# Patient Record
Sex: Female | Born: 1970 | Race: Black or African American | Hispanic: No | Marital: Single | Smoking: Current some day smoker
Health system: Southern US, Community
[De-identification: ages and names within clinical notes are randomized; demographics above are authoritative.]

## PROBLEM LIST (undated history)

## (undated) DIAGNOSIS — T8859XA Other complications of anesthesia, initial encounter: Secondary | ICD-10-CM

## (undated) DIAGNOSIS — F329 Major depressive disorder, single episode, unspecified: Secondary | ICD-10-CM

## (undated) DIAGNOSIS — R011 Cardiac murmur, unspecified: Secondary | ICD-10-CM

## (undated) DIAGNOSIS — IMO0002 Reserved for concepts with insufficient information to code with codable children: Secondary | ICD-10-CM

## (undated) DIAGNOSIS — K219 Gastro-esophageal reflux disease without esophagitis: Secondary | ICD-10-CM

## (undated) DIAGNOSIS — I10 Essential (primary) hypertension: Secondary | ICD-10-CM

## (undated) DIAGNOSIS — C50919 Malignant neoplasm of unspecified site of unspecified female breast: Secondary | ICD-10-CM

## (undated) DIAGNOSIS — C73 Malignant neoplasm of thyroid gland: Secondary | ICD-10-CM

## (undated) DIAGNOSIS — J40 Bronchitis, not specified as acute or chronic: Secondary | ICD-10-CM

## (undated) DIAGNOSIS — D649 Anemia, unspecified: Secondary | ICD-10-CM

## (undated) DIAGNOSIS — D869 Sarcoidosis, unspecified: Secondary | ICD-10-CM

## (undated) DIAGNOSIS — E119 Type 2 diabetes mellitus without complications: Secondary | ICD-10-CM

## (undated) DIAGNOSIS — E7800 Pure hypercholesterolemia, unspecified: Secondary | ICD-10-CM

## (undated) DIAGNOSIS — F419 Anxiety disorder, unspecified: Secondary | ICD-10-CM

## (undated) DIAGNOSIS — T4145XA Adverse effect of unspecified anesthetic, initial encounter: Secondary | ICD-10-CM

## (undated) DIAGNOSIS — IMO0001 Reserved for inherently not codable concepts without codable children: Secondary | ICD-10-CM

## (undated) DIAGNOSIS — F32A Depression, unspecified: Secondary | ICD-10-CM

## (undated) HISTORY — DX: Depression, unspecified: F32.A

## (undated) HISTORY — DX: Anemia, unspecified: D64.9

## (undated) HISTORY — PX: LEEP: SHX91

## (undated) HISTORY — PX: WISDOM TOOTH EXTRACTION: SHX21

## (undated) HISTORY — DX: Major depressive disorder, single episode, unspecified: F32.9

## (undated) HISTORY — DX: Reserved for concepts with insufficient information to code with codable children: IMO0002

## (undated) HISTORY — DX: Reserved for inherently not codable concepts without codable children: IMO0001

## (undated) HISTORY — PX: ABDOMINAL HYSTERECTOMY: SHX81

## (undated) HISTORY — PX: BREAST BIOPSY: SHX20

## (undated) HISTORY — DX: Anxiety disorder, unspecified: F41.9

## (undated) HISTORY — DX: Sarcoidosis, unspecified: D86.9

---

## 1998-02-24 ENCOUNTER — Inpatient Hospital Stay (HOSPITAL_COMMUNITY): Admission: AD | Admit: 1998-02-24 | Discharge: 1998-02-24 | Payer: Self-pay | Admitting: Obstetrics

## 1998-02-25 ENCOUNTER — Ambulatory Visit (HOSPITAL_COMMUNITY): Admission: RE | Admit: 1998-02-25 | Discharge: 1998-02-25 | Payer: Self-pay | Admitting: *Deleted

## 1999-06-06 HISTORY — PX: TUBAL LIGATION: SHX77

## 2000-01-17 ENCOUNTER — Other Ambulatory Visit: Admission: RE | Admit: 2000-01-17 | Discharge: 2000-01-17 | Payer: Self-pay | Admitting: *Deleted

## 2000-06-28 ENCOUNTER — Ambulatory Visit (HOSPITAL_COMMUNITY): Admission: RE | Admit: 2000-06-28 | Discharge: 2000-06-28 | Payer: Self-pay | Admitting: *Deleted

## 2001-02-13 ENCOUNTER — Other Ambulatory Visit: Admission: RE | Admit: 2001-02-13 | Discharge: 2001-02-13 | Payer: Self-pay | Admitting: Family Medicine

## 2001-07-28 ENCOUNTER — Encounter: Payer: Self-pay | Admitting: Emergency Medicine

## 2001-07-28 ENCOUNTER — Emergency Department (HOSPITAL_COMMUNITY): Admission: EM | Admit: 2001-07-28 | Discharge: 2001-07-28 | Payer: Self-pay | Admitting: Emergency Medicine

## 2003-01-23 ENCOUNTER — Emergency Department (HOSPITAL_COMMUNITY): Admission: EM | Admit: 2003-01-23 | Discharge: 2003-01-23 | Payer: Self-pay | Admitting: Emergency Medicine

## 2003-01-23 ENCOUNTER — Encounter: Payer: Self-pay | Admitting: Emergency Medicine

## 2003-07-18 ENCOUNTER — Other Ambulatory Visit: Admission: RE | Admit: 2003-07-18 | Discharge: 2003-07-18 | Payer: Self-pay | Admitting: Family Medicine

## 2004-02-25 ENCOUNTER — Ambulatory Visit: Payer: Self-pay | Admitting: Family Medicine

## 2004-02-26 ENCOUNTER — Ambulatory Visit: Payer: Self-pay | Admitting: Family Medicine

## 2004-03-03 ENCOUNTER — Ambulatory Visit: Payer: Self-pay | Admitting: Family Medicine

## 2004-03-11 ENCOUNTER — Ambulatory Visit: Payer: Self-pay | Admitting: *Deleted

## 2004-06-05 ENCOUNTER — Emergency Department (HOSPITAL_COMMUNITY): Admission: EM | Admit: 2004-06-05 | Discharge: 2004-06-05 | Payer: Self-pay | Admitting: Emergency Medicine

## 2004-06-10 ENCOUNTER — Ambulatory Visit: Payer: Self-pay | Admitting: Family Medicine

## 2004-08-19 ENCOUNTER — Ambulatory Visit: Payer: Self-pay | Admitting: Family Medicine

## 2004-09-13 ENCOUNTER — Ambulatory Visit: Payer: Self-pay | Admitting: Family Medicine

## 2005-04-05 ENCOUNTER — Emergency Department (HOSPITAL_COMMUNITY): Admission: EM | Admit: 2005-04-05 | Discharge: 2005-04-05 | Payer: Self-pay | Admitting: Family Medicine

## 2005-04-05 ENCOUNTER — Ambulatory Visit: Payer: Self-pay | Admitting: Family Medicine

## 2005-04-06 ENCOUNTER — Ambulatory Visit: Payer: Self-pay | Admitting: Family Medicine

## 2005-07-27 ENCOUNTER — Ambulatory Visit: Payer: Self-pay | Admitting: Family Medicine

## 2005-08-30 ENCOUNTER — Ambulatory Visit: Payer: Self-pay | Admitting: Family Medicine

## 2006-01-29 ENCOUNTER — Ambulatory Visit: Payer: Self-pay | Admitting: Family Medicine

## 2006-09-23 ENCOUNTER — Emergency Department (HOSPITAL_COMMUNITY): Admission: EM | Admit: 2006-09-23 | Discharge: 2006-09-23 | Payer: Self-pay | Admitting: Family Medicine

## 2006-10-26 ENCOUNTER — Ambulatory Visit: Payer: Self-pay | Admitting: Internal Medicine

## 2006-10-31 ENCOUNTER — Ambulatory Visit: Payer: Self-pay | Admitting: Family Medicine

## 2007-02-20 ENCOUNTER — Encounter (INDEPENDENT_AMBULATORY_CARE_PROVIDER_SITE_OTHER): Payer: Self-pay | Admitting: *Deleted

## 2007-04-08 ENCOUNTER — Encounter (INDEPENDENT_AMBULATORY_CARE_PROVIDER_SITE_OTHER): Payer: Self-pay | Admitting: Family Medicine

## 2007-04-08 ENCOUNTER — Ambulatory Visit: Payer: Self-pay | Admitting: Internal Medicine

## 2007-04-08 LAB — CONVERTED CEMR LAB
Alkaline Phosphatase: 67 units/L (ref 39–117)
BUN: 17 mg/dL (ref 6–23)
CO2: 23 meq/L (ref 19–32)
Cholesterol: 211 mg/dL — ABNORMAL HIGH (ref 0–200)
Creatinine, Ser: 0.97 mg/dL (ref 0.40–1.20)
Eosinophils Absolute: 0.3 10*3/uL (ref 0.0–0.7)
Eosinophils Relative: 5 % (ref 0–5)
Glucose, Bld: 92 mg/dL (ref 70–99)
HCT: 40.3 % (ref 36.0–46.0)
HDL: 38 mg/dL — ABNORMAL LOW (ref >39)
Hemoglobin: 13.3 g/dL (ref 12.0–15.0)
LDL Cholesterol: 137 mg/dL — ABNORMAL HIGH (ref 0–99)
Lymphs Abs: 1.7 10*3/uL (ref 0.7–3.3)
MCV: 88.8 fL (ref 78.0–100.0)
Monocytes Absolute: 0.6 10*3/uL (ref 0.2–0.7)
Monocytes Relative: 11 % (ref 3–11)
Platelets: 277 10*3/uL (ref 150–400)
RBC: 4.54 M/uL (ref 3.87–5.11)
Total Bilirubin: 0.4 mg/dL (ref 0.3–1.2)
Triglycerides: 180 mg/dL — ABNORMAL HIGH (ref <150)
VLDL: 36 mg/dL (ref 0–40)
WBC: 5.6 10*3/uL (ref 4.0–10.5)

## 2007-11-26 ENCOUNTER — Ambulatory Visit: Payer: Self-pay | Admitting: Internal Medicine

## 2008-03-12 ENCOUNTER — Ambulatory Visit: Payer: Self-pay | Admitting: Internal Medicine

## 2008-09-25 ENCOUNTER — Ambulatory Visit: Payer: Self-pay | Admitting: Internal Medicine

## 2008-11-04 ENCOUNTER — Ambulatory Visit: Payer: Self-pay | Admitting: Internal Medicine

## 2009-03-31 ENCOUNTER — Encounter (INDEPENDENT_AMBULATORY_CARE_PROVIDER_SITE_OTHER): Payer: Self-pay | Admitting: Adult Health

## 2009-03-31 ENCOUNTER — Ambulatory Visit: Payer: Self-pay | Admitting: Internal Medicine

## 2009-03-31 LAB — CONVERTED CEMR LAB
AST: 22 units/L (ref 0–37)
Albumin: 4.2 g/dL (ref 3.5–5.2)
BUN: 15 mg/dL (ref 6–23)
Calcium: 9.4 mg/dL (ref 8.4–10.5)
Chloride: 101 meq/L (ref 96–112)
Cholesterol: 157 mg/dL (ref 0–200)
Creatinine, Ser: 0.95 mg/dL (ref 0.40–1.20)
Eosinophils Absolute: 0.3 10*3/uL (ref 0.0–0.7)
Eosinophils Relative: 4 % (ref 0–5)
Glucose, Bld: 81 mg/dL (ref 70–99)
HCT: 34.1 % — ABNORMAL LOW (ref 36.0–46.0)
HDL: 42 mg/dL (ref >39)
Hemoglobin: 11.2 g/dL — ABNORMAL LOW (ref 12.0–15.0)
Lymphocytes Relative: 35 % (ref 12–46)
Lymphs Abs: 2.1 10*3/uL (ref 0.7–4.0)
MCV: 89.5 fL (ref 78.0–100.0)
Monocytes Absolute: 0.5 10*3/uL (ref 0.1–1.0)
Platelets: 296 10*3/uL (ref 150–400)
Potassium: 3.5 meq/L (ref 3.5–5.3)
RDW: 13.4 % (ref 11.5–15.5)
Total CHOL/HDL Ratio: 3.7
Triglycerides: 125 mg/dL (ref <150)

## 2009-04-08 ENCOUNTER — Ambulatory Visit (HOSPITAL_COMMUNITY): Admission: RE | Admit: 2009-04-08 | Discharge: 2009-04-08 | Payer: Self-pay | Admitting: Internal Medicine

## 2009-04-16 ENCOUNTER — Ambulatory Visit: Payer: Self-pay | Admitting: Internal Medicine

## 2009-05-24 ENCOUNTER — Emergency Department (HOSPITAL_COMMUNITY): Admission: EM | Admit: 2009-05-24 | Discharge: 2009-05-24 | Payer: Self-pay | Admitting: Emergency Medicine

## 2009-07-01 ENCOUNTER — Ambulatory Visit: Payer: Self-pay | Admitting: Internal Medicine

## 2009-07-12 ENCOUNTER — Telehealth (INDEPENDENT_AMBULATORY_CARE_PROVIDER_SITE_OTHER): Payer: Self-pay | Admitting: *Deleted

## 2009-07-13 ENCOUNTER — Encounter (INDEPENDENT_AMBULATORY_CARE_PROVIDER_SITE_OTHER): Payer: Self-pay | Admitting: *Deleted

## 2009-07-28 ENCOUNTER — Ambulatory Visit: Payer: Self-pay | Admitting: Family Medicine

## 2009-09-28 ENCOUNTER — Ambulatory Visit: Payer: Self-pay | Admitting: Internal Medicine

## 2009-09-28 ENCOUNTER — Encounter (INDEPENDENT_AMBULATORY_CARE_PROVIDER_SITE_OTHER): Payer: Self-pay | Admitting: Adult Health

## 2009-09-28 LAB — CONVERTED CEMR LAB
Alkaline Phosphatase: 54 units/L (ref 39–117)
BUN: 13 mg/dL (ref 6–23)
Basophils Absolute: 0 10*3/uL (ref 0.0–0.1)
Basophils Relative: 1 % (ref 0–1)
CO2: 23 meq/L (ref 19–32)
Cholesterol: 168 mg/dL (ref 0–200)
Eosinophils Absolute: 0.2 10*3/uL (ref 0.0–0.7)
Eosinophils Relative: 3 % (ref 0–5)
Glucose, Bld: 90 mg/dL (ref 70–99)
HCT: 35 % — ABNORMAL LOW (ref 36.0–46.0)
HDL: 54 mg/dL (ref >39)
Hemoglobin: 11 g/dL — ABNORMAL LOW (ref 12.0–15.0)
MCHC: 31.4 g/dL (ref 30.0–36.0)
MCV: 92.6 fL (ref 78.0–100.0)
Monocytes Absolute: 0.5 10*3/uL (ref 0.1–1.0)
Monocytes Relative: 7 % (ref 3–12)
Neutro Abs: 4.1 10*3/uL (ref 1.7–7.7)
RBC: 3.78 M/uL — ABNORMAL LOW (ref 3.87–5.11)
RDW: 14 % (ref 11.5–15.5)
Sodium: 139 meq/L (ref 135–145)
Total Bilirubin: 0.2 mg/dL — ABNORMAL LOW (ref 0.3–1.2)
Total Protein: 7.3 g/dL (ref 6.0–8.3)
Triglycerides: 109 mg/dL (ref <150)
VLDL: 22 mg/dL (ref 0–40)

## 2009-09-30 ENCOUNTER — Ambulatory Visit (HOSPITAL_COMMUNITY): Admission: RE | Admit: 2009-09-30 | Discharge: 2009-09-30 | Payer: Self-pay | Admitting: Family Medicine

## 2010-04-06 ENCOUNTER — Encounter (INDEPENDENT_AMBULATORY_CARE_PROVIDER_SITE_OTHER): Payer: Self-pay | Admitting: *Deleted

## 2010-04-06 LAB — CONVERTED CEMR LAB
ALT: 19 units/L (ref 0–35)
BUN: 13 mg/dL (ref 6–23)
Creatinine, Ser: 0.88 mg/dL (ref 0.40–1.20)
Ferritin: 23 ng/mL (ref 10–291)
HCT: 35.8 % — ABNORMAL LOW (ref 36.0–46.0)
Hemoglobin: 11.4 g/dL — ABNORMAL LOW (ref 12.0–15.0)
Hgb A1c MFr Bld: 6.3 % — ABNORMAL HIGH (ref <5.7)
MCHC: 31.8 g/dL (ref 30.0–36.0)
Platelets: 320 10*3/uL (ref 150–400)
RDW: 13.7 % (ref 11.5–15.5)

## 2010-07-05 NOTE — Progress Notes (Signed)
Summary: triage/poss flu  Phone Note Call from Patient   Caller: Patient Reason for Call: Talk to Nurse Summary of Call: patient states she has been sick with sore throat, headache, nausea. She denies vomiting or diarrhea..She states she has a slight cough that is sometimes productive of white mucus otherwise the cough is dry. She says her throat is pink and she has a hx of diabetes and HTN. Her CBG is 151 and isn't sure about a fever. She is taking Alka Laddie Aquas for her s/s.Marland KitchenMarland KitchenShe feels achy and weak.  This all started on Saturday. She says she hasn't eaten today but is drinking liquids.Marland KitchenMarland KitchenAdvised patient the flu is going around and it sounds like this is a virus possible the flu. She can gargle with warm salt water, and drink plenty of fluids...crackers and soup.Marland KitchenMarland KitchenIf she doesn't feel like eating more than that.Marland KitchenMarland KitchenAdvised her to check her CBG's two times a day as she usually does and to call me tomorrow to let me know how she is doing. Should s/s worsen we will bring her in for an appointment. Initial call taken by: Conchita Paris,  July 12, 2009 6:05 PM     Appended Document: triage/poss flu call from patient, states is not feeling any better. She is unable to check her temp secondary to no thermometer. Advised patient that with influenza, which she is displaying symptoms up, the recovery or improvement period is upwards of 5-7 days, with flu shot (since usually have milder version). No resp distress, continues to have generalized muscle aches, fatigue, cough, congestion, ha, feels as if has fever with chills. Advised to take IBU 400 mg q 6 hours for fever, can alternate with Tylenol. She states that was taking Leota Sauers which was helping with upper resp symptoms, not fever. Advised that Alka [name] has ASA in it, really need to take IBU or Tylenol to help with fever, and IBU will help with muscle aches. Also suggest tsp of honey for throat pain, in addition to gargles and sugar free lozangers.  Advised can use Robatussin sugar free cough syrup. Push fluids, limit contact, good hand hygiene. Advised if continue to feel febrile past 4 days, or coughing and producing colored phlem, call office so can schedule apt. To call with any change in symptoms. Needs note for work, note written to be out through 07/18/09. Note faxed to Saunders Revel or Commeka Arnold's attn at [phone]. 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

## 2010-07-05 NOTE — Letter (Signed)
Summary: Out of Work  [employer]  [address]   [place], [place] [address]   Phone: [phone]  Fax: [phone]    July 13, 2009   Employee:  Shelly Hall    To Whom It May Concern:   For Medical reasons, please excuse the above named employee from work for the following dates:  Start:   07-12-09  End:   07-18-09 (may be extended based on symptoms)  If you need additional information, please feel free to contact our office.         Sincerely,    Blondell Reveal RN

## 2010-08-24 ENCOUNTER — Inpatient Hospital Stay (INDEPENDENT_AMBULATORY_CARE_PROVIDER_SITE_OTHER)
Admission: RE | Admit: 2010-08-24 | Discharge: 2010-08-24 | Disposition: A | Discharge status: Home / Self Care | Payer: Self-pay | Source: Ambulatory Visit | Attending: Family Medicine | Admitting: Family Medicine

## 2010-08-24 ENCOUNTER — Ambulatory Visit (INDEPENDENT_AMBULATORY_CARE_PROVIDER_SITE_OTHER): Payer: Self-pay

## 2010-08-24 DIAGNOSIS — J40 Bronchitis, not specified as acute or chronic: Secondary | ICD-10-CM

## 2010-10-21 NOTE — Op Note (Signed)
[facility]  Patient:    Shelly Hall, Shelly Hall                   MRN: [id number] Proc. Date: 06/28/00 Attending:  Deniece Ree, M.D.                           Operative Report  PREOPERATIVE DIAGNOSES:       Multiparity, desire for permanent sterilization.  POSTOPERATIVE DIAGNOSES:      Multiparity, desire for permanent sterilization.  OPERATION:                    Laparoscopic bilateral tubal cauterization with tubal resection.  SURGEON:                      Deniece Ree, M.D.  ANESTHESIA:                   General.  ESTIMATED BLOOD LOSS:         Less than 25 cc.  COMPLICATIONS:                None.                                Patient tolerated the procedure well and returned to the recovery room in satisfactory condition.  PROCEDURE:                    Patient was taken to the operating room and prepped and draped in the usual fashion for laparoscopic surgery.  Speculum was placed in the vagina following which the anterior lip of the cervix was grasped with a Christella Hartigan tenaculum.  The acorn uterine manipulating cannula then put into place.  A subumbilical incision was then made following which the Verres needle was introduced in this incision and approximately 3 L of carbon dioxide was introduced without any difficulty.  The laparoscopic Trocar was then used to enter the abdominal cavity following which the laparoscope was placed through its sleeve.  Visualization of the pelvic organs came into view.  Uterus, tubes, and ovaries all appeared to be within normal limits. The right tube was then identified, grasped approximately 5 mm proximal to the right cornu and cauterized approximately 3-4 cm.  This was done likewise on the opposite size.  Following this cauterized segments of tube were then cut into two locations.  At this point hemostasis remained present.  Sponge and needle count was correct x 2.  The carbon dioxide was then allowed to  escape from the abdominal cavity ______ without any problems.  The incision was then closed, first with a deep interrupted stitch followed by a subcuticular stitch using 4-0 Vicryl.  Procedure then terminated.  Patient tolerated procedure well and returned to the recovery room in satisfactory condition.  Patient is to be discharged when fully alert.  She has been instructed on the possible complications and care following this type of surgery.  She is told to return to my office in four weeks for followup evaluation or to call me prior to that time should any problems arise. DD:  06/28/00 TD:  06/28/00 Job: [id number]

## 2011-02-19 ENCOUNTER — Emergency Department (HOSPITAL_COMMUNITY): Payer: Self-pay

## 2011-02-19 ENCOUNTER — Emergency Department (HOSPITAL_COMMUNITY)
Admission: EM | Admit: 2011-02-19 | Discharge: 2011-02-19 | Disposition: A | Discharge status: Home / Self Care | Payer: Self-pay | Attending: Emergency Medicine | Admitting: Emergency Medicine

## 2011-02-19 DIAGNOSIS — T1490XA Injury, unspecified, initial encounter: Secondary | ICD-10-CM | POA: Insufficient documentation

## 2011-02-19 DIAGNOSIS — S5010XA Contusion of unspecified forearm, initial encounter: Secondary | ICD-10-CM | POA: Insufficient documentation

## 2011-02-19 DIAGNOSIS — R220 Localized swelling, mass and lump, head: Secondary | ICD-10-CM | POA: Insufficient documentation

## 2011-02-19 DIAGNOSIS — I10 Essential (primary) hypertension: Secondary | ICD-10-CM | POA: Insufficient documentation

## 2011-02-19 DIAGNOSIS — E119 Type 2 diabetes mellitus without complications: Secondary | ICD-10-CM | POA: Insufficient documentation

## 2012-01-03 ENCOUNTER — Encounter (HOSPITAL_COMMUNITY): Payer: Self-pay | Admitting: Emergency Medicine

## 2012-01-03 ENCOUNTER — Other Ambulatory Visit: Payer: Self-pay

## 2012-01-03 DIAGNOSIS — I10 Essential (primary) hypertension: Secondary | ICD-10-CM | POA: Insufficient documentation

## 2012-01-03 DIAGNOSIS — F17200 Nicotine dependence, unspecified, uncomplicated: Secondary | ICD-10-CM | POA: Insufficient documentation

## 2012-01-03 DIAGNOSIS — E876 Hypokalemia: Secondary | ICD-10-CM | POA: Insufficient documentation

## 2012-01-03 DIAGNOSIS — E119 Type 2 diabetes mellitus without complications: Secondary | ICD-10-CM | POA: Insufficient documentation

## 2012-01-03 LAB — CBC WITH DIFFERENTIAL/PLATELET
Basophils Absolute: 0 10*3/uL (ref 0.0–0.1)
Basophils Relative: 0 % (ref 0–1)
Eosinophils Absolute: 0.3 10*3/uL (ref 0.0–0.7)
MCH: 27.6 pg (ref 26.0–34.0)
MCHC: 32.6 g/dL (ref 30.0–36.0)
Monocytes Relative: 8 % (ref 3–12)
Neutrophils Relative %: 57 % (ref 43–77)
Platelets: 354 10*3/uL (ref 150–400)
RDW: 15.5 % (ref 11.5–15.5)

## 2012-01-03 LAB — BASIC METABOLIC PANEL
BUN: 14 mg/dL (ref 6–23)
Calcium: 9.3 mg/dL (ref 8.4–10.5)
Creatinine, Ser: 1.05 mg/dL (ref 0.50–1.10)
GFR calc non Af Amer: 65 mL/min — ABNORMAL LOW (ref >90)
Glucose, Bld: 110 mg/dL — ABNORMAL HIGH (ref 70–99)

## 2012-01-03 LAB — GLUCOSE, CAPILLARY: Glucose-Capillary: 106 mg/dL — ABNORMAL HIGH (ref 70–99)

## 2012-01-03 NOTE — ED Notes (Signed)
BP reduced only slightly.  Pt states she continues to feel dizzy.

## 2012-01-03 NOTE — ED Notes (Signed)
PT. REPORTS LOW BLOOD SUGAR AT HOME THIS EVENING WITH GENERALIZED WEAKNESS / DIZZINESS.

## 2012-01-04 ENCOUNTER — Emergency Department (HOSPITAL_COMMUNITY)
Admission: EM | Admit: 2012-01-04 | Discharge: 2012-01-04 | Disposition: A | Discharge status: Home / Self Care | Payer: Self-pay | Attending: Emergency Medicine | Admitting: Emergency Medicine

## 2012-01-04 DIAGNOSIS — E876 Hypokalemia: Secondary | ICD-10-CM

## 2012-01-04 DIAGNOSIS — I160 Hypertensive urgency: Secondary | ICD-10-CM

## 2012-01-04 HISTORY — DX: Essential (primary) hypertension: I10

## 2012-01-04 LAB — GLUCOSE, CAPILLARY: Glucose-Capillary: 98 mg/dL (ref 70–99)

## 2012-01-04 LAB — URINE MICROSCOPIC-ADD ON

## 2012-01-04 LAB — URINALYSIS, ROUTINE W REFLEX MICROSCOPIC
Bilirubin Urine: NEGATIVE
Ketones, ur: NEGATIVE mg/dL
Nitrite: NEGATIVE
pH: 6.5 (ref 5.0–8.0)

## 2012-01-04 LAB — LIPASE, BLOOD: Lipase: 35 U/L (ref 11–59)

## 2012-01-04 LAB — HEPATIC FUNCTION PANEL
Albumin: 4 g/dL (ref 3.5–5.2)
Alkaline Phosphatase: 77 U/L (ref 39–117)
Total Bilirubin: 0.1 mg/dL — ABNORMAL LOW (ref 0.3–1.2)

## 2012-01-04 MED ORDER — ATENOLOL 50 MG PO TABS: 50.0000 mg | ORAL_TABLET | Freq: Every day | ORAL | Status: DC

## 2012-01-04 MED ORDER — LABETALOL HCL 5 MG/ML IV SOLN
20.0000 mg | Freq: Once | INTRAVENOUS | Status: AC
  Administered 2012-01-04: 20 mg via INTRAVENOUS
  Filled 2012-01-04: qty 4

## 2012-01-04 NOTE — ED Notes (Signed)
The patient is AOx4 and comfortable with her discharge instructions. 

## 2012-01-04 NOTE — ED Provider Notes (Signed)
History     CSN: [id number]  Arrival date & time 01/03/12  2123   First MD Initiated Contact with Patient 01/04/12 0102      Chief Complaint  Patient presents with  . Hypoglycemia    HPI  This 41 year old female presents with multiple complaints.  She notes over the past days to a week she has felt generally unwell, with migratory areas of concern.  She Associates intermittently had headache, abdominal pain, chest pain, dyspnea, extremity discomfort, nausea, disequilibrium.  Today, after awakening, feeling as though her symptoms were worsening, and she was more fatigued, she was concerned about her low blood sugar, and now presents for evaluation. Throughout this time.  She has continued to take her medication, except atenolol.  There are no clear exacerbating or alleviating factors.  Past Medical History  Diagnosis Date  . Diabetes mellitus   . Hypertension     Past Surgical History  Procedure Date  . Uterine fibroid surgery     No family history on file.  History  Substance Use Topics  . Smoking status: Current Everyday Smoker  . Smokeless tobacco: Not on file  . Alcohol Use: Yes    OB History    Grav Para Term Preterm Abortions TAB SAB Ect Mult Living                  Review of Systems  Constitutional:       HPI  HENT:       HPI otherwise negative  Eyes: Negative.   Respiratory:       HPI, otherwise negative  Cardiovascular:       HPI, otherwise nmegative  Gastrointestinal: Negative for vomiting.  Genitourinary:       HPI, otherwise negative  Musculoskeletal:       HPI, otherwise negative  Skin: Negative.   Neurological: Negative for syncope.    Allergies  Review of patient's allergies indicates no known allergies.  Home Medications   Current Outpatient Rx  Name Route Sig Dispense Refill  . ALBUTEROL SULFATE HFA 108 (90 BASE) MCG/ACT IN AERS Inhalation Inhale 2 puffs into the lungs every 6 (six) hours as needed. For bronchitis flare ups    .  ASPIRIN EC 81 MG PO TBEC Oral Take 81 mg by mouth daily.    Marland Kitchen METFORMIN HCL 500 MG PO TABS Oral Take 500 mg by mouth 2 (two) times daily with a meal.    . TRAZODONE HCL 100 MG PO TABS Oral Take 100 mg by mouth at bedtime as needed. sleep    . ATENOLOL 50 MG PO TABS Oral Take 1 tablet (50 mg total) by mouth daily. 30 tablet 0    BP 169/103  Pulse 71  Temp 98.3 F (36.8 C) (Oral)  Resp 16  SpO2 99%  LMP 12/25/2011  Physical Exam  Nursing note and vitals reviewed. Constitutional: She is oriented to person, place, and time. She appears well-developed and well-nourished. No distress.  HENT:  Head: Normocephalic and atraumatic.  Eyes: Conjunctivae and EOM are normal.  Cardiovascular: Normal rate and regular rhythm.   Pulmonary/Chest: Effort normal and breath sounds normal. No stridor. No respiratory distress.  Abdominal: She exhibits no distension.  Musculoskeletal: She exhibits no edema.  Neurological: She is alert and oriented to person, place, and time. No cranial nerve deficit.  Skin: Skin is warm and dry.  Psychiatric: She has a normal mood and affect.    ED Course  Procedures (including critical care time)  Labs Reviewed  BASIC METABOLIC PANEL - Abnormal; Notable for the following:    Potassium 3.4 (*)     Glucose, Bld 110 (*)     GFR calc non Af Amer 65 (*)     GFR calc Af Amer 75 (*)     All other components within normal limits  CBC WITH DIFFERENTIAL - Abnormal; Notable for the following:    RBC 3.84 (*)     Hemoglobin 10.6 (*)     HCT 32.5 (*)     All other components within normal limits  GLUCOSE, CAPILLARY - Abnormal; Notable for the following:    Glucose-Capillary 106 (*)     All other components within normal limits  HEPATIC FUNCTION PANEL - Abnormal; Notable for the following:    Total Bilirubin 0.1 (*)     All other components within normal limits  URINALYSIS, ROUTINE W REFLEX MICROSCOPIC - Abnormal; Notable for the following:    APPearance CLOUDY (*)      Hgb urine dipstick LARGE (*)     All other components within normal limits  URINE MICROSCOPIC-ADD ON - Abnormal; Notable for the following:    Squamous Epithelial / LPF MANY (*)     Bacteria, UA FEW (*)     All other components within normal limits  GLUCOSE, CAPILLARY  LIPASE, BLOOD  POCT PREGNANCY, URINE   No results found.   1. Hypokalemia   2. Hypertensive urgency       MDM  This well-appearing 41 year old female now presents with multiple complaints and concern of hypoglycemia.  On exam the patient is notably hypertensive, but in no distress, with an unremarkable physical exam.  Given the patient's description of medication noncompliance, she had labs evaluated for hypertensive urgency.  These were largely reassuring, though the patient was hypokalemic.  The patient's potassium was repleted, and following a dose of beta blocker, her blood pressure decreased 20%.  With this decrease, she was discharged in stable condition to resume her atenolol.  Absent acute complaints, or other findings, and with improving vital signs, she was stable for discharge.  She was counseled on the need for continued primary care management of her condition.     Gerhard Munch, MD 01/04/12 [phone]

## 2012-06-05 DIAGNOSIS — J40 Bronchitis, not specified as acute or chronic: Secondary | ICD-10-CM

## 2012-06-05 HISTORY — DX: Bronchitis, not specified as acute or chronic: J40

## 2012-06-05 HISTORY — PX: LUNG BIOPSY: SHX5088

## 2012-10-28 ENCOUNTER — Encounter (HOSPITAL_COMMUNITY): Payer: Self-pay | Admitting: Emergency Medicine

## 2012-10-28 ENCOUNTER — Emergency Department (INDEPENDENT_AMBULATORY_CARE_PROVIDER_SITE_OTHER): Payer: Self-pay

## 2012-10-28 ENCOUNTER — Emergency Department (HOSPITAL_COMMUNITY): Payer: Self-pay

## 2012-10-28 ENCOUNTER — Emergency Department (HOSPITAL_COMMUNITY)
Admission: EM | Admit: 2012-10-28 | Discharge: 2012-10-29 | Disposition: A | Discharge status: Home / Self Care | Payer: Self-pay | Attending: Emergency Medicine | Admitting: Emergency Medicine

## 2012-10-28 ENCOUNTER — Emergency Department (INDEPENDENT_AMBULATORY_CARE_PROVIDER_SITE_OTHER)
Admission: EM | Admit: 2012-10-28 | Discharge: 2012-10-28 | Disposition: A | Discharge status: Other Acute Inpatient Hospital | Payer: Self-pay | Source: Home / Self Care | Attending: Emergency Medicine | Admitting: Emergency Medicine

## 2012-10-28 ENCOUNTER — Encounter (HOSPITAL_COMMUNITY): Payer: Self-pay | Admitting: Adult Health

## 2012-10-28 DIAGNOSIS — H6592 Unspecified nonsuppurative otitis media, left ear: Secondary | ICD-10-CM

## 2012-10-28 DIAGNOSIS — D860 Sarcoidosis of lung: Secondary | ICD-10-CM

## 2012-10-28 DIAGNOSIS — E119 Type 2 diabetes mellitus without complications: Secondary | ICD-10-CM

## 2012-10-28 DIAGNOSIS — R599 Enlarged lymph nodes, unspecified: Secondary | ICD-10-CM

## 2012-10-28 DIAGNOSIS — H6590 Unspecified nonsuppurative otitis media, unspecified ear: Secondary | ICD-10-CM

## 2012-10-28 DIAGNOSIS — D869 Sarcoidosis, unspecified: Secondary | ICD-10-CM | POA: Insufficient documentation

## 2012-10-28 DIAGNOSIS — R590 Localized enlarged lymph nodes: Secondary | ICD-10-CM

## 2012-10-28 DIAGNOSIS — F17200 Nicotine dependence, unspecified, uncomplicated: Secondary | ICD-10-CM | POA: Insufficient documentation

## 2012-10-28 DIAGNOSIS — Z79899 Other long term (current) drug therapy: Secondary | ICD-10-CM | POA: Insufficient documentation

## 2012-10-28 DIAGNOSIS — I10 Essential (primary) hypertension: Secondary | ICD-10-CM | POA: Insufficient documentation

## 2012-10-28 DIAGNOSIS — Z7982 Long term (current) use of aspirin: Secondary | ICD-10-CM | POA: Insufficient documentation

## 2012-10-28 DIAGNOSIS — Z792 Long term (current) use of antibiotics: Secondary | ICD-10-CM | POA: Insufficient documentation

## 2012-10-28 LAB — BASIC METABOLIC PANEL
GFR calc Af Amer: >90 mL/min (ref >90)
GFR calc non Af Amer: >90 mL/min (ref >90)
Potassium: 3.1 mEq/L — ABNORMAL LOW (ref 3.5–5.1)
Sodium: 138 mEq/L (ref 135–145)

## 2012-10-28 LAB — CBC
MCHC: 31.5 g/dL (ref 30.0–36.0)
Platelets: 296 10*3/uL (ref 150–400)
RDW: 19.4 % — ABNORMAL HIGH (ref 11.5–15.5)

## 2012-10-28 LAB — GLUCOSE, CAPILLARY: Glucose-Capillary: 82 mg/dL (ref 70–99)

## 2012-10-28 MED ORDER — ALBUTEROL SULFATE (5 MG/ML) 0.5% IN NEBU
5.0000 mg | INHALATION_SOLUTION | Freq: Once | RESPIRATORY_TRACT | Status: AC
  Administered 2012-10-28: 5 mg via RESPIRATORY_TRACT

## 2012-10-28 MED ORDER — ATENOLOL-CHLORTHALIDONE 50-25 MG PO TABS: 1.0000 | ORAL_TABLET | Freq: Every day | ORAL | Status: DC

## 2012-10-28 MED ORDER — ALBUTEROL (5 MG/ML) CONTINUOUS INHALATION SOLN
10.0000 mg/h | INHALATION_SOLUTION | RESPIRATORY_TRACT | Status: AC
  Administered 2012-10-28: 10 mg/h via RESPIRATORY_TRACT
  Filled 2012-10-28: qty 20

## 2012-10-28 MED ORDER — METFORMIN HCL 500 MG PO TABS: 500.0000 mg | ORAL_TABLET | Freq: Two times a day (BID) | ORAL | Status: DC

## 2012-10-28 MED ORDER — IOHEXOL 300 MG/ML  SOLN
80.0000 mL | Freq: Once | INTRAMUSCULAR | Status: AC | PRN
  Administered 2012-10-28: 80 mL via INTRAVENOUS

## 2012-10-28 MED ORDER — ALBUTEROL SULFATE (5 MG/ML) 0.5% IN NEBU
INHALATION_SOLUTION | RESPIRATORY_TRACT | Status: AC
  Filled 2012-10-28: qty 1

## 2012-10-28 MED ORDER — AMOXICILLIN 500 MG PO CAPS: 1000.0000 mg | ORAL_CAPSULE | Freq: Three times a day (TID) | ORAL | Status: DC

## 2012-10-28 MED ORDER — IPRATROPIUM BROMIDE 0.02 % IN SOLN
0.5000 mg | Freq: Once | RESPIRATORY_TRACT | Status: AC
  Administered 2012-10-28: 0.5 mg via RESPIRATORY_TRACT

## 2012-10-28 MED ORDER — ALBUTEROL SULFATE HFA 108 (90 BASE) MCG/ACT IN AERS: 1.0000 | INHALATION_SPRAY | Freq: Four times a day (QID) | RESPIRATORY_TRACT | Status: DC | PRN

## 2012-10-28 NOTE — ED Provider Notes (Signed)
History     CSN: [id number]  Arrival date & time 10/28/12  4098   First MD Initiated Contact with Patient 10/28/12 2206      Chief Complaint  Patient presents with  . Shortness of Breath    (Consider location/radiation/quality/duration/timing/severity/associated sxs/prior treatment) HPI Comments: Patient is a 42 year old female with a past medical history of diabetes and hypertension who presents with a 10 day history of SOB. Symptoms started gradually and progressively worsened since the onset. Patient reports associated ear congestion and productive cough with yellow/green sputum. Patient has not tried anything for symptoms. No aggravating/alleviating factors. Patient has a 10 pack year history. Patient denies associated symptoms such as fever and chest pain. She was sent here from urgent care for a hilar mass noted on her chest xray today. Patient was sent for a CT chest to further evaluate the findings.    Past Medical History  Diagnosis Date  . Diabetes mellitus   . Hypertension     Past Surgical History  Procedure Laterality Date  . Uterine fibroid surgery      History reviewed. No pertinent family history.  History  Substance Use Topics  . Smoking status: Current Every Day Smoker  . Smokeless tobacco: Not on file  . Alcohol Use: Yes    OB History   Grav Para Term Preterm Abortions TAB SAB Ect Mult Living                  Review of Systems  Respiratory: Positive for cough and shortness of breath.   All other systems reviewed and are negative.    Allergies  Review of patient's allergies indicates no known allergies.  Home Medications   Current Outpatient Rx  Name  Route  Sig  Dispense  Refill  . albuterol (PROVENTIL HFA;VENTOLIN HFA) 108 (90 BASE) MCG/ACT inhaler   Inhalation   Inhale 2 puffs into the lungs every 6 (six) hours as needed. For bronchitis flare ups         . albuterol (PROVENTIL HFA;VENTOLIN HFA) 108 (90 BASE) MCG/ACT inhaler  Inhalation   Inhale 1-2 puffs into the lungs every 6 (six) hours as needed for wheezing.   1 Inhaler   0   . amoxicillin (AMOXIL) 500 MG capsule   Oral   Take 2 capsules (1,000 mg total) by mouth 3 (three) times daily.   60 capsule   0     Dispense as written.   Marland Kitchen aspirin EC 81 MG tablet   Oral   Take 81 mg by mouth daily.         Marland Kitchen atenolol (TENORMIN) 50 MG tablet   Oral   Take 1 tablet (50 mg total) by mouth daily.   30 tablet   0   . atenolol-chlorthalidone (TENORETIC) 50-25 MG per tablet   Oral   Take 1 tablet by mouth daily.   30 tablet   2   . metFORMIN (GLUCOPHAGE) 500 MG tablet   Oral   Take 500 mg by mouth 2 (two) times daily with a meal.         . metFORMIN (GLUCOPHAGE) 500 MG tablet   Oral   Take 1 tablet (500 mg total) by mouth 2 (two) times daily with a meal.   60 tablet   2   . traZODone (DESYREL) 100 MG tablet   Oral   Take 100 mg by mouth at bedtime as needed. sleep  BP 186/98  Pulse 86  Temp(Src) 98.6 F (37 C) (Oral)  Resp 14  SpO2 100%  LMP 10/05/2012  Physical Exam  Nursing note and vitals reviewed. Constitutional: She is oriented to person, place, and time. She appears well-developed and well-nourished. No distress.  Patient intermittently coughing throughout exam and interview  HENT:  Head: Normocephalic and atraumatic.  Eyes: Conjunctivae and EOM are normal.  Neck: Normal range of motion.  Cardiovascular: Normal rate and regular rhythm.  Exam reveals no gallop and no friction rub.   No murmur heard. Pulmonary/Chest: Effort normal and breath sounds normal. She has no wheezes. She has no rales. She exhibits no tenderness.  Abdominal: Soft. There is no tenderness.  Musculoskeletal: Normal range of motion.  Neurological: She is alert and oriented to person, place, and time. Coordination normal.  Speech is goal-oriented. Moves limbs without ataxia.   Skin: Skin is warm and dry.  Psychiatric: She has a normal mood  and affect. Her behavior is normal.    ED Course  Procedures (including critical care time)  Labs Reviewed  CBC - Abnormal; Notable for the following:    Hemoglobin 10.0 (*)    HCT 31.7 (*)    MCV 75.8 (*)    MCH 23.9 (*)    RDW 19.4 (*)    All other components within normal limits  BASIC METABOLIC PANEL - Abnormal; Notable for the following:    Potassium 3.1 (*)    All other components within normal limits   Dg Chest 2 View  10/28/2012   *RADIOLOGY REPORT*  Clinical Data: Productive cough for 3 weeks.  CHEST - 2 VIEW  Comparison: 08/24/2010  Findings: The heart is upper limits normal in size.  There is prominence of right hilar soft tissue, raising question of adenopathy or mass. There is density in the right paratracheal region, raising the question of right paratracheal adenopathy. Possible enlarged left hilar lymph nodes.  There are no focal consolidations.  No pleural effusions.  IMPRESSION: Right hilar mass.  Suspect bilateral hilar adenopathy. Possible right paratracheal adenopathy.  Findings raise the question of sarcoidosis.  Further evaluation with CT of the chest with contrast is recommended.   Original Report Authenticated By: Norva Pavlov, M.D.     1. Sarcoidosis of lung       MDM  10:07 PM Labs pending. Patient will have a CT chest to further evaluate hilar mass as recommended.   1:41 AM CT consistent with sarcoidosis. Patient will be discharged with recommended Pulmonology follow up. Patient reports improved breathing with albuterol nebulizer given here. Patient will have atenolol and metformin refilled here as well. Vitals stable and patient afebrile. Patient will be discharged with instructions to return with worsening or concerning symptoms.      Emilia Beck, [place] 10/29/12 [phone]

## 2012-10-28 NOTE — ED Notes (Signed)
Pt  Reports  Symptoms  Of  Cough  /  Congested   As  Well     As   Sensation  Of    Decreased      Hearing     In the  l  Ear             With a  Sensation of  Pressure   She  Is  Allso  Out  Of  Her   meds  For  Diabetes  And     htn

## 2012-10-28 NOTE — ED Notes (Signed)
Presents from [facility], with abnormal chest xray, sent here for further evaluation and CT of chest. Pt reports SOB, cough and bilateral ear pain for 2 weeks. Pt thought she had caught a virus. SAts 100%. Pt is hypertensive 186/109. Denies pain.

## 2012-10-28 NOTE — ED Provider Notes (Signed)
42 year old female who presents from the urgent care after being seen for shortness of breath and had an x-ray that showed some perihilar lymphadenopathy. She describes approximately 3 weeks of shortness of breath and a cough which is sometimes productive. She denies fevers but does have occasional chills, no significant weight loss, swelling, rashes or any other lumps bumps or bruises to her body. She does work in the [employer] and notes that someone who had an upper respiratory infection cough on her face prior to these last 3 weeks.  On my exam she does have occasional slight rales that clear with deep breathing, no wheezing, no increased work of breathing, no accessory muscle use, speaks in full sentences, no peripheral edema, soft heart murmur but otherwise no tachycardia.  I personally reviewed and interpreted her x-ray and there does appear to be a slight perihilar fullness. She will get a CT scan with contrast to further evaluate.   Medical screening examination/treatment/procedure(s) were conducted as a shared visit with non-physician practitioner(s) and myself.  I personally evaluated the patient during the encounter    Vida Roller, MD 10/28/12 2321

## 2012-10-28 NOTE — ED Provider Notes (Signed)
Chief Complaint:   Chief Complaint  Patient presents with  . URI    History of Present Illness:   Shelly Hall is a 42 year old female with diabetes and hypertension who presents tonight with a three-week history of cough productive yellow-green sputum and wheezing. She denies any history of asthma but does smoke about 3-4 cigarettes per day. She also notes shortness of breath and heaviness in her chest. Both ears have felt congested, left worse than right without any ear pain. She's felt chilled, had sore throat, had nasal congestion, headache, and rhinorrhea which has been yellowish green. She's had some posttussive vomiting. She has diabetes and hypertension but has been out of her medications for over a month. She normally takes atenolol/chlorthalidone and metformin 500 mg twice a day. She does not have a primary care physician right now.  Review of Systems:  Other than noted above, the patient denies any of the following symptoms: Systemic:  No fevers, chills, sweats, weight loss or gain, or fatigue. ENT:  No nasal congestion, sneezing, itching, postnasal drip, sinus pressure, headache, sore throat, or hoarseness. Lungs:  No wheezing, shortness of breath, chest tightness or congestion. Heart:  No chest pain, tightness, pressure, PND, orthopnea, or ankle edema. GI:  No indigestion, heartburn, waterbrash, burping, abdominal pain, nausea, or vomiting.  PMFSH:  Past medical history, family history, social history, meds, and allergies were reviewed.  Specifically, there is no history of asthma, allergies, or reflux esophagitis.  Physical Exam:   Vital signs:  BP 182/116  Pulse 83  Temp(Src) 99 F (37.2 C) (Oral)  Resp 18  SpO2 100%  LMP 10/05/2012 General:  Alert and oriented.  In no distress.  Skin warm and dry. ENT: The right and TM and canal were normal, she has an air-fluid level behind her left TM without any redness or inflammation.  Nasal mucosa is markedly congested bilaterally,  without drainage.  Pharynx clear without exudate or drainage.  No intraoral lesions. Neck:  No adenopathy, tenderness or mass.  No JVD. Lungs:  No respiratory distress.  Breath sounds clear and equal bilaterally.  No wheezes, rales or rhonchi. Heart:  Regular rhythm, no gallops or murmers.  No pedal edema. Abdomon:  Soft and nontender.  No organomegaly or mass.  Results for orders placed during the hospital encounter of 10/28/12  GLUCOSE, CAPILLARY      Result Value Range   Glucose-Capillary 82  70 - 99 mg/dL   Radiology:  Dg Chest 2 View  10/28/2012   *RADIOLOGY REPORT*  Clinical Data: Productive cough for 3 weeks.  CHEST - 2 VIEW  Comparison: 08/24/2010  Findings: The heart is upper limits normal in size.  There is prominence of right hilar soft tissue, raising question of adenopathy or mass. There is density in the right paratracheal region, raising the question of right paratracheal adenopathy. Possible enlarged left hilar lymph nodes.  There are no focal consolidations.  No pleural effusions.  IMPRESSION: Right hilar mass.  Suspect bilateral hilar adenopathy. Possible right paratracheal adenopathy.  Findings raise the question of sarcoidosis.  Further evaluation with CT of the chest with contrast is recommended.   Original Report Authenticated By: Norva Pavlov, M.D.      Course in Urgent Care Center:   She was given a DuoNeb breathing treatment and after that felt better. Her lungs sounded clear afterwards as they did before.   Assessment:  The primary encounter diagnosis was Hilar adenopathy. Diagnoses of Otitis media with effusion, left, Hypertension, and Type  2 diabetes mellitus were also pertinent to this visit.  Many of her symptoms may be due to what is causing her hilar lymphadenopathy which is most likely sarcoidosis, especially the cough, wheezing, shortness of breath, chest heaviness. I think she also has a viral upper respiratory infection causing the nasal congestion and  serous otitis media. Additionally, she is out of all of her medications and I have refilled her metformin, a Campbell/chlorthalidone, and given her a prescription for amoxicillin and an albuterol inhaler. Right now she needs a chest CT scan to further delineate the cause of her hilar lymphadenopathy.  Plan:   1.  The following meds were prescribed:   New Prescriptions   ALBUTEROL (PROVENTIL HFA;VENTOLIN HFA) 108 (90 BASE) MCG/ACT INHALER    Inhale 1-2 puffs into the lungs every 6 (six) hours as needed for wheezing.   AMOXICILLIN (AMOXIL) 500 MG CAPSULE    Take 2 capsules (1,000 mg total) by mouth 3 (three) times daily.   ATENOLOL-CHLORTHALIDONE (TENORETIC) 50-25 MG PER TABLET    Take 1 tablet by mouth daily.   METFORMIN (GLUCOPHAGE) 500 MG TABLET    Take 1 tablet (500 mg total) by mouth 2 (two) times daily with a meal.   2.  The patient was transferred to the emergency department via shuttle in stable condition. I informed her about the hilar lymphadenopathy and let her know the most likely cause for this is sarcoidosis.   Reuben Likes, MD 10/28/12 [phone]

## 2012-10-28 NOTE — ED Notes (Signed)
NURSE FIRST ROUNDS : NURSE EXPLAINED DELAY , WAIT TIME , AND PROCESS , RESPIRATIONS UNLABORED , DENIES PAIN AT THIS TIME .

## 2012-10-28 NOTE — ED Notes (Signed)
Chart review.

## 2012-10-29 MED ORDER — METFORMIN HCL 500 MG PO TABS: 500.0000 mg | ORAL_TABLET | Freq: Two times a day (BID) | ORAL | Status: DC

## 2012-10-29 MED ORDER — ATENOLOL-CHLORTHALIDONE 50-25 MG PO TABS: 1.0000 | ORAL_TABLET | Freq: Every day | ORAL | Status: DC

## 2012-10-29 MED ORDER — ATENOLOL 25 MG PO TABS
50.0000 mg | ORAL_TABLET | Freq: Once | ORAL | Status: AC
  Administered 2012-10-29: 50 mg via ORAL
  Filled 2012-10-29: qty 2

## 2012-10-29 NOTE — ED Provider Notes (Signed)
Medical screening examination/treatment/procedure(s) were conducted as a shared visit with non-physician practitioner(s) and myself.  I personally evaluated the patient during the encounter  Please see my separate respective documentation pertaining to this patient encounter   Vida Roller, MD 10/29/12 [phone]

## 2012-10-29 NOTE — ED Notes (Signed)
Patients respiratory status has improved, chest tightness has improved

## 2012-11-21 ENCOUNTER — Encounter: Payer: Self-pay | Admitting: Emergency Medicine

## 2012-11-21 ENCOUNTER — Ambulatory Visit (INDEPENDENT_AMBULATORY_CARE_PROVIDER_SITE_OTHER): Payer: BC Managed Care – PPO | Admitting: Emergency Medicine

## 2012-11-21 VITALS — BP 122/90 | HR 63 | Temp 98.0°F | Ht 64.0 in | Wt 195.2 lb

## 2012-11-21 DIAGNOSIS — F17200 Nicotine dependence, unspecified, uncomplicated: Secondary | ICD-10-CM

## 2012-11-21 DIAGNOSIS — R590 Localized enlarged lymph nodes: Secondary | ICD-10-CM | POA: Insufficient documentation

## 2012-11-21 DIAGNOSIS — R599 Enlarged lymph nodes, unspecified: Secondary | ICD-10-CM

## 2012-11-21 MED ORDER — BUPROPION HCL ER (SR) 150 MG PO TB12
ORAL_TABLET | ORAL | Status: DC
Start: 2012-11-21 — End: 2012-11-25

## 2012-11-21 NOTE — Assessment & Plan Note (Signed)
Discussed cessation. Will try wellbutrin

## 2012-11-21 NOTE — Assessment & Plan Note (Signed)
Suspect sarcoidosis, but need to bx. Discussed w her the possibility that this could reflect other inflammation or even lymphoma.  - set up EBUS for bx - ov in 1 mon
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
## 2012-11-21 NOTE — Patient Instructions (Addendum)
We will set up a bronchoscopy with endobronchial ultrasound to biopsy your lymph nodes Start wellbutrin 1 week before your cigarette quit date. Get rid of all your matches, lighters, ash trays, cigarettes prior to that date. Tell your family and friends that you are quitting so they can support you Follow with Dr Delton Coombes in 1 month

## 2012-11-22 ENCOUNTER — Encounter (HOSPITAL_COMMUNITY): Payer: Self-pay | Admitting: *Deleted

## 2012-11-25 ENCOUNTER — Encounter (HOSPITAL_COMMUNITY): Payer: Self-pay | Admitting: Pharmacy Technician

## 2012-11-27 ENCOUNTER — Encounter (HOSPITAL_COMMUNITY)
Admission: RE | Admit: 2012-11-27 | Discharge: 2012-11-27 | Disposition: A | Discharge status: Home / Self Care | Payer: BC Managed Care – PPO | Source: Ambulatory Visit | Attending: Emergency Medicine | Admitting: Emergency Medicine

## 2012-11-27 ENCOUNTER — Encounter (HOSPITAL_COMMUNITY): Payer: Self-pay

## 2012-11-27 LAB — PROTIME-INR: INR: 0.91 (ref 0.00–1.49)

## 2012-11-27 LAB — COMPREHENSIVE METABOLIC PANEL
ALT: 17 U/L (ref 0–35)
AST: 22 U/L (ref 0–37)
Albumin: 4 g/dL (ref 3.5–5.2)
CO2: 18 mEq/L — ABNORMAL LOW (ref 19–32)
Calcium: 9.1 mg/dL (ref 8.4–10.5)
Creatinine, Ser: 0.92 mg/dL (ref 0.50–1.10)
GFR calc non Af Amer: 75 mL/min — ABNORMAL LOW (ref >90)
Sodium: 135 mEq/L (ref 135–145)

## 2012-11-27 LAB — CBC
Hemoglobin: 9.7 g/dL — ABNORMAL LOW (ref 12.0–15.0)
MCHC: 31.3 g/dL (ref 30.0–36.0)
RBC: 4.03 MIL/uL (ref 3.87–5.11)

## 2012-11-27 LAB — HCG, SERUM, QUALITATIVE: Preg, Serum: NEGATIVE

## 2012-11-27 NOTE — Pre-Procedure Instructions (Signed)
11-27-12 PST visit today. Pt. Instructed and history completed.W. Kennon Portela

## 2012-11-27 NOTE — Patient Instructions (Addendum)
Your procedure is scheduled on:12-02-12 Report to [facility] Admitting ZO:1096 AM Call this number if you have problems morning of your procedure:[phone]  Follow all bowel prep instructions per your doctor's orders.  Do not eat or drink anything after midnight the night before your procedure. You may brush your teeth, rinse out your mouth, but no water, no food, no chewing gum, no mints, no candies, no chewing tobacco.     Take these medicines the morning of your procedure with A SIP OF WATER:Bupropion(Wellbutrin). Tenoretic. Do not take any Diabetic Meds Am of.   Please make arrangements for a responsible person to drive you home after the procedure. You cannot go home by cab/taxi. We recommend you have someone with you at home the first 24 hours after your procedure. Driver for procedure is son,-Brandon Shular -[phone]  LEAVE ALL VALUABLES, JEWELRY, BILLFOLD AT HOME.  NO DENTURES, CONTACT LENSES ALLOWED IN THE ENDOSCOPY ROOM.   YOU MAY WEAR DEODORANT, PLEASE REMOVE ALL JEWELRY, WATCHES RINGS, BODY PIERCINGS AND LEAVE AT HOME.   WOMEN: NO MAKE-UP, LOTIONS PERFUMES

## 2012-11-27 NOTE — Progress Notes (Signed)
11-27-12 1320 labs viewable in Epic done today-CBC,CMP,PT/INR,Serum Pregnancy. W. Kennon Portela

## 2012-12-02 ENCOUNTER — Ambulatory Visit (HOSPITAL_COMMUNITY)
Admission: RE | Admit: 2012-12-02 | Discharge: 2012-12-02 | Disposition: A | Discharge status: Home / Self Care | Payer: BC Managed Care – PPO | Source: Ambulatory Visit | Attending: Emergency Medicine | Admitting: Emergency Medicine

## 2012-12-02 ENCOUNTER — Ambulatory Visit (HOSPITAL_COMMUNITY): Payer: BC Managed Care – PPO | Admitting: Registered Nurse

## 2012-12-02 ENCOUNTER — Encounter (HOSPITAL_COMMUNITY)
Admission: RE | Disposition: A | Discharge status: Home / Self Care | Payer: Self-pay | Source: Ambulatory Visit | Attending: Emergency Medicine

## 2012-12-02 ENCOUNTER — Encounter (HOSPITAL_COMMUNITY): Payer: Self-pay | Admitting: Registered Nurse

## 2012-12-02 ENCOUNTER — Encounter (HOSPITAL_COMMUNITY): Payer: Self-pay

## 2012-12-02 DIAGNOSIS — R599 Enlarged lymph nodes, unspecified: Secondary | ICD-10-CM | POA: Insufficient documentation

## 2012-12-02 DIAGNOSIS — E119 Type 2 diabetes mellitus without complications: Secondary | ICD-10-CM | POA: Insufficient documentation

## 2012-12-02 DIAGNOSIS — F17200 Nicotine dependence, unspecified, uncomplicated: Secondary | ICD-10-CM | POA: Insufficient documentation

## 2012-12-02 DIAGNOSIS — I10 Essential (primary) hypertension: Secondary | ICD-10-CM | POA: Insufficient documentation

## 2012-12-02 DIAGNOSIS — R590 Localized enlarged lymph nodes: Secondary | ICD-10-CM

## 2012-12-02 HISTORY — PX: ENDOBRONCHIAL ULTRASOUND: SHX5096

## 2012-12-02 LAB — GLUCOSE, CAPILLARY: Glucose-Capillary: 106 mg/dL — ABNORMAL HIGH (ref 70–99)

## 2012-12-02 SURGERY — ENDOBRONCHIAL ULTRASOUND (EBUS)
Anesthesia: General | Laterality: Bilateral | Wound class: Clean Contaminated

## 2012-12-02 MED ORDER — SUCCINYLCHOLINE CHLORIDE 20 MG/ML IJ SOLN
INTRAMUSCULAR | Status: DC | PRN
  Administered 2012-12-02: 100 mg via INTRAVENOUS

## 2012-12-02 MED ORDER — ONDANSETRON HCL 4 MG/2ML IJ SOLN
INTRAMUSCULAR | Status: DC | PRN
  Administered 2012-12-02: 4 mg via INTRAVENOUS

## 2012-12-02 MED ORDER — DEXAMETHASONE SODIUM PHOSPHATE 10 MG/ML IJ SOLN
INTRAMUSCULAR | Status: DC | PRN
  Administered 2012-12-02: 10 mg via INTRAVENOUS

## 2012-12-02 MED ORDER — FENTANYL CITRATE 0.05 MG/ML IJ SOLN
INTRAMUSCULAR | Status: DC | PRN
  Administered 2012-12-02: 50 ug via INTRAVENOUS

## 2012-12-02 MED ORDER — EPHEDRINE SULFATE 50 MG/ML IJ SOLN
INTRAMUSCULAR | Status: DC | PRN
  Administered 2012-12-02: 5 mg via INTRAVENOUS

## 2012-12-02 MED ORDER — LACTATED RINGERS IV SOLN
INTRAVENOUS | Status: DC
  Administered 2012-12-02: 1000 mL via INTRAVENOUS

## 2012-12-02 MED ORDER — PROMETHAZINE HCL 25 MG/ML IJ SOLN: 6.2500 mg | INTRAMUSCULAR | Status: DC | PRN

## 2012-12-02 MED ORDER — FENTANYL CITRATE 0.05 MG/ML IJ SOLN: 25.0000 ug | INTRAMUSCULAR | Status: DC | PRN

## 2012-12-02 MED ORDER — MIDAZOLAM HCL 5 MG/5ML IJ SOLN
INTRAMUSCULAR | Status: DC | PRN
  Administered 2012-12-02: 2 mg via INTRAVENOUS

## 2012-12-02 MED ORDER — LIDOCAINE HCL (CARDIAC) 20 MG/ML IV SOLN
INTRAVENOUS | Status: DC | PRN
  Administered 2012-12-02: 80 mg via INTRAVENOUS

## 2012-12-02 MED ORDER — PROPOFOL 10 MG/ML IV BOLUS
INTRAVENOUS | Status: DC | PRN
  Administered 2012-12-02: 150 mg via INTRAVENOUS

## 2012-12-02 NOTE — Preoperative (Signed)
Beta Blockers   Reason not to administer Beta Blockers:Tenoretic taken 12-02-12 at [facility]

## 2012-12-02 NOTE — Anesthesia Preprocedure Evaluation (Addendum)
Anesthesia Evaluation  Patient identified by MRN, date of birth, ID band Patient awake    Reviewed: Allergy & Precautions, H&P , NPO status , Patient's Chart, lab work & pertinent test results  Airway Mallampati: II TM Distance: <3 FB Neck ROM: Full    Dental no notable dental hx.    Pulmonary neg pulmonary ROS,  breath sounds clear to auscultation  Pulmonary exam normal       Cardiovascular hypertension, Pt. on medications Rhythm:Regular Rate:Normal     Neuro/Psych negative neurological ROS  negative psych ROS   GI/Hepatic negative GI ROS, Neg liver ROS,   Endo/Other  diabetes, Type 2  Renal/GU negative Renal ROS  negative genitourinary   Musculoskeletal negative musculoskeletal ROS (+)   Abdominal   Peds negative pediatric ROS (+)  Hematology  (+) Blood dyscrasia, anemia ,   Anesthesia Other Findings   Reproductive/Obstetrics negative OB ROS                         Anesthesia Physical Anesthesia Plan  ASA: III  Anesthesia Plan: General   Post-op Pain Management:    Induction: Intravenous  Airway Management Planned: Oral ETT  Additional Equipment:   Intra-op Plan:   Post-operative Plan: Extubation in OR  Informed Consent: I have reviewed the patients History and Physical, chart, labs and discussed the procedure including the risks, benefits and alternatives for the proposed anesthesia with the patient or authorized representative who has indicated his/her understanding and acceptance.   Dental advisory given  Plan Discussed with: CRNA and Surgeon  Anesthesia Plan Comments:         Anesthesia Quick Evaluation

## 2012-12-02 NOTE — Transfer of Care (Signed)
Immediate Anesthesia Transfer of Care Note  Patient: Shelly Hall  Procedure(s) Performed: Procedure(s): ENDOBRONCHIAL ULTRASOUND (Bilateral)  Patient Location: PACU and Endoscopy Unit  Anesthesia Type:General  Level of Consciousness: awake, alert , oriented and patient cooperative  Airway & Oxygen Therapy: Patient Spontanous Breathing and Patient connected to face mask oxygen  Post-op Assessment: Report given to PACU RN, Post -op Vital signs reviewed and stable and Patient moving all extremities  Post vital signs: Reviewed and stable  Complications: No apparent anesthesia complications

## 2012-12-02 NOTE — Interval H&P Note (Signed)
PCCM Interval Note  No new issues or problems since last visit. She understands the procedure and no barriers identified.   Filed Vitals:   12/02/12 0701  BP: 131/91  Temp: 98.3 F (36.8 C)  TempSrc: Oral  Resp: 15  SpO2: 98%    Recent Labs Lab 11/27/12 0900  HGB 9.7*  HCT 31.0*  WBC 6.7  PLT 291    Recent Labs Lab 11/27/12 0900  NA 135  K 4.3  CL 103  CO2 18*  GLUCOSE 106*  BUN 15  CREATININE 0.92  CALCIUM 9.1    Recent Labs Lab 11/27/12 0900  INR 0.91   Plans:  Will proceed with nodal bxs via EBUS today.   Levy Pupa, MD, PhD 12/02/2012, 8:01 AM [facility] [phone] or if no answer [phone]
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
## 2012-12-02 NOTE — Anesthesia Postprocedure Evaluation (Signed)
  Anesthesia Post-op Note  Patient: Shelly Hall  Procedure(s) Performed: Procedure(s) (LRB): ENDOBRONCHIAL ULTRASOUND (Bilateral)  Patient Location: PACU  Anesthesia Type: General  Level of Consciousness: awake and alert   Airway and Oxygen Therapy: Patient Spontanous Breathing  Post-op Pain: mild  Post-op Assessment: Post-op Vital signs reviewed, Patient's Cardiovascular Status Stable, Respiratory Function Stable, Patent Airway and No signs of Nausea or vomiting  Last Vitals:  Filed Vitals:   12/02/12 0930  BP: 137/92  Temp:   Resp: 21    Post-op Vital Signs: stable   Complications: No apparent anesthesia complications

## 2012-12-02 NOTE — Op Note (Signed)
Video Bronchoscopy with Endobronchial Ultrasound Procedure Note  Date of Operation: 12/02/2012  Pre-op Diagnosis: Mediastinal lymphadenopathy  Post-op Diagnosis: same  Surgeon: Levy Pupa  Assistants: none  Anesthesia: General endotracheal anesthesia  Operation: Flexible video fiberoptic bronchoscopy with endobronchial ultrasound and biopsies.  Estimated Blood Loss: Minimal  Complications: none apparent  Indications and History: Shelly Hall is a 42 y.o. female with cough and mediastinal lymphadenopathy identified on CT scan from 10/29/12.  Recommendation was for needle biopsies via bronchoscopy and endobronchial ultrasound. The risks, benefits, complications, treatment options and expected outcomes were discussed with the patient.  The possibilities of pneumothorax, pneumonia, reaction to medication, pulmonary aspiration, perforation of a viscus, bleeding, failure to diagnose a condition and creating a complication requiring transfusion or operation were discussed with the patient who freely signed the consent.    Description of Procedure: The patient was examined in the preoperative area and history and data from the preprocedure consultation were reviewed. It was deemed appropriate to proceed.  The patient was taken to [facility] endoscopy, identified as [name] and the procedure verified as Flexible Video Fiberoptic Bronchoscopy.  A Time Out was held and the above information confirmed. General anesthesia was initiated and the patient  was orally intubated. The video fiberoptic bronchoscope was introduced via the endotracheal tube and a general inspection was performed which showed grossly normal airways with some generalized edema / thickening. The standard scope was then withdrawn and the endobronchial ultrasound was used to identify and characterize the peritracheal, hilar and bronchial lymph nodes. Inspection showed enlargement of Station 4 nodes, subcarinal node and distal R  sided nodes. Using real-time ultrasound guidance Wang needle biopsies were take from Station 4L, 4R, 7, 14R nodes and were sent for cytology. Station 7 node samples were also placed into saline for FLOW Cytometry. The patient tolerated the procedure well without apparent complications. There was no significant blood loss. The bronchoscope was withdrawn. Anesthesia was reversed and the patient was taken to Endoscopy for recovery.   Samples: 1. Wang needle biopsies from 4L node 2. Wang needle biopsies from 4R node 3. Wang needle biopsies from 7 node 4. Wang needle biopsies from 14R node  Plans:  The patient will be discharged from the PACU to home when recovered from anesthesia. We will review the cytology  results with the patient when they become available. Outpatient followup will be with Dr Delton Coombes.    Levy Pupa, MD, PhD 12/02/2012, 9:12 AM [facility] [phone] or if no answer [phone]

## 2012-12-03 ENCOUNTER — Encounter (HOSPITAL_COMMUNITY): Payer: Self-pay | Admitting: Emergency Medicine

## 2012-12-04 ENCOUNTER — Telehealth: Payer: Self-pay | Admitting: Emergency Medicine

## 2012-12-04 NOTE — Telephone Encounter (Signed)
Called pt to review results, LMOM. Will try her back.

## 2012-12-04 NOTE — Telephone Encounter (Signed)
Will forward message over to RB. Please advise thanks

## 2012-12-04 NOTE — Telephone Encounter (Signed)
Message closed in error copied below:  Reason for Call    None         Call Documentation    Leslye Peer, MD at 12/04/2012  3:49 PM    Status: Signed                   Called pt to review results, LMOM. Will try her back.             Encounter MyChart Messages    No messages in this encounter      Routing History    Priority Sent On From To Message Type      12/04/2012  3:50 PM Leslye Peer, MD Leslye Peer, MD Patient Calls     Created by    Leslye Peer, MD on 12/04/2012 03:47 PM            Visit Pharmacy    [address] [place], [place] - [address]      Contacts      Type Contact Phone    12/04/2012  3:47 PM Phone ([address]) [place], [place] (Self) [phone] (H)

## 2012-12-04 NOTE — Telephone Encounter (Signed)
Reviewed results with her by phone. Granulomas most consistent w sarcoidosis. We will discuss further at [facility].

## 2013-01-01 ENCOUNTER — Ambulatory Visit: Payer: BC Managed Care – PPO | Admitting: Emergency Medicine

## 2013-01-11 ENCOUNTER — Emergency Department (INDEPENDENT_AMBULATORY_CARE_PROVIDER_SITE_OTHER)
Admission: EM | Admit: 2013-01-11 | Discharge: 2013-01-11 | Disposition: A | Discharge status: Home / Self Care | Payer: BC Managed Care – PPO | Source: Home / Self Care

## 2013-01-11 ENCOUNTER — Encounter (HOSPITAL_COMMUNITY): Payer: Self-pay | Admitting: Emergency Medicine

## 2013-01-11 DIAGNOSIS — N390 Urinary tract infection, site not specified: Secondary | ICD-10-CM

## 2013-01-11 MED ORDER — CEPHALEXIN 500 MG PO CAPS: 500.0000 mg | ORAL_CAPSULE | Freq: Two times a day (BID) | ORAL | Status: DC

## 2013-01-11 NOTE — ED Provider Notes (Signed)
Shelly Hall is a 42 y.o. female who presents to Urgent Care today for dysuria suprapubic pain urgency and frequency present for the last 3 weeks. It is associated with minor back pain. Her symptoms are consistent with UTI. She denies any vaginal discharge nausea vomiting or diarrhea. She has not tried any medications yet. She feels well otherwise   PMH reviewed. Hypertension diabetes and sarcoidosis History  Substance Use Topics  . Smoking status: Current Every Day Smoker -- 0.25 packs/day for 23 years    Types: Cigarettes  . Smokeless tobacco: Never Used  . Alcohol Use: 1.0 oz/week    2 drink(s) per week     Comment: occ. x2 dks per week   ROS as above Medications reviewed. No current facility-administered medications for this encounter.   Current Outpatient Prescriptions  Medication Sig Dispense Refill  . atenolol-chlorthalidone (TENORETIC) 50-25 MG per tablet Take 1 tablet by mouth every morning.      . metFORMIN (GLUCOPHAGE) 500 MG tablet Take 1 tablet (500 mg total) by mouth 2 (two) times daily with a meal.  60 tablet  2  . buPROPion (WELLBUTRIN SR) 150 MG 12 hr tablet Take 150 mg by mouth See admin instructions. She is to take one tablet for 3 days starting on 11/25/12 and then one tablet twice daily.      . cephALEXin (KEFLEX) 500 MG capsule Take 1 capsule (500 mg total) by mouth 2 (two) times daily.  14 capsule  0    Exam:  BP 126/82  Pulse 79  Temp(Src) 98.3 F (36.8 C) (Oral)  Resp 18  SpO2 99%  LMP 12/27/2012 Gen: Well NAD HEENT: EOMI,  MMM Lungs: CTABL Nl WOB Heart: RRR no MRG Abd: NABS, NT, ND Exts: Non edematous BL  LE, warm and well perfused.   Urinalysis shows positive leukocyte esterase Results for orders placed during the hospital encounter of 01/11/13 (from the past 24 hour(s))  POCT PREGNANCY, URINE     Status: None   Collection Time    01/11/13  5:02 PM      Result Value Range   Preg Test, Ur NEGATIVE  NEGATIVE   No results found.  Assessment  and Plan: 42 y.o. female with UTI.  Plan to treat empirically with Keflex. Urine culture pending. Discussed warning signs or symptoms. Please see discharge instructions. Patient expresses understanding.      Rodolph Bong, MD 01/11/13 [phone]

## 2013-01-11 NOTE — ED Notes (Signed)
Pt c/o poss uti sxs onset 3 weeks sxs today include: dysuria, abd pain that radiates to the back, urinary freq/urgency, nauseas Denies: f/v/d She is alert w/no signs of acute distress.

## 2013-01-14 LAB — URINE CULTURE

## 2013-01-14 NOTE — ED Notes (Signed)
Urine culture: 75,000 colonies E. Coli. Pt. adequately treated with Keflex. Vassie Moselle 01/14/2013

## 2013-06-05 DIAGNOSIS — C50919 Malignant neoplasm of unspecified site of unspecified female breast: Secondary | ICD-10-CM

## 2013-06-05 DIAGNOSIS — C73 Malignant neoplasm of thyroid gland: Secondary | ICD-10-CM

## 2013-06-05 HISTORY — DX: Malignant neoplasm of unspecified site of unspecified female breast: C50.919

## 2013-06-05 HISTORY — DX: Malignant neoplasm of thyroid gland: C73

## 2013-08-04 ENCOUNTER — Encounter (HOSPITAL_COMMUNITY): Payer: Self-pay | Admitting: Emergency Medicine

## 2013-08-04 ENCOUNTER — Emergency Department (HOSPITAL_COMMUNITY)
Admission: EM | Admit: 2013-08-04 | Discharge: 2013-08-04 | Disposition: A | Discharge status: Home / Self Care | Payer: No Typology Code available for payment source | Attending: Emergency Medicine | Admitting: Emergency Medicine

## 2013-08-04 ENCOUNTER — Emergency Department (HOSPITAL_COMMUNITY): Payer: No Typology Code available for payment source

## 2013-08-04 DIAGNOSIS — R296 Repeated falls: Secondary | ICD-10-CM | POA: Insufficient documentation

## 2013-08-04 DIAGNOSIS — Y9301 Activity, walking, marching and hiking: Secondary | ICD-10-CM | POA: Insufficient documentation

## 2013-08-04 DIAGNOSIS — Y929 Unspecified place or not applicable: Secondary | ICD-10-CM | POA: Insufficient documentation

## 2013-08-04 DIAGNOSIS — Z8619 Personal history of other infectious and parasitic diseases: Secondary | ICD-10-CM | POA: Insufficient documentation

## 2013-08-04 DIAGNOSIS — I10 Essential (primary) hypertension: Secondary | ICD-10-CM | POA: Insufficient documentation

## 2013-08-04 DIAGNOSIS — E119 Type 2 diabetes mellitus without complications: Secondary | ICD-10-CM | POA: Insufficient documentation

## 2013-08-04 DIAGNOSIS — F17200 Nicotine dependence, unspecified, uncomplicated: Secondary | ICD-10-CM | POA: Insufficient documentation

## 2013-08-04 DIAGNOSIS — S82402A Unspecified fracture of shaft of left fibula, initial encounter for closed fracture: Secondary | ICD-10-CM

## 2013-08-04 DIAGNOSIS — S82899A Other fracture of unspecified lower leg, initial encounter for closed fracture: Secondary | ICD-10-CM | POA: Insufficient documentation

## 2013-08-04 MED ORDER — HYDROCODONE-ACETAMINOPHEN 5-325 MG PO TABS: 1.0000 | ORAL_TABLET | ORAL | Status: DC | PRN

## 2013-08-04 MED ORDER — OXYCODONE-ACETAMINOPHEN 5-325 MG PO TABS
1.0000 | ORAL_TABLET | ORAL | Status: AC
  Administered 2013-08-04: 1 via ORAL
  Filled 2013-08-04: qty 1

## 2013-08-04 NOTE — ED Notes (Signed)
Socks given

## 2013-08-04 NOTE — ED Provider Notes (Signed)
CSN: [id number]     Arrival date & time 08/04/13  1738 History  This chart was scribed for non-physician practitioner, Barton Dubois, PA-C,working with Neta Ehlers, MD, by Marlowe Kays, ED Scribe.  This patient was seen in room TR08C/TR08C and the patient's care was started at 11:08 PM.  Chief Complaint  Patient presents with  . Ankle Pain   The history is provided by the patient. No language interpreter was used.   HPI Comments:  Shelly Hall is a 43 y.o. female who presents to the Emergency Department complaining of a left ankle injury while walking approximately 8 hours ago. Pt states she was walking and her ankle "gave out" with inversion and caused her to fall. She states she was able to ambulate initially, but is not able to bear weight right now. She reports some associated tingling of the left foot. She denies taking anything for pain since the fall. She states she has h/o spraining this ankle. She denies head injury, LOC, neck or back pain or numbness in her toes.   Past Medical History  Diagnosis Date  . Diabetes mellitus   . Hypertension   . Sarcoidosis    Past Surgical History  Procedure Laterality Date  . Uterine fibroid surgery    . Tubal ligation  2001  . Endobronchial ultrasound Bilateral 12/02/2012    Procedure: ENDOBRONCHIAL ULTRASOUND;  Surgeon: Collene Gobble, MD;  Location: [facility] ENDOSCOPY;  Service: Cardiopulmonary;  Laterality: Bilateral;   Family History  Problem Relation Age of Onset  . Asthma Mother    History  Substance Use Topics  . Smoking status: Current Every Day Smoker -- 0.25 packs/day for 23 years    Types: Cigarettes  . Smokeless tobacco: Never Used  . Alcohol Use: 1.0 oz/week    2 drink(s) per week     Comment: occ. x2 dks per week   OB History   Grav Para Term Preterm Abortions TAB SAB Ect Mult Living                 Review of Systems  Musculoskeletal: Positive for arthralgias and joint swelling.  Neurological: Negative for  syncope.  All other systems reviewed and are negative.   Allergies  Review of patient's allergies indicates no known allergies.  Home Medications  No current outpatient prescriptions on file. Triage Vitals: BP 181/97  Pulse 83  Temp(Src) 98.7 F (37.1 C) (Oral)  Resp 18  Ht 5\' 3"  (1.6 m)  Wt 180 lb (81.647 kg)  BMI 31.89 kg/m2  SpO2 99%  LMP 08/01/2013 Physical Exam  Nursing note and vitals reviewed. Constitutional: She is oriented to person, place, and time. She appears well-developed and well-nourished. No distress.  HENT:  Head: Normocephalic and atraumatic.  Eyes:  Normal appearance  Neck: Normal range of motion.  Pulmonary/Chest: Effort normal.  Musculoskeletal: Normal range of motion.  L ankle w/out deformity, edema or skin changes.  Tenderness L lateral malleolus only.  Pain w/ minimal passive ROM of ankle.  2+ DP pulse and distal sensation intact.    Neurological: She is alert and oriented to person, place, and time.  Psychiatric: She has a normal mood and affect. Her behavior is normal.    ED Course  Procedures (including critical care time) DIAGNOSTIC STUDIES: Oxygen Saturation is 99% on RA, normal by my interpretation.   COORDINATION OF CARE: 11:13 PM- Will provide a cam walker and pain medication. Advised pt to elevate and ice the ankle. Will refer to orthopedist.  Pt verbalizes understanding and agrees to plan.  Medications  oxyCODONE-acetaminophen (PERCOCET/ROXICET) 5-325 MG per tablet 1 tablet (1 tablet Oral Given 08/04/13 2302)    Labs Review Labs Reviewed - No data to display Imaging Review Dg Ankle Complete Left  08/04/2013   CLINICAL DATA:  Ankle injury with pain and swelling around the lateral malleolus.  EXAM: LEFT ANKLE COMPLETE - 3+ VIEW  COMPARISON:  None.  FINDINGS: Three views study shows an avulsion injury involving the distal tip of the fibula. Ankle mortise is preserved. No other fracture is evident. No subluxation or dislocation.   IMPRESSION: Small avulsion fragment from the tip of the distal fibula.   Electronically Signed   By: Misty Stanley M.D.   On: 08/04/2013 19:36     EKG Interpretation None      MDM   Final diagnoses:  Fracture of left fibula    Pt presents w/ L ankle injury.  Xray shows avulsion fx at tip of distal fibula.  No NV deficits on exam.  Ortho tech provided her w/ cam walker and crutches.  Pt d/c'd home w/ vicodin and referral to ortho.    I personally performed the services described in this documentation, which was scribed in my presence. The recorded information has been reviewed and is accurate.    Remer Macho, PA-C 08/05/13 [phone]

## 2013-08-04 NOTE — Discharge Instructions (Signed)
Take vicodin as prescribed for severe pain.   Do not drive within four hours of taking this medication (may cause drowsiness or confusion).  Ice 3 times a day for 15-20 minutes.  Elevate when possible to decrease swelling and pain.  Activity as tolerated.  Follow up with your orthopedist.  You may return to the ER if you have any other concerns.

## 2013-08-04 NOTE — ED Notes (Signed)
Per pt sts her leg gave out on her earlier today and she injured left ankle. sts she had some tingling sensation in her leg and she twisted left ankle.

## 2013-08-05 NOTE — ED Provider Notes (Signed)
Medical screening examination/treatment/procedure(s) were performed by non-physician practitioner and as supervising physician I was immediately available for consultation/collaboration.  Neta Ehlers, MD 08/05/13 [phone]

## 2013-09-16 ENCOUNTER — Ambulatory Visit: Payer: Self-pay | Admitting: Podiatry

## 2013-09-18 ENCOUNTER — Other Ambulatory Visit (HOSPITAL_COMMUNITY): Payer: Self-pay | Admitting: Obstetrics & Gynecology

## 2013-09-18 DIAGNOSIS — Z1231 Encounter for screening mammogram for malignant neoplasm of breast: Secondary | ICD-10-CM

## 2013-09-19 ENCOUNTER — Ambulatory Visit (HOSPITAL_COMMUNITY)
Admission: RE | Admit: 2013-09-19 | Discharge: 2013-09-19 | Disposition: A | Discharge status: Home / Self Care | Payer: No Typology Code available for payment source | Source: Ambulatory Visit | Attending: Obstetrics & Gynecology | Admitting: Obstetrics & Gynecology

## 2013-09-19 DIAGNOSIS — Z1231 Encounter for screening mammogram for malignant neoplasm of breast: Secondary | ICD-10-CM | POA: Insufficient documentation

## 2013-09-24 ENCOUNTER — Other Ambulatory Visit: Payer: Self-pay | Admitting: Obstetrics & Gynecology

## 2013-09-24 DIAGNOSIS — R928 Other abnormal and inconclusive findings on diagnostic imaging of breast: Secondary | ICD-10-CM

## 2013-09-30 ENCOUNTER — Other Ambulatory Visit: Payer: Self-pay | Admitting: Obstetrics & Gynecology

## 2013-09-30 ENCOUNTER — Ambulatory Visit
Admission: RE | Admit: 2013-09-30 | Discharge: 2013-09-30 | Disposition: A | Discharge status: Home / Self Care | Payer: No Typology Code available for payment source | Source: Ambulatory Visit | Attending: Obstetrics & Gynecology | Admitting: Obstetrics & Gynecology

## 2013-09-30 DIAGNOSIS — N6320 Unspecified lump in the left breast, unspecified quadrant: Secondary | ICD-10-CM

## 2013-09-30 DIAGNOSIS — R928 Other abnormal and inconclusive findings on diagnostic imaging of breast: Secondary | ICD-10-CM

## 2013-10-03 ENCOUNTER — Ambulatory Visit
Admission: RE | Admit: 2013-10-03 | Discharge: 2013-10-03 | Disposition: A | Discharge status: Home / Self Care | Payer: No Typology Code available for payment source | Source: Ambulatory Visit | Attending: Obstetrics & Gynecology | Admitting: Obstetrics & Gynecology

## 2013-10-03 DIAGNOSIS — N6320 Unspecified lump in the left breast, unspecified quadrant: Secondary | ICD-10-CM

## 2013-10-06 ENCOUNTER — Other Ambulatory Visit: Payer: Self-pay | Admitting: Obstetrics & Gynecology

## 2013-10-06 DIAGNOSIS — D0512 Intraductal carcinoma in situ of left breast: Secondary | ICD-10-CM

## 2013-10-09 ENCOUNTER — Encounter (HOSPITAL_COMMUNITY): Payer: Self-pay

## 2013-10-10 ENCOUNTER — Other Ambulatory Visit: Payer: No Typology Code available for payment source

## 2013-10-10 ENCOUNTER — Telehealth: Payer: Self-pay | Admitting: *Deleted

## 2013-10-10 ENCOUNTER — Ambulatory Visit
Admission: RE | Admit: 2013-10-10 | Discharge: 2013-10-10 | Disposition: A | Discharge status: Home / Self Care | Payer: No Typology Code available for payment source | Source: Ambulatory Visit | Attending: Obstetrics & Gynecology | Admitting: Obstetrics & Gynecology

## 2013-10-10 DIAGNOSIS — C50919 Malignant neoplasm of unspecified site of unspecified female breast: Secondary | ICD-10-CM | POA: Insufficient documentation

## 2013-10-10 DIAGNOSIS — D0512 Intraductal carcinoma in situ of left breast: Secondary | ICD-10-CM

## 2013-10-10 DIAGNOSIS — C50412 Malignant neoplasm of upper-outer quadrant of left female breast: Secondary | ICD-10-CM

## 2013-10-10 MED ORDER — GADOBENATE DIMEGLUMINE 529 MG/ML IV SOLN
17.0000 mL | Freq: Once | INTRAVENOUS | Status: AC | PRN
  Administered 2013-10-10: 17 mL via INTRAVENOUS

## 2013-10-10 NOTE — Telephone Encounter (Signed)
Confirmed [facility] for 10/15/13 at 8am .  Instructions and contact information given.

## 2013-10-15 ENCOUNTER — Other Ambulatory Visit (HOSPITAL_BASED_OUTPATIENT_CLINIC_OR_DEPARTMENT_OTHER): Payer: No Typology Code available for payment source

## 2013-10-15 ENCOUNTER — Ambulatory Visit: Payer: No Typology Code available for payment source | Attending: General Surgery | Admitting: Physical Therapy

## 2013-10-15 ENCOUNTER — Ambulatory Visit
Admission: RE | Admit: 2013-10-15 | Discharge: 2013-10-15 | Disposition: A | Discharge status: Home / Self Care | Payer: No Typology Code available for payment source | Source: Ambulatory Visit | Attending: Radiation Oncology | Admitting: Radiation Oncology

## 2013-10-15 ENCOUNTER — Ambulatory Visit: Payer: No Typology Code available for payment source

## 2013-10-15 ENCOUNTER — Telehealth: Payer: Self-pay | Admitting: *Deleted

## 2013-10-15 ENCOUNTER — Encounter: Payer: Self-pay | Admitting: Oncology

## 2013-10-15 ENCOUNTER — Telehealth: Payer: Self-pay | Admitting: Oncology

## 2013-10-15 ENCOUNTER — Ambulatory Visit (HOSPITAL_BASED_OUTPATIENT_CLINIC_OR_DEPARTMENT_OTHER): Payer: No Typology Code available for payment source | Admitting: General Surgery

## 2013-10-15 ENCOUNTER — Other Ambulatory Visit: Payer: Self-pay | Admitting: *Deleted

## 2013-10-15 ENCOUNTER — Ambulatory Visit (HOSPITAL_BASED_OUTPATIENT_CLINIC_OR_DEPARTMENT_OTHER): Payer: No Typology Code available for payment source | Admitting: Oncology

## 2013-10-15 VITALS — BP 170/115 | HR 88 | Temp 98.5°F | Resp 18 | Ht 64.0 in | Wt 180.6 lb

## 2013-10-15 DIAGNOSIS — Z170 Estrogen receptor positive status [ER+]: Secondary | ICD-10-CM

## 2013-10-15 DIAGNOSIS — IMO0001 Reserved for inherently not codable concepts without codable children: Secondary | ICD-10-CM | POA: Insufficient documentation

## 2013-10-15 DIAGNOSIS — C50419 Malignant neoplasm of upper-outer quadrant of unspecified female breast: Secondary | ICD-10-CM

## 2013-10-15 DIAGNOSIS — C50919 Malignant neoplasm of unspecified site of unspecified female breast: Secondary | ICD-10-CM

## 2013-10-15 DIAGNOSIS — C50412 Malignant neoplasm of upper-outer quadrant of left female breast: Secondary | ICD-10-CM

## 2013-10-15 DIAGNOSIS — E119 Type 2 diabetes mellitus without complications: Secondary | ICD-10-CM

## 2013-10-15 DIAGNOSIS — R293 Abnormal posture: Secondary | ICD-10-CM | POA: Insufficient documentation

## 2013-10-15 DIAGNOSIS — I10 Essential (primary) hypertension: Secondary | ICD-10-CM

## 2013-10-15 DIAGNOSIS — F17200 Nicotine dependence, unspecified, uncomplicated: Secondary | ICD-10-CM

## 2013-10-15 DIAGNOSIS — D869 Sarcoidosis, unspecified: Secondary | ICD-10-CM | POA: Insufficient documentation

## 2013-10-15 LAB — CBC WITH DIFFERENTIAL/PLATELET
BASO%: 0.6 % (ref 0.0–2.0)
Basophils Absolute: 0 10*3/uL (ref 0.0–0.1)
EOS%: 5.2 % (ref 0.0–7.0)
Eosinophils Absolute: 0.3 10*3/uL (ref 0.0–0.5)
HCT: 30.3 % — ABNORMAL LOW (ref 34.8–46.6)
HGB: 9.5 g/dL — ABNORMAL LOW (ref 11.6–15.9)
LYMPH%: 24.6 % (ref 14.0–49.7)
MCH: 24.2 pg — AB (ref 25.1–34.0)
MCHC: 31.3 g/dL — AB (ref 31.5–36.0)
MCV: 77.5 fL — AB (ref 79.5–101.0)
MONO#: 0.4 10*3/uL (ref 0.1–0.9)
MONO%: 8.8 % (ref 0.0–14.0)
NEUT#: 3 10*3/uL (ref 1.5–6.5)
NEUT%: 60.8 % (ref 38.4–76.8)
PLATELETS: 240 10*3/uL (ref 145–400)
RBC: 3.91 10*6/uL (ref 3.70–5.45)
RDW: 31.1 % — ABNORMAL HIGH (ref 11.2–14.5)
WBC: 4.9 10*3/uL (ref 3.9–10.3)
lymph#: 1.2 10*3/uL (ref 0.9–3.3)

## 2013-10-15 LAB — COMPREHENSIVE METABOLIC PANEL (CC13)
ALK PHOS: 56 U/L (ref 40–150)
ALT: 20 U/L (ref 0–55)
ANION GAP: 12 meq/L — AB (ref 3–11)
AST: 19 U/L (ref 5–34)
Albumin: 3.8 g/dL (ref 3.5–5.0)
BILIRUBIN TOTAL: 0.34 mg/dL (ref 0.20–1.20)
BUN: 11 mg/dL (ref 7.0–26.0)
CO2: 19 meq/L — AB (ref 22–29)
CREATININE: 0.8 mg/dL (ref 0.6–1.1)
Calcium: 9.5 mg/dL (ref 8.4–10.4)
Chloride: 110 mEq/L — ABNORMAL HIGH (ref 98–109)
Glucose: 103 mg/dl (ref 70–140)
Potassium: 3.4 mEq/L — ABNORMAL LOW (ref 3.5–5.1)
Sodium: 141 mEq/L (ref 136–145)
Total Protein: 7.4 g/dL (ref 6.4–8.3)

## 2013-10-15 MED ORDER — ATENOLOL-CHLORTHALIDONE 50-25 MG PO TABS: 1.0000 | ORAL_TABLET | Freq: Every day | ORAL | Status: DC

## 2013-10-15 MED ORDER — METFORMIN HCL 500 MG PO TABS: 500.0000 mg | ORAL_TABLET | Freq: Two times a day (BID) | ORAL | Status: DC

## 2013-10-15 NOTE — Progress Notes (Signed)
Checked in new patient with no financial issues at this time. No copay because she didn't know and she has not seen the dr yet. She has no PCP as of now and has not been out of the country. I did explain to her with no PCP she will be billed for the 100.00 copy instead of 75.00(per her ins card), so it is important she finds a PCP and gets a referral from them when seeing us(dr). She has Training and development officer and will call me if interested in 1000.00 grant-1 family household

## 2013-10-15 NOTE — Progress Notes (Signed)
I gave her [facility] packet.

## 2013-10-15 NOTE — Telephone Encounter (Signed)
, °

## 2013-10-15 NOTE — Progress Notes (Signed)
[facility]  [phone] [phone]     ID: Shelly Hall OB: [id number]  MR#: [id number]  JHH#:[id number]  PCP: No PCP Per Patient GYN:  [name] SU: [name] OTHER MD: Thea Silversmith  CHIEF COMPLAINT: "I found a lump in my breast"  BREAST CANCER HISTORY: Shelly Hall palpated a mass in her left breast mid-April 2015 and immediately brought it to Dr. Benjie Karvonen' attention. She was set up for bilateral screening mammography with tomography at [facility] hospital 09/19/2013. The possible distortion in the left breast and a nearby group of calcifications was felt to warrant further evaluation, and on 09/30/2013 she underwent unilateral left diagnostic mammography and ultrasonography. There was an irregular mass in the outer left breast associated with microcalcifications. 4 cm anterior to this there was another cluster of pleomorphic calcifications spanning 7 mm. He had more anteriorly there was an irregular mass associated with other calcifications. In total the abnormalities in the left breast spent approximately 10 cm, and is segmental distribution. On exam there was a palpable firm mass at the 2:30 o'clock position of the left breast 10 cm from the nipple. There was palpable adenopathy in the inferior left axilla. Ultrasound confirmed an irregular hypoechoic mass in the left breast measuring 4.6 cm. In addition, a 1.4 cm oval slightly irregular mass was noted at the 3:00 position and yet another mass measured 9 mm in the same area. Ultrasound of the left axilla showed a dominant 3.6 cm lymph node.  Biopsy of 2 of the left breast masses and the suspicious left breast lymph node on may 01/03/2014 showed ([id number]) an invasive ductal carcinoma, grade 2, estrogen receptor 90% positive with strong staining intensity, progesterone receptor 40% positive but moderate staining intensity, with an MIB-1 of 20% and no HER-2 amplification. (Because all 3 biopsies were  morphologically identical, only one prognostic panel was sent).  On may 01/22/2014 the patient underwent bilateral breast MRI. This showed numerous enhancing masses in the left breast, the largest measuring 3.5 cm, and the aggregate measuring 12.7 cm. There were numerous enlarged left axillary lymph nodes, at least 5 of which were enlarged, both at levels 1 and 2. The largest measured 2.3 cm. The right breast was negative.  The patient's subsequent history is as detailed below.   INTERVAL HISTORY: Shelly Hall was evaluated in the multidisciplinary breast cancer clinic 10/15/2013. Her case was discussed at conference that same morning.  REVIEW OF SYSTEMS: Aside from the mass in question, there were no worrisome symptoms leading to the definitive mammogram. The patient denies unusual headaches, visual changes, nausea, vomiting, stiff neck, dizziness, or gait imbalance. There has been no cough, phlegm production, or pleurisy, no chest pain or pressure, and no change in bladder habits. The patient denies fever, rash, bleeding, unexplained fatigue or unexplained weight loss. She does complain of night sweats, insomnia, and some fatigue. She can be short of breath when walking up stairs but not otherwise. She has been slightly constipated lately. With this new diagnosis she feels anxious and depressed. A detailed review of systems was otherwise entirely negative.  PAST MEDICAL HISTORY: Past Medical History  Diagnosis Date  . Diabetes mellitus   . Hypertension   . Sarcoidosis   . Anxiety   . Depression   . Anemia     PAST SURGICAL HISTORY: Past Surgical History  Procedure Laterality Date  . Uterine fibroid surgery    . Tubal ligation  2001  . Endobronchial ultrasound Bilateral 12/02/2012    Procedure:  ENDOBRONCHIAL ULTRASOUND;  Surgeon: Collene Gobble, MD;  Location: [facility];  Service: Cardiopulmonary;  Laterality: Bilateral;    FAMILY HISTORY Family History  Problem Relation Age of Onset    . Asthma Mother   The patient's parents are living, in her mid to late 60s. The patient had one brother, no sisters. There is no history of breast or ovarian cancer in the family to her knowledge.   GYNECOLOGIC HISTORY:   menarche age 8, first live birth at 57. She is GX P2. She still having regular periods, which tend to be heavy.   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 She is single and lives alone, although her mother is currently staying with her. Her son Shelly Hall lives in [place] and works at mother [employer]. Son Shelly Hall lives in [place] where he works at a [employer]. The patient has 1 grandchild on the way. She is a Psychologist, forensic.    ADVANCED DIRECTIVES: not in place   HEALTH MAINTENANCE: History  Substance Use Topics  . Smoking status: Current Every Day Smoker -- 1.00 packs/day for 23 years    Types: Cigarettes  . Smokeless tobacco: Never Used  . Alcohol Use: 1.0 oz/week    2 drink(s) per week     Comment: occ. x2 dks per week     Colonoscopy:  PAP:  Bone density: November 2013  Lipid panel:  No Known Allergies  Current Outpatient Prescriptions  Medication Sig Dispense Refill  . HYDROcodone-acetaminophen (NORCO/VICODIN) 5-325 MG per tablet Take 1 tablet by mouth every 4 (four) hours as needed for moderate pain.  20 tablet  0  . metroNIDAZOLE (FLAGYL) 500 MG tablet       . norethindrone (AYGESTIN) 5 MG tablet        No current facility-administered medications for this visit.    OBJECTIVE: Middle-aged [place] American woman who appears stated age 43 Vitals:   10/15/13 0847  BP: 170/115  Pulse: 88  Temp: 98.5 F (36.9 C)  Resp: 18     Body mass index is 30.98 kg/(m^2).    ECOG FS:1 - Symptomatic but completely ambulatory  Ocular: Sclerae unicteric, pupils equal, round and reactive to light Ear-nose-throat: Oropharynx clear and moist Lymphatic: No cervical or supraclavicular adenopathy Lungs no rales or  rhonchi, good excursion bilaterally Heart regular rate and rhythm, no murmur appreciated Abd soft, obese, nontender, positive bowel sounds MSK no focal spinal tenderness, no upper extremity lymphedema Neuro: non-focal, well-oriented, positive affect Breasts: The right breast is unremarkable. In the left breast there is an easily palpable mass in the upper outer quadrant measuring approximately 3 cm by palpation. It is movable. There are no skin or nipple changes of concern beyond minimal ecchymosis secondary to the recent biopsy. There is a palpable mass in the left axilla, which appears movable. It measures approximately 2 cm.   LAB RESULTS:  CMP     Component Value Date/Time   NA 141 10/15/2013 0821   NA 135 11/27/2012 0900   K 3.4* 10/15/2013 0821   K 4.3 11/27/2012 0900   CL 103 11/27/2012 0900   CO2 19* 10/15/2013 0821   CO2 18* 11/27/2012 0900   GLUCOSE 103 10/15/2013 0821   GLUCOSE 106* 11/27/2012 0900   BUN 11.0 10/15/2013 0821   BUN 15 11/27/2012 0900   CREATININE 0.8 10/15/2013 0821   CREATININE 0.92 11/27/2012 0900   CALCIUM 9.5 10/15/2013 0821   CALCIUM 9.1 11/27/2012 0900   PROT 7.4 10/15/2013  0821   PROT 8.5* 11/27/2012 0900   ALBUMIN 3.8 10/15/2013 0821   ALBUMIN 4.0 11/27/2012 0900   AST 19 10/15/2013 0821   AST 22 11/27/2012 0900   ALT 20 10/15/2013 0821   ALT 17 11/27/2012 0900   ALKPHOS 56 10/15/2013 0821   ALKPHOS 81 11/27/2012 0900   BILITOT 0.34 10/15/2013 0821   BILITOT 0.1* 11/27/2012 0900   GFRNONAA 75* 11/27/2012 0900   GFRAA 87* 11/27/2012 0900    I No results found for this basename: SPEP, UPEP,  kappa and lambda light chains    Lab Results  Component Value Date   WBC 4.9 10/15/2013   NEUTROABS 3.0 10/15/2013   HGB 9.5* 10/15/2013   HCT 30.3* 10/15/2013   MCV 77.5* 10/15/2013   PLT 240 10/15/2013      Chemistry      Component Value Date/Time   NA 141 10/15/2013 0821   NA 135 11/27/2012 0900   K 3.4* 10/15/2013 0821   K 4.3 11/27/2012 0900   CL 103 11/27/2012 0900     CO2 19* 10/15/2013 0821   CO2 18* 11/27/2012 0900   BUN 11.0 10/15/2013 0821   BUN 15 11/27/2012 0900   CREATININE 0.8 10/15/2013 0821   CREATININE 0.92 11/27/2012 0900      Component Value Date/Time   CALCIUM 9.5 10/15/2013 0821   CALCIUM 9.1 11/27/2012 0900   ALKPHOS 56 10/15/2013 0821   ALKPHOS 81 11/27/2012 0900   AST 19 10/15/2013 0821   AST 22 11/27/2012 0900   ALT 20 10/15/2013 0821   ALT 17 11/27/2012 0900   BILITOT 0.34 10/15/2013 0821   BILITOT 0.1* 11/27/2012 0900       No results found for this basename: LABCA2    No components found with this basename: LABCA125    No results found for this basename: INR,  in the last 168 hours  Urinalysis    Component Value Date/Time   COLORURINE YELLOW 01/04/2012 0246   APPEARANCEUR CLOUDY* 01/04/2012 0246   LABSPEC 1.030 01/04/2012 0246   PHURINE 6.5 01/04/2012 0246   GLUCOSEU NEGATIVE 01/04/2012 0246   HGBUR LARGE* 01/04/2012 0246   BILIRUBINUR NEGATIVE 01/04/2012 0246   KETONESUR NEGATIVE 01/04/2012 0246   PROTEINUR NEGATIVE 01/04/2012 0246   UROBILINOGEN 0.2 01/04/2012 0246   NITRITE NEGATIVE 01/04/2012 0246   LEUKOCYTESUR NEGATIVE 01/04/2012 0246    STUDIES: Mr Breast Bilateral W Wo Contrast  10/10/2013   CLINICAL DATA:  Recently diagnosed left breast cancer. Ultrasound-guided core biopsy of 3 o'clock location 3 cm from the nipple shows invasive ductal carcinoma and ductal carcinoma in situ. Biopsy of the 230 o'clock location 10 cm from the nipple shows invasive ductal carcinoma and ductal carcinoma in situ. Biopsy of left axillary lymph node shows metastatic disease.  LABS:  Not applicable  EXAM: BILATERAL BREAST MRI WITH AND WITHOUT CONTRAST  TECHNIQUE: Multiplanar, multisequence MR images of both breasts were obtained prior to and following the intravenous administration of ml of MultiHance.  THREE-DIMENSIONAL MR IMAGE RENDERING ON INDEPENDENT WORKSTATION:  Three-dimensional MR images were rendered by post-processing of the original MR data on an  independent workstation. The three-dimensional MR images were interpreted, and findings are reported in the following complete MRI report for this study. Three dimensional images were evaluated at the independent DynaCad workstation  COMPARISON:  Previous exams  FINDINGS: Breast composition: b.  Scattered fibroglandular tissue.  Background parenchymal enhancement: Mild  Right breast: No mass or abnormal enhancement.  Left breast: There are  numerous enhancing masses of bearing sizes within the lateral aspect of the left breast. The largest is 2.4 x 2.8 x 3.5 cm and demonstrates washout type enhancement. Within this mass there is associated with clip artifact following recent biopsy. Measured together, the enhancing region is 12.7 x 4.5 x 3.8 cm.  Lymph nodes: Numerous enlarged left axillary lymph nodes are identified. At least 5 enlarged nodes are seen, involving both level 1 and level 2 nodal stations. The largest is in the level 1 lower axilla measuring 2.3 cm. Post biopsy changes are identified in this region. The right axilla and internal mammary regions are unremarkable.  Ancillary findings:  None.  IMPRESSION: 1. MRI evidence for significant left breast malignancy, measuring at least 12.7 cm in anterior-posterior extent. 2. Enlarged left level 1 and level 2 axillary lymph nodes. 3. Right breast is negative.  RECOMMENDATION: Treatment plan.  BI-RADS CATEGORY  6: Known biopsy-proven malignancy.   Electronically Signed   By: Shon Hale M.D.   On: 10/10/2013 11:45   Mm Digital Diagnostic Unilat L  10/03/2013   CLINICAL DATA:  Biopsies of 2 masses in the left breast for performed today, as well as biopsy of the suspicious left axillary lymph node.  EXAM: POST-BIOPSY CLIP PLACEMENT LEFT DIAGNOSTIC MAMMOGRAM  COMPARISON:  Previous exams.  FINDINGS: Films are performed following ultrasound guided biopsy of a mass at 3 o'clock position 3 cm from the nipple and a mass at 2:30 position 10 cm from the nipple. A coil  shaped biopsy clip is satisfactorily positioned within the smaller mass at the 3 o'clock position 3 cm from the nipple. A ribbon shaped biopsy clip is satisfactorily positioned within the larger mass in the 2:30 position of the left breast 10 cm from the nipple. Please note that there also suspicious microcalcifications in the left breast between the two biopsied masses, all within a segmental distribution.  IMPRESSION: Satisfactory position of 2 biopsy clips in the left breast as described above.  Final Assessment: Post Procedure Mammograms for Marker Placement   Electronically Signed   By: Curlene Dolphin M.D.   On: 10/03/2013 16:38   Mm Digital Diagnostic Unilat L  09/30/2013   CLINICAL DATA:  43 year old patient with possible distortion and calcifications identified in the left breast on recent screening mammogram/ tomogram. The patient reports that she has felt a palpable lump in the 3 o'clock region of the left breast and has more recently noticed a palpable lump within the left axilla that is tender.  EXAM: DIGITAL DIAGNOSTIC  LEFT MAMMOGRAM WITH CAD  ULTRASOUND LEFT BREAST  COMPARISON:  Screening mammogram/tomogram 09/19/2013.  ACR Breast Density Category b: There are scattered areas of fibroglandular density.  FINDINGS: Focal spot compression view of the outer left breast shows an irregular mass with associated distortion. There are several microcalcifications within this irregular mass. Approximately 4 cm anterior to the dominant mass in the outer left breast is a cluster of pleomorphic calcifications that span approximately 7 mm. Further anterior in the outer left breast, is an irregular mass with two associated calcifications. There is a probable area of spiculation in the outer left breast approximately halfway between the dominant mass and the smaller mass. In total, the abnormalities in the left breast on mammography span approximately 10 cm and are in a segmental distribution.  It is noted that  there is suspicion for left axillary lymphadenopathy based on the screening mammogram/tomogram performed 09/19/2013.  Mammographic images were processed with CAD.  On physical exam,  I palpate a firm mass in the 2:30 o'clock position of the left breast centered approximately 10 cm from the nipple. I palpate discrete lymphadenopathy in the inferior left axilla.  Ultrasound is performed, showing an irregular hypoechoic mass containing internal calcifications centered at 2:30 position 10 cm from the nipple that measures approximately 4.6 x 1.9 x 1.0 cm. There is internal vascular flow. At 3 o'clock position 7 cm from the nipple is an oval slightly irregular mass with internal calcifications and internal vascularity that measures 1.4 x 0.8 x 0.8 cm. At 3 o'clock position approximately 3 cm from the nipple is an irregular hypoechoic mass with internal echogenicity likely reflecting calcification. This mass measures 5 x 7 x 9 mm and demonstrates prominent vascular flow. This is felt to correspond to the smaller irregular mass in the outer left breast with two associated microcalcifications on the mammogram.  Ultrasound of the left axilla the demonstrates a dominant 3.6 x 1.6 x 2.7 cm lymph node with a hypoechoic and bulky, thickened cortex. There is peripheral vascular flow that extends into the abnormal cortex.  IMPRESSION: Findings highly suspicious for and multifocal malignancy in a segmental distribution of the outer left breast and metastatic left axillary lymphadenopathy.  RECOMMENDATION: Ultrasound-guided core needle biopsies of the dominant palpable mass in the 2:30 position of the left breast 10 cm from the nipple, the small irregular mass at 3 o'clock position 3 cm from the nipple, and of the palpable lymphadenopathy are recommended. Biopsies have been scheduled for Friday, May 1st, at 2:30 p.m.  I have discussed the findings and recommendations with the patient. Results were also provided in writing at the  conclusion of the visit. If applicable, a reminder letter will be sent to the patient regarding the next appointment.  BI-RADS CATEGORY  5: Highly suggestive of malignancy.   Electronically Signed   By: Curlene Dolphin M.D.   On: 09/30/2013 16:21   US Breast Ltd Uni Left Inc Axilla  09/30/2013   CLINICAL DATA:  43 year old patient with possible distortion and calcifications identified in the left breast on recent screening mammogram/ tomogram. The patient reports that she has felt a palpable lump in the 3 o'clock region of the left breast and has more recently noticed a palpable lump within the left axilla that is tender.  EXAM: DIGITAL DIAGNOSTIC  LEFT MAMMOGRAM WITH CAD  ULTRASOUND LEFT BREAST  COMPARISON:  Screening mammogram/tomogram 09/19/2013.  ACR Breast Density Category b: There are scattered areas of fibroglandular density.  FINDINGS: Focal spot compression view of the outer left breast shows an irregular mass with associated distortion. There are several microcalcifications within this irregular mass. Approximately 4 cm anterior to the dominant mass in the outer left breast is a cluster of pleomorphic calcifications that span approximately 7 mm. Further anterior in the outer left breast, is an irregular mass with two associated calcifications. There is a probable area of spiculation in the outer left breast approximately halfway between the dominant mass and the smaller mass. In total, the abnormalities in the left breast on mammography span approximately 10 cm and are in a segmental distribution.  It is noted that there is suspicion for left axillary lymphadenopathy based on the screening mammogram/tomogram performed 09/19/2013.  Mammographic images were processed with CAD.  On physical exam, I palpate a firm mass in the 2:30 o'clock position of the left breast centered approximately 10 cm from the nipple. I palpate discrete lymphadenopathy in the inferior left axilla.  Ultrasound is performed, showing  an irregular hypoechoic mass containing internal calcifications centered at 2:30 position 10 cm from the nipple that measures approximately 4.6 x 1.9 x 1.0 cm. There is internal vascular flow. At 3 o'clock position 7 cm from the nipple is an oval slightly irregular mass with internal calcifications and internal vascularity that measures 1.4 x 0.8 x 0.8 cm. At 3 o'clock position approximately 3 cm from the nipple is an irregular hypoechoic mass with internal echogenicity likely reflecting calcification. This mass measures 5 x 7 x 9 mm and demonstrates prominent vascular flow. This is felt to correspond to the smaller irregular mass in the outer left breast with two associated microcalcifications on the mammogram.  Ultrasound of the left axilla the demonstrates a dominant 3.6 x 1.6 x 2.7 cm lymph node with a hypoechoic and bulky, thickened cortex. There is peripheral vascular flow that extends into the abnormal cortex.  IMPRESSION: Findings highly suspicious for and multifocal malignancy in a segmental distribution of the outer left breast and metastatic left axillary lymphadenopathy.  RECOMMENDATION: Ultrasound-guided core needle biopsies of the dominant palpable mass in the 2:30 position of the left breast 10 cm from the nipple, the small irregular mass at 3 o'clock position 3 cm from the nipple, and of the palpable lymphadenopathy are recommended. Biopsies have been scheduled for Friday, May 1st, at 2:30 p.m.  I have discussed the findings and recommendations with the patient. Results were also provided in writing at the conclusion of the visit. If applicable, a reminder letter will be sent to the patient regarding the next appointment.  BI-RADS CATEGORY  5: Highly suggestive of malignancy.   Electronically Signed   By: Curlene Dolphin M.D.   On: 09/30/2013 16:21   Mm Screening Breast Tomo Bilateral  09/22/2013   CLINICAL DATA:  Screening.  EXAM: DIGITAL SCREENING BILATERAL MAMMOGRAM WITH 3D TOMO WITH CAD   COMPARISON:  Previous Exam(s).  ACR Breast Density Category b: There are scattered areas of fibroglandular density.  FINDINGS: In the left breast, possible distortion warrants further evaluation with spot compression views and possibly ultrasound. A nearby group of calcifications warrant further evaluation with spot magnification views. In the right breast, no findings suspicious for malignancy. Images were processed with CAD.  IMPRESSION: Further evaluation is suggested for possible distortion and calcifications in the left breast.  RECOMMENDATION: Diagnostic mammogram and possibly ultrasound of the left breast. (Code:FI-L-12M)  The patient will be contacted regarding the findings, and additional imaging will be scheduled.  BI-RADS CATEGORY  0: Incomplete. Need additional imaging evaluation and/or prior mammograms for comparison.   Electronically Signed   By: Evangeline Dakin M.D.   On: 09/22/2013 13:14   [place] Lt Breast Bx W Loc Dev 1st Lesion Img Bx Spec US Guide  10/06/2013   ADDENDUM REPORT: 10/06/2013 15:30  ADDENDUM: Pathology results of 2 left breast biopsies and a left axillary lymph node biopsy are received.  Pathology results of the left breast biopsy at 3 o'clock position 3 cm from the nipple show invasive ductal carcinoma and DCIS.  Pathology results of the mass at 2:30 position left breast 10 cm from the nipple show invasive ductal carcinoma and DCIS.  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  Pathology results for all 3 biopsies are concordant.  The patient reports doing well after the biopsies.  The patient has been scheduled for bilateral breast MRI on 10/10/2013 at 9:30 a.m. she is scheduled for the [facility]  at [facility] on 10/15/2013. The patient was given both of these dates. I have made her aware that there are free information materials available at the [place] about Breast Cancer that she can pick  up.   Electronically Signed   By: Curlene Dolphin M.D.   On: 10/06/2013 15:30   10/06/2013   CLINICAL DATA:  Suspicious masses in the outer left breast and suspicious left axillary lymph node. A total of 3 biopsies were performed today. This biopsy describes the mass at 3 o'clock position approximately 3 cm from nipple that measures 7 x 5 x 9 mm.  EXAM: ULTRASOUND GUIDED LEFT BREAST CORE NEEDLE BIOPSY  COMPARISON:  Previous exams.  PROCEDURE: I met with the patient and we discussed the procedure of ultrasound-guided biopsy, including benefits and alternatives. We discussed the high likelihood of a successful procedure. We discussed the risks of the procedure including infection, bleeding, tissue injury, clip migration, and inadequate sampling. Informed written consent was given. The usual time-out protocol was performed immediately prior to the procedure.  Using sterile technique and 2% Lidocaine as local anesthetic, under direct ultrasound visualization, a 12 gauge vacuum-assisteddevice was used to perform biopsy of a 9 mm mass at 3 o'clock position 3 cm from the nipple using a lateral to medial approach. At the conclusion of the procedure, a coil shaped tissue marker clip was deployed into the biopsy cavity. Follow-up 2-view mammogram was performed and dictated separately.  IMPRESSION: Ultrasound-guided biopsy of left breast mass at 3 o'clock position 3 cm from the nipple. No apparent complications.  Electronically Signed: By: Curlene Dolphin M.D. On: 10/03/2013 16:26   [place] Lt Breast Bx W Loc Dev Ea Add Lesion Img Bx Spec US Guide  10/06/2013   ADDENDUM REPORT: 10/06/2013 15:31  ADDENDUM: Pathology results of 2 left breast biopsies and a left axillary lymph node biopsy are received.  Pathology results of the left breast biopsy at 3 o'clock position 3 cm from the nipple show invasive ductal carcinoma and DCIS.  Pathology results of the mass at 2:30 position left breast 10 cm from the nipple show invasive ductal  carcinoma and DCIS.  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  Pathology results for all 3 biopsies are concordant.  The patient reports doing well after the biopsies.  The patient has been scheduled for bilateral breast MRI on 10/10/2013 at 9:30 a.m. she is scheduled for the Multidisciplinary Breast Cancer Clinic at [facility] on 10/15/2013. The patient was given both of these dates. I have made her aware that there are free information materials available at the [facility] about Breast Cancer that she can pick up.   Electronically Signed   By: Curlene Dolphin M.D.   On: 10/06/2013 15:31   10/06/2013   CLINICAL DATA:  Patient presents for biopsy of 2 suspicious left breast masses and is suspicious left axillary lymph node. This biopsied describes a report of the biopsy of the palpable left axillary lymph node.  EXAM: ULTRASOUND GUIDED CORE NEEDLE BIOPSY OF A LEFT AXILLARY NODE  COMPARISON:  Previous exams.  FINDINGS: I met with the patient and we discussed the procedure of ultrasound-guided biopsy, including benefits and alternatives. We discussed the high likelihood of a successful procedure. We discussed the risks of the procedure, including infection, bleeding, tissue injury, clip migration, and inadequate sampling. Informed written consent was given. The usual time-out protocol was performed immediately prior to the procedure.  Using sterile technique and 2% Lidocaine as local anesthetic, under direct ultrasound visualization, a 9 gauge vacuum assisted device was used to perform biopsy of an enlarged lymph node with a thickened cortex using a lateral to medial approach.  IMPRESSION: Ultrasound guided biopsy of suspicious left axillary lymph node. No apparent complications.  Electronically Signed: By: Curlene Dolphin M.D. On: 10/03/2013 16:28   [place] Lt Breast Bx W Loc Dev Ea Add Lesion Img Bx Spec US Guide  10/06/2013   ADDENDUM REPORT: 10/06/2013  15:30  ADDENDUM: Pathology results of 2 left breast biopsies and a left axillary lymph node biopsy are received.  Pathology results of the left breast biopsy at 3 o'clock position 3 cm from the nipple show invasive ductal carcinoma and DCIS.  Pathology results of the mass at 2:30 position left breast 10 cm from the nipple show invasive ductal carcinoma and DCIS.  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  Pathology results for all 3 biopsies are concordant.  The patient reports doing well after the biopsies.  The patient has been scheduled for bilateral breast MRI on 10/10/2013 at 9:30 a.m. she is scheduled for the Multidisciplinary Breast Cancer Clinic at [facility] on 10/15/2013. The patient was given both of these dates. I have made her aware that there are free information materials available at the [place] about Breast Cancer that she can pick up.   Electronically Signed   By: Curlene Dolphin M.D.   On: 10/06/2013 15:30   10/06/2013   CLINICAL DATA:  Patient presents for total of 3 biopsies today. This report describes biopsy of the palpable left breast mass centered at 2:30 position 10 cm from the nipple.  EXAM: ULTRASOUND GUIDED LEFT BREAST CORE NEEDLE BIOPSY  COMPARISON:  Previous exams.  PROCEDURE: I met with the patient and we discussed the procedure of ultrasound-guided biopsy, including benefits and alternatives. We discussed the high likelihood of a successful procedure. We discussed the risks of the procedure including infection, bleeding, tissue injury, clip migration, and inadequate sampling. Informed written consent was given. The usual time-out protocol was performed immediately prior to the procedure.  Using sterile technique and 2% Lidocaine as local anesthetic, under direct ultrasound visualization, a 12 gauge vacuum-assisteddevice was used to perform biopsy of a palpable mass at 2:30 position 10 cm from the nipple using a lateral  to medial approach. At the conclusion of the procedure, a ribbon shaped tissue marker clip was deployed into the biopsy cavity. Follow-up 2-view mammogram was performed and dictated separately.  IMPRESSION: Ultrasound-guided biopsy of left breast mass 2:30 position 10 cm from the nipple. No apparent complications.  Electronically Signed: By: Curlene Dolphin M.D. On: 10/03/2013 16:27    ASSESSMENT: 43 y.o. [place] woman status post left breast and left axillary lymph node biopsy 10/03/2013, both positive for a clinical T2 N2, stage IIIA invasive ductal carcinoma, grade 2, estrogen and progesterone receptor positive, HER-2 negative, with an MIB-1 of 20%.  (1) neoadjuvant chemotherapy to consist of doxorubicin and cyclophosphamide in dose dense fashion x4, with Neulasta support, to be followed by paclitaxel weekly x12  (2) definitive surgery to follow chemotherapy  (3) radiation to follow surgery  (4) tamoxifen to be started at the end of radiation  (5) continuing tobacco abuse: The patient was strongly advised to discontinue smoking  PLAN: We spent the better part of today's hour-long appointment discussing the biology of breast cancer in general, and the  specifics of the patient's tumor in particular. Shelly Hall understands the area of abnormality in the left breast is very extensive, and it will be impossible to avoid mastectomy. However if she is treated neoadjuvant ly, she may be able to participate in the Alliance trial, and possibly avoid full axillary lymph node dissection. She understands she needs chemotherapy even if she had bilateral mastectomies, which he is not contemplating. She also understands there is no difference in survival whether she chooses chemotherapy first then surgery or the opposite sequence. Finally she understands she will receive the same chemotherapy whether or not she had the surgery first.  Having discussed all that, she is very agreeable to starting with chemotherapy,  and we are tentative starting 10/30/2013. She will have a port, echocardiogram, and staging studies as well as come to chemotherapy school prior to returning to see me on May 22. I have gone ahead and made all the appointment for her and entered all her treatments and labs so that she can have a specific schedule to discuss with her employer. She has been employed less than 2 months and she is afraid of losing her job because of the time she will have to miss to receive her treatments.  The plan will be for doxorubicin and cyclophosphamide in dose dense fashion x4 with Neulasta support, followed by paclitaxel weekly x12. The patient will then have her definitive surgery, followed by radiation, after which she will start antiestrogens and continue those for 5-10 years.  Shelly Hall has a good understanding of the overall plan. She agrees with it. She knows a goal of treatment of her cases cured. She will call with any problems that may develop before her next visit here.     Chauncey Cruel, MD   10/15/2013 11:27 AM

## 2013-10-15 NOTE — Telephone Encounter (Signed)
Per staff phone call and POF I have schedueld appts.  JMW  

## 2013-10-15 NOTE — Telephone Encounter (Signed)
Refill primary meds per Dr Jana Hakim until patient established with primary MD later this [facility].

## 2013-10-15 NOTE — Assessment & Plan Note (Signed)
Patient appears to have a T3 N1 new left breast cancer.  She will undergo staging studies. Assuming this is negative, she will discuss with Dr. Jana Hakim whether she should undergo neoadjuvant chemotherapy to see if she has an improvement in her nodal disease. If she had a complete pathologic response in the axilla, she would be eligible for the Alliance trial to evaluate her for a sentinel lymph node biopsy after chemotherapy. If she does not desire to do this, she would need a left-sided mastectomy with axillary lymph node dissection and Port-A-Cath placement.    She is not a candidate for immediate reconstruction because of the size of her mass and nodal status. She will need radiation postoperatively no matter what. She was also advised to stop smoking.    She is highly motivated as she is having her first grandchild in September.    I discussed the risks of Port-A-Cath placement. I showed the patient educational material regarding the port.  She was advised about the risk of pneumothorax, malposition, malfunction, blood clot.    45 min spent in evaluation, examination, counseling, and coordination of care.    >50% spent in counseling.

## 2013-10-15 NOTE — Progress Notes (Signed)
Chief complaint:  New left breast cancer  HISTORY: Patient is a 43 year old female who is referred by Dr. Radford Pax and Dr. Benjie Karvonen for consultation regarding new left breast cancer.  She presented with a palpable lump in the left breast and left axilla. She had been called back from screening, but after she had her screening mammograms, she noted these masses. On her mammograms, she had multiple masses that were distant from each other. 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 Ki-67 was 20%.  MRI was performed and demonstrates a 12.7 cm area of enhancement with multiple enlarged level I and level II lymph nodes.   She is very anxious because she has a new job doing custodial type work. She has no family history of cancer that she is aware of. She had menarche at age 41. She is still having periods. She has used hormonal contraception. She has had her first child at age 75. She has had a bone density study. She is up-to-date with her pap smear.  She drinks about one alcoholic beverage per week and smokes a pack a day.  Past Medical History  Diagnosis Date  . Diabetes mellitus   . Hypertension   . Sarcoidosis   . Anxiety   . Depression   . Anemia     Past Surgical History  Procedure Laterality Date  . Uterine fibroid surgery    . Tubal ligation  2001  . Endobronchial ultrasound Bilateral 12/02/2012    Procedure: ENDOBRONCHIAL ULTRASOUND;  Surgeon: Collene Gobble, MD;  Location: [facility];  Service: Cardiopulmonary;  Laterality: Bilateral;    Current Outpatient Prescriptions  Medication Sig Dispense Refill  . atenolol-chlorthalidone (TENORETIC) 50-25 MG per tablet Take 1 tablet by mouth daily.  30 tablet  1  . HYDROcodone-acetaminophen (NORCO/VICODIN) 5-325 MG per tablet Take 1 tablet by mouth every 4 (four) hours as needed for moderate pain.  20 tablet  0  .  metFORMIN (GLUCOPHAGE) 500 MG tablet Take 1 tablet (500 mg total) by mouth 2 (two) times daily with a meal.  60 tablet  1  . metroNIDAZOLE (FLAGYL) 500 MG tablet       . norethindrone (AYGESTIN) 5 MG tablet        No current facility-administered medications for this visit.     No Known Allergies   Family History  Problem Relation Age of Onset  . Asthma Mother      History   Social History  . Marital Status: Single    Spouse Name: N/A    Number of Children: 2  . Years of Education: N/A   Occupational History  . [employer]    Social History Main Topics  . Smoking status: Current Every Day Smoker -- 1.00 packs/day for 23 years    Types: Cigarettes  . Smokeless tobacco: Never Used  . Alcohol Use: 1.0 oz/week    2 drink(s) per week     Comment: occ. x2 dks per week  . Drug Use: No  . Sexual Activity: Not Currently   Other Topics Concern  . Not on file   Social History Narrative  . No narrative on file     REVIEW OF SYSTEMS - PERTINENT POSITIVES ONLY: 12 point review of systems negative other than HPI and PMH except for fatigue, insomnia, left breast pain, shortness of breath in walking, abdominal pain, change  in stool habits, headaches, anxiety and depression, diabetes, and anemia secondary to fibroids.  EXAM: There were no vitals filed for this visit.  Wt Readings from Last 3 Encounters:  10/15/13 180 lb 9.6 oz (81.92 kg)  08/04/13 180 lb (81.647 kg)  11/27/12 191 lb (86.637 kg)     Gen:  No acute distress.  Well nourished and well groomed.   Neurological: Alert and oriented to person, place, and time. Coordination normal.  Head: Normocephalic and atraumatic.  Eyes: Conjunctivae are normal. Pupils are equal, round, and reactive to light. No scleral icterus.  Neck: Normal range of motion. Neck supple. No tracheal deviation or thyromegaly present.  Cardiovascular: Normal rate, regular rhythm, normal heart sounds and intact distal pulses.  Exam reveals no  gallop and no friction rub.  No murmur heard. Breast: palpable mass in upper outer quadrant. 3 cm.  Additional mass at 1 o'clock behind nipple.  This one was around 2 cm.  There is a large lymph node palpable in the left axilla.  No palpable masses on the right.  No nipple retraction. No skin dimpling.   Respiratory: Effort normal.  No respiratory distress. No chest wall tenderness. Breath sounds normal.  No wheezes, rales or rhonchi.  GI: Soft. Bowel sounds are normal. The abdomen is soft and nontender.  There is no rebound and no guarding.  Musculoskeletal: Normal range of motion. Extremities are nontender.  Lymphadenopathy: No cervical, preauricular, or postauricular adenopathy is present Skin: Skin is warm and dry. No rash noted. No diaphoresis. No erythema. No pallor. No clubbing, cyanosis, or edema.   Psychiatric: Normal mood and affect. Behavior is normal. Judgment and thought content normal.    LABORATORY RESULTS: Available labs are reviewed   Recent Results (from the past 2160 hour(s))  COMPREHENSIVE METABOLIC PANEL ([facility])     Status: Abnormal   Collection Time    10/15/13  8:21 AM      Result Value Ref Range   Sodium 141  136 - 145 mEq/L   Potassium 3.4 (*) 3.5 - 5.1 mEq/L   Chloride 110 (*) 98 - 109 mEq/L   CO2 19 (*) 22 - 29 mEq/L   Glucose 103  70 - 140 mg/dl   BUN 11.0  7.0 - 26.0 mg/dL   Creatinine 0.8  0.6 - 1.1 mg/dL   Total Bilirubin 0.34  0.20 - 1.20 mg/dL   Alkaline Phosphatase 56  40 - 150 U/L   AST 19  5 - 34 U/L   ALT 20  0 - 55 U/L   Total Protein 7.4  6.4 - 8.3 g/dL   Albumin 3.8  3.5 - 5.0 g/dL   Calcium 9.5  8.4 - 10.4 mg/dL   Anion Gap 12 (*) 3 - 11 mEq/L  CBC WITH DIFFERENTIAL     Status: Abnormal   Collection Time    10/15/13  8:21 AM      Result Value Ref Range   WBC 4.9  3.9 - 10.3 10e3/uL   NEUT# 3.0  1.5 - 6.5 10e3/uL   HGB 9.5 (*) 11.6 - 15.9 g/dL   HCT 30.3 (*) 34.8 - 46.6 %   Platelets 240  145 - 400 10e3/uL   MCV 77.5 (*) 79.5 - 101.0 fL    MCH 24.2 (*) 25.1 - 34.0 pg   MCHC 31.3 (*) 31.5 - 36.0 g/dL   RBC 3.91  3.70 - 5.45 10e6/uL   RDW 31.1 (*) 11.2 - 14.5 %   lymph# 1.2  0.9 - 3.3 10e3/uL   MONO# 0.4  0.1 - 0.9 10e3/uL   Eosinophils Absolute 0.3  0.0 - 0.5 10e3/uL   Basophils Absolute 0.0  0.0 - 0.1 10e3/uL   NEUT% 60.8  38.4 - 76.8 %   LYMPH% 24.6  14.0 - 49.7 %   MONO% 8.8  0.0 - 14.0 %   EOS% 5.2  0.0 - 7.0 %   BASO% 0.6  0.0 - 2.0 %     RADIOLOGY RESULTS: See E-Chart or I-Site for most recent results.  Images and reports are reviewed.  Mr Breast Bilateral W Wo Contrast  10/10/2013   CLINICAL DATA:  Recently diagnosed left breast cancer. Ultrasound-guided core biopsy of 3 o'clock location 3 cm from the nipple shows invasive ductal carcinoma and ductal carcinoma in situ. Biopsy of the 230 o'clock location 10 cm from the nipple shows invasive ductal carcinoma and ductal carcinoma in situ. Biopsy of left axillary lymph node shows metastatic disease.  LABS:  Not applicable  EXAM: BILATERAL BREAST MRI WITH AND WITHOUT CONTRAST  TECHNIQUE: Multiplanar, multisequence MR images of both breasts were obtained prior to and following the intravenous administration of ml of MultiHance.  THREE-DIMENSIONAL MR IMAGE RENDERING ON INDEPENDENT WORKSTATION:  Three-dimensional MR images were rendered by post-processing of the original MR data on an independent workstation. The three-dimensional MR images were interpreted, and findings are reported in the following complete MRI report for this study. Three dimensional images were evaluated at the independent DynaCad workstation  COMPARISON:  Previous exams  FINDINGS: Breast composition: b.  Scattered fibroglandular tissue.  Background parenchymal enhancement: Mild  Right breast: No mass or abnormal enhancement.  Left breast: There are numerous enhancing masses of bearing sizes within the lateral aspect of the left breast. The largest is 2.4 x 2.8 x 3.5 cm and demonstrates washout type  enhancement. Within this mass there is associated with clip artifact following recent biopsy. Measured together, the enhancing region is 12.7 x 4.5 x 3.8 cm.  Lymph nodes: Numerous enlarged left axillary lymph nodes are identified. At least 5 enlarged nodes are seen, involving both level 1 and level 2 nodal stations. The largest is in the level 1 lower axilla measuring 2.3 cm. Post biopsy changes are identified in this region. The right axilla and internal mammary regions are unremarkable.  Ancillary findings:  None.  IMPRESSION: 1. MRI evidence for significant left breast malignancy, measuring at least 12.7 cm in anterior-posterior extent. 2. Enlarged left level 1 and level 2 axillary lymph nodes. 3. Right breast is negative.  RECOMMENDATION: Treatment plan.  BI-RADS CATEGORY  6: Known biopsy-proven malignancy.   Electronically Signed   By: Shon Hale M.D.   On: 10/10/2013 11:45   Mm Digital Diagnostic Unilat L  10/03/2013   CLINICAL DATA:  Biopsies of 2 masses in the left breast for performed today, as well as biopsy of the suspicious left axillary lymph node.  EXAM: POST-BIOPSY CLIP PLACEMENT LEFT DIAGNOSTIC MAMMOGRAM  COMPARISON:  Previous exams.  FINDINGS: Films are performed following ultrasound guided biopsy of a mass at 3 o'clock position 3 cm from the nipple and a mass at 2:30 position 10 cm from the nipple. A coil shaped biopsy clip is satisfactorily positioned within the smaller mass at the 3 o'clock position 3 cm from the nipple. A ribbon shaped biopsy clip is satisfactorily positioned within the larger mass in the 2:30 position of the left breast 10 cm from the nipple. Please note that there also suspicious microcalcifications  in the left breast between the two biopsied masses, all within a segmental distribution.  IMPRESSION: Satisfactory position of 2 biopsy clips in the left breast as described above.  Final Assessment: Post Procedure Mammograms for Marker Placement   Electronically Signed   By:  Curlene Dolphin M.D.   On: 10/03/2013 16:38   Mm Digital Diagnostic Unilat L  09/30/2013   CLINICAL DATA:  43 year old patient with possible distortion and calcifications identified in the left breast on recent screening mammogram/ tomogram. The patient reports that she has felt a palpable lump in the 3 o'clock region of the left breast and has more recently noticed a palpable lump within the left axilla that is tender.  EXAM: DIGITAL DIAGNOSTIC  LEFT MAMMOGRAM WITH CAD  ULTRASOUND LEFT BREAST  COMPARISON:  Screening mammogram/tomogram 09/19/2013.  ACR Breast Density Category b: There are scattered areas of fibroglandular density.  FINDINGS: Focal spot compression view of the outer left breast shows an irregular mass with associated distortion. There are several microcalcifications within this irregular mass. Approximately 4 cm anterior to the dominant mass in the outer left breast is a cluster of pleomorphic calcifications that span approximately 7 mm. Further anterior in the outer left breast, is an irregular mass with two associated calcifications. There is a probable area of spiculation in the outer left breast approximately halfway between the dominant mass and the smaller mass. In total, the abnormalities in the left breast on mammography span approximately 10 cm and are in a segmental distribution.  It is noted that there is suspicion for left axillary lymphadenopathy based on the screening mammogram/tomogram performed 09/19/2013.  Mammographic images were processed with CAD.  On physical exam, I palpate a firm mass in the 2:30 o'clock position of the left breast centered approximately 10 cm from the nipple. I palpate discrete lymphadenopathy in the inferior left axilla.  Ultrasound is performed, showing an irregular hypoechoic mass containing internal calcifications centered at 2:30 position 10 cm from the nipple that measures approximately 4.6 x 1.9 x 1.0 cm. There is internal vascular flow. At 3 o'clock  position 7 cm from the nipple is an oval slightly irregular mass with internal calcifications and internal vascularity that measures 1.4 x 0.8 x 0.8 cm. At 3 o'clock position approximately 3 cm from the nipple is an irregular hypoechoic mass with internal echogenicity likely reflecting calcification. This mass measures 5 x 7 x 9 mm and demonstrates prominent vascular flow. This is felt to correspond to the smaller irregular mass in the outer left breast with two associated microcalcifications on the mammogram.  Ultrasound of the left axilla the demonstrates a dominant 3.6 x 1.6 x 2.7 cm lymph node with a hypoechoic and bulky, thickened cortex. There is peripheral vascular flow that extends into the abnormal cortex.  IMPRESSION: Findings highly suspicious for and multifocal malignancy in a segmental distribution of the outer left breast and metastatic left axillary lymphadenopathy.  RECOMMENDATION: Ultrasound-guided core needle biopsies of the dominant palpable mass in the 2:30 position of the left breast 10 cm from the nipple, the small irregular mass at 3 o'clock position 3 cm from the nipple, and of the palpable lymphadenopathy are recommended. Biopsies have been scheduled for Friday, May 1st, at 2:30 p.m.  I have discussed the findings and recommendations with the patient. Results were also provided in writing at the conclusion of the visit. If applicable, a reminder letter will be sent to the patient regarding the next appointment.  BI-RADS CATEGORY  5: Highly suggestive  of malignancy.   Electronically Signed   By: Curlene Dolphin M.D.   On: 09/30/2013 16:21   US Breast Ltd Uni Left Inc Axilla  09/30/2013   CLINICAL DATA:  43 year old patient with possible distortion and calcifications identified in the left breast on recent screening mammogram/ tomogram. The patient reports that she has felt a palpable lump in the 3 o'clock region of the left breast and has more recently noticed a palpable lump within the left  axilla that is tender.  EXAM: DIGITAL DIAGNOSTIC  LEFT MAMMOGRAM WITH CAD  ULTRASOUND LEFT BREAST  COMPARISON:  Screening mammogram/tomogram 09/19/2013.  ACR Breast Density Category b: There are scattered areas of fibroglandular density.  FINDINGS: Focal spot compression view of the outer left breast shows an irregular mass with associated distortion. There are several microcalcifications within this irregular mass. Approximately 4 cm anterior to the dominant mass in the outer left breast is a cluster of pleomorphic calcifications that span approximately 7 mm. Further anterior in the outer left breast, is an irregular mass with two associated calcifications. There is a probable area of spiculation in the outer left breast approximately halfway between the dominant mass and the smaller mass. In total, the abnormalities in the left breast on mammography span approximately 10 cm and are in a segmental distribution.  It is noted that there is suspicion for left axillary lymphadenopathy based on the screening mammogram/tomogram performed 09/19/2013.  Mammographic images were processed with CAD.  On physical exam, I palpate a firm mass in the 2:30 o'clock position of the left breast centered approximately 10 cm from the nipple. I palpate discrete lymphadenopathy in the inferior left axilla.  Ultrasound is performed, showing an irregular hypoechoic mass containing internal calcifications centered at 2:30 position 10 cm from the nipple that measures approximately 4.6 x 1.9 x 1.0 cm. There is internal vascular flow. At 3 o'clock position 7 cm from the nipple is an oval slightly irregular mass with internal calcifications and internal vascularity that measures 1.4 x 0.8 x 0.8 cm. At 3 o'clock position approximately 3 cm from the nipple is an irregular hypoechoic mass with internal echogenicity likely reflecting calcification. This mass measures 5 x 7 x 9 mm and demonstrates prominent vascular flow. This is felt to correspond  to the smaller irregular mass in the outer left breast with two associated microcalcifications on the mammogram.  Ultrasound of the left axilla the demonstrates a dominant 3.6 x 1.6 x 2.7 cm lymph node with a hypoechoic and bulky, thickened cortex. There is peripheral vascular flow that extends into the abnormal cortex.  IMPRESSION: Findings highly suspicious for and multifocal malignancy in a segmental distribution of the outer left breast and metastatic left axillary lymphadenopathy.  RECOMMENDATION: Ultrasound-guided core needle biopsies of the dominant palpable mass in the 2:30 position of the left breast 10 cm from the nipple, the small irregular mass at 3 o'clock position 3 cm from the nipple, and of the palpable lymphadenopathy are recommended. Biopsies have been scheduled for Friday, May 1st, at 2:30 p.m.  I have discussed the findings and recommendations with the patient. Results were also provided in writing at the conclusion of the visit. If applicable, a reminder letter will be sent to the patient regarding the next appointment.  BI-RADS CATEGORY  5: Highly suggestive of malignancy.   Electronically Signed   By: Curlene Dolphin M.D.   On: 09/30/2013 16:21   Mm Screening Breast Tomo Bilateral  09/22/2013   CLINICAL DATA:  Screening.  EXAM: DIGITAL SCREENING BILATERAL MAMMOGRAM WITH 3D TOMO WITH CAD  COMPARISON:  Previous Exam(s).  ACR Breast Density Category b: There are scattered areas of fibroglandular density.  FINDINGS: In the left breast, possible distortion warrants further evaluation with spot compression views and possibly ultrasound. A nearby group of calcifications warrant further evaluation with spot magnification views. In the right breast, no findings suspicious for malignancy. Images were processed with CAD.  IMPRESSION: Further evaluation is suggested for possible distortion and calcifications in the left breast.  RECOMMENDATION: Diagnostic mammogram and possibly ultrasound of the left  breast. (Code:FI-L-17M)  The patient will be contacted regarding the findings, and additional imaging will be scheduled.  BI-RADS CATEGORY  0: Incomplete. Need additional imaging evaluation and/or prior mammograms for comparison.   Electronically Signed   By: Evangeline Dakin M.D.   On: 09/22/2013 13:14   [place] Lt Breast Bx W Loc Dev 1st Lesion Img Bx Spec US Guide  10/06/2013   ADDENDUM REPORT: 10/06/2013 15:30  ADDENDUM: Pathology results of 2 left breast biopsies and a left axillary lymph node biopsy are received.  Pathology results of the left breast biopsy at 3 o'clock position 3 cm from the nipple show invasive ductal carcinoma and DCIS.  Pathology results of the mass at 2:30 position left breast 10 cm from the nipple show invasive ductal carcinoma and DCIS.  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  Pathology results for all 3 biopsies are concordant.  The patient reports doing well after the biopsies.  The patient has been scheduled for bilateral breast MRI on 10/10/2013 at 9:30 a.m. she is scheduled for the Multidisciplinary Breast Cancer Clinic at [facility] on 10/15/2013. The patient was given both of these dates. I have made her aware that there are free information materials available at the [place] about Breast Cancer that she can pick up.   Electronically Signed   By: Curlene Dolphin M.D.   On: 10/06/2013 15:30   10/06/2013   CLINICAL DATA:  Suspicious masses in the outer left breast and suspicious left axillary lymph node. A total of 3 biopsies were performed today. This biopsy describes the mass at 3 o'clock position approximately 3 cm from nipple that measures 7 x 5 x 9 mm.  EXAM: ULTRASOUND GUIDED LEFT BREAST CORE NEEDLE BIOPSY  COMPARISON:  Previous exams.  PROCEDURE: I met with the patient and we discussed the procedure of ultrasound-guided biopsy, including benefits and alternatives. We discussed the high likelihood of a  successful procedure. We discussed the risks of the procedure including infection, bleeding, tissue injury, clip migration, and inadequate sampling. Informed written consent was given. The usual time-out protocol was performed immediately prior to the procedure.  Using sterile technique and 2% Lidocaine as local anesthetic, under direct ultrasound visualization, a 12 gauge vacuum-assisteddevice was used to perform biopsy of a 9 mm mass at 3 o'clock position 3 cm from the nipple using a lateral to medial approach. At the conclusion of the procedure, a coil shaped tissue marker clip was deployed into the biopsy cavity. Follow-up 2-view mammogram was performed and dictated separately.  IMPRESSION: Ultrasound-guided biopsy of left breast mass at 3 o'clock position 3 cm from the nipple. No apparent complications.  Electronically Signed: By: Curlene Dolphin M.D. On: 10/03/2013 16:26   [place] Lt Breast Bx W Loc Dev Ea Add Lesion Img Bx Spec US Guide  10/06/2013   ADDENDUM REPORT: 10/06/2013 15:31  ADDENDUM: Pathology  results of 2 left breast biopsies and a left axillary lymph node biopsy are received.  Pathology results of the left breast biopsy at 3 o'clock position 3 cm from the nipple show invasive ductal carcinoma and DCIS.  Pathology results of the mass at 2:30 position left breast 10 cm from the nipple show invasive ductal carcinoma and DCIS.  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  Pathology results for all 3 biopsies are concordant.  The patient reports doing well after the biopsies.  The patient has been scheduled for bilateral breast MRI on 10/10/2013 at 9:30 a.m. she is scheduled for the Multidisciplinary Breast Cancer Clinic at [facility] on 10/15/2013. The patient was given both of these dates. I have made her aware that there are free information materials available at the [place] about Breast Cancer that she can pick up.   Electronically  Signed   By: Curlene Dolphin M.D.   On: 10/06/2013 15:31   10/06/2013   CLINICAL DATA:  Patient presents for biopsy of 2 suspicious left breast masses and is suspicious left axillary lymph node. This biopsied describes a report of the biopsy of the palpable left axillary lymph node.  EXAM: ULTRASOUND GUIDED CORE NEEDLE BIOPSY OF A LEFT AXILLARY NODE  COMPARISON:  Previous exams.  FINDINGS: I met with the patient and we discussed the procedure of ultrasound-guided biopsy, including benefits and alternatives. We discussed the high likelihood of a successful procedure. We discussed the risks of the procedure, including infection, bleeding, tissue injury, clip migration, and inadequate sampling. Informed written consent was given. The usual time-out protocol was performed immediately prior to the procedure.  Using sterile technique and 2% Lidocaine as local anesthetic, under direct ultrasound visualization, a 9 gauge vacuum assisted device was used to perform biopsy of an enlarged lymph node with a thickened cortex using a lateral to medial approach.  IMPRESSION: Ultrasound guided biopsy of suspicious left axillary lymph node. No apparent complications.  Electronically Signed: By: Curlene Dolphin M.D. On: 10/03/2013 16:28   [place] Lt Breast Bx W Loc Dev Ea Add Lesion Img Bx Spec US Guide  10/06/2013   ADDENDUM REPORT: 10/06/2013 15:30  ADDENDUM: Pathology results of 2 left breast biopsies and a left axillary lymph node biopsy are received.  Pathology results of the left breast biopsy at 3 o'clock position 3 cm from the nipple show invasive ductal carcinoma and DCIS.  Pathology results of the mass at 2:30 position left breast 10 cm from the nipple show invasive ductal carcinoma and DCIS.  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  Pathology results for all 3 biopsies are concordant.  The patient reports doing well after the biopsies.  The patient has been scheduled for  bilateral breast MRI on 10/10/2013 at 9:30 a.m. she is scheduled for the Multidisciplinary Breast Cancer Clinic at [facility] on 10/15/2013. The patient was given both of these dates. I have made her aware that there are free information materials available at the [place] about Breast Cancer that she can pick up.   Electronically Signed   By: Curlene Dolphin M.D.   On: 10/06/2013 15:30   10/06/2013   CLINICAL DATA:  Patient presents for total of 3 biopsies today. This report describes biopsy of the palpable left breast mass centered at 2:30 position 10 cm from the nipple.  EXAM: ULTRASOUND GUIDED LEFT BREAST CORE NEEDLE  BIOPSY  COMPARISON:  Previous exams.  PROCEDURE: I met with the patient and we discussed the procedure of ultrasound-guided biopsy, including benefits and alternatives. We discussed the high likelihood of a successful procedure. We discussed the risks of the procedure including infection, bleeding, tissue injury, clip migration, and inadequate sampling. Informed written consent was given. The usual time-out protocol was performed immediately prior to the procedure.  Using sterile technique and 2% Lidocaine as local anesthetic, under direct ultrasound visualization, a 12 gauge vacuum-assisteddevice was used to perform biopsy of a palpable mass at 2:30 position 10 cm from the nipple using a lateral to medial approach. At the conclusion of the procedure, a ribbon shaped tissue marker clip was deployed into the biopsy cavity. Follow-up 2-view mammogram was performed and dictated separately.  IMPRESSION: Ultrasound-guided biopsy of left breast mass 2:30 position 10 cm from the nipple. No apparent complications.  Electronically Signed: By: Curlene Dolphin M.D. On: 10/03/2013 16:27      ASSESSMENT AND PLAN: Breast cancer of upper-outer quadrant of left female breast Patient appears to have a T3 N1 new left breast cancer.  She will undergo staging studies. Assuming this is negative, she  will discuss with Dr. Jana Hakim whether she should undergo neoadjuvant chemotherapy to see if she has an improvement in her nodal disease. If she had a complete pathologic response in the axilla, she would be eligible for the Alliance trial to evaluate her for a sentinel lymph node biopsy after chemotherapy. If she does not desire to do this, she would need a left-sided mastectomy with axillary lymph node dissection and Port-A-Cath placement.    She is not a candidate for immediate reconstruction because of the size of her mass and nodal status. She will need radiation postoperatively no matter what. She was also advised to stop smoking.    She is highly motivated as she is having her first grandchild in September.    I discussed the risks of Port-A-Cath placement. I showed the patient educational material regarding the port.  She was advised about the risk of pneumothorax, malposition, malfunction, blood clot.    45 min spent in evaluation, examination, counseling, and coordination of care.    >50% spent in counseling.          Milus Height MD Surgical Oncology, General and [facility]      Visit Diagnoses: 1. Breast cancer of upper-outer quadrant of left female breast     Primary Care Physician: No PCP Per Patient  Benjie Karvonen, Sheryle Spray, susan magrinat wentworth

## 2013-10-15 NOTE — Progress Notes (Signed)
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 female  who was screened for a left breast mass about a year ago. She was told that the abnormality cleared and was sent for screening. She presented for screening earlier this month and was noted at that time to have a palpable mass and palpable mass in her axilla. She states her left breast mass and been there for a couple months. Due to her history of "lumpy" breasts she really was unsure if this is normal for her or was a mass. She has been sore in her axilla since her biopsy. He has no personal history of breast cancer. No family history of breast cancer. Her mammogram showed a mass in the upper outer quadrant as well as 2 additional areas 1 of calcifications about 4 cm anterior to the biopsied mass and then further anterior was another mass measuring about 9 mm in greatest dimension. MRI showed a total area of 12.7 cm of multiple masses as well as a 3.6 cm lymph node. Multiple other level II and level I lymph nodes were noted. The clips of the 2 masses were biopsied were about 8.1 cm apart. The masses were identical both showing a grade 2 invasive ductal cancer ER/PR positive HER-2 negative Ki-67 20%. The lymph node was also biopsied and was negative. He presents for clinic today. Her port nipple discharge. No history of radiation. She is GX P2. She is still menstruating.  She recently started a job and has now [employer] coverage as of yet. She is unaccompanied in clinic today. Denies any headaches. She was having weight loss until the first of the year but that has stabilized. She has no bone pain.  PREVIOUS RADIATION THERAPY: No  PAST MEDICAL HISTORY:  has a past medical history of Diabetes mellitus; Hypertension; Sarcoidosis; Anxiety; Depression; and Anemia.    PAST SURGICAL HISTORY: Past Surgical History  Procedure Laterality Date  . Uterine fibroid surgery    . Tubal ligation  2001  . Endobronchial ultrasound Bilateral 12/02/2012    Procedure: ENDOBRONCHIAL ULTRASOUND;  Surgeon: Collene Gobble, MD;  Location: [facility];  Service: Cardiopulmonary;  Laterality: Bilateral;    FAMILY HISTORY:  Family History  Problem Relation Age of Onset  . Asthma Mother     SOCIAL HISTORY:  History  Substance Use Topics  . Smoking status: Current Every Day Smoker -- 1.00 packs/day for 23 years    Types: Cigarettes  . Smokeless tobacco: Never Used  . Alcohol Use: 1.0 oz/week    2 drink(s) per week     Comment: occ. x2 dks per week    ALLERGIES: Review of patient's allergies indicates no known allergies.  MEDICATIONS:  Current Outpatient Prescriptions  Medication Sig Dispense Refill  . HYDROcodone-acetaminophen (NORCO/VICODIN) 5-325 MG per tablet Take 1 tablet by mouth every 4 (four) hours as needed for moderate pain.  20 tablet  0  .  metroNIDAZOLE (FLAGYL) 500 MG tablet       . norethindrone (AYGESTIN) 5 MG tablet        No current facility-administered medications for this encounter.    REVIEW OF SYSTEMS:  A 15 point review of systems is documented in the electronic medical record. This was obtained by the nursing staff. However, I reviewed this with the patient to discuss relevant findings and make appropriate changes.  Pertinent items are noted in HPI.  PHYSICAL EXAM:  Pleasant female in no distress sitting comfortably examining table. Large pendulous  breasts bilaterally. She has a palpable mass at approximately the 3:00 position of the left breast just posterior to the nipple. She has also a palpable mass in the upper outer quadrant. The lymph node is low in her axilla and is palpable and tender. She has no lymphedema. She has 5 out of 5 strength bilaterally.  LABORATORY DATA:  Lab Results  Component Value Date   WBC 4.9 10/15/2013   HGB 9.5* 10/15/2013   HCT 30.3* 10/15/2013   MCV 77.5* 10/15/2013   PLT 240 10/15/2013   Lab Results  Component Value Date   NA 141 10/15/2013   K 3.4* 10/15/2013   CL 103 11/27/2012   CO2 19* 10/15/2013   Lab Results  Component Value Date   ALT 20 10/15/2013   AST 19 10/15/2013   ALKPHOS 56 10/15/2013   BILITOT 0.34 10/15/2013     RADIOGRAPHY: Mr Breast Bilateral W Wo Contrast  10/10/2013   CLINICAL DATA:  Recently diagnosed left breast cancer. Ultrasound-guided core biopsy of 3 o'clock location 3 cm from the nipple shows invasive ductal carcinoma and ductal carcinoma in situ. Biopsy of the 230 o'clock location 10 cm from the nipple shows invasive ductal carcinoma and ductal carcinoma in situ. Biopsy of left axillary lymph node shows metastatic disease.  LABS:  Not applicable  EXAM: BILATERAL BREAST MRI WITH AND WITHOUT CONTRAST  TECHNIQUE: Multiplanar, multisequence MR images of both breasts were obtained prior to and following the intravenous administration of ml of MultiHance.  THREE-DIMENSIONAL MR IMAGE RENDERING ON INDEPENDENT WORKSTATION:  Three-dimensional MR images were rendered by post-processing of the original MR data on an independent workstation. The three-dimensional MR images were interpreted, and findings are reported in the following complete MRI report for this study. Three dimensional images were evaluated at the independent DynaCad workstation  COMPARISON:  Previous exams  FINDINGS: Breast composition: b.  Scattered fibroglandular tissue.  Background parenchymal enhancement: Mild  Right breast: No  mass or abnormal enhancement.  Left breast: There are numerous enhancing masses of bearing sizes within the lateral aspect of the left breast. The largest is 2.4 x 2.8 x 3.5 cm and demonstrates washout type enhancement. Within this mass there is associated with clip artifact following recent biopsy. Measured together, the enhancing region is 12.7 x 4.5 x 3.8 cm.  Lymph nodes: Numerous enlarged left axillary lymph nodes are identified. At least 5 enlarged nodes are seen, involving both level 1 and level 2 nodal stations. The largest is in the level 1 lower axilla measuring 2.3 cm. Post biopsy changes are identified in this region. The right axilla and internal mammary regions are unremarkable.  Ancillary findings:  None.  IMPRESSION: 1. MRI evidence for significant left breast malignancy, measuring at least 12.7 cm in anterior-posterior extent. 2. Enlarged left level 1 and level 2 axillary lymph nodes. 3. Right breast is negative.  RECOMMENDATION: Treatment plan.  BI-RADS CATEGORY  6: Known biopsy-proven malignancy.  Electronically Signed   By: Shon Hale M.D.   On: 10/10/2013 11:45    IMPRESSION: T3N1 Invasive Ductal Carcinoma of the Left Breast  PLAN: I spoke with Ms. Cothran today regarding the role of radiation in the management of her disease. Due to the size of the area in her breast despite neoadjuvant chemotherapy we would recommend mastectomy. Whether to proceed forward with initial mastectomy followed by chemotherapy or neoadjuvant chemotherapy followed by mastectomy I think she would benefit from postmastectomy radiation. We discussed the doing neoadjuvant chemotherapy would not improve or decrease her survival. It may make her a candidate providing she does not have metastatic disease for a study looking at sentinel lymph node biopsy of a low in in patients who have a pathologic complete response to chemotherapy. He has a positive sentinel lymph node at the time of surgery she would also be a  candidate for study looking at axillary node dissection versus radiation. This is all of course predicated on the fact she does metastatic disease for which staging scans are pending. At this point if she has neoadjuvant chemotherapy I would see her back for discussion of clinical trials towards the end of her treatments. We discussed in broad terms the process of radiation and the placement tattoos. We discussed again darkening and fatigue as possible side effects. I will plan on seeing her back again towards the middle to end of her chemotherapy provided she does not have metastatic disease. She met with physical therapy, surgery, medical oncology, and a member of our patient family support staff.  I spent 60 minutes  face to face with the patient and more than 50% of that time was spent in counseling and/or coordination of care.   ------------------------------------------------  Thea Silversmith, MD

## 2013-10-16 ENCOUNTER — Encounter: Payer: Self-pay | Admitting: Genetic Counselor

## 2013-10-16 ENCOUNTER — Other Ambulatory Visit: Payer: No Typology Code available for payment source

## 2013-10-16 ENCOUNTER — Ambulatory Visit (HOSPITAL_BASED_OUTPATIENT_CLINIC_OR_DEPARTMENT_OTHER): Payer: No Typology Code available for payment source | Admitting: Genetic Counselor

## 2013-10-16 DIAGNOSIS — IMO0002 Reserved for concepts with insufficient information to code with codable children: Secondary | ICD-10-CM

## 2013-10-16 DIAGNOSIS — C50412 Malignant neoplasm of upper-outer quadrant of left female breast: Secondary | ICD-10-CM

## 2013-10-16 DIAGNOSIS — Z800 Family history of malignant neoplasm of digestive organs: Secondary | ICD-10-CM

## 2013-10-16 DIAGNOSIS — C50419 Malignant neoplasm of upper-outer quadrant of unspecified female breast: Secondary | ICD-10-CM

## 2013-10-16 NOTE — Progress Notes (Signed)
Patient Name: Shelly Hall Patient Age: 43 y.o. Encounter Date: 10/16/2013  Referring Physician: Lurline Del, MD   Shelly Hall, a 43 y.o. female, is being seen at the [facility] due to a personal history of breast cancer.  She presents to clinic today to discuss the possibility of a hereditary predisposition to cancer and discuss whether genetic testing is warranted.  HISTORY OF PRESENT ILLNESS: Shelly Hall was recently diagnosed with left breast cancer (IDC) at the age of 68. The breast tumor was ER positive, PR positive, and HER2 negative. She stated she is scheduled to start neoadjuvant chemotherapy on 5/28 and will then have a left mastectomy.   Shelly Hall reports a history of cervical dysplasia at age 25.   Past Medical History  Diagnosis Date  . Diabetes mellitus   . Hypertension   . Sarcoidosis   . Anxiety   . Depression   . Anemia     Past Surgical History  Procedure Laterality Date  . Uterine fibroid surgery    . Tubal ligation  2001  . Endobronchial ultrasound Bilateral 12/02/2012    Procedure: ENDOBRONCHIAL ULTRASOUND;  Surgeon: Collene Gobble, MD;  Location: [facility];  Service: Cardiopulmonary;  Laterality: Bilateral;    FAMILY HISTORY:   During the visit, a 4-generation pedigree was obtained. Significant diagnoses include the following:  Family History  Problem Relation Age of Onset  . Cancer Paternal Aunt     "bone cancer"; deceased 33s  . Cancer Paternal Uncle     stomach cancer; deceased 7s  . Cancer Paternal Aunt     unk. primary; currently 38s  . Prostate cancer Maternal Grandfather 70    Additionally, Shelly Hall reports she has many paternal cousins with no known history of cancer. Her paternal grandparents died in their 27s, cancer-free. Her mother is 3 and cancer-free. Her mother has 6 full sisters and a full brother who are all cancer-free. Shelly Hall maternal grandfather is in his 5s with a history of  prostate cancer at 40.  Shelly Hall ancestry is African American. There is no known Jewish ancestry and no consanguinity.  ASSESSMENT AND PLAN: Shelly Hall is a 43 y.o. female with a personal history of breast cancer diagnosed recently at age 10. This history is not highly suggestive of a hereditary predisposition to cancer, but there are two paternal aunts who had cancer and she does not know the primary site. We reviewed the characteristics, features and inheritance patterns of hereditary cancer syndromes. We also discussed genetic testing, including the process of testing, insurance coverage and implications of results.   Shelly Hall wished to pursue genetic testing and a blood sample will be sent to [facility] for analysis of 17 genes on the BreastNext panel. We discussed the implications of a positive, negative and/ or Variant of Uncertain Significance (VUS) result. Results should be available in approximately 4-5 weeks, at which point we will contact her and address implications for her as well as address genetic testing for at-risk family members, if needed.    We encouraged Shelly Hall to remain in contact with Cancer Genetics annually so that we can update the family history and inform her of any changes in cancer genetics and testing that may be of benefit for this family. Ms.  Hall questions were answered to her satisfaction today.   Thank you for the referral and allowing [place] to share in the care of your patient.   The patient was seen for a  total of 30 minutes, greater than 50% of which was spent face-to-face counseling. This patient was discussed with the referring provider who agrees with the above.

## 2013-10-17 ENCOUNTER — Encounter (HOSPITAL_COMMUNITY): Payer: Self-pay | Admitting: Pharmacy Technician

## 2013-10-17 ENCOUNTER — Encounter: Payer: Self-pay | Admitting: Oncology

## 2013-10-17 NOTE — Progress Notes (Signed)
Pt is approved for the $1000 [employer] grant.  

## 2013-10-20 ENCOUNTER — Ambulatory Visit: Payer: Self-pay

## 2013-10-21 ENCOUNTER — Telehealth: Payer: Self-pay | Admitting: *Deleted

## 2013-10-21 ENCOUNTER — Encounter: Payer: Self-pay | Admitting: *Deleted

## 2013-10-21 ENCOUNTER — Other Ambulatory Visit: Payer: No Typology Code available for payment source

## 2013-10-21 ENCOUNTER — Encounter (HOSPITAL_COMMUNITY): Payer: Self-pay

## 2013-10-21 ENCOUNTER — Encounter (HOSPITAL_COMMUNITY)
Admission: RE | Admit: 2013-10-21 | Discharge: 2013-10-21 | Disposition: A | Discharge status: Home / Self Care | Payer: No Typology Code available for payment source | Source: Ambulatory Visit | Attending: General Surgery | Admitting: General Surgery

## 2013-10-21 DIAGNOSIS — D649 Anemia, unspecified: Secondary | ICD-10-CM | POA: Diagnosis not present

## 2013-10-21 DIAGNOSIS — D869 Sarcoidosis, unspecified: Secondary | ICD-10-CM | POA: Diagnosis not present

## 2013-10-21 DIAGNOSIS — I10 Essential (primary) hypertension: Secondary | ICD-10-CM | POA: Diagnosis not present

## 2013-10-21 DIAGNOSIS — F329 Major depressive disorder, single episode, unspecified: Secondary | ICD-10-CM | POA: Diagnosis not present

## 2013-10-21 DIAGNOSIS — F411 Generalized anxiety disorder: Secondary | ICD-10-CM | POA: Diagnosis not present

## 2013-10-21 DIAGNOSIS — E119 Type 2 diabetes mellitus without complications: Secondary | ICD-10-CM | POA: Diagnosis not present

## 2013-10-21 DIAGNOSIS — F3289 Other specified depressive episodes: Secondary | ICD-10-CM | POA: Diagnosis not present

## 2013-10-21 DIAGNOSIS — Z79899 Other long term (current) drug therapy: Secondary | ICD-10-CM | POA: Diagnosis not present

## 2013-10-21 DIAGNOSIS — C50919 Malignant neoplasm of unspecified site of unspecified female breast: Secondary | ICD-10-CM | POA: Diagnosis present

## 2013-10-21 HISTORY — DX: Adverse effect of unspecified anesthetic, initial encounter: T41.45XA

## 2013-10-21 HISTORY — DX: Other complications of anesthesia, initial encounter: T88.59XA

## 2013-10-21 LAB — BASIC METABOLIC PANEL
BUN: 16 mg/dL (ref 6–23)
CALCIUM: 9.9 mg/dL (ref 8.4–10.5)
CO2: 21 mEq/L (ref 19–32)
Chloride: 105 mEq/L (ref 96–112)
Creatinine, Ser: 0.95 mg/dL (ref 0.50–1.10)
GFR, EST AFRICAN AMERICAN: 83 mL/min — AB (ref >90)
GFR, EST NON AFRICAN AMERICAN: 72 mL/min — AB (ref >90)
Glucose, Bld: 107 mg/dL — ABNORMAL HIGH (ref 70–99)
POTASSIUM: 3.9 meq/L (ref 3.7–5.3)
SODIUM: 140 meq/L (ref 137–147)

## 2013-10-21 LAB — CBC
HCT: 33.5 % — ABNORMAL LOW (ref 36.0–46.0)
HEMOGLOBIN: 10.4 g/dL — AB (ref 12.0–15.0)
MCH: 25.4 pg — ABNORMAL LOW (ref 26.0–34.0)
MCHC: 31.6 g/dL (ref 30.0–36.0)
MCV: 80.3 fL (ref 78.0–100.0)
PLATELETS: 256 10*3/uL (ref 150–400)
RBC: 4.17 MIL/uL (ref 3.87–5.11)
WBC: 5.4 10*3/uL (ref 4.0–10.5)

## 2013-10-21 LAB — HCG, SERUM, QUALITATIVE: Preg, Serum: NEGATIVE

## 2013-10-21 MED ORDER — CEFAZOLIN SODIUM-DEXTROSE 2-3 GM-% IV SOLR
2.0000 g | INTRAVENOUS | Status: AC
  Administered 2013-10-22: 2 g via INTRAVENOUS
  Filled 2013-10-21: qty 50

## 2013-10-21 NOTE — Telephone Encounter (Signed)
Spoke to pt concerning [place] from 10/15/13.  Pt denies questions or concerns at this time.  Confirmed chemo ed class and f/u with Dr. Jana Hakim.  Encourage pt to call with needs.  Received verbal understanding.  Contact information given.

## 2013-10-21 NOTE — Pre-Procedure Instructions (Signed)
Shelly Hall  10/21/2013   Your procedure is scheduled on:  10-22-2013     Report to [facility] Admitting at 6:30 AM.   Call this number if you have problems the morning of surgery: [phone]   Remember:   Do not eat food or drink liquids after midnight.    Take these medicines the morning of surgery with A SIP OF WATER: inhaler as needed,Atenolol-chlorthalidone(Tenoretic),norethindroneAygestin)      Do not wear jewelry, make-up or nail polish.  Do not wear lotions, powders, or perfumes.   Do not shave 48 hours prior to surgery. Men may shave face and neck.  Do not bring valuables to the hospital.  [facility] is not responsible for any belongings or valuables.               Contacts, dentures or bridgework may not be worn into surgery.                   Patients discharged the day of surgery will not be allowed to drive home.   Name and phone number of your driver:    Special Instructions: See attached sheet for instructions on CHG shower/bath   Please read over the following fact sheets that you were given: Pain Booklet and Surgical Site Infection Prevention

## 2013-10-22 ENCOUNTER — Encounter (HOSPITAL_COMMUNITY)
Admission: RE | Disposition: A | Discharge status: Home / Self Care | Payer: Self-pay | Source: Ambulatory Visit | Attending: General Surgery

## 2013-10-22 ENCOUNTER — Ambulatory Visit (HOSPITAL_COMMUNITY): Payer: No Typology Code available for payment source | Admitting: Anesthesiology

## 2013-10-22 ENCOUNTER — Ambulatory Visit (HOSPITAL_COMMUNITY): Payer: No Typology Code available for payment source

## 2013-10-22 ENCOUNTER — Ambulatory Visit (HOSPITAL_COMMUNITY)
Admission: RE | Admit: 2013-10-22 | Discharge: 2013-10-22 | Disposition: A | Discharge status: Home / Self Care | Payer: No Typology Code available for payment source | Source: Ambulatory Visit | Attending: General Surgery | Admitting: General Surgery

## 2013-10-22 ENCOUNTER — Encounter (HOSPITAL_COMMUNITY): Payer: Self-pay | Admitting: Anesthesiology

## 2013-10-22 ENCOUNTER — Encounter (HOSPITAL_COMMUNITY): Payer: No Typology Code available for payment source | Admitting: Anesthesiology

## 2013-10-22 DIAGNOSIS — F3289 Other specified depressive episodes: Secondary | ICD-10-CM | POA: Insufficient documentation

## 2013-10-22 DIAGNOSIS — C50919 Malignant neoplasm of unspecified site of unspecified female breast: Secondary | ICD-10-CM

## 2013-10-22 DIAGNOSIS — E119 Type 2 diabetes mellitus without complications: Secondary | ICD-10-CM | POA: Insufficient documentation

## 2013-10-22 DIAGNOSIS — I10 Essential (primary) hypertension: Secondary | ICD-10-CM | POA: Insufficient documentation

## 2013-10-22 DIAGNOSIS — D649 Anemia, unspecified: Secondary | ICD-10-CM | POA: Insufficient documentation

## 2013-10-22 DIAGNOSIS — F329 Major depressive disorder, single episode, unspecified: Secondary | ICD-10-CM | POA: Insufficient documentation

## 2013-10-22 DIAGNOSIS — F411 Generalized anxiety disorder: Secondary | ICD-10-CM | POA: Insufficient documentation

## 2013-10-22 DIAGNOSIS — Z79899 Other long term (current) drug therapy: Secondary | ICD-10-CM | POA: Insufficient documentation

## 2013-10-22 DIAGNOSIS — D869 Sarcoidosis, unspecified: Secondary | ICD-10-CM | POA: Insufficient documentation

## 2013-10-22 HISTORY — PX: PORTACATH PLACEMENT: SHX2246

## 2013-10-22 LAB — GLUCOSE, CAPILLARY
GLUCOSE-CAPILLARY: 114 mg/dL — AB (ref 70–99)
Glucose-Capillary: 99 mg/dL (ref 70–99)

## 2013-10-22 SURGERY — INSERTION, TUNNELED CENTRAL VENOUS DEVICE, WITH PORT
Anesthesia: General

## 2013-10-22 MED ORDER — ONDANSETRON HCL 4 MG/2ML IJ SOLN: 4.0000 mg | Freq: Once | INTRAMUSCULAR | Status: DC | PRN

## 2013-10-22 MED ORDER — FENTANYL CITRATE 0.05 MG/ML IJ SOLN
INTRAMUSCULAR | Status: AC
  Filled 2013-10-22: qty 5

## 2013-10-22 MED ORDER — OXYCODONE-ACETAMINOPHEN 5-325 MG PO TABS: 1.0000 | ORAL_TABLET | ORAL | Status: DC | PRN

## 2013-10-22 MED ORDER — LIDOCAINE HCL (CARDIAC) 20 MG/ML IV SOLN
INTRAVENOUS | Status: DC | PRN
  Administered 2013-10-22: 75 mg via INTRAVENOUS

## 2013-10-22 MED ORDER — PROPOFOL 10 MG/ML IV BOLUS
INTRAVENOUS | Status: DC | PRN
  Administered 2013-10-22: 200 mg via INTRAVENOUS

## 2013-10-22 MED ORDER — LACTATED RINGERS IV SOLN
INTRAVENOUS | Status: DC | PRN
  Administered 2013-10-22: 09:00:00 via INTRAVENOUS

## 2013-10-22 MED ORDER — HEPARIN SOD (PORK) LOCK FLUSH 100 UNIT/ML IV SOLN
INTRAVENOUS | Status: AC
  Filled 2013-10-22: qty 5

## 2013-10-22 MED ORDER — OXYCODONE HCL 5 MG PO TABS
5.0000 mg | ORAL_TABLET | Freq: Once | ORAL | Status: AC | PRN
  Administered 2013-10-22: 5 mg via ORAL

## 2013-10-22 MED ORDER — 0.9 % SODIUM CHLORIDE (POUR BTL) OPTIME
TOPICAL | Status: DC | PRN
Start: 2013-10-22 — End: 2013-10-22
  Administered 2013-10-22: 1000 mL

## 2013-10-22 MED ORDER — PROPOFOL 10 MG/ML IV BOLUS
INTRAVENOUS | Status: AC
  Filled 2013-10-22: qty 20

## 2013-10-22 MED ORDER — FENTANYL CITRATE 0.05 MG/ML IJ SOLN
INTRAMUSCULAR | Status: DC | PRN
  Administered 2013-10-22: 100 ug via INTRAVENOUS

## 2013-10-22 MED ORDER — HYDROMORPHONE HCL PF 1 MG/ML IJ SOLN
0.2500 mg | INTRAMUSCULAR | Status: DC | PRN
  Administered 2013-10-22 (×2): 0.5 mg via INTRAVENOUS

## 2013-10-22 MED ORDER — BUPIVACAINE-EPINEPHRINE (PF) 0.25% -1:200000 IJ SOLN
INTRAMUSCULAR | Status: AC
  Filled 2013-10-22: qty 30

## 2013-10-22 MED ORDER — OXYCODONE HCL 5 MG PO TABS
ORAL_TABLET | ORAL | Status: AC
  Filled 2013-10-22: qty 1

## 2013-10-22 MED ORDER — MIDAZOLAM HCL 2 MG/2ML IJ SOLN
INTRAMUSCULAR | Status: AC
  Filled 2013-10-22: qty 2

## 2013-10-22 MED ORDER — LIDOCAINE HCL (CARDIAC) 20 MG/ML IV SOLN
INTRAVENOUS | Status: AC
  Filled 2013-10-22: qty 5

## 2013-10-22 MED ORDER — MIDAZOLAM HCL 5 MG/5ML IJ SOLN
INTRAMUSCULAR | Status: DC | PRN
  Administered 2013-10-22: 2 mg via INTRAVENOUS

## 2013-10-22 MED ORDER — HYDROMORPHONE HCL PF 1 MG/ML IJ SOLN
INTRAMUSCULAR | Status: DC
Start: 2013-10-22 — End: 2013-10-22
  Filled 2013-10-22: qty 1

## 2013-10-22 MED ORDER — SODIUM CHLORIDE 0.9 % IR SOLN
Status: DC | PRN
  Administered 2013-10-22: 08:00:00

## 2013-10-22 MED ORDER — ONDANSETRON HCL 4 MG/2ML IJ SOLN
INTRAMUSCULAR | Status: DC | PRN
  Administered 2013-10-22: 4 mg via INTRAVENOUS

## 2013-10-22 MED ORDER — ONDANSETRON HCL 4 MG/2ML IJ SOLN
INTRAMUSCULAR | Status: AC
  Filled 2013-10-22: qty 2

## 2013-10-22 MED ORDER — OXYCODONE HCL 5 MG/5ML PO SOLN: 5.0000 mg | Freq: Once | ORAL | Status: AC | PRN

## 2013-10-22 MED ORDER — LIDOCAINE HCL (PF) 1 % IJ SOLN
INTRAMUSCULAR | Status: AC
  Filled 2013-10-22: qty 30

## 2013-10-22 MED ORDER — LIDOCAINE HCL 1 % IJ SOLN
INTRAMUSCULAR | Status: DC | PRN
  Administered 2013-10-22: 10:00:00 via SUBCUTANEOUS

## 2013-10-22 MED ORDER — HEPARIN SOD (PORK) LOCK FLUSH 100 UNIT/ML IV SOLN
INTRAVENOUS | Status: DC | PRN
  Administered 2013-10-22: 500 [IU] via INTRAVENOUS

## 2013-10-22 SURGICAL SUPPLY — 50 items
ADH SKN CLS APL DERMABOND .7 (GAUZE/BANDAGES/DRESSINGS) ×1
APPLICATOR COTTON TIP 6IN STRL (MISCELLANEOUS) ×2 IMPLANT
BAG DECANTER FOR FLEXI CONT (MISCELLANEOUS) ×3 IMPLANT
CANISTER SUCTION 2500CC (MISCELLANEOUS) IMPLANT
CHLORAPREP W/TINT 10.5 ML (MISCELLANEOUS) ×3 IMPLANT
COVER SURGICAL LIGHT HANDLE (MISCELLANEOUS) ×3 IMPLANT
COVER TRANSDUCER ULTRASND (DRAPES) IMPLANT
CRADLE DONUT ADULT HEAD (MISCELLANEOUS) ×3 IMPLANT
DECANTER SPIKE VIAL GLASS SM (MISCELLANEOUS) ×6 IMPLANT
DERMABOND ADVANCED (GAUZE/BANDAGES/DRESSINGS) ×2
DERMABOND ADVANCED .7 DNX12 (GAUZE/BANDAGES/DRESSINGS) ×1 IMPLANT
DRAPE C-ARM 42X72 X-RAY (DRAPES) ×3 IMPLANT
DRAPE CHEST BREAST 15X10 FENES (DRAPES) ×3 IMPLANT
DRAPE UTILITY 15X26 W/TAPE STR (DRAPE) ×6 IMPLANT
DRAPE WARM FLUID 44X44 (DRAPE) IMPLANT
ELECT COATED BLADE 2.86 ST (ELECTRODE) ×3 IMPLANT
ELECT REM PT RETURN 9FT ADLT (ELECTROSURGICAL) ×3
ELECTRODE REM PT RTRN 9FT ADLT (ELECTROSURGICAL) ×1 IMPLANT
GAUZE SPONGE 4X4 16PLY XRAY LF (GAUZE/BANDAGES/DRESSINGS) ×3 IMPLANT
GLOVE BIO SURGEON STRL SZ 6 (GLOVE) ×3 IMPLANT
GLOVE BIOGEL PI IND STRL 6.5 (GLOVE) ×1 IMPLANT
GLOVE BIOGEL PI INDICATOR 6.5 (GLOVE) ×2
GOWN STRL REUS W/ TWL LRG LVL3 (GOWN DISPOSABLE) ×1 IMPLANT
GOWN STRL REUS W/TWL 2XL LVL3 (GOWN DISPOSABLE) ×3 IMPLANT
GOWN STRL REUS W/TWL LRG LVL3 (GOWN DISPOSABLE) ×3
KIT BASIN OR (CUSTOM PROCEDURE TRAY) ×3 IMPLANT
KIT PORT POWER 8FR ISP CVUE (Catheter) ×2 IMPLANT
KIT PORT POWER 9.6FR MRI PREA (Catheter) IMPLANT
KIT PORT POWER ISP 8FR (Catheter) IMPLANT
KIT POWER CATH 8FR (Catheter) IMPLANT
KIT ROOM TURNOVER OR (KITS) ×3 IMPLANT
NDL HYPO 25GX1X1/2 BEV (NEEDLE) ×1 IMPLANT
NEEDLE HYPO 25GX1X1/2 BEV (NEEDLE) ×3 IMPLANT
NS IRRIG 1000ML POUR BTL (IV SOLUTION) ×3 IMPLANT
PACK SURGICAL SETUP 50X90 (CUSTOM PROCEDURE TRAY) ×3 IMPLANT
PAD ARMBOARD 7.5X6 YLW CONV (MISCELLANEOUS) ×3 IMPLANT
PENCIL BUTTON HOLSTER BLD 10FT (ELECTRODE) ×3 IMPLANT
SUT MON AB 4-0 PC3 18 (SUTURE) ×3 IMPLANT
SUT PROLENE 2 0 SH DA (SUTURE) ×6 IMPLANT
SUT VIC AB 3-0 SH 27 (SUTURE) ×3
SUT VIC AB 3-0 SH 27X BRD (SUTURE) ×1 IMPLANT
SYR 20ML ECCENTRIC (SYRINGE) ×6 IMPLANT
SYR 5ML LUER SLIP (SYRINGE) ×3 IMPLANT
SYR CONTROL 10ML LL (SYRINGE) ×3 IMPLANT
TOWEL OR 17X24 6PK STRL BLUE (TOWEL DISPOSABLE) ×3 IMPLANT
TOWEL OR 17X26 10 PK STRL BLUE (TOWEL DISPOSABLE) ×3 IMPLANT
TUBE CONNECTING 12'X1/4 (SUCTIONS)
TUBE CONNECTING 12X1/4 (SUCTIONS) IMPLANT
WATER STERILE IRR 1000ML POUR (IV SOLUTION) IMPLANT
YANKAUER SUCT BULB TIP NO VENT (SUCTIONS) IMPLANT

## 2013-10-22 NOTE — Discharge Instructions (Signed)
[facility] °Office Phone Number [phone] ° ° POST OP INSTRUCTIONS ° °Always review your discharge instruction sheet given to you by the facility where your surgery was performed. ° °IF YOU HAVE DISABILITY OR FAMILY LEAVE FORMS, YOU MUST BRING THEM TO THE OFFICE FOR PROCESSING.  DO NOT GIVE THEM TO YOUR DOCTOR. ° °1. A prescription for pain medication may be given to you upon discharge.  Take your pain medication as prescribed, if needed.  If narcotic pain medicine is not needed, then you may take acetaminophen (Tylenol) or ibuprofen (Advil) as needed. °2. Take your usually prescribed medications unless otherwise directed °3. If you need a refill on your pain medication, please contact your pharmacy.  They will contact our office to request authorization.  Prescriptions will not be filled after 5pm or on week-ends. °4. You should eat very light the first 24 hours after surgery, such as soup, crackers, pudding, etc.  Resume your normal diet the day after surgery °5. It is common to experience some constipation if taking pain medication after surgery.  Increasing fluid intake and taking a stool softener will usually help or prevent this problem from occurring.  A mild laxative (Milk of Magnesia or Miralax) should be taken according to package directions if there are no bowel movements after 48 hours. °6. You may shower in 48 hours.  The surgical glue will flake off in 2-3 weeks.   °7. ACTIVITIES:  No strenuous activity or heavy lifting for 1 week.   °a. You may drive when you no longer are taking prescription pain medication, you can comfortably wear a seatbelt, and you can safely maneuver your car and apply brakes. °b. RETURN TO WORK:  __________1-2 weeks _______________ °You should see your doctor in the office for a follow-up appointment approximately three-four weeks after your surgery.   ° °WHEN TO CALL YOUR DOCTOR: °1. Fever over 101.0 °2. Nausea and/or vomiting. °3. Extreme swelling or  bruising. °4. Continued bleeding from incision. °5. Increased pain, redness, or drainage from the incision. ° °The clinic staff is available to answer your questions during regular business hours.  Please don’t hesitate to call and ask to speak to one of the nurses for clinical concerns.  If you have a medical emergency, go to the nearest emergency room or call 911.  A surgeon from [facility] is always on call at the hospital. ° °For further questions, please visit [email or web address]  ° °

## 2013-10-22 NOTE — Anesthesia Preprocedure Evaluation (Signed)
Anesthesia Evaluation  Patient identified by MRN, date of birth, ID band Patient awake    Reviewed: Allergy & Precautions, H&P , NPO status , Patient's Chart, lab work & pertinent test results  Airway Mallampati: I TM Distance: >3 FB Neck ROM: Full    Dental  (+) Teeth Intact, Dental Advisory Given   Pulmonary former smoker,  breath sounds clear to auscultation        Cardiovascular hypertension, Pt. on medications Rhythm:Regular Rate:Normal     Neuro/Psych    GI/Hepatic   Endo/Other  diabetes, Well Controlled, Type 2, Oral Hypoglycemic Agents  Renal/GU      Musculoskeletal   Abdominal   Peds  Hematology   Anesthesia Other Findings   Reproductive/Obstetrics                           Anesthesia Physical Anesthesia Plan  ASA: II  Anesthesia Plan: General   Post-op Pain Management:    Induction: Intravenous  Airway Management Planned: LMA  Additional Equipment:   Intra-op Plan:   Post-operative Plan: Extubation in OR  Informed Consent: I have reviewed the patients History and Physical, chart, labs and discussed the procedure including the risks, benefits and alternatives for the proposed anesthesia with the patient or authorized representative who has indicated his/her understanding and acceptance.   Dental advisory given  Plan Discussed with: CRNA, Anesthesiologist and Surgeon  Anesthesia Plan Comments:         Anesthesia Quick Evaluation  

## 2013-10-22 NOTE — Transfer of Care (Signed)
Immediate Anesthesia Transfer of Care Note  Patient: Shelly Hall  Procedure(s) Performed: Procedure(s): INSERTION PORT-A-CATH (N/A)  Patient Location: PACU  Anesthesia Type:General  Level of Consciousness: awake and alert   Airway & Oxygen Therapy: Patient Spontanous Breathing  Post-op Assessment: Report given to PACU RN and Post -op Vital signs reviewed and stable  Post vital signs: Reviewed and stable  Complications: No apparent anesthesia complications

## 2013-10-22 NOTE — Progress Notes (Signed)
Patient complains of itching to chest area after CHG cleansing. Washed area with warm soapy water.

## 2013-10-22 NOTE — Anesthesia Postprocedure Evaluation (Signed)
  Anesthesia Post-op Note  Patient: Shelly Hall  Procedure(s) Performed: Procedure(s): INSERTION PORT-A-CATH (N/A)  Patient Location: PACU  Anesthesia Type:General  Level of Consciousness: awake and alert   Airway and Oxygen Therapy: Patient Spontanous Breathing  Post-op Pain: none  Post-op Assessment: Post-op Vital signs reviewed, Patient's Cardiovascular Status Stable and Respiratory Function Stable  Post-op Vital Signs: Reviewed  Filed Vitals:   10/22/13 1214  BP:   Pulse:   Temp: 36.8 C  Resp:     Complications: No apparent anesthesia complications

## 2013-10-22 NOTE — Anesthesia Procedure Notes (Signed)
Procedure Name: LMA Insertion Date/Time: 10/22/2013 9:05 AM Performed by: Eligha Bridegroom Pre-anesthesia Checklist: Patient identified, Patient being monitored, Emergency Drugs available, Timeout performed and Suction available Patient Re-evaluated:Patient Re-evaluated prior to inductionOxygen Delivery Method: Circle system utilized Intubation Type: IV induction LMA: LMA inserted Number of attempts: 1 Placement Confirmation: positive ETCO2 and breath sounds checked- equal and bilateral Dental Injury: Teeth and Oropharynx as per pre-operative assessment

## 2013-10-22 NOTE — Interval H&P Note (Signed)
History and Physical Interval Note:  10/22/2013 8:48 AM  Shelly Hall  has presented today for surgery, with the diagnosis of left breast cancer   The various methods of treatment have been discussed with the patient and family. After consideration of risks, benefits and other options for treatment, the patient has consented to  Procedure(s): INSERTION PORT-A-CATH (N/A) as a surgical intervention .  The patient's history has been reviewed, patient examined, no change in status, stable for surgery.  I have reviewed the patient's chart and labs.  Questions were answered to the patient's satisfaction.     [name]

## 2013-10-22 NOTE — Op Note (Signed)
PREOPERATIVE DIAGNOSIS:  Left breast cancer     POSTOPERATIVE DIAGNOSIS:  Same     PROCEDURE: Left subclavian port placement, [facility] ClearVue  Power Port, MRI safe, 8-French.      SURGEON:  [name], MD      ANESTHESIA:  General   FINDINGS:  Good venous return, easy flush, and tip of the catheter and   SVC 23 cm.      SPECIMEN:  None.      ESTIMATED BLOOD LOSS:  Minimal.      COMPLICATIONS:  None known.      PROCEDURE:  Pt was identified in the holding area and taken to   the operating room, where patient was placed supine on the operating room   table.  General anesthesia was induced.  Patient's arms were tucked and the upper   chest and neck were prepped and draped in sterile fashion.  Time-out was   performed according to the surgical safety check list.  When all was   correct, we continued.   Local anesthetic was administered over this   area at the angle of the clavicle.  The vein was accessed with 1 pass of the needle. There was good venous return and the wire passed easily with no ectopy.   Fluoroscopy was used to confirm that the wire was in the vena cava.      The patient was placed back level and the area for the pocket was anethetized   with local anesthetic.  A 3-cm transverse incision was made with a #15   blade.  Cautery was used to divide the subcutaneous tissues down to the   pectoralis muscle.  An [employer] retractor was used to elevate the skin   while a pocket was created on top of the pectoralis fascia.  The port   was placed into the pocket to confirm that it was of adequate size.  The   catheter was preattached to the port.  The port was then secured to the   pectoralis fascia with four 2-0 Prolene sutures.  These were clamped and   not tied down yet.    The catheter was tunneled through to the wire exit   site.  The catheter was placed along the wire to determine what length it should be to be in the SVC.  The catheter was cut at 23 cm.  The tunneler  sheath and dilator were passed over the wire and the dilator and wire were removed.  The catheter was advanced through the tunneler sheath and the tunneler sheath was pulled away.  Care was taken to keep the catheter in the tunneler sheath as this occurred. This was advanced and the tunneler sheath was removed.  There was good venous   return and easy flush of the catheter.  The Prolene sutures were tied   down to the pectoral fascia.  The skin was reapproximated using 3-0   Vicryl interrupted deep dermal sutures.    Fluoroscopy was used to re-confirm good position of the catheter.  The skin   was then closed using 4-0 Monocryl in a subcuticular fashion.  The port was flushed with concentrated heparin flush as well.  The wounds were then cleaned, dried, and dressed with Dermabond.  The patient was awakened from anesthesia and taken to the PACU in stable condition.  Needle, sponge, and instrument counts were correct.               [name], MD

## 2013-10-22 NOTE — H&P (View-Only) (Signed)
Chief complaint:  New left breast cancer  HISTORY: Patient is a 43 year old female who is referred by Dr. Radford Pax and Dr. Benjie Hall for consultation regarding new left breast cancer.  She presented with a palpable lump in the left breast and left axilla. She had been called back from screening, but after she had her screening mammograms, she noted these masses. On her mammograms, she had multiple masses that were distant from each other. 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 Ki-67 was 20%.  MRI was performed and demonstrates a 12.7 cm area of enhancement with multiple enlarged level I and level II lymph nodes.   She is very anxious because she has a new job doing custodial type work. She has no family history of cancer that she is aware of. She had menarche at age 41. She is still having periods. She has used hormonal contraception. She has had her first child at age 75. She has had a bone density study. She is up-to-date with her pap smear.  She drinks about one alcoholic beverage per week and smokes a pack a day.  Past Medical History  Diagnosis Date  . Diabetes mellitus   . Hypertension   . Sarcoidosis   . Anxiety   . Depression   . Anemia     Past Surgical History  Procedure Laterality Date  . Uterine fibroid surgery    . Tubal ligation  2001  . Endobronchial ultrasound Bilateral 12/02/2012    Procedure: ENDOBRONCHIAL ULTRASOUND;  Surgeon: Collene Gobble, MD;  Location: [facility];  Service: Cardiopulmonary;  Laterality: Bilateral;    Current Outpatient Prescriptions  Medication Sig Dispense Refill  . atenolol-chlorthalidone (TENORETIC) 50-25 MG per tablet Take 1 tablet by mouth daily.  30 tablet  1  . HYDROcodone-acetaminophen (NORCO/VICODIN) 5-325 MG per tablet Take 1 tablet by mouth every 4 (four) hours as needed for moderate pain.  20 tablet  0  .  metFORMIN (GLUCOPHAGE) 500 MG tablet Take 1 tablet (500 mg total) by mouth 2 (two) times daily with a meal.  60 tablet  1  . metroNIDAZOLE (FLAGYL) 500 MG tablet       . norethindrone (AYGESTIN) 5 MG tablet        No current facility-administered medications for this visit.     No Known Allergies   Family History  Problem Relation Age of Onset  . Asthma Mother      History   Social History  . Marital Status: Single    Spouse Name: N/A    Number of Children: 2  . Years of Education: N/A   Occupational History  . [employer]    Social History Main Topics  . Smoking status: Current Every Day Smoker -- 1.00 packs/day for 23 years    Types: Cigarettes  . Smokeless tobacco: Never Used  . Alcohol Use: 1.0 oz/week    2 drink(s) per week     Comment: occ. x2 dks per week  . Drug Use: No  . Sexual Activity: Not Currently   Other Topics Concern  . Not on file   Social History Narrative  . No narrative on file     REVIEW OF SYSTEMS - PERTINENT POSITIVES ONLY: 12 point review of systems negative other than HPI and PMH except for fatigue, insomnia, left breast pain, shortness of breath in walking, abdominal pain, change  in stool habits, headaches, anxiety and depression, diabetes, and anemia secondary to fibroids.  EXAM: There were no vitals filed for this visit.  Wt Readings from Last 3 Encounters:  10/15/13 180 lb 9.6 oz (81.92 kg)  08/04/13 180 lb (81.647 kg)  11/27/12 191 lb (86.637 kg)     Gen:  No acute distress.  Well nourished and well groomed.   Neurological: Alert and oriented to person, place, and time. Coordination normal.  Head: Normocephalic and atraumatic.  Eyes: Conjunctivae are normal. Pupils are equal, round, and reactive to light. No scleral icterus.  Neck: Normal range of motion. Neck supple. No tracheal deviation or thyromegaly present.  Cardiovascular: Normal rate, regular rhythm, normal heart sounds and intact distal pulses.  Exam reveals no  gallop and no friction rub.  No murmur heard. Breast: palpable mass in upper outer quadrant. 3 cm.  Additional mass at 1 o'clock behind nipple.  This one was around 2 cm.  There is a large lymph node palpable in the left axilla.  No palpable masses on the right.  No nipple retraction. No skin dimpling.   Respiratory: Effort normal.  No respiratory distress. No chest wall tenderness. Breath sounds normal.  No wheezes, rales or rhonchi.  GI: Soft. Bowel sounds are normal. The abdomen is soft and nontender.  There is no rebound and no guarding.  Musculoskeletal: Normal range of motion. Extremities are nontender.  Lymphadenopathy: No cervical, preauricular, or postauricular adenopathy is present Skin: Skin is warm and dry. No rash noted. No diaphoresis. No erythema. No pallor. No clubbing, cyanosis, or edema.   Psychiatric: Normal mood and affect. Behavior is normal. Judgment and thought content normal.    LABORATORY RESULTS: Available labs are reviewed   Recent Results (from the past 2160 hour(s))  COMPREHENSIVE METABOLIC PANEL ([facility])     Status: Abnormal   Collection Time    10/15/13  8:21 AM      Result Value Ref Range   Sodium 141  136 - 145 mEq/L   Potassium 3.4 (*) 3.5 - 5.1 mEq/L   Chloride 110 (*) 98 - 109 mEq/L   CO2 19 (*) 22 - 29 mEq/L   Glucose 103  70 - 140 mg/dl   BUN 11.0  7.0 - 26.0 mg/dL   Creatinine 0.8  0.6 - 1.1 mg/dL   Total Bilirubin 0.34  0.20 - 1.20 mg/dL   Alkaline Phosphatase 56  40 - 150 U/L   AST 19  5 - 34 U/L   ALT 20  0 - 55 U/L   Total Protein 7.4  6.4 - 8.3 g/dL   Albumin 3.8  3.5 - 5.0 g/dL   Calcium 9.5  8.4 - 10.4 mg/dL   Anion Gap 12 (*) 3 - 11 mEq/L  CBC WITH DIFFERENTIAL     Status: Abnormal   Collection Time    10/15/13  8:21 AM      Result Value Ref Range   WBC 4.9  3.9 - 10.3 10e3/uL   NEUT# 3.0  1.5 - 6.5 10e3/uL   HGB 9.5 (*) 11.6 - 15.9 g/dL   HCT 30.3 (*) 34.8 - 46.6 %   Platelets 240  145 - 400 10e3/uL   MCV 77.5 (*) 79.5 - 101.0 fL    MCH 24.2 (*) 25.1 - 34.0 pg   MCHC 31.3 (*) 31.5 - 36.0 g/dL   RBC 3.91  3.70 - 5.45 10e6/uL   RDW 31.1 (*) 11.2 - 14.5 %   lymph# 1.2  0.9 - 3.3 10e3/uL   MONO# 0.4  0.1 - 0.9 10e3/uL   Eosinophils Absolute 0.3  0.0 - 0.5 10e3/uL   Basophils Absolute 0.0  0.0 - 0.1 10e3/uL   NEUT% 60.8  38.4 - 76.8 %   LYMPH% 24.6  14.0 - 49.7 %   MONO% 8.8  0.0 - 14.0 %   EOS% 5.2  0.0 - 7.0 %   BASO% 0.6  0.0 - 2.0 %     RADIOLOGY RESULTS: See E-Chart or I-Site for most recent results.  Images and reports are reviewed.  Mr Breast Bilateral W Wo Contrast  10/10/2013   CLINICAL DATA:  Recently diagnosed left breast cancer. Ultrasound-guided core biopsy of 3 o'clock location 3 cm from the nipple shows invasive ductal carcinoma and ductal carcinoma in situ. Biopsy of the 230 o'clock location 10 cm from the nipple shows invasive ductal carcinoma and ductal carcinoma in situ. Biopsy of left axillary lymph node shows metastatic disease.  LABS:  Not applicable  EXAM: BILATERAL BREAST MRI WITH AND WITHOUT CONTRAST  TECHNIQUE: Multiplanar, multisequence MR images of both breasts were obtained prior to and following the intravenous administration of ml of MultiHance.  THREE-DIMENSIONAL MR IMAGE RENDERING ON INDEPENDENT WORKSTATION:  Three-dimensional MR images were rendered by post-processing of the original MR data on an independent workstation. The three-dimensional MR images were interpreted, and findings are reported in the following complete MRI report for this study. Three dimensional images were evaluated at the independent DynaCad workstation  COMPARISON:  Previous exams  FINDINGS: Breast composition: b.  Scattered fibroglandular tissue.  Background parenchymal enhancement: Mild  Right breast: No mass or abnormal enhancement.  Left breast: There are numerous enhancing masses of bearing sizes within the lateral aspect of the left breast. The largest is 2.4 x 2.8 x 3.5 cm and demonstrates washout type  enhancement. Within this mass there is associated with clip artifact following recent biopsy. Measured together, the enhancing region is 12.7 x 4.5 x 3.8 cm.  Lymph nodes: Numerous enlarged left axillary lymph nodes are identified. At least 5 enlarged nodes are seen, involving both level 1 and level 2 nodal stations. The largest is in the level 1 lower axilla measuring 2.3 cm. Post biopsy changes are identified in this region. The right axilla and internal mammary regions are unremarkable.  Ancillary findings:  None.  IMPRESSION: 1. MRI evidence for significant left breast malignancy, measuring at least 12.7 cm in anterior-posterior extent. 2. Enlarged left level 1 and level 2 axillary lymph nodes. 3. Right breast is negative.  RECOMMENDATION: Treatment plan.  BI-RADS CATEGORY  6: Known biopsy-proven malignancy.   Electronically Signed   By: Shon Hale M.D.   On: 10/10/2013 11:45   Mm Digital Diagnostic Unilat L  10/03/2013   CLINICAL DATA:  Biopsies of 2 masses in the left breast for performed today, as well as biopsy of the suspicious left axillary lymph node.  EXAM: POST-BIOPSY CLIP PLACEMENT LEFT DIAGNOSTIC MAMMOGRAM  COMPARISON:  Previous exams.  FINDINGS: Films are performed following ultrasound guided biopsy of a mass at 3 o'clock position 3 cm from the nipple and a mass at 2:30 position 10 cm from the nipple. A coil shaped biopsy clip is satisfactorily positioned within the smaller mass at the 3 o'clock position 3 cm from the nipple. A ribbon shaped biopsy clip is satisfactorily positioned within the larger mass in the 2:30 position of the left breast 10 cm from the nipple. Please note that there also suspicious microcalcifications  in the left breast between the two biopsied masses, all within a segmental distribution.  IMPRESSION: Satisfactory position of 2 biopsy clips in the left breast as described above.  Final Assessment: Post Procedure Mammograms for Marker Placement   Electronically Signed   By:  Curlene Dolphin M.D.   On: 10/03/2013 16:38   Mm Digital Diagnostic Unilat L  09/30/2013   CLINICAL DATA:  43 year old patient with possible distortion and calcifications identified in the left breast on recent screening mammogram/ tomogram. The patient reports that she has felt a palpable lump in the 3 o'clock region of the left breast and has more recently noticed a palpable lump within the left axilla that is tender.  EXAM: DIGITAL DIAGNOSTIC  LEFT MAMMOGRAM WITH CAD  ULTRASOUND LEFT BREAST  COMPARISON:  Screening mammogram/tomogram 09/19/2013.  ACR Breast Density Category b: There are scattered areas of fibroglandular density.  FINDINGS: Focal spot compression view of the outer left breast shows an irregular mass with associated distortion. There are several microcalcifications within this irregular mass. Approximately 4 cm anterior to the dominant mass in the outer left breast is a cluster of pleomorphic calcifications that span approximately 7 mm. Further anterior in the outer left breast, is an irregular mass with two associated calcifications. There is a probable area of spiculation in the outer left breast approximately halfway between the dominant mass and the smaller mass. In total, the abnormalities in the left breast on mammography span approximately 10 cm and are in a segmental distribution.  It is noted that there is suspicion for left axillary lymphadenopathy based on the screening mammogram/tomogram performed 09/19/2013.  Mammographic images were processed with CAD.  On physical exam, I palpate a firm mass in the 2:30 o'clock position of the left breast centered approximately 10 cm from the nipple. I palpate discrete lymphadenopathy in the inferior left axilla.  Ultrasound is performed, showing an irregular hypoechoic mass containing internal calcifications centered at 2:30 position 10 cm from the nipple that measures approximately 4.6 x 1.9 x 1.0 cm. There is internal vascular flow. At 3 o'clock  position 7 cm from the nipple is an oval slightly irregular mass with internal calcifications and internal vascularity that measures 1.4 x 0.8 x 0.8 cm. At 3 o'clock position approximately 3 cm from the nipple is an irregular hypoechoic mass with internal echogenicity likely reflecting calcification. This mass measures 5 x 7 x 9 mm and demonstrates prominent vascular flow. This is felt to correspond to the smaller irregular mass in the outer left breast with two associated microcalcifications on the mammogram.  Ultrasound of the left axilla the demonstrates a dominant 3.6 x 1.6 x 2.7 cm lymph node with a hypoechoic and bulky, thickened cortex. There is peripheral vascular flow that extends into the abnormal cortex.  IMPRESSION: Findings highly suspicious for and multifocal malignancy in a segmental distribution of the outer left breast and metastatic left axillary lymphadenopathy.  RECOMMENDATION: Ultrasound-guided core needle biopsies of the dominant palpable mass in the 2:30 position of the left breast 10 cm from the nipple, the small irregular mass at 3 o'clock position 3 cm from the nipple, and of the palpable lymphadenopathy are recommended. Biopsies have been scheduled for Friday, May 1st, at 2:30 p.m.  I have discussed the findings and recommendations with the patient. Results were also provided in writing at the conclusion of the visit. If applicable, a reminder letter will be sent to the patient regarding the next appointment.  BI-RADS CATEGORY  5: Highly suggestive  of malignancy.   Electronically Signed   By: Curlene Dolphin M.D.   On: 09/30/2013 16:21   US Breast Ltd Uni Left Inc Axilla  09/30/2013   CLINICAL DATA:  43 year old patient with possible distortion and calcifications identified in the left breast on recent screening mammogram/ tomogram. The patient reports that she has felt a palpable lump in the 3 o'clock region of the left breast and has more recently noticed a palpable lump within the left  axilla that is tender.  EXAM: DIGITAL DIAGNOSTIC  LEFT MAMMOGRAM WITH CAD  ULTRASOUND LEFT BREAST  COMPARISON:  Screening mammogram/tomogram 09/19/2013.  ACR Breast Density Category b: There are scattered areas of fibroglandular density.  FINDINGS: Focal spot compression view of the outer left breast shows an irregular mass with associated distortion. There are several microcalcifications within this irregular mass. Approximately 4 cm anterior to the dominant mass in the outer left breast is a cluster of pleomorphic calcifications that span approximately 7 mm. Further anterior in the outer left breast, is an irregular mass with two associated calcifications. There is a probable area of spiculation in the outer left breast approximately halfway between the dominant mass and the smaller mass. In total, the abnormalities in the left breast on mammography span approximately 10 cm and are in a segmental distribution.  It is noted that there is suspicion for left axillary lymphadenopathy based on the screening mammogram/tomogram performed 09/19/2013.  Mammographic images were processed with CAD.  On physical exam, I palpate a firm mass in the 2:30 o'clock position of the left breast centered approximately 10 cm from the nipple. I palpate discrete lymphadenopathy in the inferior left axilla.  Ultrasound is performed, showing an irregular hypoechoic mass containing internal calcifications centered at 2:30 position 10 cm from the nipple that measures approximately 4.6 x 1.9 x 1.0 cm. There is internal vascular flow. At 3 o'clock position 7 cm from the nipple is an oval slightly irregular mass with internal calcifications and internal vascularity that measures 1.4 x 0.8 x 0.8 cm. At 3 o'clock position approximately 3 cm from the nipple is an irregular hypoechoic mass with internal echogenicity likely reflecting calcification. This mass measures 5 x 7 x 9 mm and demonstrates prominent vascular flow. This is felt to correspond  to the smaller irregular mass in the outer left breast with two associated microcalcifications on the mammogram.  Ultrasound of the left axilla the demonstrates a dominant 3.6 x 1.6 x 2.7 cm lymph node with a hypoechoic and bulky, thickened cortex. There is peripheral vascular flow that extends into the abnormal cortex.  IMPRESSION: Findings highly suspicious for and multifocal malignancy in a segmental distribution of the outer left breast and metastatic left axillary lymphadenopathy.  RECOMMENDATION: Ultrasound-guided core needle biopsies of the dominant palpable mass in the 2:30 position of the left breast 10 cm from the nipple, the small irregular mass at 3 o'clock position 3 cm from the nipple, and of the palpable lymphadenopathy are recommended. Biopsies have been scheduled for Friday, May 1st, at 2:30 p.m.  I have discussed the findings and recommendations with the patient. Results were also provided in writing at the conclusion of the visit. If applicable, a reminder letter will be sent to the patient regarding the next appointment.  BI-RADS CATEGORY  5: Highly suggestive of malignancy.   Electronically Signed   By: Curlene Dolphin M.D.   On: 09/30/2013 16:21   Mm Screening Breast Tomo Bilateral  09/22/2013   CLINICAL DATA:  Screening.  EXAM: DIGITAL SCREENING BILATERAL MAMMOGRAM WITH 3D TOMO WITH CAD  COMPARISON:  Previous Exam(s).  ACR Breast Density Category b: There are scattered areas of fibroglandular density.  FINDINGS: In the left breast, possible distortion warrants further evaluation with spot compression views and possibly ultrasound. A nearby group of calcifications warrant further evaluation with spot magnification views. In the right breast, no findings suspicious for malignancy. Images were processed with CAD.  IMPRESSION: Further evaluation is suggested for possible distortion and calcifications in the left breast.  RECOMMENDATION: Diagnostic mammogram and possibly ultrasound of the left  breast. (Code:FI-L-46M)  The patient will be contacted regarding the findings, and additional imaging will be scheduled.  BI-RADS CATEGORY  0: Incomplete. Need additional imaging evaluation and/or prior mammograms for comparison.   Electronically Signed   By: Evangeline Dakin M.D.   On: 09/22/2013 13:14   [place] Lt Breast Bx W Loc Dev 1st Lesion Img Bx Spec US Guide  10/06/2013   ADDENDUM REPORT: 10/06/2013 15:30  ADDENDUM: Pathology results of 2 left breast biopsies and a left axillary lymph node biopsy are received.  Pathology results of the left breast biopsy at 3 o'clock position 3 cm from the nipple show invasive ductal carcinoma and DCIS.  Pathology results of the mass at 2:30 position left breast 10 cm from the nipple show invasive ductal carcinoma and DCIS.  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  Pathology results for all 3 biopsies are concordant.  The patient reports doing well after the biopsies.  The patient has been scheduled for bilateral breast MRI on 10/10/2013 at 9:30 a.m. she is scheduled for the Multidisciplinary Breast Cancer Clinic at [facility] on 10/15/2013. The patient was given both of these dates. I have made her aware that there are free information materials available at the [place] about Breast Cancer that she can pick up.   Electronically Signed   By: Curlene Dolphin M.D.   On: 10/06/2013 15:30   10/06/2013   CLINICAL DATA:  Suspicious masses in the outer left breast and suspicious left axillary lymph node. A total of 3 biopsies were performed today. This biopsy describes the mass at 3 o'clock position approximately 3 cm from nipple that measures 7 x 5 x 9 mm.  EXAM: ULTRASOUND GUIDED LEFT BREAST CORE NEEDLE BIOPSY  COMPARISON:  Previous exams.  PROCEDURE: I met with the patient and we discussed the procedure of ultrasound-guided biopsy, including benefits and alternatives. We discussed the high likelihood of a  successful procedure. We discussed the risks of the procedure including infection, bleeding, tissue injury, clip migration, and inadequate sampling. Informed written consent was given. The usual time-out protocol was performed immediately prior to the procedure.  Using sterile technique and 2% Lidocaine as local anesthetic, under direct ultrasound visualization, a 12 gauge vacuum-assisteddevice was used to perform biopsy of a 9 mm mass at 3 o'clock position 3 cm from the nipple using a lateral to medial approach. At the conclusion of the procedure, a coil shaped tissue marker clip was deployed into the biopsy cavity. Follow-up 2-view mammogram was performed and dictated separately.  IMPRESSION: Ultrasound-guided biopsy of left breast mass at 3 o'clock position 3 cm from the nipple. No apparent complications.  Electronically Signed: By: Curlene Dolphin M.D. On: 10/03/2013 16:26   [place] Lt Breast Bx W Loc Dev Ea Add Lesion Img Bx Spec US Guide  10/06/2013   ADDENDUM REPORT: 10/06/2013 15:31  ADDENDUM: Pathology  results of 2 left breast biopsies and a left axillary lymph node biopsy are received.  Pathology results of the left breast biopsy at 3 o'clock position 3 cm from the nipple show invasive ductal carcinoma and DCIS.  Pathology results of the mass at 2:30 position left breast 10 cm from the nipple show invasive ductal carcinoma and DCIS.  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  Pathology results for all 3 biopsies are concordant.  The patient reports doing well after the biopsies.  The patient has been scheduled for bilateral breast MRI on 10/10/2013 at 9:30 a.m. she is scheduled for the Multidisciplinary Breast Cancer Clinic at [facility] on 10/15/2013. The patient was given both of these dates. I have made her aware that there are free information materials available at the [facility] about Breast Cancer that she can pick up.   Electronically  Signed   By: Curlene Dolphin M.D.   On: 10/06/2013 15:31   10/06/2013   CLINICAL DATA:  Patient presents for biopsy of 2 suspicious left breast masses and is suspicious left axillary lymph node. This biopsied describes a report of the biopsy of the palpable left axillary lymph node.  EXAM: ULTRASOUND GUIDED CORE NEEDLE BIOPSY OF A LEFT AXILLARY NODE  COMPARISON:  Previous exams.  FINDINGS: I met with the patient and we discussed the procedure of ultrasound-guided biopsy, including benefits and alternatives. We discussed the high likelihood of a successful procedure. We discussed the risks of the procedure, including infection, bleeding, tissue injury, clip migration, and inadequate sampling. Informed written consent was given. The usual time-out protocol was performed immediately prior to the procedure.  Using sterile technique and 2% Lidocaine as local anesthetic, under direct ultrasound visualization, a 9 gauge vacuum assisted device was used to perform biopsy of an enlarged lymph node with a thickened cortex using a lateral to medial approach.  IMPRESSION: Ultrasound guided biopsy of suspicious left axillary lymph node. No apparent complications.  Electronically Signed: By: Curlene Dolphin M.D. On: 10/03/2013 16:28   [place] Lt Breast Bx W Loc Dev Ea Add Lesion Img Bx Spec US Guide  10/06/2013   ADDENDUM REPORT: 10/06/2013 15:30  ADDENDUM: Pathology results of 2 left breast biopsies and a left axillary lymph node biopsy are received.  Pathology results of the left breast biopsy at 3 o'clock position 3 cm from the nipple show invasive ductal carcinoma and DCIS.  Pathology results of the mass at 2:30 position left breast 10 cm from the nipple show invasive ductal carcinoma and DCIS.  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  Pathology results for all 3 biopsies are concordant.  The patient reports doing well after the biopsies.  The patient has been scheduled for  bilateral breast MRI on 10/10/2013 at 9:30 a.m. she is scheduled for the Multidisciplinary Breast Cancer Clinic at [facility] on 10/15/2013. The patient was given both of these dates. I have made her aware that there are free information materials available at the [facility] about Breast Cancer that she can pick up.   Electronically Signed   By: Curlene Dolphin M.D.   On: 10/06/2013 15:30   10/06/2013   CLINICAL DATA:  Patient presents for total of 3 biopsies today. This report describes biopsy of the palpable left breast mass centered at 2:30 position 10 cm from the nipple.  EXAM: ULTRASOUND GUIDED LEFT BREAST CORE NEEDLE  BIOPSY  COMPARISON:  Previous exams.  PROCEDURE: I met with the patient and we discussed the procedure of ultrasound-guided biopsy, including benefits and alternatives. We discussed the high likelihood of a successful procedure. We discussed the risks of the procedure including infection, bleeding, tissue injury, clip migration, and inadequate sampling. Informed written consent was given. The usual time-out protocol was performed immediately prior to the procedure.  Using sterile technique and 2% Lidocaine as local anesthetic, under direct ultrasound visualization, a 12 gauge vacuum-assisteddevice was used to perform biopsy of a palpable mass at 2:30 position 10 cm from the nipple using a lateral to medial approach. At the conclusion of the procedure, a ribbon shaped tissue marker clip was deployed into the biopsy cavity. Follow-up 2-view mammogram was performed and dictated separately.  IMPRESSION: Ultrasound-guided biopsy of left breast mass 2:30 position 10 cm from the nipple. No apparent complications.  Electronically Signed: By: Curlene Dolphin M.D. On: 10/03/2013 16:27      ASSESSMENT AND PLAN: Breast cancer of upper-outer quadrant of left female breast Patient appears to have a T3 N1 new left breast cancer.  She will undergo staging studies. Assuming this is negative, she  will discuss with Dr. Jana Hakim whether she should undergo neoadjuvant chemotherapy to see if she has an improvement in her nodal disease. If she had a complete pathologic response in the axilla, she would be eligible for the [facility] trial to evaluate her for a sentinel lymph node biopsy after chemotherapy. If she does not desire to do this, she would need a left-sided mastectomy with axillary lymph node dissection and Port-A-Cath placement.    She is not a candidate for immediate reconstruction because of the size of her mass and nodal status. She will need radiation postoperatively no matter what. She was also advised to stop smoking.    She is highly motivated as she is having her first grandchild in September.    I discussed the risks of Port-A-Cath placement. I showed the patient educational material regarding the port.  She was advised about the risk of pneumothorax, malposition, malfunction, blood clot.    45 min spent in evaluation, examination, counseling, and coordination of care.    >50% spent in counseling.          Shelly Height MD Surgical Oncology, General and [facility]      Visit Diagnoses: 1. Breast cancer of upper-outer quadrant of left female breast     Primary Care Physician: No PCP Per Patient  Shelly Hall, Shelly Hall, Shelly Hall

## 2013-10-23 ENCOUNTER — Encounter: Payer: Self-pay | Admitting: *Deleted

## 2013-10-23 ENCOUNTER — Other Ambulatory Visit: Payer: Self-pay | Admitting: *Deleted

## 2013-10-23 DIAGNOSIS — C50412 Malignant neoplasm of upper-outer quadrant of left female breast: Secondary | ICD-10-CM

## 2013-10-24 ENCOUNTER — Encounter (HOSPITAL_COMMUNITY): Payer: Self-pay | Admitting: General Surgery

## 2013-10-24 ENCOUNTER — Ambulatory Visit (HOSPITAL_BASED_OUTPATIENT_CLINIC_OR_DEPARTMENT_OTHER): Payer: No Typology Code available for payment source | Admitting: Oncology

## 2013-10-24 ENCOUNTER — Telehealth: Payer: Self-pay | Admitting: Oncology

## 2013-10-24 ENCOUNTER — Other Ambulatory Visit (HOSPITAL_BASED_OUTPATIENT_CLINIC_OR_DEPARTMENT_OTHER): Payer: No Typology Code available for payment source

## 2013-10-24 VITALS — BP 117/82 | HR 73 | Temp 98.9°F | Resp 18 | Ht 64.0 in | Wt 176.6 lb

## 2013-10-24 DIAGNOSIS — C50919 Malignant neoplasm of unspecified site of unspecified female breast: Secondary | ICD-10-CM

## 2013-10-24 DIAGNOSIS — C50412 Malignant neoplasm of upper-outer quadrant of left female breast: Secondary | ICD-10-CM

## 2013-10-24 DIAGNOSIS — C773 Secondary and unspecified malignant neoplasm of axilla and upper limb lymph nodes: Secondary | ICD-10-CM

## 2013-10-24 DIAGNOSIS — C50419 Malignant neoplasm of upper-outer quadrant of unspecified female breast: Secondary | ICD-10-CM

## 2013-10-24 DIAGNOSIS — Z170 Estrogen receptor positive status [ER+]: Secondary | ICD-10-CM

## 2013-10-24 DIAGNOSIS — D869 Sarcoidosis, unspecified: Secondary | ICD-10-CM

## 2013-10-24 DIAGNOSIS — E119 Type 2 diabetes mellitus without complications: Secondary | ICD-10-CM

## 2013-10-24 DIAGNOSIS — F17200 Nicotine dependence, unspecified, uncomplicated: Secondary | ICD-10-CM

## 2013-10-24 LAB — CBC WITH DIFFERENTIAL/PLATELET
BASO%: 0.6 % (ref 0.0–2.0)
Basophils Absolute: 0.1 10*3/uL (ref 0.0–0.1)
EOS ABS: 0.3 10*3/uL (ref 0.0–0.5)
EOS%: 2.7 % (ref 0.0–7.0)
HEMATOCRIT: 35.7 % (ref 34.8–46.6)
HGB: 11.3 g/dL — ABNORMAL LOW (ref 11.6–15.9)
LYMPH%: 10.6 % — ABNORMAL LOW (ref 14.0–49.7)
MCH: 25.4 pg (ref 25.1–34.0)
MCHC: 31.7 g/dL (ref 31.5–36.0)
MCV: 80.2 fL (ref 79.5–101.0)
MONO#: 0.6 10*3/uL (ref 0.1–0.9)
MONO%: 6.8 % (ref 0.0–14.0)
NEUT%: 79.3 % — AB (ref 38.4–76.8)
NEUTROS ABS: 7.6 10*3/uL — AB (ref 1.5–6.5)
PLATELETS: 246 10*3/uL (ref 145–400)
RBC: 4.45 10*6/uL (ref 3.70–5.45)
RDW: 29.6 % — ABNORMAL HIGH (ref 11.2–14.5)
WBC: 9.5 10*3/uL (ref 3.9–10.3)
lymph#: 1 10*3/uL (ref 0.9–3.3)

## 2013-10-24 MED ORDER — PROCHLORPERAZINE MALEATE 10 MG PO TABS: 10.0000 mg | ORAL_TABLET | Freq: Four times a day (QID) | ORAL | Status: DC | PRN

## 2013-10-24 MED ORDER — DEXAMETHASONE 4 MG PO TABS: ORAL_TABLET | ORAL | Status: DC

## 2013-10-24 MED ORDER — LORAZEPAM 0.5 MG PO TABS: 0.5000 mg | ORAL_TABLET | Freq: Every evening | ORAL | Status: DC | PRN

## 2013-10-24 MED ORDER — LIDOCAINE-PRILOCAINE 2.5-2.5 % EX CREA: 1.0000 "application " | TOPICAL_CREAM | CUTANEOUS | Status: DC | PRN

## 2013-10-24 NOTE — Telephone Encounter (Signed)
, °

## 2013-10-24 NOTE — Progress Notes (Signed)
[facility]  [phone] [phone]) [phone]     ID: Shelly Hall OB: 1971-04-13  MR#: [id number]  FUX#:[id number]  PCP: No PCP Per Patient GYN:  [name] SU: [name] OTHER MD: Thea Silversmith  CHIEF COMPLAINT: Breast cancer/ under active treatment  BREAST CANCER HISTORY: Shelly Hall palpated a mass in her left breast mid-April 2015 and immediately brought it to Dr. Benjie Karvonen' attention. She was set up for bilateral screening mammography with tomography at [facility] 09/19/2013. The possible distortion in the left breast and a nearby group of calcifications was felt to warrant further evaluation, and on 09/30/2013 she underwent unilateral left diagnostic mammography and ultrasonography. There was an irregular mass in the outer left breast associated with microcalcifications. 4 cm anterior to this there was another cluster of pleomorphic calcifications spanning 7 mm. He had more anteriorly there was an irregular mass associated with other calcifications. In total the abnormalities in the left breast spent approximately 10 cm, and is segmental distribution. On exam there was a palpable firm mass at the 2:30 o'clock position of the left breast 10 cm from the nipple. There was palpable adenopathy in the inferior left axilla. Ultrasound confirmed an irregular hypoechoic mass in the left breast measuring 4.6 cm. In addition, a 1.4 cm oval slightly irregular mass was noted at the 3:00 position and yet another mass measured 9 mm in the same area. Ultrasound of the left axilla showed a dominant 3.6 cm lymph node.  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 (Because all 3 biopsies  were morphologically identical, only one prognostic panel was sent).  On may 01/22/2014 the patient underwent bilateral breast MRI. This showed numerous enhancing masses in the left breast, the largest measuring 3.5 cm, and the aggregate measuring 12.7 cm. There were numerous enlarged left axillary lymph nodes, at least 5 of which were enlarged, both at levels 1 and 2. The largest measured 2.3 cm. The right breast was negative.  The patient's subsequent history is as detailed below.   INTERVAL HISTORY: Shelly Hall returns today for followup of her breast cancer accompanied by her mother. Since her last visit here her prognostic panel was completed and showed no evidence of HER-2 amplification, with the signals ratio of less than 2 and the number per cell being less than 4. She also had her port placed 10/22/2013 without event. She has been approved for a grand from [place] (so we will set her prescriptions to the [employer]). She has an echo pending for early next week, but otherwise she is ready to start her chemotherapy.  REVIEW OF SYSTEMS: She has some pain associated with the port, being treated with oxycodone. This is causing her nausea and constipation. We discussed stool softeners and MiraLAX. She is feeling very tired. She is going to be on disability is throughout her treatment. The application is in process. She is having some night sweats, and feels anxious, but not depressed. A detailed review of systems today was otherwise noncontributory  PAST MEDICAL HISTORY: Past Medical History  Diagnosis Date  . Diabetes mellitus   . Hypertension   . Sarcoidosis   . Anxiety   . Depression   . Anemia   . Complication of anesthesia  makes pt. restless and unable to be still  . Cancer     breast cancer    PAST SURGICAL HISTORY: Past Surgical History  Procedure Laterality Date  . Tubal ligation  2001  . Endobronchial ultrasound Bilateral 12/02/2012    Procedure: ENDOBRONCHIAL  ULTRASOUND;  Surgeon: Collene Gobble, MD;  Location: [facility] ENDOSCOPY;  Service: Cardiopulmonary;  Laterality: Bilateral;  . Breast surgery Left     breast biopsy times 3  . Portacath placement N/A 10/22/2013    Procedure: INSERTION PORT-A-CATH;  Surgeon: [name], MD;  Location: [facility] OR;  Service: General;  Laterality: N/A;    FAMILY HISTORY Family History  Problem Relation Age of Onset  . Cancer Paternal Aunt     "bone cancer"; deceased 14s  . Cancer Paternal Uncle     stomach cancer; deceased 93s  . Cancer Paternal Aunt     unk. primary; currently 49s  . Prostate cancer Maternal Grandfather 15  The patient's parents are living, in fair mid to late 21s. The patient had one brother, no sisters. There is no history of breast or ovarian cancer in the family to her knowledge.   GYNECOLOGIC HISTORY:   menarche age 20, first live birth at 78. She is GX P2. She was still having regular periods at the time of the start of her chemotherapy.  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 She is single and lives alone, although her mother is currently staying with her. Her son Shelly Hall lives in [place] and works at [employer]. Son Shelly Hall lives in [place] where he works at a [employer]. The patient has 1 grandchild on the way. She is a Psychologist, forensic.    ADVANCED DIRECTIVES: not in place   HEALTH MAINTENANCE: History  Substance Use Topics  . Smoking status: Former Smoker -- 1.00 packs/day for 23 years    Types: Cigarettes    Quit date: 10/19/2013  . Smokeless tobacco: Never Used  . Alcohol Use: 1.0 oz/week    2 drink(s) per week     Comment: occ. x2 dks per week     Colonoscopy:  PAP:  Bone density: November 2013  Lipid panel:  No Known Allergies  Current Outpatient Prescriptions  Medication Sig Dispense Refill  . albuterol (PROVENTIL HFA;VENTOLIN HFA) 108 (90 BASE) MCG/ACT inhaler Inhale 2 puffs into the lungs every 6 (six) hours as  needed for wheezing or shortness of breath.      Marland Kitchen atenolol-chlorthalidone (TENORETIC) 50-25 MG per tablet Take 1 tablet by mouth daily.  30 tablet  1  . docusate sodium (COLACE) 100 MG capsule Take 100 mg by mouth daily.      . ferrous sulfate 325 (65 FE) MG tablet Take 325 mg by mouth 2 (two) times daily with a meal.      . metFORMIN (GLUCOPHAGE) 500 MG tablet Take 1 tablet (500 mg total) by mouth 2 (two) times daily with a meal.  60 tablet  1  . Multiple Vitamin (MULTIVITAMIN WITH MINERALS) TABS tablet Take 1 tablet by mouth daily.      . norethindrone (AYGESTIN) 5 MG tablet Take 5 mg by mouth daily.       Marland Kitchen oxyCODONE-acetaminophen (ROXICET) 5-325 MG per tablet Take 1-2 tablets by mouth every 4 (four) hours as needed for severe pain.  30 tablet  0   No current facility-administered medications for this visit.    OBJECTIVE: Middle-aged [place] American woman in no acute distress Filed Vitals:  10/24/13 1550  BP: 117/82  Pulse: 73  Temp: 98.9 F (37.2 C)  Resp: 18     Body mass index is 30.3 kg/(m^2).    ECOG FS:1 - Symptomatic but completely ambulatory  Sclerae unicteric, pupils equal, round and reactive Oropharynx clear and moist No cervical or supraclavicular adenopathy Lungs no rales or rhonchi Heart regular rate and rhythm Abd soft, nontender, positive bowel sounds MSK no focal spinal tenderness, no upper extremity lymphedema Neuro: nonfocal, well oriented, appropriate affect Breasts: The right breast is unremarkable. In the left breast there is a mass in the upper outer quadrant which measures approximately 3 cm. It is movable. There are no skin or nipple changes of concern. There is left axillary adenopathy which is palpable and movable and measures approximately 2 cm. imately 2 cm.   LAB RESULTS:  CMP     Component Value Date/Time   NA 140 10/21/2013 1158   NA 141 10/15/2013 0821   K 3.9 10/21/2013 1158   K 3.4* 10/15/2013 0821   CL 105 10/21/2013 1158   CO2 21  10/21/2013 1158   CO2 19* 10/15/2013 0821   GLUCOSE 107* 10/21/2013 1158   GLUCOSE 103 10/15/2013 0821   BUN 16 10/21/2013 1158   BUN 11.0 10/15/2013 0821   CREATININE 0.95 10/21/2013 1158   CREATININE 0.8 10/15/2013 0821   CALCIUM 9.9 10/21/2013 1158   CALCIUM 9.5 10/15/2013 0821   PROT 7.4 10/15/2013 0821   PROT 8.5* 11/27/2012 0900   ALBUMIN 3.8 10/15/2013 0821   ALBUMIN 4.0 11/27/2012 0900   AST 19 10/15/2013 0821   AST 22 11/27/2012 0900   ALT 20 10/15/2013 0821   ALT 17 11/27/2012 0900   ALKPHOS 56 10/15/2013 0821   ALKPHOS 81 11/27/2012 0900   BILITOT 0.34 10/15/2013 0821   BILITOT 0.1* 11/27/2012 0900   GFRNONAA 72* 10/21/2013 1158   GFRAA 83* 10/21/2013 1158    I No results found for this basename: SPEP,  UPEP,   kappa and lambda light chains    Lab Results  Component Value Date   WBC 9.5 10/24/2013   NEUTROABS 7.6* 10/24/2013   HGB 11.3* 10/24/2013   HCT 35.7 10/24/2013   MCV 80.2 10/24/2013   PLT 246 10/24/2013      Chemistry      Component Value Date/Time   NA 140 10/21/2013 1158   NA 141 10/15/2013 0821   K 3.9 10/21/2013 1158   K 3.4* 10/15/2013 0821   CL 105 10/21/2013 1158   CO2 21 10/21/2013 1158   CO2 19* 10/15/2013 0821   BUN 16 10/21/2013 1158   BUN 11.0 10/15/2013 0821   CREATININE 0.95 10/21/2013 1158   CREATININE 0.8 10/15/2013 0821      Component Value Date/Time   CALCIUM 9.9 10/21/2013 1158   CALCIUM 9.5 10/15/2013 0821   ALKPHOS 56 10/15/2013 0821   ALKPHOS 81 11/27/2012 0900   AST 19 10/15/2013 0821   AST 22 11/27/2012 0900   ALT 20 10/15/2013 0821   ALT 17 11/27/2012 0900   BILITOT 0.34 10/15/2013 0821   BILITOT 0.1* 11/27/2012 0900       No results found for this basename: LABCA2    No components found with this basename: LABCA125    No results found for this basename: INR,  in the last 168 hours  Urinalysis    Component Value Date/Time   COLORURINE YELLOW 01/04/2012 0246   APPEARANCEUR CLOUDY* 01/04/2012 0246   LABSPEC 1.030 01/04/2012 0246  PHURINE 6.5  01/04/2012 0246   GLUCOSEU NEGATIVE 01/04/2012 0246   HGBUR LARGE* 01/04/2012 0246   BILIRUBINUR NEGATIVE 01/04/2012 0246   KETONESUR NEGATIVE 01/04/2012 0246   PROTEINUR NEGATIVE 01/04/2012 0246   UROBILINOGEN 0.2 01/04/2012 0246   NITRITE NEGATIVE 01/04/2012 0246   LEUKOCYTESUR NEGATIVE 01/04/2012 0246    STUDIES: Echocardiogram scheduled for 10/28/2013  ASSESSMENT: 43 y.o. [place] woman status post left breast and left axillary lymph node biopsy 10/03/2013, both positive for a clinical T2 N2, stage IIIA invasive ductal carcinoma, grade 2, estrogen and progesterone receptor positive, HER-2 negative, with an MIB-1 of 20%.  (1) neoadjuvant chemotherapy to consist of doxorubicin and cyclophosphamide in dose dense fashion x4, with Neulasta support, to be followed by paclitaxel weekly x12  (2) definitive surgery to follow chemotherapy  (3) radiation to follow surgery  (4) tamoxifen to be started at the end of radiation  (5) continuing tobacco abuse: The patient has been strongly advised to discontinue smoking  (6) genetic testing sent 10/16/2013, results pending  PLAN: Shelly Hall is now ready to start neoadjuvant treatment and today we discussed her antinausea medication and other supportive measures. She will be on promethazine and Decadron as her main antinausea medications, and since she has a history of diabetes it will be important for her to monitor her blood sugar closely for the first 3 days of treatment. If it turns out her sugars do become difficult to manage, as might well be the case, we will discontinue the dexamethasone on days 2 through 4 and rely on Phenergan and lorazepam.  In addition to all her prescriptions, which were placed through the [facility], she received a "map" of how to take her medications and we went over this in detail so she will understand exactly what to do which day. He does have any questions shoulder was a call. All her treatment, lab of followup appointments  have been entered through the first 4 cycles of therapy, after which she will switch to weekly paclitaxel.  Nikcole has a good understanding of the overall plan. She agrees with it. She knows the goal of treatment in her case is cure. She will call with any problems that may develop before her next visit here.   Chauncey Cruel, MD   10/24/2013 4:39 PM

## 2013-10-28 ENCOUNTER — Ambulatory Visit (HOSPITAL_COMMUNITY)
Admission: RE | Admit: 2013-10-28 | Discharge: 2013-10-28 | Disposition: A | Discharge status: Home / Self Care | Payer: No Typology Code available for payment source | Source: Ambulatory Visit | Attending: Oncology | Admitting: Oncology

## 2013-10-28 ENCOUNTER — Other Ambulatory Visit: Payer: Self-pay | Admitting: *Deleted

## 2013-10-28 DIAGNOSIS — C50412 Malignant neoplasm of upper-outer quadrant of left female breast: Secondary | ICD-10-CM

## 2013-10-28 DIAGNOSIS — R9402 Abnormal brain scan: Secondary | ICD-10-CM

## 2013-10-28 NOTE — Progress Notes (Signed)
This RN called pt per MD review of PET with need to follow up - uptake by thyroid with U/S.  Questions answered and order placed.  Pt understands to keep her scheduled appointments on 5/28 with this office.

## 2013-10-29 ENCOUNTER — Ambulatory Visit (HOSPITAL_COMMUNITY)
Admission: RE | Admit: 2013-10-29 | Discharge: 2013-10-29 | Disposition: A | Discharge status: Home / Self Care | Payer: No Typology Code available for payment source | Source: Ambulatory Visit | Attending: Oncology | Admitting: Oncology

## 2013-10-29 ENCOUNTER — Other Ambulatory Visit: Payer: Self-pay | Admitting: *Deleted

## 2013-10-29 DIAGNOSIS — I517 Cardiomegaly: Secondary | ICD-10-CM

## 2013-10-29 DIAGNOSIS — C50412 Malignant neoplasm of upper-outer quadrant of left female breast: Secondary | ICD-10-CM

## 2013-10-29 DIAGNOSIS — D869 Sarcoidosis, unspecified: Secondary | ICD-10-CM | POA: Insufficient documentation

## 2013-10-29 DIAGNOSIS — Z87891 Personal history of nicotine dependence: Secondary | ICD-10-CM | POA: Insufficient documentation

## 2013-10-29 DIAGNOSIS — R599 Enlarged lymph nodes, unspecified: Secondary | ICD-10-CM | POA: Insufficient documentation

## 2013-10-29 DIAGNOSIS — C50919 Malignant neoplasm of unspecified site of unspecified female breast: Secondary | ICD-10-CM | POA: Insufficient documentation

## 2013-10-29 DIAGNOSIS — Z01818 Encounter for other preprocedural examination: Secondary | ICD-10-CM | POA: Insufficient documentation

## 2013-10-29 NOTE — Progress Notes (Signed)
*  PRELIMINARY RESULTS* Echocardiogram 2D Echocardiogram has been performed.  Elvia Collum 10/29/2013, 11:42 AM

## 2013-10-30 ENCOUNTER — Other Ambulatory Visit (HOSPITAL_BASED_OUTPATIENT_CLINIC_OR_DEPARTMENT_OTHER): Payer: No Typology Code available for payment source

## 2013-10-30 ENCOUNTER — Ambulatory Visit (HOSPITAL_BASED_OUTPATIENT_CLINIC_OR_DEPARTMENT_OTHER): Payer: No Typology Code available for payment source

## 2013-10-30 ENCOUNTER — Ambulatory Visit (HOSPITAL_BASED_OUTPATIENT_CLINIC_OR_DEPARTMENT_OTHER): Payer: No Typology Code available for payment source | Admitting: Oncology

## 2013-10-30 ENCOUNTER — Other Ambulatory Visit: Payer: No Typology Code available for payment source

## 2013-10-30 ENCOUNTER — Encounter: Payer: Self-pay | Admitting: Oncology

## 2013-10-30 ENCOUNTER — Ambulatory Visit: Payer: No Typology Code available for payment source | Admitting: Adult Health

## 2013-10-30 VITALS — BP 120/75 | HR 80 | Temp 98.5°F | Resp 20 | Ht 64.0 in | Wt 177.7 lb

## 2013-10-30 DIAGNOSIS — C50412 Malignant neoplasm of upper-outer quadrant of left female breast: Secondary | ICD-10-CM

## 2013-10-30 DIAGNOSIS — C773 Secondary and unspecified malignant neoplasm of axilla and upper limb lymph nodes: Secondary | ICD-10-CM

## 2013-10-30 DIAGNOSIS — C50419 Malignant neoplasm of upper-outer quadrant of unspecified female breast: Secondary | ICD-10-CM

## 2013-10-30 DIAGNOSIS — C50919 Malignant neoplasm of unspecified site of unspecified female breast: Secondary | ICD-10-CM

## 2013-10-30 DIAGNOSIS — Z5111 Encounter for antineoplastic chemotherapy: Secondary | ICD-10-CM

## 2013-10-30 DIAGNOSIS — Z170 Estrogen receptor positive status [ER+]: Secondary | ICD-10-CM

## 2013-10-30 DIAGNOSIS — F17200 Nicotine dependence, unspecified, uncomplicated: Secondary | ICD-10-CM

## 2013-10-30 LAB — CBC WITH DIFFERENTIAL/PLATELET
BASO%: 0.6 % (ref 0.0–2.0)
Basophils Absolute: 0 10*3/uL (ref 0.0–0.1)
EOS%: 4 % (ref 0.0–7.0)
Eosinophils Absolute: 0.2 10*3/uL (ref 0.0–0.5)
HEMATOCRIT: 32.1 % — AB (ref 34.8–46.6)
HGB: 10.2 g/dL — ABNORMAL LOW (ref 11.6–15.9)
LYMPH%: 21.2 % (ref 14.0–49.7)
MCH: 25.8 pg (ref 25.1–34.0)
MCHC: 31.6 g/dL (ref 31.5–36.0)
MCV: 81.5 fL (ref 79.5–101.0)
MONO#: 0.5 10*3/uL (ref 0.1–0.9)
MONO%: 7.7 % (ref 0.0–14.0)
NEUT#: 4 10*3/uL (ref 1.5–6.5)
NEUT%: 66.5 % (ref 38.4–76.8)
PLATELETS: 270 10*3/uL (ref 145–400)
RBC: 3.94 10*6/uL (ref 3.70–5.45)
RDW: 27.6 % — ABNORMAL HIGH (ref 11.2–14.5)
WBC: 5.9 10*3/uL (ref 3.9–10.3)
lymph#: 1.3 10*3/uL (ref 0.9–3.3)

## 2013-10-30 LAB — COMPREHENSIVE METABOLIC PANEL (CC13)
ALK PHOS: 67 U/L (ref 40–150)
ALT: 22 U/L (ref 0–55)
ANION GAP: 13 meq/L — AB (ref 3–11)
AST: 20 U/L (ref 5–34)
Albumin: 3.6 g/dL (ref 3.5–5.0)
BILIRUBIN TOTAL: 0.2 mg/dL (ref 0.20–1.20)
BUN: 11.5 mg/dL (ref 7.0–26.0)
CO2: 22 mEq/L (ref 22–29)
CREATININE: 1 mg/dL (ref 0.6–1.1)
Calcium: 9.7 mg/dL (ref 8.4–10.4)
Chloride: 105 mEq/L (ref 98–109)
Glucose: 182 mg/dl — ABNORMAL HIGH (ref 70–140)
Potassium: 3.6 mEq/L (ref 3.5–5.1)
SODIUM: 140 meq/L (ref 136–145)
Total Protein: 7.9 g/dL (ref 6.4–8.3)

## 2013-10-30 MED ORDER — SODIUM CHLORIDE 0.9 % IJ SOLN
10.0000 mL | INTRAMUSCULAR | Status: DC | PRN
  Administered 2013-10-30: 10 mL
  Filled 2013-10-30: qty 10

## 2013-10-30 MED ORDER — DEXAMETHASONE SODIUM PHOSPHATE 20 MG/5ML IJ SOLN
INTRAMUSCULAR | Status: AC
  Filled 2013-10-30: qty 5

## 2013-10-30 MED ORDER — PALONOSETRON HCL INJECTION 0.25 MG/5ML
0.2500 mg | Freq: Once | INTRAVENOUS | Status: AC
  Administered 2013-10-30: 0.25 mg via INTRAVENOUS

## 2013-10-30 MED ORDER — DOXORUBICIN HCL CHEMO IV INJECTION 2 MG/ML
60.0000 mg/m2 | Freq: Once | INTRAVENOUS | Status: AC
  Administered 2013-10-30: 116 mg via INTRAVENOUS
  Filled 2013-10-30: qty 58

## 2013-10-30 MED ORDER — PALONOSETRON HCL INJECTION 0.25 MG/5ML
INTRAVENOUS | Status: AC
  Filled 2013-10-30: qty 5

## 2013-10-30 MED ORDER — SODIUM CHLORIDE 0.9 % IV SOLN
150.0000 mg | Freq: Once | INTRAVENOUS | Status: AC
  Administered 2013-10-30: 150 mg via INTRAVENOUS
  Filled 2013-10-30: qty 5

## 2013-10-30 MED ORDER — DEXAMETHASONE SODIUM PHOSPHATE 20 MG/5ML IJ SOLN
12.0000 mg | Freq: Once | INTRAMUSCULAR | Status: AC
  Administered 2013-10-30: 12 mg via INTRAVENOUS

## 2013-10-30 MED ORDER — SODIUM CHLORIDE 0.9 % IV SOLN
600.0000 mg/m2 | Freq: Once | INTRAVENOUS | Status: AC
  Administered 2013-10-30: 1160 mg via INTRAVENOUS
  Filled 2013-10-30: qty 58

## 2013-10-30 MED ORDER — SODIUM CHLORIDE 0.9 % IV SOLN
Freq: Once | INTRAVENOUS | Status: AC
  Administered 2013-10-30: 15:00:00 via INTRAVENOUS

## 2013-10-30 MED ORDER — HEPARIN SOD (PORK) LOCK FLUSH 100 UNIT/ML IV SOLN
500.0000 [IU] | Freq: Once | INTRAVENOUS | Status: AC | PRN
  Administered 2013-10-30: 500 [IU]
  Filled 2013-10-30: qty 5

## 2013-10-30 NOTE — Addendum Note (Signed)
Addended by: Maryanna Shape on: 10/30/2013 03:48 PM   Modules accepted: Orders, Medications

## 2013-10-30 NOTE — Patient Instructions (Signed)
[facility] Discharge Instructions for Patients Receiving Chemotherapy  Today you received the following chemotherapy agents Adriamycin/Cytoxan To help prevent nausea and vomiting after your treatment, we encourage you to take your nausea medication as prescribed.If you develop nausea and vomiting that is not controlled by your nausea medication, call the clinic.   BELOW ARE SYMPTOMS THAT SHOULD BE REPORTED IMMEDIATELY:  *FEVER GREATER THAN 100.5 F  *CHILLS WITH OR WITHOUT FEVER  NAUSEA AND VOMITING THAT IS NOT CONTROLLED WITH YOUR NAUSEA MEDICATION  *UNUSUAL SHORTNESS OF BREATH  *UNUSUAL BRUISING OR BLEEDING  TENDERNESS IN MOUTH AND THROAT WITH OR WITHOUT PRESENCE OF ULCERS  *URINARY PROBLEMS  *BOWEL PROBLEMS  UNUSUAL RASH Items with * indicate a potential emergency and should be followed up as soon as possible.  Feel free to call the clinic you have any questions or concerns. The clinic phone number is [phone].   Doxorubicin injection (Adriamycin) What is this medicine? DOXORUBICIN (dox oh ROO bi sin) is a chemotherapy drug. It is used to treat many kinds of cancer like Hodgkin's disease, leukemia, non-Hodgkin's lymphoma, neuroblastoma, sarcoma, and Wilms' tumor. It is also used to treat bladder cancer, breast cancer, lung cancer, ovarian cancer, stomach cancer, and thyroid cancer. This medicine may be used for other purposes; ask your health care provider or pharmacist if you have questions. COMMON BRAND NAME(S): Adriamycin PFS, Adriamycin RDF, Adriamycin, Rubex What should I tell my health care provider before I take this medicine? They need to know if you have any of these conditions: -blood disorders -heart disease, recent heart attack -infection (especially a virus infection such as chickenpox, cold sores, or herpes) -irregular heartbeat -liver disease -recent or ongoing radiation therapy -an unusual or allergic reaction to doxorubicin, other  chemotherapy agents, other medicines, foods, dyes, or preservatives -pregnant or trying to get pregnant -breast-feeding How should I use this medicine? This drug is given as an infusion into a vein. It is administered in a hospital or clinic by a specially trained health care professional. If you have pain, swelling, burning or any unusual feeling around the site of your injection, tell your health care professional right away. Talk to your pediatrician regarding the use of this medicine in children. Special care may be needed. Overdosage: If you think you have taken too much of this medicine contact a poison control center or emergency room at once. NOTE: This medicine is only for you. Do not share this medicine with others. What if I miss a dose? It is important not to miss your dose. Call your doctor or health care professional if you are unable to keep an appointment. What may interact with this medicine? Do not take this medicine with any of the following medications: -cisapride -droperidol -halofantrine -pimozide -zidovudine This medicine may also interact with the following medications: -chloroquine -chlorpromazine -clarithromycin -cyclophosphamide -cyclosporine -erythromycin -medicines for depression, anxiety, or psychotic disturbances -medicines for irregular heart beat like amiodarone, bepridil, dofetilide, encainide, flecainide, propafenone, quinidine -medicines for seizures like ethotoin, fosphenytoin, phenytoin -medicines for nausea, vomiting like dolasetron, ondansetron, palonosetron -medicines to increase blood counts like filgrastim, pegfilgrastim, sargramostim -methadone -methotrexate -pentamidine -progesterone -vaccines -verapamil Talk to your doctor or health care professional before taking any of these medicines: -acetaminophen -aspirin -ibuprofen -ketoprofen -naproxen This list may not describe all possible interactions. Give your health care provider a  list of all the medicines, herbs, non-prescription drugs, or dietary supplements you use. Also tell them if you smoke, drink alcohol, or use illegal drugs. Some  items may interact with your medicine. What should I watch for while using this medicine? Your condition will be monitored carefully while you are receiving this medicine. You will need important blood work done while you are taking this medicine. This drug may make you feel generally unwell. This is not uncommon, as chemotherapy can affect healthy cells as well as cancer cells. Report any side effects. Continue your course of treatment even though you feel ill unless your doctor tells you to stop. Your urine may turn red for a few days after your dose. This is not blood. If your urine is dark or brown, call your doctor. In some cases, you may be given additional medicines to help with side effects. Follow all directions for their use. Call your doctor or health care professional for advice if you get a fever, chills or sore throat, or other symptoms of a cold or flu. Do not treat yourself. This drug decreases your body's ability to fight infections. Try to avoid being around people who are sick. This medicine may increase your risk to bruise or bleed. Call your doctor or health care professional if you notice any unusual bleeding. Be careful brushing and flossing your teeth or using a toothpick because you may get an infection or bleed more easily. If you have any dental work done, tell your dentist you are receiving this medicine. Avoid taking products that contain aspirin, acetaminophen, ibuprofen, naproxen, or ketoprofen unless instructed by your doctor. These medicines may hide a fever. Men and women of childbearing age should use effective birth control methods while using taking this medicine. Do not become pregnant while taking this medicine. There is a potential for serious side effects to an unborn child. Talk to your health care  professional or pharmacist for more information. Do not breast-feed an infant while taking this medicine. Do not let others touch your urine or other body fluids for 5 days after each treatment with this medicine. Caregivers should wear latex gloves to avoid touching body fluids during this time. There is a maximum amount of this medicine you should receive throughout your life. The amount depends on the medical condition being treated and your overall health. Your doctor will watch how much of this medicine you receive in your lifetime. Tell your doctor if you have taken this medicine before. What side effects may I notice from receiving this medicine? Side effects that you should report to your doctor or health care professional as soon as possible: -allergic reactions like skin rash, itching or hives, swelling of the face, lips, or tongue -low blood counts - this medicine may decrease the number of white blood cells, red blood cells and platelets. You may be at increased risk for infections and bleeding. -signs of infection - fever or chills, cough, sore throat, pain or difficulty passing urine -signs of decreased platelets or bleeding - bruising, pinpoint red spots on the skin, black, tarry stools, blood in the urine -signs of decreased red blood cells - unusually weak or tired, fainting spells, lightheadedness -breathing problems -chest pain -fast, irregular heartbeat -mouth sores -nausea, vomiting -pain, swelling, redness at site where injected -pain, tingling, numbness in the hands or feet -swelling of ankles, feet, or hands -unusual bleeding or bruising Side effects that usually do not require medical attention (report to your doctor or health care professional if they continue or are bothersome): -diarrhea -facial flushing -hair loss -loss of appetite -missed menstrual periods -nail discoloration or damage -red or watery eyes -  red colored urine -stomach upset This list may not  describe all possible side effects. Call your doctor for medical advice about side effects. You may report side effects to FDA at 1-800-FDA-1088. Where should I keep my medicine? This drug is given in a hospital or clinic and will not be stored at home. NOTE: This sheet is a summary. It may not cover all possible information. If you have questions about this medicine, talk to your doctor, pharmacist, or health care provider.  2014, [employer]. (2012-09-17 09:54:34)  Cyclophosphamide injection (Cytoxan) What is this medicine? CYCLOPHOSPHAMIDE (sye kloe FOSS fa mide) is a chemotherapy drug. It slows the growth of cancer cells. This medicine is used to treat many types of cancer like lymphoma, myeloma, leukemia, breast cancer, and ovarian cancer, to name a few. This medicine may be used for other purposes; ask your health care provider or pharmacist if you have questions. COMMON BRAND NAME(S): Cytoxan, Neosar What should I tell my health care provider before I take this medicine? They need to know if you have any of these conditions: -blood disorders -history of other chemotherapy -infection -kidney disease -liver disease -recent or ongoing radiation therapy -tumors in the bone marrow -an unusual or allergic reaction to cyclophosphamide, other chemotherapy, other medicines, foods, dyes, or preservatives -pregnant or trying to get pregnant -breast-feeding How should I use this medicine? This drug is usually given as an injection into a vein or muscle or by infusion into a vein. It is administered in a hospital or clinic by a specially trained health care professional. Talk to your pediatrician regarding the use of this medicine in children. Special care may be needed. Overdosage: If you think you have taken too much of this medicine contact a poison control center or emergency room at once. NOTE: This medicine is only for you. Do not share this medicine with others. What if I  miss a dose? It is important not to miss your dose. Call your doctor or health care professional if you are unable to keep an appointment. What may interact with this medicine? This medicine may interact with the following medications: -amiodarone -amphotericin B -azathioprine -certain antiviral medicines for HIV or AIDS such as protease inhibitors (e.g., indinavir, ritonavir) and zidovudine -certain blood pressure medications such as benazepril, captopril, enalapril, fosinopril, lisinopril, moexipril, monopril, perindopril, quinapril, ramipril, trandolapril -certain cancer medications such as anthracyclines (e.g., daunorubicin, doxorubicin), busulfan, cytarabine, paclitaxel, pentostatin, tamoxifen, trastuzumab -certain diuretics such as chlorothiazide, chlorthalidone, hydrochlorothiazide, indapamide, metolazone -certain medicines that treat or prevent blood clots like warfarin -certain muscle relaxants such as succinylcholine -cyclosporine -etanercept -indomethacin -medicines to increase blood counts like filgrastim, pegfilgrastim, sargramostim -medicines used as general anesthesia -metronidazole -natalizumab This list may not describe all possible interactions. Give your health care provider a list of all the medicines, herbs, non-prescription drugs, or dietary supplements you use. Also tell them if you smoke, drink alcohol, or use illegal drugs. Some items may interact with your medicine. What should I watch for while using this medicine? Visit your doctor for checks on your progress. This drug may make you feel generally unwell. This is not uncommon, as chemotherapy can affect healthy cells as well as cancer cells. Report any side effects. Continue your course of treatment even though you feel ill unless your doctor tells you to stop. Drink water or other fluids as directed. Urinate often, even at night. In some cases, you may be given additional medicines to help with side effects.  Follow all directions for  their use. Call your doctor or health care professional for advice if you get a fever, chills or sore throat, or other symptoms of a cold or flu. Do not treat yourself. This drug decreases your body's ability to fight infections. Try to avoid being around people who are sick. This medicine may increase your risk to bruise or bleed. Call your doctor or health care professional if you notice any unusual bleeding. Be careful brushing and flossing your teeth or using a toothpick because you may get an infection or bleed more easily. If you have any dental work done, tell your dentist you are receiving this medicine. You may get drowsy or dizzy. Do not drive, use machinery, or do anything that needs mental alertness until you know how this medicine affects you. Do not become pregnant while taking this medicine or for 1 year after stopping it. Women should inform their doctor if they wish to become pregnant or think they might be pregnant. Men should not father a child while taking this medicine and for 4 months after stopping it. There is a potential for serious side effects to an unborn child. Talk to your health care professional or pharmacist for more information. Do not breast-feed an infant while taking this medicine. This medicine may interfere with the ability to have a child. This medicine has caused ovarian failure in some women. This medicine has caused reduced sperm counts in some men. You should talk with your doctor or health care professional if you are concerned about your fertility. If you are going to have surgery, tell your doctor or health care professional that you have taken this medicine. What side effects may I notice from receiving this medicine? Side effects that you should report to your doctor or health care professional as soon as possible: -allergic reactions like skin rash, itching or hives, swelling of the face, lips, or tongue -low blood counts - this  medicine may decrease the number of white blood cells, red blood cells and platelets. You may be at increased risk for infections and bleeding. -signs of infection - fever or chills, cough, sore throat, pain or difficulty passing urine -signs of decreased platelets or bleeding - bruising, pinpoint red spots on the skin, black, tarry stools, blood in the urine -signs of decreased red blood cells - unusually weak or tired, fainting spells, lightheadedness -breathing problems -dark urine -dizziness -palpitations -swelling of the ankles, feet, hands -trouble passing urine or change in the amount of urine -weight gain -yellowing of the eyes or skin Side effects that usually do not require medical attention (report to your doctor or health care professional if they continue or are bothersome): -changes in nail or skin color -hair loss -missed menstrual periods -mouth sores -nausea, vomiting This list may not describe all possible side effects. Call your doctor for medical advice about side effects. You may report side effects to FDA at 1-800-FDA-1088. Where should I keep my medicine? This drug is given in a hospital or clinic and will not be stored at home. NOTE: This sheet is a summary. It may not cover all possible information. If you have questions about this medicine, talk to your doctor, pharmacist, or health care provider.  2014, [employer]. (2012-04-05 16:22:58)

## 2013-10-30 NOTE — Progress Notes (Signed)
[place]  Telephone:([phone]) [phone] Fax:(336) [phone]     ID: [name] OB: 03-May-1971  MR#: [id number]  FIE#:[id number]  PCP: No PCP Per Patient GYN:  [name] SU: [name] OTHER MD: Thea Silversmith  CHIEF COMPLAINT: Breast cancer/ under active treatment  BREAST CANCER HISTORY: Mikenna palpated a mass in her left breast mid-April 2015 and immediately brought it to Dr. Benjie Karvonen' attention. She was set up for bilateral screening mammography with tomography at [facility] hospital 09/19/2013. The possible distortion in the left breast and a nearby group of calcifications was felt to warrant further evaluation, and on 09/30/2013 she underwent unilateral left diagnostic mammography and ultrasonography. There was an irregular mass in the outer left breast associated with microcalcifications. 4 cm anterior to this there was another cluster of pleomorphic calcifications spanning 7 mm. He had more anteriorly there was an irregular mass associated with other calcifications. In total the abnormalities in the left breast spent approximately 10 cm, and is segmental distribution. On exam there was a palpable firm mass at the 2:30 o'clock position of the left breast 10 cm from the nipple. There was palpable adenopathy in the inferior left axilla. Ultrasound confirmed an irregular hypoechoic mass in the left breast measuring 4.6 cm. In addition, a 1.4 cm oval slightly irregular mass was noted at the 3:00 position and yet another mass measured 9 mm in the same area. Ultrasound of the left axilla showed a dominant 3.6 cm lymph node.  Biopsy of 2 of the left breast masses and the suspicious left breast lymph node on may 01/03/2014 showed ([id number]) an invasive ductal carcinoma, grade 2, estrogen receptor 90% positive with strong staining intensity, progesterone receptor 40% positive but moderate staining intensity, with an MIB-1 of 20% and no HER-2 amplification. (Because all 3 biopsies  were morphologically identical, only one prognostic panel was sent).  On may 01/22/2014 the patient underwent bilateral breast MRI. This showed numerous enhancing masses in the left breast, the largest measuring 3.5 cm, and the aggregate measuring 12.7 cm. There were numerous enlarged left axillary lymph nodes, at least 5 of which were enlarged, both at levels 1 and 2. The largest measured 2.3 cm. The right breast was negative.  The patient's subsequent history is as detailed below.   INTERVAL HISTORY: Kamile returns today for followup of her breast cancer accompanied by her mother. She is ready to start her chemotherapy. She has attended a chemotherapy education class, had a PAC placement, and had her 2D echocardiogram performed.   REVIEW OF SYSTEMS: She is feeling very tired. She is going to be on disability is throughout her treatment. The application is in process. She is having some night sweats, and feels anxious, but not depressed. A detailed review of systems today was otherwise noncontributory  PAST MEDICAL HISTORY: Past Medical History  Diagnosis Date  . Diabetes mellitus   . Hypertension   . Sarcoidosis   . Anxiety   . Depression   . Anemia   . Complication of anesthesia     makes pt. restless and unable to be still  . Cancer     breast cancer    PAST SURGICAL HISTORY: Past Surgical History  Procedure Laterality Date  . Tubal ligation  2001  . Endobronchial ultrasound Bilateral 12/02/2012    Procedure: ENDOBRONCHIAL ULTRASOUND;  Surgeon: Collene Gobble, MD;  Location: [facility] END[facility];  Service: Cardiopulmonary;  Laterality: Bilateral;  . Breast surgery Left     breast biopsy times 3  .  Portacath placement N/A 10/22/2013    Procedure: INSERTION PORT-A-CATH;  Surgeon: [name], MD;  Location: [facility] OR;  Service: General;  Laterality: N/A;    FAMILY HISTORY Family History  Problem Relation Age of Onset  . Cancer Paternal Aunt     "bone cancer"; deceased 27s  . Cancer  Paternal Uncle     stomach cancer; deceased 51s  . Cancer Paternal Aunt     unk. primary; currently 39s  . Prostate cancer Maternal Grandfather 30  The patient's parents are living, in fair mid to late 48s. The patient had one brother, no sisters. There is no history of breast or ovarian cancer in the family to her knowledge.   GYNECOLOGIC HISTORY:   menarche age 44, first live birth at 2. She is GX P2. She was still having regular periods at the time of the start of her chemotherapy.  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 She is single and lives alone, although her mother is currently staying with her. Her son Janaiyah Blackard lives in [place] and works at [employer]. Son [name] lives in [place] where he works at a [employer]. The patient has 1 grandchild on the way. She is a Psychologist, forensic.    ADVANCED DIRECTIVES: not in place   HEALTH MAINTENANCE: History  Substance Use Topics  . Smoking status: Former Smoker -- 1.00 packs/day for 23 years    Types: Cigarettes    Quit date: 10/19/2013  . Smokeless tobacco: Never Used  . Alcohol Use: 1.0 oz/week    2 drink(s) per week     Comment: occ. x2 dks per week     Colonoscopy:  PAP:  Bone density: November 2013  Lipid panel:  Allergies  Allergen Reactions  . Nyquil Multi-Symptom [Pseudoeph-Doxylamine-Dm-Apap] Itching    Skin peels on hands, and feet    Current Outpatient Prescriptions  Medication Sig Dispense Refill  . albuterol (PROVENTIL HFA;VENTOLIN HFA) 108 (90 BASE) MCG/ACT inhaler Inhale 2 puffs into the lungs every 6 (six) hours as needed for wheezing or shortness of breath.      Marland Kitchen atenolol-chlorthalidone (TENORETIC) 50-25 MG per tablet Take 1 tablet by mouth daily.  30 tablet  1  . dexamethasone (DECADRON) 4 MG tablet Take 2 tablets by mouth once a day on the day after chemotherapy and then take 2 tablets two times a day for 2 days. Take with food.  30 tablet  1  . docusate  sodium (COLACE) 100 MG capsule Take 100 mg by mouth daily.      . ferrous sulfate 325 (65 FE) MG tablet Take 325 mg by mouth 2 (two) times daily with a meal.      . lidocaine-prilocaine (EMLA) cream Apply 1 application topically as needed. Apply over port area 1-2 hours before chemo and cover with plastic wrap  30 g  0  . LORazepam (ATIVAN) 0.5 MG tablet Take 1 tablet (0.5 mg total) by mouth at bedtime as needed (Nausea or vomiting).  30 tablet  0  . metFORMIN (GLUCOPHAGE) 500 MG tablet Take 1 tablet (500 mg total) by mouth 2 (two) times daily with a meal.  60 tablet  1  . Multiple Vitamin (MULTIVITAMIN WITH MINERALS) TABS tablet Take 1 tablet by mouth daily.      . norethindrone (AYGESTIN) 5 MG tablet Take 5 mg by mouth daily.       Marland Kitchen oxyCODONE-acetaminophen (ROXICET) 5-325 MG per tablet Take 1-2 tablets by mouth every 4 (  four) hours as needed for severe pain.  30 tablet  0  . prochlorperazine (COMPAZINE) 10 MG tablet Take 1 tablet (10 mg total) by mouth every 6 (six) hours as needed (Nausea or vomiting).  30 tablet  1   No current facility-administered medications for this visit.   Facility-Administered Medications Ordered in Other Visits  Medication Dose Route Frequency Provider Last Rate Last Dose  . cyclophosphamide (CYTOXAN) 1,160 mg in sodium chloride 0.9 % 250 mL chemo infusion  600 mg/m2 (Treatment Plan Actual) Intravenous Once Chauncey Cruel, MD      . DOXOrubicin (ADRIAMYCIN) chemo injection 116 mg  60 mg/m2 (Treatment Plan Actual) Intravenous Once Chauncey Cruel, MD      . fosaprepitant (EMEND) 150 mg in sodium chloride 0.9 % 145 mL IVPB  150 mg Intravenous Once Chauncey Cruel, MD 300 mL/hr at 10/30/13 1503 150 mg at 10/30/13 1503  . heparin lock flush 100 unit/mL  500 Units Intracatheter Once PRN Chauncey Cruel, MD      . sodium chloride 0.9 % injection 10 mL  10 mL Intracatheter PRN Chauncey Cruel, MD        OBJECTIVE: Middle-aged African American woman in no acute  distress Filed Vitals:   10/30/13 1346  BP: 120/75  Pulse: 80  Temp: 98.5 F (36.9 C)  Resp: 20     Body mass index is 30.49 kg/(m^2).    ECOG FS:1 - Symptomatic but completely ambulatory  Sclerae unicteric, pupils equal, round and reactive Oropharynx clear and moist No cervical or supraclavicular adenopathy Lungs no rales or rhonchi Heart regular rate and rhythm Abd soft, nontender, positive bowel sounds MSK no focal spinal tenderness, no upper extremity lymphedema Neuro: nonfocal, well oriented, appropriate affect Breasts: Deferred.  LAB RESULTS:  CMP     Component Value Date/Time   NA 140 10/30/2013 1332   NA 140 10/21/2013 1158   K 3.6 10/30/2013 1332   K 3.9 10/21/2013 1158   CL 105 10/21/2013 1158   CO2 22 10/30/2013 1332   CO2 21 10/21/2013 1158   GLUCOSE 182* 10/30/2013 1332   GLUCOSE 107* 10/21/2013 1158   BUN 11.5 10/30/2013 1332   BUN 16 10/21/2013 1158   CREATININE 1.0 10/30/2013 1332   CREATININE 0.95 10/21/2013 1158   CALCIUM 9.7 10/30/2013 1332   CALCIUM 9.9 10/21/2013 1158   PROT 7.9 10/30/2013 1332   PROT 8.5* 11/27/2012 0900   ALBUMIN 3.6 10/30/2013 1332   ALBUMIN 4.0 11/27/2012 0900   AST 20 10/30/2013 1332   AST 22 11/27/2012 0900   ALT 22 10/30/2013 1332   ALT 17 11/27/2012 0900   ALKPHOS 67 10/30/2013 1332   ALKPHOS 81 11/27/2012 0900   BILITOT 0.20 10/30/2013 1332   BILITOT 0.1* 11/27/2012 0900   GFRNONAA 72* 10/21/2013 1158   GFRAA 83* 10/21/2013 1158    I No results found for this basename: SPEP,  UPEP,   kappa and lambda light chains    Lab Results  Component Value Date   WBC 5.9 10/30/2013   NEUTROABS 4.0 10/30/2013   HGB 10.2* 10/30/2013   HCT 32.1* 10/30/2013   MCV 81.5 10/30/2013   PLT 270 10/30/2013      Chemistry      Component Value Date/Time   NA 140 10/30/2013 1332   NA 140 10/21/2013 1158   K 3.6 10/30/2013 1332   K 3.9 10/21/2013 1158   CL 105 10/21/2013 1158   CO2 22 10/30/2013 1332  CO2 21 10/21/2013 1158   BUN 11.5 10/30/2013 1332   BUN  16 10/21/2013 1158   CREATININE 1.0 10/30/2013 1332   CREATININE 0.95 10/21/2013 1158      Component Value Date/Time   CALCIUM 9.7 10/30/2013 1332   CALCIUM 9.9 10/21/2013 1158   ALKPHOS 67 10/30/2013 1332   ALKPHOS 81 11/27/2012 0900   AST 20 10/30/2013 1332   AST 22 11/27/2012 0900   ALT 22 10/30/2013 1332   ALT 17 11/27/2012 0900   BILITOT 0.20 10/30/2013 1332   BILITOT 0.1* 11/27/2012 0900       No results found for this basename: LABCA2    No components found with this basename: LABCA125    No results found for this basename: INR,  in the last 168 hours  Urinalysis    Component Value Date/Time   COLORURINE YELLOW 01/04/2012 0246   APPEARANCEUR CLOUDY* 01/04/2012 0246   LABSPEC 1.030 01/04/2012 0246   PHURINE 6.5 01/04/2012 0246   GLUCOSEU NEGATIVE 01/04/2012 0246   HGBUR LARGE* 01/04/2012 0246   BILIRUBINUR NEGATIVE 01/04/2012 0246   KETONESUR NEGATIVE 01/04/2012 0246   PROTEINUR NEGATIVE 01/04/2012 0246   UROBILINOGEN 0.2 01/04/2012 0246   NITRITE NEGATIVE 01/04/2012 0246   LEUKOCYTESUR NEGATIVE 01/04/2012 0246    STUDIES: Echocardiogram (10/29/13) Study Conclusions  - Left ventricle: The cavity size was normal. There was moderate concentric hypertrophy. Systolic function was normal. The estimated ejection fraction was in the range of 55% to 60%. Wall motion was normal; there were no regional wall motion abnormalities. - Left atrium: The atrium was mildly dilated. - Atrial septum: No defect or patent foramen ovale was identified.  Impressions:  - Global LV longitudinal strain -16.7%.   ASSESSMENT: 43 y.o. [place] woman status post left breast and left axillary lymph node biopsy 10/03/2013, both positive for a clinical T2 N2, stage IIIA invasive ductal carcinoma, grade 2, estrogen and progesterone receptor positive, HER-2 negative, with an MIB-1 of 20%.  (1) neoadjuvant chemotherapy to consist of doxorubicin and cyclophosphamide in dose dense fashion x4, with Neulasta support, to be  followed by paclitaxel weekly x12  (2) definitive surgery to follow chemotherapy  (3) radiation to follow surgery  (4) tamoxifen to be started at the end of radiation  (5) continuing tobacco abuse: The patient has been strongly advised to discontinue smoking  (6) genetic testing sent 10/16/2013, results pending  PLAN: Adysson is ready to proceed with her first cycle of AC. She has all of her anti-emetics and knows how to take them. All of her questions were answered today.  She will have an U/S of her thyroid tomorrow to f/u on the uptake noted on recent PET scan.   Tymara has a good understanding of the overall plan. She agrees with it. She knows the goal of treatment in her case is cure. Follow-up in 1 week. She will call with any problems that may develop before her next visit here.   Maryanna Shape, NP   10/30/2013 3:26 PM

## 2013-10-30 NOTE — Progress Notes (Signed)
Adriamycin administered through a free dripping normal saline line. Positive blood return noted before, every 5 mL during and after Adriamycin administration.

## 2013-10-31 ENCOUNTER — Ambulatory Visit (HOSPITAL_BASED_OUTPATIENT_CLINIC_OR_DEPARTMENT_OTHER): Payer: No Typology Code available for payment source

## 2013-10-31 ENCOUNTER — Ambulatory Visit (HOSPITAL_COMMUNITY)
Admission: RE | Admit: 2013-10-31 | Discharge: 2013-10-31 | Disposition: A | Discharge status: Home / Self Care | Payer: No Typology Code available for payment source | Source: Ambulatory Visit | Attending: Oncology | Admitting: Oncology

## 2013-10-31 ENCOUNTER — Other Ambulatory Visit: Payer: Self-pay | Admitting: *Deleted

## 2013-10-31 VITALS — BP 133/80 | HR 78 | Temp 98.0°F

## 2013-10-31 DIAGNOSIS — E041 Nontoxic single thyroid nodule: Secondary | ICD-10-CM | POA: Insufficient documentation

## 2013-10-31 DIAGNOSIS — C50419 Malignant neoplasm of upper-outer quadrant of unspecified female breast: Secondary | ICD-10-CM

## 2013-10-31 DIAGNOSIS — C773 Secondary and unspecified malignant neoplasm of axilla and upper limb lymph nodes: Secondary | ICD-10-CM

## 2013-10-31 DIAGNOSIS — C50919 Malignant neoplasm of unspecified site of unspecified female breast: Secondary | ICD-10-CM

## 2013-10-31 DIAGNOSIS — R9402 Abnormal brain scan: Secondary | ICD-10-CM

## 2013-10-31 DIAGNOSIS — C50412 Malignant neoplasm of upper-outer quadrant of left female breast: Secondary | ICD-10-CM

## 2013-10-31 DIAGNOSIS — Z5189 Encounter for other specified aftercare: Secondary | ICD-10-CM

## 2013-10-31 MED ORDER — LORATADINE 10 MG PO CAPS: 1.0000 | ORAL_CAPSULE | ORAL | Status: DC

## 2013-10-31 MED ORDER — PEGFILGRASTIM INJECTION 6 MG/0.6ML
6.0000 mg | Freq: Once | SUBCUTANEOUS | Status: AC
  Administered 2013-10-31: 6 mg via SUBCUTANEOUS
  Filled 2013-10-31: qty 0.6

## 2013-10-31 NOTE — Patient Instructions (Signed)

## 2013-11-01 ENCOUNTER — Other Ambulatory Visit: Payer: Self-pay | Admitting: Oncology

## 2013-11-01 DIAGNOSIS — E0789 Other specified disorders of thyroid: Secondary | ICD-10-CM

## 2013-11-03 ENCOUNTER — Telehealth: Payer: Self-pay | Admitting: *Deleted

## 2013-11-03 NOTE — Telephone Encounter (Signed)
Spoke with patient.  She had her first chemotherapy with Adriamycin and Cytoxan on 5/28.  Pt. States she is doing very well.  She denies nausea, vomiting, diarrhea, constipation or mouth ulcers.  Her one c/o is blurred vision that comes and goes. It was fine this am and then this afternoon the blurrines is returning.  She notices it when she is looking at the numbers on the cable box on the TV.  She is diabetic and on metformin.  Her glucose monitor is broken right now.  Patient states she feels fine.    Discussed with pharmacist.  Blurred vision is most likely a side effect of the decadron, although high blood sugar could also contribute to this.   Let patient know the decadron is possibly the culprit and it should improve.  Her last dose of decadron was yesterday am.  Also encouraged her to get a glucose monitor to have on hand.  She is to be especially careful of her sugar intake while on the decadron. She has no further questions at this time.  Encouraged her to call us if she has problems or questions.  She appreciated the phone call.

## 2013-11-03 NOTE — Telephone Encounter (Signed)
Pt left message stating concern due to noted blurred vision " is this from my chemo or the pills I take after ?"  Return call number given as [phone].  Message left for a return call to this RN.

## 2013-11-05 ENCOUNTER — Other Ambulatory Visit: Payer: Self-pay | Admitting: *Deleted

## 2013-11-05 DIAGNOSIS — C50412 Malignant neoplasm of upper-outer quadrant of left female breast: Secondary | ICD-10-CM

## 2013-11-06 ENCOUNTER — Encounter: Payer: Self-pay | Admitting: Physician Assistant

## 2013-11-06 ENCOUNTER — Ambulatory Visit (HOSPITAL_BASED_OUTPATIENT_CLINIC_OR_DEPARTMENT_OTHER): Payer: No Typology Code available for payment source | Admitting: Physician Assistant

## 2013-11-06 ENCOUNTER — Telehealth: Payer: Self-pay | Admitting: Oncology

## 2013-11-06 ENCOUNTER — Other Ambulatory Visit (HOSPITAL_BASED_OUTPATIENT_CLINIC_OR_DEPARTMENT_OTHER): Payer: No Typology Code available for payment source

## 2013-11-06 VITALS — BP 109/75 | HR 84 | Temp 98.6°F | Resp 18 | Ht 64.0 in | Wt 176.0 lb

## 2013-11-06 DIAGNOSIS — C50419 Malignant neoplasm of upper-outer quadrant of unspecified female breast: Secondary | ICD-10-CM

## 2013-11-06 DIAGNOSIS — D701 Agranulocytosis secondary to cancer chemotherapy: Secondary | ICD-10-CM | POA: Insufficient documentation

## 2013-11-06 DIAGNOSIS — C773 Secondary and unspecified malignant neoplasm of axilla and upper limb lymph nodes: Secondary | ICD-10-CM

## 2013-11-06 DIAGNOSIS — C50412 Malignant neoplasm of upper-outer quadrant of left female breast: Secondary | ICD-10-CM

## 2013-11-06 DIAGNOSIS — E119 Type 2 diabetes mellitus without complications: Secondary | ICD-10-CM

## 2013-11-06 DIAGNOSIS — Z170 Estrogen receptor positive status [ER+]: Secondary | ICD-10-CM

## 2013-11-06 DIAGNOSIS — C50919 Malignant neoplasm of unspecified site of unspecified female breast: Secondary | ICD-10-CM

## 2013-11-06 DIAGNOSIS — T451X5A Adverse effect of antineoplastic and immunosuppressive drugs, initial encounter: Secondary | ICD-10-CM

## 2013-11-06 DIAGNOSIS — A6000 Herpesviral infection of urogenital system, unspecified: Secondary | ICD-10-CM

## 2013-11-06 DIAGNOSIS — D649 Anemia, unspecified: Secondary | ICD-10-CM

## 2013-11-06 DIAGNOSIS — D702 Other drug-induced agranulocytosis: Secondary | ICD-10-CM

## 2013-11-06 DIAGNOSIS — F17200 Nicotine dependence, unspecified, uncomplicated: Secondary | ICD-10-CM

## 2013-11-06 DIAGNOSIS — D869 Sarcoidosis, unspecified: Secondary | ICD-10-CM

## 2013-11-06 DIAGNOSIS — E041 Nontoxic single thyroid nodule: Secondary | ICD-10-CM | POA: Insufficient documentation

## 2013-11-06 LAB — CBC WITH DIFFERENTIAL/PLATELET
BASO%: 0.5 % (ref 0.0–2.0)
Basophils Absolute: 0 10*3/uL (ref 0.0–0.1)
EOS ABS: 0.1 10*3/uL (ref 0.0–0.5)
EOS%: 12.8 % — ABNORMAL HIGH (ref 0.0–7.0)
HCT: 33.4 % — ABNORMAL LOW (ref 34.8–46.6)
HEMOGLOBIN: 10.6 g/dL — AB (ref 11.6–15.9)
LYMPH#: 0.7 10*3/uL — AB (ref 0.9–3.3)
LYMPH%: 66.5 % — ABNORMAL HIGH (ref 14.0–49.7)
MCH: 25.6 pg (ref 25.1–34.0)
MCHC: 31.7 g/dL (ref 31.5–36.0)
MCV: 80.7 fL (ref 79.5–101.0)
MONO#: 0.1 10*3/uL (ref 0.1–0.9)
MONO%: 5 % (ref 0.0–14.0)
NEUT%: 15.2 % — ABNORMAL LOW (ref 38.4–76.8)
NEUTROS ABS: 0.2 10*3/uL — AB (ref 1.5–6.5)
Platelets: 187 10*3/uL (ref 145–400)
RBC: 4.14 10*6/uL (ref 3.70–5.45)
RDW: 26.2 % — AB (ref 11.2–14.5)
WBC: 1.1 10*3/uL — ABNORMAL LOW (ref 3.9–10.3)

## 2013-11-06 LAB — COMPREHENSIVE METABOLIC PANEL (CC13)
ALT: 21 U/L (ref 0–55)
AST: 14 U/L (ref 5–34)
Albumin: 3.4 g/dL — ABNORMAL LOW (ref 3.5–5.0)
Alkaline Phosphatase: 81 U/L (ref 40–150)
Anion Gap: 12 mEq/L — ABNORMAL HIGH (ref 3–11)
BUN: 10.1 mg/dL (ref 7.0–26.0)
CO2: 19 meq/L — AB (ref 22–29)
Calcium: 9.2 mg/dL (ref 8.4–10.4)
Chloride: 105 mEq/L (ref 98–109)
Creatinine: 0.8 mg/dL (ref 0.6–1.1)
GLUCOSE: 107 mg/dL (ref 70–140)
POTASSIUM: 3.6 meq/L (ref 3.5–5.1)
Sodium: 136 mEq/L (ref 136–145)
Total Bilirubin: 0.24 mg/dL (ref 0.20–1.20)
Total Protein: 7.6 g/dL (ref 6.4–8.3)

## 2013-11-06 MED ORDER — ACYCLOVIR 200 MG PO CAPS: 200.0000 mg | ORAL_CAPSULE | Freq: Every day | ORAL | Status: DC

## 2013-11-06 MED ORDER — CIPROFLOXACIN HCL 500 MG PO TABS: 500.0000 mg | ORAL_TABLET | Freq: Two times a day (BID) | ORAL | Status: DC

## 2013-11-06 MED ORDER — ACYCLOVIR 400 MG PO TABS: 400.0000 mg | ORAL_TABLET | Freq: Two times a day (BID) | ORAL | Status: DC

## 2013-11-06 NOTE — Telephone Encounter (Signed)
[facility] will contact pt re appt for bx. no other orders per 6/4 pof.

## 2013-11-06 NOTE — Progress Notes (Signed)
[place]  [phone] [phone]     ID: Shelly Hall OB: 02-13-1971  MR#: [id number]  SWN#:[id number]  PCP: No PCP Per Patient GYN:  [name] SU: [name] OTHER MD: Thea Silversmith  CHIEF COMPLAINT:  Left Breast Cancer/Neoadjuvant Chemotherapy  BREAST CANCER HISTORY: Shelly Hall palpated a mass in her left breast mid-April 2015 and immediately brought it to Dr. Benjie Karvonen' attention. She was set up for bilateral screening mammography with tomography at [facility] hospital 09/19/2013. The possible distortion in the left breast and a nearby group of calcifications was felt to warrant further evaluation, and on 09/30/2013 she underwent unilateral left diagnostic mammography and ultrasonography. There was an irregular mass in the outer left breast associated with microcalcifications. 4 cm anterior to this there was another cluster of pleomorphic calcifications spanning 7 mm. Shelly Hall had more anteriorly there was an irregular mass associated with other calcifications. In total the abnormalities in the left breast spent approximately 10 cm, and is segmental distribution. On exam there was a palpable firm mass at the 2:30 o'clock position of the left breast 10 cm from the nipple. There was palpable adenopathy in the inferior left axilla. Ultrasound confirmed an irregular hypoechoic mass in the left breast measuring 4.6 cm. In addition, a 1.4 cm oval slightly irregular mass was noted at the 3:00 position and yet another mass measured 9 mm in the same area. Ultrasound of the left axilla showed a dominant 3.6 cm lymph node.  Biopsy of 2 of the left breast masses and the suspicious left breast lymph node on 10/03/2013 showed ([id number]) an invasive ductal carcinoma, grade 2, estrogen receptor 90% positive with strong staining intensity, progesterone receptor 40% positive but moderate staining intensity, with an MIB-1 of 20% and no HER-2 amplification. (Because all 3 biopsies  were morphologically identical, only one prognostic panel was sent).  On 10/10/2013 the patient underwent bilateral breast MRI. This showed numerous enhancing masses in the left breast, the largest measuring 3.5 cm, and the aggregate measuring 12.7 cm. There were numerous enlarged left axillary lymph nodes, at least 5 of which were enlarged, both at levels 1 and 2. The largest measured 2.3 cm. The right breast was negative.  The patient's subsequent history is as detailed below.   INTERVAL HISTORY: Shelly Hall returns alone today for followup of her locally advanced left breast cancer for which she is receiving neoadjuvant chemotherapy. She is currently day 8 cycle 1 of 4 planned dose dense cycles of doxorubicin/cyclophosphamide. She receives Neulasta on day 2 for granulocyte support.  Overall, Shelly Hall tolerated this first cycle well. She tells me she "felt fine" for the first several days, but was extremely tired earlier this week after working on Monday and Tuesday. She still feels somewhat weak and fatigued today. She is following her blood glucose at home, and her highest reading at home has been approximately 175. She continues on metformin. She is keeping herself well hydrated.  Interval history is notable for the fact that Shelly Hall had an ultrasound of her thyroid on 10/31/2013 which showed a 10 mm solid well-circumscribed left upper pole thyroid nodule, and a FNA biopsy was recommended. This is detailed below.  REVIEW OF SYSTEMS: Shelly Hall denies any fevers or chills. She's has had a recurrence of genital herpes beginning yesterday. Otherwise, she has had no rashes or skin changes and denies any abnormal bruising or bleeding. She's had no mouth ulcers or oral sensitivity, and also denies any nausea or emesis. She has had some  mild constipation for which she has been taking Colace and drinking prune juice. This has been effective thus far. She denies any increased cough, phlegm production, shortness of  breath, chest pain, or palpitations. She's had no abnormal headaches or dizziness. Her vision has been somewhat blurry, but seems better today. She currently denies any unusual myalgias, arthralgias, or bony pain, and has had no peripheral swelling.  A detailed review of systems is otherwise stable and noncontributory.    PAST MEDICAL HISTORY: Past Medical History  Diagnosis Date  . Diabetes mellitus   . Hypertension   . Sarcoidosis   . Anxiety   . Depression   . Anemia   . Complication of anesthesia     makes pt. restless and unable to be still  . Cancer     breast cancer    PAST SURGICAL HISTORY: Past Surgical History  Procedure Laterality Date  . Tubal ligation  2001  . Endobronchial ultrasound Bilateral 12/02/2012    Procedure: ENDOBRONCHIAL ULTRASOUND;  Surgeon: Collene Gobble, MD;  Location: [facility] ENDOSCOPY;  Service: Cardiopulmonary;  Laterality: Bilateral;  . Breast surgery Left     breast biopsy times 3  . Portacath placement N/A 10/22/2013    Procedure: INSERTION PORT-A-CATH;  Surgeon: [name], MD;  Location: [facility] OR;  Service: General;  Laterality: N/A;    FAMILY HISTORY Family History  Problem Relation Age of Onset  . Cancer Paternal Aunt     "bone cancer"; deceased 75s  . Cancer Paternal Uncle     stomach cancer; deceased 20s  . Cancer Paternal Aunt     unk. primary; currently 48s  . Prostate cancer Maternal Grandfather 73  The patient's parents are living, in fair mid to late 54s. The patient had one brother, no sisters. There is no history of breast or ovarian cancer in the family to her knowledge.   GYNECOLOGIC HISTORY:   (Reviewed 11/06/2013)  menarche age 89, first live birth at 65. She is GX P2. She was still having regular periods at the time of the start of her chemotherapy in May 2015.  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 She is single and lives alone, although her mother is currently staying with her. Her  son Shelly Hall lives in [place] and works at [employer]. Son Shelly Hall lives in [place] where Shelly Hall works at a [employer]. The patient has 1 grandchild on the way. She is a Psychologist, forensic.    ADVANCED DIRECTIVES: not in place   HEALTH MAINTENANCE: (Updated 11/06/2013) History  Substance Use Topics  . Smoking status: Former Smoker -- 1.00 packs/day for 23 years    Types: Cigarettes    Quit date: 10/19/2013  . Smokeless tobacco: Never Used  . Alcohol Use: 1.0 oz/week    2 drink(s) per week     Comment: occ. x2 dks per week     Colonoscopy: Never  PAP: Not on file  Bone density: Not on file  Lipid panel:  Not on file  Allergies  Allergen Reactions  . Nyquil Multi-Symptom [Pseudoeph-Doxylamine-Dm-Apap] Itching    Skin peels on hands, and feet    Current Outpatient Prescriptions  Medication Sig Dispense Refill  . atenolol-chlorthalidone (TENORETIC) 50-25 MG per tablet Take 1 tablet by mouth daily.  30 tablet  1  . docusate sodium (COLACE) 100 MG capsule Take 100 mg by mouth daily.      . ferrous sulfate 325 (65 FE) MG tablet Take 325  mg by mouth 2 (two) times daily with a meal.      . metFORMIN (GLUCOPHAGE) 500 MG tablet Take 1 tablet (500 mg total) by mouth 2 (two) times daily with a meal.  60 tablet  1  . Multiple Vitamin (MULTIVITAMIN WITH MINERALS) TABS tablet Take 1 tablet by mouth daily.      Marland Kitchen acyclovir (ZOVIRAX) 200 MG capsule Take 1 capsule (200 mg total) by mouth 5 (five) times daily. X 5 days  25 capsule  0  . acyclovir (ZOVIRAX) 400 MG tablet Take 1 tablet (400 mg total) by mouth 2 (two) times daily. Beginning 11/12/2013  60 tablet  4  . albuterol (PROVENTIL HFA;VENTOLIN HFA) 108 (90 BASE) MCG/ACT inhaler Inhale 2 puffs into the lungs every 6 (six) hours as needed for wheezing or shortness of breath.      . ciprofloxacin (CIPRO) 500 MG tablet Take 1 tablet (500 mg total) by mouth 2 (two) times daily. X 7 days  14 tablet  3  . dexamethasone  (DECADRON) 4 MG tablet Take 2 tablets by mouth once a day on the day after chemotherapy and then take 2 tablets two times a day for 2 days. Take with food.  30 tablet  1  . HYDROcodone-acetaminophen (NORCO/VICODIN) 5-325 MG per tablet       . lidocaine-prilocaine (EMLA) cream Apply 1 application topically as needed. Apply over port area 1-2 hours before chemo and cover with plastic wrap  30 g  0  . Loratadine 10 MG CAPS Take 1 capsule (10 mg total) by mouth as directed.  30 each  3  . LORazepam (ATIVAN) 0.5 MG tablet Take 1 tablet (0.5 mg total) by mouth at bedtime as needed (Nausea or vomiting).  30 tablet  0  . oxyCODONE-acetaminophen (ROXICET) 5-325 MG per tablet Take 1-2 tablets by mouth every 4 (four) hours as needed for severe pain.  30 tablet  0  . prochlorperazine (COMPAZINE) 10 MG tablet Take 1 tablet (10 mg total) by mouth every 6 (six) hours as needed (Nausea or vomiting).  30 tablet  1   No current facility-administered medications for this visit.    OBJECTIVE: Middle-aged [place] American woman who appears tired but is in no acute distress Filed Vitals:   11/06/13 1519  BP: 109/75  Pulse: 84  Temp: 98.6 F (37 C)  Resp: 18     Body mass index is 30.2 kg/(m^2).    ECOG FS:1 - Symptomatic but completely ambulatory Filed Weights   11/06/13 1519  Weight: 176 lb (79.833 kg)   Physical Exam: HEENT:  Sclerae anicteric.  Oropharynx clear, pink, and moist. No ulcerations or mucositis. No evidence of oropharyngeal candidiasis. Neck supple, trachea midline.  NODES:  No cervical or supraclavicular lymphadenopathy palpated.  BREAST EXAM:  Right breast is unremarkable. Right axilla is benign. There is a palpable mass in the upper outer quadrant of the left breast, easily palpated and firm, measuring approximately 3-3.5 cm. There no obvious skin changes. No nipple inversion. There is a palpable lymph node in the left axilla, approximately 2 cm in diameter and movable. LUNGS:  Clear to  auscultation bilaterally.  No wheezes or rhonchi HEART:  Regular rate and rhythm. No murmur  ABDOMEN:  Soft, nontender. No organomegaly or masses palpated. Positive bowel sounds.  MSK:  No focal spinal tenderness to palpation. Full range of motion bilaterally in the upper extremities. EXTREMITIES:  No peripheral edema.  No lymphedema in the left upper extremity. SKIN: No  visible rashes. No nail dyscrasia. No excessive ecchymoses. No petechiae. Skin is warm and dry. NEURO:  Nonfocal. Well oriented.  Appropriate affect.    LAB RESULTS:   Lab Results  Component Value Date   WBC 1.1* 11/06/2013   NEUTROABS 0.2* 11/06/2013   HGB 10.6* 11/06/2013   HCT 33.4* 11/06/2013   MCV 80.7 11/06/2013   PLT 187 11/06/2013      Chemistry      Component Value Date/Time   NA 136 11/06/2013 1510   NA 140 10/21/2013 1158   K 3.6 11/06/2013 1510   K 3.9 10/21/2013 1158   CL 105 10/21/2013 1158   CO2 19* 11/06/2013 1510   CO2 21 10/21/2013 1158   BUN 10.1 11/06/2013 1510   BUN 16 10/21/2013 1158   CREATININE 0.8 11/06/2013 1510   CREATININE 0.95 10/21/2013 1158      Component Value Date/Time   CALCIUM 9.2 11/06/2013 1510   CALCIUM 9.9 10/21/2013 1158   ALKPHOS 81 11/06/2013 1510   ALKPHOS 81 11/27/2012 0900   AST 14 11/06/2013 1510   AST 22 11/27/2012 0900   ALT 21 11/06/2013 1510   ALT 17 11/27/2012 0900   BILITOT 0.24 11/06/2013 1510   BILITOT 0.1* 11/27/2012 0900        STUDIES:  Echocardiogram on 10/29/2013 showed an ejection fraction of 55-60%.  US Soft Tissue Head/neck 10/31/2013   CLINICAL DATA:  Hypermetabolic left thyroid focus by PET-CT. History of breast cancer  EXAM: THYROID ULTRASOUND  TECHNIQUE: Ultrasound examination of the thyroid gland and adjacent soft tissues was performed.  COMPARISON:  10/20/2013 PET-CT  FINDINGS: Right thyroid lobe  Measurements: 4.9 x 1.5 x 1.5 cm. Mild heterogeneous gland echotexture. Right inferior echogenic solid thyroid nodule only measures 6 mm.  Left thyroid lobe  Measurements:  4.2 x 1.5 x 1.4 cm. Symmetric mild gland heterogeneity. Focal well-circumscribed 10 x 8 x 8 mm left upper pole thyroid nodule evident. This correlates with the PET-CT finding.  Isthmus  Thickness: 1.3 mm.  No nodules visualized.  Lymphadenopathy  None visualized.  IMPRESSION: 10 mm solid well-circumscribed left upper pole thyroid nodule correlates with the hypermetabolic PET-CT abnormality. Because of these findings, recommend ultrasound FNA biopsy.  Incidental right inferior 6 mm nodule.   Electronically Signed   By: Daryll Brod M.D.   On: 10/31/2013 12:17    ASSESSMENT: 43 y.o. [place] woman   (1)  status post left breast and left axillary lymph node biopsy 10/03/2013, both positive for a clinical T2 N2, stage IIIA invasive ductal carcinoma, grade 2, estrogen and progesterone receptor positive, HER-2 negative, with an MIB-1 of 20%.  (2) Treated with neoadjuvant chemotherapy to consist of doxorubicin and cyclophosphamide in dose dense fashion x4, with Neulasta support, to be followed by paclitaxel weekly x12  (2) definitive surgery to follow chemotherapy  (3) radiation to follow surgery  (4) tamoxifen to be started at the end of radiation  (5) continuing tobacco abuse: The patient has been strongly advised to discontinue smoking  (6) genetic testing sent 10/16/2013, results pending  PLAN: The majority of our 30 minute appointment today was spent reviewing Shelly Hall's side effects, reviewing her recent ultrasound and lab reports, discussing neutropenic precautions, reviewing her treatment plan, and coordinating care.  We reviewed neutropenic precautions today, and Shelly Hall is being started on Cipro prophylactically, 500 mg by mouth twice a day for 7 days. She also understands to contact us immediately with any temperatures of 100 or above. Of course her counts  will all be repeated again next week prior to cycle 2 which is scheduled for June 11.  I also encouraged her to keep herself very well  hydrated.  I am also starting Shelly Hall on acyclovir for her genital herpes recurrence. She will take 200 mg every 4 hours (5 times daily) for 5 days for the recurrence. She will then take 400 mg twice daily for maintenance/suppression until she completes all chemotherapy.  I encouraged Shelly Hall to continue following her glucose levels very closely. I think one of the reasons she was so tired earlier this week was from the "steroid crash" after completing her antinausea regimen. With her history of diabetes, I am not comfortable at this point adding a "steroid tail" with prednisone which we often do for a couple of days following the dexamethasone regimen. If her glucose appears to be well-controlled over the next couple of weeks, that is certainly a possibility.  Finally, I am putting in  a request to schedule an ultrasound-guided FNA biopsy of the left thyroid nodule noted on Shelly Hall's recent PET and thyroid ultrasound. Since she is undergoing chemotherapy, and already has developed chemotherapy-induced neutropenia, we will need to schedule the biopsy carefully at a time that she is least likely to be immunocompromised. Accordingly, I have requested that the biopsy be obtained either June 9 or June 23 which would be just prior to her next cycle, giving her counts plenty of time to recover after the Neulasta injection.  The above was reviewed in detail with the patient today, and she was also given all of this information in writing today. She voices her understanding and agreement with our plan and will call with any changes or problems prior to her next appointment which is scheduled for June 11.    Shelly Burrow, PA-C   11/06/2013 4:36 PM

## 2013-11-10 ENCOUNTER — Telehealth: Payer: Self-pay | Admitting: Adult Health

## 2013-11-10 NOTE — Telephone Encounter (Signed)
, °

## 2013-11-12 ENCOUNTER — Other Ambulatory Visit: Payer: Self-pay | Admitting: *Deleted

## 2013-11-12 DIAGNOSIS — C50412 Malignant neoplasm of upper-outer quadrant of left female breast: Secondary | ICD-10-CM

## 2013-11-13 ENCOUNTER — Other Ambulatory Visit: Payer: No Typology Code available for payment source

## 2013-11-13 ENCOUNTER — Ambulatory Visit (HOSPITAL_BASED_OUTPATIENT_CLINIC_OR_DEPARTMENT_OTHER): Payer: No Typology Code available for payment source | Admitting: Hematology and Oncology

## 2013-11-13 ENCOUNTER — Ambulatory Visit (HOSPITAL_BASED_OUTPATIENT_CLINIC_OR_DEPARTMENT_OTHER): Payer: No Typology Code available for payment source

## 2013-11-13 ENCOUNTER — Ambulatory Visit: Payer: No Typology Code available for payment source | Admitting: Adult Health

## 2013-11-13 ENCOUNTER — Other Ambulatory Visit (HOSPITAL_BASED_OUTPATIENT_CLINIC_OR_DEPARTMENT_OTHER): Payer: No Typology Code available for payment source

## 2013-11-13 VITALS — BP 114/78 | HR 90 | Temp 99.2°F | Resp 18 | Ht 64.0 in | Wt 175.1 lb

## 2013-11-13 DIAGNOSIS — C773 Secondary and unspecified malignant neoplasm of axilla and upper limb lymph nodes: Secondary | ICD-10-CM

## 2013-11-13 DIAGNOSIS — C50412 Malignant neoplasm of upper-outer quadrant of left female breast: Secondary | ICD-10-CM

## 2013-11-13 DIAGNOSIS — Z170 Estrogen receptor positive status [ER+]: Secondary | ICD-10-CM

## 2013-11-13 DIAGNOSIS — C50419 Malignant neoplasm of upper-outer quadrant of unspecified female breast: Secondary | ICD-10-CM

## 2013-11-13 DIAGNOSIS — E079 Disorder of thyroid, unspecified: Secondary | ICD-10-CM

## 2013-11-13 DIAGNOSIS — Z5111 Encounter for antineoplastic chemotherapy: Secondary | ICD-10-CM

## 2013-11-13 LAB — COMPREHENSIVE METABOLIC PANEL (CC13)
ALT: 16 U/L (ref 0–55)
ANION GAP: 11 meq/L (ref 3–11)
AST: 13 U/L (ref 5–34)
Albumin: 3.4 g/dL — ABNORMAL LOW (ref 3.5–5.0)
Alkaline Phosphatase: 80 U/L (ref 40–150)
BUN: 15.3 mg/dL (ref 7.0–26.0)
CO2: 23 meq/L (ref 22–29)
Calcium: 9.3 mg/dL (ref 8.4–10.4)
Chloride: 103 mEq/L (ref 98–109)
Creatinine: 1.1 mg/dL (ref 0.6–1.1)
Glucose: 148 mg/dl — ABNORMAL HIGH (ref 70–140)
Potassium: 3.4 mEq/L — ABNORMAL LOW (ref 3.5–5.1)
Sodium: 137 mEq/L (ref 136–145)
Total Protein: 7.7 g/dL (ref 6.4–8.3)

## 2013-11-13 LAB — CBC WITH DIFFERENTIAL/PLATELET
BASO%: 0.2 % (ref 0.0–2.0)
Basophils Absolute: 0 10*3/uL (ref 0.0–0.1)
EOS ABS: 0 10*3/uL (ref 0.0–0.5)
EOS%: 0.5 % (ref 0.0–7.0)
HCT: 33.1 % — ABNORMAL LOW (ref 34.8–46.6)
HGB: 10.7 g/dL — ABNORMAL LOW (ref 11.6–15.9)
LYMPH%: 10 % — ABNORMAL LOW (ref 14.0–49.7)
MCH: 26.3 pg (ref 25.1–34.0)
MCHC: 32.4 g/dL (ref 31.5–36.0)
MCV: 81.3 fL (ref 79.5–101.0)
MONO#: 0.7 10*3/uL (ref 0.1–0.9)
MONO%: 7.7 % (ref 0.0–14.0)
NEUT%: 81.6 % — ABNORMAL HIGH (ref 38.4–76.8)
NEUTROS ABS: 7.5 10*3/uL — AB (ref 1.5–6.5)
Platelets: 267 10*3/uL (ref 145–400)
RBC: 4.07 10*6/uL (ref 3.70–5.45)
RDW: 26.1 % — AB (ref 11.2–14.5)
WBC: 9.2 10*3/uL (ref 3.9–10.3)
lymph#: 0.9 10*3/uL (ref 0.9–3.3)

## 2013-11-13 MED ORDER — SODIUM CHLORIDE 0.9 % IV SOLN
Freq: Once | INTRAVENOUS | Status: AC
  Administered 2013-11-13: 11:00:00 via INTRAVENOUS

## 2013-11-13 MED ORDER — PALONOSETRON HCL INJECTION 0.25 MG/5ML
INTRAVENOUS | Status: AC
  Filled 2013-11-13: qty 5

## 2013-11-13 MED ORDER — SODIUM CHLORIDE 0.9 % IV SOLN
150.0000 mg | Freq: Once | INTRAVENOUS | Status: AC
  Administered 2013-11-13: 150 mg via INTRAVENOUS
  Filled 2013-11-13: qty 5

## 2013-11-13 MED ORDER — DEXAMETHASONE SODIUM PHOSPHATE 20 MG/5ML IJ SOLN
12.0000 mg | Freq: Once | INTRAMUSCULAR | Status: AC
  Administered 2013-11-13: 12 mg via INTRAVENOUS

## 2013-11-13 MED ORDER — DOXORUBICIN HCL CHEMO IV INJECTION 2 MG/ML
60.0000 mg/m2 | Freq: Once | INTRAVENOUS | Status: AC
  Administered 2013-11-13: 116 mg via INTRAVENOUS
  Filled 2013-11-13: qty 58

## 2013-11-13 MED ORDER — SODIUM CHLORIDE 0.9 % IV SOLN
600.0000 mg/m2 | Freq: Once | INTRAVENOUS | Status: AC
  Administered 2013-11-13: 1160 mg via INTRAVENOUS
  Filled 2013-11-13: qty 58

## 2013-11-13 MED ORDER — SODIUM CHLORIDE 0.9 % IJ SOLN
10.0000 mL | INTRAMUSCULAR | Status: DC | PRN
  Administered 2013-11-13: 10 mL
  Filled 2013-11-13: qty 10

## 2013-11-13 MED ORDER — POTASSIUM CHLORIDE CRYS ER 20 MEQ PO TBCR: 10.0000 meq | EXTENDED_RELEASE_TABLET | Freq: Every day | ORAL | Status: DC

## 2013-11-13 MED ORDER — HEPARIN SOD (PORK) LOCK FLUSH 100 UNIT/ML IV SOLN
500.0000 [IU] | Freq: Once | INTRAVENOUS | Status: AC | PRN
  Administered 2013-11-13: 500 [IU]
  Filled 2013-11-13: qty 5

## 2013-11-13 MED ORDER — DEXAMETHASONE SODIUM PHOSPHATE 20 MG/5ML IJ SOLN
INTRAMUSCULAR | Status: AC
  Filled 2013-11-13: qty 5

## 2013-11-13 MED ORDER — PALONOSETRON HCL INJECTION 0.25 MG/5ML
0.2500 mg | Freq: Once | INTRAVENOUS | Status: AC
  Administered 2013-11-13: 0.25 mg via INTRAVENOUS

## 2013-11-13 NOTE — Progress Notes (Signed)
Shelly Hall  [phone] Fax:([phone]) [phone]     ID: Shelly Hall OB: 09-Nov-1970  MR#: [id number]  DGL#:[id number]  PCP: No PCP Per Patient GYN:  [name] SU: [name] OTHER MD: Thea Silversmith  CHIEF COMPLAINT:  Left Breast Cancer/Neoadjuvant Chemotherapy  BREAST CANCER HISTORY: Shelly Hall palpated a mass in her left breast mid-April 2015 and immediately brought it to Dr. Benjie Karvonen' attention. She was set up for bilateral screening mammography with tomography at [facility] hospital 09/19/2013. The possible distortion in the left breast and a nearby group of calcifications was felt to warrant further evaluation, and on 09/30/2013 she underwent unilateral left diagnostic mammography and ultrasonography. There was an irregular mass in the outer left breast associated with microcalcifications. 4 cm anterior to this there was another cluster of pleomorphic calcifications spanning 7 mm. He had more anteriorly there was an irregular mass associated with other calcifications. In total the abnormalities in the left breast spent approximately 10 cm, and is segmental distribution. On exam there was a palpable firm mass at the 2:30 o'clock position of the left breast 10 cm from the nipple. There was palpable adenopathy in the inferior left axilla. Ultrasound confirmed an irregular hypoechoic mass in the left breast measuring 4.6 cm. In addition, a 1.4 cm oval slightly irregular mass was noted at the 3:00 position and yet another mass measured 9 mm in the same area. Ultrasound of the left axilla showed a dominant 3.6 cm lymph node.  Biopsy of 2 of the left breast masses and the suspicious left breast lymph node on 10/03/2013 showed ([id number]) an invasive ductal carcinoma, grade 2, estrogen receptor 90% positive with strong staining intensity, progesterone receptor 40% positive but moderate staining intensity, with an MIB-1 of 20% and no HER-2 amplification. (Because all 3 biopsies  were morphologically identical, only one prognostic panel was sent).  On 10/10/2013 the patient underwent bilateral breast MRI. This showed numerous enhancing masses in the left breast, the largest measuring 3.5 cm, and the aggregate measuring 12.7 cm. There were numerous enlarged left axillary lymph nodes, at least 5 of which were enlarged, both at levels 1 and 2. The largest measured 2.3 cm. The right breast was negative.  The patient's subsequent history is as detailed below.   INTERVAL HISTORY: Shelly Hall is a 43 years old female is here for initiation of cycle 2 dose dense AC Chemotherapy for her locally advanced left breast cancer.  She has tolerated cycle 1 chemotherapy with the minimal side effects of fatigue. Her nausea is controlled with the medications. She says on 5th  day of her Neulasta therapy she started having muscle aches and she could not get up from the bed. She says loratadine helped her much. Her vaginal herpes  is resolved and on  suppressive therapy with acyclovir. She has completed 7 days of ciprofloxacin  She is drinking enough fluids and her appetite is getting better.  REVIEW OF SYSTEMS: A detailed 10 point review systems is been assessed and the pertinent symptoms were mentioned in interval history. She denies any fever, chills, shortness of breath, chest pain, palpitations, blood in the stool or blood in the urine.  constipation is controlled with Colace and prune juice. Her nausea is controlled with the medications. Denies any headache blurred vision. Denies any weight loss  PAST MEDICAL HISTORY: Past Medical History  Diagnosis Date  . Diabetes mellitus   . Hypertension   . Sarcoidosis   . Anxiety   . Depression   .  Anemia   . Complication of anesthesia     makes pt. restless and unable to be still  . Cancer     breast cancer    PAST SURGICAL HISTORY: Past Surgical History  Procedure Laterality Date  . Tubal ligation  2001  . Endobronchial ultrasound  Bilateral 12/02/2012    Procedure: ENDOBRONCHIAL ULTRASOUND;  Surgeon: Collene Gobble, MD;  Location: [facility] ENDOSCOPY;  Service: Cardiopulmonary;  Laterality: Bilateral;  . Breast surgery Left     breast biopsy times 3  . Portacath placement N/A 10/22/2013    Procedure: INSERTION PORT-A-CATH;  Surgeon: [name], MD;  Location: [facility] OR;  Service: General;  Laterality: N/A;    FAMILY HISTORY Family History  Problem Relation Age of Onset  . Cancer Paternal Aunt     "bone cancer"; deceased 66s  . Cancer Paternal Uncle     stomach cancer; deceased 87s  . Cancer Paternal Aunt     unk. primary; currently 24s  . Prostate cancer Maternal Grandfather 37  The patient's parents are living, in fair mid to late 71s. The patient had one brother, no sisters. There is no history of breast or ovarian cancer in the family to her knowledge.   GYNECOLOGIC HISTORY:   (Reviewed 11/06/2013)  menarche age 57, first live birth at 47. She is GX P2. She was still having regular periods at the time of the start of her chemotherapy in May 2015.  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 She is single and lives alone, although her mother is currently staying with her. Her son Shelly Hall lives in [place] and works at [employer]. Son Shelly Hall lives in [place] where he works at a [employer]. The patient has 1 grandchild on the way. She is a Psychologist, forensic.    ADVANCED DIRECTIVES: not in place   HEALTH MAINTENANCE: (Updated 11/06/2013) History  Substance Use Topics  . Smoking status: Former Smoker -- 1.00 packs/day for 23 years    Types: Cigarettes    Quit date: 10/19/2013  . Smokeless tobacco: Never Used  . Alcohol Use: 1.0 oz/week    2 drink(s) per week     Comment: occ. x2 dks per week     Colonoscopy: Never  PAP: Not on file  Bone density: Not on file  Lipid panel:  Not on file  Allergies  Allergen Reactions  . Nyquil Multi-Symptom  [Pseudoeph-Doxylamine-Dm-Apap] Itching    Skin peels on hands, and feet    Current Outpatient Prescriptions  Medication Sig Dispense Refill  . acyclovir (ZOVIRAX) 400 MG tablet Take 1 tablet (400 mg total) by mouth 2 (two) times daily. Beginning 11/12/2013  60 tablet  4  . albuterol (PROVENTIL HFA;VENTOLIN HFA) 108 (90 BASE) MCG/ACT inhaler Inhale 2 puffs into the lungs every 6 (six) hours as needed for wheezing or shortness of breath.      Marland Kitchen atenolol-chlorthalidone (TENORETIC) 50-25 MG per tablet Take 1 tablet by mouth daily.  30 tablet  1  . ciprofloxacin (CIPRO) 500 MG tablet Take 1 tablet (500 mg total) by mouth 2 (two) times daily. X 7 days  14 tablet  3  . dexamethasone (DECADRON) 4 MG tablet Take 2 tablets by mouth once a day on the day after chemotherapy and then take 2 tablets two times a day for 2 days. Take with food.  30 tablet  1  . docusate sodium (COLACE) 100 MG capsule Take 100 mg by mouth daily.      Marland Kitchen  ferrous sulfate 325 (65 FE) MG tablet Take 325 mg by mouth 2 (two) times daily with a meal.      . lidocaine-prilocaine (EMLA) cream Apply 1 application topically as needed. Apply over port area 1-2 hours before chemo and cover with plastic wrap  30 g  0  . Loratadine 10 MG CAPS Take 1 capsule (10 mg total) by mouth as directed.  30 each  3  . LORazepam (ATIVAN) 0.5 MG tablet Take 1 tablet (0.5 mg total) by mouth at bedtime as needed (Nausea or vomiting).  30 tablet  0  . metFORMIN (GLUCOPHAGE) 500 MG tablet Take 1 tablet (500 mg total) by mouth 2 (two) times daily with a meal.  60 tablet  1  . Multiple Vitamin (MULTIVITAMIN WITH MINERALS) TABS tablet Take 1 tablet by mouth daily.      . prochlorperazine (COMPAZINE) 10 MG tablet Take 1 tablet (10 mg total) by mouth every 6 (six) hours as needed (Nausea or vomiting).  30 tablet  1  . potassium chloride SA (K-DUR,KLOR-CON) 20 MEQ tablet Take 0.5 tablets (10 mEq total) by mouth daily.  20 tablet  2   No current facility-administered  medications for this visit.    OBJECTIVE: Middle-aged [place] American woman who appears tired but is in no acute distress Filed Vitals:   11/13/13 0935  BP: 114/78  Pulse: 90  Temp: 99.2 F (37.3 C)  Resp: 18     Body mass index is 30.04 kg/(m^2).    ECOG FS:1 - Symptomatic but completely ambulatory Filed Weights   11/13/13 0935  Weight: 175 lb 1.6 oz (79.425 kg)   Physical Exam: HEENT:  Sclerae anicteric.  Oropharynx clear, pink, and moist. No ulcerations or mucositis. No evidence of oropharyngeal candidiasis. Neck supple, trachea midline.  NODES:  No cervical or supraclavicular lymphadenopathy palpated.  BREAST EXAM:  Right breast is unremarkable. Right axilla is benign. There is a palpable mass in the upper outer quadrant of the left breast, easily palpated and firm, measuring approximately 3-3.5 cm. There no obvious skin changes. No nipple inversion. There is a palpable lymph node in the left axilla, approximately 2 cm in diameter and movable. LUNGS:  Clear to auscultation bilaterally.  No wheezes or rhonchi HEART:  Regular rate and rhythm. No murmur  ABDOMEN:  Soft, nontender. No organomegaly or masses palpated. Positive bowel sounds.  MSK:  No focal spinal tenderness to palpation. Full range of motion bilaterally in the upper extremities. EXTREMITIES:  No peripheral edema.  No lymphedema in the left upper extremity. SKIN: No visible rashes. No nail dyscrasia. No excessive ecchymoses. No petechiae. Skin is warm and dry. NEURO:  Nonfocal. Well oriented.  Appropriate affect.    LAB RESULTS:   Lab Results  Component Value Date   WBC 9.2 11/13/2013   NEUTROABS 7.5* 11/13/2013   HGB 10.7* 11/13/2013   HCT 33.1* 11/13/2013   MCV 81.3 11/13/2013   PLT 267 11/13/2013      Chemistry      Component Value Date/Time   NA 137 11/13/2013 0924   NA 140 10/21/2013 1158   K 3.4* 11/13/2013 0924   K 3.9 10/21/2013 1158   CL 105 10/21/2013 1158   CO2 23 11/13/2013 0924   CO2 21 10/21/2013  1158   BUN 15.3 11/13/2013 0924   BUN 16 10/21/2013 1158   CREATININE 1.1 11/13/2013 0924   CREATININE 0.95 10/21/2013 1158      Component Value Date/Time   CALCIUM 9.3 11/13/2013  5573   CALCIUM 9.9 10/21/2013 1158   ALKPHOS 80 11/13/2013 0924   ALKPHOS 81 11/27/2012 0900   AST 13 11/13/2013 0924   AST 22 11/27/2012 0900   ALT 16 11/13/2013 0924   ALT 17 11/27/2012 0900   BILITOT <0.20 11/13/2013 0924   BILITOT 0.1* 11/27/2012 0900        STUDIES:  Echocardiogram on 10/29/2013 showed an ejection fraction of 55-60%.  US Soft Tissue Head/neck 10/31/2013   CLINICAL DATA:  Hypermetabolic left thyroid focus by PET-CT. History of breast cancer  EXAM: THYROID ULTRASOUND  TECHNIQUE: Ultrasound examination of the thyroid gland and adjacent soft tissues was performed.  COMPARISON:  10/20/2013 PET-CT  FINDINGS: Right thyroid lobe  Measurements: 4.9 x 1.5 x 1.5 cm. Mild heterogeneous gland echotexture. Right inferior echogenic solid thyroid nodule only measures 6 mm.  Left thyroid lobe  Measurements: 4.2 x 1.5 x 1.4 cm. Symmetric mild gland heterogeneity. Focal well-circumscribed 10 x 8 x 8 mm left upper pole thyroid nodule evident. This correlates with the PET-CT finding.  Isthmus  Thickness: 1.3 mm.  No nodules visualized.  Lymphadenopathy  None visualized.  IMPRESSION: 10 mm solid well-circumscribed left upper pole thyroid nodule correlates with the hypermetabolic PET-CT abnormality. Because of these findings, recommend ultrasound FNA biopsy.  Incidental right inferior 6 mm nodule.   Electronically Signed   By: Daryll Brod M.D.   On: 10/31/2013 12:17    ASSESSMENT: 43 y.o. [place] woman   (1)  status post left breast and left axillary lymph node biopsy 10/03/2013, both positive for a clinical T2 N2, stage IIIA invasive ductal carcinoma, grade 2, estrogen and progesterone receptor positive, HER-2 negative, with an MIB-1 of 20%.  (2) Treated with neoadjuvant chemotherapy to consist of doxorubicin and  cyclophosphamide in dose dense fashion x4, with Neulasta support, to be followed by paclitaxel weekly x12  (2) definitive surgery to follow chemotherapy  (3) radiation to follow surgery  (4) tamoxifen to be started at the end of radiation  (5) continuing tobacco abuse: The patient has been strongly advised to discontinue smoking  (6) genetic testing sent 10/16/2013, results pending  (7) ultrasound of her thyroid on 10/31/2013 revealed a 10 mm solid well-circumscribed left upper pole thyroid nodule   PLAN: Shelly Hall tolerated cycle #1 chemotherapy with dose dense AC quite well . Her CBC is acceptable for treatment today. We will continue cycle #2 chemotherapy with dose dense AC. today followed by Neulasta growth factor support tomorrow.  I have asked the patient to continue taking loratidine starting from today #2 or 3 of Neulasta injection for 5 days. Continue nausea vomiting medications as needed.since the blood glucose levels is a concern with steroids and patient is clinically doing better , agree on not using steroids post chemotherapy.   I have discussed with the patient on the need of  ultrasound-guided FNA biopsy of the left thyroid nodule noted on Shelly Hall's recent PET and thyroid ultrasound. She understood that same and she would like to proceed with ultrasound-guided FNA biopsy of the left thyroid nodule which is scheduled on June 23 Follow up in 1 week for assessment of chemotoxicities.  CBC and differential and CMP will be done on the day of next visit    Wilmon Arms, MD   Medical oncology  11/13/2013 10:30 AM

## 2013-11-13 NOTE — Patient Instructions (Addendum)
[facility] Discharge Instructions for Patients Receiving Chemotherapy  --Dr. Earnest Conroy called you in a prescription for potassium 10 mEq x 3 days.  --Remember, your cream should be applied at least 1 hr, no more than 2 hrs prior to your port being accessed for your cream to be effective.   Today you received the following chemotherapy agents: Adriamycin, Cytoxan  To help prevent nausea and vomiting after your treatment, we encourage you to take your nausea medication: Compazine 10 mg every 6 hrs as needed.    If you develop nausea and vomiting that is not controlled by your nausea medication, call the clinic.   BELOW ARE SYMPTOMS THAT SHOULD BE REPORTED IMMEDIATELY:  *FEVER GREATER THAN 100.5 F  *CHILLS WITH OR WITHOUT FEVER  NAUSEA AND VOMITING THAT IS NOT CONTROLLED WITH YOUR NAUSEA MEDICATION  *UNUSUAL SHORTNESS OF BREATH  *UNUSUAL BRUISING OR BLEEDING  TENDERNESS IN MOUTH AND THROAT WITH OR WITHOUT PRESENCE OF ULCERS  *URINARY PROBLEMS  *BOWEL PROBLEMS  UNUSUAL RASH Items with * indicate a potential emergency and should be followed up as soon as possible.  Feel free to call the clinic you have any questions or concerns. The clinic phone number is [phone].

## 2013-11-14 ENCOUNTER — Ambulatory Visit (HOSPITAL_BASED_OUTPATIENT_CLINIC_OR_DEPARTMENT_OTHER): Payer: No Typology Code available for payment source

## 2013-11-14 VITALS — BP 111/69 | HR 70 | Temp 98.2°F

## 2013-11-14 DIAGNOSIS — C50412 Malignant neoplasm of upper-outer quadrant of left female breast: Secondary | ICD-10-CM

## 2013-11-14 DIAGNOSIS — C50419 Malignant neoplasm of upper-outer quadrant of unspecified female breast: Secondary | ICD-10-CM

## 2013-11-14 DIAGNOSIS — Z5189 Encounter for other specified aftercare: Secondary | ICD-10-CM

## 2013-11-14 MED ORDER — PEGFILGRASTIM INJECTION 6 MG/0.6ML
6.0000 mg | Freq: Once | SUBCUTANEOUS | Status: AC
  Administered 2013-11-14: 6 mg via SUBCUTANEOUS
  Filled 2013-11-14: qty 0.6

## 2013-11-16 ENCOUNTER — Other Ambulatory Visit: Payer: Self-pay | Admitting: Oncology

## 2013-11-17 ENCOUNTER — Encounter: Payer: Self-pay | Admitting: Oncology

## 2013-11-17 ENCOUNTER — Encounter (INDEPENDENT_AMBULATORY_CARE_PROVIDER_SITE_OTHER): Payer: No Typology Code available for payment source | Admitting: General Surgery

## 2013-11-17 ENCOUNTER — Encounter: Payer: Self-pay | Admitting: Genetic Counselor

## 2013-11-17 NOTE — Progress Notes (Signed)
Referring Physician: Lurline Del, MD   Ms. Pair was called today to discuss genetic test results. Please see the Genetics note from her visit on 10/16/13 for a detailed discussion of her personal and family history.  GENETIC TESTING: At the time of Ms. Kotlyar's visit, we recommended she pursue genetic testing of multiple genes on the BreastNext gene panel. This test, which included sequencing and deletion/duplication analysis of 17 genes, was performed at [facility]. Testing did not reveal any clearly pathogenic (harmful) mutation in any of these genes. The genes tested were ATM, BARD1, BRCA1, BRCA2, BRIP1, CDH1, CHEK2, MRE11A, MUTYH, NBN, NF1, PALB2, PTEN, RAD50, RAD51C, RAD51D, and TP53.  We discussed with Ms. Isidro that since the current test is not perfect, it is possible there may be a gene mutation that current testing cannot detect, but that chance is small. We also discussed that it is possible that a different genetic factor, which was not part of this testing or has not yet been discovered, is responsible for the cancer diagnoses in the family.      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 At this time, it is unknown if this variant is associated with increased cancer risk or if this is a normal finding, but most variants such as this get reclassified to being inconsequential. It should not be used to make medical management decisions. With time, we suspect the lab will determine the significance of this variant, if any. If we do learn more about it, we will try to contact Ms. Cassatt to discuss it further. However, it is important to stay in touch with [place] periodically and keep the address and phone number up to date.  CANCER SCREENING: This result suggests that Ms. Schnepf's cancer was most likely not due to an inherited predisposition. Most cancers happen by chance and this negative test, along with details of her  family history, suggests that her cancer falls into this category. We, therefore, recommended she continue to follow the cancer screening guidelines provided by her physician.   FAMILY MEMBERS: Ms. Lobdell has no daughters or sisters. Women in the family should have a yearly mammogram, a yearly clinical breast exam, and perform monthly breast self-exams. A gynecologic exam is recommended yearly. Colon cancer screening is recommended to begin by age 72 in both men and women.   Family members should not get tested for the NBN VUS. If anyone in the family develops cancer, they should contact a genetic counselor to discuss whether clinical testing is appropriate.  Lastly, we discussed with Ms. Reinertsen that cancer genetics is a rapidly advancing field and it is possible that new genetic tests will be appropriate for her in the future. We encouraged her to remain in contact with [place] on an annual basis so we can update her personal and family histories, and let her know of advances in cancer genetics that may benefit the family. Our contact number was provided. Ms. Kontz questions were answered to her satisfaction today, and she knows she is welcome to call anytime with additional questions.    Steele Berg, MS, [place] Certified Genetic Counseor phone: [phone] [email or web address]

## 2013-11-19 ENCOUNTER — Other Ambulatory Visit: Payer: Self-pay | Admitting: Oncology

## 2013-11-20 ENCOUNTER — Other Ambulatory Visit (HOSPITAL_BASED_OUTPATIENT_CLINIC_OR_DEPARTMENT_OTHER): Payer: No Typology Code available for payment source

## 2013-11-20 ENCOUNTER — Telehealth: Payer: Self-pay | Admitting: Oncology

## 2013-11-20 ENCOUNTER — Ambulatory Visit (HOSPITAL_BASED_OUTPATIENT_CLINIC_OR_DEPARTMENT_OTHER): Payer: No Typology Code available for payment source | Admitting: Nurse Practitioner

## 2013-11-20 ENCOUNTER — Other Ambulatory Visit: Payer: Self-pay | Admitting: Physician Assistant

## 2013-11-20 VITALS — BP 124/76 | HR 79 | Temp 98.8°F | Resp 20 | Ht 64.0 in | Wt 176.5 lb

## 2013-11-20 DIAGNOSIS — C773 Secondary and unspecified malignant neoplasm of axilla and upper limb lymph nodes: Secondary | ICD-10-CM

## 2013-11-20 DIAGNOSIS — E876 Hypokalemia: Secondary | ICD-10-CM

## 2013-11-20 DIAGNOSIS — E041 Nontoxic single thyroid nodule: Secondary | ICD-10-CM

## 2013-11-20 DIAGNOSIS — D649 Anemia, unspecified: Secondary | ICD-10-CM

## 2013-11-20 DIAGNOSIS — N898 Other specified noninflammatory disorders of vagina: Secondary | ICD-10-CM

## 2013-11-20 DIAGNOSIS — C50919 Malignant neoplasm of unspecified site of unspecified female breast: Secondary | ICD-10-CM

## 2013-11-20 DIAGNOSIS — C50419 Malignant neoplasm of upper-outer quadrant of unspecified female breast: Secondary | ICD-10-CM

## 2013-11-20 DIAGNOSIS — C50412 Malignant neoplasm of upper-outer quadrant of left female breast: Secondary | ICD-10-CM

## 2013-11-20 DIAGNOSIS — D702 Other drug-induced agranulocytosis: Secondary | ICD-10-CM

## 2013-11-20 DIAGNOSIS — Z170 Estrogen receptor positive status [ER+]: Secondary | ICD-10-CM

## 2013-11-20 DIAGNOSIS — K219 Gastro-esophageal reflux disease without esophagitis: Secondary | ICD-10-CM

## 2013-11-20 LAB — CBC WITH DIFFERENTIAL/PLATELET
BASO%: 3.4 % — AB (ref 0.0–2.0)
Basophils Absolute: 0 10*3/uL (ref 0.0–0.1)
EOS%: 1.1 % (ref 0.0–7.0)
Eosinophils Absolute: 0 10*3/uL (ref 0.0–0.5)
HCT: 28.4 % — ABNORMAL LOW (ref 34.8–46.6)
HGB: 9.2 g/dL — ABNORMAL LOW (ref 11.6–15.9)
LYMPH#: 0.6 10*3/uL — AB (ref 0.9–3.3)
LYMPH%: 69.3 % — AB (ref 14.0–49.7)
MCH: 26.2 pg (ref 25.1–34.0)
MCHC: 32.4 g/dL (ref 31.5–36.0)
MCV: 80.9 fL (ref 79.5–101.0)
MONO#: 0.1 10*3/uL (ref 0.1–0.9)
MONO%: 15.9 % — ABNORMAL HIGH (ref 0.0–14.0)
NEUT#: 0.1 10*3/uL — CL (ref 1.5–6.5)
NEUT%: 10.3 % — ABNORMAL LOW (ref 38.4–76.8)
PLATELETS: 231 10*3/uL (ref 145–400)
RBC: 3.51 10*6/uL — AB (ref 3.70–5.45)
RDW: 22.1 % — ABNORMAL HIGH (ref 11.2–14.5)
WBC: 0.9 10*3/uL — AB (ref 3.9–10.3)

## 2013-11-20 LAB — COMPREHENSIVE METABOLIC PANEL (CC13)
ALT: 10 U/L (ref 0–55)
ANION GAP: 10 meq/L (ref 3–11)
AST: 9 U/L (ref 5–34)
Albumin: 3.3 g/dL — ABNORMAL LOW (ref 3.5–5.0)
Alkaline Phosphatase: 75 U/L (ref 40–150)
BUN: 12.1 mg/dL (ref 7.0–26.0)
CO2: 24 mEq/L (ref 22–29)
Calcium: 9.4 mg/dL (ref 8.4–10.4)
Chloride: 103 mEq/L (ref 98–109)
Creatinine: 0.9 mg/dL (ref 0.6–1.1)
Glucose: 97 mg/dl (ref 70–140)
Potassium: 3.4 mEq/L — ABNORMAL LOW (ref 3.5–5.1)
SODIUM: 136 meq/L (ref 136–145)
Total Protein: 7 g/dL (ref 6.4–8.3)

## 2013-11-20 MED ORDER — PANTOPRAZOLE SODIUM 40 MG PO TBEC: 40.0000 mg | DELAYED_RELEASE_TABLET | Freq: Every day | ORAL | Status: DC

## 2013-11-20 NOTE — Progress Notes (Signed)
[facility] OFFICE PROGRESS NOTE   Diagnosis:  Breast cancer currently receiving neoadjuvant chemotherapy.  INTERVAL HISTORY:   Ms. Bednarski returns as scheduled. She completed cycle 2 dose dense Adriamycin/Cytoxan on 11/13/2013. She received Neulasta support. She has very mild nausea periodically. No vomiting. No mouth sores. Bowels moving regularly. No diarrhea or constipation. She has an occasional cough and mild dyspnea on exertion. She notes that she fatigues easily. No fevers or chills. No hematuria or dysuria. She reports good fluid intake. She reports fairly continual vaginal bleeding over the past month which waxes and wanes in volume. Her main complaint today is "acid reflux". She has been taking Zantac with no improvement.  Objective:  Vital signs in last 24 hours:  Blood pressure 124/76, pulse 79, temperature 98.8 F (37.1 C), temperature source Oral, resp. rate 20, height 5' 4" (1.626 m), weight 176 lb 8 oz (80.06 kg), last menstrual period 09/29/2013, SpO2 100.00%.    HEENT: No thrush or ulcerations. Mucous membranes appear moist. Lymphatics: No palpable cervical, supraclavicular or right axillary lymph nodes. Palpable mobile lymph node in the left axilla measuring approximately 2 cm. Resp: Lungs clear. No wheezes or rales. Cardio: Regular cardiac rhythm. GI: Abdomen is soft and nontender. No organomegaly. No mass. Vascular: No leg edema. Calves nontender.  Skin: No rash. Half centimeter subcutaneous cystic-like lesion in the right axilla. Breasts: Approximate 4 cm palpable mass left lateral breast/upper outer quadrant.    Lab Results:  Lab Results  Component Value Date   WBC 0.9* 11/20/2013   HGB 9.2* 11/20/2013   HCT 28.4* 11/20/2013   MCV 80.9 11/20/2013   PLT 231 11/20/2013   NEUTROABS 0.1* 11/20/2013    Imaging:  No results found.  Medications: I have reviewed the patient's current medications.  Assessment/Plan: 1. Clinical stage IIIa left  breast cancer, T2 N2, invasive ductal carcinoma, grade 2, ER and PR positive, HER-2 negative with MIB-1 of 20%.   Cycle 1 neoadjuvant dose dense Adriamycin/Cytoxan 10/30/2013.   Cycle 2 neoadjuvant dose dense Adriamycin/Cytoxan 11/13/2013. 2. Neutropenia secondary to chemotherapy. 3. Hypermetabolic focus left thyroid gland on PET 10/16/2013.   Thyroid ultrasound 10/31/2013 with a 10 mm solid well-circumscribed left upper pole thyroid nodule correlating with the hypermetabolic PET abnormality.   Biopsy scheduled 11/25/2013. 4. Acid reflux. No improvement with Zantac. 5. Vaginal bleeding. Persistent. 6. Hypokalemia. 7. Anemia. She had a microcytic anemia with a hemoglobin of 9.5 prior to beginning chemotherapy. Question iron deficiency with chemotherapy and vaginal blood loss contributing factors. She will try to take her oral iron more consistently.   Disposition: Ms. Lady has completed 2 cycles of dose dense AC. She is significantly neutropenic. We reviewed neutropenic precautions. She will begin Cipro 500 mg twice daily.  She is scheduled for a biopsy of the left thyroid nodule on  11/25/2013. We will obtain a CBC prior to proceeding with the biopsy to ensure her counts have recovered to an adequate level.  For the acid reflux not responding to Zantac she was prescribed Protonix 40 mg daily.  She will contact her gynecologist regarding the persistent vaginal bleeding.  She has mild hypokalemia. She will begin Kdur 20 mEq daily. She is scheduled for a chemistry panel in one week.  Ms. Hennings will return for a followup visit with Micah Flesher PA-C on 11/27/2013 prior to proceeding with cycle 3 Adriamycin/Cytoxan. She understands to contact the office in the interim with any problems. We specifically discussed fever, shaking chills, other signs of infection.  30 minutes were spent face-to-face at today's visit with the majority of that time involved in counseling/coordination of  care.    Thomas, Lisa ANP/GNP-BC   11/20/2013  4:30 PM         

## 2013-11-20 NOTE — Telephone Encounter (Signed)
Gave pt appt for lab, md and chemo for June and July 2015

## 2013-11-20 NOTE — Patient Instructions (Signed)
Resume potassium 20 meq daily Begin cipro 500 mg twice daily  Begin protonix 40 mg daily (prescription sent to pharmacy) Repeat labs on 11/25/13 prior to thyroid biopsy

## 2013-11-25 ENCOUNTER — Other Ambulatory Visit (HOSPITAL_COMMUNITY)
Admission: RE | Admit: 2013-11-25 | Discharge: 2013-11-25 | Disposition: A | Discharge status: Home / Self Care | Payer: No Typology Code available for payment source | Source: Ambulatory Visit | Attending: Interventional Radiology | Admitting: Interventional Radiology

## 2013-11-25 ENCOUNTER — Ambulatory Visit
Admission: RE | Admit: 2013-11-25 | Discharge: 2013-11-25 | Disposition: A | Discharge status: Home / Self Care | Payer: No Typology Code available for payment source | Source: Ambulatory Visit | Attending: Physician Assistant | Admitting: Physician Assistant

## 2013-11-25 ENCOUNTER — Other Ambulatory Visit (HOSPITAL_BASED_OUTPATIENT_CLINIC_OR_DEPARTMENT_OTHER): Payer: No Typology Code available for payment source

## 2013-11-25 DIAGNOSIS — C773 Secondary and unspecified malignant neoplasm of axilla and upper limb lymph nodes: Secondary | ICD-10-CM

## 2013-11-25 DIAGNOSIS — C50419 Malignant neoplasm of upper-outer quadrant of unspecified female breast: Secondary | ICD-10-CM | POA: Insufficient documentation

## 2013-11-25 DIAGNOSIS — E041 Nontoxic single thyroid nodule: Secondary | ICD-10-CM | POA: Insufficient documentation

## 2013-11-25 DIAGNOSIS — C50412 Malignant neoplasm of upper-outer quadrant of left female breast: Secondary | ICD-10-CM

## 2013-11-25 DIAGNOSIS — C50919 Malignant neoplasm of unspecified site of unspecified female breast: Secondary | ICD-10-CM

## 2013-11-25 LAB — CBC WITH DIFFERENTIAL/PLATELET
BASO%: 0.4 % (ref 0.0–2.0)
Basophils Absolute: 0 10*3/uL (ref 0.0–0.1)
EOS%: 0.2 % (ref 0.0–7.0)
Eosinophils Absolute: 0 10*3/uL (ref 0.0–0.5)
HCT: 30 % — ABNORMAL LOW (ref 34.8–46.6)
HGB: 9.4 g/dL — ABNORMAL LOW (ref 11.6–15.9)
LYMPH%: 7.1 % — ABNORMAL LOW (ref 14.0–49.7)
MCH: 26.2 pg (ref 25.1–34.0)
MCHC: 31.5 g/dL (ref 31.5–36.0)
MCV: 83.4 fL (ref 79.5–101.0)
MONO#: 0.7 10*3/uL (ref 0.1–0.9)
MONO%: 6 % (ref 0.0–14.0)
NEUT#: 9.6 10*3/uL — ABNORMAL HIGH (ref 1.5–6.5)
NEUT%: 86.3 % — ABNORMAL HIGH (ref 38.4–76.8)
Platelets: 229 10*3/uL (ref 145–400)
RBC: 3.6 10*6/uL — ABNORMAL LOW (ref 3.70–5.45)
RDW: 25.1 % — ABNORMAL HIGH (ref 11.2–14.5)
WBC: 11.2 10*3/uL — ABNORMAL HIGH (ref 3.9–10.3)
lymph#: 0.8 10*3/uL — ABNORMAL LOW (ref 0.9–3.3)

## 2013-11-26 ENCOUNTER — Other Ambulatory Visit: Payer: Self-pay | Admitting: Physician Assistant

## 2013-11-26 DIAGNOSIS — C50412 Malignant neoplasm of upper-outer quadrant of left female breast: Secondary | ICD-10-CM

## 2013-11-27 ENCOUNTER — Encounter: Payer: Self-pay | Admitting: Physician Assistant

## 2013-11-27 ENCOUNTER — Ambulatory Visit (HOSPITAL_BASED_OUTPATIENT_CLINIC_OR_DEPARTMENT_OTHER): Payer: No Typology Code available for payment source

## 2013-11-27 ENCOUNTER — Telehealth: Payer: Self-pay | Admitting: Oncology

## 2013-11-27 ENCOUNTER — Other Ambulatory Visit (HOSPITAL_BASED_OUTPATIENT_CLINIC_OR_DEPARTMENT_OTHER): Payer: No Typology Code available for payment source

## 2013-11-27 ENCOUNTER — Ambulatory Visit (HOSPITAL_BASED_OUTPATIENT_CLINIC_OR_DEPARTMENT_OTHER): Payer: No Typology Code available for payment source | Admitting: Physician Assistant

## 2013-11-27 VITALS — BP 111/78 | HR 97 | Temp 98.2°F | Resp 18 | Ht 64.0 in | Wt 176.1 lb

## 2013-11-27 DIAGNOSIS — E041 Nontoxic single thyroid nodule: Secondary | ICD-10-CM

## 2013-11-27 DIAGNOSIS — C50419 Malignant neoplasm of upper-outer quadrant of unspecified female breast: Secondary | ICD-10-CM

## 2013-11-27 DIAGNOSIS — C50412 Malignant neoplasm of upper-outer quadrant of left female breast: Secondary | ICD-10-CM

## 2013-11-27 DIAGNOSIS — C50919 Malignant neoplasm of unspecified site of unspecified female breast: Secondary | ICD-10-CM

## 2013-11-27 DIAGNOSIS — E119 Type 2 diabetes mellitus without complications: Secondary | ICD-10-CM

## 2013-11-27 DIAGNOSIS — D649 Anemia, unspecified: Secondary | ICD-10-CM

## 2013-11-27 DIAGNOSIS — M898X9 Other specified disorders of bone, unspecified site: Secondary | ICD-10-CM

## 2013-11-27 DIAGNOSIS — E0789 Other specified disorders of thyroid: Secondary | ICD-10-CM

## 2013-11-27 DIAGNOSIS — R0781 Pleurodynia: Secondary | ICD-10-CM | POA: Insufficient documentation

## 2013-11-27 DIAGNOSIS — C773 Secondary and unspecified malignant neoplasm of axilla and upper limb lymph nodes: Secondary | ICD-10-CM

## 2013-11-27 DIAGNOSIS — Z452 Encounter for adjustment and management of vascular access device: Secondary | ICD-10-CM

## 2013-11-27 DIAGNOSIS — K219 Gastro-esophageal reflux disease without esophagitis: Secondary | ICD-10-CM | POA: Insufficient documentation

## 2013-11-27 DIAGNOSIS — Z5111 Encounter for antineoplastic chemotherapy: Secondary | ICD-10-CM

## 2013-11-27 LAB — CBC WITH DIFFERENTIAL/PLATELET
BASO%: 0.5 % (ref 0.0–2.0)
Basophils Absolute: 0.1 10*3/uL (ref 0.0–0.1)
EOS%: 0.4 % (ref 0.0–7.0)
Eosinophils Absolute: 0 10*3/uL (ref 0.0–0.5)
HEMATOCRIT: 29.4 % — AB (ref 34.8–46.6)
HEMOGLOBIN: 9.7 g/dL — AB (ref 11.6–15.9)
LYMPH%: 10 % — ABNORMAL LOW (ref 14.0–49.7)
MCH: 27.3 pg (ref 25.1–34.0)
MCHC: 32.8 g/dL (ref 31.5–36.0)
MCV: 83 fL (ref 79.5–101.0)
MONO#: 0.8 10*3/uL (ref 0.1–0.9)
MONO%: 7.6 % (ref 0.0–14.0)
NEUT%: 81.5 % — AB (ref 38.4–76.8)
NEUTROS ABS: 8.6 10*3/uL — AB (ref 1.5–6.5)
Platelets: 289 10*3/uL (ref 145–400)
RBC: 3.54 10*6/uL — ABNORMAL LOW (ref 3.70–5.45)
RDW: 24.4 % — AB (ref 11.2–14.5)
WBC: 10.5 10*3/uL — AB (ref 3.9–10.3)
lymph#: 1 10*3/uL (ref 0.9–3.3)

## 2013-11-27 LAB — COMPREHENSIVE METABOLIC PANEL (CC13)
ALK PHOS: 79 U/L (ref 40–150)
ALT: 15 U/L (ref 0–55)
AST: 14 U/L (ref 5–34)
Albumin: 3.6 g/dL (ref 3.5–5.0)
Anion Gap: 12 mEq/L — ABNORMAL HIGH (ref 3–11)
BUN: 14.8 mg/dL (ref 7.0–26.0)
CALCIUM: 9.4 mg/dL (ref 8.4–10.4)
CHLORIDE: 104 meq/L (ref 98–109)
CO2: 20 mEq/L — ABNORMAL LOW (ref 22–29)
CREATININE: 1 mg/dL (ref 0.6–1.1)
Glucose: 217 mg/dl — ABNORMAL HIGH (ref 70–140)
Potassium: 3.5 mEq/L (ref 3.5–5.1)
Sodium: 136 mEq/L (ref 136–145)
Total Bilirubin: <0.2 mg/dL (ref 0.20–1.20)
Total Protein: 7.8 g/dL (ref 6.4–8.3)

## 2013-11-27 MED ORDER — SODIUM CHLORIDE 0.9 % IV SOLN
600.0000 mg/m2 | Freq: Once | INTRAVENOUS | Status: AC
  Administered 2013-11-27: 1160 mg via INTRAVENOUS
  Filled 2013-11-27: qty 58

## 2013-11-27 MED ORDER — ALTEPLASE 2 MG IJ SOLR
2.0000 mg | Freq: Once | INTRAMUSCULAR | Status: AC | PRN
  Administered 2013-11-27: 2 mg
  Filled 2013-11-27: qty 2

## 2013-11-27 MED ORDER — DEXAMETHASONE SODIUM PHOSPHATE 20 MG/5ML IJ SOLN
12.0000 mg | Freq: Once | INTRAMUSCULAR | Status: AC
  Administered 2013-11-27: 12 mg via INTRAVENOUS

## 2013-11-27 MED ORDER — DEXAMETHASONE SODIUM PHOSPHATE 20 MG/5ML IJ SOLN
INTRAMUSCULAR | Status: AC
  Filled 2013-11-27: qty 5

## 2013-11-27 MED ORDER — DOXORUBICIN HCL CHEMO IV INJECTION 2 MG/ML
60.0000 mg/m2 | Freq: Once | INTRAVENOUS | Status: AC
  Administered 2013-11-27: 116 mg via INTRAVENOUS
  Filled 2013-11-27: qty 58

## 2013-11-27 MED ORDER — SODIUM CHLORIDE 0.9 % IV SOLN
Freq: Once | INTRAVENOUS | Status: AC
  Administered 2013-11-27: 12:00:00 via INTRAVENOUS

## 2013-11-27 MED ORDER — PALONOSETRON HCL INJECTION 0.25 MG/5ML
INTRAVENOUS | Status: AC
  Filled 2013-11-27: qty 5

## 2013-11-27 MED ORDER — HEPARIN SOD (PORK) LOCK FLUSH 100 UNIT/ML IV SOLN
500.0000 [IU] | Freq: Once | INTRAVENOUS | Status: AC | PRN
  Administered 2013-11-27: 500 [IU]
  Filled 2013-11-27: qty 5

## 2013-11-27 MED ORDER — FOSAPREPITANT DIMEGLUMINE INJECTION 150 MG
150.0000 mg | Freq: Once | INTRAVENOUS | Status: AC
  Administered 2013-11-27: 150 mg via INTRAVENOUS
  Filled 2013-11-27: qty 5

## 2013-11-27 MED ORDER — PALONOSETRON HCL INJECTION 0.25 MG/5ML
0.2500 mg | Freq: Once | INTRAVENOUS | Status: AC
Start: 2013-11-27 — End: 2013-11-27
  Administered 2013-11-27: 0.25 mg via INTRAVENOUS

## 2013-11-27 MED ORDER — PANTOPRAZOLE SODIUM 40 MG PO TBEC: 40.0000 mg | DELAYED_RELEASE_TABLET | Freq: Every day | ORAL | Status: DC

## 2013-11-27 MED ORDER — SODIUM CHLORIDE 0.9 % IJ SOLN
10.0000 mL | INTRAMUSCULAR | Status: DC | PRN
  Administered 2013-11-27: 10 mL
  Filled 2013-11-27: qty 10

## 2013-11-27 NOTE — Telephone Encounter (Signed)
gv pt appt schedule for june/july. °

## 2013-11-27 NOTE — Progress Notes (Signed)
[place]  Telephone:([phone]) [phone] [phone]     ID: Shelly Hall OB: [birth year]  MR#: [id number]  CWU#:[id number]  PCP: No PCP Per Patient GYN:  [name] SU: [name] OTHER MD: Thea Silversmith  CHIEF COMPLAINT:  Left Breast Cancer/Neoadjuvant Chemotherapy  BREAST CANCER HISTORY: Shelly Hall palpated a mass in her left breast mid-April 2015 and immediately brought it to Dr. Benjie Karvonen' attention. She was set up for bilateral screening mammography with tomography at [facility] 09/19/2013. The possible distortion in the left breast and a nearby group of calcifications was felt to warrant further evaluation, and on 09/30/2013 she underwent unilateral left diagnostic mammography and ultrasonography. There was an irregular mass in the outer left breast associated with microcalcifications. 4 cm anterior to this there was another cluster of pleomorphic calcifications spanning 7 mm. He had more anteriorly there was an irregular mass associated with other calcifications. In total the abnormalities in the left breast spent approximately 10 cm, and is segmental distribution. On exam there was a palpable firm mass at the 2:30 o'clock position of the left breast 10 cm from the nipple. There was palpable adenopathy in the inferior left axilla. Ultrasound confirmed an irregular hypoechoic mass in the left breast measuring 4.6 cm. In addition, a 1.4 cm oval slightly irregular mass was noted at the 3:00 position and yet another mass measured 9 mm in the same area. Ultrasound of the left axilla showed a dominant 3.6 cm lymph node.  Biopsy of 2 of the left breast masses and the suspicious left breast lymph node on 10/03/2013 showed ([id number]) an invasive ductal carcinoma, grade 2, estrogen receptor 90% positive with strong staining intensity, progesterone receptor 40% positive but moderate staining intensity, with an MIB-1 of 20% and no HER-2 amplification. (Because all 3 biopsies  were morphologically identical, only one prognostic panel was sent).  On 10/10/2013 the patient underwent bilateral breast MRI. This showed numerous enhancing masses in the left breast, the largest measuring 3.5 cm, and the aggregate measuring 12.7 cm. There were numerous enlarged left axillary lymph nodes, at least 5 of which were enlarged, both at levels 1 and 2. The largest measured 2.3 cm. The right breast was negative.  The patient's subsequent history is as detailed below.   INTERVAL HISTORY: Shelly Hall returns alone today for followup of her locally advanced left breast cancer for which she is receiving neoadjuvant chemotherapy. She is scheduled for day 1 cycle 3 of 4 planned dose dense cycles of doxorubicin/cyclophosphamide. She receives Neulasta on day 2 for granulocyte support.  Shelly Hall is tolerating treatment reasonably well. She has had more bony pain following the Neulasta injection with cycle 2. She completed her Cipro which she was taking prophylactically for a febrile neutropenia. Her counts have recovered nicely, and she denies any fevers or chills.   Interval history is notable for the fact that Shelly Hall underwent a fine-needle aspiration biopsy of the thyroid on 11/25/2013. She tolerated the procedure well with no complications. The pathology report showed a follicular epithelium in a predominantly microfollicular pattern, suspicious for follicular neoplasm, Bethesda class IV. This report was also reviewed with Dr. Earnest Conroy today and Dr. Jana Hakim absence.   REVIEW OF SYSTEMS: Shelly Hall denies any night sweats or hot flashes. She has moderate fatigue and weakness. She's had no rashes or skin changes and denies any abnormal bruising or bleeding. Her appetite is fairly good, and she's had no nausea, emesis, or change in bowel or bladder habits. Protonix has helped significantly with  her reflux, and she requests a refill on that medication today. She has a runny nose. She denies any cough,  phlegm production, increased shortness of breath, peripheral swelling, chest pain, or palpitations. She's had no abnormal headaches or dizziness. She does get blurred vision when taking the dexamethasone. The pain in her arms, legs, and back has improved, but was significant following the Neulasta injection. She continues to have some joint pain. She also experiences anxiety and depression but denies suicidal ideation.   A detailed review of systems is otherwise stable and noncontributory.    PAST MEDICAL HISTORY: Past Medical History  Diagnosis Date  . Diabetes mellitus   . Hypertension   . Sarcoidosis   . Anxiety   . Depression   . Anemia   . Complication of anesthesia     makes pt. restless and unable to be still  . Cancer     breast cancer    PAST SURGICAL HISTORY: Past Surgical History  Procedure Laterality Date  . Tubal ligation  2001  . Endobronchial ultrasound Bilateral 12/02/2012    Procedure: ENDOBRONCHIAL ULTRASOUND;  Surgeon: Collene Gobble, MD;  Location: [facility];  Service: Cardiopulmonary;  Laterality: Bilateral;  . Breast surgery Left     breast biopsy times 3  . Portacath placement N/A 10/22/2013    Procedure: INSERTION PORT-A-CATH;  Surgeon: [name], MD;  Location: [facility] OR;  Service: General;  Laterality: N/A;    FAMILY HISTORY Family History  Problem Relation Age of Onset  . Cancer Paternal Aunt     "bone cancer"; deceased 79s  . Cancer Paternal Uncle     stomach cancer; deceased 4s  . Cancer Paternal Aunt     unk. primary; currently 50s  . Prostate cancer Maternal Grandfather 48  The patient's parents are living, in fair mid to late 45s. The patient had one brother, no sisters. There is no history of breast or ovarian cancer in the family to her knowledge.   GYNECOLOGIC HISTORY:   (Reviewed 11/27/2013)  menarche age 71, first live birth at 74. She is GX P2. She was still having regular periods at the time of the start of her chemotherapy in May  2015.  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 She is single and lives alone, although her mother is currently staying with her. Her son Shelly Hall lives in [place] and works at [employer]. Son Shelly Hall lives in [place] where he works at a [employer]. The patient has 1 grandchild on the way. She is a Psychologist, forensic.    ADVANCED DIRECTIVES: not in place   HEALTH MAINTENANCE: (Updated 11/27/2013) History  Substance Use Topics  . Smoking status: Former Smoker -- 1.00 packs/day for 23 years    Types: Cigarettes    Quit date: 10/19/2013  . Smokeless tobacco: Never Used  . Alcohol Use: 1.0 oz/week    2 drink(s) per week     Comment: occ. x2 dks per week     Colonoscopy: Never  PAP: Not on file  Bone density: Not on file  Lipid panel:  Not on file  Allergies  Allergen Reactions  . Nyquil Multi-Symptom [Pseudoeph-Doxylamine-Dm-Apap] Itching    Skin peels on hands, and feet    Current Outpatient Prescriptions  Medication Sig Dispense Refill  . acyclovir (ZOVIRAX) 400 MG tablet Take 1 tablet (400 mg total) by mouth 2 (two) times daily. Beginning 11/12/2013  60 tablet  4  . atenolol-chlorthalidone (TENORETIC) 50-25  MG per tablet Take 1 tablet by mouth daily.  30 tablet  1  . docusate sodium (COLACE) 100 MG capsule Take 100 mg by mouth daily.      . ferrous sulfate 325 (65 FE) MG tablet Take 325 mg by mouth 2 (two) times daily with a meal.      . lidocaine-prilocaine (EMLA) cream Apply 1 application topically as needed. Apply over port area 1-2 hours before chemo and cover with plastic wrap  30 g  0  . metFORMIN (GLUCOPHAGE) 500 MG tablet Take 1 tablet (500 mg total) by mouth 2 (two) times daily with a meal.  60 tablet  1  . Multiple Vitamin (MULTIVITAMIN WITH MINERALS) TABS tablet Take 1 tablet by mouth daily.      . pantoprazole (PROTONIX) 40 MG tablet Take 1 tablet (40 mg total) by mouth daily.  30 tablet  5  .  albuterol (PROVENTIL HFA;VENTOLIN HFA) 108 (90 BASE) MCG/ACT inhaler Inhale 2 puffs into the lungs every 6 (six) hours as needed for wheezing or shortness of breath.      . dexamethasone (DECADRON) 4 MG tablet Take 2 tablets by mouth once a day on the day after chemotherapy and then take 2 tablets two times a day for 2 days. Take with food.  30 tablet  1  . Loratadine 10 MG CAPS Take 1 capsule (10 mg total) by mouth as directed.  30 each  3  . LORazepam (ATIVAN) 0.5 MG tablet Take 1 tablet (0.5 mg total) by mouth at bedtime as needed (Nausea or vomiting).  30 tablet  0  . potassium chloride SA (K-DUR,KLOR-CON) 20 MEQ tablet Take 0.5 tablets (10 mEq total) by mouth daily.  20 tablet  2  . prochlorperazine (COMPAZINE) 10 MG tablet Take 1 tablet (10 mg total) by mouth every 6 (six) hours as needed (Nausea or vomiting).  30 tablet  1   No current facility-administered medications for this visit.   Facility-Administered Medications Ordered in Other Visits  Medication Dose Route Frequency Provider Last Rate Last Dose  . heparin lock flush 100 unit/mL  500 Units Intracatheter Once PRN Chauncey Cruel, MD      . sodium chloride 0.9 % injection 10 mL  10 mL Intracatheter PRN Chauncey Cruel, MD        OBJECTIVE: Middle-aged [place] American woman who appears tired but is in no acute distress Filed Vitals:   11/27/13 0948  BP: 111/78  Pulse: 97  Temp: 98.2 F (36.8 C)  Resp: 18     Body mass index is 30.21 kg/(m^2).    ECOG FS:1 - Symptomatic but completely ambulatory Filed Weights   11/27/13 0948  Weight: 176 lb 1.6 oz (79.878 kg)   Physical Exam: HEENT:  Sclerae anicteric.  There are some areas of hyperpigmentation on the tongue. Oropharynx clear, pink, and moist. No evidence of mucositis or oropharyngeal candidiasis. Neck supple, trachea midline.  NODES:  No cervical or supraclavicular lymphadenopathy palpated.  BREAST EXAM:  Right breast is unremarkable. Right axilla is benign. There is  a palpable mass in the upper outer quadrant of the left breast, easily palpated, measuring approximately 3 cm. There are no obvious skin changes. No nipple inversion. There is a palpable lymph node in the left axilla, approximately 2 cm in diameter and movable. LUNGS:  Clear to auscultation bilaterally with good excursion.  No wheezes or rhonchi HEART:  Regular rate and rhythm. No murmur appreciated. ABDOMEN:  Soft, nontender. No organomegaly  or masses palpated. Positive bowel sounds.  MSK:  No focal spinal tenderness to palpation. Full range of motion bilaterally in the upper extremities. EXTREMITIES:  No peripheral edema.  No lymphedema in the left upper extremity. SKIN: No visible rashes. Mild hyperpigmentation of the skin of the hands, and also the fingernails bilaterally. No tenderness in the nailbeds, and no drainage or evidence of infection. No excessive ecchymoses. No petechiae. Skin is warm and dry. NEURO:  Nonfocal. Well oriented.  Appropriate affect.    LAB RESULTS:   Lab Results  Component Value Date   WBC 10.5* 11/27/2013   NEUTROABS 8.6* 11/27/2013   HGB 9.7* 11/27/2013   HCT 29.4* 11/27/2013   MCV 83.0 11/27/2013   PLT 289 11/27/2013      Chemistry      Component Value Date/Time   NA 136 11/27/2013 0944   NA 140 10/21/2013 1158   K 3.5 11/27/2013 0944   K 3.9 10/21/2013 1158   CL 105 10/21/2013 1158   CO2 20* 11/27/2013 0944   CO2 21 10/21/2013 1158   BUN 14.8 11/27/2013 0944   BUN 16 10/21/2013 1158   CREATININE 1.0 11/27/2013 0944   CREATININE 0.95 10/21/2013 1158      Component Value Date/Time   CALCIUM 9.4 11/27/2013 0944   CALCIUM 9.9 10/21/2013 1158   ALKPHOS 79 11/27/2013 0944   ALKPHOS 81 11/27/2012 0900   AST 14 11/27/2013 0944   AST 22 11/27/2012 0900   ALT 15 11/27/2013 0944   ALT 17 11/27/2012 0900   BILITOT <0.20 11/27/2013 0944   BILITOT 0.1* 11/27/2012 0900        STUDIES:  Echocardiogram on 10/29/2013 showed an ejection fraction of 55-60%.  [place] Soft  Tissue Head/neck 10/31/2013   CLINICAL DATA:  Hypermetabolic left thyroid focus by PET-CT. History of breast cancer  EXAM: THYROID ULTRASOUND  TECHNIQUE: Ultrasound examination of the thyroid gland and adjacent soft tissues was performed.  COMPARISON:  10/20/2013 PET-CT  FINDINGS: Right thyroid lobe  Measurements: 4.9 x 1.5 x 1.5 cm. Mild heterogeneous gland echotexture. Right inferior echogenic solid thyroid nodule only measures 6 mm.  Left thyroid lobe  Measurements: 4.2 x 1.5 x 1.4 cm. Symmetric mild gland heterogeneity. Focal well-circumscribed 10 x 8 x 8 mm left upper pole thyroid nodule evident. This correlates with the PET-CT finding.  Isthmus  Thickness: 1.3 mm.  No nodules visualized.  Lymphadenopathy  None visualized.  IMPRESSION: 10 mm solid well-circumscribed left upper pole thyroid nodule correlates with the hypermetabolic PET-CT abnormality. Because of these findings, recommend ultrasound FNA biopsy.  Incidental right inferior 6 mm nodule.   Electronically Signed   By: Daryll Brod M.D.   On: 10/31/2013 12:17    ASSESSMENT: 43 y.o. [place] woman   (1)  status post left breast and left axillary lymph node biopsy 10/03/2013, both positive for a clinical T2 N2, stage IIIA invasive ductal carcinoma, grade 2, estrogen and progesterone receptor positive, HER-2 negative, with an MIB-1 of 20%.  (2) Treated with neoadjuvant chemotherapy to consist of doxorubicin and cyclophosphamide in dose dense fashion x4, with Neulasta support, to be followed by paclitaxel weekly x12  (2) definitive surgery to follow chemotherapy  (3) radiation to follow surgery  (4) tamoxifen to be started at the end of radiation  (5) continuing tobacco abuse: The patient has been strongly advised to discontinue smoking  (6) genetic testing sent 10/16/2013, negative for mutation  PLAN: The majority of our 25 minute appointment today was spent reviewing  Itzae's concerns, reviewing her recent pathology and lab  reports, discussing her plan for treatment, and coordinating care.  Joelynn will proceed to treatment today as scheduled for her third neoadjuvant cycle of doxorubicin/cyclophosphamide, and will return tomorrow for her Neulasta injection on day 2. She scheduled to be seen next week for labs and physical exam on July 1 for assessment of chemotoxicity. The plan is for her to receive her fourth and final dose of doxorubicin/cyclophosphamide on July 9, after which we will need to repeat her breast MRI to assess response. She will then be seen by Dr. Jana Hakim as well as her surgeon, Dr. Barry Dienes, to review those results.  With regards to her recent thyroid biopsy, we reviewed those results together today.  The plan is to proceed with treatment for her breast cancer, and address further treatment of the thyroid once the breast cancer therapy has been completed. As noted above, Dr. Earnest Conroy also reviewed the biopsy results and Dr. Jana Hakim absence, and she concurs with this plan.  She will definitely need a referral to an endocrinologist, and may also need to proceed with thyroidectomy.  The above was reviewed in detail with the patient today. She voices her understanding and agreement with our plan and will call with any changes or problems prior to her next appointment.   Marcile Fuquay, PA-C   11/27/2013 1:55 PM

## 2013-11-27 NOTE — Patient Instructions (Signed)
[facility] Discharge Instructions for Patients Receiving Chemotherapy  Today you received the following chemotherapy agents Adriamycin and Cytoxan.  To help prevent nausea and vomiting after your treatment, we encourage you to take your nausea medication as prescribed.   If you develop nausea and vomiting that is not controlled by your nausea medication, call the clinic.   BELOW ARE SYMPTOMS THAT SHOULD BE REPORTED IMMEDIATELY:  *FEVER GREATER THAN 100.5 F  *CHILLS WITH OR WITHOUT FEVER  NAUSEA AND VOMITING THAT IS NOT CONTROLLED WITH YOUR NAUSEA MEDICATION  *UNUSUAL SHORTNESS OF BREATH  *UNUSUAL BRUISING OR BLEEDING  TENDERNESS IN MOUTH AND THROAT WITH OR WITHOUT PRESENCE OF ULCERS  *URINARY PROBLEMS  *BOWEL PROBLEMS  UNUSUAL RASH Items with * indicate a potential emergency and should be followed up as soon as possible.  Feel free to call the clinic you have any questions or concerns. The clinic phone number is [phone].    

## 2013-11-28 ENCOUNTER — Ambulatory Visit (HOSPITAL_BASED_OUTPATIENT_CLINIC_OR_DEPARTMENT_OTHER): Payer: No Typology Code available for payment source

## 2013-11-28 VITALS — BP 131/72 | HR 73 | Temp 98.5°F

## 2013-11-28 DIAGNOSIS — C773 Secondary and unspecified malignant neoplasm of axilla and upper limb lymph nodes: Secondary | ICD-10-CM

## 2013-11-28 DIAGNOSIS — C50919 Malignant neoplasm of unspecified site of unspecified female breast: Secondary | ICD-10-CM

## 2013-11-28 DIAGNOSIS — C50419 Malignant neoplasm of upper-outer quadrant of unspecified female breast: Secondary | ICD-10-CM

## 2013-11-28 DIAGNOSIS — Z5189 Encounter for other specified aftercare: Secondary | ICD-10-CM

## 2013-11-28 DIAGNOSIS — C50412 Malignant neoplasm of upper-outer quadrant of left female breast: Secondary | ICD-10-CM

## 2013-11-28 MED ORDER — PEGFILGRASTIM INJECTION 6 MG/0.6ML
6.0000 mg | Freq: Once | SUBCUTANEOUS | Status: AC
  Administered 2013-11-28: 6 mg via SUBCUTANEOUS
  Filled 2013-11-28: qty 0.6

## 2013-12-03 ENCOUNTER — Ambulatory Visit (HOSPITAL_BASED_OUTPATIENT_CLINIC_OR_DEPARTMENT_OTHER): Payer: No Typology Code available for payment source | Admitting: Adult Health

## 2013-12-03 ENCOUNTER — Encounter: Payer: Self-pay | Admitting: Adult Health

## 2013-12-03 ENCOUNTER — Encounter: Payer: Self-pay | Admitting: *Deleted

## 2013-12-03 ENCOUNTER — Other Ambulatory Visit (HOSPITAL_BASED_OUTPATIENT_CLINIC_OR_DEPARTMENT_OTHER): Payer: Medicaid Other

## 2013-12-03 VITALS — BP 108/74 | HR 82 | Temp 98.4°F | Resp 18 | Ht 64.0 in | Wt 175.6 lb

## 2013-12-03 DIAGNOSIS — M898X9 Other specified disorders of bone, unspecified site: Secondary | ICD-10-CM

## 2013-12-03 DIAGNOSIS — C50919 Malignant neoplasm of unspecified site of unspecified female breast: Secondary | ICD-10-CM

## 2013-12-03 DIAGNOSIS — C50419 Malignant neoplasm of upper-outer quadrant of unspecified female breast: Secondary | ICD-10-CM

## 2013-12-03 DIAGNOSIS — C50412 Malignant neoplasm of upper-outer quadrant of left female breast: Secondary | ICD-10-CM

## 2013-12-03 DIAGNOSIS — D702 Other drug-induced agranulocytosis: Secondary | ICD-10-CM

## 2013-12-03 DIAGNOSIS — M949 Disorder of cartilage, unspecified: Secondary | ICD-10-CM

## 2013-12-03 DIAGNOSIS — M899 Disorder of bone, unspecified: Secondary | ICD-10-CM

## 2013-12-03 DIAGNOSIS — Z170 Estrogen receptor positive status [ER+]: Secondary | ICD-10-CM

## 2013-12-03 LAB — CBC WITH DIFFERENTIAL/PLATELET
BASO%: 4.2 % — ABNORMAL HIGH (ref 0.0–2.0)
Basophils Absolute: 0 10*3/uL (ref 0.0–0.1)
EOS%: 1.4 % (ref 0.0–7.0)
Eosinophils Absolute: 0 10*3/uL (ref 0.0–0.5)
HCT: 26.7 % — ABNORMAL LOW (ref 34.8–46.6)
HGB: 8.6 g/dL — ABNORMAL LOW (ref 11.6–15.9)
LYMPH#: 0.4 10*3/uL — AB (ref 0.9–3.3)
LYMPH%: 49.3 % (ref 14.0–49.7)
MCH: 26.8 pg (ref 25.1–34.0)
MCHC: 32.2 g/dL (ref 31.5–36.0)
MCV: 83.2 fL (ref 79.5–101.0)
MONO#: 0.1 10*3/uL (ref 0.1–0.9)
MONO%: 7 % (ref 0.0–14.0)
NEUT#: 0.3 10*3/uL — CL (ref 1.5–6.5)
NEUT%: 38.1 % — ABNORMAL LOW (ref 38.4–76.8)
Platelets: 273 10*3/uL (ref 145–400)
RBC: 3.21 10*6/uL — ABNORMAL LOW (ref 3.70–5.45)
RDW: 21.6 % — AB (ref 11.2–14.5)
WBC: 0.7 10*3/uL — CL (ref 3.9–10.3)
nRBC: 0 % (ref 0–0)

## 2013-12-03 LAB — COMPREHENSIVE METABOLIC PANEL (CC13)
ALK PHOS: 90 U/L (ref 40–150)
ALT: 11 U/L (ref 0–55)
AST: 12 U/L (ref 5–34)
Albumin: 3.8 g/dL (ref 3.5–5.0)
Anion Gap: 8 mEq/L (ref 3–11)
BUN: 17.9 mg/dL (ref 7.0–26.0)
CALCIUM: 9.5 mg/dL (ref 8.4–10.4)
CO2: 26 mEq/L (ref 22–29)
CREATININE: 1.1 mg/dL (ref 0.6–1.1)
Chloride: 104 mEq/L (ref 98–109)
Glucose: 127 mg/dl (ref 70–140)
Potassium: 4.2 mEq/L (ref 3.5–5.1)
Sodium: 138 mEq/L (ref 136–145)
Total Bilirubin: 0.26 mg/dL (ref 0.20–1.20)
Total Protein: 7.6 g/dL (ref 6.4–8.3)

## 2013-12-03 MED ORDER — OXYCODONE HCL 5 MG PO TABS: 5.0000 mg | ORAL_TABLET | ORAL | Status: DC | PRN

## 2013-12-03 MED ORDER — CIPROFLOXACIN HCL 500 MG PO TABS
500.0000 mg | ORAL_TABLET | Freq: Two times a day (BID) | ORAL | Status: DC
Start: 2013-12-03 — End: 2013-12-23

## 2013-12-03 NOTE — Progress Notes (Signed)
Completed applications for [employer] and [employer].  Patient updated CSW on disability and food stamps status.  Patient plans to follow up with CSW as needed.

## 2013-12-03 NOTE — Patient Instructions (Signed)
Patient Neutropenia Instruction Sheet  Diagnosis: Breast Cancer      Treating Physician: Lurline Del MD  Treatment: 1. Type of chemotherapy: Doxorubicin/Cyclophosphamide 2. Date of last treatment: 11/27/13  Last Blood Counts: Lab Results  Component Value Date   WBC 0.7* 12/03/2013   HGB 8.6* 12/03/2013   HCT 26.7* 12/03/2013   MCV 83.2 12/03/2013   PLT 273 12/03/2013  ANC 300     Prophylactic Antibiotics: Cipro 500 mg by mouth twice a day Instructions: 1. Monitor temperature and call if fever  greater than 100.5, chills, shaking chills (rigors) 2. Call Physician on-call at [phone] 3. Give him/her symptoms and list of medications that you are taking and your last blood count.  Ciprofloxacin tablets What is this medicine? CIPROFLOXACIN (sip roe FLOX a sin) is a quinolone antibiotic. It is used to treat certain kinds of bacterial infections. It will not work for colds, flu, or other viral infections. This medicine may be used for other purposes; ask your health care provider or pharmacist if you have questions. COMMON BRAND NAME(S): Cipro What should I tell my health care provider before I take this medicine? They need to know if you have any of these conditions: -bone problems -cerebral disease -joint problems -irregular heartbeat -kidney disease -liver disease -myasthenia gravis -seizure disorder -tendon problems -an unusual or allergic reaction to ciprofloxacin, other antibiotics or medicines, foods, dyes, or preservatives -pregnant or trying to get pregnant -breast-feeding How should I use this medicine? Take this medicine by mouth with a glass of water. Follow the directions on the prescription label. Take your medicine at regular intervals. Do not take your medicine more often than directed. Take all of your medicine as directed even if you think your are better. Do not skip doses or stop your medicine early. You can take this medicine with food or on an empty  stomach. It can be taken with a meal that contains dairy or calcium, but do not take it alone with a dairy product, like milk or yogurt or calcium-fortified juice. A special MedGuide will be given to you by the pharmacist with each prescription and refill. Be sure to read this information carefully each time. Talk to your pediatrician regarding the use of this medicine in children. Special care may be needed. Overdosage: If you think you have taken too much of this medicine contact a poison control center or emergency room at once. NOTE: This medicine is only for you. Do not share this medicine with others. What if I miss a dose? If you miss a dose, take it as soon as you can. If it is almost time for your next dose, take only that dose. Do not take double or extra doses. What may interact with this medicine? Do not take this medicine with any of the following medications: -cisapride -droperidol -terfenadine -tizanidine This medicine may also interact with the following medications: -antacids -birth control pills -caffeine -cyclosporin -didanosine (ddI) buffered tablets or powder -medicines for diabetes -medicines for inflammation like ibuprofen, naproxen -methotrexate -multivitamins -omeprazole -phenytoin -probenecid -sucralfate -theophylline -warfarin This list may not describe all possible interactions. Give your health care provider a list of all the medicines, herbs, non-prescription drugs, or dietary supplements you use. Also tell them if you smoke, drink alcohol, or use illegal drugs. Some items may interact with your medicine. What should I watch for while using this medicine? Tell your doctor or health care professional if your symptoms do not improve. Do not treat diarrhea with over the  counter products. Contact your doctor if you have diarrhea that lasts more than 2 days or if it is severe and watery. You may get drowsy or dizzy. Do not drive, use machinery, or do anything  that needs mental alertness until you know how this medicine affects you. Do not stand or sit up quickly, especially if you are an older patient. This reduces the risk of dizzy or fainting spells. This medicine can make you more sensitive to the sun. Keep out of the sun. If you cannot avoid being in the sun, wear protective clothing and use sunscreen. Do not use sun lamps or tanning beds/booths. Avoid antacids, aluminum, calcium, iron, magnesium, and zinc products for 6 hours before and 2 hours after taking a dose of this medicine. What side effects may I notice from receiving this medicine? Side effects that you should report to your doctor or health care professional as soon as possible: - allergic reactions like skin rash, itching or hives, swelling of the face, lips, or tongue - breathing problems - confusion, nightmares or hallucinations - feeling faint or lightheaded, falls - irregular heartbeat - joint, muscle or tendon pain or swelling - pain or trouble passing urine -persistent headache with or without blurred vision - redness, blistering, peeling or loosening of the skin, including inside the mouth - seizure - unusual pain, numbness, tingling, or weakness Side effects that usually do not require medical attention (report to your doctor or health care professional if they continue or are bothersome): - diarrhea - nausea or stomach upset - white patches or sores in the mouth This list may not describe all possible side effects. Call your doctor for medical advice about side effects. You may report side effects to FDA at 1-800-FDA-1088. Where should I keep my medicine? Keep out of the reach of children. Store at room temperature below 30 degrees C (86 degrees F). Keep container tightly closed. Throw away any unused medicine after the expiration date. NOTE: This sheet is a summary. It may not cover all possible information. If you have questions about this medicine, talk to  your doctor, pharmacist, or health care provider.  2015, Elsevier/Gold Standard. (2012-12-26 16:10:46)

## 2013-12-03 NOTE — Progress Notes (Signed)
[place]  [phone] [phone]     ID: Shelly Hall OB: [birth year]  MR#: [id number]  JGG#:[id number]  PCP: No PCP Per Patient GYN:  [name] SU: [name] OTHER MD: Thea Silversmith  CHIEF COMPLAINT:  Left Breast Cancer/Neoadjuvant Chemotherapy  BREAST CANCER HISTORY: Shelly Hall palpated a mass in her left breast mid-April 2015 and immediately brought it to Dr. Benjie Karvonen' attention. Shelly Hall was set up for bilateral screening mammography with tomography at [facility] 09/19/2013. The possible distortion in the left breast and a nearby group of calcifications was felt to warrant further evaluation, and on 09/30/2013 Shelly Hall underwent unilateral left diagnostic mammography and ultrasonography. There was an irregular mass in the outer left breast associated with microcalcifications. 4 cm anterior to this there was another cluster of pleomorphic calcifications spanning 7 mm. He had more anteriorly there was an irregular mass associated with other calcifications. In total the abnormalities in the left breast spent approximately 10 cm, and is segmental distribution. On exam there was a palpable firm mass at the 2:30 o'clock position of the left breast 10 cm from the nipple. There was palpable adenopathy in the inferior left axilla. Ultrasound confirmed an irregular hypoechoic mass in the left breast measuring 4.6 cm. In addition, a 1.4 cm oval slightly irregular mass was noted at the 3:00 position and yet another mass measured 9 mm in the same area. Ultrasound of the left axilla showed a dominant 3.6 cm lymph node.  Biopsy of 2 of the left breast masses and the suspicious left breast lymph node on 10/03/2013 showed ([id number]) an invasive ductal carcinoma, grade 2, estrogen receptor 90% positive with strong staining intensity, progesterone receptor 40% positive but moderate staining intensity, with an MIB-1 of 20% and no HER-2 amplification. (Because all 3 biopsies  were morphologically identical, only one prognostic panel was sent).  On 10/10/2013 the patient underwent bilateral breast MRI. This showed numerous enhancing masses in the left breast, the largest measuring 3.5 cm, and the aggregate measuring 12.7 cm. There were numerous enlarged left axillary lymph nodes, at least 5 of which were enlarged, both at levels 1 and 2. The largest measured 2.3 cm. The right breast was negative.  The patient's subsequent history is as detailed below.  INTERVAL HISTORY: Shelly Hall is doing moderately well today following her third cycle of neoadjuvant Doxorubicin and Cyclophosphamide.  Shelly Hall is currently cycle 3 day 8.  Shelly Hall is tired today.  Shelly Hall does have significant bone pain due to the Neulasta and requires Oxycodone to be able to move.  Otherwise, Shelly Hall denies fevers, chills, nausea, vomiting, constipation, diarrhea, numbness/tingling, skin changes, or any further concerns.   REVIEW OF SYSTEMS: A 10 point review of systems was conducted and is otherwise negative except for what is noted above.    PAST MEDICAL HISTORY: Past Medical History  Diagnosis Date  . Diabetes mellitus   . Hypertension   . Sarcoidosis   . Anxiety   . Depression   . Anemia   . Complication of anesthesia     makes pt. restless and unable to be still  . Cancer     breast cancer    PAST SURGICAL HISTORY: Past Surgical History  Procedure Laterality Date  . Tubal ligation  2001  . Endobronchial ultrasound Bilateral 12/02/2012    Procedure: ENDOBRONCHIAL ULTRASOUND;  Surgeon: Collene Gobble, MD;  Location: [facility] END[facility]COPY;  Service: Cardiopulmonary;  Laterality: Bilateral;  . Breast surgery Left     breast biopsy  times 3  . Portacath placement N/A 10/22/2013    Procedure: INSERTION PORT-A-CATH;  Surgeon: [name], MD;  Location: [facility] OR;  Service: General;  Laterality: N/A;    FAMILY HISTORY Family History  Problem Relation Age of Onset  . Cancer Paternal Aunt     "bone cancer"; deceased  42s  . Cancer Paternal Uncle     stomach cancer; deceased 25s  . Cancer Paternal Aunt     unk. primary; currently 69s  . Prostate cancer Maternal Grandfather 2  The patient's parents are living, in fair mid to late 93s. The patient had one brother, no sisters. There is no history of breast or ovarian cancer in the family to her knowledge.   GYNECOLOGIC HISTORY:   (Reviewed 11/27/2013)  menarche age 78, first live birth at 63. Shelly Hall is GX P2. Shelly Hall was still having regular periods at the time of the start of her chemotherapy in May 2015.  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 Shelly Hall is single and lives alone, although her mother is currently staying with her. Her son Mailen Newborn lives in [place] and works at [employer]. Son [name] lives in [place] where he works at a [employer]. The patient has 1 grandchild on the way. 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    ADVANCED DIRECTIVES: not in place   HEALTH MAINTENANCE: (Updated 11/27/2013) History  Substance Use Topics  . Smoking status: Former Smoker -- 1.00 packs/day for 23 years    Types: Cigarettes    Quit date: 10/19/2013  . Smokeless tobacco: Never Used  . Alcohol Use: 1.0 oz/week    2 drink(s) per week     Comment: occ. x2 dks per week     Colonoscopy: Never  PAP: Not on file  Bone density: Not on file  Lipid panel:  Not on file  Allergies  Allergen Reactions  . Nyquil Multi-Symptom [Pseudoeph-Doxylamine-Dm-Apap] Itching    Skin peels on hands, and feet    Current Outpatient Prescriptions  Medication Sig Dispense Refill  . acyclovir (ZOVIRAX) 400 MG tablet Take 1 tablet (400 mg total) by mouth 2 (two) times daily. Beginning 11/12/2013  60 tablet  4  . atenolol-chlorthalidone (TENORETIC) 50-25 MG per tablet Take 1 tablet by mouth daily.  30 tablet  1  . docusate sodium (COLACE) 100 MG capsule Take 100 mg by mouth daily.      . ferrous sulfate 325 (65 FE) MG tablet Take  325 mg by mouth 2 (two) times daily with a meal.      . Loratadine 10 MG CAPS Take 1 capsule (10 mg total) by mouth as directed.  30 each  3  . metFORMIN (GLUCOPHAGE) 500 MG tablet Take 1 tablet (500 mg total) by mouth 2 (two) times daily with a meal.  60 tablet  1  . Multiple Vitamin (MULTIVITAMIN WITH MINERALS) TABS tablet Take 1 tablet by mouth daily.      . pantoprazole (PROTONIX) 40 MG tablet Take 1 tablet (40 mg total) by mouth daily.  30 tablet  5  . albuterol (PROVENTIL HFA;VENTOLIN HFA) 108 (90 BASE) MCG/ACT inhaler Inhale 2 puffs into the lungs every 6 (six) hours as needed for wheezing or shortness of breath.      . ciprofloxacin (CIPRO) 500 MG tablet Take 1 tablet (500 mg total) by mouth 2 (two) times daily.  14 tablet  0  . dexamethasone (DECADRON) 4 MG tablet Take 2 tablets by mouth once  a day on the day after chemotherapy and then take 2 tablets two times a day for 2 days. Take with food.  30 tablet  1  . lidocaine-prilocaine (EMLA) cream Apply 1 application topically as needed. Apply over port area 1-2 hours before chemo and cover with plastic wrap  30 g  0  . LORazepam (ATIVAN) 0.5 MG tablet Take 1 tablet (0.5 mg total) by mouth at bedtime as needed (Nausea or vomiting).  30 tablet  0  . oxyCODONE (OXY IR/ROXICODONE) 5 MG immediate release tablet Take 1 tablet (5 mg total) by mouth every 4 (four) hours as needed for severe pain.  30 tablet  0  . potassium chloride SA (K-DUR,KLOR-CON) 20 MEQ tablet Take 0.5 tablets (10 mEq total) by mouth daily.  20 tablet  2  . prochlorperazine (COMPAZINE) 10 MG tablet Take 1 tablet (10 mg total) by mouth every 6 (six) hours as needed (Nausea or vomiting).  30 tablet  1   No current facility-administered medications for this visit.    OBJECTIVE: Middle-aged [place] American woman who appears tired but is in no acute distress Filed Vitals:   12/03/13 1317  BP: 108/74  Pulse: 82  Temp: 98.4 F (36.9 C)  Resp: 18     Body mass index is 30.13  kg/(m^2).    ECOG FS:1 - Symptomatic but completely ambulatory Filed Weights   12/03/13 1317  Weight: 175 lb 9.6 oz (79.652 kg)   Physical Exam: HEENT:  Sclerae anicteric.  There are some areas of hyperpigmentation on the tongue. Oropharynx clear, pink, and moist. No evidence of mucositis or oropharyngeal candidiasis. Neck supple, trachea midline.  NODES:  No cervical or supraclavicular lymphadenopathy palpated.  BREAST EXAM:  Right breast is unremarkable. Right axilla is benign. There is a palpable mass in the upper outer quadrant of the left breast, easily palpated, measuring approximately 3 cm. There are no obvious skin changes. No nipple inversion. There is a palpable lymph node in the left axilla, approximately 2 cm in diameter and movable. LUNGS:  Clear to auscultation bilaterally with good excursion.  No wheezes or rhonchi HEART:  Regular rate and rhythm. No murmur appreciated. ABDOMEN:  Soft, nontender. No organomegaly or masses palpated. Positive bowel sounds.  MSK:  No focal spinal tenderness to palpation. Full range of motion bilaterally in the upper extremities. EXTREMITIES:  No peripheral edema.  No lymphedema in the left upper extremity. SKIN: No visible rashes. Mild hyperpigmentation of the skin of the hands, and also the fingernails bilaterally. No tenderness in the nailbeds, and no drainage or evidence of infection. No excessive ecchymoses. No petechiae. Skin is warm and dry. NEURO:  Nonfocal. Well oriented.  Appropriate affect.    LAB RESULTS:   Lab Results  Component Value Date   WBC 0.7* 12/03/2013   NEUTROABS 0.3* 12/03/2013   HGB 8.6* 12/03/2013   HCT 26.7* 12/03/2013   MCV 83.2 12/03/2013   PLT 273 12/03/2013      Chemistry      Component Value Date/Time   NA 138 12/03/2013 1255   NA 140 10/21/2013 1158   K 4.2 12/03/2013 1255   K 3.9 10/21/2013 1158   CL 105 10/21/2013 1158   CO2 26 12/03/2013 1255   CO2 21 10/21/2013 1158   BUN 17.9 12/03/2013 1255   BUN 16 10/21/2013 1158    CREATININE 1.1 12/03/2013 1255   CREATININE 0.95 10/21/2013 1158      Component Value Date/Time   CALCIUM 9.5 12/03/2013  1255   CALCIUM 9.9 10/21/2013 1158   ALKPHOS 90 12/03/2013 1255   ALKPHOS 81 11/27/2012 0900   AST 12 12/03/2013 1255   AST 22 11/27/2012 0900   ALT 11 12/03/2013 1255   ALT 17 11/27/2012 0900   BILITOT 0.26 12/03/2013 1255   BILITOT 0.1* 11/27/2012 0900        STUDIES:  Echocardiogram on 10/29/2013 showed an ejection fraction of 55-60%.  US Soft Tissue Head/neck 10/31/2013   CLINICAL DATA:  Hypermetabolic left thyroid focus by PET-CT. History of breast cancer  EXAM: THYROID ULTRASOUND  TECHNIQUE: Ultrasound examination of the thyroid gland and adjacent soft tissues was performed.  COMPARISON:  10/20/2013 PET-CT  FINDINGS: Right thyroid lobe  Measurements: 4.9 x 1.5 x 1.5 cm. Mild heterogeneous gland echotexture. Right inferior echogenic solid thyroid nodule only measures 6 mm.  Left thyroid lobe  Measurements: 4.2 x 1.5 x 1.4 cm. Symmetric mild gland heterogeneity. Focal well-circumscribed 10 x 8 x 8 mm left upper pole thyroid nodule evident. This correlates with the PET-CT finding.  Isthmus  Thickness: 1.3 mm.  No nodules visualized.  Lymphadenopathy  None visualized.  IMPRESSION: 10 mm solid well-circumscribed left upper pole thyroid nodule correlates with the hypermetabolic PET-CT abnormality. Because of these findings, recommend ultrasound FNA biopsy.  Incidental right inferior 6 mm nodule.   Electronically Signed   By: Daryll Brod M.D.   On: 10/31/2013 12:17    ASSESSMENT: 43 y.o. [place] woman   (1)  status post left breast and left axillary lymph node biopsy 10/03/2013, both positive for a clinical T2 N2, stage IIIA invasive ductal carcinoma, grade 2, estrogen and progesterone receptor positive, HER-2 negative, with an MIB-1 of 20%.  (2) Treated with neoadjuvant chemotherapy to consist of doxorubicin and cyclophosphamide in dose dense fashion x4, with Neulasta support,  to be followed by paclitaxel weekly x12  (2) definitive surgery to follow chemotherapy  (3) radiation to follow surgery  (4) tamoxifen to be started at the end of radiation  (5) continuing tobacco abuse: The patient has been strongly advised to discontinue smoking  (6) genetic testing sent 10/16/2013, negative for mutation  PLAN:  Shelly Hall is doing well today.  Shelly Hall is subsequently neutropenic following her chemotherapy.  Shelly Hall will restart Cipro as prophylaxis.  I gave her neutropenic instructions, and reviewed these with her in detail.  Shelly Hall does not have any sign of infection.    The patient will receive her fourth and final dose of doxorubicin/cyclophosphamide on July 9, after which we will need to repeat her breast MRI to assess response. Shelly Hall will then be seen by Dr. Jana Hakim as well as her surgeon, Dr. Barry Dienes, to review those results.  The above was reviewed in detail with the patient today. Shelly Hall voices her understanding and agreement with our plan and will call with any changes or problems prior to her next appointment.  I spent 25 minutes counseling the patient face to face.  The total time spent in the appointment was 30 minutes.   Minette Headland, [place] [phone] 12/08/2013 8:32 PM

## 2013-12-04 ENCOUNTER — Other Ambulatory Visit: Payer: No Typology Code available for payment source

## 2013-12-04 ENCOUNTER — Ambulatory Visit: Payer: No Typology Code available for payment source | Admitting: Physician Assistant

## 2013-12-11 ENCOUNTER — Other Ambulatory Visit (HOSPITAL_BASED_OUTPATIENT_CLINIC_OR_DEPARTMENT_OTHER): Payer: Medicaid Other

## 2013-12-11 ENCOUNTER — Encounter: Payer: Self-pay | Admitting: Physician Assistant

## 2013-12-11 ENCOUNTER — Telehealth: Payer: Self-pay | Admitting: *Deleted

## 2013-12-11 ENCOUNTER — Ambulatory Visit (HOSPITAL_BASED_OUTPATIENT_CLINIC_OR_DEPARTMENT_OTHER): Payer: Medicaid Other | Admitting: Physician Assistant

## 2013-12-11 ENCOUNTER — Telehealth: Payer: Self-pay | Admitting: Internal Medicine

## 2013-12-11 ENCOUNTER — Other Ambulatory Visit: Payer: Self-pay | Admitting: Oncology

## 2013-12-11 ENCOUNTER — Ambulatory Visit (HOSPITAL_BASED_OUTPATIENT_CLINIC_OR_DEPARTMENT_OTHER): Payer: Medicaid Other

## 2013-12-11 VITALS — BP 122/83 | HR 85 | Temp 98.0°F | Resp 18 | Ht 64.0 in | Wt 175.6 lb

## 2013-12-11 DIAGNOSIS — M899 Disorder of bone, unspecified: Secondary | ICD-10-CM

## 2013-12-11 DIAGNOSIS — C50419 Malignant neoplasm of upper-outer quadrant of unspecified female breast: Secondary | ICD-10-CM

## 2013-12-11 DIAGNOSIS — C50919 Malignant neoplasm of unspecified site of unspecified female breast: Secondary | ICD-10-CM

## 2013-12-11 DIAGNOSIS — F17200 Nicotine dependence, unspecified, uncomplicated: Secondary | ICD-10-CM

## 2013-12-11 DIAGNOSIS — C50412 Malignant neoplasm of upper-outer quadrant of left female breast: Secondary | ICD-10-CM

## 2013-12-11 DIAGNOSIS — M949 Disorder of cartilage, unspecified: Secondary | ICD-10-CM

## 2013-12-11 DIAGNOSIS — Z170 Estrogen receptor positive status [ER+]: Secondary | ICD-10-CM

## 2013-12-11 DIAGNOSIS — C773 Secondary and unspecified malignant neoplasm of axilla and upper limb lymph nodes: Secondary | ICD-10-CM

## 2013-12-11 DIAGNOSIS — Z5111 Encounter for antineoplastic chemotherapy: Secondary | ICD-10-CM

## 2013-12-11 LAB — COMPREHENSIVE METABOLIC PANEL (CC13)
ALK PHOS: 78 U/L (ref 40–150)
ALT: 15 U/L (ref 0–55)
AST: 16 U/L (ref 5–34)
Albumin: 3.7 g/dL (ref 3.5–5.0)
Anion Gap: 10 mEq/L (ref 3–11)
BUN: 13.7 mg/dL (ref 7.0–26.0)
CALCIUM: 9.6 mg/dL (ref 8.4–10.4)
CO2: 25 meq/L (ref 22–29)
Chloride: 102 mEq/L (ref 98–109)
Creatinine: 1.1 mg/dL (ref 0.6–1.1)
GLUCOSE: 165 mg/dL — AB (ref 70–140)
Potassium: 3.9 mEq/L (ref 3.5–5.1)
Sodium: 138 mEq/L (ref 136–145)
TOTAL PROTEIN: 7.7 g/dL (ref 6.4–8.3)
Total Bilirubin: <0.2 mg/dL (ref 0.20–1.20)

## 2013-12-11 LAB — CBC WITH DIFFERENTIAL/PLATELET
BASO%: 0.5 % (ref 0.0–2.0)
BASOS ABS: 0 10*3/uL (ref 0.0–0.1)
EOS ABS: 0.1 10*3/uL (ref 0.0–0.5)
EOS%: 0.6 % (ref 0.0–7.0)
HCT: 28.5 % — ABNORMAL LOW (ref 34.8–46.6)
HEMOGLOBIN: 9.3 g/dL — AB (ref 11.6–15.9)
LYMPH%: 7.1 % — ABNORMAL LOW (ref 14.0–49.7)
MCH: 27.9 pg (ref 25.1–34.0)
MCHC: 32.5 g/dL (ref 31.5–36.0)
MCV: 85.9 fL (ref 79.5–101.0)
MONO#: 1 10*3/uL — AB (ref 0.1–0.9)
MONO%: 10.4 % (ref 0.0–14.0)
NEUT%: 81.4 % — ABNORMAL HIGH (ref 38.4–76.8)
NEUTROS ABS: 7.9 10*3/uL — AB (ref 1.5–6.5)
PLATELETS: 273 10*3/uL (ref 145–400)
RBC: 3.32 10*6/uL — ABNORMAL LOW (ref 3.70–5.45)
RDW: 23.8 % — AB (ref 11.2–14.5)
WBC: 9.7 10*3/uL (ref 3.9–10.3)
lymph#: 0.7 10*3/uL — ABNORMAL LOW (ref 0.9–3.3)

## 2013-12-11 MED ORDER — HEPARIN SOD (PORK) LOCK FLUSH 100 UNIT/ML IV SOLN
500.0000 [IU] | Freq: Once | INTRAVENOUS | Status: AC | PRN
  Administered 2013-12-11: 500 [IU]
  Filled 2013-12-11: qty 5

## 2013-12-11 MED ORDER — DEXAMETHASONE SODIUM PHOSPHATE 20 MG/5ML IJ SOLN
12.0000 mg | Freq: Once | INTRAMUSCULAR | Status: AC
  Administered 2013-12-11: 12 mg via INTRAVENOUS

## 2013-12-11 MED ORDER — SODIUM CHLORIDE 0.9 % IV SOLN
150.0000 mg | Freq: Once | INTRAVENOUS | Status: AC
  Administered 2013-12-11: 150 mg via INTRAVENOUS
  Filled 2013-12-11: qty 5

## 2013-12-11 MED ORDER — PALONOSETRON HCL INJECTION 0.25 MG/5ML
0.2500 mg | Freq: Once | INTRAVENOUS | Status: AC
  Administered 2013-12-11: 0.25 mg via INTRAVENOUS

## 2013-12-11 MED ORDER — SODIUM CHLORIDE 0.9 % IJ SOLN
10.0000 mL | INTRAMUSCULAR | Status: DC | PRN
  Administered 2013-12-11: 10 mL
  Filled 2013-12-11: qty 10

## 2013-12-11 MED ORDER — SODIUM CHLORIDE 0.9 % IV SOLN
Freq: Once | INTRAVENOUS | Status: AC
  Administered 2013-12-11: 11:00:00 via INTRAVENOUS

## 2013-12-11 MED ORDER — DOXORUBICIN HCL CHEMO IV INJECTION 2 MG/ML
60.0000 mg/m2 | Freq: Once | INTRAVENOUS | Status: AC
  Administered 2013-12-11: 116 mg via INTRAVENOUS
  Filled 2013-12-11: qty 58

## 2013-12-11 MED ORDER — DEXAMETHASONE SODIUM PHOSPHATE 20 MG/5ML IJ SOLN
INTRAMUSCULAR | Status: AC
  Filled 2013-12-11: qty 5

## 2013-12-11 MED ORDER — SODIUM CHLORIDE 0.9 % IV SOLN
600.0000 mg/m2 | Freq: Once | INTRAVENOUS | Status: AC
  Administered 2013-12-11: 1160 mg via INTRAVENOUS
  Filled 2013-12-11: qty 58

## 2013-12-11 MED ORDER — PALONOSETRON HCL INJECTION 0.25 MG/5ML
INTRAVENOUS | Status: AC
  Filled 2013-12-11: qty 5

## 2013-12-11 NOTE — Telephone Encounter (Signed)
Gave pt appt for lab and MD , emailed Michelle regarding chemo °

## 2013-12-11 NOTE — Telephone Encounter (Signed)
Per staff message and POF I have scheduled appts. Advised scheduler of appts. JMW  

## 2013-12-11 NOTE — Progress Notes (Signed)
[place]  [phone] [phone]     ID: Shelly Hall OB: 1970-06-19  MR#: [id number]  ZHY#:[id number]  PCP: No PCP Per Patient GYN:  Shelly Hall SU: Shelly Hall OTHER MD: Shelly Hall  CHIEF COMPLAINT:  Left Breast Cancer/Neoadjuvant Chemotherapy  BREAST CANCER HISTORY: Shelly Hall palpated a mass in her left breast mid-April 2015 and immediately brought it to Shelly Hall' attention. She was set up for bilateral screening mammography with tomography at [facility] hospital 09/19/2013. The possible distortion in the left breast and a nearby group of calcifications was felt to warrant further evaluation, and on 09/30/2013 she underwent unilateral left diagnostic mammography and ultrasonography. There was an irregular mass in the outer left breast associated with microcalcifications. 4 cm anterior to this there was another cluster of pleomorphic calcifications spanning 7 mm. He had more anteriorly there was an irregular mass associated with other calcifications. In total the abnormalities in the left breast spent approximately 10 cm, and is segmental distribution. On exam there was a palpable firm mass at the 2:30 o'clock position of the left breast 10 cm from the nipple. There was palpable adenopathy in the inferior left axilla. Ultrasound confirmed an irregular hypoechoic mass in the left breast measuring 4.6 cm. In addition, a 1.4 cm oval slightly irregular mass was noted at the 3:00 position and yet another mass measured 9 mm in the same area. Ultrasound of the left axilla showed a dominant 3.6 cm lymph node.  Biopsy of 2 of the left breast masses and the suspicious left breast lymph node on 10/03/2013 showed ([id number]) an invasive ductal carcinoma, grade 2, estrogen receptor 90% positive with strong staining intensity, progesterone receptor 40% positive but moderate staining intensity, with an MIB-1 of 20% and no HER-2 amplification. (Because all 3 biopsies  were morphologically identical, only one prognostic panel was sent).  On 10/10/2013 the patient underwent bilateral breast MRI. This showed numerous enhancing masses in the left breast, the largest measuring 3.5 cm, and the aggregate measuring 12.7 cm. There were numerous enlarged left axillary lymph nodes, at least 5 of which were enlarged, both at levels 1 and 2. The largest measured 2.3 cm. The right breast was negative.  The patient's subsequent history is as detailed below.  INTERVAL HISTORY: Shelly Hall is doing moderately well today following her third cycle of neoadjuvant Doxorubicin and Cyclophosphamide.  She presents to proceed with cycle 4 day 1.  She does have significant bone pain due to the Neulasta and requires Oxycodone to be able to move. She reports less pain after cycle #3 his Neulasta injection. She states that that particular injection was given her abdomen and the pain was not as severe. She has occasional nausea that is well-controlled with her current antiemetics. Otherwise, she denies fevers, chills, nausea, vomiting, constipation, diarrhea, numbness/tingling, skin changes, or any further concerns.   REVIEW OF SYSTEMS: A 10 point review of systems was conducted and is otherwise negative except for what is noted above.    PAST MEDICAL HISTORY: Past Medical History  Diagnosis Date  . Diabetes mellitus   . Hypertension   . Sarcoidosis   . Anxiety   . Depression   . Anemia   . Complication of anesthesia     makes pt. restless and unable to be still  . Cancer     breast cancer    PAST SURGICAL HISTORY: Past Surgical History  Procedure Laterality Date  . Tubal ligation  2001  . Endobronchial ultrasound Bilateral  12/02/2012    Procedure: ENDOBRONCHIAL ULTRASOUND;  Surgeon: Collene Gobble, MD;  Location: [facility];  Service: Cardiopulmonary;  Laterality: Bilateral;  . Breast surgery Left     breast biopsy times 3  . Portacath placement N/A 10/22/2013    Procedure:  INSERTION PORT-A-CATH;  Surgeon: Shelly Klein, MD;  Location: [facility] OR;  Service: General;  Laterality: N/A;    FAMILY HISTORY Family History  Problem Relation Age of Onset  . Cancer Paternal Aunt     "bone cancer"; deceased 70s  . Cancer Paternal Uncle     stomach cancer; deceased 58s  . Cancer Paternal Aunt     unk. primary; currently 61s  . Prostate cancer Maternal Grandfather 90  The patient's parents are living, in fair mid to late 44s. The patient had one brother, no sisters. There is no history of breast or ovarian cancer in the family to her knowledge.   GYNECOLOGIC HISTORY:   (Reviewed 11/27/2013)  menarche age 85, first live birth at 37. She is GX P2. She was still having regular periods at the time of the start of her chemotherapy in May 2015.  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 She is single and lives alone, although her mother is currently staying with her. Her son Shelly Hall lives in [place] and works at [employer]. Son Shelly Hall lives in [place] where he works at a [employer]. The patient has 1 grandchild on the way. She is a Psychologist, forensic.    ADVANCED DIRECTIVES: not in place   HEALTH MAINTENANCE: (Updated 11/27/2013) History  Substance Use Topics  . Smoking status: Former Smoker -- 1.00 packs/day for 23 years    Types: Cigarettes    Quit date: 10/19/2013  . Smokeless tobacco: Never Used  . Alcohol Use: 1.0 oz/week    2 drink(s) per week     Comment: occ. x2 dks per week     Colonoscopy: Never  PAP: Not on file  Bone density: Not on file  Lipid panel:  Not on file  Allergies  Allergen Reactions  . Nyquil Multi-Symptom [Pseudoeph-Doxylamine-Dm-Apap] Itching    Skin peels on hands, and feet    Current Outpatient Prescriptions  Medication Sig Dispense Refill  . acyclovir (ZOVIRAX) 400 MG tablet Take 1 tablet (400 mg total) by mouth 2 (two) times daily. Beginning 11/12/2013  60 tablet   4  . atenolol-chlorthalidone (TENORETIC) 50-25 MG per tablet Take 1 tablet by mouth daily.  30 tablet  1  . dexamethasone (DECADRON) 4 MG tablet Take 2 tablets by mouth once a day on the day after chemotherapy and then take 2 tablets two times a day for 2 days. Take with food.  30 tablet  1  . docusate sodium (COLACE) 100 MG capsule Take 100 mg by mouth daily.      . ferrous sulfate 325 (65 FE) MG tablet Take 325 mg by mouth 2 (two) times daily with a meal.      . lidocaine-prilocaine (EMLA) cream Apply 1 application topically as needed. Apply over port area 1-2 hours before chemo and cover with plastic wrap  30 g  0  . Loratadine 10 MG CAPS Take 1 capsule (10 mg total) by mouth as directed.  30 each  3  . LORazepam (ATIVAN) 0.5 MG tablet Take 1 tablet (0.5 mg total) by mouth at bedtime as needed (Nausea or vomiting).  30 tablet  0  . metFORMIN (GLUCOPHAGE) 500 MG tablet  Take 1 tablet (500 mg total) by mouth 2 (two) times daily with a meal.  60 tablet  1  . Multiple Vitamin (MULTIVITAMIN WITH MINERALS) TABS tablet Take 1 tablet by mouth daily.      . pantoprazole (PROTONIX) 40 MG tablet Take 1 tablet (40 mg total) by mouth daily.  30 tablet  5  . prochlorperazine (COMPAZINE) 10 MG tablet Take 1 tablet (10 mg total) by mouth every 6 (six) hours as needed (Nausea or vomiting).  30 tablet  1  . albuterol (PROVENTIL HFA;VENTOLIN HFA) 108 (90 BASE) MCG/ACT inhaler Inhale 2 puffs into the lungs every 6 (six) hours as needed for wheezing or shortness of breath.      . ciprofloxacin (CIPRO) 500 MG tablet Take 1 tablet (500 mg total) by mouth 2 (two) times daily.  14 tablet  0  . HYDROcodone-ibuprofen (VICOPROFEN) 7.5-200 MG per tablet       . oxyCODONE (OXY IR/ROXICODONE) 5 MG immediate release tablet Take 1 tablet (5 mg total) by mouth every 4 (four) hours as needed for severe pain.  30 tablet  0  . potassium chloride SA (K-DUR,KLOR-CON) 20 MEQ tablet Take 0.5 tablets (10 mEq total) by mouth daily.  20  tablet  2   No current facility-administered medications for this visit.    OBJECTIVE: Middle-aged [place] American woman in no acute distress Filed Vitals:   12/11/13 0925  BP: 122/83  Pulse: 85  Temp: 98 F (36.7 C)  Resp: 18     Body mass index is 30.13 kg/(m^2).    ECOG FS:1 - Symptomatic but completely ambulatory Filed Weights   12/11/13 0925  Weight: 175 lb 9.6 oz (79.652 kg)   Physical Exam: HEENT:  Sclerae anicteric.  There are some areas of hyperpigmentation on the tongue. Oropharynx clear, pink, and moist. No evidence of mucositis or oropharyngeal candidiasis. Neck supple, trachea midline.  NODES:  No cervical or supraclavicular lymphadenopathy palpated.  BREAST EXAM: Deferred  LUNGS:  Clear to auscultation bilaterally with good excursion.  No wheezes or rhonchi HEART:  Regular rate and rhythm. No murmur appreciated. ABDOMEN:  Soft, nontender. No organomegaly or masses palpated. Positive bowel sounds.  MSK:  No focal spinal tenderness to palpation. Full range of motion bilaterally in the upper extremities. EXTREMITIES:  No peripheral edema.  No lymphedema in the left upper extremity. SKIN: No visible rashes. Mild hyperpigmentation of the skin of the hands, and also the fingernails bilaterally. No tenderness in the nailbeds, and no drainage or evidence of infection. No excessive ecchymoses. No petechiae. Skin is warm and dry. NEURO:  Nonfocal. Well oriented.  Appropriate affect.    LAB RESULTS:   Lab Results  Component Value Date   WBC 9.7 12/11/2013   NEUTROABS 7.9* 12/11/2013   HGB 9.3* 12/11/2013   HCT 28.5* 12/11/2013   MCV 85.9 12/11/2013   PLT 273 12/11/2013      Chemistry      Component Value Date/Time   NA 138 12/11/2013 0911   NA 140 10/21/2013 1158   K 3.9 12/11/2013 0911   K 3.9 10/21/2013 1158   CL 105 10/21/2013 1158   CO2 25 12/11/2013 0911   CO2 21 10/21/2013 1158   BUN 13.7 12/11/2013 0911   BUN 16 10/21/2013 1158   CREATININE 1.1 12/11/2013 0911   CREATININE  0.95 10/21/2013 1158      Component Value Date/Time   CALCIUM 9.6 12/11/2013 0911   CALCIUM 9.9 10/21/2013 1158   ALKPHOS 78  12/11/2013 0911   ALKPHOS 81 11/27/2012 0900   AST 16 12/11/2013 0911   AST 22 11/27/2012 0900   ALT 15 12/11/2013 0911   ALT 17 11/27/2012 0900   BILITOT <0.20 12/11/2013 0911   BILITOT 0.1* 11/27/2012 0900        STUDIES:  Echocardiogram on 10/29/2013 showed an ejection fraction of 55-60%.  US Soft Tissue Head/neck 10/31/2013   CLINICAL DATA:  Hypermetabolic left thyroid focus by PET-CT. History of breast cancer  EXAM: THYROID ULTRASOUND  TECHNIQUE: Ultrasound examination of the thyroid gland and adjacent soft tissues was performed.  COMPARISON:  10/20/2013 PET-CT  FINDINGS: Right thyroid lobe  Measurements: 4.9 x 1.5 x 1.5 cm. Mild heterogeneous gland echotexture. Right inferior echogenic solid thyroid nodule only measures 6 mm.  Left thyroid lobe  Measurements: 4.2 x 1.5 x 1.4 cm. Symmetric mild gland heterogeneity. Focal well-circumscribed 10 x 8 x 8 mm left upper pole thyroid nodule evident. This correlates with the PET-CT finding.  Isthmus  Thickness: 1.3 mm.  No nodules visualized.  Lymphadenopathy  None visualized.  IMPRESSION: 10 mm solid well-circumscribed left upper pole thyroid nodule correlates with the hypermetabolic PET-CT abnormality. Because of these findings, recommend ultrasound FNA biopsy.  Incidental right inferior 6 mm nodule.   Electronically Signed   By: Daryll Brod M.D.   On: 10/31/2013 12:17    ASSESSMENT: 43 y.o. [place] woman   (1)  status post left breast and left axillary lymph node biopsy 10/03/2013, both positive for a clinical T2 N2, stage IIIA invasive ductal carcinoma, grade 2, estrogen and progesterone receptor positive, HER-2 negative, with an MIB-1 of 20%.  (2) Treated with neoadjuvant chemotherapy to consist of doxorubicin and cyclophosphamide in dose dense fashion x4, with Neulasta support, to be followed by paclitaxel weekly  x12  (2) definitive surgery to follow chemotherapy  (3) radiation to follow surgery  (4) tamoxifen to be started at the end of radiation  (5) continuing tobacco abuse: The patient has been strongly advised to discontinue smoking  (6) genetic testing sent 10/16/2013, negative for mutation  PLAN:  Shelly Hall is doing well today.  She'll proceed with cycle #4 today as scheduled. She'll followup with Dr. Jana Hakim in one week as previously scheduled for another symptom management visit and to discuss the next phase of her treatment with weekly Taxol.   Her breast MRI to assess response is scheduled for 12/15/2013. She will then be seen by Dr. Jana Hakim as well as her surgeon, Dr. Barry Dienes, to review those results.  The above was reviewed in detail with the patient today. She voices her understanding and agreement with our plan and will call with any changes or problems prior to her next appointment.  I spent 25 minutes counseling the patient face to face.  The total time spent in the appointment was 30 minutes.  The patient was reviewed with Dr. Jana Hakim.  Shelly Hall, [place], [place] [phone] 12/11/2013 9:57 AM

## 2013-12-11 NOTE — Patient Instructions (Signed)
[facility] Discharge Instructions for Patients Receiving Chemotherapy  Today you received the following chemotherapy agents: Adriamycin, Cytoxan  To help prevent nausea and vomiting after your treatment, we encourage you to take your nausea medication as prescribed by your physician.   If you develop nausea and vomiting that is not controlled by your nausea medication, call the clinic.   BELOW ARE SYMPTOMS THAT SHOULD BE REPORTED IMMEDIATELY:  *FEVER GREATER THAN 100.5 F  *CHILLS WITH OR WITHOUT FEVER  NAUSEA AND VOMITING THAT IS NOT CONTROLLED WITH YOUR NAUSEA MEDICATION  *UNUSUAL SHORTNESS OF BREATH  *UNUSUAL BRUISING OR BLEEDING  TENDERNESS IN MOUTH AND THROAT WITH OR WITHOUT PRESENCE OF ULCERS  *URINARY PROBLEMS  *BOWEL PROBLEMS  UNUSUAL RASH Items with * indicate a potential emergency and should be followed up as soon as possible.  Feel free to call the clinic you have any questions or concerns. The clinic phone number is [phone].    

## 2013-12-12 ENCOUNTER — Other Ambulatory Visit: Payer: Self-pay | Admitting: *Deleted

## 2013-12-12 ENCOUNTER — Ambulatory Visit (HOSPITAL_BASED_OUTPATIENT_CLINIC_OR_DEPARTMENT_OTHER): Payer: Medicaid Other

## 2013-12-12 VITALS — BP 125/80 | HR 79 | Temp 98.3°F

## 2013-12-12 DIAGNOSIS — Z5189 Encounter for other specified aftercare: Secondary | ICD-10-CM

## 2013-12-12 DIAGNOSIS — C50919 Malignant neoplasm of unspecified site of unspecified female breast: Secondary | ICD-10-CM

## 2013-12-12 DIAGNOSIS — C50419 Malignant neoplasm of upper-outer quadrant of unspecified female breast: Secondary | ICD-10-CM

## 2013-12-12 DIAGNOSIS — C50412 Malignant neoplasm of upper-outer quadrant of left female breast: Secondary | ICD-10-CM

## 2013-12-12 DIAGNOSIS — C773 Secondary and unspecified malignant neoplasm of axilla and upper limb lymph nodes: Secondary | ICD-10-CM

## 2013-12-12 MED ORDER — PEGFILGRASTIM INJECTION 6 MG/0.6ML
6.0000 mg | Freq: Once | SUBCUTANEOUS | Status: AC
  Administered 2013-12-12: 6 mg via SUBCUTANEOUS
  Filled 2013-12-12: qty 0.6

## 2013-12-12 MED ORDER — POTASSIUM CHLORIDE CRYS ER 20 MEQ PO TBCR: 20.0000 meq | EXTENDED_RELEASE_TABLET | Freq: Every day | ORAL | Status: DC

## 2013-12-13 NOTE — Patient Instructions (Signed)
Continue labs and chemotherapy as scheduled Follow-up in one week 

## 2013-12-15 ENCOUNTER — Inpatient Hospital Stay (HOSPITAL_COMMUNITY)
Admission: RE | Admit: 2013-12-15 | Discharge: 2013-12-15 | Disposition: A | Discharge status: Home / Self Care | Payer: No Typology Code available for payment source | Source: Ambulatory Visit

## 2013-12-15 ENCOUNTER — Other Ambulatory Visit: Payer: Self-pay | Admitting: Physician Assistant

## 2013-12-15 ENCOUNTER — Telehealth: Payer: Self-pay

## 2013-12-15 ENCOUNTER — Ambulatory Visit (HOSPITAL_COMMUNITY)
Admission: RE | Admit: 2013-12-15 | Discharge: 2013-12-15 | Disposition: A | Discharge status: Home / Self Care | Payer: No Typology Code available for payment source | Source: Ambulatory Visit | Attending: Oncology | Admitting: Oncology

## 2013-12-15 DIAGNOSIS — C50412 Malignant neoplasm of upper-outer quadrant of left female breast: Secondary | ICD-10-CM

## 2013-12-15 NOTE — Telephone Encounter (Signed)
Faxed corrected order to [facility] for pt MRI.  Sent to scan.

## 2013-12-16 ENCOUNTER — Other Ambulatory Visit: Payer: Self-pay | Admitting: Adult Health

## 2013-12-16 ENCOUNTER — Telehealth: Payer: Self-pay | Admitting: *Deleted

## 2013-12-16 ENCOUNTER — Ambulatory Visit
Admission: RE | Admit: 2013-12-16 | Discharge: 2013-12-16 | Disposition: A | Discharge status: Home / Self Care | Payer: No Typology Code available for payment source | Source: Ambulatory Visit | Attending: Adult Health | Admitting: Adult Health

## 2013-12-16 ENCOUNTER — Ambulatory Visit: Admission: RE | Admit: 2013-12-16 | Payer: No Typology Code available for payment source | Source: Ambulatory Visit

## 2013-12-16 DIAGNOSIS — C50412 Malignant neoplasm of upper-outer quadrant of left female breast: Secondary | ICD-10-CM

## 2013-12-16 MED ORDER — GADOBENATE DIMEGLUMINE 529 MG/ML IV SOLN
16.0000 mL | Freq: Once | INTRAVENOUS | Status: AC | PRN
  Administered 2013-12-16: 16 mL via INTRAVENOUS

## 2013-12-16 NOTE — Telephone Encounter (Signed)
This RN spoke with pt per her call late in the day stating " I just haven't felt good since Saturday " " I get dizzy just walking to the bathroom"  Per discussion Lameshia denies any fever " I have been checking it ".  She states blood sugars are good.  She is able to eat and drink with fluid intake of about 50-60 ounces of water and gatorade a day. She has mild nausea but no vomiting.  She does not feel SOB - " just tired ". She does not have any chest discomfort.  Plan per call is pt will come in for lab a day early and wait for this RN for review.

## 2013-12-17 ENCOUNTER — Ambulatory Visit (HOSPITAL_BASED_OUTPATIENT_CLINIC_OR_DEPARTMENT_OTHER): Payer: Medicaid Other

## 2013-12-17 ENCOUNTER — Other Ambulatory Visit: Payer: Self-pay | Admitting: *Deleted

## 2013-12-17 ENCOUNTER — Other Ambulatory Visit (HOSPITAL_BASED_OUTPATIENT_CLINIC_OR_DEPARTMENT_OTHER): Payer: Medicaid Other

## 2013-12-17 DIAGNOSIS — E860 Dehydration: Secondary | ICD-10-CM

## 2013-12-17 DIAGNOSIS — C50419 Malignant neoplasm of upper-outer quadrant of unspecified female breast: Secondary | ICD-10-CM

## 2013-12-17 DIAGNOSIS — R5381 Other malaise: Secondary | ICD-10-CM

## 2013-12-17 DIAGNOSIS — C50919 Malignant neoplasm of unspecified site of unspecified female breast: Secondary | ICD-10-CM

## 2013-12-17 DIAGNOSIS — C773 Secondary and unspecified malignant neoplasm of axilla and upper limb lymph nodes: Secondary | ICD-10-CM

## 2013-12-17 DIAGNOSIS — I951 Orthostatic hypotension: Secondary | ICD-10-CM

## 2013-12-17 DIAGNOSIS — C50412 Malignant neoplasm of upper-outer quadrant of left female breast: Secondary | ICD-10-CM

## 2013-12-17 DIAGNOSIS — R42 Dizziness and giddiness: Secondary | ICD-10-CM

## 2013-12-17 DIAGNOSIS — R5383 Other fatigue: Secondary | ICD-10-CM

## 2013-12-17 DIAGNOSIS — R110 Nausea: Secondary | ICD-10-CM

## 2013-12-17 LAB — COMPREHENSIVE METABOLIC PANEL (CC13)
ALBUMIN: 3.8 g/dL (ref 3.5–5.0)
ALT: 15 U/L (ref 0–55)
AST: 16 U/L (ref 5–34)
Alkaline Phosphatase: 98 U/L (ref 40–150)
Anion Gap: 11 mEq/L (ref 3–11)
BUN: 18.3 mg/dL (ref 7.0–26.0)
CALCIUM: 9.7 mg/dL (ref 8.4–10.4)
CHLORIDE: 102 meq/L (ref 98–109)
CO2: 24 mEq/L (ref 22–29)
CREATININE: 1 mg/dL (ref 0.6–1.1)
Glucose: 146 mg/dl — ABNORMAL HIGH (ref 70–140)
Potassium: 3.8 mEq/L (ref 3.5–5.1)
Sodium: 137 mEq/L (ref 136–145)
Total Bilirubin: 0.27 mg/dL (ref 0.20–1.20)
Total Protein: 7.8 g/dL (ref 6.4–8.3)

## 2013-12-17 LAB — CBC WITH DIFFERENTIAL/PLATELET
BASO%: 3.3 % — ABNORMAL HIGH (ref 0.0–2.0)
BASOS ABS: 0 10*3/uL (ref 0.0–0.1)
EOS%: 1.6 % (ref 0.0–7.0)
Eosinophils Absolute: 0 10*3/uL (ref 0.0–0.5)
HEMATOCRIT: 26.6 % — AB (ref 34.8–46.6)
HEMOGLOBIN: 8.6 g/dL — AB (ref 11.6–15.9)
LYMPH#: 0.3 10*3/uL — AB (ref 0.9–3.3)
LYMPH%: 29.7 % (ref 14.0–49.7)
MCH: 27.8 pg (ref 25.1–34.0)
MCHC: 32.3 g/dL (ref 31.5–36.0)
MCV: 86.3 fL (ref 79.5–101.0)
MONO#: 0.1 10*3/uL (ref 0.1–0.9)
MONO%: 5.4 % (ref 0.0–14.0)
NEUT%: 60 % (ref 38.4–76.8)
NEUTROS ABS: 0.7 10*3/uL — AB (ref 1.5–6.5)
Platelets: 296 10*3/uL (ref 145–400)
RBC: 3.08 10*6/uL — ABNORMAL LOW (ref 3.70–5.45)
RDW: 22.4 % — ABNORMAL HIGH (ref 11.2–14.5)
WBC: 1.1 10*3/uL — AB (ref 3.9–10.3)

## 2013-12-17 MED ORDER — PROCHLORPERAZINE MALEATE 10 MG PO TABS
10.0000 mg | ORAL_TABLET | Freq: Once | ORAL | Status: AC
  Administered 2013-12-17: 10 mg via ORAL

## 2013-12-17 MED ORDER — SODIUM CHLORIDE 0.9 % IV SOLN
Freq: Once | INTRAVENOUS | Status: DC
  Administered 2013-12-17: 12:00:00 via INTRAVENOUS

## 2013-12-17 MED ORDER — PROCHLORPERAZINE MALEATE 10 MG PO TABS
ORAL_TABLET | ORAL | Status: AC
  Filled 2013-12-17: qty 1

## 2013-12-17 NOTE — Progress Notes (Signed)
Pt came in this am for lab check per call pm yesterday stating increased lightheadedness.   Labs checked with noted mild decline in heme - obtained ortho vitals sitting and standing.  Above reviewed with MD with recommendation for IVF today.  Pt is scheduled for follow up tomorrow.  Above discussed with pt who is in agreement to plan.

## 2013-12-17 NOTE — Patient Instructions (Signed)
Dehydration, Adult Dehydration is when you lose more fluids from the body than you take in. Vital organs like the kidneys, brain, and heart cannot function without a proper amount of fluids and salt. Any loss of fluids from the body can cause dehydration.  CAUSES   Vomiting.  Diarrhea.  Excessive sweating.  Excessive urine output.  Fever. SYMPTOMS  Mild dehydration  Thirst.  Dry lips.  Slightly dry mouth. Moderate dehydration  Very dry mouth.  Sunken eyes.  Skin does not bounce back quickly when lightly pinched and released.  Dark urine and decreased urine production.  Decreased tear production.  Headache. Severe dehydration  Very dry mouth.  Extreme thirst.  Rapid, weak pulse (more than 100 beats per minute at rest).  Cold hands and feet.  Not able to sweat in spite of heat and temperature.  Rapid breathing.  Blue lips.  Confusion and lethargy.  Difficulty being awakened.  Minimal urine production.  No tears. DIAGNOSIS  Your caregiver will diagnose dehydration based on your symptoms and your exam. Blood and urine tests will help confirm the diagnosis. The diagnostic evaluation should also identify the cause of dehydration. TREATMENT  Treatment of mild or moderate dehydration can often be done at home by increasing the amount of fluids that you drink. It is best to drink small amounts of fluid more often. Drinking too much at one time can make vomiting worse. Refer to the home care instructions below. Severe dehydration needs to be treated at the hospital where you will probably be given intravenous (IV) fluids that contain water and electrolytes. HOME CARE INSTRUCTIONS   Ask your caregiver about specific rehydration instructions.  Drink enough fluids to keep your urine clear or pale yellow.  Drink small amounts frequently if you have nausea and vomiting.  Eat as you normally do.  Avoid:  Foods or drinks high in sugar.  Carbonated  drinks.  Juice.  Extremely hot or cold fluids.  Drinks with caffeine.  Fatty, greasy foods.  Alcohol.  Tobacco.  Overeating.  Gelatin desserts.  Wash your hands well to avoid spreading bacteria and viruses.  Only take over-the-counter or prescription medicines for pain, discomfort, or fever as directed by your caregiver.  Ask your caregiver if you should continue all prescribed and over-the-counter medicines.  Keep all follow-up appointments with your caregiver. SEEK MEDICAL CARE IF:  You have abdominal pain and it increases or stays in one area (localizes).  You have a rash, stiff neck, or severe headache.  You are irritable, sleepy, or difficult to awaken.  You are weak, dizzy, or extremely thirsty. SEEK IMMEDIATE MEDICAL CARE IF:   You are unable to keep fluids down or you get worse despite treatment.  You have frequent episodes of vomiting or diarrhea.  You have blood or green matter (bile) in your vomit.  You have blood in your stool or your stool looks black and tarry.  You have not urinated in 6 to 8 hours, or you have only urinated a small amount of very dark urine.  You have a fever.  You faint. MAKE SURE YOU:   Understand these instructions.  Will watch your condition.  Will get help right away if you are not doing well or get worse. Document Released: 05/22/2005 Document Revised: 08/14/2011 Document Reviewed: 01/09/2011 Exit[employer] Patient Information 2015 [employer]. This information is not intended to replace advice given to you by your health care provider. Make sure you discuss any questions you have with your health care   provider.  

## 2013-12-18 ENCOUNTER — Other Ambulatory Visit: Payer: No Typology Code available for payment source

## 2013-12-18 ENCOUNTER — Ambulatory Visit (HOSPITAL_BASED_OUTPATIENT_CLINIC_OR_DEPARTMENT_OTHER): Payer: Medicaid Other | Admitting: Oncology

## 2013-12-18 VITALS — BP 113/70 | HR 88 | Temp 98.1°F | Resp 20 | Ht 64.0 in | Wt 176.0 lb

## 2013-12-18 DIAGNOSIS — C50419 Malignant neoplasm of upper-outer quadrant of unspecified female breast: Secondary | ICD-10-CM

## 2013-12-18 DIAGNOSIS — C50919 Malignant neoplasm of unspecified site of unspecified female breast: Secondary | ICD-10-CM

## 2013-12-18 DIAGNOSIS — E041 Nontoxic single thyroid nodule: Secondary | ICD-10-CM

## 2013-12-18 DIAGNOSIS — D869 Sarcoidosis, unspecified: Secondary | ICD-10-CM

## 2013-12-18 DIAGNOSIS — Z170 Estrogen receptor positive status [ER+]: Secondary | ICD-10-CM

## 2013-12-18 DIAGNOSIS — C50412 Malignant neoplasm of upper-outer quadrant of left female breast: Secondary | ICD-10-CM

## 2013-12-18 DIAGNOSIS — I10 Essential (primary) hypertension: Secondary | ICD-10-CM

## 2013-12-18 DIAGNOSIS — T451X5A Adverse effect of antineoplastic and immunosuppressive drugs, initial encounter: Secondary | ICD-10-CM

## 2013-12-18 DIAGNOSIS — C773 Secondary and unspecified malignant neoplasm of axilla and upper limb lymph nodes: Secondary | ICD-10-CM

## 2013-12-18 DIAGNOSIS — F17200 Nicotine dependence, unspecified, uncomplicated: Secondary | ICD-10-CM

## 2013-12-18 DIAGNOSIS — D6481 Anemia due to antineoplastic chemotherapy: Secondary | ICD-10-CM

## 2013-12-18 MED ORDER — ATENOLOL-CHLORTHALIDONE 50-25 MG PO TABS: 1.0000 | ORAL_TABLET | Freq: Every day | ORAL | Status: DC

## 2013-12-18 NOTE — Progress Notes (Signed)
[place]  [phone] [phone]     ID: Shelly Hall OB: [birth year]  MR#: [id number]  EYC#:[id number]  PCP: No PCP Per Patient GYN:  Shelly Hall SU: [name] OTHER MD: Thea Silversmith  CHIEF COMPLAINT:  Left Breast Cancer CURRENT TREATMENT: Neoadjuvant Chemotherapy  BREAST CANCER HISTORY: Shelly Hall palpated a mass in her left breast mid-April 2015 and immediately brought it to Dr. Benjie Karvonen' attention. She was set up for bilateral screening mammography with tomography at [facility] hospital 09/19/2013. The possible distortion in the left breast and a nearby group of calcifications was felt to warrant further evaluation, and on 09/30/2013 she underwent unilateral left diagnostic mammography and ultrasonography. There was an irregular mass in the outer left breast associated with microcalcifications. 4 cm anterior to this there was another cluster of pleomorphic calcifications spanning 7 mm. Shelly Hall had more anteriorly there was an irregular mass associated with other calcifications. In total the abnormalities in the left breast spent approximately 10 cm, and is segmental distribution. On exam there was a palpable firm mass at the 2:30 o'clock position of the left breast 10 cm from the nipple. There was palpable adenopathy in the inferior left axilla. Ultrasound confirmed an irregular hypoechoic mass in the left breast measuring 4.6 cm. In addition, a 1.4 cm oval slightly irregular mass was noted at the 3:00 position and yet another mass measured 9 mm in the same area. Ultrasound of the left axilla showed a dominant 3.6 cm lymph node.  Biopsy of 2 of the left breast masses and the suspicious left breast lymph node on 10/03/2013 showed ([id number]) an invasive ductal carcinoma, grade 2, estrogen receptor 90% positive with strong staining intensity, progesterone receptor 40% positive but moderate staining intensity, with an MIB-1 of 20% and no HER-2 amplification.  (Because all 3 biopsies were morphologically identical, only one prognostic panel was sent).  On 10/10/2013 the patient underwent bilateral breast MRI. This showed numerous enhancing masses in the left breast, the largest measuring 3.5 cm, and the aggregate measuring 12.7 cm. There were numerous enlarged left axillary lymph nodes, at least 5 of which were enlarged, both at levels 1 and 2. The largest measured 2.3 cm. The right breast was negative.  The patient's subsequent history is as detailed below.  INTERVAL HISTORY: Shelly Hall returns today for followup of her breast cancer accompanied by her mother. Since her last visit here she also underwent biopsy of her thyroid, which had "hot" on her original staging PET scan. This was a 10 mm left upper pole thyroid nodule. 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 The patient tolerated the procedure well.-- Today is day 8 cycle 4 of 4 planned cycles of cyclophosphamide and doxorubicin in dose dense fashion, with Neulasta support. The patient just had her restaging breast MRI, with very favorable results  REVIEW OF SYSTEMS: Shelly Hall tolerated her chemotherapy generally well. She has been fatigued, about this because of her chemotherapy induced anemia. She was also dehydrated and required some fluids yesterday her appetite is good for her taste is "changed those quotes and she does not enjoy eating as much as she did. She is occasionally slightly constipated. She is using stool softeners, Dulcolax and prune juice to deal with that. She has a mild dry cough and unfortunately continues to smoke. Otherwise a detailed review of systems today was noncontributory   PAST MEDICAL HISTORY: Past Medical History  Diagnosis Date  . Diabetes mellitus   . Hypertension   .  Sarcoidosis   . Anxiety   . Depression   . Anemia   . Complication of anesthesia     makes pt. restless and unable to be still  . Cancer     breast cancer    PAST  SURGICAL HISTORY: Past Surgical History  Procedure Laterality Date  . Tubal ligation  2001  . Endobronchial ultrasound Bilateral 12/02/2012    Procedure: ENDOBRONCHIAL ULTRASOUND;  Surgeon: Collene Gobble, MD;  Location: [facility];  Service: Cardiopulmonary;  Laterality: Bilateral;  . Breast surgery Left     breast biopsy times 3  . Portacath placement N/A 10/22/2013    Procedure: INSERTION PORT-A-CATH;  Surgeon: [name], MD;  Location: [facility] OR;  Service: General;  Laterality: N/A;    FAMILY HISTORY Family History  Problem Relation Age of Onset  . Cancer Paternal Aunt     "bone cancer"; deceased 46s  . Cancer Paternal Uncle     stomach cancer; deceased 81s  . Cancer Paternal Aunt     unk. primary; currently 56s  . Prostate cancer Maternal Grandfather 4  The patient's parents are living, in fair mid to late 7s. The patient had one brother, no sisters. There is no history of breast or ovarian cancer in the family to her knowledge.   GYNECOLOGIC HISTORY:   (Reviewed 11/27/2013)  menarche age 47, first live birth at 32. She is GX P2. She was still having regular periods at the time of the start of her chemotherapy in May 2015.  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 She is single and lives alone, although her mother is currently staying with her. Her son Nolah Krenzer lives in [place] and works at [employer]. Son [name] lives in [place] where Shelly Hall works at a [employer]. The patient has 1 grandchild on the way. She is a Psychologist, forensic.    ADVANCED DIRECTIVES: not in place   HEALTH MAINTENANCE: (Updated 11/27/2013) History  Substance Use Topics  . Smoking status: Former Smoker -- 1.00 packs/day for 23 years    Types: Cigarettes    Quit date: 10/19/2013  . Smokeless tobacco: Never Used  . Alcohol Use: 1.0 oz/week    2 drink(s) per week     Comment: occ. x2 dks per week     Colonoscopy: Never  PAP: Not on  file  Bone density: Not on file  Lipid panel:  Not on file  Allergies  Allergen Reactions  . Nyquil Multi-Symptom [Pseudoeph-Doxylamine-Dm-Apap] Itching and Other (See Comments)    Skin peels on hands, and feet    Current Outpatient Prescriptions  Medication Sig Dispense Refill  . acyclovir (ZOVIRAX) 400 MG tablet Take 1 tablet (400 mg total) by mouth 2 (two) times daily. Beginning 11/12/2013  60 tablet  4  . albuterol (PROVENTIL HFA;VENTOLIN HFA) 108 (90 BASE) MCG/ACT inhaler Inhale 2 puffs into the lungs every 6 (six) hours as needed for wheezing or shortness of breath.      Marland Kitchen atenolol-chlorthalidone (TENORETIC) 50-25 MG per tablet Take 1 tablet by mouth daily.  30 tablet  1  . ciprofloxacin (CIPRO) 500 MG tablet Take 1 tablet (500 mg total) by mouth 2 (two) times daily.  14 tablet  0  . dexamethasone (DECADRON) 4 MG tablet Take 2 tablets by mouth once a day on the day after chemotherapy and then take 2 tablets two times a day for 2 days. Take with food.  30 tablet  1  .  docusate sodium (COLACE) 100 MG capsule Take 100 mg by mouth daily.      . ferrous sulfate 325 (65 FE) MG tablet Take 325 mg by mouth 2 (two) times daily with a meal.      . HYDROcodone-ibuprofen (VICOPROFEN) 7.5-200 MG per tablet       . lidocaine-prilocaine (EMLA) cream Apply 1 application topically as needed. Apply over port area 1-2 hours before chemo and cover with plastic wrap  30 g  0  . Loratadine 10 MG CAPS Take 1 capsule (10 mg total) by mouth as directed.  30 each  3  . LORazepam (ATIVAN) 0.5 MG tablet Take 1 tablet (0.5 mg total) by mouth at bedtime as needed (Nausea or vomiting).  30 tablet  0  . metFORMIN (GLUCOPHAGE) 500 MG tablet Take 1 tablet (500 mg total) by mouth 2 (two) times daily with a meal.  60 tablet  1  . Multiple Vitamin (MULTIVITAMIN WITH MINERALS) TABS tablet Take 1 tablet by mouth daily.      Marland Kitchen oxyCODONE (OXY IR/ROXICODONE) 5 MG immediate release tablet Take 1 tablet (5 mg total) by mouth  every 4 (four) hours as needed for severe pain.  30 tablet  0  . pantoprazole (PROTONIX) 40 MG tablet Take 1 tablet (40 mg total) by mouth daily.  30 tablet  5  . potassium chloride SA (K-DUR,KLOR-CON) 20 MEQ tablet Take 1 tablet (20 mEq total) by mouth daily.  30 tablet  6  . prochlorperazine (COMPAZINE) 10 MG tablet Take 1 tablet (10 mg total) by mouth every 6 (six) hours as needed (Nausea or vomiting).  30 tablet  1   No current facility-administered medications for this visit.    OBJECTIVE: Middle-aged [place] American woman in no acute distress Filed Vitals:   12/18/13 1647  BP: 113/70  Pulse: 88  Temp: 98.1 F (36.7 C)  Resp: 20     Body mass index is 30.2 kg/(m^2).    ECOG FS:1 - Symptomatic but completely ambulatory Filed Weights   12/18/13 1647  Weight: 176 lb (79.833 kg)   Sclerae unicteric, pupils equal and reactive Oropharynx clear and moist; no thyromegaly palpated No cervical or supraclavicular adenopathy Lungs no rales or rhonchi Heart regular rate and rhythm Abd soft, nontender, positive bowel sounds MSK no focal spinal tenderness, no upper extremity lymphedema Neuro: nonfocal, well oriented, appropriate affect Breasts: Deferred  LAB RESULTS:   Lab Results  Component Value Date   WBC 1.1* 12/17/2013   NEUTROABS 0.7* 12/17/2013   HGB 8.6* 12/17/2013   HCT 26.6* 12/17/2013   MCV 86.3 12/17/2013   PLT 296 12/17/2013      Chemistry      Component Value Date/Time   NA 137 12/17/2013 1041   NA 140 10/21/2013 1158   K 3.8 12/17/2013 1041   K 3.9 10/21/2013 1158   CL 105 10/21/2013 1158   CO2 24 12/17/2013 1041   CO2 21 10/21/2013 1158   BUN 18.3 12/17/2013 1041   BUN 16 10/21/2013 1158   CREATININE 1.0 12/17/2013 1041   CREATININE 0.95 10/21/2013 1158      Component Value Date/Time   CALCIUM 9.7 12/17/2013 1041   CALCIUM 9.9 10/21/2013 1158   ALKPHOS 98 12/17/2013 1041   ALKPHOS 81 11/27/2012 0900   AST 16 12/17/2013 1041   AST 22 11/27/2012 0900   ALT 15 12/17/2013  1041   ALT 17 11/27/2012 0900   BILITOT 0.27 12/17/2013 1041   BILITOT 0.1* 11/27/2012 0900  STUDIES:  Echocardiogram on 10/29/2013 showed an ejection fraction of 55-60%. Mr Breast Bilateral W Wo Contrast  12/16/2013   CLINICAL DATA:  Patient presented with a palpable mass in the left breast in April, 2015, multiple masses in the outer left breast by ultrasound. 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 A mass at the 3 o'clock position 3 cm from the nipple measured, biopsy proven invasive ductal carcinoma and DCIS. A 1.4 cm mass at the 3 o'clock position 7 cm from the nipple was not biopsied. Biopsy of a palpable pathologic left axillary lymph node revealed metastatic invasive ductal carcinoma. Pre-treatment MRI demonstrated multiple enhancing masses in the outer left breast in a segmental distribution with a total measurement of 12.7 x 4.5 x 3.8 cm, as well as multiple pathologic left axillary lymph nodes.  Patient has undergone neoadjuvant chemotherapy since diagnosis. MRI requested to evaluate treatment response.  LABS:  Not applicable.  EXAM: BILATERAL BREAST MRI WITH AND WITHOUT CONTRAST  TECHNIQUE: Multiplanar, multisequence MR images of both breasts were obtained prior to and following the intravenous administration of 16 ml of Multihance.  THREE-DIMENSIONAL MR IMAGE RENDERING ON INDEPENDENT WORKSTATION:  Three-dimensional MR images were rendered by post-processing of the original MR data on an independent workstation. The three-dimensional MR images were interpreted, and findings are reported in the following complete MRI report for this study. Three dimensional images were evaluated at the independent DynaCad workstation.  COMPARISON:  Bilateral breast MRI 10/10/2013. Mammography 10/03/2013, 09/30/2013, dating back to 04/08/2009. Left breast ultrasound 10/03/2013, 09/30/2013.  FINDINGS: Breast  composition: b.  Scattered fibroglandular tissue.  Background parenchymal enhancement: Mild.  Right breast: No suspicious mass or abnormal enhancement.  Left breast: Interval reduction in the overall volume of the segmental enhancing tumor in the lower outer left breast. (For the purposes of the measurements that I will give, I measured the segmental enhancement on the DynaCAD workstation at similar levels on the previous and the current MRI, so my measurements for the prior examination will be different than originally stated in that report).  The overall AP length of the segmental enhancement is unchanged at approximately 13 cm. However, there has been marked reduction in tumor volume, with the maximum width currently approximating 3.5 cm (previously 4.8 cm by my measurement), and the maximum height currently approximating 4.3 cm (previously 5.9 cm by my measurement). In addition, the enhancement kinetics of the segmental tumor have improved.  No new or suspicious findings elsewhere in the left breast.  Lymph nodes: Interval marked reduction in size of the previously identified left axillary lymph nodes, the largest node which previously measured approximately 2.8 cm currently measuring approximately 1.7 cm, with more normal-appearing morphology. No new or enlarging lymphadenopathy.  Ancillary findings:  None.  IMPRESSION: 1. Interval marked reduction in volume of the segmental enhancing tumor in the lower outer left breast, consistent with a good response to chemotherapy. Measurements are given above. 2. Interval marked reduction in size of the previously identified left axillary lymph nodes. 3. No new or suspicious findings elsewhere in the left breast. 4. No findings suspicious for malignancy in the right breast.  RECOMMENDATION: Treatment plan.  BI-RADS CATEGORY  6: Known biopsy-proven malignancy.   Electronically Signed   By: Evangeline Dakin M.D.   On: 12/16/2013 15:50   US Thyroid Biopsy  11/25/2013    CLINICAL DATA:  Breast cancer.  Left thyroid nodule positive on PET.  EXAM: ULTRASOUND GUIDED NEEDLE ASPIRATE BIOPSY OF THE THYROID GLAND  COMPARISON:  None.  PROCEDURE: Thyroid biopsy was thoroughly discussed with the patient and questions were answered. The benefits, risks, alternatives, and complications were also discussed. The patient understands and wishes to proceed with the procedure. Written consent was obtained.  Ultrasound was performed to localize and mark an adequate site for the biopsy. The patient was then prepped and draped in a normal sterile fashion. Local anesthesia was provided with 1% lidocaine. Using direct ultrasound guidance, 4 passes were made using needles into the nodule within the left lobe of the thyroid. Ultrasound was used to confirm needle placements on all occasions. Specimens were sent to Pathology for analysis.  Complications:  None.  FINDINGS: Images document needle placement in the nodule in the left thyroid gland.  IMPRESSION: Ultrasound guided needle aspirate biopsy performed of the left thyroid nodule.   Electronically Signed   By: Maryclare Bean M.D.   On: 11/25/2013 10:06   Mr Attempted Daymon Larsen Report  12/15/2013   This examination belongs to an outside facility and is stored  here for comparison purposes only.  Contact the originating outside  institution for any associated report or interpretation.   ASSESSMENT: 43 y.o. BRCA negative [place] woman   (1)  status post left breast and left axillary lymph node biopsy 10/03/2013, both positive for a clinical T2 N2, stage IIIA invasive ductal carcinoma, grade 2, estrogen and progesterone receptor positive, HER-2 negative, with an MIB-1 of 20%.  (2) Treated with neoadjuvant chemotherapy to consist of doxorubicin and cyclophosphamide in dose dense fashion x4, completed 12/11/2013, to be followed by paclitaxel weekly x12  (2) definitive surgery to follow chemotherapy  (3) radiation to follow surgery  (4) tamoxifen to  be started at the end of radiation  (5) continuing tobacco abuse: The patient has been strongly advised to discontinue smoking  (6) genetic testing sent 10/16/2013 did not reveal any clearly pathogenic (harmful) mutation in any of these genes. The genes tested were ATM, BARD1, BRCA1, BRCA2, BRIP1, CDH1, CHEK2, MRE11A, MUTYH, NBN, NF1, PALB2, PTEN, RAD50, RAD51C, RAD51D, and TP53.  (7) biopsy of the left thyroid nodule 89/37/3428 showed a follicular neoplasm   PLAN:  We spent approximately 50 minutes going over Meriah's situation today. She tolerated the first part of her chemotherapy generally well, with anemia as the main complication. Hopefully that will improve as she starts her weekly Taxol treatments, which are much less marrow suppressive.  She has had a very good initial response. I am not sure if she will get to a complete pathologic response, since estrogen receptor positive patients seldom do. In any case, the fact that she has thyroid cancer probably will make her ineligible for both our surgical trials (the Alliance trial and the NSABP B. 51 trial).  She will meet with her surgeon next week, and then resume chemotherapy with weekly Taxol for the next 12 weeks. Today we discussed in detail the possible toxicities, side effects and complications of treatment with special attention to the risk of neuropathy. We will be asking her on a weekly basis if this symptom is developing and she will let us know as well.  I have again strongly encouraged the patient to discontinue smoking  Rumaisa has a good understanding of the overall plan. She agrees with it. She knows a goal of treatment in her case is cure. She will call with any problems that may develop before the next visit here.  Chauncey Cruel, MD  12/18/2013 5:58 PM

## 2013-12-22 ENCOUNTER — Telehealth: Payer: Self-pay | Admitting: Oncology

## 2013-12-22 NOTE — Telephone Encounter (Signed)
s.w. pt and advised on July appt.Marland KitchenMarland Kitchenpt will pick up new sched at visit

## 2013-12-23 ENCOUNTER — Ambulatory Visit (INDEPENDENT_AMBULATORY_CARE_PROVIDER_SITE_OTHER): Payer: No Typology Code available for payment source | Admitting: General Surgery

## 2013-12-23 ENCOUNTER — Encounter (INDEPENDENT_AMBULATORY_CARE_PROVIDER_SITE_OTHER): Payer: Self-pay | Admitting: General Surgery

## 2013-12-23 VITALS — BP 122/80 | HR 88 | Temp 97.5°F | Ht 64.0 in | Wt 184.0 lb

## 2013-12-23 DIAGNOSIS — C50419 Malignant neoplasm of upper-outer quadrant of unspecified female breast: Secondary | ICD-10-CM

## 2013-12-23 DIAGNOSIS — D497 Neoplasm of unspecified behavior of endocrine glands and other parts of nervous system: Secondary | ICD-10-CM

## 2013-12-23 DIAGNOSIS — C50919 Malignant neoplasm of unspecified site of unspecified female breast: Secondary | ICD-10-CM

## 2013-12-23 DIAGNOSIS — C50912 Malignant neoplasm of unspecified site of left female breast: Secondary | ICD-10-CM

## 2013-12-23 DIAGNOSIS — C50412 Malignant neoplasm of upper-outer quadrant of left female breast: Secondary | ICD-10-CM

## 2013-12-23 NOTE — Patient Instructions (Signed)
We will schedule a seed localized partial mastectomy (lumpectomy) with axillary lymph node dissection and left thyroid lobectomy for early November.    I need to see you back right after your next MRI.

## 2013-12-25 ENCOUNTER — Encounter: Payer: Self-pay | Admitting: Physician Assistant

## 2013-12-25 ENCOUNTER — Ambulatory Visit (HOSPITAL_BASED_OUTPATIENT_CLINIC_OR_DEPARTMENT_OTHER): Payer: Medicaid Other | Admitting: Physician Assistant

## 2013-12-25 ENCOUNTER — Ambulatory Visit (HOSPITAL_BASED_OUTPATIENT_CLINIC_OR_DEPARTMENT_OTHER): Payer: Medicaid Other

## 2013-12-25 ENCOUNTER — Telehealth: Payer: Self-pay | Admitting: Oncology

## 2013-12-25 ENCOUNTER — Other Ambulatory Visit (HOSPITAL_BASED_OUTPATIENT_CLINIC_OR_DEPARTMENT_OTHER): Payer: Medicaid Other

## 2013-12-25 VITALS — BP 136/78 | HR 83 | Temp 97.0°F | Resp 20

## 2013-12-25 VITALS — BP 156/98 | HR 84 | Temp 98.3°F | Resp 19 | Ht 64.0 in | Wt 183.5 lb

## 2013-12-25 DIAGNOSIS — Z5111 Encounter for antineoplastic chemotherapy: Secondary | ICD-10-CM

## 2013-12-25 DIAGNOSIS — R21 Rash and other nonspecific skin eruption: Secondary | ICD-10-CM

## 2013-12-25 DIAGNOSIS — C50419 Malignant neoplasm of upper-outer quadrant of unspecified female breast: Secondary | ICD-10-CM

## 2013-12-25 DIAGNOSIS — Z170 Estrogen receptor positive status [ER+]: Secondary | ICD-10-CM

## 2013-12-25 DIAGNOSIS — F17200 Nicotine dependence, unspecified, uncomplicated: Secondary | ICD-10-CM

## 2013-12-25 DIAGNOSIS — C50919 Malignant neoplasm of unspecified site of unspecified female breast: Secondary | ICD-10-CM

## 2013-12-25 DIAGNOSIS — C50412 Malignant neoplasm of upper-outer quadrant of left female breast: Secondary | ICD-10-CM

## 2013-12-25 LAB — COMPREHENSIVE METABOLIC PANEL (CC13)
ALBUMIN: 3.4 g/dL — AB (ref 3.5–5.0)
ALT: 14 U/L (ref 0–55)
AST: 15 U/L (ref 5–34)
Alkaline Phosphatase: 68 U/L (ref 40–150)
Anion Gap: 8 mEq/L (ref 3–11)
BUN: 13 mg/dL (ref 7.0–26.0)
CHLORIDE: 106 meq/L (ref 98–109)
CO2: 26 mEq/L (ref 22–29)
Calcium: 9.3 mg/dL (ref 8.4–10.4)
Creatinine: 1 mg/dL (ref 0.6–1.1)
Glucose: 152 mg/dl — ABNORMAL HIGH (ref 70–140)
POTASSIUM: 4.2 meq/L (ref 3.5–5.1)
SODIUM: 140 meq/L (ref 136–145)
TOTAL PROTEIN: 6.9 g/dL (ref 6.4–8.3)
Total Bilirubin: <0.2 mg/dL (ref 0.20–1.20)

## 2013-12-25 LAB — CBC WITH DIFFERENTIAL/PLATELET
BASO%: 0.4 % (ref 0.0–2.0)
Basophils Absolute: 0 10*3/uL (ref 0.0–0.1)
EOS%: 0.7 % (ref 0.0–7.0)
Eosinophils Absolute: 0.1 10*3/uL (ref 0.0–0.5)
HCT: 25.8 % — ABNORMAL LOW (ref 34.8–46.6)
HGB: 8.2 g/dL — ABNORMAL LOW (ref 11.6–15.9)
LYMPH#: 0.7 10*3/uL — AB (ref 0.9–3.3)
LYMPH%: 9 % — ABNORMAL LOW (ref 14.0–49.7)
MCH: 28.4 pg (ref 25.1–34.0)
MCHC: 31.8 g/dL (ref 31.5–36.0)
MCV: 89.3 fL (ref 79.5–101.0)
MONO#: 1.1 10*3/uL — ABNORMAL HIGH (ref 0.1–0.9)
MONO%: 13.1 % (ref 0.0–14.0)
NEUT%: 76.8 % (ref 38.4–76.8)
NEUTROS ABS: 6.2 10*3/uL (ref 1.5–6.5)
Platelets: 207 10*3/uL (ref 145–400)
RBC: 2.89 10*6/uL — AB (ref 3.70–5.45)
RDW: 22 % — ABNORMAL HIGH (ref 11.2–14.5)
WBC: 8 10*3/uL (ref 3.9–10.3)

## 2013-12-25 MED ORDER — DIPHENHYDRAMINE HCL 50 MG/ML IJ SOLN
INTRAMUSCULAR | Status: AC
  Filled 2013-12-25: qty 1

## 2013-12-25 MED ORDER — LORAZEPAM 0.5 MG PO TABS: 0.5000 mg | ORAL_TABLET | Freq: Every evening | ORAL | Status: DC | PRN

## 2013-12-25 MED ORDER — ONDANSETRON 8 MG/50ML IVPB (CHCC)
8.0000 mg | Freq: Once | INTRAVENOUS | Status: AC
  Administered 2013-12-25: 8 mg via INTRAVENOUS

## 2013-12-25 MED ORDER — DOXYCYCLINE HYCLATE 100 MG PO TABS: 100.0000 mg | ORAL_TABLET | Freq: Every day | ORAL | Status: DC

## 2013-12-25 MED ORDER — FAMOTIDINE IN NACL 20-0.9 MG/50ML-% IV SOLN
INTRAVENOUS | Status: AC
  Filled 2013-12-25: qty 50

## 2013-12-25 MED ORDER — ONDANSETRON HCL 8 MG PO TABS: 8.0000 mg | ORAL_TABLET | Freq: Two times a day (BID) | ORAL | Status: DC

## 2013-12-25 MED ORDER — ONDANSETRON 8 MG/NS 50 ML IVPB
INTRAVENOUS | Status: AC
  Filled 2013-12-25: qty 8

## 2013-12-25 MED ORDER — SODIUM CHLORIDE 0.9 % IJ SOLN
10.0000 mL | INTRAMUSCULAR | Status: DC | PRN
  Administered 2013-12-25: 10 mL
  Filled 2013-12-25: qty 10

## 2013-12-25 MED ORDER — DEXAMETHASONE 4 MG PO TABS: 8.0000 mg | ORAL_TABLET | Freq: Two times a day (BID) | ORAL | Status: DC

## 2013-12-25 MED ORDER — DIPHENHYDRAMINE HCL 50 MG/ML IJ SOLN
25.0000 mg | Freq: Once | INTRAMUSCULAR | Status: AC
  Administered 2013-12-25: 25 mg via INTRAVENOUS

## 2013-12-25 MED ORDER — DEXAMETHASONE SODIUM PHOSPHATE 20 MG/5ML IJ SOLN
20.0000 mg | Freq: Once | INTRAMUSCULAR | Status: AC
  Administered 2013-12-25: 20 mg via INTRAVENOUS

## 2013-12-25 MED ORDER — DEXTROSE 5 % IV SOLN
80.0000 mg/m2 | Freq: Once | INTRAVENOUS | Status: AC
  Administered 2013-12-25: 150 mg via INTRAVENOUS
  Filled 2013-12-25: qty 25

## 2013-12-25 MED ORDER — SODIUM CHLORIDE 0.9 % IV SOLN
Freq: Once | INTRAVENOUS | Status: AC
  Administered 2013-12-25: 12:00:00 via INTRAVENOUS

## 2013-12-25 MED ORDER — HEPARIN SOD (PORK) LOCK FLUSH 100 UNIT/ML IV SOLN
500.0000 [IU] | Freq: Once | INTRAVENOUS | Status: AC | PRN
  Administered 2013-12-25: 500 [IU]
  Filled 2013-12-25: qty 5

## 2013-12-25 MED ORDER — DEXAMETHASONE SODIUM PHOSPHATE 20 MG/5ML IJ SOLN
INTRAMUSCULAR | Status: AC
  Filled 2013-12-25: qty 5

## 2013-12-25 MED ORDER — FAMOTIDINE IN NACL 20-0.9 MG/50ML-% IV SOLN
20.0000 mg | Freq: Once | INTRAVENOUS | Status: AC
  Administered 2013-12-25: 20 mg via INTRAVENOUS

## 2013-12-25 NOTE — Progress Notes (Signed)
Pt here for first time Taxol after office visit.  Pt tolerated Taxol infusion per first time infusion protocol without problems.  Pt completed Taxol infusion at 1440.  At this time, pt complained of mild short of breath, nurse noted some dry cough.  Normal saline infused post Taxol.  VSs taken and documented.  Pt remained A&O x 3; answered questions appropriately.  Mother at chair side.  Pt was monitored very closely for approximately 1 hour post chemo with normal saline infusing.  1500 -  Pt stated completed relief of short of breath; no coughing noted.  Pt ambulated to bathroom steady by self.   Pt was stable at discharge with mother at 82.

## 2013-12-25 NOTE — Patient Instructions (Signed)
[facility] Discharge Instructions for Patients Receiving Chemotherapy  Today you received the following chemotherapy agents :  Taxol.  To help prevent nausea and vomiting after your treatment, we encourage you to take your nausea medication as specifically instructed by Dr. Jana Hakim.   If you develop nausea and vomiting that is not controlled by your nausea medication, call the clinic.   BELOW ARE SYMPTOMS THAT SHOULD BE REPORTED IMMEDIATELY:  *FEVER GREATER THAN 100.5 F  *CHILLS WITH OR WITHOUT FEVER  NAUSEA AND VOMITING THAT IS NOT CONTROLLED WITH YOUR NAUSEA MEDICATION  *UNUSUAL SHORTNESS OF BREATH  *UNUSUAL BRUISING OR BLEEDING  TENDERNESS IN MOUTH AND THROAT WITH OR WITHOUT PRESENCE OF ULCERS  *URINARY PROBLEMS  *BOWEL PROBLEMS  UNUSUAL RASH Items with * indicate a potential emergency and should be followed up as soon as possible.  Feel free to call the clinic you have any questions or concerns. The clinic phone number is [phone].

## 2013-12-25 NOTE — Progress Notes (Signed)
[facility]  [phone] [phone]     ID: Shelly Hall OB: 20-Sep-1970  MR#: [id number]  GRN#:[id number]  PCP: No PCP Per Patient GYN:  Shelly Hall SU: Shelly Hall OTHER MD: Shelly Hall  CHIEF COMPLAINT:  Left Breast Cancer CURRENT TREATMENT: Neoadjuvant Chemotherapy  BREAST CANCER HISTORY: Shelly Hall palpated a mass in her left breast mid-April 2015 and immediately brought it to Dr. Juliene Pina' attention. She was set up for bilateral screening mammography with tomography at [facility] hospital 09/19/2013. The possible distortion in the left breast and a nearby group of calcifications was felt to warrant further evaluation, and on 09/30/2013 she underwent unilateral left diagnostic mammography and ultrasonography. There was an irregular mass in the outer left breast associated with microcalcifications. 4 cm anterior to this there was another cluster of pleomorphic calcifications spanning 7 mm. Shelly Hall had more anteriorly there was an irregular mass associated with other calcifications. In total the abnormalities in the left breast spent approximately 10 cm, and is segmental distribution. On exam there was a palpable firm mass at the 2:30 o'clock position of the left breast 10 cm from the nipple. There was palpable adenopathy in the inferior left axilla. Ultrasound confirmed an irregular hypoechoic mass in the left breast measuring 4.6 cm. In addition, a 1.4 cm oval slightly irregular mass was noted at the 3:00 position and yet another mass measured 9 mm in the same area. Ultrasound of the left axilla showed a dominant 3.6 cm lymph node.  Biopsy of 2 of the left breast masses and the suspicious left breast lymph node on 10/03/2013 showed ([id number]) an invasive ductal carcinoma, grade 2, estrogen receptor 90% positive with strong staining intensity, progesterone receptor 40% positive but moderate staining intensity, with an MIB-1 of 20% and no HER-2 amplification.  (Because all 3 biopsies were morphologically identical, only one prognostic panel was sent).  On 10/10/2013 the patient underwent bilateral breast MRI. This showed numerous enhancing masses in the left breast, the largest measuring 3.5 cm, and the aggregate measuring 12.7 cm. There were numerous enlarged left axillary lymph nodes, at least 5 of which were enlarged, both at levels 1 and 2. The largest measured 2.3 cm. The right breast was negative.  The patient's subsequent history is as detailed below.  INTERVAL HISTORY: Shelly Hall returns today for followup of her breast cancer accompanied by her mother. She complains of fine and "bumps" on her face primarily in the region of her nose, cheeks and chin as well as above her upper lip. She first noticed this rash about 2 weeks ago. She requests a refill for her Ativan.She presents to proceed with the start of her weekly Taxol. The patient had her restaging breast MRI, with very favorable results  REVIEW OF SYSTEMS: Shelly Hall tolerated her previous chemotherapy generally well. With the exception of the skin rash she voices no other complaints. She uses Ativan as needed for nausea. She does report some fatigue. Remainder of the review of systems today was noncontributory   PAST MEDICAL HISTORY: Past Medical History  Diagnosis Date  . Diabetes mellitus   . Hypertension   . Sarcoidosis   . Anxiety   . Depression   . Anemia   . Complication of anesthesia     makes pt. restless and unable to be still  . Cancer     breast cancer    PAST SURGICAL HISTORY: Past Surgical History  Procedure Laterality Date  . Tubal ligation  2001  . Endobronchial ultrasound  Bilateral 12/02/2012    Procedure: ENDOBRONCHIAL ULTRASOUND;  Surgeon: Collene Gobble, MD;  Location: [facility];  Service: Cardiopulmonary;  Laterality: Bilateral;  . Breast surgery Left     breast biopsy times 3  . Portacath placement N/A 10/22/2013    Procedure: INSERTION PORT-A-CATH;   Surgeon: [name], MD;  Location: [facility] OR;  Service: General;  Laterality: N/A;    FAMILY HISTORY Family History  Problem Relation Age of Onset  . Cancer Paternal Aunt     "bone cancer"; deceased 64s  . Cancer Paternal Uncle     stomach cancer; deceased 22s  . Cancer Paternal Aunt     unk. primary; currently 55s  . Prostate cancer Maternal Grandfather 8  The patient's parents are living, in fair mid to late 23s. The patient had one brother, no sisters. There is no history of breast or ovarian cancer in the family to her knowledge.   GYNECOLOGIC HISTORY:   (Reviewed 11/27/2013)  menarche age 67, first live birth at 66. She is GX P2. She was still having regular periods at the time of the start of her chemotherapy in May 2015.  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 She is single and lives alone, although her mother is currently staying with her. Her son Shelly Hall lives in [place] and works at [employer]. Son Shelly Hall lives in [place] where Shelly Hall works at a [employer]. The patient has 1 grandchild on the way. She is a Psychologist, forensic.    ADVANCED DIRECTIVES: not in place   HEALTH MAINTENANCE: (Updated 11/27/2013) History  Substance Use Topics  . Smoking status: Former Smoker -- 1.00 packs/day for 23 years    Types: Cigarettes    Quit date: 10/19/2013  . Smokeless tobacco: Never Used  . Alcohol Use: 1.0 oz/week    2 drink(s) per week     Comment: occ. x2 dks per week     Colonoscopy: Never  PAP: Not on file  Bone density: Not on file  Lipid panel:  Not on file  Allergies  Allergen Reactions  . Nyquil Multi-Symptom [Pseudoeph-Doxylamine-Dm-Apap] Itching and Other (See Comments)    Skin peels on hands, and feet    Current Outpatient Prescriptions  Medication Sig Dispense Refill  . acyclovir (ZOVIRAX) 400 MG tablet Take 1 tablet (400 mg total) by mouth 2 (two) times daily. Beginning 11/12/2013  60 tablet   4  . albuterol (PROVENTIL HFA;VENTOLIN HFA) 108 (90 BASE) MCG/ACT inhaler Inhale 2 puffs into the lungs every 6 (six) hours as needed for wheezing or shortness of breath.      Marland Kitchen atenolol-chlorthalidone (TENORETIC) 50-25 MG per tablet Take 1 tablet by mouth daily.  30 tablet  1  . docusate sodium (COLACE) 100 MG capsule Take 100 mg by mouth daily.      . ferrous sulfate 325 (65 FE) MG tablet Take 325 mg by mouth 2 (two) times daily with a meal.      . HYDROcodone-ibuprofen (VICOPROFEN) 7.5-200 MG per tablet       . lidocaine-prilocaine (EMLA) cream Apply 1 application topically as needed. Apply over port area 1-2 hours before chemo and cover with plastic wrap  30 g  0  . Loratadine 10 MG CAPS Take 1 capsule (10 mg total) by mouth as directed.  30 each  3  . metFORMIN (GLUCOPHAGE) 500 MG tablet Take 1 tablet (500 mg total) by mouth 2 (two) times daily with a meal.  60 tablet  1  . Multiple Vitamin (MULTIVITAMIN WITH MINERALS) TABS tablet Take 1 tablet by mouth daily.      Marland Kitchen oxyCODONE (OXY IR/ROXICODONE) 5 MG immediate release tablet Take 1 tablet (5 mg total) by mouth every 4 (four) hours as needed for severe pain.  30 tablet  0  . pantoprazole (PROTONIX) 40 MG tablet Take 1 tablet (40 mg total) by mouth daily.  30 tablet  5  . prochlorperazine (COMPAZINE) 10 MG tablet Take 1 tablet (10 mg total) by mouth every 6 (six) hours as needed (Nausea or vomiting).  30 tablet  1  . dexamethasone (DECADRON) 4 MG tablet Take 2 tablets (8 mg total) by mouth 2 (two) times daily with a meal. Start the day after chemotherapy for 2 days. Take with food.  30 tablet  1  . doxycycline (VIBRA-TABS) 100 MG tablet Take 1 tablet (100 mg total) by mouth daily.  10 tablet  0  . LORazepam (ATIVAN) 0.5 MG tablet Take 1 tablet (0.5 mg total) by mouth at bedtime as needed (Nausea or vomiting).  30 tablet  0  . ondansetron (ZOFRAN) 8 MG tablet Take 1 tablet (8 mg total) by mouth 2 (two) times daily. Start the day after chemo for 2  days. Then take as needed for nausea or vomiting.  30 tablet  1  . potassium chloride SA (K-DUR,KLOR-CON) 20 MEQ tablet Take 1 tablet (20 mEq total) by mouth daily.  30 tablet  6   No current facility-administered medications for this visit.   Facility-Administered Medications Ordered in Other Visits  Medication Dose Route Frequency Provider Last Rate Last Dose  . sodium chloride 0.9 % injection 10 mL  10 mL Intracatheter PRN Chauncey Cruel, MD   10 mL at 12/25/13 1535    OBJECTIVE: Middle-aged [place] American woman in no acute distress Filed Vitals:   12/25/13 1002  BP: 156/98  Pulse: 84  Temp: 98.3 F (36.8 C)  Resp: 19     Body mass index is 31.48 kg/(m^2).    ECOG FS:1 - Symptomatic but completely ambulatory Filed Weights   12/25/13 1002  Weight: 183 lb 8 oz (83.235 kg)   Sclerae unicteric, pupils equal and reactive Oropharynx clear and moist; no thyromegaly palpated No cervical or supraclavicular adenopathy Lungs no rales or rhonchi Heart regular rate and rhythm Abd soft, nontender, positive bowel sounds MSK no focal spinal tenderness, no upper extremity lymphedema Neuro: nonfocal, well oriented, appropriate affect Breasts: Deferred Skin: fine papular eruptions affecting the nose, above the upper lip, cheeks and chin  LAB RESULTS:   Lab Results  Component Value Date   WBC 8.0 12/25/2013   NEUTROABS 6.2 12/25/2013   HGB 8.2* 12/25/2013   HCT 25.8* 12/25/2013   MCV 89.3 12/25/2013   PLT 207 12/25/2013      Chemistry      Component Value Date/Time   NA 140 12/25/2013 0933   NA 140 10/21/2013 1158   K 4.2 12/25/2013 0933   K 3.9 10/21/2013 1158   CL 105 10/21/2013 1158   CO2 26 12/25/2013 0933   CO2 21 10/21/2013 1158   BUN 13.0 12/25/2013 0933   BUN 16 10/21/2013 1158   CREATININE 1.0 12/25/2013 0933   CREATININE 0.95 10/21/2013 1158      Component Value Date/Time   CALCIUM 9.3 12/25/2013 0933   CALCIUM 9.9 10/21/2013 1158   ALKPHOS 68 12/25/2013 0933   ALKPHOS 81  11/27/2012 0900   AST 15 12/25/2013  0933   AST 22 11/27/2012 0900   ALT 14 12/25/2013 0933   ALT 17 11/27/2012 0900   BILITOT <0.20 12/25/2013 0933   BILITOT 0.1* 11/27/2012 0900        STUDIES:  Echocardiogram on 10/29/2013 showed an ejection fraction of 55-60%. Mr Breast Bilateral W Wo Contrast  12/16/2013   CLINICAL DATA:  Patient presented with a palpable mass in the left breast in April, 2015, multiple masses in the outer left breast by ultrasound. 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 A mass at the 3 o'clock position 3 cm from the nipple measured, biopsy proven invasive ductal carcinoma and DCIS. A 1.4 cm mass at the 3 o'clock position 7 cm from the nipple was not biopsied. Biopsy of a palpable pathologic left axillary lymph node revealed metastatic invasive ductal carcinoma. Pre-treatment MRI demonstrated multiple enhancing masses in the outer left breast in a segmental distribution with a total measurement of 12.7 x 4.5 x 3.8 cm, as well as multiple pathologic left axillary lymph nodes.  Patient has undergone neoadjuvant chemotherapy since diagnosis. MRI requested to evaluate treatment response.  LABS:  Not applicable.  EXAM: BILATERAL BREAST MRI WITH AND WITHOUT CONTRAST  TECHNIQUE: Multiplanar, multisequence MR images of both breasts were obtained prior to and following the intravenous administration of 16 ml of Multihance.  THREE-DIMENSIONAL MR IMAGE RENDERING ON INDEPENDENT WORKSTATION:  Three-dimensional MR images were rendered by post-processing of the original MR data on an independent workstation. The three-dimensional MR images were interpreted, and findings are reported in the following complete MRI report for this study. Three dimensional images were evaluated at the independent DynaCad workstation.  COMPARISON:  Bilateral breast MRI 10/10/2013. Mammography 10/03/2013, 09/30/2013, dating  back to 04/08/2009. Left breast ultrasound 10/03/2013, 09/30/2013.  FINDINGS: Breast composition: b.  Scattered fibroglandular tissue.  Background parenchymal enhancement: Mild.  Right breast: No suspicious mass or abnormal enhancement.  Left breast: Interval reduction in the overall volume of the segmental enhancing tumor in the lower outer left breast. (For the purposes of the measurements that I will give, I measured the segmental enhancement on the DynaCAD workstation at similar levels on the previous and the current MRI, so my measurements for the prior examination will be different than originally stated in that report).  The overall AP length of the segmental enhancement is unchanged at approximately 13 cm. However, there has been marked reduction in tumor volume, with the maximum width currently approximating 3.5 cm (previously 4.8 cm by my measurement), and the maximum height currently approximating 4.3 cm (previously 5.9 cm by my measurement). In addition, the enhancement kinetics of the segmental tumor have improved.  No new or suspicious findings elsewhere in the left breast.  Lymph nodes: Interval marked reduction in size of the previously identified left axillary lymph nodes, the largest node which previously measured approximately 2.8 cm currently measuring approximately 1.7 cm, with more normal-appearing morphology. No new or enlarging lymphadenopathy.  Ancillary findings:  None.  IMPRESSION: 1. Interval marked reduction in volume of the segmental enhancing tumor in the lower outer left breast, consistent with a good response to chemotherapy. Measurements are given above. 2. Interval marked reduction in size of the previously identified left axillary lymph nodes. 3. No new or suspicious findings elsewhere in the left breast. 4. No findings suspicious for malignancy in the right breast.  RECOMMENDATION: Treatment plan.  BI-RADS CATEGORY  6: Known biopsy-proven malignancy.  Electronically Signed   By:  Evangeline Dakin M.D.   On: 12/16/2013 15:50   US Thyroid Biopsy  11/25/2013   CLINICAL DATA:  Breast cancer.  Left thyroid nodule positive on PET.  EXAM: ULTRASOUND GUIDED NEEDLE ASPIRATE BIOPSY OF THE THYROID GLAND  COMPARISON:  None.  PROCEDURE: Thyroid biopsy was thoroughly discussed with the patient and questions were answered. The benefits, risks, alternatives, and complications were also discussed. The patient understands and wishes to proceed with the procedure. Written consent was obtained.  Ultrasound was performed to localize and mark an adequate site for the biopsy. The patient was then prepped and draped in a normal sterile fashion. Local anesthesia was provided with 1% lidocaine. Using direct ultrasound guidance, 4 passes were made using needles into the nodule within the left lobe of the thyroid. Ultrasound was used to confirm needle placements on all occasions. Specimens were sent to Pathology for analysis.  Complications:  None.  FINDINGS: Images document needle placement in the nodule in the left thyroid gland.  IMPRESSION: Ultrasound guided needle aspirate biopsy performed of the left thyroid nodule.   Electronically Signed   By: Maryclare Bean M.D.   On: 11/25/2013 10:06   Mr Attempted Daymon Larsen Report  12/15/2013   This examination belongs to an outside facility and is stored  here for comparison purposes only.  Contact the originating outside  institution for any associated report or interpretation.   ASSESSMENT: 43 y.o. BRCA negative [place] woman   (1)  status post left breast and left axillary lymph node biopsy 10/03/2013, both positive for a clinical T2 N2, stage IIIA invasive ductal carcinoma, grade 2, estrogen and progesterone receptor positive, HER-2 negative, with an MIB-1 of 20%.  (2) Treated with neoadjuvant chemotherapy to consist of doxorubicin and cyclophosphamide in dose dense fashion x4, completed 12/11/2013, to be followed by paclitaxel weekly x12  (2) definitive  surgery to follow chemotherapy  (3) radiation to follow surgery  (4) tamoxifen to be started at the end of radiation  (5) continuing tobacco abuse: The patient has been strongly advised to discontinue smoking  (6) genetic testing sent 10/16/2013 did not reveal any clearly pathogenic (harmful) mutation in any of these genes. The genes tested were ATM, BARD1, BRCA1, BRCA2, BRIP1, CDH1, CHEK2, MRE11A, MUTYH, NBN, NF1, PALB2, PTEN, RAD50, RAD51C, RAD51D, and TP53.  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 Jana Hakim. For the skin rash we'll place her on 10 day course of doxycycline 100 mg by mouth daily. The prescription for this medication was sent her pharmacy of record via E. scribed. She will proceed with her course of weekly Taxol today as scheduled. She'll followup with Dr. Jana Hakim in one week for a symptom management visit.    [place]eah has a good understanding of the overall plan. She agrees with it. She knows a goal of treatment in her case is cure. She will call with any problems that may develop before the next visit here.  Carlton Adam, PA-C   12/25/2013 5:15 PM

## 2013-12-25 NOTE — Telephone Encounter (Signed)
gv adn printed appt sched and avs fo rpt for July adn Aug °

## 2013-12-26 ENCOUNTER — Telehealth: Payer: Self-pay | Admitting: *Deleted

## 2013-12-26 DIAGNOSIS — D497 Neoplasm of unspecified behavior of endocrine glands and other parts of nervous system: Secondary | ICD-10-CM | POA: Insufficient documentation

## 2013-12-26 NOTE — Assessment & Plan Note (Addendum)
Pt to finish chemo around 03/12/2014.  I will see her after her follow up MRI.  She will likely need MRM, but if her nodes become clinically negative, she may be a candidate for the Alliance trial.    We will set up surgery in mid October to early November.

## 2013-12-26 NOTE — Progress Notes (Addendum)
HISTORY:  PT is a 43 yo F who presented in May with a cT3N1 invasive left breast cancer.  She has been on neoadjuvant chemotherapy.  She is doing well overall.  She feels that the tumor has become much softer.  She has been tolerating the treatment.  She underwent MRI which did show improvement thus far.  She denies breast pain.     PERTINENT REVIEW OF SYSTEMS: Enlarged lymph nodes.    Filed Vitals:   12/23/13 1505  BP: 122/80  Pulse: 88  Temp: 97.5 F (36.4 C)   Wt Readings from Last 3 Encounters:  12/25/13 183 lb 8 oz (83.235 kg)  12/23/13 184 lb (83.462 kg)  12/18/13 176 lb (79.833 kg)    EXAM: Head: Normocephalic and atraumatic.  Eyes:  Conjunctivae are normal. Pupils are equal, round, and reactive to light. No scleral icterus.  Neck:  Normal range of motion. Neck supple. No tracheal deviation present. Mild left thyromegaly present.  Resp: No respiratory distress, normal effort. Abd:  Abdomen is soft, non distended and non tender. No masses are palpable.  There is no rebound and no guarding.  Neurological: Alert and oriented to person, place, and time. Coordination normal.  Skin: Skin is warm and dry. No rash noted. No diaphoretic. No erythema. No pallor.  Psychiatric: Normal mood and affect. Normal behavior. Judgment and thought content normal.   MRI 7/14 IMPRESSION:  1. Interval marked reduction in volume of the segmental enhancing  tumor in the lower outer left breast, consistent with a good  response to chemotherapy. Measurements are given above.  2. Interval marked reduction in size of the previously identified  left axillary lymph nodes.  3. No new or suspicious findings elsewhere in the left breast.  4. No findings suspicious for malignancy in the right breast.     ASSESSMENT AND PLAN:   Breast cancer of upper-outer quadrant of left female breast Pt to finish chemo around 03/12/2014.  I will see her after her follow up MRI.  She will likely need MRM, but if  her nodes become clinically negative, she may be a candidate for the Alliance trial.    We will set up surgery in mid October to early November.      Milus Height, MD Surgical Oncology, [facility]  No PCP Per Patient Theotis Burrow, PA-C

## 2013-12-26 NOTE — Assessment & Plan Note (Signed)
Pt will also need left thyroid lobectomy.  She does not really have nodules in the right thyroid and so I would not do completion thyroidectomy unless the neoplasm turns out to be follicular carcinoma.    This will be scheduled at the same time.

## 2013-12-26 NOTE — Telephone Encounter (Signed)
Called pt at home for post chemo follow up.  Spoke with mother.  Per mother, pt was doing fine.  Denied nausea/vomiting, good appetite and drinking lots of fluids as tolerated.  Bowel and bladder function fine.  Denied pain.  Per mother, pt aware of next return appt.  Pt understood to call office with any new issues.

## 2013-12-30 NOTE — Patient Instructions (Signed)
Follow up in 1 week

## 2014-01-01 ENCOUNTER — Ambulatory Visit (HOSPITAL_BASED_OUTPATIENT_CLINIC_OR_DEPARTMENT_OTHER): Payer: Medicaid Other

## 2014-01-01 ENCOUNTER — Other Ambulatory Visit: Payer: Self-pay | Admitting: *Deleted

## 2014-01-01 ENCOUNTER — Other Ambulatory Visit: Payer: No Typology Code available for payment source

## 2014-01-01 ENCOUNTER — Ambulatory Visit (HOSPITAL_BASED_OUTPATIENT_CLINIC_OR_DEPARTMENT_OTHER): Payer: Medicaid Other | Admitting: Oncology

## 2014-01-01 ENCOUNTER — Ambulatory Visit: Payer: No Typology Code available for payment source

## 2014-01-01 ENCOUNTER — Other Ambulatory Visit (HOSPITAL_BASED_OUTPATIENT_CLINIC_OR_DEPARTMENT_OTHER): Payer: Medicaid Other

## 2014-01-01 VITALS — BP 124/79 | HR 91 | Temp 98.1°F | Resp 18 | Ht 64.0 in | Wt 177.3 lb

## 2014-01-01 DIAGNOSIS — C50412 Malignant neoplasm of upper-outer quadrant of left female breast: Secondary | ICD-10-CM

## 2014-01-01 DIAGNOSIS — C773 Secondary and unspecified malignant neoplasm of axilla and upper limb lymph nodes: Secondary | ICD-10-CM

## 2014-01-01 DIAGNOSIS — Z5111 Encounter for antineoplastic chemotherapy: Secondary | ICD-10-CM

## 2014-01-01 DIAGNOSIS — C50419 Malignant neoplasm of upper-outer quadrant of unspecified female breast: Secondary | ICD-10-CM

## 2014-01-01 DIAGNOSIS — Z170 Estrogen receptor positive status [ER+]: Secondary | ICD-10-CM

## 2014-01-01 LAB — COMPREHENSIVE METABOLIC PANEL (CC13)
ALK PHOS: 60 U/L (ref 40–150)
ALT: 13 U/L (ref 0–55)
AST: 16 U/L (ref 5–34)
Albumin: 3.7 g/dL (ref 3.5–5.0)
Anion Gap: 9 mEq/L (ref 3–11)
BUN: 12.6 mg/dL (ref 7.0–26.0)
CO2: 25 mEq/L (ref 22–29)
Calcium: 9.5 mg/dL (ref 8.4–10.4)
Chloride: 105 mEq/L (ref 98–109)
Creatinine: 1 mg/dL (ref 0.6–1.1)
GLUCOSE: 184 mg/dL — AB (ref 70–140)
Potassium: 3.9 mEq/L (ref 3.5–5.1)
SODIUM: 140 meq/L (ref 136–145)
TOTAL PROTEIN: 7.6 g/dL (ref 6.4–8.3)

## 2014-01-01 LAB — CBC WITH DIFFERENTIAL/PLATELET
BASO%: 1.1 % (ref 0.0–2.0)
Basophils Absolute: 0.1 10*3/uL (ref 0.0–0.1)
EOS ABS: 0 10*3/uL (ref 0.0–0.5)
EOS%: 0.9 % (ref 0.0–7.0)
HCT: 26.7 % — ABNORMAL LOW (ref 34.8–46.6)
HGB: 8.7 g/dL — ABNORMAL LOW (ref 11.6–15.9)
LYMPH%: 15.7 % (ref 14.0–49.7)
MCH: 29.5 pg (ref 25.1–34.0)
MCHC: 32.6 g/dL (ref 31.5–36.0)
MCV: 90.6 fL (ref 79.5–101.0)
MONO#: 0.5 10*3/uL (ref 0.1–0.9)
MONO%: 9.1 % (ref 0.0–14.0)
NEUT#: 3.9 10*3/uL (ref 1.5–6.5)
NEUT%: 73.2 % (ref 38.4–76.8)
Platelets: 429 10*3/uL — ABNORMAL HIGH (ref 145–400)
RBC: 2.95 10*6/uL — AB (ref 3.70–5.45)
RDW: 24.5 % — ABNORMAL HIGH (ref 11.2–14.5)
WBC: 5.3 10*3/uL (ref 3.9–10.3)
lymph#: 0.8 10*3/uL — ABNORMAL LOW (ref 0.9–3.3)

## 2014-01-01 MED ORDER — PACLITAXEL CHEMO INJECTION 300 MG/50ML
80.0000 mg/m2 | Freq: Once | INTRAVENOUS | Status: AC
  Administered 2014-01-01: 150 mg via INTRAVENOUS
  Filled 2014-01-01: qty 25

## 2014-01-01 MED ORDER — DIPHENHYDRAMINE HCL 50 MG/ML IJ SOLN
25.0000 mg | Freq: Once | INTRAMUSCULAR | Status: AC
  Administered 2014-01-01: 25 mg via INTRAVENOUS

## 2014-01-01 MED ORDER — ONDANSETRON 8 MG/50ML IVPB (CHCC)
8.0000 mg | Freq: Once | INTRAVENOUS | Status: AC
  Administered 2014-01-01: 8 mg via INTRAVENOUS

## 2014-01-01 MED ORDER — DEXAMETHASONE SODIUM PHOSPHATE 10 MG/ML IJ SOLN
INTRAMUSCULAR | Status: AC
  Filled 2014-01-01: qty 1

## 2014-01-01 MED ORDER — FAMOTIDINE IN NACL 20-0.9 MG/50ML-% IV SOLN
INTRAVENOUS | Status: AC
  Filled 2014-01-01: qty 50

## 2014-01-01 MED ORDER — SODIUM CHLORIDE 0.9 % IV SOLN
Freq: Once | INTRAVENOUS | Status: AC
  Administered 2014-01-01: 14:00:00 via INTRAVENOUS

## 2014-01-01 MED ORDER — HEPARIN SOD (PORK) LOCK FLUSH 100 UNIT/ML IV SOLN
500.0000 [IU] | Freq: Once | INTRAVENOUS | Status: AC | PRN
Start: 2014-01-01 — End: 2014-01-01
  Administered 2014-01-01: 500 [IU]
  Filled 2014-01-01: qty 5

## 2014-01-01 MED ORDER — FAMOTIDINE IN NACL 20-0.9 MG/50ML-% IV SOLN
20.0000 mg | Freq: Once | INTRAVENOUS | Status: AC
  Administered 2014-01-01: 20 mg via INTRAVENOUS

## 2014-01-01 MED ORDER — DEXAMETHASONE SODIUM PHOSPHATE 20 MG/5ML IJ SOLN
INTRAMUSCULAR | Status: AC
  Filled 2014-01-01: qty 5

## 2014-01-01 MED ORDER — ONDANSETRON 8 MG/NS 50 ML IVPB
INTRAVENOUS | Status: AC
  Filled 2014-01-01: qty 8

## 2014-01-01 MED ORDER — DIPHENHYDRAMINE HCL 50 MG/ML IJ SOLN
INTRAMUSCULAR | Status: AC
  Filled 2014-01-01: qty 1

## 2014-01-01 MED ORDER — SODIUM CHLORIDE 0.9 % IJ SOLN
10.0000 mL | INTRAMUSCULAR | Status: DC | PRN
  Administered 2014-01-01: 10 mL
  Filled 2014-01-01: qty 10

## 2014-01-01 MED ORDER — DEXAMETHASONE SODIUM PHOSPHATE 10 MG/ML IJ SOLN
10.0000 mg | Freq: Once | INTRAMUSCULAR | Status: AC
  Administered 2014-01-01: 10 mg via INTRAVENOUS

## 2014-01-01 NOTE — Progress Notes (Signed)
Shelly Hall  Telephone:([phone]) [phone] Fax:([phone]) [phone]     ID: Shelly Hall OB: 01/09/1971  MR#: [id number]  XLK#:[id number]  PCP: No PCP Per Patient GYN:  Shelly Hall SU: Shelly Hall OTHER MD: Shelly Hall  CHIEF COMPLAINT:  Left Breast Cancer CURRENT TREATMENT: Neoadjuvant Chemotherapy  BREAST CANCER HISTORY: From the original intake note:  Shelly Hall palpated a mass in her left breast mid-April 2015 and immediately brought it to Shelly Hall' attention. She was set up for bilateral screening mammography with tomography at [facility] 09/19/2013. The possible distortion in the left breast and a nearby group of calcifications was felt to warrant further evaluation, and on 09/30/2013 she underwent unilateral left diagnostic mammography and ultrasonography. There was an irregular mass in the outer left breast associated with microcalcifications. 4 cm anterior to this there was another cluster of pleomorphic calcifications spanning 7 mm. He had more anteriorly there was an irregular mass associated with other calcifications. In total the abnormalities in the left breast spent approximately 10 cm, and is segmental distribution. On exam there was a palpable firm mass at the 2:30 o'clock position of the left breast 10 cm from the nipple. There was palpable adenopathy in the inferior left axilla. Ultrasound confirmed an irregular hypoechoic mass in the left breast measuring 4.6 cm. In addition, a 1.4 cm oval slightly irregular mass was noted at the 3:00 position and yet another mass measured 9 mm in the same area. Ultrasound of the left axilla showed a dominant 3.6 cm lymph node.  Biopsy of 2 of the left breast masses and the suspicious left breast lymph node on 10/03/2013 showed ([id number]) an invasive ductal carcinoma, grade 2, estrogen receptor 90% positive with strong staining intensity, progesterone receptor 40% positive but moderate staining intensity, with an MIB-1 of  20% and no HER-2 amplification. (Because all 3 biopsies were morphologically identical, only one prognostic panel was sent).  On 10/10/2013 the patient underwent bilateral breast MRI. This showed numerous enhancing masses in the left breast, the largest measuring 3.5 cm, and the aggregate measuring 12.7 cm. There were numerous enlarged left axillary lymph nodes, at least 5 of which were enlarged, both at levels 1 and 2. The largest measured 2.3 cm. The right breast was negative.  The patient's subsequent history is as detailed below.  INTERVAL HISTORY: Shelly Hall returns today for followup of her breast cancer accompanied by her mother. Today is day 1 cycle 2 of 12 planned cycles of weekly paclitaxel.  REVIEW OF SYSTEMS: Shelly Hall with her first Taxol dose last week. She had rare nausea, no vomiting, and overall they're "all right". She had some pain in her legs, which lasted about a day, and for which she took Tylenol. She takes oxycodone at bedtime. She has a little bit of a running nose, rare headaches, which are "better", and hot flashes, which are "not bad". She has no symptoms suggestive of peripheral neuropathy. Overall she is tolerating treatment Hall. A detailed review of systems was noncontributory  PAST MEDICAL HISTORY: Past Medical History  Diagnosis Date  . Diabetes mellitus   . Hypertension   . Sarcoidosis   . Anxiety   . Depression   . Anemia   . Complication of anesthesia     makes pt. restless and unable to be still  . Cancer     breast cancer    PAST SURGICAL HISTORY: Past Surgical History  Procedure Laterality Date  . Tubal ligation  2001  . Endobronchial  ultrasound Bilateral 12/02/2012    Procedure: ENDOBRONCHIAL ULTRASOUND;  Surgeon: Shelly Gobble, MD;  Location: [facility];  Service: Cardiopulmonary;  Laterality: Bilateral;  . Breast surgery Left     breast biopsy times 3  . Portacath placement N/A 10/22/2013    Procedure: INSERTION PORT-A-CATH;   Surgeon: Shelly Klein, MD;  Location: [facility] OR;  Service: General;  Laterality: N/A;    FAMILY HISTORY Family History  Problem Relation Age of Onset  . Cancer Paternal Aunt     "bone cancer"; deceased 40s  . Cancer Paternal Uncle     stomach cancer; deceased 44s  . Cancer Paternal Aunt     unk. primary; currently 9s  . Prostate cancer Maternal Grandfather 42  The patient's parents are living, in fair mid to late 23s. The patient had one brother, no sisters. There is no history of breast or ovarian cancer in the family to her knowledge.   GYNECOLOGIC HISTORY:   (Reviewed 11/27/2013)  menarche age 43, first live birth at 36. She is GX P2. She was still having regular periods at the time of the start of her chemotherapy in May 2015.  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 She is single and lives alone, although her mother is currently staying with her. Her son Shelly Hall lives in [place] and works at [employer]. Son Shelly Hall lives in [place] where he works at a [employer]. The patient has 1 grandchild on the way. She is a Psychologist, forensic.    ADVANCED DIRECTIVES: not in place   HEALTH MAINTENANCE: (Updated 11/27/2013) History  Substance Use Topics  . Smoking status: Former Smoker -- 1.00 packs/day for 23 years    Types: Cigarettes    Quit date: 10/19/2013  . Smokeless tobacco: Never Used  . Alcohol Use: 1.0 oz/week    2 drink(s) per week     Comment: occ. x2 dks per week     Colonoscopy: Never  PAP: Not on file  Bone density: Not on file  Lipid panel:  Not on file  Allergies  Allergen Reactions  . Nyquil Multi-Symptom [Pseudoeph-Doxylamine-Dm-Apap] Itching and Other (See Comments)    Skin peels on hands, and feet    Current Outpatient Prescriptions  Medication Sig Dispense Refill  . acyclovir (ZOVIRAX) 400 MG tablet Take 1 tablet (400 mg total) by mouth 2 (two) times daily. Beginning 11/12/2013  60 tablet   4  . albuterol (PROVENTIL HFA;VENTOLIN HFA) 108 (90 BASE) MCG/ACT inhaler Inhale 2 puffs into the lungs every 6 (six) hours as needed for wheezing or shortness of breath.      Marland Kitchen atenolol-chlorthalidone (TENORETIC) 50-25 MG per tablet Take 1 tablet by mouth daily.  30 tablet  1  . dexamethasone (DECADRON) 4 MG tablet Take 2 tablets (8 mg total) by mouth 2 (two) times daily with a meal. Start the day after chemotherapy for 2 days. Take with food.  30 tablet  1  . docusate sodium (COLACE) 100 MG capsule Take 100 mg by mouth daily.      Marland Kitchen doxycycline (VIBRA-TABS) 100 MG tablet Take 1 tablet (100 mg total) by mouth daily.  10 tablet  0  . ferrous sulfate 325 (65 FE) MG tablet Take 325 mg by mouth 2 (two) times daily with a meal.      . HYDROcodone-ibuprofen (VICOPROFEN) 7.5-200 MG per tablet       . lidocaine-prilocaine (EMLA) cream Apply 1 application topically as needed. Apply over  port area 1-2 hours before chemo and cover with plastic wrap  30 g  0  . Loratadine 10 MG CAPS Take 1 capsule (10 mg total) by mouth as directed.  30 each  3  . LORazepam (ATIVAN) 0.5 MG tablet Take 1 tablet (0.5 mg total) by mouth at bedtime as needed (Nausea or vomiting).  30 tablet  0  . metFORMIN (GLUCOPHAGE) 500 MG tablet Take 1 tablet (500 mg total) by mouth 2 (two) times daily with a meal.  60 tablet  1  . Multiple Vitamin (MULTIVITAMIN WITH MINERALS) TABS tablet Take 1 tablet by mouth daily.      . ondansetron (ZOFRAN) 8 MG tablet Take 1 tablet (8 mg total) by mouth 2 (two) times daily. Start the day after chemo for 2 days. Then take as needed for nausea or vomiting.  30 tablet  1  . oxyCODONE (OXY IR/ROXICODONE) 5 MG immediate release tablet Take 1 tablet (5 mg total) by mouth every 4 (four) hours as needed for severe pain.  30 tablet  0  . pantoprazole (PROTONIX) 40 MG tablet Take 1 tablet (40 mg total) by mouth daily.  30 tablet  5  . potassium chloride SA (K-DUR,KLOR-CON) 20 MEQ tablet Take 1 tablet (20 mEq  total) by mouth daily.  30 tablet  6  . prochlorperazine (COMPAZINE) 10 MG tablet Take 1 tablet (10 mg total) by mouth every 6 (six) hours as needed (Nausea or vomiting).  30 tablet  1   No current facility-administered medications for this visit.   Facility-Administered Medications Ordered in Other Visits  Medication Dose Route Frequency Provider Last Rate Last Dose  . sodium chloride 0.9 % injection 10 mL  10 mL Intracatheter PRN Chauncey Cruel, MD   10 mL at 01/01/14 1558    OBJECTIVE: Middle-aged [place] American woman who appears stated age 43 Vitals:   01/01/14 1331  BP: 124/79  Pulse: 91  Temp: 98.1 F (36.7 C)  Resp: 18     Body mass index is 30.42 kg/(m^2).    ECOG FS:1 - Symptomatic but completely ambulatory Filed Weights   01/01/14 1331  Weight: 177 lb 4.8 oz (80.423 kg)   Sclerae unicteric, EOMs intact Oropharynx clear and moist No cervical or supraclavicular adenopathy Lungs no rales or rhonchi Heart regular rate and rhythm Abd soft, nontender, positive bowel sounds MSK no focal spinal tenderness, no upper extremity lymphedema Neuro: nonfocal, Hall oriented, positive affect Breasts: Deferred  LAB RESULTS:   Lab Results  Component Value Date   WBC 5.3 01/01/2014   NEUTROABS 3.9 01/01/2014   HGB 8.7* 01/01/2014   HCT 26.7* 01/01/2014   MCV 90.6 01/01/2014   PLT 429* 01/01/2014      Chemistry      Component Value Date/Time   NA 140 01/01/2014 1247   NA 140 10/21/2013 1158   K 3.9 01/01/2014 1247   K 3.9 10/21/2013 1158   CL 105 10/21/2013 1158   CO2 25 01/01/2014 1247   CO2 21 10/21/2013 1158   BUN 12.6 01/01/2014 1247   BUN 16 10/21/2013 1158   CREATININE 1.0 01/01/2014 1247   CREATININE 0.95 10/21/2013 1158      Component Value Date/Time   CALCIUM 9.5 01/01/2014 1247   CALCIUM 9.9 10/21/2013 1158   ALKPHOS 60 01/01/2014 1247   ALKPHOS 81 11/27/2012 0900   AST 16 01/01/2014 1247   AST 22 11/27/2012 0900   ALT 13 01/01/2014 1247   ALT 17 11/27/2012 0900  BILITOT <0.20 01/01/2014 1247   BILITOT 0.1* 11/27/2012 0900        STUDIES: Mr Breast Bilateral W Wo Contrast  12/16/2013   CLINICAL DATA:  Patient presented with a palpable mass in the left breast in April, 2015, multiple masses in the outer left breast by ultrasound. 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 A mass at the 3 o'clock position 3 cm from the nipple measured, biopsy proven invasive ductal carcinoma and DCIS. A 1.4 cm mass at the 3 o'clock position 7 cm from the nipple was not biopsied. Biopsy of a palpable pathologic left axillary lymph node revealed metastatic invasive ductal carcinoma. Pre-treatment MRI demonstrated multiple enhancing masses in the outer left breast in a segmental distribution with a total measurement of 12.7 x 4.5 x 3.8 cm, as Hall as multiple pathologic left axillary lymph nodes.  Patient has undergone neoadjuvant chemotherapy since diagnosis. MRI requested to evaluate treatment response.  LABS:  Not applicable.  EXAM: BILATERAL BREAST MRI WITH AND WITHOUT CONTRAST  TECHNIQUE: Multiplanar, multisequence MR images of both breasts were obtained prior to and following the intravenous administration of 16 ml of Multihance.  THREE-DIMENSIONAL MR IMAGE RENDERING ON INDEPENDENT WORKSTATION:  Three-dimensional MR images were rendered by post-processing of the original MR data on an independent workstation. The three-dimensional MR images were interpreted, and findings are reported in the following complete MRI report for this study. Three dimensional images were evaluated at the independent DynaCad workstation.  COMPARISON:  Bilateral breast MRI 10/10/2013. Mammography 10/03/2013, 09/30/2013, dating back to 04/08/2009. Left breast ultrasound 10/03/2013, 09/30/2013.  FINDINGS: Breast composition: b.  Scattered fibroglandular tissue.  Background parenchymal enhancement: Mild.  Right breast:  No suspicious mass or abnormal enhancement.  Left breast: Interval reduction in the overall volume of the segmental enhancing tumor in the lower outer left breast. (For the purposes of the measurements that I will give, I measured the segmental enhancement on the DynaCAD workstation at similar levels on the previous and the current MRI, so my measurements for the prior examination will be different than originally stated in that report).  The overall AP length of the segmental enhancement is unchanged at approximately 13 cm. However, there has been marked reduction in tumor volume, with the maximum width currently approximating 3.5 cm (previously 4.8 cm by my measurement), and the maximum height currently approximating 4.3 cm (previously 5.9 cm by my measurement). In addition, the enhancement kinetics of the segmental tumor have improved.  No new or suspicious findings elsewhere in the left breast.  Lymph nodes: Interval marked reduction in size of the previously identified left axillary lymph nodes, the largest node which previously measured approximately 2.8 cm currently measuring approximately 1.7 cm, with more normal-appearing morphology. No new or enlarging lymphadenopathy.  Ancillary findings:  None.  IMPRESSION: 1. Interval marked reduction in volume of the segmental enhancing tumor in the lower outer left breast, consistent with a good response to chemotherapy. Measurements are given above. 2. Interval marked reduction in size of the previously identified left axillary lymph nodes. 3. No new or suspicious findings elsewhere in the left breast. 4. No findings suspicious for malignancy in the right breast.  RECOMMENDATION: Treatment plan.  BI-RADS CATEGORY  6: Known biopsy-proven malignancy.   Electronically Signed   By: Evangeline Dakin M.D.   On: 12/16/2013 15:50   Mr Attempted Daymon Larsen Report  12/15/2013   This examination belongs to an  outside facility and is stored  here for comparison purposes only.   Contact the originating outside  institution for any associated report or interpretation.  ASSESSMENT: 43 y.o. BRCA negative [place] woman   (1)  status post left breast and left axillary lymph node biopsy 10/03/2013, both positive for a clinical T2 N2, stage IIIA invasive ductal carcinoma, grade 2, estrogen and progesterone receptor positive, HER-2 negative, with an MIB-1 of 20%.  (2)  neoadjuvant chemotherapy consisting of doxorubicin and cyclophosphamide in dose dense fashion x4, completed 12/11/2013, being followed by paclitaxel weekly x12  (2) definitive surgery to follow chemotherapy  (3) radiation to follow surgery  (4) tamoxifen to be started at the end of radiation  (5) continuing tobacco abuse: The patient has been strongly advised to discontinue smoking  (6) genetic testing sent 10/16/2013 did not reveal any clearly pathogenic (harmful) mutation in any of these genes. The genes tested were ATM, BARD1, BRCA1, BRCA2, BRIP1, CDH1, CHEK2, MRE11A, MUTYH, NBN, NF1, PALB2, PTEN, RAD50, RAD51C, RAD51D, and TP53.  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 I am not making any changes in her treatment, other than dropping the Decadron dose with the premeds and a Decadron orally altogether. She will use ondansetron alone for nausea control, and prochlorperazine for backup in case of nausea. The plan continues to try to get the patient through 12 doses of weekly paclitaxel with close monitoring for developing neuropathy, which so far is not apparent.  Demarie has a good understanding of the overall plan. She agrees with it. She knows the goal of treatment in her case is cure. She will call with any problems that may develop before the next visit here.  Chauncey Cruel, MD   01/01/2014 7:02 PM

## 2014-01-01 NOTE — Assessment & Plan Note (Signed)
43 y.o. BRCA negative [place] woman   (1)  status post left breast and left axillary lymph node biopsy 10/03/2013, both positive for a clinical T2 N2, stage IIIA invasive ductal carcinoma, grade 2, estrogen and progesterone receptor positive, HER-2 negative, with an MIB-1 of 20%.  (2)  neoadjuvant chemotherapy consisting of doxorubicin and cyclophosphamide in dose dense fashion x4, completed 12/11/2013, being followed by paclitaxel weekly x12  (2) definitive surgery to follow chemotherapy  (3) radiation to follow surgery  (4) tamoxifen to be started at the end of radiation  (5) continuing tobacco abuse: The patient has been strongly advised to discontinue smoking  (6) genetic testing sent 10/16/2013 did not reveal any clearly pathogenic (harmful) mutation in any of these genes. The genes tested were ATM, BARD1, BRCA1, BRCA2, BRIP1, CDH1, CHEK2, MRE11A, MUTYH, NBN, NF1, PALB2, PTEN, RAD50, RAD51C, RAD51D, and TP53.  (7) biopsy of the left thyroid nodule 50/93/2671 showed a follicular neoplasm

## 2014-01-01 NOTE — Patient Instructions (Signed)
Cancer Center Discharge Instructions for Patients Receiving Chemotherapy  Today you received the following chemotherapy agents Taxol.  To help prevent nausea and vomiting after your treatment, we encourage you to take your nausea medication as directed.    If you develop nausea and vomiting that is not controlled by your nausea medication, call the clinic.   BELOW ARE SYMPTOMS THAT SHOULD BE REPORTED IMMEDIATELY:  *FEVER GREATER THAN 100.5 F  *CHILLS WITH OR WITHOUT FEVER  NAUSEA AND VOMITING THAT IS NOT CONTROLLED WITH YOUR NAUSEA MEDICATION  *UNUSUAL SHORTNESS OF BREATH  *UNUSUAL BRUISING OR BLEEDING  TENDERNESS IN MOUTH AND THROAT WITH OR WITHOUT PRESENCE OF ULCERS  *URINARY PROBLEMS  *BOWEL PROBLEMS  UNUSUAL RASH Items with * indicate a potential emergency and should be followed up as soon as possible.  Feel free to call the clinic you have any questions or concerns. The clinic phone number is [phone].    

## 2014-01-08 ENCOUNTER — Ambulatory Visit (HOSPITAL_BASED_OUTPATIENT_CLINIC_OR_DEPARTMENT_OTHER): Payer: No Typology Code available for payment source

## 2014-01-08 ENCOUNTER — Other Ambulatory Visit: Payer: No Typology Code available for payment source

## 2014-01-08 ENCOUNTER — Other Ambulatory Visit (HOSPITAL_BASED_OUTPATIENT_CLINIC_OR_DEPARTMENT_OTHER): Payer: No Typology Code available for payment source

## 2014-01-08 ENCOUNTER — Ambulatory Visit (HOSPITAL_BASED_OUTPATIENT_CLINIC_OR_DEPARTMENT_OTHER): Payer: No Typology Code available for payment source | Admitting: Hematology

## 2014-01-08 ENCOUNTER — Encounter: Payer: Self-pay | Admitting: Hematology

## 2014-01-08 VITALS — BP 114/80 | HR 90 | Temp 98.3°F | Resp 18 | Ht 64.0 in | Wt 176.2 lb

## 2014-01-08 DIAGNOSIS — C73 Malignant neoplasm of thyroid gland: Secondary | ICD-10-CM

## 2014-01-08 DIAGNOSIS — C50412 Malignant neoplasm of upper-outer quadrant of left female breast: Secondary | ICD-10-CM

## 2014-01-08 DIAGNOSIS — C50919 Malignant neoplasm of unspecified site of unspecified female breast: Secondary | ICD-10-CM

## 2014-01-08 DIAGNOSIS — C50419 Malignant neoplasm of upper-outer quadrant of unspecified female breast: Secondary | ICD-10-CM

## 2014-01-08 DIAGNOSIS — C773 Secondary and unspecified malignant neoplasm of axilla and upper limb lymph nodes: Secondary | ICD-10-CM

## 2014-01-08 DIAGNOSIS — Z5111 Encounter for antineoplastic chemotherapy: Secondary | ICD-10-CM

## 2014-01-08 DIAGNOSIS — F17200 Nicotine dependence, unspecified, uncomplicated: Secondary | ICD-10-CM

## 2014-01-08 LAB — COMPREHENSIVE METABOLIC PANEL (CC13)
ALBUMIN: 3.8 g/dL (ref 3.5–5.0)
ALT: 20 U/L (ref 0–55)
AST: 19 U/L (ref 5–34)
Alkaline Phosphatase: 56 U/L (ref 40–150)
Anion Gap: 10 mEq/L (ref 3–11)
BUN: 16.6 mg/dL (ref 7.0–26.0)
CO2: 25 mEq/L (ref 22–29)
Calcium: 9.7 mg/dL (ref 8.4–10.4)
Chloride: 105 mEq/L (ref 98–109)
Creatinine: 1 mg/dL (ref 0.6–1.1)
Glucose: 155 mg/dl — ABNORMAL HIGH (ref 70–140)
POTASSIUM: 4 meq/L (ref 3.5–5.1)
Sodium: 140 mEq/L (ref 136–145)
Total Bilirubin: 0.28 mg/dL (ref 0.20–1.20)
Total Protein: 7.8 g/dL (ref 6.4–8.3)

## 2014-01-08 LAB — CBC WITH DIFFERENTIAL/PLATELET
BASO%: 1.4 % (ref 0.0–2.0)
BASOS ABS: 0.1 10*3/uL (ref 0.0–0.1)
EOS%: 3.4 % (ref 0.0–7.0)
Eosinophils Absolute: 0.2 10*3/uL (ref 0.0–0.5)
HCT: 30 % — ABNORMAL LOW (ref 34.8–46.6)
HEMOGLOBIN: 9.7 g/dL — AB (ref 11.6–15.9)
LYMPH%: 12.8 % — AB (ref 14.0–49.7)
MCH: 30.2 pg (ref 25.1–34.0)
MCHC: 32.5 g/dL (ref 31.5–36.0)
MCV: 93.1 fL (ref 79.5–101.0)
MONO#: 0.4 10*3/uL (ref 0.1–0.9)
MONO%: 8.7 % (ref 0.0–14.0)
NEUT#: 3.4 10*3/uL (ref 1.5–6.5)
NEUT%: 73.7 % (ref 38.4–76.8)
Platelets: 382 10*3/uL (ref 145–400)
RBC: 3.22 10*6/uL — AB (ref 3.70–5.45)
RDW: 23.2 % — AB (ref 11.2–14.5)
WBC: 4.7 10*3/uL (ref 3.9–10.3)
lymph#: 0.6 10*3/uL — ABNORMAL LOW (ref 0.9–3.3)

## 2014-01-08 MED ORDER — DIPHENHYDRAMINE HCL 50 MG/ML IJ SOLN
25.0000 mg | Freq: Once | INTRAMUSCULAR | Status: AC
  Administered 2014-01-08: 25 mg via INTRAVENOUS

## 2014-01-08 MED ORDER — SODIUM CHLORIDE 0.9 % IV SOLN
Freq: Once | INTRAVENOUS | Status: AC
  Administered 2014-01-08: 11:00:00 via INTRAVENOUS

## 2014-01-08 MED ORDER — PACLITAXEL CHEMO INJECTION 300 MG/50ML
80.0000 mg/m2 | Freq: Once | INTRAVENOUS | Status: AC
  Administered 2014-01-08: 150 mg via INTRAVENOUS
  Filled 2014-01-08: qty 25

## 2014-01-08 MED ORDER — ONDANSETRON 8 MG/NS 50 ML IVPB
INTRAVENOUS | Status: AC
  Filled 2014-01-08: qty 8

## 2014-01-08 MED ORDER — ONDANSETRON 8 MG/50ML IVPB (CHCC)
8.0000 mg | Freq: Once | INTRAVENOUS | Status: AC
  Administered 2014-01-08: 8 mg via INTRAVENOUS

## 2014-01-08 MED ORDER — FAMOTIDINE IN NACL 20-0.9 MG/50ML-% IV SOLN
INTRAVENOUS | Status: AC
  Filled 2014-01-08: qty 50

## 2014-01-08 MED ORDER — SODIUM CHLORIDE 0.9 % IJ SOLN
10.0000 mL | INTRAMUSCULAR | Status: DC | PRN
  Administered 2014-01-08: 10 mL
  Filled 2014-01-08: qty 10

## 2014-01-08 MED ORDER — HEPARIN SOD (PORK) LOCK FLUSH 100 UNIT/ML IV SOLN
500.0000 [IU] | Freq: Once | INTRAVENOUS | Status: AC | PRN
  Administered 2014-01-08: 500 [IU]
  Filled 2014-01-08: qty 5

## 2014-01-08 MED ORDER — DEXAMETHASONE SODIUM PHOSPHATE 10 MG/ML IJ SOLN
10.0000 mg | Freq: Once | INTRAMUSCULAR | Status: AC
  Administered 2014-01-08: 10 mg via INTRAVENOUS

## 2014-01-08 MED ORDER — DEXAMETHASONE SODIUM PHOSPHATE 10 MG/ML IJ SOLN
INTRAMUSCULAR | Status: AC
  Filled 2014-01-08: qty 1

## 2014-01-08 MED ORDER — FAMOTIDINE IN NACL 20-0.9 MG/50ML-% IV SOLN
20.0000 mg | Freq: Once | INTRAVENOUS | Status: AC
  Administered 2014-01-08: 20 mg via INTRAVENOUS

## 2014-01-08 MED ORDER — DEXAMETHASONE SODIUM PHOSPHATE 20 MG/5ML IJ SOLN
INTRAMUSCULAR | Status: AC
  Filled 2014-01-08: qty 5

## 2014-01-08 MED ORDER — DIPHENHYDRAMINE HCL 50 MG/ML IJ SOLN
INTRAMUSCULAR | Status: AC
  Filled 2014-01-08: qty 1

## 2014-01-08 NOTE — Progress Notes (Signed)
[place]  Telephone:([phone]) [phone] Fax:(336) [phone]     ID: Shelly Hall OB: 03-16-1971  MR#: [id number]  MVH#:[id number]  PCP: No PCP Per Patient GYN:  [name] SU: [name] OTHER MD: Thea Silversmith  CHIEF COMPLAINT:  Left Breast Cancer CURRENT TREATMENT: Neoadjuvant Chemotherapy week 3 or cycle 3 of taxol today.  BREAST CANCER HISTORY: Narmeen palpated a mass in her left breast mid-April 2015 and immediately brought it to Dr. Benjie Karvonen' attention. She was set up for bilateral screening mammography with tomography at [facility] 09/19/2013. The possible distortion in the left breast and a nearby group of calcifications was felt to warrant further evaluation, and on 09/30/2013 she underwent unilateral left diagnostic mammography and ultrasonography. There was an irregular mass in the outer left breast associated with microcalcifications. 4 cm anterior to this there was another cluster of pleomorphic calcifications spanning 7 mm. He had more anteriorly there was an irregular mass associated with other calcifications. In total the abnormalities in the left breast spent approximately 10 cm, and is segmental distribution. On exam there was a palpable firm mass at the 2:30 o'clock position of the left breast 10 cm from the nipple. There was palpable adenopathy in the inferior left axilla. Ultrasound confirmed an irregular hypoechoic mass in the left breast measuring 4.6 cm. In addition, a 1.4 cm oval slightly irregular mass was noted at the 3:00 position and yet another mass measured 9 mm in the same area. Ultrasound of the left axilla showed a dominant 3.6 cm lymph node.  Biopsy of 2 of the left breast masses and the suspicious left breast lymph node on 10/03/2013 showed ([id number]) an invasive ductal carcinoma, grade 2, estrogen receptor 90% positive with strong staining intensity, progesterone receptor 40% positive but moderate staining intensity, with an MIB-1 of  20% and no HER-2 amplification. (Because all 3 biopsies were morphologically identical, only one prognostic panel was sent).  On 10/10/2013 the patient underwent bilateral breast MRI. This showed numerous enhancing masses in the left breast, the largest measuring 3.5 cm, and the aggregate measuring 12.7 cm. There were numerous enlarged left axillary lymph nodes, at least 5 of which were enlarged, both at levels 1 and 2. The largest measured 2.3 cm. The right breast was negative.  The patient's subsequent history is as detailed below.  INTERVAL HISTORY: Flor is doing well, mild tingling in 2 fingers nut no gross neuropathy. No nausea vomiting chest pain or ankle sweeling.  REVIEW OF SYSTEMS: Lamara tolerated her previous chemotherapy generally well. With the exception of the skin rash she voices no other complaints. She uses Ativan as needed for nausea. She does report some fatigue. Remainder of the review of systems today was noncontributory   PAST MEDICAL HISTORY: Past Medical History  Diagnosis Date  . Diabetes mellitus   . Hypertension   . Sarcoidosis   . Anxiety   . Depression   . Anemia   . Complication of anesthesia     makes pt. restless and unable to be still  . Cancer     breast cancer    PAST SURGICAL HISTORY: Past Surgical History  Procedure Laterality Date  . Tubal ligation  2001  . Endobronchial ultrasound Bilateral 12/02/2012    Procedure: ENDOBRONCHIAL ULTRASOUND;  Surgeon: Collene Gobble, MD;  Location: [facility] END[facility]COPY;  Service: Cardiopulmonary;  Laterality: Bilateral;  . Breast surgery Left     breast biopsy times 3  . Portacath placement N/A 10/22/2013    Procedure:  INSERTION PORT-A-CATH;  Surgeon: [name], MD;  Location: [facility] OR;  Service: General;  Laterality: N/A;    FAMILY HISTORY Family History  Problem Relation Age of Onset  . Cancer Paternal Aunt     "bone cancer"; deceased 75s  . Cancer Paternal Uncle     stomach cancer; deceased 52s  .  Cancer Paternal Aunt     unk. primary; currently 78s  . Prostate cancer Maternal Grandfather 53  The patient's parents are living, in fair mid to late 15s. The patient had one brother, no sisters. There is no history of breast or ovarian cancer in the family to her knowledge.   GYNECOLOGIC HISTORY:   (Reviewed 11/27/2013)  menarche age 46, first live birth at 70. She is GX P2. She was still having regular periods at the time of the start of her chemotherapy in May 2015.  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 She is single and lives alone, although her mother is currently staying with her. Her son Kayleanna Lorman lives in [place] and works at [employer]. Son [name] lives in [place] where he works at a [employer]. The patient has 1 grandchild on the way. 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 Will be taking a break to go and visit her son who lives in [place], [place]: not in place   HEALTH MAINTENANCE: (Updated 11/27/2013) History  Substance Use Topics  . Smoking status: Former Smoker -- 1.00 packs/day for 23 years    Types: Cigarettes    Quit date: 10/19/2013  . Smokeless tobacco: Never Used  . Alcohol Use: 1.0 oz/week    2 drink(s) per week     Comment: occ. x2 dks per week     Colonoscopy: Never  PAP: Not on file  Bone density: Not on file  Lipid panel:  Not on file  Allergies  Allergen Reactions  . Nyquil Multi-Symptom [Pseudoeph-Doxylamine-Dm-Apap] Itching and Other (See Comments)    Skin peels on hands, and feet    Current Outpatient Prescriptions  Medication Sig Dispense Refill  . acyclovir (ZOVIRAX) 400 MG tablet Take 1 tablet (400 mg total) by mouth 2 (two) times daily. Beginning 11/12/2013  60 tablet  4  . albuterol (PROVENTIL HFA;VENTOLIN HFA) 108 (90 BASE) MCG/ACT inhaler Inhale 2 puffs into the lungs every 6 (six) hours as needed for wheezing or  shortness of breath.      Marland Kitchen atenolol-chlorthalidone (TENORETIC) 50-25 MG per tablet Take 1 tablet by mouth daily.  30 tablet  1  . docusate sodium (COLACE) 100 MG capsule Take 100 mg by mouth daily.      Marland Kitchen doxycycline (VIBRA-TABS) 100 MG tablet Take 1 tablet (100 mg total) by mouth daily.  10 tablet  0  . ferrous sulfate 325 (65 FE) MG tablet Take 325 mg by mouth 2 (two) times daily with a meal.      . HYDROcodone-ibuprofen (VICOPROFEN) 7.5-200 MG per tablet       . lidocaine-prilocaine (EMLA) cream Apply 1 application topically as needed. Apply over port area 1-2 hours before chemo and cover with plastic wrap  30 g  0  . Loratadine 10 MG CAPS Take 1 capsule (10 mg total) by mouth as directed.  30 each  3  . LORazepam (ATIVAN) 0.5 MG tablet Take 1 tablet (0.5 mg total) by mouth at bedtime as needed (Nausea or vomiting).  30 tablet  0  .  metFORMIN (GLUCOPHAGE) 500 MG tablet Take 1 tablet (500 mg total) by mouth 2 (two) times daily with a meal.  60 tablet  1  . Multiple Vitamin (MULTIVITAMIN WITH MINERALS) TABS tablet Take 1 tablet by mouth daily.      . ondansetron (ZOFRAN) 8 MG tablet Take 1 tablet (8 mg total) by mouth 2 (two) times daily. Start the day after chemo for 2 days. Then take as needed for nausea or vomiting.  30 tablet  1  . oxyCODONE (OXY IR/ROXICODONE) 5 MG immediate release tablet Take 1 tablet (5 mg total) by mouth every 4 (four) hours as needed for severe pain.  30 tablet  0  . pantoprazole (PROTONIX) 40 MG tablet Take 1 tablet (40 mg total) by mouth daily.  30 tablet  5  . prochlorperazine (COMPAZINE) 10 MG tablet Take 1 tablet (10 mg total) by mouth every 6 (six) hours as needed (Nausea or vomiting).  30 tablet  1  . dexamethasone (DECADRON) 4 MG tablet Take 2 tablets (8 mg total) by mouth 2 (two) times daily with a meal. Start the day after chemotherapy for 2 days. Take with food.  30 tablet  1  . potassium chloride SA (K-DUR,KLOR-CON) 20 MEQ tablet Take 1 tablet (20 mEq total)  by mouth daily.  30 tablet  6   No current facility-administered medications for this visit.    OBJECTIVE: Middle-aged [place] American woman in no acute distress Filed Vitals:   01/08/14 0948  BP: 114/80  Pulse: 90  Temp: 98.3 F (36.8 C)  Resp: 18     Body mass index is 30.23 kg/(m^2).    ECOG FS:1 - Symptomatic but completely ambulatory Filed Weights   01/08/14 0948  Weight: 176 lb 3 oz (79.918 kg)   Sclerae unicteric, pupils equal and reactive Oropharynx clear and moist; no thyromegaly palpated No cervical or supraclavicular adenopathy Lungs no rales or rhonchi Heart regular rate and rhythm Abd soft, nontender, positive bowel sounds MSK no focal spinal tenderness, no upper extremity lymphedema Neuro: nonfocal, well oriented, appropriate affect Breasts: Deferred Skin: fine papular eruptions affecting the nose, above the upper lip, cheeks and chin  LAB RESULTS:   Lab Results  Component Value Date   WBC 4.7 01/08/2014   NEUTROABS 3.4 01/08/2014   HGB 9.7* 01/08/2014   HCT 30.0* 01/08/2014   MCV 93.1 01/08/2014   PLT 382 01/08/2014      Chemistry      Component Value Date/Time   NA 140 01/08/2014 0937   NA 140 10/21/2013 1158   K 4.0 01/08/2014 0937   K 3.9 10/21/2013 1158   CL 105 10/21/2013 1158   CO2 25 01/08/2014 0937   CO2 21 10/21/2013 1158   BUN 16.6 01/08/2014 0937   BUN 16 10/21/2013 1158   CREATININE 1.0 01/08/2014 0937   CREATININE 0.95 10/21/2013 1158      Component Value Date/Time   CALCIUM 9.7 01/08/2014 0937   CALCIUM 9.9 10/21/2013 1158   ALKPHOS 56 01/08/2014 0937   ALKPHOS 81 11/27/2012 0900   AST 19 01/08/2014 0937   AST 22 11/27/2012 0900   ALT 20 01/08/2014 0937   ALT 17 11/27/2012 0900   BILITOT 0.28 01/08/2014 0937   BILITOT 0.1* 11/27/2012 0900        STUDIES:  Echocardiogram on 10/29/2013 showed an ejection fraction of 55-60%. Mr Breast Bilateral W Wo Contrast  12/16/2013   CLINICAL DATA:  Patient presented with a palpable mass in the left  breast in April,  2015, multiple masses in the outer left breast by ultrasound. 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 A mass at the 3 o'clock position 3 cm from the nipple measured, biopsy proven invasive ductal carcinoma and DCIS. A 1.4 cm mass at the 3 o'clock position 7 cm from the nipple was not biopsied. Biopsy of a palpable pathologic left axillary lymph node revealed metastatic invasive ductal carcinoma. Pre-treatment MRI demonstrated multiple enhancing masses in the outer left breast in a segmental distribution with a total measurement of 12.7 x 4.5 x 3.8 cm, as well as multiple pathologic left axillary lymph nodes.  Patient has undergone neoadjuvant chemotherapy since diagnosis. MRI requested to evaluate treatment response.  LABS:  Not applicable.  EXAM: BILATERAL BREAST MRI WITH AND WITHOUT CONTRAST  TECHNIQUE: Multiplanar, multisequence MR images of both breasts were obtained prior to and following the intravenous administration of 16 ml of Multihance.  THREE-DIMENSIONAL MR IMAGE RENDERING ON INDEPENDENT WORKSTATION:  Three-dimensional MR images were rendered by post-processing of the original MR data on an independent workstation. The three-dimensional MR images were interpreted, and findings are reported in the following complete MRI report for this study. Three dimensional images were evaluated at the independent DynaCad workstation.  COMPARISON:  Bilateral breast MRI 10/10/2013. Mammography 10/03/2013, 09/30/2013, dating back to 04/08/2009. Left breast ultrasound 10/03/2013, 09/30/2013.  FINDINGS: Breast composition: b.  Scattered fibroglandular tissue.  Background parenchymal enhancement: Mild.  Right breast: No suspicious mass or abnormal enhancement.  Left breast: Interval reduction in the overall volume of the segmental enhancing tumor in the lower outer left breast. (For the purposes of the measurements that I will  give, I measured the segmental enhancement on the DynaCAD workstation at similar levels on the previous and the current MRI, so my measurements for the prior examination will be different than originally stated in that report).  The overall AP length of the segmental enhancement is unchanged at approximately 13 cm. However, there has been marked reduction in tumor volume, with the maximum width currently approximating 3.5 cm (previously 4.8 cm by my measurement), and the maximum height currently approximating 4.3 cm (previously 5.9 cm by my measurement). In addition, the enhancement kinetics of the segmental tumor have improved.  No new or suspicious findings elsewhere in the left breast.  Lymph nodes: Interval marked reduction in size of the previously identified left axillary lymph nodes, the largest node which previously measured approximately 2.8 cm currently measuring approximately 1.7 cm, with more normal-appearing morphology. No new or enlarging lymphadenopathy.  Ancillary findings:  None.  IMPRESSION: 1. Interval marked reduction in volume of the segmental enhancing tumor in the lower outer left breast, consistent with a good response to chemotherapy. Measurements are given above. 2. Interval marked reduction in size of the previously identified left axillary lymph nodes. 3. No new or suspicious findings elsewhere in the left breast. 4. No findings suspicious for malignancy in the right breast.  RECOMMENDATION: Treatment plan.  BI-RADS CATEGORY  6: Known biopsy-proven malignancy.   Electronically Signed   By: Evangeline Dakin M.D.   On: 12/16/2013 15:50   US Thyroid Biopsy  11/25/2013   CLINICAL DATA:  Breast cancer.  Left thyroid nodule positive on PET.  EXAM: ULTRASOUND GUIDED NEEDLE ASPIRATE BIOPSY OF THE THYROID GLAND  COMPARISON:  None.  PROCEDURE: Thyroid biopsy was thoroughly discussed with the patient and questions were answered. The benefits, risks, alternatives,  and complications were also  discussed. The patient understands and wishes to proceed with the procedure. Written consent was obtained.  Ultrasound was performed to localize and mark an adequate site for the biopsy. The patient was then prepped and draped in a normal sterile fashion. Local anesthesia was provided with 1% lidocaine. Using direct ultrasound guidance, 4 passes were made using needles into the nodule within the left lobe of the thyroid. Ultrasound was used to confirm needle placements on all occasions. Specimens were sent to Pathology for analysis.  Complications:  None.  FINDINGS: Images document needle placement in the nodule in the left thyroid gland.  IMPRESSION: Ultrasound guided needle aspirate biopsy performed of the left thyroid nodule.   Electronically Signed   By: Maryclare Bean M.D.   On: 11/25/2013 10:06   Mr Attempted Daymon Larsen Report  12/15/2013   This examination belongs to an outside facility and is stored  here for comparison purposes only.  Contact the originating outside  institution for any associated report or interpretation.   ASSESSMENT: 43 y.o. BRCA negative [place] woman   (1)  status post left breast and left axillary lymph node biopsy 10/03/2013, both positive for a clinical T2 N2, stage IIIA invasive ductal carcinoma, grade 2, estrogen and progesterone receptor positive, HER-2 negative, with an MIB-1 of 20%.  (2) Treated with neoadjuvant chemotherapy to consist of doxorubicin and cyclophosphamide in dose dense fashion x4, completed 12/11/2013, to be followed by paclitaxel weekly x12, today is cycle 3 of taxol which she is tolerating well. Physical exam and labs are fine.  (2) definitive surgery to follow chemotherapy  (3) radiation to follow surgery  (4) tamoxifen to be started at the end of radiation  (5) continuing tobacco abuse: The patient has been strongly advised to discontinue smoking  (6) genetic testing sent 10/16/2013 did not reveal any clearly pathogenic (harmful) mutation in  any of these genes. The genes tested were ATM, BARD1, BRCA1, BRCA2, BRIP1, CDH1, CHEK2, MRE11A, MUTYH, NBN, NF1, PALB2, PTEN, RAD50, RAD51C, RAD51D, and TP53.  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 Continue with weekly labs and weekly visits.  2. No major toxicity noted from chemo.    Alayshia has a good understanding of the overall plan. She agrees with it. She knows a goal of treatment in her case is cure. She will call with any problems that may develop before the next visit here.    Bernadene Bell, MD Medical Hematologist/Oncologist [place] Pager: [phone] Office No: [phone]  01/08/2014 10:34 AM

## 2014-01-08 NOTE — Patient Instructions (Signed)
[facility] Discharge Instructions for Patients Receiving Chemotherapy  Today you received the following chemotherapy agents Taxol  To help prevent nausea and vomiting after your treatment, we encourage you to take your nausea medications as prescribed.   If you develop nausea and vomiting that is not controlled by your nausea medication, call the clinic.   BELOW ARE SYMPTOMS THAT SHOULD BE REPORTED IMMEDIATELY:  *FEVER GREATER THAN 100.5 F  *CHILLS WITH OR WITHOUT FEVER  NAUSEA AND VOMITING THAT IS NOT CONTROLLED WITH YOUR NAUSEA MEDICATION  *UNUSUAL SHORTNESS OF BREATH  *UNUSUAL BRUISING OR BLEEDING  TENDERNESS IN MOUTH AND THROAT WITH OR WITHOUT PRESENCE OF ULCERS  *URINARY PROBLEMS  *BOWEL PROBLEMS  UNUSUAL RASH Items with * indicate a potential emergency and should be followed up as soon as possible.  Feel free to call the clinic you have any questions or concerns. The clinic phone number is [phone].

## 2014-01-15 ENCOUNTER — Other Ambulatory Visit: Payer: Self-pay | Admitting: Hematology

## 2014-01-15 ENCOUNTER — Ambulatory Visit (HOSPITAL_BASED_OUTPATIENT_CLINIC_OR_DEPARTMENT_OTHER): Payer: No Typology Code available for payment source | Admitting: Nurse Practitioner

## 2014-01-15 ENCOUNTER — Ambulatory Visit (HOSPITAL_BASED_OUTPATIENT_CLINIC_OR_DEPARTMENT_OTHER): Payer: No Typology Code available for payment source

## 2014-01-15 ENCOUNTER — Other Ambulatory Visit: Payer: No Typology Code available for payment source

## 2014-01-15 ENCOUNTER — Other Ambulatory Visit (HOSPITAL_BASED_OUTPATIENT_CLINIC_OR_DEPARTMENT_OTHER): Payer: No Typology Code available for payment source

## 2014-01-15 VITALS — BP 128/72 | HR 79 | Temp 98.1°F | Resp 18 | Ht 64.0 in | Wt 178.3 lb

## 2014-01-15 DIAGNOSIS — C50419 Malignant neoplasm of upper-outer quadrant of unspecified female breast: Secondary | ICD-10-CM

## 2014-01-15 DIAGNOSIS — C50919 Malignant neoplasm of unspecified site of unspecified female breast: Secondary | ICD-10-CM

## 2014-01-15 DIAGNOSIS — C773 Secondary and unspecified malignant neoplasm of axilla and upper limb lymph nodes: Secondary | ICD-10-CM

## 2014-01-15 DIAGNOSIS — Z170 Estrogen receptor positive status [ER+]: Secondary | ICD-10-CM

## 2014-01-15 DIAGNOSIS — Z5111 Encounter for antineoplastic chemotherapy: Secondary | ICD-10-CM

## 2014-01-15 DIAGNOSIS — C50412 Malignant neoplasm of upper-outer quadrant of left female breast: Secondary | ICD-10-CM

## 2014-01-15 DIAGNOSIS — R232 Flushing: Secondary | ICD-10-CM | POA: Insufficient documentation

## 2014-01-15 DIAGNOSIS — F17200 Nicotine dependence, unspecified, uncomplicated: Secondary | ICD-10-CM

## 2014-01-15 DIAGNOSIS — R61 Generalized hyperhidrosis: Secondary | ICD-10-CM

## 2014-01-15 LAB — COMPREHENSIVE METABOLIC PANEL (CC13)
ALT: 24 U/L (ref 0–55)
AST: 22 U/L (ref 5–34)
Albumin: 3.8 g/dL (ref 3.5–5.0)
Alkaline Phosphatase: 56 U/L (ref 40–150)
Anion Gap: 10 mEq/L (ref 3–11)
BUN: 12.5 mg/dL (ref 7.0–26.0)
CALCIUM: 9.8 mg/dL (ref 8.4–10.4)
CO2: 26 mEq/L (ref 22–29)
Chloride: 103 mEq/L (ref 98–109)
Creatinine: 0.9 mg/dL (ref 0.6–1.1)
Glucose: 142 mg/dl — ABNORMAL HIGH (ref 70–140)
Potassium: 3.7 mEq/L (ref 3.5–5.1)
Sodium: 139 mEq/L (ref 136–145)
Total Bilirubin: <0.2 mg/dL (ref 0.20–1.20)
Total Protein: 7.7 g/dL (ref 6.4–8.3)

## 2014-01-15 LAB — CBC WITH DIFFERENTIAL/PLATELET
BASO%: 0.5 % (ref 0.0–2.0)
Basophils Absolute: 0 10*3/uL (ref 0.0–0.1)
EOS%: 4.8 % (ref 0.0–7.0)
Eosinophils Absolute: 0.3 10*3/uL (ref 0.0–0.5)
HCT: 31.7 % — ABNORMAL LOW (ref 34.8–46.6)
HGB: 10.3 g/dL — ABNORMAL LOW (ref 11.6–15.9)
LYMPH#: 0.8 10*3/uL — AB (ref 0.9–3.3)
LYMPH%: 12 % — ABNORMAL LOW (ref 14.0–49.7)
MCH: 30.7 pg (ref 25.1–34.0)
MCHC: 32.6 g/dL (ref 31.5–36.0)
MCV: 94 fL (ref 79.5–101.0)
MONO#: 0.5 10*3/uL (ref 0.1–0.9)
MONO%: 7.7 % (ref 0.0–14.0)
NEUT%: 75 % (ref 38.4–76.8)
NEUTROS ABS: 4.8 10*3/uL (ref 1.5–6.5)
Platelets: 292 10*3/uL (ref 145–400)
RBC: 3.37 10*6/uL — ABNORMAL LOW (ref 3.70–5.45)
RDW: 21.6 % — AB (ref 11.2–14.5)
WBC: 6.5 10*3/uL (ref 3.9–10.3)

## 2014-01-15 MED ORDER — GABAPENTIN 300 MG PO CAPS: 300.0000 mg | ORAL_CAPSULE | Freq: Three times a day (TID) | ORAL | Status: DC

## 2014-01-15 MED ORDER — DEXAMETHASONE SODIUM PHOSPHATE 10 MG/ML IJ SOLN
INTRAMUSCULAR | Status: AC
  Filled 2014-01-15: qty 1

## 2014-01-15 MED ORDER — SODIUM CHLORIDE 0.9 % IV SOLN
Freq: Once | INTRAVENOUS | Status: AC
  Administered 2014-01-15: 13:00:00 via INTRAVENOUS

## 2014-01-15 MED ORDER — ONDANSETRON 8 MG/NS 50 ML IVPB
INTRAVENOUS | Status: AC
  Filled 2014-01-15: qty 8

## 2014-01-15 MED ORDER — SODIUM CHLORIDE 0.9 % IJ SOLN
10.0000 mL | INTRAMUSCULAR | Status: DC | PRN
  Administered 2014-01-15: 10 mL
  Filled 2014-01-15: qty 10

## 2014-01-15 MED ORDER — DEXAMETHASONE SODIUM PHOSPHATE 10 MG/ML IJ SOLN
10.0000 mg | Freq: Once | INTRAMUSCULAR | Status: AC
  Administered 2014-01-15: 10 mg via INTRAVENOUS

## 2014-01-15 MED ORDER — FAMOTIDINE IN NACL 20-0.9 MG/50ML-% IV SOLN
INTRAVENOUS | Status: AC
  Filled 2014-01-15: qty 50

## 2014-01-15 MED ORDER — FAMOTIDINE IN NACL 20-0.9 MG/50ML-% IV SOLN
20.0000 mg | Freq: Once | INTRAVENOUS | Status: AC
  Administered 2014-01-15: 20 mg via INTRAVENOUS

## 2014-01-15 MED ORDER — HEPARIN SOD (PORK) LOCK FLUSH 100 UNIT/ML IV SOLN
500.0000 [IU] | Freq: Once | INTRAVENOUS | Status: AC | PRN
  Administered 2014-01-15: 500 [IU]
  Filled 2014-01-15: qty 5

## 2014-01-15 MED ORDER — DIPHENHYDRAMINE HCL 50 MG/ML IJ SOLN
INTRAMUSCULAR | Status: AC
  Filled 2014-01-15: qty 1

## 2014-01-15 MED ORDER — DIPHENHYDRAMINE HCL 50 MG/ML IJ SOLN
25.0000 mg | Freq: Once | INTRAMUSCULAR | Status: AC
  Administered 2014-01-15: 25 mg via INTRAVENOUS

## 2014-01-15 MED ORDER — PACLITAXEL CHEMO INJECTION 300 MG/50ML
80.0000 mg/m2 | Freq: Once | INTRAVENOUS | Status: AC
  Administered 2014-01-15: 150 mg via INTRAVENOUS
  Filled 2014-01-15: qty 25

## 2014-01-15 MED ORDER — ONDANSETRON 8 MG/50ML IVPB (CHCC)
8.0000 mg | Freq: Once | INTRAVENOUS | Status: AC
  Administered 2014-01-15: 8 mg via INTRAVENOUS

## 2014-01-15 NOTE — Progress Notes (Signed)
Shelly Hall  [phone] [phone]) [phone]    ID: Shelly Hall OB: [birth year]  MR#: [id number]  HKV#:[id number]  PCP: No PCP Per Patient GYN:  Shelly [facility] SU: Shelly Hall OTHER MD: Shelly Hall  CHIEF COMPLAINT:  Left Breast Cancer CURRENT TREATMENT: Neoadjuvant Chemotherapy  BREAST CANCER HISTORY: Shelly Hall palpated a mass in her left breast mid-April 2015 and immediately brought it to Dr. Benjie Hall' attention. She was set up for bilateral screening mammography with tomography at [facility]19[facility]2013. The possible distortion in the left breast and a nearby group of calcifications was felt to warrant further evaluation, and on 09/30/2013 she underwent unilateral left diagnostic mammography and ultrasonography. There was an irregular mass in the outer left breast associated with microcalcifications. 4 cm anterior to this there was another cluster of pleomorphic calcifications spanning 7 mm. He had more anteriorly there was an irregular mass associated with other calcifications. In total the abnormalities in the left breast spent approximately 10 cm, and is segmental distribution. On exam there was a palpable firm mass at the 2:30 o'clock position of the left breast 10 cm from the nipple. There was palpable adenopathy in the inferior left axilla. Ultrasound confirmed an irregular hypoechoic mass in the left breast measuring 4.6 cm. In addition, a 1.4 cm oval slightly irregular mass was noted at the 3:00 position and yet another mass measured 9 mm in the same area. Ultrasound of the left axilla showed a dominant 3.6 cm lymph node.  Biopsy of 2 of the left breast masses and the suspicious left breast lymph node on 10/03/2013 showed ([id number]) an invasive ductal carcinoma, grade 2, estrogen receptor 90% positive with strong staining intensity, progesterone receptor 40% positive but moderate staining intensity, with an MIB-1 of 20% and no HER-2 amplification.  (Because all 3 biopsies were morphologically identical, only one prognostic panel was sent).  On 10/10/2013 the patient underwent bilateral breast MRI. This showed numerous enhancing masses in the left breast, the largest measuring 3.5 cm, and the aggregate measuring 12.7 cm. There were numerous enlarged left axillary lymph nodes, at least 5 of which were enlarged, both at levels 1 and 2. The largest measured 2.3 cm. The right breast was negative.  The patient's subsequent history is as detailed below.  INTERVAL HISTORY: Shelly Hall is here for follow up of her breast cancer. Today is day 1, cycle 4 of 12 planned weekly cycles of paclitaxel. She was prescribed doxycyline for a rash, but this is resolving.   REVIEW OF SYSTEMS: Shelly Hall is tolerating her chemo well, save for a few complaints. She is having 2-3 hot flashes per night that wake her up. She has peripheral neuropathy in the bilateral big toes, but this is not new and has not spread. It does not affect her ability to ambulate, or cause her to be unbalanced. She denies fever, chills, vomiting, diarrhea, and constipation. She is a little nauseous after chemo but her home meds assist with relief. A detailed review of systems is otherwise negative.  PAST MEDICAL HISTORY: Past Medical History  Diagnosis Date  . Diabetes mellitus   . Hypertension   . Sarcoidosis   . Anxiety   . Depression   . Anemia   . Complication of anesthesia     makes pt. restless and unable to be still  . Cancer     breast cancer    PAST SURGICAL HISTORY: Past Surgical History  Procedure Laterality Date  . Tubal ligation  2001  .  Endobronchial ultrasound Bilateral 12/02/2012    Procedure: ENDOBRONCHIAL ULTRASOUND;  Surgeon: Shelly Gobble, MD;  Location: [facility];  Service: Cardiopulmonary;  Laterality: Bilateral;  . Breast surgery Left     breast biopsy times 3  . Portacath placement N/A 10/22/2013    Procedure: INSERTION PORT-A-CATH;  Surgeon: Shelly Klein, MD;  Location: [facility] OR;  Service: General;  Laterality: N/A;    FAMILY HISTORY Family History  Problem Relation Age of Onset  . Cancer Paternal Aunt     "bone cancer"; deceased 73s  . Cancer Paternal Uncle     stomach cancer; deceased 44s  . Cancer Paternal Aunt     unk. primary; currently 41s  . Prostate cancer Maternal Grandfather 1  The patient's parents are living, in fair mid to late 59s. The patient had one brother, no sisters. There is no history of breast or ovarian cancer in the family to her knowledge.   GYNECOLOGIC HISTORY:   (Reviewed 11/27/2013)  menarche age 88, first live birth at 87. She is GX P2. She was still having regular periods at the time of the start of her chemotherapy in May 2015.  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 She is single and lives alone, although her mother is currently staying with her. Her son Shelly Hall lives in [place] and works at [employer]. Son Shelly Hall lives in [place] where he works at a [employer]. The patient has 1 grandchild on the way. 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 Will be taking a break to go and visit her son who lives in [place], [place]: not in place   HEALTH MAINTENANCE: (Updated 11/27/2013) History  Substance Use Topics  . Smoking status: Former Smoker -- 1.00 packs/day for 23 years    Types: Cigarettes    Quit date: 10/19/2013  . Smokeless tobacco: Never Used  . Alcohol Use: 1.0 oz/week    2 drink(s) per week     Comment: occ. x2 dks per week     Colonoscopy: Never  PAP: Not on file  Bone density: Not on file  Lipid panel:  Not on file  Allergies  Allergen Reactions  . Nyquil Multi-Symptom [Pseudoeph-Doxylamine-Dm-Apap] Itching and Other (See Comments)    Skin peels on hands, and feet    Current Outpatient Prescriptions  Medication Sig Dispense Refill  . acyclovir (ZOVIRAX)  400 MG tablet Take 1 tablet (400 mg total) by mouth 2 (two) times daily. Beginning 11/12/2013  60 tablet  4  . atenolol-chlorthalidone (TENORETIC) 50-25 MG per tablet Take 1 tablet by mouth daily.  30 tablet  1  . dexamethasone (DECADRON) 4 MG tablet Take 2 tablets (8 mg total) by mouth 2 (two) times daily with a meal. Start the day after chemotherapy for 2 days. Take with food.  30 tablet  1  . docusate sodium (COLACE) 100 MG capsule Take 100 mg by mouth daily.      . ferrous sulfate 325 (65 FE) MG tablet Take 325 mg by mouth 2 (two) times daily with a meal.      . HYDROcodone-ibuprofen (VICOPROFEN) 7.5-200 MG per tablet       . lidocaine-prilocaine (EMLA) cream Apply 1 application topically as needed. Apply over port area 1-2 hours before chemo and cover with plastic wrap  30 g  0  . Loratadine 10 MG CAPS Take 1 capsule (10 mg total) by mouth as directed.  30 each  3  . LORazepam (ATIVAN) 0.5 MG tablet Take 1 tablet (0.5 mg total) by mouth at bedtime as needed (Nausea or vomiting).  30 tablet  0  . metFORMIN (GLUCOPHAGE) 500 MG tablet Take 1 tablet (500 mg total) by mouth 2 (two) times daily with a meal.  60 tablet  1  . Multiple Vitamin (MULTIVITAMIN WITH MINERALS) TABS tablet Take 1 tablet by mouth daily.      . ondansetron (ZOFRAN) 8 MG tablet Take 1 tablet (8 mg total) by mouth 2 (two) times daily. Start the day after chemo for 2 days. Then take as needed for nausea or vomiting.  30 tablet  1  . pantoprazole (PROTONIX) 40 MG tablet Take 1 tablet (40 mg total) by mouth daily.  30 tablet  5  . potassium chloride SA (K-DUR,KLOR-CON) 20 MEQ tablet Take 20 mEq by mouth daily.      Marland Kitchen albuterol (PROVENTIL HFA;VENTOLIN HFA) 108 (90 BASE) MCG/ACT inhaler Inhale 2 puffs into the lungs every 6 (six) hours as needed for wheezing or shortness of breath.      . gabapentin (NEURONTIN) 300 MG capsule Take 1 capsule (300 mg total) by mouth 3 (three) times daily.  30 capsule  1  . oxyCODONE (OXY IR/ROXICODONE) 5  MG immediate release tablet Take 1 tablet (5 mg total) by mouth every 4 (four) hours as needed for severe pain.  30 tablet  0  . potassium chloride SA (K-DUR,KLOR-CON) 20 MEQ tablet Take 1 tablet (20 mEq total) by mouth daily.  30 tablet  6  . prochlorperazine (COMPAZINE) 10 MG tablet Take 1 tablet (10 mg total) by mouth every 6 (six) hours as needed (Nausea or vomiting).  30 tablet  1   No current facility-administered medications for this visit.   Facility-Administered Medications Ordered in Other Visits  Medication Dose Route Frequency Provider Last Rate Last Dose  . sodium chloride 0.9 % injection 10 mL  10 mL Intracatheter PRN Aasim Marla Roe, MD   10 mL at 01/15/14 1500    OBJECTIVE: Middle-aged [place] American woman in no acute distress Filed Vitals:   01/15/14 1136  BP: 128/72  Pulse: 79  Temp: 98.1 F (36.7 C)  Resp: 18     Body mass index is 30.59 kg/(m^2).    ECOG FS:1 - Symptomatic but completely ambulatory Filed Weights   01/15/14 1136  Weight: 178 lb 4.8 oz (80.876 kg)   Skin: warm, dry, facial rash resolving HEENT: sclerae anicteric, conjunctivae pink, oropharynx clear. No thrush or mucositis.  Lymph Nodes: No cervical or supraclavicular lymphadenopathy  Lungs: clear to auscultation bilaterally, no rales, wheezes, or rhonci  Heart: regular rate and rhythm  Abdomen: round, soft, non tender, positive bowel sounds  Musculoskeletal: No focal spinal tenderness, no peripheral edema  Neuro: non focal, well oriented, positive affect  Breasts: deferred   LAB RESULTS:   Lab Results  Component Value Date   WBC 6.5 01/15/2014   NEUTROABS 4.8 01/15/2014   HGB 10.3* 01/15/2014   HCT 31.7* 01/15/2014   MCV 94.0 01/15/2014   PLT 292 01/15/2014      Chemistry      Component Value Date/Time   NA 139 01/15/2014 1125   NA 140 10/21/2013 1158   K 3.7 01/15/2014 1125   K 3.9 10/21/2013 1158   CL 105 10/21/2013 1158   CO2 26 01/15/2014 1125   CO2 21 10/21/2013 1158   BUN  12.5 01/15/2014 1125   BUN 16  10/21/2013 1158   CREATININE 0.9 01/15/2014 1125   CREATININE 0.95 10/21/2013 1158      Component Value Date/Time   CALCIUM 9.8 01/15/2014 1125   CALCIUM 9.9 10/21/2013 1158   ALKPHOS 56 01/15/2014 1125   ALKPHOS 81 11/27/2012 0900   AST 22 01/15/2014 1125   AST 22 11/27/2012 0900   ALT 24 01/15/2014 1125   ALT 17 11/27/2012 0900   BILITOT <0.20 01/15/2014 1125   BILITOT 0.1* 11/27/2012 0900        STUDIES: No results found.  ASSESSMENT: 43 y.o. BRCA negative [place] woman   (1)  status post left breast and left axillary lymph node biopsy 10/03/2013, both positive for a clinical T2 N2, stage IIIA invasive ductal carcinoma, grade 2, estrogen and progesterone receptor positive, HER-2 negative, with an MIB-1 of 20%.  (2) Treated with neoadjuvant chemotherapy to consist of doxorubicin and cyclophosphamide in dose dense fashion x4, completed 12/11/2013, to be followed by paclitaxel weekly x12, today is cycle 3 of taxol which she is tolerating well. Physical exam and labs are fine.  (2) definitive surgery to follow chemotherapy  (3) radiation to follow surgery  (4) tamoxifen to be started at the end of radiation  (5) continuing tobacco abuse: The patient has been strongly advised to discontinue smoking  (6) genetic testing sent 10/16/2013 did not reveal any clearly pathogenic (harmful) mutation in any of these genes. The genes tested were ATM, BARD1, BRCA1, BRCA2, BRIP1, CDH1, CHEK2, MRE11A, MUTYH, NBN, NF1, PALB2, PTEN, RAD50, RAD51C, RAD51D, and TP53.  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 The labs were reviewed in detail with her and were stable. We will proceed with day 1, cycle 4 today.   For the hot flashes, we will start 314m gabapentin QHS. We discussed this mediation in detail, as well as the side effects. JKenideewill return to clinic next week for labs, an  office visit, and cycle 5. She knows to call with any issues that arise before her next visit here.   FMarcelino Duster NP Medical Hematologist/Oncologist CEast SidePager: [phone] No: [phone] 01/15/2014 5:04 PM

## 2014-01-15 NOTE — Patient Instructions (Signed)
[facility] Discharge Instructions for Patients Receiving Chemotherapy  Today you received the following chemotherapy agents taxol  To help prevent nausea and vomiting after your treatment, we encourage you to take your nausea medication as needed.   If you develop nausea and vomiting that is not controlled by your nausea medication, call the clinic.   BELOW ARE SYMPTOMS THAT SHOULD BE REPORTED IMMEDIATELY:  *FEVER GREATER THAN 100.5 F  *CHILLS WITH OR WITHOUT FEVER  NAUSEA AND VOMITING THAT IS NOT CONTROLLED WITH YOUR NAUSEA MEDICATION  *UNUSUAL SHORTNESS OF BREATH  *UNUSUAL BRUISING OR BLEEDING  TENDERNESS IN MOUTH AND THROAT WITH OR WITHOUT PRESENCE OF ULCERS  *URINARY PROBLEMS  *BOWEL PROBLEMS  UNUSUAL RASH Items with * indicate a potential emergency and should be followed up as soon as possible.  Feel free to call the clinic you have any questions or concerns. The clinic phone number is [phone].    

## 2014-01-22 ENCOUNTER — Ambulatory Visit (HOSPITAL_BASED_OUTPATIENT_CLINIC_OR_DEPARTMENT_OTHER): Payer: Medicaid Other | Admitting: Nurse Practitioner

## 2014-01-22 ENCOUNTER — Other Ambulatory Visit: Payer: Self-pay | Admitting: Hematology

## 2014-01-22 ENCOUNTER — Ambulatory Visit (HOSPITAL_BASED_OUTPATIENT_CLINIC_OR_DEPARTMENT_OTHER): Payer: Medicaid Other

## 2014-01-22 ENCOUNTER — Encounter: Payer: Self-pay | Admitting: Nurse Practitioner

## 2014-01-22 ENCOUNTER — Other Ambulatory Visit (HOSPITAL_BASED_OUTPATIENT_CLINIC_OR_DEPARTMENT_OTHER): Payer: No Typology Code available for payment source

## 2014-01-22 VITALS — BP 131/87 | HR 83 | Temp 98.2°F | Resp 18 | Ht 64.0 in | Wt 182.5 lb

## 2014-01-22 DIAGNOSIS — R232 Flushing: Secondary | ICD-10-CM

## 2014-01-22 DIAGNOSIS — G629 Polyneuropathy, unspecified: Secondary | ICD-10-CM

## 2014-01-22 DIAGNOSIS — C50412 Malignant neoplasm of upper-outer quadrant of left female breast: Secondary | ICD-10-CM

## 2014-01-22 DIAGNOSIS — T451X5A Adverse effect of antineoplastic and immunosuppressive drugs, initial encounter: Secondary | ICD-10-CM

## 2014-01-22 DIAGNOSIS — G620 Drug-induced polyneuropathy: Secondary | ICD-10-CM | POA: Insufficient documentation

## 2014-01-22 DIAGNOSIS — Z170 Estrogen receptor positive status [ER+]: Secondary | ICD-10-CM

## 2014-01-22 DIAGNOSIS — C773 Secondary and unspecified malignant neoplasm of axilla and upper limb lymph nodes: Secondary | ICD-10-CM

## 2014-01-22 DIAGNOSIS — C50419 Malignant neoplasm of upper-outer quadrant of unspecified female breast: Secondary | ICD-10-CM

## 2014-01-22 DIAGNOSIS — Z5111 Encounter for antineoplastic chemotherapy: Secondary | ICD-10-CM

## 2014-01-22 LAB — CBC WITH DIFFERENTIAL/PLATELET
BASO%: 0.5 % (ref 0.0–2.0)
Basophils Absolute: 0 10*3/uL (ref 0.0–0.1)
EOS%: 3.7 % (ref 0.0–7.0)
Eosinophils Absolute: 0.2 10*3/uL (ref 0.0–0.5)
HCT: 30.6 % — ABNORMAL LOW (ref 34.8–46.6)
HGB: 10 g/dL — ABNORMAL LOW (ref 11.6–15.9)
LYMPH#: 0.7 10*3/uL — AB (ref 0.9–3.3)
LYMPH%: 11.4 % — ABNORMAL LOW (ref 14.0–49.7)
MCH: 31.3 pg (ref 25.1–34.0)
MCHC: 32.5 g/dL (ref 31.5–36.0)
MCV: 96.3 fL (ref 79.5–101.0)
MONO#: 0.4 10*3/uL (ref 0.1–0.9)
MONO%: 5.9 % (ref 0.0–14.0)
NEUT%: 78.5 % — ABNORMAL HIGH (ref 38.4–76.8)
NEUTROS ABS: 4.7 10*3/uL (ref 1.5–6.5)
Platelets: 229 10*3/uL (ref 145–400)
RBC: 3.18 10*6/uL — ABNORMAL LOW (ref 3.70–5.45)
RDW: 20.9 % — AB (ref 11.2–14.5)
WBC: 5.9 10*3/uL (ref 3.9–10.3)

## 2014-01-22 LAB — COMPREHENSIVE METABOLIC PANEL (CC13)
ALBUMIN: 3.8 g/dL (ref 3.5–5.0)
ALT: 18 U/L (ref 0–55)
AST: 19 U/L (ref 5–34)
Alkaline Phosphatase: 58 U/L (ref 40–150)
Anion Gap: 10 mEq/L (ref 3–11)
BUN: 14.5 mg/dL (ref 7.0–26.0)
CALCIUM: 9.9 mg/dL (ref 8.4–10.4)
CHLORIDE: 104 meq/L (ref 98–109)
CO2: 24 meq/L (ref 22–29)
Creatinine: 0.9 mg/dL (ref 0.6–1.1)
Glucose: 178 mg/dl — ABNORMAL HIGH (ref 70–140)
POTASSIUM: 4.2 meq/L (ref 3.5–5.1)
Sodium: 137 mEq/L (ref 136–145)
Total Bilirubin: <0.2 mg/dL (ref 0.20–1.20)
Total Protein: 7.5 g/dL (ref 6.4–8.3)

## 2014-01-22 MED ORDER — SODIUM CHLORIDE 0.9 % IV SOLN
Freq: Once | INTRAVENOUS | Status: AC
  Administered 2014-01-22: 13:00:00 via INTRAVENOUS

## 2014-01-22 MED ORDER — DIPHENHYDRAMINE HCL 50 MG/ML IJ SOLN
INTRAMUSCULAR | Status: AC
  Filled 2014-01-22: qty 1

## 2014-01-22 MED ORDER — PACLITAXEL CHEMO INJECTION 300 MG/50ML
65.0000 mg/m2 | Freq: Once | INTRAVENOUS | Status: AC
  Administered 2014-01-22: 126 mg via INTRAVENOUS
  Filled 2014-01-22: qty 21

## 2014-01-22 MED ORDER — FAMOTIDINE IN NACL 20-0.9 MG/50ML-% IV SOLN
INTRAVENOUS | Status: AC
  Filled 2014-01-22: qty 50

## 2014-01-22 MED ORDER — HEPARIN SOD (PORK) LOCK FLUSH 100 UNIT/ML IV SOLN
500.0000 [IU] | Freq: Once | INTRAVENOUS | Status: AC | PRN
  Administered 2014-01-22: 500 [IU]
  Filled 2014-01-22: qty 5

## 2014-01-22 MED ORDER — ONDANSETRON 8 MG/NS 50 ML IVPB
INTRAVENOUS | Status: AC
  Filled 2014-01-22: qty 8

## 2014-01-22 MED ORDER — FAMOTIDINE IN NACL 20-0.9 MG/50ML-% IV SOLN
20.0000 mg | Freq: Once | INTRAVENOUS | Status: AC
  Administered 2014-01-22: 20 mg via INTRAVENOUS

## 2014-01-22 MED ORDER — DEXAMETHASONE SODIUM PHOSPHATE 10 MG/ML IJ SOLN
INTRAMUSCULAR | Status: AC
Start: 2014-01-22 — End: 2014-01-22
  Filled 2014-01-22: qty 1

## 2014-01-22 MED ORDER — ONDANSETRON 8 MG/50ML IVPB (CHCC)
8.0000 mg | Freq: Once | INTRAVENOUS | Status: AC
  Administered 2014-01-22: 8 mg via INTRAVENOUS

## 2014-01-22 MED ORDER — DEXAMETHASONE SODIUM PHOSPHATE 10 MG/ML IJ SOLN
4.0000 mg | Freq: Once | INTRAMUSCULAR | Status: AC
  Administered 2014-01-22: 4 mg via INTRAVENOUS

## 2014-01-22 MED ORDER — DIPHENHYDRAMINE HCL 50 MG/ML IJ SOLN
25.0000 mg | Freq: Once | INTRAMUSCULAR | Status: AC
  Administered 2014-01-22: 25 mg via INTRAVENOUS

## 2014-01-22 MED ORDER — SODIUM CHLORIDE 0.9 % IJ SOLN
10.0000 mL | INTRAMUSCULAR | Status: DC | PRN
  Administered 2014-01-22: 10 mL
  Filled 2014-01-22: qty 10

## 2014-01-22 NOTE — Patient Instructions (Signed)
[facility] Discharge Instructions for Patients Receiving Chemotherapy  Today you received the following chemotherapy agents: Taxol.  To help prevent nausea and vomiting after your treatment, we encourage you to take your nausea medication as prescribed.   If you develop nausea and vomiting that is not controlled by your nausea medication, call the clinic.   BELOW ARE SYMPTOMS THAT SHOULD BE REPORTED IMMEDIATELY:  *FEVER GREATER THAN 100.5 F  *CHILLS WITH OR WITHOUT FEVER  NAUSEA AND VOMITING THAT IS NOT CONTROLLED WITH YOUR NAUSEA MEDICATION  *UNUSUAL SHORTNESS OF BREATH  *UNUSUAL BRUISING OR BLEEDING  TENDERNESS IN MOUTH AND THROAT WITH OR WITHOUT PRESENCE OF ULCERS  *URINARY PROBLEMS  *BOWEL PROBLEMS  UNUSUAL RASH Items with * indicate a potential emergency and should be followed up as soon as possible.  Feel free to call the clinic you have any questions or concerns. The clinic phone number is [phone].    

## 2014-01-22 NOTE — Progress Notes (Signed)
[place]  [phone] [phone]    ID: Shelly Hall OB: Dec 07, 1970  MR#: [id number]  KVQ#:[id number]  PCP: No PCP Per Patient GYN:  [facility] SU: [facility] OTHER MD: Shelly Hall  CHIEF COMPLAINT:  Left Breast Cancer CURRENT TREATMENT: Neoadjuvant Chemotherapy  BREAST CANCER HISTORY: Shelly Hall palpated a mass in her left breast mid-April 2015 and immediately brought it to Dr. Benjie Hall' attention. She was set up for bilateral screening mammography with tomography at [facility] 09/19/2013. The possible distortion in the left breast and a nearby group of calcifications was felt to warrant further evaluation, and on 09/30/2013 she underwent unilateral left diagnostic mammography and ultrasonography. There was an irregular mass in the outer left breast associated with microcalcifications. 4 cm anterior to this there was another cluster of pleomorphic calcifications spanning 7 mm. He had more anteriorly there was an irregular mass associated with other calcifications. In total the abnormalities in the left breast spent approximately 10 cm, and is segmental distribution. On exam there was a palpable firm mass at the 2:30 o'clock position of the left breast 10 cm from the nipple. There was palpable adenopathy in the inferior left axilla. Ultrasound confirmed an irregular hypoechoic mass in the left breast measuring 4.6 cm. In addition, a 1.4 cm oval slightly irregular mass was noted at the 3:00 position and yet another mass measured 9 mm in the same area. Ultrasound of the left axilla showed a dominant 3.6 cm lymph node.  Biopsy of 2 of the left breast masses and the suspicious left breast lymph node on 10/03/2013 showed ([id number]) an invasive ductal carcinoma, grade 2, estrogen receptor 90% positive with strong staining intensity, progesterone receptor 40% positive but moderate staining intensity, with an MIB-1 of 20% and no HER-2 amplification.  (Because all 3 biopsies were morphologically identical, only one prognostic panel was sent).  On 10/10/2013 the patient underwent bilateral breast MRI. This showed numerous enhancing masses in the left breast, the largest measuring 3.5 cm, and the aggregate measuring 12.7 cm. There were numerous enlarged left axillary lymph nodes, at least 5 of which were enlarged, both at levels 1 and 2. The largest measured 2.3 cm. The right breast was negative.  The patient's subsequent history is as detailed below.  INTERVAL HISTORY: Shelly Hall is here for follow up of her breast cancer. Today is day 1, cycle 5 of 12 planned weekly cycles of paclitaxel. Shelly Hall states she is doing well with chemo. She was a little nauseous after her last infusion, but the home meds assisted with relief, she denies vomiting. Her hot flashes have diminished, but the peripheral neuropathy in the bilateral big toes has persisted through the gabapentin. Additionally she has experienced "pinpricks" in her bilateral hands up to her wrist. She started feeling this sensation last week.   REVIEW OF SYSTEMS: Shelly Hall denies fevers, chills, mouth sores, Hall bowel/bladder changes. A detailed review of systems was otherwise negative.   PAST MEDICAL HISTORY: Past Medical History  Diagnosis Date  . Diabetes mellitus   . Hypertension   . Sarcoidosis   . Anxiety   . Depression   . Anemia   . Complication of anesthesia     makes pt. restless and unable to be still  . Cancer     breast cancer    PAST SURGICAL HISTORY: Past Surgical History  Procedure Laterality Date  . Tubal ligation  2001  . Endobronchial ultrasound Bilateral 12/02/2012    Procedure: ENDOBRONCHIAL ULTRASOUND;  Surgeon:  Shelly Gobble, MD;  [facility];  Service: Cardiopulmonary;  Laterality: Bilateral;  . Breast surgery Left     breast biopsy times 3  . Portacath placement N/A 10/22/2013    Procedure: INSERTION PORT-A-CATH;  Surgeon: Shelly Klein, MD;   Location: [facility];  Service: General;  Laterality: N/A;    FAMILY HISTORY Family History  Problem Relation Age of Onset  . Cancer Paternal Aunt     "bone cancer"; deceased 29s  . Cancer Paternal Uncle     stomach cancer; deceased 31s  . Cancer Paternal Aunt     unk. primary; currently 43s  . Prostate cancer Maternal Grandfather 54  The patient's parents are living, in fair mid to late 56s. The patient had one brother, no sisters. There is no history of breast Hall ovarian cancer in the family to her knowledge.   GYNECOLOGIC HISTORY:   (Reviewed 11/27/2013)  menarche age 36, first live birth at 32. She is GX P2. She was still having regular periods at the time of the start of her chemotherapy in May 2015.  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 She is single and lives alone, although her mother is currently staying with her. Her son Shelly Hall lives in [place] and works at [employer]. Son Shelly Hall lives in [place] where he works at a [employer]. The patient has 1 grandchild on the way. 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 Will be taking a break to go and visit her son who lives in [place], [place]: not in place   HEALTH MAINTENANCE: (Updated 11/27/2013) History  Substance Use Topics  . Smoking status: Former Smoker -- 1.00 packs/day for 23 years    Types: Cigarettes    Quit date: 10/19/2013  . Smokeless tobacco: Never Used  . Alcohol Use: 1.0 oz/week    2 drink(s) per week     Comment: occ. x2 dks per week     Colonoscopy: Never  PAP: Not on file  Bone density: Not on file  Lipid panel:  Not on file  Allergies  Allergen Reactions  . Nyquil Multi-Symptom [Pseudoeph-Doxylamine-Dm-Apap] Itching and Other (See Comments)    Skin peels on hands, and feet    Current Outpatient Prescriptions  Medication Sig Dispense Refill  . acyclovir (ZOVIRAX) 400 MG tablet  Take 1 tablet (400 mg total) by mouth 2 (two) times daily. Beginning 11/12/2013  60 tablet  4  . albuterol (PROVENTIL HFA;VENTOLIN HFA) 108 (90 BASE) MCG/ACT inhaler Inhale 2 puffs into the lungs every 6 (six) hours as needed for wheezing Hall shortness of breath.      Marland Kitchen atenolol-chlorthalidone (TENORETIC) 50-25 MG per tablet Take 1 tablet by mouth daily.  30 tablet  1  . dexamethasone (DECADRON) 4 MG tablet Take 2 tablets (8 mg total) by mouth 2 (two) times daily with a meal. Start the day after chemotherapy for 2 days. Take with food.  30 tablet  1  . docusate sodium (COLACE) 100 MG capsule Take 100 mg by mouth daily.      . ferrous sulfate 325 (65 FE) MG tablet Take 325 mg by mouth 2 (two) times daily with a meal.      . gabapentin (NEURONTIN) 300 MG capsule Take 300 mg by mouth at bedtime.      Marland Kitchen HYDROcodone-ibuprofen (VICOPROFEN) 7.5-200 MG per tablet       . lidocaine-prilocaine (EMLA) cream Apply 1  application topically as needed. Apply over port area 1-2 hours before chemo and cover with plastic wrap  30 g  0  . Loratadine 10 MG CAPS Take 1 capsule (10 mg total) by mouth as directed.  30 each  3  . LORazepam (ATIVAN) 0.5 MG tablet Take 1 tablet (0.5 mg total) by mouth at bedtime as needed (Nausea Hall vomiting).  30 tablet  0  . metFORMIN (GLUCOPHAGE) 500 MG tablet Take 1 tablet (500 mg total) by mouth 2 (two) times daily with a meal.  60 tablet  1  . Multiple Vitamin (MULTIVITAMIN WITH MINERALS) TABS tablet Take 1 tablet by mouth daily.      . ondansetron (ZOFRAN) 8 MG tablet Take 1 tablet (8 mg total) by mouth 2 (two) times daily. Start the day after chemo for 2 days. Then take as needed for nausea Hall vomiting.  30 tablet  1  . oxyCODONE (OXY IR/ROXICODONE) 5 MG immediate release tablet Take 1 tablet (5 mg total) by mouth every 4 (four) hours as needed for severe pain.  30 tablet  0  . pantoprazole (PROTONIX) 40 MG tablet Take 1 tablet (40 mg total) by mouth daily.  30 tablet  5  . potassium  chloride SA (K-DUR,KLOR-CON) 20 MEQ tablet Take 1 tablet (20 mEq total) by mouth daily.  30 tablet  6  . potassium chloride SA (K-DUR,KLOR-CON) 20 MEQ tablet Take 20 mEq by mouth daily.      . prochlorperazine (COMPAZINE) 10 MG tablet Take 1 tablet (10 mg total) by mouth every 6 (six) hours as needed (Nausea Hall vomiting).  30 tablet  1   No current facility-administered medications for this visit.   Facility-Administered Medications Ordered in Other Visits  Medication Dose Route Frequency Provider Last Rate Last Dose  . heparin lock flush 100 unit/mL  500 Units Intracatheter Once PRN Aasim Marla Roe, MD      . PACLitaxel (TAXOL) 126 mg in dextrose 5 % 250 mL chemo infusion (</= 72m/m2)  65 mg/m2 (Treatment Plan Actual) Intravenous Once Aasim SMarla Roe MD 271 mL/hr at 01/22/14 1340 126 mg at 01/22/14 1340  . sodium chloride 0.9 % injection 10 mL  10 mL Intracatheter PRN Aasim SMarla Roe MD        OBJECTIVE: Middle-aged African American woman in no acute distress Filed Vitals:   01/22/14 1141  BP: 131/87  Pulse: 83  Temp: 98.2 F (36.8 C)  Resp: 18     Body mass index is 31.31 kg/(m^2).    ECOG FS:1 - Symptomatic but completely ambulatory  Filed Weights   01/22/14 1141  Weight: 182 lb 8 oz (82.781 kg)   Skin: warm, dry, bilateral palms hyperpigmented Sclerae unicteric, pupils equal and reactive Oropharynx clear and moist-- no thrush No cervical Hall supraclavicular adenopathy Lungs no rales Hall rhonchi Heart regular rate and rhythm Abd soft, nontender, positive bowel sounds MSK no focal spinal tenderness, no upper extremity lymphedema Neuro: nonfocal, well oriented, appropriate affect Breasts: deferred   LAB RESULTS:   Lab Results  Component Value Date   WBC 5.9 01/22/2014   NEUTROABS 4.7 01/22/2014   HGB 10.0* 01/22/2014   HCT 30.6* 01/22/2014   MCV 96.3 01/22/2014   PLT 229 01/22/2014      Chemistry      Component Value Date/Time   NA 137 01/22/2014 1129    NA 140 10/21/2013 1158   K 4.2 01/22/2014 1129   K 3.9 10/21/2013 1158   CL 105  10/21/2013 1158   CO2 24 01/22/2014 1129   CO2 21 10/21/2013 1158   BUN 14.5 01/22/2014 1129   BUN 16 10/21/2013 1158   CREATININE 0.9 01/22/2014 1129   CREATININE 0.95 10/21/2013 1158      Component Value Date/Time   CALCIUM 9.9 01/22/2014 1129   CALCIUM 9.9 10/21/2013 1158   ALKPHOS 58 01/22/2014 1129   ALKPHOS 81 11/27/2012 0900   AST 19 01/22/2014 1129   AST 22 11/27/2012 0900   ALT 18 01/22/2014 1129   ALT 17 11/27/2012 0900   BILITOT <0.20 01/22/2014 1129   BILITOT 0.1* 11/27/2012 0900        STUDIES: No results found.  ASSESSMENT: 43 y.o. BRCA negative [facility] woman   (1)  status post left breast and left axillary lymph node biopsy 10/03/2013, both positive for a clinical T2 N2, stage IIIA invasive ductal carcinoma, grade 2, estrogen and progesterone receptor positive, HER-2 negative, with an MIB-1 of 20%.  (2) Treated with neoadjuvant chemotherapy to consist of doxorubicin and cyclophosphamide in dose dense fashion x4, completed 12/11/2013, to be followed by paclitaxel weekly x12, today is cycle 3 of taxol which she is tolerating well. Physical exam and labs are fine.  (2) definitive surgery to follow chemotherapy  (3) radiation to follow surgery  (4) tamoxifen to be started at the end of radiation  (5) continuing tobacco abuse: The patient has been strongly advised to discontinue smoking  (6) genetic testing sent 10/16/2013 did not reveal any clearly pathogenic (harmful) mutation in any of these genes. The genes tested were ATM, BARD1, BRCA1, BRCA2, BRIP1, CDH1, CHEK2, MRE11A, MUTYH, NBN, NF1, PALB2, PTEN, RAD50, RAD51C, RAD51D, and TP53.  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 The labs were reviewed in detail with her and were stable. Her peripheral neuropathy makes a case for toxicity. Dr. Lona Kettle was  consulted, he agreed to dropping the dose by 20% for all future infusions. He also advised continuing the gabapentin nightly.   Sumi will continue day 1 cycle 5 of the dose reduced paclitaxel. She will return next weeks for labs and an office visit. She understands and agrees with the plan. She knows to call with any issues that might arise before her next visit here.    Marcelino Duster, NP 01/22/2014 2:07 PM

## 2014-01-28 ENCOUNTER — Telehealth: Payer: Self-pay | Admitting: Oncology

## 2014-01-29 ENCOUNTER — Other Ambulatory Visit (HOSPITAL_BASED_OUTPATIENT_CLINIC_OR_DEPARTMENT_OTHER): Payer: No Typology Code available for payment source

## 2014-01-29 ENCOUNTER — Ambulatory Visit (HOSPITAL_BASED_OUTPATIENT_CLINIC_OR_DEPARTMENT_OTHER): Payer: Medicaid Other

## 2014-01-29 ENCOUNTER — Ambulatory Visit (HOSPITAL_BASED_OUTPATIENT_CLINIC_OR_DEPARTMENT_OTHER): Payer: Medicaid Other | Admitting: Oncology

## 2014-01-29 ENCOUNTER — Ambulatory Visit: Payer: No Typology Code available for payment source

## 2014-01-29 VITALS — BP 128/76 | HR 93 | Temp 97.9°F | Resp 18 | Ht 64.0 in | Wt 184.5 lb

## 2014-01-29 DIAGNOSIS — Z5111 Encounter for antineoplastic chemotherapy: Secondary | ICD-10-CM

## 2014-01-29 DIAGNOSIS — C50919 Malignant neoplasm of unspecified site of unspecified female breast: Secondary | ICD-10-CM

## 2014-01-29 DIAGNOSIS — C50412 Malignant neoplasm of upper-outer quadrant of left female breast: Secondary | ICD-10-CM

## 2014-01-29 DIAGNOSIS — C773 Secondary and unspecified malignant neoplasm of axilla and upper limb lymph nodes: Secondary | ICD-10-CM

## 2014-01-29 DIAGNOSIS — D701 Agranulocytosis secondary to cancer chemotherapy: Secondary | ICD-10-CM

## 2014-01-29 DIAGNOSIS — D869 Sarcoidosis, unspecified: Secondary | ICD-10-CM

## 2014-01-29 DIAGNOSIS — T451X5A Adverse effect of antineoplastic and immunosuppressive drugs, initial encounter: Secondary | ICD-10-CM

## 2014-01-29 DIAGNOSIS — E0789 Other specified disorders of thyroid: Secondary | ICD-10-CM

## 2014-01-29 DIAGNOSIS — F17200 Nicotine dependence, unspecified, uncomplicated: Secondary | ICD-10-CM

## 2014-01-29 DIAGNOSIS — Z170 Estrogen receptor positive status [ER+]: Secondary | ICD-10-CM

## 2014-01-29 LAB — CBC WITH DIFFERENTIAL/PLATELET
BASO%: 0.3 % (ref 0.0–2.0)
Basophils Absolute: 0 10*3/uL (ref 0.0–0.1)
EOS%: 2.6 % (ref 0.0–7.0)
Eosinophils Absolute: 0.2 10*3/uL (ref 0.0–0.5)
HCT: 33.9 % — ABNORMAL LOW (ref 34.8–46.6)
HEMOGLOBIN: 11 g/dL — AB (ref 11.6–15.9)
LYMPH%: 10.3 % — ABNORMAL LOW (ref 14.0–49.7)
MCH: 31.6 pg (ref 25.1–34.0)
MCHC: 32.4 g/dL (ref 31.5–36.0)
MCV: 97.4 fL (ref 79.5–101.0)
MONO#: 0.4 10*3/uL (ref 0.1–0.9)
MONO%: 5.1 % (ref 0.0–14.0)
NEUT#: 5.7 10*3/uL (ref 1.5–6.5)
NEUT%: 81.7 % — AB (ref 38.4–76.8)
PLATELETS: 268 10*3/uL (ref 145–400)
RBC: 3.48 10*6/uL — ABNORMAL LOW (ref 3.70–5.45)
RDW: 18.5 % — ABNORMAL HIGH (ref 11.2–14.5)
WBC: 6.9 10*3/uL (ref 3.9–10.3)
lymph#: 0.7 10*3/uL — ABNORMAL LOW (ref 0.9–3.3)

## 2014-01-29 LAB — COMPREHENSIVE METABOLIC PANEL (CC13)
ALK PHOS: 65 U/L (ref 40–150)
ALT: 35 U/L (ref 0–55)
AST: 23 U/L (ref 5–34)
Albumin: 3.9 g/dL (ref 3.5–5.0)
Anion Gap: 10 mEq/L (ref 3–11)
BILIRUBIN TOTAL: 0.38 mg/dL (ref 0.20–1.20)
BUN: 14.6 mg/dL (ref 7.0–26.0)
CHLORIDE: 102 meq/L (ref 98–109)
CO2: 25 mEq/L (ref 22–29)
CREATININE: 0.9 mg/dL (ref 0.6–1.1)
Calcium: 9.9 mg/dL (ref 8.4–10.4)
Glucose: 214 mg/dl — ABNORMAL HIGH (ref 70–140)
Potassium: 3.7 mEq/L (ref 3.5–5.1)
Sodium: 137 mEq/L (ref 136–145)
Total Protein: 8 g/dL (ref 6.4–8.3)

## 2014-01-29 MED ORDER — HEPARIN SOD (PORK) LOCK FLUSH 100 UNIT/ML IV SOLN
500.0000 [IU] | Freq: Once | INTRAVENOUS | Status: AC | PRN
  Administered 2014-01-29: 500 [IU]
  Filled 2014-01-29: qty 5

## 2014-01-29 MED ORDER — ONDANSETRON 8 MG/50ML IVPB (CHCC)
8.0000 mg | Freq: Once | INTRAVENOUS | Status: AC
Start: 2014-01-29 — End: 2014-01-29
  Administered 2014-01-29: 8 mg via INTRAVENOUS

## 2014-01-29 MED ORDER — DIPHENHYDRAMINE HCL 50 MG/ML IJ SOLN
INTRAMUSCULAR | Status: AC
  Filled 2014-01-29: qty 1

## 2014-01-29 MED ORDER — DIPHENHYDRAMINE HCL 50 MG/ML IJ SOLN
25.0000 mg | Freq: Once | INTRAMUSCULAR | Status: AC
  Administered 2014-01-29: 25 mg via INTRAVENOUS

## 2014-01-29 MED ORDER — FAMOTIDINE IN NACL 20-0.9 MG/50ML-% IV SOLN
INTRAVENOUS | Status: AC
  Filled 2014-01-29: qty 50

## 2014-01-29 MED ORDER — SODIUM CHLORIDE 0.9 % IV SOLN
Freq: Once | INTRAVENOUS | Status: AC
  Administered 2014-01-29: 12:00:00 via INTRAVENOUS

## 2014-01-29 MED ORDER — FAMOTIDINE IN NACL 20-0.9 MG/50ML-% IV SOLN
20.0000 mg | Freq: Once | INTRAVENOUS | Status: AC
  Administered 2014-01-29: 20 mg via INTRAVENOUS

## 2014-01-29 MED ORDER — ONDANSETRON 8 MG/NS 50 ML IVPB
INTRAVENOUS | Status: AC
  Filled 2014-01-29: qty 8

## 2014-01-29 MED ORDER — SODIUM CHLORIDE 0.9 % IJ SOLN
10.0000 mL | INTRAMUSCULAR | Status: DC | PRN
  Administered 2014-01-29: 10 mL
  Filled 2014-01-29: qty 10

## 2014-01-29 MED ORDER — DEXAMETHASONE SODIUM PHOSPHATE 10 MG/ML IJ SOLN
4.0000 mg | Freq: Once | INTRAMUSCULAR | Status: AC
  Administered 2014-01-29: 4 mg via INTRAVENOUS

## 2014-01-29 MED ORDER — DEXAMETHASONE SODIUM PHOSPHATE 10 MG/ML IJ SOLN
INTRAMUSCULAR | Status: AC
  Filled 2014-01-29: qty 1

## 2014-01-29 MED ORDER — PACLITAXEL CHEMO INJECTION 300 MG/50ML
65.0000 mg/m2 | Freq: Once | INTRAVENOUS | Status: AC
  Administered 2014-01-29: 126 mg via INTRAVENOUS
  Filled 2014-01-29: qty 21

## 2014-01-29 NOTE — Patient Instructions (Signed)
[facility] Discharge Instructions for Patients Receiving Chemotherapy  Today you received the following chemotherapy agents Tacol  To help prevent nausea and vomiting after your treatment, we encourage you to take your nausea medication as prescribed   If you develop nausea and vomiting that is not controlled by your nausea medication, call the clinic.   BELOW ARE SYMPTOMS THAT SHOULD BE REPORTED IMMEDIATELY:  *FEVER GREATER THAN 100.5 F  *CHILLS WITH OR WITHOUT FEVER  NAUSEA AND VOMITING THAT IS NOT CONTROLLED WITH YOUR NAUSEA MEDICATION  *UNUSUAL SHORTNESS OF BREATH  *UNUSUAL BRUISING OR BLEEDING  TENDERNESS IN MOUTH AND THROAT WITH OR WITHOUT PRESENCE OF ULCERS  *URINARY PROBLEMS  *BOWEL PROBLEMS  UNUSUAL RASH Items with * indicate a potential emergency and should be followed up as soon as possible.  Feel free to call the clinic you have any questions or concerns. The clinic phone number is [phone].

## 2014-01-29 NOTE — Progress Notes (Signed)
[place]  [phone] [phone]    ID: Shelly Hall OB: 02-Apr-1971  MR#: [id number]  TDD#:[id number]  PCP: No PCP Per Patient GYN:  [name] SU: [name] OTHER MD: Thea Silversmith  CHIEF COMPLAINT:  Left Breast Cancer CURRENT TREATMENT: Neoadjuvant Chemotherapy  BREAST CANCER HISTORY: Shelly Hall palpated a mass in her left breast mid-April 2015 and immediately brought it to Dr. Benjie Karvonen' attention. She was set up for bilateral screening mammography with tomography at [facility] 09/19/2013. The possible distortion in the left breast and a nearby group of calcifications was felt to warrant further evaluation, and on 09/30/2013 she underwent unilateral left diagnostic mammography and ultrasonography. There was an irregular mass in the outer left breast associated with microcalcifications. 4 cm anterior to this there was another cluster of pleomorphic calcifications spanning 7 mm. He had more anteriorly there was an irregular mass associated with other calcifications. In total the abnormalities in the left breast spent approximately 10 cm, and is segmental distribution. On exam there was a palpable firm mass at the 2:30 o'clock position of the left breast 10 cm from the nipple. There was palpable adenopathy in the inferior left axilla. Ultrasound confirmed an irregular hypoechoic mass in the left breast measuring 4.6 cm. In addition, a 1.4 cm oval slightly irregular mass was noted at the 3:00 position and yet another mass measured 9 mm in the same area. Ultrasound of the left axilla showed a dominant 3.6 cm lymph node.  Biopsy of 2 of the left breast masses and the suspicious left breast lymph node on 10/03/2013 showed ([id number]) an invasive ductal carcinoma, grade 2, estrogen receptor 90% positive with strong staining intensity, progesterone receptor 40% positive but moderate staining intensity, with an MIB-1 of 20% and no HER-2 amplification.  (Because all 3 biopsies were morphologically identical, only one prognostic panel was sent).  On 10/10/2013 the patient underwent bilateral breast MRI. This showed numerous enhancing masses in the left breast, the largest measuring 3.5 cm, and the aggregate measuring 12.7 cm. There were numerous enlarged left axillary lymph nodes, at least 5 of which were enlarged, both at levels 1 and 2. The largest measured 2.3 cm. The right breast was negative.  The patient's subsequent history is as detailed below.  INTERVAL HISTORY: Shelly Hall returns today for followup up of her breast cancer. Today is day 1, cycle 6 of 12 planned weekly cycles of paclitaxel. The paclitaxel dose was decreased by 20% with cycle 5 because of concerns regarding peripheral neuropathy. What she describes though is a "prickly feeling" affecting her wrists and lower arms and to some extent her feet. This early would be a very unusual form of neuropathy. She does have some numbness in her big toes bilaterally. She has no numbness in her hands at all.  REVIEW OF SYSTEMS: She feels a lot better with a single agent Taxol than she did with the combined treatment earlier. She has more energy, goes out of the house more, and is eating more. Her sense of taste does change with treatment but that it gets back to normal. She is having absolutely no nausea, using only prochlorperazine prophylactically. Bowel movements are normal. She does have a little bit of blurred vision, and hot flashes. Her sugars are moderately well-controlled. A detailed review of systems today was otherwise noncontributory  PAST MEDICAL HISTORY: Past Medical History  Diagnosis Date  . Diabetes mellitus   . Hypertension   . Sarcoidosis   . Anxiety   .  Depression   . Anemia   . Complication of anesthesia     makes pt. restless and unable to be still  . Cancer     breast cancer    PAST SURGICAL HISTORY: Past Surgical History  Procedure Laterality Date  . Tubal  ligation  2001  . Endobronchial ultrasound Bilateral 12/02/2012    Procedure: ENDOBRONCHIAL ULTRASOUND;  Surgeon: Collene Gobble, MD;  Location: [facility] END[facility];  Service: Cardiopulmonary;  Laterality: Bilateral;  . Breast surgery Left     breast biopsy times 3  . Portacath placement N/A 10/22/2013    Procedure: INSERTION PORT-A-CATH;  Surgeon: [name], MD;  Location: [facility] OR;  Service: General;  Laterality: N/A;    FAMILY HISTORY Family History  Problem Relation Age of Onset  . Cancer Paternal Aunt     "bone cancer"; deceased 4s  . Cancer Paternal Uncle     stomach cancer; deceased 110s  . Cancer Paternal Aunt     unk. primary; currently 31s  . Prostate cancer Maternal Grandfather 8  The patient's parents are living, in fair mid to late 19s. The patient had one brother, no sisters. There is no history of breast or ovarian cancer in the family to her knowledge.   GYNECOLOGIC HISTORY:   (Reviewed 11/27/2013)  menarche age 4, first live birth at 38. She is GX P2. She was still having regular periods at the time of the start of her chemotherapy in May 2015.  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 She is single and lives alone, although her mother is currently staying with her. Her son Shelly Hall lives in [place] and works at [employer]. Son Shelly Hall lives in [place] where he works at a [employer]. The patient has 1 grandchild on the way. 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 Will be taking a break to go and visit her son who lives in [place], [place]: not in place   HEALTH MAINTENANCE: (Updated 11/27/2013) History  Substance Use Topics  . Smoking status: Former Smoker -- 1.00 packs/day for 23 years    Types: Cigarettes    Quit date: 10/19/2013  . Smokeless tobacco: Never Used  . Alcohol Use: 1.0 oz/week    2 drink(s) per week     Comment: occ. x2 dks per week      Colonoscopy: Never  PAP: Not on file  Bone density: Not on file  Lipid panel:  Not on file  Allergies  Allergen Reactions  . Nyquil Multi-Symptom [Pseudoeph-Doxylamine-Dm-Apap] Itching and Other (See Comments)    Skin peels on hands, and feet    Current Outpatient Prescriptions  Medication Sig Dispense Refill  . acyclovir (ZOVIRAX) 400 MG tablet Take 1 tablet (400 mg total) by mouth 2 (two) times daily. Beginning 11/12/2013  60 tablet  4  . albuterol (PROVENTIL HFA;VENTOLIN HFA) 108 (90 BASE) MCG/ACT inhaler Inhale 2 puffs into the lungs every 6 (six) hours as needed for wheezing or shortness of breath.      Marland Kitchen atenolol-chlorthalidone (TENORETIC) 50-25 MG per tablet Take 1 tablet by mouth daily.  30 tablet  1  . dexamethasone (DECADRON) 4 MG tablet Take 2 tablets (8 mg total) by mouth 2 (two) times daily with a meal. Start the day after chemotherapy for 2 days. Take with food.  30 tablet  1  . docusate sodium (COLACE) 100 MG capsule Take 100 mg by mouth daily.      Marland Kitchen  ferrous sulfate 325 (65 FE) MG tablet Take 325 mg by mouth 2 (two) times daily with a meal.      . gabapentin (NEURONTIN) 300 MG capsule Take 300 mg by mouth at bedtime.      Marland Kitchen HYDROcodone-ibuprofen (VICOPROFEN) 7.5-200 MG per tablet       . lidocaine-prilocaine (EMLA) cream Apply 1 application topically as needed. Apply over port area 1-2 hours before chemo and cover with plastic wrap  30 g  0  . Loratadine 10 MG CAPS Take 1 capsule (10 mg total) by mouth as directed.  30 each  3  . LORazepam (ATIVAN) 0.5 MG tablet Take 1 tablet (0.5 mg total) by mouth at bedtime as needed (Nausea or vomiting).  30 tablet  0  . metFORMIN (GLUCOPHAGE) 500 MG tablet Take 1 tablet (500 mg total) by mouth 2 (two) times daily with a meal.  60 tablet  1  . Multiple Vitamin (MULTIVITAMIN WITH MINERALS) TABS tablet Take 1 tablet by mouth daily.      . ondansetron (ZOFRAN) 8 MG tablet Take 1 tablet (8 mg total) by mouth 2 (two) times daily. Start  the day after chemo for 2 days. Then take as needed for nausea or vomiting.  30 tablet  1  . oxyCODONE (OXY IR/ROXICODONE) 5 MG immediate release tablet Take 1 tablet (5 mg total) by mouth every 4 (four) hours as needed for severe pain.  30 tablet  0  . pantoprazole (PROTONIX) 40 MG tablet Take 1 tablet (40 mg total) by mouth daily.  30 tablet  5  . potassium chloride SA (K-DUR,KLOR-CON) 20 MEQ tablet Take 1 tablet (20 mEq total) by mouth daily.  30 tablet  6  . potassium chloride SA (K-DUR,KLOR-CON) 20 MEQ tablet Take 20 mEq by mouth daily.      . prochlorperazine (COMPAZINE) 10 MG tablet Take 1 tablet (10 mg total) by mouth every 6 (six) hours as needed (Nausea or vomiting).  30 tablet  1   No current facility-administered medications for this visit.    OBJECTIVE: Middle-aged [place] American woman in no acute distress Filed Vitals:   01/29/14 1047  BP: 128/76  Pulse: 93  Temp: 97.9 F (36.6 C)  Resp: 18     Body mass index is 31.65 kg/(m^2).    ECOG FS:1 - Symptomatic but completely ambulatory   Sclerae unicteric, pupils round and equal Oropharynx clear and moist-- no thrush or other lesions noted No cervical or supraclavicular adenopathy Lungs no rales or rhonchi Heart regular rate and rhythm Abd soft, nontender, positive bowel sounds MSK no focal spinal tenderness, no upper extremity lymphedema Neuro: nonfocal, well oriented, appropriate affect Breasts: The right breast is unremarkable. I do not palpate a mass in the left breast. There are no skin or nipple changes of concern. The left axilla is benign.   LAB RESULTS:   Lab Results  Component Value Date   WBC 6.9 01/29/2014   NEUTROABS 5.7 01/29/2014   HGB 11.0* 01/29/2014   HCT 33.9* 01/29/2014   MCV 97.4 01/29/2014   PLT 268 01/29/2014      Chemistry      Component Value Date/Time   NA 137 01/22/2014 1129   NA 140 10/21/2013 1158   K 4.2 01/22/2014 1129   K 3.9 10/21/2013 1158   CL 105 10/21/2013 1158   CO2 24  01/22/2014 1129   CO2 21 10/21/2013 1158   BUN 14.5 01/22/2014 1129   BUN 16 10/21/2013 1158  CREATININE 0.9 01/22/2014 1129   CREATININE 0.95 10/21/2013 1158      Component Value Date/Time   CALCIUM 9.9 01/22/2014 1129   CALCIUM 9.9 10/21/2013 1158   ALKPHOS 58 01/22/2014 1129   ALKPHOS 81 11/27/2012 0900   AST 19 01/22/2014 1129   AST 22 11/27/2012 0900   ALT 18 01/22/2014 1129   ALT 17 11/27/2012 0900   BILITOT <0.20 01/22/2014 1129   BILITOT 0.1* 11/27/2012 0900        STUDIES: EXAM:  BILATERAL BREAST MRI WITH AND WITHOUT CONTRAST  TECHNIQUE:  Multiplanar, multisequence MR images of both breasts were obtained  prior to and following the intravenous administration of 16 ml of  Multihance.  THREE-DIMENSIONAL MR IMAGE RENDERING ON INDEPENDENT WORKSTATION:  Three-dimensional MR images were rendered by post-processing of the  original MR data on an independent workstation. The  three-dimensional MR images were interpreted, and findings are  reported in the following complete MRI report for this study. Three  dimensional images were evaluated at the independent DynaCad  workstation.  COMPARISON: Bilateral breast MRI 10/10/2013. Mammography  10/03/2013, 09/30/2013, dating back to 04/08/2009. Left breast  ultrasound 10/03/2013, 09/30/2013.  FINDINGS:  Breast composition: b. Scattered fibroglandular tissue.  Background parenchymal enhancement: Mild.  Right breast: No suspicious mass or abnormal enhancement.  Left breast: Interval reduction in the overall volume of the  segmental enhancing tumor in the lower outer left breast. (For the  purposes of the measurements that I will give, I measured the  segmental enhancement on the DynaCAD workstation at similar levels  on the previous and the current MRI, so my measurements for the  prior examination will be different than originally stated in that  report).  The overall AP length of the segmental enhancement is unchanged at  approximately  13 cm. However, there has been marked reduction in  tumor volume, with the maximum width currently approximating 3.5 cm  (previously 4.8 cm by my measurement), and the maximum height  currently approximating 4.3 cm (previously 5.9 cm by my  measurement). In addition, the enhancement kinetics of the segmental  tumor have improved.  No new or suspicious findings elsewhere in the left breast.  Lymph nodes: Interval marked reduction in size of the previously  identified left axillary lymph nodes, the largest node which  previously measured approximately 2.8 cm currently measuring  approximately 1.7 cm, with more normal-appearing morphology. No new  or enlarging lymphadenopathy.  Ancillary findings: None.  IMPRESSION:  1. Interval marked reduction in volume of the segmental enhancing  tumor in the lower outer left breast, consistent with a good  response to chemotherapy. Measurements are given above.  2. Interval marked reduction in size of the previously identified  left axillary lymph nodes.  3. No new or suspicious findings elsewhere in the left breast.  4. No findings suspicious for malignancy in the right breast.  RECOMMENDATION:  Treatment plan.  BI-RADS CATEGORY 6: Known biopsy-proven malignancy.  Electronically Signed  By: Evangeline Dakin M.D.  On: 12/16/2013 15:50    ASSESSMENT: 43 y.o. BRCA negative [place] woman   (1)  status post left breast and left axillary lymph node biopsy 10/03/2013, both positive for a clinical T2 N2, stage IIIA invasive ductal carcinoma, grade 2, estrogen and progesterone receptor positive, HER-2 negative, with an MIB-1 of 20%.  (2) Treated with neoadjuvant chemotherapy to consist of doxorubicin and cyclophosphamide in dose dense fashion x4, completed 12/11/2013, to be followed by paclitaxel weekly x12, today is cycle 3 of  taxol which she is tolerating well. Physical exam and labs are fine.  (2) definitive surgery to follow chemotherapy  (3)  radiation to follow surgery  (4) tamoxifen to be started at the end of radiation  (5) continuing tobacco abuse: The patient has been strongly advised to discontinue smoking  (6) genetic testing sent 10/16/2013 did not reveal any clearly pathogenic (harmful) mutation in any of these genes. The genes tested were ATM, BARD1, BRCA1, BRCA2, BRIP1, CDH1, CHEK2, MRE11A, MUTYH, NBN, NF1, PALB2, PTEN, RAD50, RAD51C, RAD51D, and TP53.  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 We are concerned about neuropathy in this patient with a history of diabetes. The only real evidence of neuropathy so far though is numbness in both large toes. He "prickly feelings" she is having in her forearms are really not going to be related to nerve damage and are very transient in any case.  We are accordingly continuing the weekly paclitaxel, but at a reduced dose. She is going to miss the September 17 treatment because she will be visiting her son after the birth of the patient's first granddaughter. This means you should be done with her treatments October 16. I anticipate she will be having her definitive surgery early November. She was thinking that she would want a mastectomy. I have encouraged her to consider a lumpectomy strongly, since there will be no survival advantage to mastectomy and she has a very large right breast that it would be very difficult to balance.  She has a good understanding of the overall plan. She agrees with it. She knows the goal of treatment in her case is cure. She will call with any problems that may develop before next visit here.  Chauncey Cruel, MD 01/29/2014 10:50 AM

## 2014-02-02 ENCOUNTER — Other Ambulatory Visit: Payer: Self-pay | Admitting: Oncology

## 2014-02-05 ENCOUNTER — Encounter: Payer: Self-pay | Admitting: Nurse Practitioner

## 2014-02-05 ENCOUNTER — Telehealth: Payer: Self-pay | Admitting: Nurse Practitioner

## 2014-02-05 ENCOUNTER — Telehealth: Payer: Self-pay | Admitting: *Deleted

## 2014-02-05 ENCOUNTER — Ambulatory Visit: Payer: No Typology Code available for payment source

## 2014-02-05 ENCOUNTER — Ambulatory Visit (HOSPITAL_BASED_OUTPATIENT_CLINIC_OR_DEPARTMENT_OTHER): Payer: Medicaid Other

## 2014-02-05 ENCOUNTER — Other Ambulatory Visit (HOSPITAL_BASED_OUTPATIENT_CLINIC_OR_DEPARTMENT_OTHER): Payer: Medicaid Other

## 2014-02-05 ENCOUNTER — Ambulatory Visit (HOSPITAL_BASED_OUTPATIENT_CLINIC_OR_DEPARTMENT_OTHER): Payer: No Typology Code available for payment source | Admitting: Nurse Practitioner

## 2014-02-05 VITALS — BP 133/84 | HR 89 | Temp 98.1°F | Resp 18 | Ht 64.0 in | Wt 181.3 lb

## 2014-02-05 DIAGNOSIS — Z5111 Encounter for antineoplastic chemotherapy: Secondary | ICD-10-CM

## 2014-02-05 DIAGNOSIS — F17200 Nicotine dependence, unspecified, uncomplicated: Secondary | ICD-10-CM

## 2014-02-05 DIAGNOSIS — G620 Drug-induced polyneuropathy: Secondary | ICD-10-CM

## 2014-02-05 DIAGNOSIS — C773 Secondary and unspecified malignant neoplasm of axilla and upper limb lymph nodes: Secondary | ICD-10-CM

## 2014-02-05 DIAGNOSIS — Z170 Estrogen receptor positive status [ER+]: Secondary | ICD-10-CM

## 2014-02-05 DIAGNOSIS — T451X5A Adverse effect of antineoplastic and immunosuppressive drugs, initial encounter: Principal | ICD-10-CM

## 2014-02-05 DIAGNOSIS — C50412 Malignant neoplasm of upper-outer quadrant of left female breast: Secondary | ICD-10-CM

## 2014-02-05 DIAGNOSIS — D497 Neoplasm of unspecified behavior of endocrine glands and other parts of nervous system: Secondary | ICD-10-CM

## 2014-02-05 DIAGNOSIS — C50919 Malignant neoplasm of unspecified site of unspecified female breast: Secondary | ICD-10-CM

## 2014-02-05 DIAGNOSIS — R232 Flushing: Secondary | ICD-10-CM

## 2014-02-05 DIAGNOSIS — C50419 Malignant neoplasm of upper-outer quadrant of unspecified female breast: Secondary | ICD-10-CM

## 2014-02-05 DIAGNOSIS — Z23 Encounter for immunization: Secondary | ICD-10-CM

## 2014-02-05 LAB — CBC WITH DIFFERENTIAL/PLATELET
BASO%: 0.1 % (ref 0.0–2.0)
Basophils Absolute: 0 10*3/uL (ref 0.0–0.1)
EOS%: 2.2 % (ref 0.0–7.0)
Eosinophils Absolute: 0.2 10*3/uL (ref 0.0–0.5)
HCT: 33.2 % — ABNORMAL LOW (ref 34.8–46.6)
HEMOGLOBIN: 10.7 g/dL — AB (ref 11.6–15.9)
LYMPH%: 11 % — AB (ref 14.0–49.7)
MCH: 31.9 pg (ref 25.1–34.0)
MCHC: 32.2 g/dL (ref 31.5–36.0)
MCV: 99.1 fL (ref 79.5–101.0)
MONO#: 0.5 10*3/uL (ref 0.1–0.9)
MONO%: 6.6 % (ref 0.0–14.0)
NEUT%: 80.1 % — AB (ref 38.4–76.8)
NEUTROS ABS: 5.4 10*3/uL (ref 1.5–6.5)
Platelets: 295 10*3/uL (ref 145–400)
RBC: 3.35 10*6/uL — AB (ref 3.70–5.45)
RDW: 17.4 % — ABNORMAL HIGH (ref 11.2–14.5)
WBC: 6.8 10*3/uL (ref 3.9–10.3)
lymph#: 0.8 10*3/uL — ABNORMAL LOW (ref 0.9–3.3)

## 2014-02-05 LAB — COMPREHENSIVE METABOLIC PANEL (CC13)
ALK PHOS: 70 U/L (ref 40–150)
ALT: 30 U/L (ref 0–55)
ANION GAP: 10 meq/L (ref 3–11)
AST: 23 U/L (ref 5–34)
Albumin: 3.8 g/dL (ref 3.5–5.0)
BUN: 15.9 mg/dL (ref 7.0–26.0)
CO2: 26 mEq/L (ref 22–29)
CREATININE: 1 mg/dL (ref 0.6–1.1)
Calcium: 9.6 mg/dL (ref 8.4–10.4)
Chloride: 102 mEq/L (ref 98–109)
Glucose: 192 mg/dl — ABNORMAL HIGH (ref 70–140)
Potassium: 3.8 mEq/L (ref 3.5–5.1)
Sodium: 139 mEq/L (ref 136–145)
Total Bilirubin: 0.39 mg/dL (ref 0.20–1.20)
Total Protein: 7.8 g/dL (ref 6.4–8.3)

## 2014-02-05 MED ORDER — FAMOTIDINE IN NACL 20-0.9 MG/50ML-% IV SOLN
INTRAVENOUS | Status: AC
  Filled 2014-02-05: qty 50

## 2014-02-05 MED ORDER — DIPHENHYDRAMINE HCL 50 MG/ML IJ SOLN
25.0000 mg | Freq: Once | INTRAMUSCULAR | Status: AC
  Administered 2014-02-05: 25 mg via INTRAVENOUS

## 2014-02-05 MED ORDER — DIPHENHYDRAMINE HCL 50 MG/ML IJ SOLN
INTRAMUSCULAR | Status: AC
  Filled 2014-02-05: qty 1

## 2014-02-05 MED ORDER — HEPARIN SOD (PORK) LOCK FLUSH 100 UNIT/ML IV SOLN
500.0000 [IU] | Freq: Once | INTRAVENOUS | Status: AC | PRN
  Administered 2014-02-05: 500 [IU]
  Filled 2014-02-05: qty 5

## 2014-02-05 MED ORDER — DEXAMETHASONE SODIUM PHOSPHATE 10 MG/ML IJ SOLN
INTRAMUSCULAR | Status: AC
  Filled 2014-02-05: qty 1

## 2014-02-05 MED ORDER — ONDANSETRON 8 MG/50ML IVPB (CHCC)
8.0000 mg | Freq: Once | INTRAVENOUS | Status: AC
  Administered 2014-02-05: 8 mg via INTRAVENOUS

## 2014-02-05 MED ORDER — DEXAMETHASONE SODIUM PHOSPHATE 10 MG/ML IJ SOLN
4.0000 mg | Freq: Once | INTRAMUSCULAR | Status: AC
  Administered 2014-02-05: 4 mg via INTRAVENOUS

## 2014-02-05 MED ORDER — SODIUM CHLORIDE 0.9 % IJ SOLN
10.0000 mL | INTRAMUSCULAR | Status: DC | PRN
  Administered 2014-02-05: 10 mL
  Filled 2014-02-05: qty 10

## 2014-02-05 MED ORDER — SODIUM CHLORIDE 0.9 % IV SOLN
Freq: Once | INTRAVENOUS | Status: AC
  Administered 2014-02-05: 12:00:00 via INTRAVENOUS

## 2014-02-05 MED ORDER — PACLITAXEL CHEMO INJECTION 300 MG/50ML
65.0000 mg/m2 | Freq: Once | INTRAVENOUS | Status: AC
  Administered 2014-02-05: 126 mg via INTRAVENOUS
  Filled 2014-02-05: qty 21

## 2014-02-05 MED ORDER — INFLUENZA VAC SPLIT QUAD 0.5 ML IM SUSY
0.5000 mL | PREFILLED_SYRINGE | Freq: Once | INTRAMUSCULAR | Status: AC
  Administered 2014-02-05: 0.5 mL via INTRAMUSCULAR
  Filled 2014-02-05: qty 0.5

## 2014-02-05 MED ORDER — FAMOTIDINE IN NACL 20-0.9 MG/50ML-% IV SOLN
20.0000 mg | Freq: Once | INTRAVENOUS | Status: AC
  Administered 2014-02-05: 20 mg via INTRAVENOUS

## 2014-02-05 MED ORDER — ONDANSETRON 8 MG/NS 50 ML IVPB
INTRAVENOUS | Status: AC
  Filled 2014-02-05: qty 8

## 2014-02-05 NOTE — Telephone Encounter (Signed)
per pof to sch appt-sch-sent MW email to move trmt to coordinate MD appt-will call pt once reply

## 2014-02-05 NOTE — Telephone Encounter (Signed)
Per staff message I have adjusted appts. Advised scheduler

## 2014-02-05 NOTE — Progress Notes (Signed)
[place]  Telephone:(336) [phone] Fax:(336) [phone]    ID: Shelly Hall OB: 1970/06/19  MR#: [id number]  FWY#:[id number]  PCP: No PCP Per Patient GYN:  [name] SU: [name] OTHER MD: Thea Silversmith  CHIEF COMPLAINT:  Left Breast Cancer CURRENT TREATMENT: Neoadjuvant Chemotherapy  BREAST CANCER HISTORY: Rainee palpated a mass in her left breast mid-April 2015 and immediately brought it to Dr. Benjie Karvonen' attention. She was set up for bilateral screening mammography with tomography at [facility] hospital 09/19/2013. The possible distortion in the left breast and a nearby group of calcifications was felt to warrant further evaluation, and on 09/30/2013 she underwent unilateral left diagnostic mammography and ultrasonography. There was an irregular mass in the outer left breast associated with microcalcifications. 4 cm anterior to this there was another cluster of pleomorphic calcifications spanning 7 mm. He had more anteriorly there was an irregular mass associated with other calcifications. In total the abnormalities in the left breast spent approximately 10 cm, and is segmental distribution. On exam there was a palpable firm mass at the 2:30 o'clock position of the left breast 10 cm from the nipple. There was palpable adenopathy in the inferior left axilla. Ultrasound confirmed an irregular hypoechoic mass in the left breast measuring 4.6 cm. In addition, a 1.4 cm oval slightly irregular mass was noted at the 3:00 position and yet another mass measured 9 mm in the same area. Ultrasound of the left axilla showed a dominant 3.6 cm lymph node.  Biopsy of 2 of the left breast masses and the suspicious left breast lymph node on 10/03/2013 showed ([id number]) an invasive ductal carcinoma, grade 2, estrogen receptor 90% positive with strong staining intensity, progesterone receptor 40% positive but moderate staining intensity, with an MIB-1 of 20% and no HER-2 amplification.  (Because all 3 biopsies were morphologically identical, only one prognostic panel was sent).  On 10/10/2013 the patient underwent bilateral breast MRI. This showed numerous enhancing masses in the left breast, the largest measuring 3.5 cm, and the aggregate measuring 12.7 cm. There were numerous enlarged left axillary lymph nodes, at least 5 of which were enlarged, both at levels 1 and 2. The largest measured 2.3 cm. The right breast was negative.  The patient's subsequent history is as detailed below.  INTERVAL HISTORY: Tyreesha returns today for follow up of her breast cancer. Today is day 1, cycle 7 of 12 planned weekly cycle of paclitaxel. The paclitaxel dose was decrease by 20% with cycle 5 after peripheral neuropathy concerns. She continues to experience numbness to her bilateral great toes,but no longer feels prickly sensations to her bilateral forearms. She is on 330m gabapentin nightly for her hot flashes and she is happy with this.   REVIEW OF SYSTEMS: Jayln denies fevers, chills, nausea, vomiting, or changes in bowel habits. She does not take steroids at home, only before her infusion because of her history of diabetes. Her energy is better and so is her appetite. Her sense of taste changes after chemo, but it reverts to normal with time. Her blurred vision has not worsened. A detailed review of systems was otherwise noncontributory.    PAST MEDICAL HISTORY: Past Medical History  Diagnosis Date  . Diabetes mellitus   . Hypertension   . Sarcoidosis   . Anxiety   . Depression   . Anemia   . Complication of anesthesia     makes pt. restless and unable to be still  . Cancer     breast cancer  PAST SURGICAL HISTORY: Past Surgical History  Procedure Laterality Date  . Tubal ligation  2001  . Endobronchial ultrasound Bilateral 12/02/2012    Procedure: ENDOBRONCHIAL ULTRASOUND;  Surgeon: Collene Gobble, MD;  Location: [facility] ENDOSCOPY;  Service: Cardiopulmonary;  Laterality:  Bilateral;  . Breast surgery Left     breast biopsy times 3  . Portacath placement N/A 10/22/2013    Procedure: INSERTION PORT-A-CATH;  Surgeon: [name], MD;  Location: [facility] OR;  Service: General;  Laterality: N/A;    FAMILY HISTORY Family History  Problem Relation Age of Onset  . Cancer Paternal Aunt     "bone cancer"; deceased 68s  . Cancer Paternal Uncle     stomach cancer; deceased 58s  . Cancer Paternal Aunt     unk. primary; currently 4s  . Prostate cancer Maternal Grandfather 69  The patient's parents are living, in fair mid to late 106s. The patient had one brother, no sisters. There is no history of breast or ovarian cancer in the family to her knowledge.   GYNECOLOGIC HISTORY:   (Reviewed 11/27/2013)  menarche age 55, first live birth at 18. She is GX P2. She was still having regular periods at the time of the start of her chemotherapy in May 2015.  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 She is single and lives alone, although her mother is currently staying with her. Her son Mardie Kellen lives in [place] and works at [employer]. Son [name] lives in [place] where he works at a [employer]. The patient has 1 grandchild on the way. 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 Will be taking a break to go and visit her son who lives in [place], [place]: not in place   HEALTH MAINTENANCE: (Updated 11/27/2013) History  Substance Use Topics  . Smoking status: Former Smoker -- 1.00 packs/day for 23 years    Types: Cigarettes    Quit date: 10/19/2013  . Smokeless tobacco: Never Used  . Alcohol Use: 1.0 oz/week    2 drink(s) per week     Comment: occ. x2 dks per week     Colonoscopy: Never  PAP: Not on file  Bone density: Not on file  Lipid panel:  Not on file  Allergies  Allergen Reactions  . Nyquil Multi-Symptom [Pseudoeph-Doxylamine-Dm-Apap] Itching  and Other (See Comments)    Skin peels on hands, and feet    Current Outpatient Prescriptions  Medication Sig Dispense Refill  . acyclovir (ZOVIRAX) 400 MG tablet Take 1 tablet (400 mg total) by mouth 2 (two) times daily. Beginning 11/12/2013  60 tablet  4  . albuterol (PROVENTIL HFA;VENTOLIN HFA) 108 (90 BASE) MCG/ACT inhaler Inhale 2 puffs into the lungs every 6 (six) hours as needed for wheezing or shortness of breath.      Marland Kitchen atenolol-chlorthalidone (TENORETIC) 50-25 MG per tablet Take 1 tablet by mouth daily.  30 tablet  1  . dexamethasone (DECADRON) 4 MG tablet Take 2 tablets (8 mg total) by mouth 2 (two) times daily with a meal. Start the day after chemotherapy for 2 days. Take with food.  30 tablet  1  . docusate sodium (COLACE) 100 MG capsule Take 100 mg by mouth daily.      . ferrous sulfate 325 (65 FE) MG tablet Take 325 mg by mouth 2 (two) times daily with a meal.      . gabapentin (NEURONTIN) 300 MG capsule Take  300 mg by mouth at bedtime.      Marland Kitchen HYDROcodone-ibuprofen (VICOPROFEN) 7.5-200 MG per tablet       . lidocaine-prilocaine (EMLA) cream Apply 1 application topically as needed. Apply over port area 1-2 hours before chemo and cover with plastic wrap  30 g  0  . Loratadine 10 MG CAPS Take 1 capsule (10 mg total) by mouth as directed.  30 each  3  . LORazepam (ATIVAN) 0.5 MG tablet Take 1 tablet (0.5 mg total) by mouth at bedtime as needed (Nausea or vomiting).  30 tablet  0  . metFORMIN (GLUCOPHAGE) 500 MG tablet Take 1 tablet (500 mg total) by mouth 2 (two) times daily with a meal.  60 tablet  1  . Multiple Vitamin (MULTIVITAMIN WITH MINERALS) TABS tablet Take 1 tablet by mouth daily.      . ondansetron (ZOFRAN) 8 MG tablet Take 1 tablet (8 mg total) by mouth 2 (two) times daily. Start the day after chemo for 2 days. Then take as needed for nausea or vomiting.  30 tablet  1  . oxyCODONE (OXY IR/ROXICODONE) 5 MG immediate release tablet Take 1 tablet (5 mg total) by mouth every 4  (four) hours as needed for severe pain.  30 tablet  0  . pantoprazole (PROTONIX) 40 MG tablet Take 1 tablet (40 mg total) by mouth daily.  30 tablet  5  . potassium chloride SA (K-DUR,KLOR-CON) 20 MEQ tablet Take 1 tablet (20 mEq total) by mouth daily.  30 tablet  6  . potassium chloride SA (K-DUR,KLOR-CON) 20 MEQ tablet Take 20 mEq by mouth daily.      . prochlorperazine (COMPAZINE) 10 MG tablet Take 1 tablet (10 mg total) by mouth every 6 (six) hours as needed (Nausea or vomiting).  30 tablet  1   No current facility-administered medications for this visit.    OBJECTIVE: Middle-aged [place] American woman in no acute distress Filed Vitals:   02/05/14 1128  BP: 133/84  Pulse: 89  Temp: 98.1 F (36.7 C)  Resp: 18     Body mass index is 31.1 kg/(m^2).    ECOG FS:1 - Symptomatic but completely ambulatory   Skin: warm, dry, bilateral palms hyperpigmented HEENT: sclerae anicteric, conjunctivae pink, oropharynx clear. No thrush or mucositis.  Lymph Nodes: No cervical or supraclavicular lymphadenopathy  Lungs: clear to auscultation bilaterally, no rales, wheezes, or rhonci  Heart: regular rate and rhythm  Abdomen: round, soft, non tender, positive bowel sounds  Musculoskeletal: No focal spinal tenderness, no peripheral edema  Neuro: non focal, well oriented, positive affect  Breasts: deferred   LAB RESULTS:   Lab Results  Component Value Date   WBC 6.8 02/05/2014   NEUTROABS 5.4 02/05/2014   HGB 10.7* 02/05/2014   HCT 33.2* 02/05/2014   MCV 99.1 02/05/2014   PLT 295 02/05/2014      Chemistry      Component Value Date/Time   NA 139 02/05/2014 1105   NA 140 10/21/2013 1158   K 3.8 02/05/2014 1105   K 3.9 10/21/2013 1158   CL 105 10/21/2013 1158   CO2 26 02/05/2014 1105   CO2 21 10/21/2013 1158   BUN 15.9 02/05/2014 1105   BUN 16 10/21/2013 1158   CREATININE 1.0 02/05/2014 1105   CREATININE 0.95 10/21/2013 1158      Component Value Date/Time   CALCIUM 9.6 02/05/2014 1105   CALCIUM 9.9 10/21/2013  1158   ALKPHOS 70 02/05/2014 1105   ALKPHOS 81 11/27/2012  0900   AST 23 02/05/2014 1105   AST 22 11/27/2012 0900   ALT 30 02/05/2014 1105   ALT 17 11/27/2012 0900   BILITOT 0.39 02/05/2014 1105   BILITOT 0.1* 11/27/2012 0900        STUDIES: No results found.  ASSESSMENT: 43 y.o. BRCA negative [place] woman   (1)  status post left breast and left axillary lymph node biopsy 10/03/2013, both positive for a clinical T2 N2, stage IIIA invasive ductal carcinoma, grade 2, estrogen and progesterone receptor positive, HER-2 negative, with an MIB-1 of 20%.  (2) Treated with neoadjuvant chemotherapy to consist of doxorubicin and cyclophosphamide in dose dense fashion x4, completed 12/11/2013, followed by paclitaxel weekly x12 (dose reduced by 20% at cycle 5 because of neuropathy concerns)  (2) definitive surgery to follow chemotherapy  (3) radiation to follow surgery  (4) tamoxifen to be started at the end of radiation  (5) continuing tobacco abuse: The patient has been strongly advised to discontinue smoking  (6) genetic testing sent 10/16/2013 did not reveal any clearly pathogenic (harmful) mutation in any of these genes. The genes tested were ATM, BARD1, BRCA1, BRCA2, BRIP1, CDH1, CHEK2, MRE11A, MUTYH, NBN, NF1, PALB2, PTEN, RAD50, RAD51C, RAD51D, and TP53.  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 The labs were reviewed in detail with her and were stable. We will proceed with day 1, cycle 7 today and continue the dose reduction.   Eithel will return for cycle 8 next week, but skip the sept 17th treatment as she will be visiting her son after the birth of the patient's first granddaughter. She states her daughter-in-law is already 2 cm as of this morning! Accordingly she will be done with treatments around October 16 and will have definitve surgery early November. She discussed with Dr. Jana Hakim last week  about her options for this changed her mind from a mastectomy to a lumpectomy after learning there is no increased survival advantage to the former.   Lonita understands and agrees with this plan. She knows the goal of treatment in her case is cure. She has been encouraged to call with any issues that might arise before her next visit here.   Marcelino Duster, NP 02/05/2014 11:52 AM

## 2014-02-05 NOTE — Patient Instructions (Signed)
[facility] Discharge Instructions for Patients Receiving Chemotherapy  Today you received the following chemotherapy agents: Taxol.  To help prevent nausea and vomiting after your treatment, we encourage you to take your nausea medication as prescribed.   If you develop nausea and vomiting that is not controlled by your nausea medication, call the clinic.   BELOW ARE SYMPTOMS THAT SHOULD BE REPORTED IMMEDIATELY:  *FEVER GREATER THAN 100.5 F  *CHILLS WITH OR WITHOUT FEVER  NAUSEA AND VOMITING THAT IS NOT CONTROLLED WITH YOUR NAUSEA MEDICATION  *UNUSUAL SHORTNESS OF BREATH  *UNUSUAL BRUISING OR BLEEDING  TENDERNESS IN MOUTH AND THROAT WITH OR WITHOUT PRESENCE OF ULCERS  *URINARY PROBLEMS  *BOWEL PROBLEMS  UNUSUAL RASH Items with * indicate a potential emergency and should be followed up as soon as possible.  Feel free to call the clinic you have any questions or concerns. The clinic phone number is [phone].    

## 2014-02-06 ENCOUNTER — Telehealth: Payer: Self-pay | Admitting: Oncology

## 2014-02-06 NOTE — Telephone Encounter (Signed)
cld pt to adv of sch-pt req to have a copy mailed to her. Printed & mailed copy to pt

## 2014-02-12 ENCOUNTER — Ambulatory Visit (HOSPITAL_BASED_OUTPATIENT_CLINIC_OR_DEPARTMENT_OTHER): Payer: No Typology Code available for payment source

## 2014-02-12 ENCOUNTER — Encounter: Payer: Self-pay | Admitting: Nurse Practitioner

## 2014-02-12 ENCOUNTER — Telehealth: Payer: Self-pay | Admitting: Nurse Practitioner

## 2014-02-12 ENCOUNTER — Other Ambulatory Visit (HOSPITAL_BASED_OUTPATIENT_CLINIC_OR_DEPARTMENT_OTHER): Payer: No Typology Code available for payment source

## 2014-02-12 ENCOUNTER — Ambulatory Visit (HOSPITAL_BASED_OUTPATIENT_CLINIC_OR_DEPARTMENT_OTHER): Payer: No Typology Code available for payment source | Admitting: Nurse Practitioner

## 2014-02-12 ENCOUNTER — Other Ambulatory Visit: Payer: Self-pay | Admitting: Oncology

## 2014-02-12 VITALS — BP 125/93 | HR 83 | Temp 98.1°F | Resp 20 | Ht 64.0 in | Wt 182.6 lb

## 2014-02-12 DIAGNOSIS — C773 Secondary and unspecified malignant neoplasm of axilla and upper limb lymph nodes: Secondary | ICD-10-CM

## 2014-02-12 DIAGNOSIS — C50412 Malignant neoplasm of upper-outer quadrant of left female breast: Secondary | ICD-10-CM

## 2014-02-12 DIAGNOSIS — G620 Drug-induced polyneuropathy: Secondary | ICD-10-CM

## 2014-02-12 DIAGNOSIS — R232 Flushing: Secondary | ICD-10-CM

## 2014-02-12 DIAGNOSIS — C50919 Malignant neoplasm of unspecified site of unspecified female breast: Secondary | ICD-10-CM

## 2014-02-12 DIAGNOSIS — Z5111 Encounter for antineoplastic chemotherapy: Secondary | ICD-10-CM

## 2014-02-12 DIAGNOSIS — T451X5A Adverse effect of antineoplastic and immunosuppressive drugs, initial encounter: Principal | ICD-10-CM

## 2014-02-12 DIAGNOSIS — Z170 Estrogen receptor positive status [ER+]: Secondary | ICD-10-CM

## 2014-02-12 DIAGNOSIS — C73 Malignant neoplasm of thyroid gland: Secondary | ICD-10-CM

## 2014-02-12 LAB — CBC WITH DIFFERENTIAL/PLATELET
BASO%: 0.3 % (ref 0.0–2.0)
BASOS ABS: 0 10*3/uL (ref 0.0–0.1)
EOS%: 1.6 % (ref 0.0–7.0)
Eosinophils Absolute: 0.1 10*3/uL (ref 0.0–0.5)
HCT: 32 % — ABNORMAL LOW (ref 34.8–46.6)
HEMOGLOBIN: 10.4 g/dL — AB (ref 11.6–15.9)
LYMPH#: 0.7 10*3/uL — AB (ref 0.9–3.3)
LYMPH%: 12.6 % — ABNORMAL LOW (ref 14.0–49.7)
MCH: 32.4 pg (ref 25.1–34.0)
MCHC: 32.4 g/dL (ref 31.5–36.0)
MCV: 100 fL (ref 79.5–101.0)
MONO#: 0.4 10*3/uL (ref 0.1–0.9)
MONO%: 7.8 % (ref 0.0–14.0)
NEUT#: 4.3 10*3/uL (ref 1.5–6.5)
NEUT%: 77.7 % — ABNORMAL HIGH (ref 38.4–76.8)
Platelets: 290 10*3/uL (ref 145–400)
RBC: 3.2 10*6/uL — AB (ref 3.70–5.45)
RDW: 17.3 % — AB (ref 11.2–14.5)
WBC: 5.5 10*3/uL (ref 3.9–10.3)

## 2014-02-12 LAB — COMPREHENSIVE METABOLIC PANEL (CC13)
ALBUMIN: 3.8 g/dL (ref 3.5–5.0)
ALK PHOS: 61 U/L (ref 40–150)
ALT: 28 U/L (ref 0–55)
AST: 23 U/L (ref 5–34)
Anion Gap: 10 mEq/L (ref 3–11)
BUN: 15.1 mg/dL (ref 7.0–26.0)
CALCIUM: 9.7 mg/dL (ref 8.4–10.4)
CO2: 25 mEq/L (ref 22–29)
Chloride: 105 mEq/L (ref 98–109)
Creatinine: 0.9 mg/dL (ref 0.6–1.1)
Glucose: 168 mg/dl — ABNORMAL HIGH (ref 70–140)
POTASSIUM: 4.3 meq/L (ref 3.5–5.1)
SODIUM: 140 meq/L (ref 136–145)
TOTAL PROTEIN: 7.8 g/dL (ref 6.4–8.3)
Total Bilirubin: 0.3 mg/dL (ref 0.20–1.20)

## 2014-02-12 MED ORDER — ONDANSETRON 8 MG/NS 50 ML IVPB
INTRAVENOUS | Status: AC
  Filled 2014-02-12: qty 8

## 2014-02-12 MED ORDER — ONDANSETRON 8 MG/50ML IVPB (CHCC)
8.0000 mg | Freq: Once | INTRAVENOUS | Status: AC
  Administered 2014-02-12: 8 mg via INTRAVENOUS

## 2014-02-12 MED ORDER — DEXAMETHASONE SODIUM PHOSPHATE 10 MG/ML IJ SOLN
4.0000 mg | Freq: Once | INTRAMUSCULAR | Status: AC
  Administered 2014-02-12: 4 mg via INTRAVENOUS

## 2014-02-12 MED ORDER — FAMOTIDINE IN NACL 20-0.9 MG/50ML-% IV SOLN
INTRAVENOUS | Status: AC
  Filled 2014-02-12: qty 50

## 2014-02-12 MED ORDER — FAMOTIDINE IN NACL 20-0.9 MG/50ML-% IV SOLN
20.0000 mg | Freq: Once | INTRAVENOUS | Status: AC
  Administered 2014-02-12: 20 mg via INTRAVENOUS

## 2014-02-12 MED ORDER — HEPARIN SOD (PORK) LOCK FLUSH 100 UNIT/ML IV SOLN
500.0000 [IU] | Freq: Once | INTRAVENOUS | Status: AC | PRN
  Administered 2014-02-12: 500 [IU]
  Filled 2014-02-12: qty 5

## 2014-02-12 MED ORDER — DIPHENHYDRAMINE HCL 50 MG/ML IJ SOLN
25.0000 mg | Freq: Once | INTRAMUSCULAR | Status: AC
  Administered 2014-02-12: 25 mg via INTRAVENOUS

## 2014-02-12 MED ORDER — DIPHENHYDRAMINE HCL 50 MG/ML IJ SOLN
INTRAMUSCULAR | Status: AC
  Filled 2014-02-12: qty 1

## 2014-02-12 MED ORDER — DEXTROSE 5 % IV SOLN
65.0000 mg/m2 | Freq: Once | INTRAVENOUS | Status: AC
  Administered 2014-02-12: 126 mg via INTRAVENOUS
  Filled 2014-02-12: qty 21

## 2014-02-12 MED ORDER — SODIUM CHLORIDE 0.9 % IJ SOLN
10.0000 mL | INTRAMUSCULAR | Status: DC | PRN
  Administered 2014-02-12: 10 mL
  Filled 2014-02-12: qty 10

## 2014-02-12 MED ORDER — DEXAMETHASONE SODIUM PHOSPHATE 10 MG/ML IJ SOLN
INTRAMUSCULAR | Status: AC
  Filled 2014-02-12: qty 1

## 2014-02-12 MED ORDER — SODIUM CHLORIDE 0.9 % IV SOLN
Freq: Once | INTRAVENOUS | Status: AC
  Administered 2014-02-12: 13:00:00 via INTRAVENOUS

## 2014-02-12 NOTE — Telephone Encounter (Signed)
sch appts per pof-sent MW email to coordinate trmt w/MD appt on 10/8-senf HF to see if pt needs to be sch w/LC on 10/156 because no openings on her sch for that day. Will contact pt once reply

## 2014-02-12 NOTE — Progress Notes (Signed)
[place]  [phone] [phone]    ID: Shelly Hall OB: Jul 06, 45 1972  MR#: [id number]  GPQ#:[id number]  PCP: No PCP Per Patient GYN:  [name] SU: [name] OTHER MD: Thea Silversmith  CHIEF COMPLAINT:  Left Breast Cancer CURRENT TREATMENT: Neoadjuvant Chemotherapy  BREAST CANCER HISTORY: [name] palpated a mass in her left breast mid-April 2015 and immediately brought it to Dr. Benjie Karvonen' attention. She was set up for bilateral screening mammography with tomography at [facility] 09/19/2013. The possible distortion in the left breast and a nearby group of calcifications was felt to warrant further evaluation, and on 09/30/2013 she underwent unilateral left diagnostic mammography and ultrasonography. There was an irregular mass in the outer left breast associated with microcalcifications. 4 cm anterior to this there was another cluster of pleomorphic calcifications spanning 7 mm. He had more anteriorly there was an irregular mass associated with other calcifications. In total the abnormalities in the left breast spent approximately 10 cm, and is segmental distribution. On exam there was a palpable firm mass at the 2:30 o'clock position of the left breast 10 cm from the nipple. There was palpable adenopathy in the inferior left axilla. Ultrasound confirmed an irregular hypoechoic mass in the left breast measuring 4.6 cm. In addition, a 1.4 cm oval slightly irregular mass was noted at the 3:00 position and yet another mass measured 9 mm in the same area. Ultrasound of the left axilla showed a dominant 3.6 cm lymph node.  Biopsy of 2 of the left breast masses and the suspicious left breast lymph node on 10/03/2013 showed ([id number]) an invasive ductal carcinoma, grade 2, estrogen receptor 90% positive with strong staining intensity, progesterone receptor 40% positive but moderate staining intensity, with an MIB-1 of 20% and no HER-2 amplification.  (Because all 3 biopsies were morphologically identical, only one prognostic panel was sent).  On 10/10/2013 the patient underwent bilateral breast MRI. This showed numerous enhancing masses in the left breast, the largest measuring 3.5 cm, and the aggregate measuring 12.7 cm. There were numerous enlarged left axillary lymph nodes, at least 5 of which were enlarged, both at levels 1 and 2. The largest measured 2.3 cm. The right breast was negative.  The patient's subsequent history is as detailed below.  INTERVAL HISTORY: Eh returns today for follow up of her breast cancer. Today is day 1, cycle 8 of 12 planned weekly cycles of paclitaxel. The paclitaxel dose was decrease by 20% with cycle 5 after peripheral neuropathy concerns. She continues to experience numbness to her bilateral great toes. She is on 329m gabapentin nightly for her hot flashes and this is satisfactory to her. The patient is very excited to finish treatment today so that she can get on the road to go experience the birth of her first grandchild!  REVIEW OF SYSTEMS: Shelly Hall denies fevers, chills, nausea, vomiting, or changes in bowel habits. Her appetite is unchanged, but she occasionally experiences a perversion in her taste sensation after chemo. She states no change in her vision. She denies shortness of breath, chest pain, palpitations, or fatigue. A detailed review of systems was otherwise noncontributory.    PAST MEDICAL HISTORY: Past Medical History  Diagnosis Date  . Diabetes mellitus   . Hypertension   . Sarcoidosis   . Anxiety   . Depression   . Anemia   . Complication of anesthesia     makes pt. restless and unable to be still  . Cancer  breast cancer    PAST SURGICAL HISTORY: Past Surgical History  Procedure Laterality Date  . Tubal ligation  2001  . Endobronchial ultrasound Bilateral 12/02/2012    Procedure: ENDOBRONCHIAL ULTRASOUND;  Surgeon: Collene Gobble, MD;  Location: [facility] END[facility]COPY;   Service: Cardiopulmonary;  Laterality: Bilateral;  . Breast surgery Left     breast biopsy times 3  . Portacath placement N/A 10/22/2013    Procedure: INSERTION PORT-A-CATH;  Surgeon: [name], MD;  Location: [facility] OR;  Service: General;  Laterality: N/A;    FAMILY HISTORY Family History  Problem Relation Age of Onset  . Cancer Paternal Aunt     "bone cancer"; deceased 61s  . Cancer Paternal Uncle     stomach cancer; deceased 21s  . Cancer Paternal Aunt     unk. primary; currently 80s  . Prostate cancer Maternal Grandfather 35  The patient's parents are living, in fair mid to late 15s. The patient had one brother, no sisters. There is no history of breast or ovarian cancer in the family to her knowledge.   GYNECOLOGIC HISTORY:   (Reviewed 11/27/2013)  menarche age 43, first live birth at 23. She is GX P2. She was still having regular periods at the time of the start of her chemotherapy in May 2015.  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 She is single and lives alone, although her mother is currently staying with her. Her son Shelly Hall lives in [place] and works at [employer]. Son Shelly Hall lives in [place] where he works at a [employer]. The patient has 1 grandchild on the way. 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 Will be taking a break to go and visit her son who lives in [place], [place]: not in place   HEALTH MAINTENANCE: (Updated 11/27/2013) History  Substance Use Topics  . Smoking status: Former Smoker -- 1.00 packs/day for 23 years    Types: Cigarettes    Quit date: 10/19/2013  . Smokeless tobacco: Never Used  . Alcohol Use: 1.0 oz/week    2 drink(s) per week     Comment: occ. x2 dks per week     Colonoscopy: Never  PAP: Not on file  Bone density: Not on file  Lipid panel:  Not on file  Allergies  Allergen Reactions  . Nyquil Multi-Symptom  [Pseudoeph-Doxylamine-Dm-Apap] Itching and Other (See Comments)    Skin peels on hands, and feet    Current Outpatient Prescriptions  Medication Sig Dispense Refill  . acyclovir (ZOVIRAX) 400 MG tablet Take 1 tablet (400 mg total) by mouth 2 (two) times daily. Beginning 11/12/2013  60 tablet  4  . albuterol (PROVENTIL HFA;VENTOLIN HFA) 108 (90 BASE) MCG/ACT inhaler Inhale 2 puffs into the lungs every 6 (six) hours as needed for wheezing or shortness of breath.      Marland Kitchen atenolol-chlorthalidone (TENORETIC) 50-25 MG per tablet Take 1 tablet by mouth daily.  30 tablet  1  . dexamethasone (DECADRON) 4 MG tablet Take 2 tablets (8 mg total) by mouth 2 (two) times daily with a meal. Start the day after chemotherapy for 2 days. Take with food.  30 tablet  1  . docusate sodium (COLACE) 100 MG capsule Take 100 mg by mouth daily.      . ferrous sulfate 325 (65 FE) MG tablet Take 325 mg by mouth 2 (two) times daily with a meal.      . gabapentin (  NEURONTIN) 300 MG capsule Take 300 mg by mouth at bedtime.      Marland Kitchen HYDROcodone-ibuprofen (VICOPROFEN) 7.5-200 MG per tablet       . lidocaine-prilocaine (EMLA) cream Apply 1 application topically as needed. Apply over port area 1-2 hours before chemo and cover with plastic wrap  30 g  0  . Loratadine 10 MG CAPS Take 1 capsule (10 mg total) by mouth as directed.  30 each  3  . LORazepam (ATIVAN) 0.5 MG tablet Take 1 tablet (0.5 mg total) by mouth at bedtime as needed (Nausea or vomiting).  30 tablet  0  . metFORMIN (GLUCOPHAGE) 500 MG tablet Take 1 tablet (500 mg total) by mouth 2 (two) times daily with a meal.  60 tablet  1  . Multiple Vitamin (MULTIVITAMIN WITH MINERALS) TABS tablet Take 1 tablet by mouth daily.      . ondansetron (ZOFRAN) 8 MG tablet Take 1 tablet (8 mg total) by mouth 2 (two) times daily. Start the day after chemo for 2 days. Then take as needed for nausea or vomiting.  30 tablet  1  . oxyCODONE (OXY IR/ROXICODONE) 5 MG immediate release tablet Take 1  tablet (5 mg total) by mouth every 4 (four) hours as needed for severe pain.  30 tablet  0  . pantoprazole (PROTONIX) 40 MG tablet Take 1 tablet (40 mg total) by mouth daily.  30 tablet  5  . potassium chloride SA (K-DUR,KLOR-CON) 20 MEQ tablet Take 20 mEq by mouth daily.      . prochlorperazine (COMPAZINE) 10 MG tablet Take 1 tablet (10 mg total) by mouth every 6 (six) hours as needed (Nausea or vomiting).  30 tablet  1  . doxycycline (VIBRA-TABS) 100 MG tablet       . potassium chloride SA (K-DUR,KLOR-CON) 20 MEQ tablet Take 1 tablet (20 mEq total) by mouth daily.  30 tablet  6   No current facility-administered medications for this visit.    OBJECTIVE: Middle-aged [place] American woman in no acute distress Filed Vitals:   02/12/14 1141  BP: 125/93  Pulse: 83  Temp: 98.1 F (36.7 C)  Resp: 20     Body mass index is 31.33 kg/(m^2).    ECOG FS:1 - Symptomatic but completely ambulatory   Sclerae unicteric, pupils equal and reactive Oropharynx clear and moist-- no thrush No cervical or supraclavicular adenopathy Lungs no rales or rhonchi Heart regular rate and rhythm Abd soft, nontender, positive bowel sounds MSK no focal spinal tenderness, no upper extremity lymphedema Neuro: nonfocal, well oriented, appropriate affect Breasts: deferred  LAB RESULTS:   Lab Results  Component Value Date   WBC 5.5 02/12/2014   NEUTROABS 4.3 02/12/2014   HGB 10.4* 02/12/2014   HCT 32.0* 02/12/2014   MCV 100.0 02/12/2014   PLT 290 02/12/2014      Chemistry      Component Value Date/Time   NA 140 02/12/2014 1127   NA 140 10/21/2013 1158   K 4.3 02/12/2014 1127   K 3.9 10/21/2013 1158   CL 105 10/21/2013 1158   CO2 25 02/12/2014 1127   CO2 21 10/21/2013 1158   BUN 15.1 02/12/2014 1127   BUN 16 10/21/2013 1158   CREATININE 0.9 02/12/2014 1127   CREATININE 0.95 10/21/2013 1158      Component Value Date/Time   CALCIUM 9.7 02/12/2014 1127   CALCIUM 9.9 10/21/2013 1158   ALKPHOS 61 02/12/2014 1127    ALKPHOS 81 11/27/2012 0900   AST 23  02/12/2014 1127   AST 22 11/27/2012 0900   ALT 28 02/12/2014 1127   ALT 17 11/27/2012 0900   BILITOT 0.30 02/12/2014 1127   BILITOT 0.1* 11/27/2012 0900      STUDIES: No results found.  ASSESSMENT: 43 y.o. BRCA negative  woman   (1)  status post left breast and left axillary lymph node biopsy 10/03/2013, both positive for a clinical T2 N2, stage IIIA invasive ductal carcinoma, grade 2, estrogen and progesterone receptor positive, HER-2 negative, with an MIB-1 of 20%.  (2) Treated with neoadjuvant chemotherapy to consist of doxorubicin and cyclophosphamide in dose dense fashion x4, completed 12/11/2013, followed by paclitaxel weekly x12 (dose reduced by 20% at cycle 5 because of neuropathy concerns)  (2) definitive surgery to follow chemotherapy  (3) radiation to follow surgery  (4) tamoxifen to be started at the end of radiation  (5) continuing tobacco abuse: The patient has been strongly advised to discontinue smoking  (6) genetic testing sent 10/16/2013 did not reveal any clearly pathogenic (harmful) mutation in any of these genes. The genes tested were ATM, BARD1, BRCA1, BRCA2, BRIP1, CDH1, CHEK2, MRE11A, MUTYH, NBN, NF1, PALB2, PTEN, RAD50, RAD51C, RAD51D, and TP53.  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 The CBC was reviewed in detail with her and was stable. The CMET was not available to review at this time. We will proceed with day 1, cycle 8 today with the dose reduction.   As mentioned beforehand, the patient will not return for cycle 9 next week as she will be out of town for the birth of her first grandchild who will be induced tomorrow. She will start back with cycle 9 on 9/24 and will accordingly be done with treatments on October 15. Her definitive surgery will occur in early November. Ellinor understands and agrees with this plan.  She knows the goal of treatment in her case is cure. She has been encouraged to call with any issues that might arise before her next visit here.   Marcelino Duster, NP 02/12/2014 12:06 PM

## 2014-02-12 NOTE — Patient Instructions (Signed)
[facility] Discharge Instructions for Patients Receiving Chemotherapy  Today you received the following chemotherapy agents: Taxol  To help prevent nausea and vomiting after your treatment, we encourage you to take your nausea medication as prescribed by your physician.  If you develop nausea and vomiting that is not controlled by your nausea medication, call the clinic.   BELOW ARE SYMPTOMS THAT SHOULD BE REPORTED IMMEDIATELY:  *FEVER GREATER THAN 100.5 F  *CHILLS WITH OR WITHOUT FEVER  NAUSEA AND VOMITING THAT IS NOT CONTROLLED WITH YOUR NAUSEA MEDICATION  *UNUSUAL SHORTNESS OF BREATH  *UNUSUAL BRUISING OR BLEEDING  TENDERNESS IN MOUTH AND THROAT WITH OR WITHOUT PRESENCE OF ULCERS  *URINARY PROBLEMS  *BOWEL PROBLEMS  UNUSUAL RASH Items with * indicate a potential emergency and should be followed up as soon as possible.  Feel free to call the clinic you have any questions or concerns. The clinic phone number is [phone].    

## 2014-02-13 ENCOUNTER — Telehealth: Payer: Self-pay | Admitting: *Deleted

## 2014-02-13 NOTE — Telephone Encounter (Signed)
Per staff message and POF I have scheduled appts. Advised scheduler of appts. JMW  

## 2014-02-19 ENCOUNTER — Ambulatory Visit: Payer: No Typology Code available for payment source | Admitting: Nurse Practitioner

## 2014-02-19 ENCOUNTER — Ambulatory Visit: Payer: No Typology Code available for payment source

## 2014-02-19 ENCOUNTER — Other Ambulatory Visit: Payer: No Typology Code available for payment source

## 2014-02-26 ENCOUNTER — Ambulatory Visit (HOSPITAL_BASED_OUTPATIENT_CLINIC_OR_DEPARTMENT_OTHER): Payer: No Typology Code available for payment source | Admitting: Nurse Practitioner

## 2014-02-26 ENCOUNTER — Ambulatory Visit (HOSPITAL_BASED_OUTPATIENT_CLINIC_OR_DEPARTMENT_OTHER): Payer: No Typology Code available for payment source

## 2014-02-26 ENCOUNTER — Other Ambulatory Visit (HOSPITAL_BASED_OUTPATIENT_CLINIC_OR_DEPARTMENT_OTHER): Payer: No Typology Code available for payment source

## 2014-02-26 ENCOUNTER — Other Ambulatory Visit: Payer: Self-pay | Admitting: Oncology

## 2014-02-26 ENCOUNTER — Other Ambulatory Visit: Payer: No Typology Code available for payment source

## 2014-02-26 ENCOUNTER — Encounter: Payer: Self-pay | Admitting: Nurse Practitioner

## 2014-02-26 ENCOUNTER — Telehealth: Payer: Self-pay | Admitting: Nurse Practitioner

## 2014-02-26 VITALS — BP 125/59 | HR 92 | Temp 98.2°F | Resp 18 | Ht 64.0 in | Wt 185.0 lb

## 2014-02-26 VITALS — BP 133/70 | HR 62 | Temp 98.0°F | Resp 19

## 2014-02-26 DIAGNOSIS — N951 Menopausal and female climacteric states: Secondary | ICD-10-CM

## 2014-02-26 DIAGNOSIS — C50412 Malignant neoplasm of upper-outer quadrant of left female breast: Secondary | ICD-10-CM

## 2014-02-26 DIAGNOSIS — D497 Neoplasm of unspecified behavior of endocrine glands and other parts of nervous system: Secondary | ICD-10-CM

## 2014-02-26 DIAGNOSIS — C773 Secondary and unspecified malignant neoplasm of axilla and upper limb lymph nodes: Secondary | ICD-10-CM

## 2014-02-26 DIAGNOSIS — C50419 Malignant neoplasm of upper-outer quadrant of unspecified female breast: Secondary | ICD-10-CM

## 2014-02-26 DIAGNOSIS — G620 Drug-induced polyneuropathy: Secondary | ICD-10-CM

## 2014-02-26 DIAGNOSIS — R232 Flushing: Secondary | ICD-10-CM

## 2014-02-26 DIAGNOSIS — C50919 Malignant neoplasm of unspecified site of unspecified female breast: Secondary | ICD-10-CM

## 2014-02-26 DIAGNOSIS — Z170 Estrogen receptor positive status [ER+]: Secondary | ICD-10-CM

## 2014-02-26 DIAGNOSIS — T451X5A Adverse effect of antineoplastic and immunosuppressive drugs, initial encounter: Secondary | ICD-10-CM

## 2014-02-26 DIAGNOSIS — F17200 Nicotine dependence, unspecified, uncomplicated: Secondary | ICD-10-CM

## 2014-02-26 DIAGNOSIS — Z5111 Encounter for antineoplastic chemotherapy: Secondary | ICD-10-CM

## 2014-02-26 LAB — CBC WITH DIFFERENTIAL/PLATELET
BASO%: 0.3 % (ref 0.0–2.0)
Basophils Absolute: 0 10*3/uL (ref 0.0–0.1)
EOS%: 2.1 % (ref 0.0–7.0)
Eosinophils Absolute: 0.1 10*3/uL (ref 0.0–0.5)
HEMATOCRIT: 33.5 % — AB (ref 34.8–46.6)
HGB: 11 g/dL — ABNORMAL LOW (ref 11.6–15.9)
LYMPH#: 0.6 10*3/uL — AB (ref 0.9–3.3)
LYMPH%: 14.1 % (ref 14.0–49.7)
MCH: 32.6 pg (ref 25.1–34.0)
MCHC: 32.9 g/dL (ref 31.5–36.0)
MCV: 99 fL (ref 79.5–101.0)
MONO#: 0.5 10*3/uL (ref 0.1–0.9)
MONO%: 10.9 % (ref 0.0–14.0)
NEUT#: 3.1 10*3/uL (ref 1.5–6.5)
NEUT%: 72.6 % (ref 38.4–76.8)
Platelets: 302 10*3/uL (ref 145–400)
RBC: 3.38 10*6/uL — AB (ref 3.70–5.45)
RDW: 14.8 % — ABNORMAL HIGH (ref 11.2–14.5)
WBC: 4.3 10*3/uL (ref 3.9–10.3)

## 2014-02-26 LAB — COMPREHENSIVE METABOLIC PANEL (CC13)
ALBUMIN: 3.6 g/dL (ref 3.5–5.0)
ALT: 24 U/L (ref 0–55)
AST: 20 U/L (ref 5–34)
Alkaline Phosphatase: 68 U/L (ref 40–150)
Anion Gap: 9 mEq/L (ref 3–11)
BUN: 14.3 mg/dL (ref 7.0–26.0)
CALCIUM: 10.1 mg/dL (ref 8.4–10.4)
CHLORIDE: 103 meq/L (ref 98–109)
CO2: 26 mEq/L (ref 22–29)
Creatinine: 1 mg/dL (ref 0.6–1.1)
Glucose: 172 mg/dl — ABNORMAL HIGH (ref 70–140)
Potassium: 4.5 mEq/L (ref 3.5–5.1)
Sodium: 138 mEq/L (ref 136–145)
Total Bilirubin: 0.22 mg/dL (ref 0.20–1.20)
Total Protein: 7.8 g/dL (ref 6.4–8.3)

## 2014-02-26 MED ORDER — DEXAMETHASONE SODIUM PHOSPHATE 10 MG/ML IJ SOLN
INTRAMUSCULAR | Status: AC
  Filled 2014-02-26: qty 1

## 2014-02-26 MED ORDER — FAMOTIDINE IN NACL 20-0.9 MG/50ML-% IV SOLN
20.0000 mg | Freq: Once | INTRAVENOUS | Status: AC
  Administered 2014-02-26: 20 mg via INTRAVENOUS

## 2014-02-26 MED ORDER — DIPHENHYDRAMINE HCL 50 MG/ML IJ SOLN
INTRAMUSCULAR | Status: AC
  Filled 2014-02-26: qty 1

## 2014-02-26 MED ORDER — DIPHENHYDRAMINE HCL 50 MG/ML IJ SOLN
25.0000 mg | Freq: Once | INTRAMUSCULAR | Status: AC
  Administered 2014-02-26: 25 mg via INTRAVENOUS

## 2014-02-26 MED ORDER — DEXAMETHASONE SODIUM PHOSPHATE 10 MG/ML IJ SOLN
4.0000 mg | Freq: Once | INTRAMUSCULAR | Status: AC
  Administered 2014-02-26: 4 mg via INTRAVENOUS

## 2014-02-26 MED ORDER — SODIUM CHLORIDE 0.9 % IJ SOLN
10.0000 mL | INTRAMUSCULAR | Status: DC | PRN
  Administered 2014-02-26: 10 mL
  Filled 2014-02-26: qty 10

## 2014-02-26 MED ORDER — FAMOTIDINE IN NACL 20-0.9 MG/50ML-% IV SOLN
INTRAVENOUS | Status: AC
  Filled 2014-02-26: qty 50

## 2014-02-26 MED ORDER — ONDANSETRON 8 MG/NS 50 ML IVPB
INTRAVENOUS | Status: AC
  Filled 2014-02-26: qty 8

## 2014-02-26 MED ORDER — SODIUM CHLORIDE 0.9 % IV SOLN
Freq: Once | INTRAVENOUS | Status: AC
  Administered 2014-02-26: 12:00:00 via INTRAVENOUS

## 2014-02-26 MED ORDER — ONDANSETRON 8 MG/50ML IVPB (CHCC)
8.0000 mg | Freq: Once | INTRAVENOUS | Status: AC
  Administered 2014-02-26: 8 mg via INTRAVENOUS

## 2014-02-26 MED ORDER — PACLITAXEL CHEMO INJECTION 300 MG/50ML
65.0000 mg/m2 | Freq: Once | INTRAVENOUS | Status: AC
  Administered 2014-02-26: 126 mg via INTRAVENOUS
  Filled 2014-02-26: qty 21

## 2014-02-26 MED ORDER — HEPARIN SOD (PORK) LOCK FLUSH 100 UNIT/ML IV SOLN
500.0000 [IU] | Freq: Once | INTRAVENOUS | Status: AC | PRN
  Administered 2014-02-26: 500 [IU]
  Filled 2014-02-26: qty 5

## 2014-02-26 NOTE — Progress Notes (Signed)
[place]  Telephone:([phone]) [phone] [phone]) [phone]    ID: Shelly Hall OB: 06/28/1970  MR#: [id number]  VZD#:[id number]  PCP: No PCP Per Patient GYN:  [name] SU: [name] OTHER MD: Thea Silversmith  CHIEF COMPLAINT:  Left Breast Cancer CURRENT TREATMENT: Neoadjuvant Chemotherapy  BREAST CANCER HISTORY: Shelly Hall palpated a mass in her left breast mid-April 2015 and immediately brought it to Dr. Benjie Karvonen' attention. She was set up for bilateral screening mammography with tomography at [facility] 09/19/2013. The possible distortion in the left breast and a nearby group of calcifications was felt to warrant further evaluation, and on 09/30/2013 she underwent unilateral left diagnostic mammography and ultrasonography. There was an irregular mass in the outer left breast associated with microcalcifications. 4 cm anterior to this there was another cluster of pleomorphic calcifications spanning 7 mm. He had more anteriorly there was an irregular mass associated with other calcifications. In total the abnormalities in the left breast spent approximately 10 cm, and is segmental distribution. On exam there was a palpable firm mass at the 2:30 o'clock position of the left breast 10 cm from the nipple. There was palpable adenopathy in the inferior left axilla. Ultrasound confirmed an irregular hypoechoic mass in the left breast measuring 4.6 cm. In addition, a 1.4 cm oval slightly irregular mass was noted at the 3:00 position and yet another mass measured 9 mm in the same area. Ultrasound of the left axilla showed a dominant 3.6 cm lymph node.  Biopsy of 2 of the left breast masses and the suspicious left breast lymph node on 10/03/2013 showed ([id number]) an invasive ductal carcinoma, grade 2, estrogen receptor 90% positive with strong staining intensity, progesterone receptor 40% positive but moderate staining intensity, with an MIB-1 of 20% and no HER-2 amplification.  (Because all 3 biopsies were morphologically identical, only one prognostic panel was sent).  On 10/10/2013 the patient underwent bilateral breast MRI. This showed numerous enhancing masses in the left breast, the largest measuring 3.5 cm, and the aggregate measuring 12.7 cm. There were numerous enlarged left axillary lymph nodes, at least 5 of which were enlarged, both at levels 1 and 2. The largest measured 2.3 cm. The right breast was negative.  The patient's subsequent history is as detailed below.  INTERVAL HISTORY: Shelly Hall returns today for follow up of her breast cancer. Today is day 1, cycle 9 of 12 planned weekly cycles of paclitaxel. The paclitaxel dose was decrease by 20% with cycle 5 after peripheral neuropathy concerns. Last week's dose was held because she was out of town for the birth of her first grandchild, Shelly Hall! The c-section went well, and both mother and baby are healthy.   REVIEW OF SYSTEMS: Shelly Hall denies pain, fever, chills, nausea, or vomiting. She had some constipation over the weekend but this was relieved with some milk of magnesia. Her appetite is still strong, but she experiences taste perversion for 3-4 days after chemo. She has some shortness of breath requiring the use of her inhaler. But she denies chest pains, palpitations, or fatigue. Her vision is mildly blurred, but she will have her eyes checked after treatment. She continues to experience numbness to her bilateral great toes, but this is unchanged. Her hot flashes are controlled with $RemoveBeforeD'300mg'MSrMWiGAsmTzBV$  gabapentin QHS. A detailed review of systems was otherwise noncontributory.   PAST MEDICAL HISTORY: Past Medical History  Diagnosis Date  . Diabetes mellitus   . Hypertension   . Sarcoidosis   . Anxiety   .  Depression   . Anemia   . Complication of anesthesia     makes pt. restless and unable to be still  . Cancer     breast cancer    PAST SURGICAL HISTORY: Past Surgical History  Procedure Laterality Date   . Tubal ligation  2001  . Endobronchial ultrasound Bilateral 12/02/2012    Procedure: ENDOBRONCHIAL ULTRASOUND;  Surgeon: Collene Gobble, MD;  Location: [facility] ENDOSCOPY;  Service: Cardiopulmonary;  Laterality: Bilateral;  . Breast surgery Left     breast biopsy times 3  . Portacath placement N/A 10/22/2013    Procedure: INSERTION PORT-A-CATH;  Surgeon: [name], MD;  Location: [facility] OR;  Service: General;  Laterality: N/A;    FAMILY HISTORY Family History  Problem Relation Age of Onset  . Cancer Paternal Aunt     "bone cancer"; deceased 65s  . Cancer Paternal Uncle     stomach cancer; deceased 69s  . Cancer Paternal Aunt     unk. primary; currently 71s  . Prostate cancer Maternal Grandfather 54  The patient's parents are living, in fair mid to late 56s. The patient had one brother, no sisters. There is no history of breast or ovarian cancer in the family to her knowledge.   GYNECOLOGIC HISTORY:   (Reviewed 11/27/2013)  menarche age 70, first live birth at 61. She is GX P2. She was still having regular periods at the time of the start of her chemotherapy in May 2015.  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 She is single and lives alone, although her mother is currently staying with her. Her son Shelly Hall lives in [place] and works at [employer]. Son Shelly Hall lives in [place] where he works at a [employer]. The patient has 1 grandchild on the way. 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 Will be taking a break to go and visit her son who lives in [place], [place]: not in place   HEALTH MAINTENANCE: (Updated 11/27/2013) History  Substance Use Topics  . Smoking status: Former Smoker -- 1.00 packs/day for 23 years    Types: Cigarettes    Quit date: 10/19/2013  . Smokeless tobacco: Never Used  . Alcohol Use: 1.0 oz/week    2 drink(s) per week     Comment: occ. x2 dks  per week     Colonoscopy: Never  PAP: Not on file  Bone density: Not on file  Lipid panel:  Not on file  Allergies  Allergen Reactions  . Nyquil Multi-Symptom [Pseudoeph-Doxylamine-Dm-Apap] Itching and Other (See Comments)    Skin peels on hands, and feet    Current Outpatient Prescriptions  Medication Sig Dispense Refill  . acyclovir (ZOVIRAX) 400 MG tablet Take 1 tablet (400 mg total) by mouth 2 (two) times daily. Beginning 11/12/2013  60 tablet  4  . albuterol (PROVENTIL HFA;VENTOLIN HFA) 108 (90 BASE) MCG/ACT inhaler Inhale 2 puffs into the lungs every 6 (six) hours as needed for wheezing or shortness of breath.      Marland Kitchen atenolol-chlorthalidone (TENORETIC) 50-25 MG per tablet Take 1 tablet by mouth daily.  30 tablet  1  . dexamethasone (DECADRON) 4 MG tablet Take 2 tablets (8 mg total) by mouth 2 (two) times daily with a meal. Start the day after chemotherapy for 2 days. Take with food.  30 tablet  1  . docusate sodium (COLACE) 100 MG capsule Take 100 mg by mouth daily.      Marland Kitchen  doxycycline (VIBRA-TABS) 100 MG tablet       . ferrous sulfate 325 (65 FE) MG tablet Take 325 mg by mouth 2 (two) times daily with a meal.      . gabapentin (NEURONTIN) 300 MG capsule Take 300 mg by mouth at bedtime.      Marland Kitchen HYDROcodone-ibuprofen (VICOPROFEN) 7.5-200 MG per tablet       . lidocaine-prilocaine (EMLA) cream Apply 1 application topically as needed. Apply over port area 1-2 hours before chemo and cover with plastic wrap  30 g  0  . LORazepam (ATIVAN) 0.5 MG tablet Take 1 tablet (0.5 mg total) by mouth at bedtime as needed (Nausea or vomiting).  30 tablet  0  . metFORMIN (GLUCOPHAGE) 500 MG tablet Take 1 tablet (500 mg total) by mouth 2 (two) times daily with a meal.  60 tablet  1  . Multiple Vitamin (MULTIVITAMIN WITH MINERALS) TABS tablet Take 1 tablet by mouth daily.      . ondansetron (ZOFRAN) 8 MG tablet Take 1 tablet (8 mg total) by mouth 2 (two) times daily. Start the day after chemo for 2 days.  Then take as needed for nausea or vomiting.  30 tablet  1  . oxyCODONE (OXY IR/ROXICODONE) 5 MG immediate release tablet Take 1 tablet (5 mg total) by mouth every 4 (four) hours as needed for severe pain.  30 tablet  0  . pantoprazole (PROTONIX) 40 MG tablet Take 1 tablet (40 mg total) by mouth daily.  30 tablet  5  . potassium chloride SA (K-DUR,KLOR-CON) 20 MEQ tablet Take 20 mEq by mouth daily.      . prochlorperazine (COMPAZINE) 10 MG tablet Take 1 tablet (10 mg total) by mouth every 6 (six) hours as needed (Nausea or vomiting).  30 tablet  1  . Loratadine 10 MG CAPS Take 1 capsule (10 mg total) by mouth as directed.  30 each  3  . potassium chloride SA (K-DUR,KLOR-CON) 20 MEQ tablet Take 1 tablet (20 mEq total) by mouth daily.  30 tablet  6   No current facility-administered medications for this visit.    OBJECTIVE: Middle-aged [place] American woman in no acute distress Filed Vitals:   02/26/14 1044  BP: 125/59  Pulse: 92  Temp: 98.2 F (36.8 C)  Resp: 18     Body mass index is 31.74 kg/(m^2).    ECOG FS:1 - Symptomatic but completely ambulatory   Skin: warm, dry, hyperpigmented nail beds and palms, improving  HEENT: sclerae anicteric, conjunctivae pink, oropharynx clear. No thrush or mucositis.  Lymph Nodes: No cervical or supraclavicular lymphadenopathy  Lungs: clear to auscultation bilaterally, no rales, wheezes, or rhonci  Heart: regular rate and rhythm  Abdomen: round, soft, non tender, positive bowel sounds  Musculoskeletal: No focal spinal tenderness, no peripheral edema  Neuro: non focal, well oriented, positive affect  Breasts: deferred  LAB RESULTS:   Lab Results  Component Value Date   WBC 4.3 02/26/2014   NEUTROABS 3.1 02/26/2014   HGB 11.0* 02/26/2014   HCT 33.5* 02/26/2014   MCV 99.0 02/26/2014   PLT 302 02/26/2014      Chemistry      Component Value Date/Time   NA 140 02/12/2014 1127   NA 140 10/21/2013 1158   K 4.3 02/12/2014 1127   K 3.9 10/21/2013 1158    CL 105 10/21/2013 1158   CO2 25 02/12/2014 1127   CO2 21 10/21/2013 1158   BUN 15.1 02/12/2014 1127   BUN  16 10/21/2013 1158   CREATININE 0.9 02/12/2014 1127   CREATININE 0.95 10/21/2013 1158      Component Value Date/Time   CALCIUM 9.7 02/12/2014 1127   CALCIUM 9.9 10/21/2013 1158   ALKPHOS 61 02/12/2014 1127   ALKPHOS 81 11/27/2012 0900   AST 23 02/12/2014 1127   AST 22 11/27/2012 0900   ALT 28 02/12/2014 1127   ALT 17 11/27/2012 0900   BILITOT 0.30 02/12/2014 1127   BILITOT 0.1* 11/27/2012 0900      STUDIES: No results found.  ASSESSMENT: 43 y.o. BRCA negative [place] woman   (1)  status post left breast and left axillary lymph node biopsy 10/03/2013, both positive for a clinical T2 N2, stage IIIA invasive ductal carcinoma, grade 2, estrogen and progesterone receptor positive, HER-2 negative, with an MIB-1 of 20%.  (2) Treated with neoadjuvant chemotherapy to consist of doxorubicin and cyclophosphamide in dose dense fashion x4, completed 12/11/2013, followed by paclitaxel weekly x12 (dose reduced by 20% at cycle 5 because of neuropathy concerns)  (2) definitive surgery to follow chemotherapy  (3) radiation to follow surgery  (4) tamoxifen to be started at the end of radiation  (5) continuing tobacco abuse: The patient has been strongly advised to discontinue smoking  (6) genetic testing sent 10/16/2013 did not reveal any clearly pathogenic (harmful) mutation in any of these genes. The genes tested were ATM, BARD1, BRCA1, BRCA2, BRIP1, CDH1, CHEK2, MRE11A, MUTYH, NBN, NF1, PALB2, PTEN, RAD50, RAD51C, RAD51D, and TP53.  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 The labs were reviewed in detail and were completely stable. We will proceed with day 1, cycle 9 of taxol today with the dose reduction.   Shelly Hall will return next week for cycle 10. She should finish with Taxol around mid October and will have a  breast MRI at that time. She already has a surgical appointment with Dr. Barry Dienes on 10/26, and expecting for her lumpectomy in early November. Shelly Hall understands and agrees with this plan. She knows the goal of treatment in her case is cure. She has been encouraged to call with any issues that might arise before her next visit here.   Marcelino Duster, NP 02/26/2014 10:56 AM

## 2014-02-26 NOTE — Patient Instructions (Signed)
[facility] Discharge Instructions for Patients Receiving Chemotherapy  Today you received the following chemotherapy agents: Taxol  To help prevent nausea and vomiting after your treatment, we encourage you to take your nausea medication as prescribed by your physician.  If you develop nausea and vomiting that is not controlled by your nausea medication, call the clinic.   BELOW ARE SYMPTOMS THAT SHOULD BE REPORTED IMMEDIATELY:  *FEVER GREATER THAN 100.5 F  *CHILLS WITH OR WITHOUT FEVER  NAUSEA AND VOMITING THAT IS NOT CONTROLLED WITH YOUR NAUSEA MEDICATION  *UNUSUAL SHORTNESS OF BREATH  *UNUSUAL BRUISING OR BLEEDING  TENDERNESS IN MOUTH AND THROAT WITH OR WITHOUT PRESENCE OF ULCERS  *URINARY PROBLEMS  *BOWEL PROBLEMS  UNUSUAL RASH Items with * indicate a potential emergency and should be followed up as soon as possible.  Feel free to call the clinic you have any questions or concerns. The clinic phone number is [phone].    

## 2014-02-26 NOTE — Telephone Encounter (Signed)
per pof to sch pt appt-adv pt CS will call to sch MRI-gave pt copy of sch

## 2014-03-05 ENCOUNTER — Other Ambulatory Visit (HOSPITAL_BASED_OUTPATIENT_CLINIC_OR_DEPARTMENT_OTHER): Payer: No Typology Code available for payment source

## 2014-03-05 ENCOUNTER — Ambulatory Visit (HOSPITAL_BASED_OUTPATIENT_CLINIC_OR_DEPARTMENT_OTHER): Payer: No Typology Code available for payment source | Admitting: Nurse Practitioner

## 2014-03-05 ENCOUNTER — Ambulatory Visit (HOSPITAL_BASED_OUTPATIENT_CLINIC_OR_DEPARTMENT_OTHER): Payer: No Typology Code available for payment source

## 2014-03-05 ENCOUNTER — Other Ambulatory Visit: Payer: Self-pay | Admitting: Oncology

## 2014-03-05 ENCOUNTER — Other Ambulatory Visit: Payer: No Typology Code available for payment source

## 2014-03-05 ENCOUNTER — Encounter: Payer: Self-pay | Admitting: Nurse Practitioner

## 2014-03-05 VITALS — BP 123/84 | HR 97 | Temp 98.7°F | Resp 18 | Ht 64.0 in | Wt 183.9 lb

## 2014-03-05 DIAGNOSIS — C50812 Malignant neoplasm of overlapping sites of left female breast: Secondary | ICD-10-CM

## 2014-03-05 DIAGNOSIS — D497 Neoplasm of unspecified behavior of endocrine glands and other parts of nervous system: Secondary | ICD-10-CM

## 2014-03-05 DIAGNOSIS — C50412 Malignant neoplasm of upper-outer quadrant of left female breast: Secondary | ICD-10-CM

## 2014-03-05 DIAGNOSIS — Z720 Tobacco use: Secondary | ICD-10-CM

## 2014-03-05 DIAGNOSIS — C773 Secondary and unspecified malignant neoplasm of axilla and upper limb lymph nodes: Secondary | ICD-10-CM

## 2014-03-05 DIAGNOSIS — Z170 Estrogen receptor positive status [ER+]: Secondary | ICD-10-CM

## 2014-03-05 DIAGNOSIS — Z5111 Encounter for antineoplastic chemotherapy: Secondary | ICD-10-CM

## 2014-03-05 LAB — COMPREHENSIVE METABOLIC PANEL (CC13)
ALK PHOS: 77 U/L (ref 40–150)
ALT: 28 U/L (ref 0–55)
ANION GAP: 9 meq/L (ref 3–11)
AST: 22 U/L (ref 5–34)
Albumin: 3.8 g/dL (ref 3.5–5.0)
BUN: 11.5 mg/dL (ref 7.0–26.0)
CALCIUM: 10.2 mg/dL (ref 8.4–10.4)
CHLORIDE: 101 meq/L (ref 98–109)
CO2: 27 meq/L (ref 22–29)
Creatinine: 1 mg/dL (ref 0.6–1.1)
GLUCOSE: 159 mg/dL — AB (ref 70–140)
Potassium: 4.3 mEq/L (ref 3.5–5.1)
Sodium: 137 mEq/L (ref 136–145)
TOTAL PROTEIN: 8.1 g/dL (ref 6.4–8.3)
Total Bilirubin: 0.25 mg/dL (ref 0.20–1.20)

## 2014-03-05 LAB — CBC WITH DIFFERENTIAL/PLATELET
BASO%: 0.4 % (ref 0.0–2.0)
BASOS ABS: 0 10*3/uL (ref 0.0–0.1)
EOS%: 2.5 % (ref 0.0–7.0)
Eosinophils Absolute: 0.1 10*3/uL (ref 0.0–0.5)
HCT: 35 % (ref 34.8–46.6)
HEMOGLOBIN: 11.7 g/dL (ref 11.6–15.9)
LYMPH%: 15.4 % (ref 14.0–49.7)
MCH: 32.1 pg (ref 25.1–34.0)
MCHC: 33.4 g/dL (ref 31.5–36.0)
MCV: 96.2 fL (ref 79.5–101.0)
MONO#: 0.4 10*3/uL (ref 0.1–0.9)
MONO%: 7.2 % (ref 0.0–14.0)
NEUT%: 74.5 % (ref 38.4–76.8)
NEUTROS ABS: 4.2 10*3/uL (ref 1.5–6.5)
Platelets: 316 10*3/uL (ref 145–400)
RBC: 3.64 10*6/uL — ABNORMAL LOW (ref 3.70–5.45)
RDW: 13.9 % (ref 11.2–14.5)
WBC: 5.7 10*3/uL (ref 3.9–10.3)
lymph#: 0.9 10*3/uL (ref 0.9–3.3)

## 2014-03-05 MED ORDER — SODIUM CHLORIDE 0.9 % IV SOLN
Freq: Once | INTRAVENOUS | Status: AC
  Administered 2014-03-05: 12:00:00 via INTRAVENOUS

## 2014-03-05 MED ORDER — DIPHENHYDRAMINE HCL 50 MG/ML IJ SOLN
INTRAMUSCULAR | Status: AC
  Filled 2014-03-05: qty 1

## 2014-03-05 MED ORDER — HEPARIN SOD (PORK) LOCK FLUSH 100 UNIT/ML IV SOLN
500.0000 [IU] | Freq: Once | INTRAVENOUS | Status: AC | PRN
  Administered 2014-03-05: 500 [IU]
  Filled 2014-03-05: qty 5

## 2014-03-05 MED ORDER — PACLITAXEL CHEMO INJECTION 300 MG/50ML
65.0000 mg/m2 | Freq: Once | INTRAVENOUS | Status: AC
  Administered 2014-03-05: 126 mg via INTRAVENOUS
  Filled 2014-03-05: qty 21

## 2014-03-05 MED ORDER — DEXAMETHASONE SODIUM PHOSPHATE 10 MG/ML IJ SOLN
INTRAMUSCULAR | Status: AC
  Filled 2014-03-05: qty 1

## 2014-03-05 MED ORDER — ONDANSETRON 8 MG/NS 50 ML IVPB
INTRAVENOUS | Status: AC
  Filled 2014-03-05: qty 8

## 2014-03-05 MED ORDER — FAMOTIDINE IN NACL 20-0.9 MG/50ML-% IV SOLN
INTRAVENOUS | Status: AC
  Filled 2014-03-05: qty 50

## 2014-03-05 MED ORDER — FAMOTIDINE IN NACL 20-0.9 MG/50ML-% IV SOLN
20.0000 mg | Freq: Once | INTRAVENOUS | Status: AC
  Administered 2014-03-05: 20 mg via INTRAVENOUS

## 2014-03-05 MED ORDER — SODIUM CHLORIDE 0.9 % IJ SOLN
10.0000 mL | INTRAMUSCULAR | Status: DC | PRN
  Administered 2014-03-05: 10 mL
  Filled 2014-03-05: qty 10

## 2014-03-05 MED ORDER — DEXAMETHASONE SODIUM PHOSPHATE 10 MG/ML IJ SOLN
4.0000 mg | Freq: Once | INTRAMUSCULAR | Status: AC
  Administered 2014-03-05: 4 mg via INTRAVENOUS

## 2014-03-05 MED ORDER — DIPHENHYDRAMINE HCL 50 MG/ML IJ SOLN
25.0000 mg | Freq: Once | INTRAMUSCULAR | Status: AC
  Administered 2014-03-05: 25 mg via INTRAVENOUS

## 2014-03-05 MED ORDER — LORAZEPAM 0.5 MG PO TABS: 0.5000 mg | ORAL_TABLET | Freq: Every evening | ORAL | Status: DC | PRN

## 2014-03-05 MED ORDER — PROCHLORPERAZINE MALEATE 10 MG PO TABS: 10.0000 mg | ORAL_TABLET | Freq: Four times a day (QID) | ORAL | Status: DC | PRN

## 2014-03-05 MED ORDER — ONDANSETRON 8 MG/50ML IVPB (CHCC)
8.0000 mg | Freq: Once | INTRAVENOUS | Status: AC
  Administered 2014-03-05: 8 mg via INTRAVENOUS

## 2014-03-05 NOTE — Patient Instructions (Signed)
[facility] Discharge Instructions for Patients Receiving Chemotherapy  Today you received the following chemotherapy agents Taxol  To help prevent nausea and vomiting after your treatment, we encourage you to take your nausea medication    If you develop nausea and vomiting that is not controlled by your nausea medication, call the clinic.   BELOW ARE SYMPTOMS THAT SHOULD BE REPORTED IMMEDIATELY:  *FEVER GREATER THAN 100.5 F  *CHILLS WITH OR WITHOUT FEVER  NAUSEA AND VOMITING THAT IS NOT CONTROLLED WITH YOUR NAUSEA MEDICATION  *UNUSUAL SHORTNESS OF BREATH  *UNUSUAL BRUISING OR BLEEDING  TENDERNESS IN MOUTH AND THROAT WITH OR WITHOUT PRESENCE OF ULCERS  *URINARY PROBLEMS  *BOWEL PROBLEMS  UNUSUAL RASH Items with * indicate a potential emergency and should be followed up as soon as possible.  Feel free to call the clinic you have any questions or concerns. The clinic phone number is [phone].    

## 2014-03-05 NOTE — Progress Notes (Addendum)
[place]  Telephone:([phone]) [phone] Fax:(336) [phone]    ID: Shelly Hall OB: [birth year]  MR#: [id number]  XNA#:[id number]  PCP: No PCP Per Patient GYN:  [name] SU: [name] OTHER MD: Thea Silversmith  CHIEF COMPLAINT:  Left Breast Cancer CURRENT TREATMENT: Neoadjuvant Chemotherapy  BREAST CANCER HISTORY: Shelly Hall palpated a mass in her left breast mid-April 2015 and immediately brought it to Dr. Benjie Karvonen' attention. She was set up for bilateral screening mammography with tomography at [facility] 09/19/2013. The possible distortion in the left breast and a nearby group of calcifications was felt to warrant further evaluation, and on 09/30/2013 she underwent unilateral left diagnostic mammography and ultrasonography. There was an irregular mass in the outer left breast associated with microcalcifications. 4 cm anterior to this there was another cluster of pleomorphic calcifications spanning 7 mm. He had more anteriorly there was an irregular mass associated with other calcifications. In total the abnormalities in the left breast spent approximately 10 cm, and is segmental distribution. On exam there was a palpable firm mass at the 2:30 o'clock position of the left breast 10 cm from the nipple. There was palpable adenopathy in the inferior left axilla. Ultrasound confirmed an irregular hypoechoic mass in the left breast measuring 4.6 cm. In addition, a 1.4 cm oval slightly irregular mass was noted at the 3:00 position and yet another mass measured 9 mm in the same area. Ultrasound of the left axilla showed a dominant 3.6 cm lymph node.  Biopsy of 2 of the left breast masses and the suspicious left breast lymph node on 10/03/2013 showed ([id number]) an invasive ductal carcinoma, grade 2, estrogen receptor 90% positive with strong staining intensity, progesterone receptor 40% positive but moderate staining intensity, with an MIB-1 of 20% and no HER-2 amplification.  (Because all 3 biopsies were morphologically identical, only one prognostic panel was sent).  On 10/10/2013 the patient underwent bilateral breast MRI. This showed numerous enhancing masses in the left breast, the largest measuring 3.5 cm, and the aggregate measuring 12.7 cm. There were numerous enlarged left axillary lymph nodes, at least 5 of which were enlarged, both at levels 1 and 2. The largest measured 2.3 cm. The right breast was negative.  The patient's subsequent history is as detailed below.  INTERVAL HISTORY: Shelly Hall returns today for follow up of her breast cancer. Today is day 1, cycle 10 of 12 planned weekly cycles of paclitaxel. She is doing well today. She admits she is starting to get anxious about her upcoming surgery. She is out of ativan and needs a refill.  REVIEW OF SYSTEMS: Shelly Hall denies pain, fevers, chills, or changes in bowel or bladder habits. She experiences some light nausea managed with compazine, but no vomiting. Her taste sensation has changed, but her appetite remains intact. The numbness to her bilateral great toes is starting to fade. The hyperpigmentation to her nailbeds and palms is fading as well. She has some shortness of breath requiring the use of an inhaler, but she denies chest pain, palpitations, cough or fatigue. A detailed review of systems is otherwise noncontributory.   PAST MEDICAL HISTORY: Past Medical History  Diagnosis Date  . Diabetes mellitus   . Hypertension   . Sarcoidosis   . Anxiety   . Depression   . Anemia   . Complication of anesthesia     makes pt. restless and unable to be still  . Cancer     breast cancer    PAST SURGICAL HISTORY: Past  Surgical History  Procedure Laterality Date  . Tubal ligation  2001  . Endobronchial ultrasound Bilateral 12/02/2012    Procedure: ENDOBRONCHIAL ULTRASOUND;  Surgeon: Collene Gobble, MD;  Location: [facility] ENDOSCOPY;  Service: Cardiopulmonary;  Laterality: Bilateral;  . Breast surgery Left      breast biopsy times 3  . Portacath placement N/A 10/22/2013    Procedure: INSERTION PORT-A-CATH;  Surgeon: [name], MD;  Location: [facility] OR;  Service: General;  Laterality: N/A;    FAMILY HISTORY Family History  Problem Relation Age of Onset  . Cancer Paternal Aunt     "bone cancer"; deceased 59s  . Cancer Paternal Uncle     stomach cancer; deceased 10s  . Cancer Paternal Aunt     unk. primary; currently 26s  . Prostate cancer Maternal Grandfather 28  The patient's parents are living, in fair mid to late 11s. The patient had one brother, no sisters. There is no history of breast or ovarian cancer in the family to her knowledge.   GYNECOLOGIC HISTORY:   (Reviewed 11/27/2013)  menarche age 19, first live birth at 55. She is GX P2. She was still having regular periods at the time of the start of her chemotherapy in May 2015.  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 She is single and lives alone, although her mother is currently staying with her. Her son Shelly Hall lives in [place] and works at [employer]. Son Shelly Hall lives in [place] where he works at a [employer]. The patient has 1 grandchild on the way. 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 Will be taking a break to go and visit her son who lives in [place], [place]: not in place   HEALTH MAINTENANCE: (Updated 11/27/2013) History  Substance Use Topics  . Smoking status: Former Smoker -- 1.00 packs/day for 23 years    Types: Cigarettes    Quit date: 10/19/2013  . Smokeless tobacco: Never Used  . Alcohol Use: 1.0 oz/week    2 drink(s) per week     Comment: occ. x2 dks per week     Colonoscopy: Never  PAP: Not on file  Bone density: Not on file  Lipid panel:  Not on file  Allergies  Allergen Reactions  . Nyquil Multi-Symptom [Pseudoeph-Doxylamine-Dm-Apap] Itching and Other (See Comments)    Skin peels  on hands, and feet    Current Outpatient Prescriptions  Medication Sig Dispense Refill  . acyclovir (ZOVIRAX) 400 MG tablet Take 1 tablet (400 mg total) by mouth 2 (two) times daily. Beginning 11/12/2013  60 tablet  4  . albuterol (PROVENTIL HFA;VENTOLIN HFA) 108 (90 BASE) MCG/ACT inhaler Inhale 2 puffs into the lungs every 6 (six) hours as needed for wheezing or shortness of breath.      Marland Kitchen atenolol-chlorthalidone (TENORETIC) 50-25 MG per tablet Take 1 tablet by mouth daily.  30 tablet  1  . dexamethasone (DECADRON) 4 MG tablet Take 2 tablets (8 mg total) by mouth 2 (two) times daily with a meal. Start the day after chemotherapy for 2 days. Take with food.  30 tablet  1  . docusate sodium (COLACE) 100 MG capsule Take 100 mg by mouth daily.      Marland Kitchen doxycycline (VIBRA-TABS) 100 MG tablet       . ferrous sulfate 325 (65 FE) MG tablet Take 325 mg by mouth 2 (two) times daily with a meal.      .  gabapentin (NEURONTIN) 300 MG capsule Take 300 mg by mouth at bedtime.      Marland Kitchen HYDROcodone-ibuprofen (VICOPROFEN) 7.5-200 MG per tablet       . lidocaine-prilocaine (EMLA) cream Apply 1 application topically as needed. Apply over port area 1-2 hours before chemo and cover with plastic wrap  30 g  0  . Loratadine 10 MG CAPS Take 1 capsule (10 mg total) by mouth as directed.  30 each  3  . LORazepam (ATIVAN) 0.5 MG tablet Take 1 tablet (0.5 mg total) by mouth at bedtime as needed (Nausea or vomiting).  30 tablet  0  . metFORMIN (GLUCOPHAGE) 500 MG tablet Take 1 tablet (500 mg total) by mouth 2 (two) times daily with a meal.  60 tablet  1  . Multiple Vitamin (MULTIVITAMIN WITH MINERALS) TABS tablet Take 1 tablet by mouth daily.      . ondansetron (ZOFRAN) 8 MG tablet Take 1 tablet (8 mg total) by mouth 2 (two) times daily. Start the day after chemo for 2 days. Then take as needed for nausea or vomiting.  30 tablet  1  . oxyCODONE (OXY IR/ROXICODONE) 5 MG immediate release tablet Take 1 tablet (5 mg total) by mouth  every 4 (four) hours as needed for severe pain.  30 tablet  0  . pantoprazole (PROTONIX) 40 MG tablet Take 1 tablet (40 mg total) by mouth daily.  30 tablet  5  . potassium chloride SA (K-DUR,KLOR-CON) 20 MEQ tablet Take 1 tablet (20 mEq total) by mouth daily.  30 tablet  6  . potassium chloride SA (K-DUR,KLOR-CON) 20 MEQ tablet Take 20 mEq by mouth daily.      . prochlorperazine (COMPAZINE) 10 MG tablet Take 1 tablet (10 mg total) by mouth every 6 (six) hours as needed (Nausea or vomiting).  30 tablet  1   No current facility-administered medications for this visit.    OBJECTIVE: Middle-aged [place] American woman in no acute distress Filed Vitals:   03/05/14 1152  BP: 123/84  Pulse: 97  Temp: 98.7 F (37.1 C)  Resp: 18     Body mass index is 31.55 kg/(m^2).    ECOG FS:1 - Symptomatic but completely ambulatory  Skin warm and dry, hyperpigmentation to bilateral nailbeds and palms resolving Sclerae unicteric, pupils equal and reactive Oropharynx clear and moist-- no thrush No cervical or supraclavicular adenopathy Lungs no rales or rhonchi Heart regular rate and rhythm Abd soft, nontender, positive bowel sounds MSK no focal spinal tenderness, no upper extremity lymphedema Neuro: nonfocal, well oriented, appropriate affect Breasts: deferred  LAB RESULTS:   Lab Results  Component Value Date   WBC 5.7 03/05/2014   NEUTROABS 4.2 03/05/2014   HGB 11.7 03/05/2014   HCT 35.0 03/05/2014   MCV 96.2 03/05/2014   PLT 316 03/05/2014      Chemistry      Component Value Date/Time   NA 138 02/26/2014 1008   NA 140 10/21/2013 1158   K 4.5 02/26/2014 1008   K 3.9 10/21/2013 1158   CL 105 10/21/2013 1158   CO2 26 02/26/2014 1008   CO2 21 10/21/2013 1158   BUN 14.3 02/26/2014 1008   BUN 16 10/21/2013 1158   CREATININE 1.0 02/26/2014 1008   CREATININE 0.95 10/21/2013 1158      Component Value Date/Time   CALCIUM 10.1 02/26/2014 1008   CALCIUM 9.9 10/21/2013 1158   ALKPHOS 68 02/26/2014 1008    ALKPHOS 81 11/27/2012 0900   AST 20 02/26/2014  1008   AST 22 11/27/2012 0900   ALT 24 02/26/2014 1008   ALT 17 11/27/2012 0900   BILITOT 0.22 02/26/2014 1008   BILITOT 0.1* 11/27/2012 0900      STUDIES: No results found.  ASSESSMENT: 43 y.o. BRCA negative [place] woman   (1)  status post left breast and left axillary lymph node biopsy 10/03/2013, both positive for a clinical T2 N2, stage IIIA invasive ductal carcinoma, grade 2, estrogen and progesterone receptor positive, HER-2 negative, with an MIB-1 of 20%.  (2) Treated with neoadjuvant chemotherapy to consist of doxorubicin and cyclophosphamide in dose dense fashion x4, completed 12/11/2013, followed by paclitaxel weekly x12 (dose reduced by 20% at cycle 5 because of neuropathy concerns)  (2) definitive surgery to follow chemotherapy  (3) radiation to follow surgery  (4) tamoxifen to be started at the end of radiation  (5) continuing tobacco abuse: The patient has been strongly advised to discontinue smoking  (6) genetic testing sent 10/16/2013 did not reveal any clearly pathogenic (harmful) mutation in any of these genes. The genes tested were ATM, BARD1, BRCA1, BRCA2, BRIP1, CDH1, CHEK2, MRE11A, MUTYH, NBN, NF1, PALB2, PTEN, RAD50, RAD51C, RAD51D, and TP53.  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 The labs were reviewed in detail and were completely stable. We will proceed with day 1, cycle 10 of taxol today.   Dorean will return next week for cycle 11. She claims to have had a breast MRI late last week at an outside facility, but these results were not available to view in Epic just yet. She will have her meeting with Dr. Barry Dienes on 10/26 and is expecting a lumpectomy in early November. She will meet with Dr. Jana Hakim later that month for follow up. Kaleisha understands and agrees with this plan. She knows the goal of treatment in her case is  cure. She has been encouraged to call with any issues that might arise before her next visit here.   Marcelino Duster, NP 03/05/2014 12:06 PM

## 2014-03-12 ENCOUNTER — Other Ambulatory Visit (HOSPITAL_BASED_OUTPATIENT_CLINIC_OR_DEPARTMENT_OTHER): Payer: No Typology Code available for payment source

## 2014-03-12 ENCOUNTER — Other Ambulatory Visit: Payer: No Typology Code available for payment source

## 2014-03-12 ENCOUNTER — Ambulatory Visit (HOSPITAL_BASED_OUTPATIENT_CLINIC_OR_DEPARTMENT_OTHER): Payer: No Typology Code available for payment source

## 2014-03-12 ENCOUNTER — Encounter: Payer: Self-pay | Admitting: Nurse Practitioner

## 2014-03-12 ENCOUNTER — Ambulatory Visit (HOSPITAL_BASED_OUTPATIENT_CLINIC_OR_DEPARTMENT_OTHER): Payer: Medicaid Other | Admitting: Nurse Practitioner

## 2014-03-12 VITALS — BP 133/94 | HR 77 | Temp 98.4°F | Resp 20 | Ht 64.0 in | Wt 186.4 lb

## 2014-03-12 DIAGNOSIS — C50412 Malignant neoplasm of upper-outer quadrant of left female breast: Secondary | ICD-10-CM

## 2014-03-12 DIAGNOSIS — Z5111 Encounter for antineoplastic chemotherapy: Secondary | ICD-10-CM

## 2014-03-12 LAB — COMPREHENSIVE METABOLIC PANEL (CC13)
ALT: 28 U/L (ref 0–55)
ANION GAP: 9 meq/L (ref 3–11)
AST: 19 U/L (ref 5–34)
Albumin: 3.7 g/dL (ref 3.5–5.0)
Alkaline Phosphatase: 66 U/L (ref 40–150)
BUN: 15.1 mg/dL (ref 7.0–26.0)
CO2: 26 meq/L (ref 22–29)
CREATININE: 0.8 mg/dL (ref 0.6–1.1)
Calcium: 10 mg/dL (ref 8.4–10.4)
Chloride: 103 mEq/L (ref 98–109)
Glucose: 185 mg/dl — ABNORMAL HIGH (ref 70–140)
Potassium: 3.7 mEq/L (ref 3.5–5.1)
Sodium: 137 mEq/L (ref 136–145)
Total Protein: 7.8 g/dL (ref 6.4–8.3)

## 2014-03-12 LAB — CBC WITH DIFFERENTIAL/PLATELET
BASO%: 0.7 % (ref 0.0–2.0)
BASOS ABS: 0 10*3/uL (ref 0.0–0.1)
EOS%: 3.5 % (ref 0.0–7.0)
Eosinophils Absolute: 0.2 10*3/uL (ref 0.0–0.5)
HCT: 35.8 % (ref 34.8–46.6)
HEMOGLOBIN: 11.6 g/dL (ref 11.6–15.9)
LYMPH%: 17.6 % (ref 14.0–49.7)
MCH: 31.6 pg (ref 25.1–34.0)
MCHC: 32.3 g/dL (ref 31.5–36.0)
MCV: 97.9 fL (ref 79.5–101.0)
MONO#: 0.3 10*3/uL (ref 0.1–0.9)
MONO%: 6.8 % (ref 0.0–14.0)
NEUT#: 3.3 10*3/uL (ref 1.5–6.5)
NEUT%: 71.4 % (ref 38.4–76.8)
PLATELETS: 255 10*3/uL (ref 145–400)
RBC: 3.65 10*6/uL — ABNORMAL LOW (ref 3.70–5.45)
RDW: 15.1 % — ABNORMAL HIGH (ref 11.2–14.5)
WBC: 4.6 10*3/uL (ref 3.9–10.3)
lymph#: 0.8 10*3/uL — ABNORMAL LOW (ref 0.9–3.3)

## 2014-03-12 MED ORDER — DIPHENHYDRAMINE HCL 50 MG/ML IJ SOLN
INTRAMUSCULAR | Status: AC
  Filled 2014-03-12: qty 1

## 2014-03-12 MED ORDER — SODIUM CHLORIDE 0.9 % IJ SOLN
10.0000 mL | INTRAMUSCULAR | Status: DC | PRN
  Administered 2014-03-12: 10 mL
  Filled 2014-03-12: qty 10

## 2014-03-12 MED ORDER — SODIUM CHLORIDE 0.9 % IV SOLN
Freq: Once | INTRAVENOUS | Status: AC
  Administered 2014-03-12: 15:00:00 via INTRAVENOUS

## 2014-03-12 MED ORDER — HEPARIN SOD (PORK) LOCK FLUSH 100 UNIT/ML IV SOLN
500.0000 [IU] | Freq: Once | INTRAVENOUS | Status: AC | PRN
  Administered 2014-03-12: 500 [IU]
  Filled 2014-03-12: qty 5

## 2014-03-12 MED ORDER — FAMOTIDINE IN NACL 20-0.9 MG/50ML-% IV SOLN
20.0000 mg | Freq: Once | INTRAVENOUS | Status: AC
  Administered 2014-03-12: 20 mg via INTRAVENOUS

## 2014-03-12 MED ORDER — PACLITAXEL CHEMO INJECTION 300 MG/50ML
65.0000 mg/m2 | Freq: Once | INTRAVENOUS | Status: AC
  Administered 2014-03-12: 126 mg via INTRAVENOUS
  Filled 2014-03-12: qty 21

## 2014-03-12 MED ORDER — DEXAMETHASONE SODIUM PHOSPHATE 10 MG/ML IJ SOLN
INTRAMUSCULAR | Status: AC
  Filled 2014-03-12: qty 1

## 2014-03-12 MED ORDER — ONDANSETRON 8 MG/NS 50 ML IVPB
INTRAVENOUS | Status: AC
  Filled 2014-03-12: qty 8

## 2014-03-12 MED ORDER — ONDANSETRON 8 MG/50ML IVPB (CHCC)
8.0000 mg | Freq: Once | INTRAVENOUS | Status: AC
  Administered 2014-03-12: 8 mg via INTRAVENOUS

## 2014-03-12 MED ORDER — DEXAMETHASONE SODIUM PHOSPHATE 10 MG/ML IJ SOLN
4.0000 mg | Freq: Once | INTRAMUSCULAR | Status: AC
  Administered 2014-03-12: 4 mg via INTRAVENOUS

## 2014-03-12 MED ORDER — DIPHENHYDRAMINE HCL 50 MG/ML IJ SOLN
25.0000 mg | Freq: Once | INTRAMUSCULAR | Status: AC
  Administered 2014-03-12: 25 mg via INTRAVENOUS

## 2014-03-12 MED ORDER — FAMOTIDINE IN NACL 20-0.9 MG/50ML-% IV SOLN
INTRAVENOUS | Status: AC
  Filled 2014-03-12: qty 50

## 2014-03-12 NOTE — Patient Instructions (Signed)
[facility] Discharge Instructions for Patients Receiving Chemotherapy  Today you received the following chemotherapy agents: Taxol.  To help prevent nausea and vomiting after your treatment, we encourage you to take your nausea medication as prescribed.   If you develop nausea and vomiting that is not controlled by your nausea medication, call the clinic.   BELOW ARE SYMPTOMS THAT SHOULD BE REPORTED IMMEDIATELY:  *FEVER GREATER THAN 100.5 F  *CHILLS WITH OR WITHOUT FEVER  NAUSEA AND VOMITING THAT IS NOT CONTROLLED WITH YOUR NAUSEA MEDICATION  *UNUSUAL SHORTNESS OF BREATH  *UNUSUAL BRUISING OR BLEEDING  TENDERNESS IN MOUTH AND THROAT WITH OR WITHOUT PRESENCE OF ULCERS  *URINARY PROBLEMS  *BOWEL PROBLEMS  UNUSUAL RASH Items with * indicate a potential emergency and should be followed up as soon as possible.  Feel free to call the clinic you have any questions or concerns. The clinic phone number is [phone].    

## 2014-03-12 NOTE — Progress Notes (Signed)
Shelly Hall  Telephone:([phone]) [phone] Fax:(336) [phone]    ID: Shelly Hall OB: 09/06/1970  MR#: [id number]  MGQ#:[id number]  PCP: No PCP Per Patient GYN:  [name] SU: [name] OTHER MD: Thea Silversmith  CHIEF COMPLAINT:  Left Breast Cancer CURRENT TREATMENT: Neoadjuvant Chemotherapy  BREAST CANCER HISTORY: Brystol palpated a mass in her left breast mid-April 2015 and immediately brought it to Dr. Benjie Karvonen' attention. She was set up for bilateral screening mammography with tomography at [facility] 09/19/2013. The possible distortion in the left breast and a nearby group of calcifications was felt to warrant further evaluation, and on 09/30/2013 she underwent unilateral left diagnostic mammography and ultrasonography. There was an irregular mass in the outer left breast associated with microcalcifications. 4 cm anterior to this there was another cluster of pleomorphic calcifications spanning 7 mm. He had more anteriorly there was an irregular mass associated with other calcifications. In total the abnormalities in the left breast spent approximately 10 cm, and is segmental distribution. On exam there was a palpable firm mass at the 2:30 o'clock position of the left breast 10 cm from the nipple. There was palpable adenopathy in the inferior left axilla. Ultrasound confirmed an irregular hypoechoic mass in the left breast measuring 4.6 cm. In addition, a 1.4 cm oval slightly irregular mass was noted at the 3:00 position and yet another mass measured 9 mm in the same area. Ultrasound of the left axilla showed a dominant 3.6 cm lymph node.  Biopsy of 2 of the left breast masses and the suspicious left breast lymph node on 10/03/2013 showed ([id number]) an invasive ductal carcinoma, grade 2, estrogen receptor 90% positive with strong staining intensity, progesterone receptor 40% positive but moderate staining intensity, with an MIB-1 of 20% and no HER-2 amplification.  (Because all 3 biopsies were morphologically identical, only one prognostic panel was sent).  On 10/10/2013 the patient underwent bilateral breast MRI. This showed numerous enhancing masses in the left breast, the largest measuring 3.5 cm, and the aggregate measuring 12.7 cm. There were numerous enlarged left axillary lymph nodes, at least 5 of which were enlarged, both at levels 1 and 2. The largest measured 2.3 cm. The right breast was negative.  The patient's subsequent history is as detailed below.  INTERVAL HISTORY: [name] returns today for follow up of her breast cancer. Today is day 1, cycle 11 of 12 planned weekly cycles of paclitaxel. She is doing well. Her only new complaint concerns some shooting pain in her left breast. This has been going on for the last month.    REVIEW OF SYSTEMS: Sheyenne denies fevers, chills, nausea, vomiting, or changes in bowel or bladder habits. She has some mild shortness of breath, but denies chest pain, cough, palpations, and fatigue. Her appetite is stable. The numbness to her bilateral great toes is unchanged. A detailed review of systems is otherwise noncontributory.  PAST MEDICAL HISTORY: Past Medical History  Diagnosis Date  . Diabetes mellitus   . Hypertension   . Sarcoidosis   . Anxiety   . Depression   . Anemia   . Complication of anesthesia     makes pt. restless and unable to be still  . Cancer     breast cancer    PAST SURGICAL HISTORY: Past Surgical History  Procedure Laterality Date  . Tubal ligation  2001  . Endobronchial ultrasound Bilateral 12/02/2012    Procedure: ENDOBRONCHIAL ULTRASOUND;  Surgeon: Collene Gobble, MD;  Location: [facility] END[facility];  Service: Cardiopulmonary;  Laterality: Bilateral;  . Breast surgery Left     breast biopsy times 3  . Portacath placement N/A 10/22/2013    Procedure: INSERTION PORT-A-CATH;  Surgeon: [name], MD;  Location: [facility] OR;  Service: General;  Laterality: N/A;    FAMILY HISTORY Family  History  Problem Relation Age of Onset  . Cancer Paternal Aunt     "bone cancer"; deceased 91s  . Cancer Paternal Uncle     stomach cancer; deceased 40s  . Cancer Paternal Aunt     unk. primary; currently 5s  . Prostate cancer Maternal Grandfather 85  The patient's parents are living, in fair mid to late 4s. The patient had one brother, no sisters. There is no history of breast or ovarian cancer in the family to her knowledge.   GYNECOLOGIC HISTORY:   (Reviewed 11/27/2013)  menarche age 28, first live birth at 69. She is GX P2. She was still having regular periods at the time of the start of her chemotherapy in May 2015.  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 She is single and lives alone, although her mother is currently staying with her. Her son Azuree Minish lives in [place] and works at [employer]. Son [name] lives in [place] where he works at a [employer]. The patient has 1 grandchild on the way. 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 Will be taking a break to go and visit her son who lives in [place], [place]: not in place   HEALTH MAINTENANCE: (Updated 11/27/2013) History  Substance Use Topics  . Smoking status: Former Smoker -- 1.00 packs/day for 23 years    Types: Cigarettes    Quit date: 10/19/2013  . Smokeless tobacco: Never Used  . Alcohol Use: 1.0 oz/week    2 drink(s) per week     Comment: occ. x2 dks per week     Colonoscopy: Never  PAP: Not on file  Bone density: Not on file  Lipid panel:  Not on file  Allergies  Allergen Reactions  . Nyquil Multi-Symptom [Pseudoeph-Doxylamine-Dm-Apap] Itching and Other (See Comments)    Skin peels on hands, and feet    Current Outpatient Prescriptions  Medication Sig Dispense Refill  . acyclovir (ZOVIRAX) 400 MG tablet Take 1 tablet (400 mg total) by mouth 2 (two) times daily. Beginning 11/12/2013  60  tablet  4  . albuterol (PROVENTIL HFA;VENTOLIN HFA) 108 (90 BASE) MCG/ACT inhaler Inhale 2 puffs into the lungs every 6 (six) hours as needed for wheezing or shortness of breath.      Marland Kitchen atenolol-chlorthalidone (TENORETIC) 50-25 MG per tablet Take 1 tablet by mouth daily.  30 tablet  1  . dexamethasone (DECADRON) 4 MG tablet Take 2 tablets (8 mg total) by mouth 2 (two) times daily with a meal. Start the day after chemotherapy for 2 days. Take with food.  30 tablet  1  . docusate sodium (COLACE) 100 MG capsule Take 100 mg by mouth daily.      Marland Kitchen doxycycline (VIBRA-TABS) 100 MG tablet       . ferrous sulfate 325 (65 FE) MG tablet Take 325 mg by mouth 2 (two) times daily with a meal.      . gabapentin (NEURONTIN) 300 MG capsule Take 300 mg by mouth at bedtime.      Marland Kitchen HYDROcodone-ibuprofen (VICOPROFEN) 7.5-200 MG per tablet       . lidocaine-prilocaine (EMLA)  cream Apply 1 application topically as needed. Apply over port area 1-2 hours before chemo and cover with plastic wrap  30 g  0  . Loratadine 10 MG CAPS Take 1 capsule (10 mg total) by mouth as directed.  30 each  3  . LORazepam (ATIVAN) 0.5 MG tablet Take 1 tablet (0.5 mg total) by mouth at bedtime as needed (Nausea or vomiting).  30 tablet  0  . metFORMIN (GLUCOPHAGE) 500 MG tablet Take 1 tablet (500 mg total) by mouth 2 (two) times daily with a meal.  60 tablet  1  . Multiple Vitamin (MULTIVITAMIN WITH MINERALS) TABS tablet Take 1 tablet by mouth daily.      . ondansetron (ZOFRAN) 8 MG tablet Take 1 tablet (8 mg total) by mouth 2 (two) times daily. Start the day after chemo for 2 days. Then take as needed for nausea or vomiting.  30 tablet  1  . oxyCODONE (OXY IR/ROXICODONE) 5 MG immediate release tablet Take 1 tablet (5 mg total) by mouth every 4 (four) hours as needed for severe pain.  30 tablet  0  . pantoprazole (PROTONIX) 40 MG tablet Take 1 tablet (40 mg total) by mouth daily.  30 tablet  5  . potassium chloride SA (K-DUR,KLOR-CON) 20 MEQ  tablet Take 20 mEq by mouth daily.      . prochlorperazine (COMPAZINE) 10 MG tablet Take 1 tablet (10 mg total) by mouth every 6 (six) hours as needed (Nausea or vomiting).  30 tablet  1  . potassium chloride SA (K-DUR,KLOR-CON) 20 MEQ tablet Take 1 tablet (20 mEq total) by mouth daily.  30 tablet  6   No current facility-administered medications for this visit.   Facility-Administered Medications Ordered in Other Visits  Medication Dose Route Frequency Provider Last Rate Last Dose  . heparin lock flush 100 unit/mL  500 Units Intracatheter Once PRN Chauncey Cruel, MD      . sodium chloride 0.9 % injection 10 mL  10 mL Intracatheter PRN Chauncey Cruel, MD        OBJECTIVE: Middle-aged African American woman in no acute distress Filed Vitals:   03/12/14 1331  BP: 133/94  Pulse: 77  Temp: 98.4 F (36.9 C)  Resp: 20     Body mass index is 31.98 kg/(m^2).    ECOG FS:1 - Symptomatic but completely ambulatory  Skin: warm, dry, hyperpigmentation to nailbeds and palms resolving  HEENT: sclerae anicteric, conjunctivae pink, oropharynx clear. No thrush or mucositis.  Lymph Nodes: No cervical or supraclavicular lymphadenopathy  Lungs: clear to auscultation bilaterally, no rales, wheezes, or rhonci  Heart: regular rate and rhythm  Abdomen: round, soft, non tender, positive bowel sounds  Musculoskeletal: No focal spinal tenderness, no peripheral edema  Neuro: non focal, well oriented, positive affect  Breasts: deferred  LAB RESULTS:   Lab Results  Component Value Date   WBC 4.6 03/12/2014   NEUTROABS 3.3 03/12/2014   HGB 11.6 03/12/2014   HCT 35.8 03/12/2014   MCV 97.9 03/12/2014   PLT 255 03/12/2014      Chemistry      Component Value Date/Time   NA 137 03/12/2014 1234   NA 140 10/21/2013 1158   K 3.7 03/12/2014 1234   K 3.9 10/21/2013 1158   CL 105 10/21/2013 1158   CO2 26 03/12/2014 1234   CO2 21 10/21/2013 1158   BUN 15.1 03/12/2014 1234   BUN 16 10/21/2013 1158   CREATININE 0.8  03/12/2014 1234  CREATININE 0.95 10/21/2013 1158      Component Value Date/Time   CALCIUM 10.0 03/12/2014 1234   CALCIUM 9.9 10/21/2013 1158   ALKPHOS 66 03/12/2014 1234   ALKPHOS 81 11/27/2012 0900   AST 19 03/12/2014 1234   AST 22 11/27/2012 0900   ALT 28 03/12/2014 1234   ALT 17 11/27/2012 0900   BILITOT <0.20 03/12/2014 1234   BILITOT 0.1* 11/27/2012 0900      STUDIES: No results found.  ASSESSMENT: 43 y.o. BRCA negative [place] woman   (1)  status post left breast and left axillary lymph node biopsy 10/03/2013, both positive for a clinical T2 N2, stage IIIA invasive ductal carcinoma, grade 2, estrogen and progesterone receptor positive, HER-2 negative, with an MIB-1 of 20%.  (2) Treated with neoadjuvant chemotherapy to consist of doxorubicin and cyclophosphamide in dose dense fashion x4, completed 12/11/2013, followed by paclitaxel weekly x12 (dose reduced by 20% at cycle 5 because of neuropathy concerns)  (2) definitive surgery to follow chemotherapy  (3) radiation to follow surgery  (4) tamoxifen to be started at the end of radiation  (5) continuing tobacco abuse: The patient has been strongly advised to discontinue smoking  (6) genetic testing sent 10/16/2013 did not reveal any clearly pathogenic (harmful) mutation in any of these genes. The genes tested were ATM, BARD1, BRCA1, BRCA2, BRIP1, CDH1, CHEK2, MRE11A, MUTYH, NBN, NF1, PALB2, PTEN, RAD50, RAD51C, RAD51D, and TP53.  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 The labs were reviewed in detail and continue to be completely stable. We will proceed with day 1, cycle 11 of taxol today.   Mayson will return next week for her final cycle of chemo. She will have a breast MRI at [facility] the following week. She meets with Dr. Barry Dienes on 10/26 and expects surgery to follow on 11/19. She  will meet with Dr. Jana Hakim 2 weeks later for follow up. Marline understands and agrees with this plan. She knows the goal of treatment in her case is cure. She has been encouraged to call with any issues that might arise before her next visit here.   Marcelino Duster, NP 03/12/2014 4:04 PM

## 2014-03-13 ENCOUNTER — Telehealth: Payer: Self-pay | Admitting: Nurse Practitioner

## 2014-03-13 NOTE — Telephone Encounter (Signed)
per pof to sch pt MRI-cld pt to adv to call GI to sch appt as they will need to ask pt questions b4 scheduling

## 2014-03-17 ENCOUNTER — Ambulatory Visit (HOSPITAL_COMMUNITY): Payer: No Typology Code available for payment source

## 2014-03-18 ENCOUNTER — Encounter: Payer: Self-pay | Admitting: Nurse Practitioner

## 2014-03-18 ENCOUNTER — Ambulatory Visit (HOSPITAL_BASED_OUTPATIENT_CLINIC_OR_DEPARTMENT_OTHER): Payer: No Typology Code available for payment source | Admitting: Nurse Practitioner

## 2014-03-18 ENCOUNTER — Other Ambulatory Visit (HOSPITAL_BASED_OUTPATIENT_CLINIC_OR_DEPARTMENT_OTHER): Payer: No Typology Code available for payment source

## 2014-03-18 ENCOUNTER — Other Ambulatory Visit (INDEPENDENT_AMBULATORY_CARE_PROVIDER_SITE_OTHER): Payer: Self-pay | Admitting: General Surgery

## 2014-03-18 VITALS — BP 125/79 | HR 78 | Temp 98.2°F | Resp 18 | Ht 64.0 in | Wt 186.1 lb

## 2014-03-18 DIAGNOSIS — E119 Type 2 diabetes mellitus without complications: Secondary | ICD-10-CM

## 2014-03-18 DIAGNOSIS — C773 Secondary and unspecified malignant neoplasm of axilla and upper limb lymph nodes: Secondary | ICD-10-CM

## 2014-03-18 DIAGNOSIS — C73 Malignant neoplasm of thyroid gland: Secondary | ICD-10-CM

## 2014-03-18 DIAGNOSIS — C50912 Malignant neoplasm of unspecified site of left female breast: Secondary | ICD-10-CM

## 2014-03-18 DIAGNOSIS — R198 Other specified symptoms and signs involving the digestive system and abdomen: Secondary | ICD-10-CM | POA: Insufficient documentation

## 2014-03-18 DIAGNOSIS — C50412 Malignant neoplasm of upper-outer quadrant of left female breast: Secondary | ICD-10-CM

## 2014-03-18 DIAGNOSIS — Z170 Estrogen receptor positive status [ER+]: Secondary | ICD-10-CM

## 2014-03-18 LAB — CBC WITH DIFFERENTIAL/PLATELET
BASO%: 0.4 % (ref 0.0–2.0)
BASOS ABS: 0 10*3/uL (ref 0.0–0.1)
EOS%: 2.8 % (ref 0.0–7.0)
Eosinophils Absolute: 0.1 10*3/uL (ref 0.0–0.5)
HCT: 35 % (ref 34.8–46.6)
HEMOGLOBIN: 11.4 g/dL — AB (ref 11.6–15.9)
LYMPH%: 17.9 % (ref 14.0–49.7)
MCH: 32.1 pg (ref 25.1–34.0)
MCHC: 32.7 g/dL (ref 31.5–36.0)
MCV: 98.2 fL (ref 79.5–101.0)
MONO#: 0.3 10*3/uL (ref 0.1–0.9)
MONO%: 7.2 % (ref 0.0–14.0)
NEUT#: 3 10*3/uL (ref 1.5–6.5)
NEUT%: 71.7 % (ref 38.4–76.8)
Platelets: 263 10*3/uL (ref 145–400)
RBC: 3.57 10*6/uL — ABNORMAL LOW (ref 3.70–5.45)
RDW: 14.8 % — AB (ref 11.2–14.5)
WBC: 4.2 10*3/uL (ref 3.9–10.3)
lymph#: 0.7 10*3/uL — ABNORMAL LOW (ref 0.9–3.3)

## 2014-03-18 LAB — COMPREHENSIVE METABOLIC PANEL (CC13)
ALK PHOS: 72 U/L (ref 40–150)
ALT: 31 U/L (ref 0–55)
AST: 21 U/L (ref 5–34)
Albumin: 3.7 g/dL (ref 3.5–5.0)
Anion Gap: 11 mEq/L (ref 3–11)
BUN: 13.1 mg/dL (ref 7.0–26.0)
CO2: 24 mEq/L (ref 22–29)
CREATININE: 1.1 mg/dL (ref 0.6–1.1)
Calcium: 10 mg/dL (ref 8.4–10.4)
Chloride: 104 mEq/L (ref 98–109)
Glucose: 206 mg/dl — ABNORMAL HIGH (ref 70–140)
Potassium: 4.6 mEq/L (ref 3.5–5.1)
Sodium: 139 mEq/L (ref 136–145)
Total Bilirubin: <0.2 mg/dL (ref 0.20–1.20)
Total Protein: 7.8 g/dL (ref 6.4–8.3)

## 2014-03-18 NOTE — Progress Notes (Signed)
Shelly Hall  Telephone:(336) [phone] Fax:(336) [phone]    ID: Shelly Hall OB: Dec 12, 1970  MR#: [id number]  [facility]#:[id number]  PCP: No PCP Per Patient GYN:  Shelly Hall SU: Shelly Hall OTHER MD: Shelly Hall  CHIEF COMPLAINT:  Left Breast Cancer CURRENT TREATMENT: Neoadjuvant Chemotherapy  BREAST CANCER HISTORY: Shelly Hall palpated a mass in her left breast mid-April 2015 and immediately brought it to Dr. Benjie Hall' attention. She was set up for bilateral screening mammography with tomography at [facility] 09/19/2013. The possible distortion in the left breast and a nearby group of calcifications was felt to warrant further evaluation, and on 09/30/2013 she underwent unilateral left diagnostic mammography and ultrasonography. There was an irregular mass in the outer left breast associated with microcalcifications. 4 cm anterior to this there was another cluster of pleomorphic calcifications spanning 7 mm. He had more anteriorly there was an irregular mass associated with other calcifications. In total the abnormalities in the left breast spent approximately 10 cm, and is segmental distribution. On exam there was a palpable firm mass at the 2:30 o'clock position of the left breast 10 cm from the nipple. There was palpable adenopathy in the inferior left axilla. Ultrasound confirmed an irregular hypoechoic mass in the left breast measuring 4.6 cm. In addition, a 1.4 cm oval slightly irregular mass was noted at the 3:00 position and yet another mass measured 9 mm in the same area. Ultrasound of the left axilla showed a dominant 3.6 cm lymph node.  Biopsy of 2 of the left breast masses and the suspicious left breast lymph node on 10/03/2013 showed ([id number]) an invasive ductal carcinoma, grade 2, estrogen receptor 90% positive with strong staining intensity, progesterone receptor 40% positive but moderate staining intensity, with an MIB-1 of 20% and no HER-2 amplification.  (Because all 3 biopsies were morphologically identical, only one prognostic panel was sent).  On 10/10/2013 the patient underwent bilateral breast MRI. This showed numerous enhancing masses in the left breast, the largest measuring 3.5 cm, and the aggregate measuring 12.7 cm. There were numerous enlarged left axillary lymph nodes, at least 5 of which were enlarged, both at levels 1 and 2. The largest measured 2.3 cm. The right breast was negative.  The patient's subsequent history is as detailed below.  INTERVAL HISTORY: Shelly Hall returns today for follow up of her breast cancer. Today is day 7, cycle 11 of 12 planned weekly cycles of paclitaxel. She is expected to complete her final cycle tomorrow. She is excited that her journey is coming to an end. At this point her only complaint is tooth/gum sensitivity when she bites in to hard foods. She denies mouth sores or thrush. She brushes her teeth with crest and uses baking soda and warm water to rinse her mouth. She notices some sensitivity when going from cold to warm foods during the same meal, or vice versa.    REVIEW OF SYSTEMS: Shelly Hall denies fevers, chills, nausea, vomiting, or changes in bowel or bladder habits. She has some mild shortness of breath, but denies chest pain, cough, palpations, and fatigue. Her appetite is stable. The numbness to her bilateral great toes is resolving. A detailed review of systems is otherwise noncontributory.  PAST MEDICAL HISTORY: Past Medical History  Diagnosis Date  . Diabetes mellitus   . Hypertension   . Sarcoidosis   . Anxiety   . Depression   . Anemia   . Complication of anesthesia     makes pt. restless and unable to be  still  . Cancer     breast cancer    PAST SURGICAL HISTORY: Past Surgical History  Procedure Laterality Date  . Tubal ligation  2001  . Endobronchial ultrasound Bilateral 12/02/2012    Procedure: ENDOBRONCHIAL ULTRASOUND;  Surgeon: Collene Gobble, MD;  Location: [facility];   Service: Cardiopulmonary;  Laterality: Bilateral;  . Breast surgery Left     breast biopsy times 3  . Portacath placement N/A 10/22/2013    Procedure: INSERTION PORT-A-CATH;  Surgeon: Shelly Klein, MD;  Location: [facility] OR;  Service: General;  Laterality: N/A;    FAMILY HISTORY Family History  Problem Relation Age of Onset  . Cancer Paternal Aunt     "bone cancer"; deceased 80s  . Cancer Paternal Uncle     stomach cancer; deceased 23s  . Cancer Paternal Aunt     unk. primary; currently 11s  . Prostate cancer Maternal Grandfather 36  The patient's parents are living, in fair mid to late 51s. The patient had one brother, no sisters. There is no history of breast or ovarian cancer in the family to her knowledge.   GYNECOLOGIC HISTORY:   (Reviewed 11/27/2013)  menarche age 62, first live birth at 68. She is GX P2. She was still having regular periods at the time of the start of her chemotherapy in May 2015.  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 She is single and lives alone, although her mother is currently staying with her. Her son Shelly Hall lives in [place] and works at [employer]. Son Shelly Hall lives in [place] where he works at a [employer]. The patient has 1 grandchild born in Sept 2015, by her son, Shelly Hall. She is a Psychologist, forensic.    ADVANCED DIRECTIVES: not in place   HEALTH MAINTENANCE: (Updated 11/27/2013) History  Substance Use Topics  . Smoking status: Former Smoker -- 1.00 packs/day for 23 years    Types: Cigarettes    Quit date: 10/19/2013  . Smokeless tobacco: Never Used  . Alcohol Use: 1.0 oz/week    2 drink(s) per week     Comment: occ. x2 dks per week     Colonoscopy: Never  PAP: Not on file  Bone density: Not on file  Lipid panel:  Not on file  Allergies  Allergen Reactions  . Nyquil Multi-Symptom [Pseudoeph-Doxylamine-Dm-Apap] Itching and Other (See Comments)    Skin peels on hands, and  feet    Current Outpatient Prescriptions  Medication Sig Dispense Refill  . acyclovir (ZOVIRAX) 400 MG tablet Take 1 tablet (400 mg total) by mouth 2 (two) times daily. Beginning 11/12/2013  60 tablet  4  . atenolol-chlorthalidone (TENORETIC) 50-25 MG per tablet Take 1 tablet by mouth daily.  30 tablet  1  . docusate sodium (COLACE) 100 MG capsule Take 100 mg by mouth daily.      . ferrous sulfate 325 (65 FE) MG tablet Take 325 mg by mouth 2 (two) times daily with a meal.      . gabapentin (NEURONTIN) 300 MG capsule Take 300 mg by mouth at bedtime.      . Loratadine 10 MG CAPS Take 1 capsule (10 mg total) by mouth as directed.  30 each  3  . LORazepam (ATIVAN) 0.5 MG tablet Take 1 tablet (0.5 mg total) by mouth at bedtime as needed (Nausea or vomiting).  30 tablet  0  . metFORMIN (GLUCOPHAGE) 500 MG tablet Take 1 tablet (500 mg total) by mouth  2 (two) times daily with a meal.  60 tablet  1  . Multiple Vitamin (MULTIVITAMIN WITH MINERALS) TABS tablet Take 1 tablet by mouth daily.      . ondansetron (ZOFRAN) 8 MG tablet Take 1 tablet (8 mg total) by mouth 2 (two) times daily. Start the day after chemo for 2 days. Then take as needed for nausea or vomiting.  30 tablet  1  . pantoprazole (PROTONIX) 40 MG tablet Take 1 tablet (40 mg total) by mouth daily.  30 tablet  5  . potassium chloride SA (K-DUR,KLOR-CON) 20 MEQ tablet Take 20 mEq by mouth daily.      . prochlorperazine (COMPAZINE) 10 MG tablet Take 1 tablet (10 mg total) by mouth every 6 (six) hours as needed (Nausea or vomiting).  30 tablet  1  . albuterol (PROVENTIL HFA;VENTOLIN HFA) 108 (90 BASE) MCG/ACT inhaler Inhale 2 puffs into the lungs every 6 (six) hours as needed for wheezing or shortness of breath.      . dexamethasone (DECADRON) 4 MG tablet Take 2 tablets (8 mg total) by mouth 2 (two) times daily with a meal. Start the day after chemotherapy for 2 days. Take with food.  30 tablet  1  . HYDROcodone-ibuprofen (VICOPROFEN) 7.5-200 MG  per tablet       . lidocaine-prilocaine (EMLA) cream Apply 1 application topically as needed. Apply over port area 1-2 hours before chemo and cover with plastic wrap  30 g  0  . oxyCODONE (OXY IR/ROXICODONE) 5 MG immediate release tablet Take 1 tablet (5 mg total) by mouth every 4 (four) hours as needed for severe pain.  30 tablet  0  . potassium chloride SA (K-DUR,KLOR-CON) 20 MEQ tablet Take 1 tablet (20 mEq total) by mouth daily.  30 tablet  6   No current facility-administered medications for this visit.    OBJECTIVE: Middle-aged [place] American woman in no acute distress Filed Vitals:   03/18/14 1043  BP: 125/79  Pulse: 78  Temp: 98.2 F (36.8 C)  Resp: 18     Body mass index is 31.93 kg/(m^2).    ECOG FS:1 - Symptomatic but completely ambulatory  Skin: warm, dry  HEENT: sclerae anicteric, conjunctivae pink, oropharynx clear. No thrush or mucositis.  Lymph Nodes: No cervical or supraclavicular lymphadenopathy  Lungs: clear to auscultation bilaterally, no rales, wheezes, or rhonci  Heart: regular rate and rhythm  Abdomen: round, soft, non tender, positive bowel sounds  Musculoskeletal: No focal spinal tenderness, no peripheral edema  Neuro: non focal, well oriented, positive affect  Breasts: previously easily palpable mass in left breast no longer found. No skin or nipple changes. Left axilla benign. Right breast unremarkable.   LAB RESULTS:   Lab Results  Component Value Date   WBC 4.2 03/18/2014   NEUTROABS 3.0 03/18/2014   HGB 11.4* 03/18/2014   HCT 35.0 03/18/2014   MCV 98.2 03/18/2014   PLT 263 03/18/2014      Chemistry      Component Value Date/Time   NA 139 03/18/2014 1034   NA 140 10/21/2013 1158   K 4.6 03/18/2014 1034   K 3.9 10/21/2013 1158   CL 105 10/21/2013 1158   CO2 24 03/18/2014 1034   CO2 21 10/21/2013 1158   BUN 13.1 03/18/2014 1034   BUN 16 10/21/2013 1158   CREATININE 1.1 03/18/2014 1034   CREATININE 0.95 10/21/2013 1158      Component Value  Date/Time   CALCIUM 10.0 03/18/2014 1034  CALCIUM 9.9 10/21/2013 1158   ALKPHOS 72 03/18/2014 1034   ALKPHOS 81 11/27/2012 0900   AST 21 03/18/2014 1034   AST 22 11/27/2012 0900   ALT 31 03/18/2014 1034   ALT 17 11/27/2012 0900   BILITOT <0.20 03/18/2014 1034   BILITOT 0.1* 11/27/2012 0900      STUDIES: No results found.  ASSESSMENT: 43 y.o. BRCA negative [place] woman   (1)  status post left breast and left axillary lymph node biopsy 10/03/2013, both positive for a clinical T2 N2, stage IIIA invasive ductal carcinoma, grade 2, estrogen and progesterone receptor positive, HER-2 negative, with an MIB-1 of 20%.  (2) Treated with neoadjuvant chemotherapy to consist of doxorubicin and cyclophosphamide in dose dense fashion x4, completed 12/11/2013, followed by paclitaxel weekly x12 (dose reduced by 20% at cycle 5 because of neuropathy concerns)  (2) definitive surgery to follow chemotherapy  (3) radiation to follow surgery  (4) tamoxifen to be started at the end of radiation  (5) continuing tobacco abuse: The patient has been strongly advised to discontinue smoking  (6) genetic testing sent 10/16/2013 did not reveal any clearly pathogenic (harmful) mutation in any of these genes. The genes tested were ATM, BARD1, BRCA1, BRCA2, BRIP1, CDH1, CHEK2, MRE11A, MUTYH, NBN, NF1, PALB2, PTEN, RAD50, RAD51C, RAD51D, and TP53.  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 The labs were reviewed in detail and were stable. Her blood sugars continue to be elevated, reaching 202 today. She no longer takes dexamethasone at home and only received 49m IV bas a premed before chemo. She has not had a recent A1c performed and is on metformin 5026mBID currently. She states that she will make an appointment with her primary care doctor to make the adjustments necessary. She will proceed with her 12th and final dose  of chemotherapy tomorrow. As for her gum sensitivity, I encouraged her to switch to a sensitive toothpaste, and try biotene mouthwash twice per day. She will be making a dentist appointment soon to follow up with this issue as well.  Shelly Hall have her breast MRI next week and meets with Dr. ByBarry Dieneso review a surgery. She is already scheduled for the procedure on 11/19. She will meet with Dr. MaJana Hakim weeks later for follow up. JuMarielanderstands and agrees with this plan. She knows the goal of treatment in her case is cure. She has been encouraged to call with any issues that might arise before her next visit here.    FeMarcelino DusterNP 03/18/2014 11:27 AM

## 2014-03-19 ENCOUNTER — Ambulatory Visit (HOSPITAL_BASED_OUTPATIENT_CLINIC_OR_DEPARTMENT_OTHER): Payer: No Typology Code available for payment source

## 2014-03-19 VITALS — BP 133/89 | HR 79 | Temp 98.0°F

## 2014-03-19 DIAGNOSIS — Z5111 Encounter for antineoplastic chemotherapy: Secondary | ICD-10-CM

## 2014-03-19 DIAGNOSIS — C50412 Malignant neoplasm of upper-outer quadrant of left female breast: Secondary | ICD-10-CM

## 2014-03-19 MED ORDER — HEPARIN SOD (PORK) LOCK FLUSH 100 UNIT/ML IV SOLN
500.0000 [IU] | Freq: Once | INTRAVENOUS | Status: AC | PRN
  Administered 2014-03-19: 500 [IU]
  Filled 2014-03-19: qty 5

## 2014-03-19 MED ORDER — DEXAMETHASONE SODIUM PHOSPHATE 10 MG/ML IJ SOLN
4.0000 mg | Freq: Once | INTRAMUSCULAR | Status: AC
  Administered 2014-03-19: 4 mg via INTRAVENOUS

## 2014-03-19 MED ORDER — DEXAMETHASONE SODIUM PHOSPHATE 10 MG/ML IJ SOLN
INTRAMUSCULAR | Status: AC
  Filled 2014-03-19: qty 1

## 2014-03-19 MED ORDER — ONDANSETRON 8 MG/NS 50 ML IVPB
INTRAVENOUS | Status: AC
  Filled 2014-03-19: qty 8

## 2014-03-19 MED ORDER — DIPHENHYDRAMINE HCL 50 MG/ML IJ SOLN
INTRAMUSCULAR | Status: AC
  Filled 2014-03-19: qty 1

## 2014-03-19 MED ORDER — SODIUM CHLORIDE 0.9 % IV SOLN
Freq: Once | INTRAVENOUS | Status: AC
  Administered 2014-03-19: 14:00:00 via INTRAVENOUS

## 2014-03-19 MED ORDER — SODIUM CHLORIDE 0.9 % IJ SOLN
10.0000 mL | INTRAMUSCULAR | Status: DC | PRN
  Administered 2014-03-19: 10 mL
  Filled 2014-03-19: qty 10

## 2014-03-19 MED ORDER — FAMOTIDINE IN NACL 20-0.9 MG/50ML-% IV SOLN
INTRAVENOUS | Status: AC
  Filled 2014-03-19: qty 50

## 2014-03-19 MED ORDER — DIPHENHYDRAMINE HCL 50 MG/ML IJ SOLN
25.0000 mg | Freq: Once | INTRAMUSCULAR | Status: AC
  Administered 2014-03-19: 25 mg via INTRAVENOUS

## 2014-03-19 MED ORDER — ONDANSETRON 8 MG/50ML IVPB (CHCC)
8.0000 mg | Freq: Once | INTRAVENOUS | Status: AC
  Administered 2014-03-19: 8 mg via INTRAVENOUS

## 2014-03-19 MED ORDER — FAMOTIDINE IN NACL 20-0.9 MG/50ML-% IV SOLN
20.0000 mg | Freq: Once | INTRAVENOUS | Status: AC
  Administered 2014-03-19: 20 mg via INTRAVENOUS

## 2014-03-19 MED ORDER — PACLITAXEL CHEMO INJECTION 300 MG/50ML
65.0000 mg/m2 | Freq: Once | INTRAVENOUS | Status: AC
  Administered 2014-03-19: 126 mg via INTRAVENOUS
  Filled 2014-03-19: qty 21

## 2014-03-19 NOTE — Patient Instructions (Signed)
[facility] Discharge Instructions for Patients Receiving Chemotherapy  Today you received the following chemotherapy agents: Taxol.  To help prevent nausea and vomiting after your treatment, we encourage you to take your nausea medication: Compazine 10 mg every 6 hours as needed.   If you develop nausea and vomiting that is not controlled by your nausea medication, call the clinic.   BELOW ARE SYMPTOMS THAT SHOULD BE REPORTED IMMEDIATELY:  *FEVER GREATER THAN 100.5 F  *CHILLS WITH OR WITHOUT FEVER  NAUSEA AND VOMITING THAT IS NOT CONTROLLED WITH YOUR NAUSEA MEDICATION  *UNUSUAL SHORTNESS OF BREATH  *UNUSUAL BRUISING OR BLEEDING  TENDERNESS IN MOUTH AND THROAT WITH OR WITHOUT PRESENCE OF ULCERS  *URINARY PROBLEMS  *BOWEL PROBLEMS  UNUSUAL RASH Items with * indicate a potential emergency and should be followed up as soon as possible.  Feel free to call the clinic you have any questions or concerns. The clinic phone number is [phone].    

## 2014-03-23 ENCOUNTER — Ambulatory Visit
Admission: RE | Admit: 2014-03-23 | Discharge: 2014-03-23 | Disposition: A | Discharge status: Home / Self Care | Payer: No Typology Code available for payment source | Source: Ambulatory Visit | Attending: Nurse Practitioner | Admitting: Nurse Practitioner

## 2014-03-23 DIAGNOSIS — C50412 Malignant neoplasm of upper-outer quadrant of left female breast: Secondary | ICD-10-CM

## 2014-03-23 MED ORDER — GADOBENATE DIMEGLUMINE 529 MG/ML IV SOLN
17.0000 mL | Freq: Once | INTRAVENOUS | Status: AC | PRN
  Administered 2014-03-23: 17 mL via INTRAVENOUS

## 2014-03-30 ENCOUNTER — Encounter: Payer: Self-pay | Admitting: Internal Medicine

## 2014-03-30 ENCOUNTER — Other Ambulatory Visit (INDEPENDENT_AMBULATORY_CARE_PROVIDER_SITE_OTHER): Payer: Self-pay | Admitting: *Deleted

## 2014-03-30 ENCOUNTER — Ambulatory Visit (INDEPENDENT_AMBULATORY_CARE_PROVIDER_SITE_OTHER): Payer: No Typology Code available for payment source | Admitting: General Surgery

## 2014-03-30 DIAGNOSIS — C50912 Malignant neoplasm of unspecified site of left female breast: Secondary | ICD-10-CM

## 2014-03-30 NOTE — Progress Notes (Signed)
Insurance is paying Tax inspector

## 2014-03-31 ENCOUNTER — Telehealth: Payer: Self-pay

## 2014-03-31 NOTE — Telephone Encounter (Signed)
Called and talked with Chasta's mother and told her that I would call back on Friday 04/03/14.

## 2014-04-06 ENCOUNTER — Telehealth: Payer: Self-pay | Admitting: *Deleted

## 2014-04-06 ENCOUNTER — Other Ambulatory Visit: Payer: Self-pay | Admitting: Nurse Practitioner

## 2014-04-06 NOTE — Telephone Encounter (Signed)
Pt left VM stating onset of " tingling and numbness in both of my feet " and requested a return call.  Call returned - spoke with pt's mother who states pt is in the shower.  Discussed with pt's mother above noted- pt has completed chemo therapy .  Can use OTC b6 tablets and continue to monitor.

## 2014-04-09 ENCOUNTER — Other Ambulatory Visit: Payer: Self-pay | Admitting: *Deleted

## 2014-04-09 DIAGNOSIS — C50412 Malignant neoplasm of upper-outer quadrant of left female breast: Secondary | ICD-10-CM

## 2014-04-09 MED ORDER — GABAPENTIN 300 MG PO CAPS: 300.0000 mg | ORAL_CAPSULE | Freq: Every day | ORAL | Status: DC

## 2014-04-09 MED ORDER — LORAZEPAM 0.5 MG PO TABS: 0.5000 mg | ORAL_TABLET | Freq: Every evening | ORAL | Status: DC | PRN

## 2014-04-15 ENCOUNTER — Other Ambulatory Visit: Payer: Self-pay

## 2014-04-15 ENCOUNTER — Encounter (HOSPITAL_COMMUNITY)
Admission: RE | Admit: 2014-04-15 | Discharge: 2014-04-15 | Disposition: A | Discharge status: Home / Self Care | Payer: No Typology Code available for payment source | Source: Ambulatory Visit | Attending: General Surgery | Admitting: General Surgery

## 2014-04-15 ENCOUNTER — Encounter (HOSPITAL_COMMUNITY): Payer: Self-pay

## 2014-04-15 DIAGNOSIS — Z01818 Encounter for other preprocedural examination: Secondary | ICD-10-CM | POA: Diagnosis not present

## 2014-04-15 DIAGNOSIS — E039 Hypothyroidism, unspecified: Secondary | ICD-10-CM

## 2014-04-15 DIAGNOSIS — C50912 Malignant neoplasm of unspecified site of left female breast: Secondary | ICD-10-CM | POA: Diagnosis not present

## 2014-04-15 DIAGNOSIS — E079 Disorder of thyroid, unspecified: Secondary | ICD-10-CM | POA: Diagnosis not present

## 2014-04-15 DIAGNOSIS — Z01810 Encounter for preprocedural cardiovascular examination: Secondary | ICD-10-CM | POA: Insufficient documentation

## 2014-04-15 DIAGNOSIS — Z01812 Encounter for preprocedural laboratory examination: Secondary | ICD-10-CM | POA: Diagnosis present

## 2014-04-15 LAB — BASIC METABOLIC PANEL
Anion gap: 17 — ABNORMAL HIGH (ref 5–15)
BUN: 16 mg/dL (ref 6–23)
CO2: 23 mEq/L (ref 19–32)
Calcium: 9.9 mg/dL (ref 8.4–10.5)
Chloride: 97 mEq/L (ref 96–112)
Creatinine, Ser: 0.82 mg/dL (ref 0.50–1.10)
GFR, EST NON AFRICAN AMERICAN: 85 mL/min — AB (ref >90)
Glucose, Bld: 151 mg/dL — ABNORMAL HIGH (ref 70–99)
POTASSIUM: 3.8 meq/L (ref 3.7–5.3)
SODIUM: 137 meq/L (ref 137–147)

## 2014-04-15 LAB — CBC
HCT: 38 % (ref 36.0–46.0)
Hemoglobin: 12.5 g/dL (ref 12.0–15.0)
MCH: 31 pg (ref 26.0–34.0)
MCHC: 32.9 g/dL (ref 30.0–36.0)
MCV: 94.3 fL (ref 78.0–100.0)
Platelets: 231 10*3/uL (ref 150–400)
RBC: 4.03 MIL/uL (ref 3.87–5.11)
RDW: 13.7 % (ref 11.5–15.5)
WBC: 5.1 10*3/uL (ref 4.0–10.5)

## 2014-04-15 NOTE — Pre-Procedure Instructions (Signed)
McCleary L Umscheid  04/15/2014   Your procedure is scheduled on:  04/23/14  Report to [facility] Admitting at 730 AM.  Call this number if you have problems the morning of surgery: [phone]   Remember:   Do not eat food or drink liquids after midnight.   Take these medicines the morning of surgery with A SIP OF WATER: all inhalers,atenolol,neurontin,hydrocodone,protonix   Do not wear jewelry, make-up or nail polish.  Do not wear lotions, powders, or perfumes. You may wear deodorant.  Do not shave 48 hours prior to surgery. Men may shave face and neck.  Do not bring valuables to the hospital.  [facility] is not responsible                  for any belongings or valuables.               Contacts, dentures or bridgework may not be worn into surgery.  Leave suitcase in the car. After surgery it may be brought to your room.  For patients admitted to the hospital, discharge time is determined by your                treatment team.               Patients discharged the day of surgery will not be allowed to drive  home.  Name and phone number of your driver: family  Special Instructions: Incentive Spirometry - Practice and bring it with you on the day of surgery.   Please read over the following fact sheets that you were given: Pain Booklet, Coughing and Deep Breathing and Surgical Site Infection Prevention

## 2014-04-17 ENCOUNTER — Encounter (HOSPITAL_COMMUNITY): Payer: Self-pay

## 2014-04-17 NOTE — Progress Notes (Addendum)
Anesthesia Chart Review: Patient is a 43 year old female posted for left breast lumpectomy with needle localization and axillary LN dissection, left thyroid lobectomy on 04/23/14 by Dr. Barry Dienes. (Orders were still pending at her PAT visit.  Patient reports she is actually having a mastectomy now.)  History includes left breast cancer s/p Port-a-cath 10/2013 and chemotherapy, DM2, smoking, Sarcoidosis 12/2012, HTN, anxiety, depression, anemia, tubal ligation. She reported that anesthesia makes patient restless.  BMI is consistent with mild obesity.  PCP is with [facility]. HEM-ONC Dr. Jana Hakim.  EKG on 04/15/14: NSR, LVH, T wave abnormality, consider anterior ischemia. Negative T waves are new when compared to 01/03/12 EKG--she had more diffuse flat T waves at that time. No CV symptoms documented at her PAT visit. I called and spoke with patient.  She denies chest pain, CAD, MI, or CHF history.  No SOB.  Denies symptoms related to her Sarcoidosis.  No edema or palpitations.  She denies any exercise limitations and reports she can walk up two flights of stairs without difficulty.    Echo on 10/29/13:  - Left ventricle: The cavity size was normal. There was moderate concentric hypertrophy. Systolic function was normal. The estimated ejection fraction was in the range of 55% to 60%. Wall motion was normal; there were no regional wall motion abnormalities. - Left atrium: The atrium was mildly dilated. - Atrial septum: No defect or patent foramen ovale was identified. Impressions: Global LV longitudinal strain -16.7%.  Thyroid ultrasound 10/31/13: 10 mm solid well-circumscribed left upper pole thyroid nodule correlates with the hypermetabolic PET-CT abnormality. Because of these findings, recommend ultrasound FNA biopsy. Incidental right inferior 6 mm nodule. (S/P left thyroid nodule FNA 11/25/13: suspicious for follicular neoplasm.)  46/65/99 CXR: Bilateral hilar and right peritracheal  adenopathy. This is stable and therefore most likely related to sarcoidosis rather than lymphoma or metastatic disease.  Preoperative labs noted.  Nursing staff to assess need for serum pregnancy test on the day of surgery and do if indicated.  (She denied known history of abnormal thyroid lab studies. No recent thyroid study lab results at [facility].)  Reviewed above with EKGs with anesthesiologist Dr. Marcie Bal.  Patient reports activity level of at least 4 METS.  No CV symptoms.  If no acute changes then it is anticipated that she can proceed as planned.  George Hugh [facility]/Anesthesiology Phone [phone] 04/17/2014 2:21 PM

## 2014-04-23 ENCOUNTER — Ambulatory Visit (HOSPITAL_COMMUNITY): Payer: No Typology Code available for payment source | Admitting: Vascular Surgery

## 2014-04-23 ENCOUNTER — Inpatient Hospital Stay (HOSPITAL_COMMUNITY)
Admission: RE | Admit: 2014-04-23 | Discharge: 2014-04-25 | DRG: 580 | Disposition: A | Discharge status: Home / Self Care | Payer: No Typology Code available for payment source | Source: Ambulatory Visit | Attending: General Surgery | Admitting: General Surgery

## 2014-04-23 ENCOUNTER — Ambulatory Visit (HOSPITAL_COMMUNITY)
Admission: RE | Admit: 2014-04-23 | Discharge: 2014-04-23 | Disposition: A | Discharge status: Home / Self Care | Payer: No Typology Code available for payment source | Source: Ambulatory Visit | Attending: General Surgery | Admitting: General Surgery

## 2014-04-23 ENCOUNTER — Ambulatory Visit (HOSPITAL_COMMUNITY): Payer: No Typology Code available for payment source | Admitting: Anesthesiology

## 2014-04-23 ENCOUNTER — Encounter (HOSPITAL_COMMUNITY): Payer: Self-pay | Admitting: General Surgery

## 2014-04-23 ENCOUNTER — Encounter (HOSPITAL_COMMUNITY)
Admission: RE | Disposition: A | Discharge status: Home / Self Care | Payer: Self-pay | Source: Ambulatory Visit | Attending: General Surgery

## 2014-04-23 DIAGNOSIS — M898X9 Other specified disorders of bone, unspecified site: Secondary | ICD-10-CM

## 2014-04-23 DIAGNOSIS — Z170 Estrogen receptor positive status [ER+]: Secondary | ICD-10-CM

## 2014-04-23 DIAGNOSIS — C50912 Malignant neoplasm of unspecified site of left female breast: Secondary | ICD-10-CM

## 2014-04-23 DIAGNOSIS — E041 Nontoxic single thyroid nodule: Secondary | ICD-10-CM | POA: Diagnosis present

## 2014-04-23 DIAGNOSIS — C50412 Malignant neoplasm of upper-outer quadrant of left female breast: Principal | ICD-10-CM | POA: Diagnosis present

## 2014-04-23 DIAGNOSIS — Z79899 Other long term (current) drug therapy: Secondary | ICD-10-CM

## 2014-04-23 DIAGNOSIS — C50919 Malignant neoplasm of unspecified site of unspecified female breast: Secondary | ICD-10-CM | POA: Diagnosis present

## 2014-04-23 DIAGNOSIS — Z9221 Personal history of antineoplastic chemotherapy: Secondary | ICD-10-CM

## 2014-04-23 DIAGNOSIS — C773 Secondary and unspecified malignant neoplasm of axilla and upper limb lymph nodes: Secondary | ICD-10-CM | POA: Diagnosis present

## 2014-04-23 HISTORY — DX: Pure hypercholesterolemia, unspecified: E78.00

## 2014-04-23 HISTORY — DX: Cardiac murmur, unspecified: R01.1

## 2014-04-23 HISTORY — PX: MASTECTOMY W/ SENTINEL NODE BIOPSY: SHX2001

## 2014-04-23 HISTORY — DX: Malignant neoplasm of unspecified site of unspecified female breast: C50.919

## 2014-04-23 HISTORY — PX: THYROID LOBECTOMY: SHX420

## 2014-04-23 HISTORY — DX: Malignant neoplasm of thyroid gland: C73

## 2014-04-23 HISTORY — DX: Type 2 diabetes mellitus without complications: E11.9

## 2014-04-23 LAB — HCG, SERUM, QUALITATIVE: PREG SERUM: NEGATIVE

## 2014-04-23 LAB — GLUCOSE, CAPILLARY: GLUCOSE-CAPILLARY: 132 mg/dL — AB (ref 70–99)

## 2014-04-23 SURGERY — MASTECTOMY WITH SENTINEL LYMPH NODE BIOPSY
Anesthesia: General | Site: Neck | Laterality: Left

## 2014-04-23 MED ORDER — METFORMIN HCL 500 MG PO TABS
500.0000 mg | ORAL_TABLET | Freq: Two times a day (BID) | ORAL | Status: DC
  Administered 2014-04-23 – 2014-04-25 (×4): 500 mg via ORAL
  Filled 2014-04-23 (×6): qty 1

## 2014-04-23 MED ORDER — PROPOFOL 10 MG/ML IV BOLUS
INTRAVENOUS | Status: DC | PRN
  Administered 2014-04-23: 170 mg via INTRAVENOUS

## 2014-04-23 MED ORDER — WHITE PETROLATUM GEL
Status: AC
  Administered 2014-04-23: 0.2
  Filled 2014-04-23: qty 5

## 2014-04-23 MED ORDER — OXYCODONE HCL 5 MG PO TABS
5.0000 mg | ORAL_TABLET | ORAL | Status: DC | PRN
  Administered 2014-04-23 – 2014-04-25 (×8): 10 mg via ORAL
  Filled 2014-04-23 (×8): qty 2

## 2014-04-23 MED ORDER — MEPERIDINE HCL 25 MG/ML IJ SOLN: 6.2500 mg | INTRAMUSCULAR | Status: DC | PRN

## 2014-04-23 MED ORDER — VECURONIUM BROMIDE 10 MG IV SOLR
INTRAVENOUS | Status: AC
  Filled 2014-04-23: qty 10

## 2014-04-23 MED ORDER — PROPOFOL 10 MG/ML IV BOLUS
INTRAVENOUS | Status: AC
  Filled 2014-04-23: qty 20

## 2014-04-23 MED ORDER — POTASSIUM CHLORIDE CRYS ER 20 MEQ PO TBCR
20.0000 meq | EXTENDED_RELEASE_TABLET | Freq: Every day | ORAL | Status: DC
  Administered 2014-04-23 – 2014-04-25 (×3): 20 meq via ORAL
  Filled 2014-04-23 (×3): qty 1

## 2014-04-23 MED ORDER — ALBUTEROL SULFATE (2.5 MG/3ML) 0.083% IN NEBU: 2.5000 mg | INHALATION_SOLUTION | Freq: Four times a day (QID) | RESPIRATORY_TRACT | Status: DC | PRN

## 2014-04-23 MED ORDER — ARTIFICIAL TEARS OP OINT
TOPICAL_OINTMENT | OPHTHALMIC | Status: DC | PRN
  Administered 2014-04-23: 1 via OPHTHALMIC

## 2014-04-23 MED ORDER — PROMETHAZINE HCL 25 MG/ML IJ SOLN: 6.2500 mg | INTRAMUSCULAR | Status: DC | PRN

## 2014-04-23 MED ORDER — SODIUM CHLORIDE 0.9 % IJ SOLN
INTRAMUSCULAR | Status: DC | PRN
  Administered 2014-04-23: 100 mL

## 2014-04-23 MED ORDER — LIDOCAINE HCL (CARDIAC) 20 MG/ML IV SOLN
INTRAVENOUS | Status: DC | PRN
  Administered 2014-04-23: 100 mg via INTRAVENOUS

## 2014-04-23 MED ORDER — ACYCLOVIR 400 MG PO TABS
400.0000 mg | ORAL_TABLET | Freq: Two times a day (BID) | ORAL | Status: DC
  Administered 2014-04-23 – 2014-04-25 (×3): 400 mg via ORAL
  Filled 2014-04-23 (×5): qty 1

## 2014-04-23 MED ORDER — PHENYLEPHRINE 40 MCG/ML (10ML) SYRINGE FOR IV PUSH (FOR BLOOD PRESSURE SUPPORT)
PREFILLED_SYRINGE | INTRAVENOUS | Status: AC
  Filled 2014-04-23: qty 10

## 2014-04-23 MED ORDER — ONDANSETRON HCL 4 MG/2ML IJ SOLN
INTRAMUSCULAR | Status: DC | PRN
  Administered 2014-04-23: 4 mg via INTRAVENOUS

## 2014-04-23 MED ORDER — FERROUS SULFATE 325 (65 FE) MG PO TABS
325.0000 mg | ORAL_TABLET | Freq: Two times a day (BID) | ORAL | Status: DC
  Administered 2014-04-23 – 2014-04-25 (×4): 325 mg via ORAL
  Filled 2014-04-23 (×6): qty 1

## 2014-04-23 MED ORDER — LISINOPRIL 10 MG PO TABS
10.0000 mg | ORAL_TABLET | Freq: Every day | ORAL | Status: DC
  Administered 2014-04-23 – 2014-04-25 (×2): 10 mg via ORAL
  Filled 2014-04-23 (×3): qty 1

## 2014-04-23 MED ORDER — ONDANSETRON HCL 8 MG PO TABS
8.0000 mg | ORAL_TABLET | Freq: Two times a day (BID) | ORAL | Status: DC
  Administered 2014-04-23 – 2014-04-24 (×3): 8 mg via ORAL
  Filled 2014-04-23: qty 1
  Filled 2014-04-23: qty 2
  Filled 2014-04-23 (×4): qty 1

## 2014-04-23 MED ORDER — ONDANSETRON HCL 4 MG/2ML IJ SOLN
INTRAMUSCULAR | Status: AC
  Filled 2014-04-23: qty 2

## 2014-04-23 MED ORDER — DOCUSATE SODIUM 100 MG PO CAPS
300.0000 mg | ORAL_CAPSULE | Freq: Every day | ORAL | Status: DC
  Administered 2014-04-24 – 2014-04-25 (×2): 300 mg via ORAL
  Filled 2014-04-23 (×3): qty 3

## 2014-04-23 MED ORDER — ROCURONIUM BROMIDE 100 MG/10ML IV SOLN
INTRAVENOUS | Status: DC | PRN
  Administered 2014-04-23: 50 mg via INTRAVENOUS

## 2014-04-23 MED ORDER — METHYLENE BLUE 1 % INJ SOLN
INTRAMUSCULAR | Status: AC
  Filled 2014-04-23: qty 10

## 2014-04-23 MED ORDER — GEMFIBROZIL 600 MG PO TABS
600.0000 mg | ORAL_TABLET | Freq: Two times a day (BID) | ORAL | Status: DC
  Administered 2014-04-23 – 2014-04-25 (×4): 600 mg via ORAL
  Filled 2014-04-23 (×6): qty 1

## 2014-04-23 MED ORDER — CEFAZOLIN SODIUM-DEXTROSE 2-3 GM-% IV SOLR
INTRAVENOUS | Status: AC
  Filled 2014-04-23: qty 50

## 2014-04-23 MED ORDER — BUPIVACAINE-EPINEPHRINE 0.25% -1:200000 IJ SOLN
INTRAMUSCULAR | Status: DC | PRN
  Administered 2014-04-23: 5 mL

## 2014-04-23 MED ORDER — SODIUM CHLORIDE 0.9 % IJ SOLN
INTRAMUSCULAR | Status: AC
  Filled 2014-04-23: qty 10

## 2014-04-23 MED ORDER — GABAPENTIN 300 MG PO CAPS
300.0000 mg | ORAL_CAPSULE | Freq: Every day | ORAL | Status: DC
  Administered 2014-04-23 – 2014-04-24 (×2): 300 mg via ORAL
  Filled 2014-04-23 (×3): qty 1

## 2014-04-23 MED ORDER — PANTOPRAZOLE SODIUM 40 MG PO TBEC
40.0000 mg | DELAYED_RELEASE_TABLET | Freq: Every day | ORAL | Status: DC
  Administered 2014-04-24 – 2014-04-25 (×2): 40 mg via ORAL
  Filled 2014-04-23 (×2): qty 1

## 2014-04-23 MED ORDER — MIDAZOLAM HCL 2 MG/2ML IJ SOLN
INTRAMUSCULAR | Status: AC
  Filled 2014-04-23: qty 2

## 2014-04-23 MED ORDER — ATENOLOL-CHLORTHALIDONE 50-25 MG PO TABS: 1.0000 | ORAL_TABLET | Freq: Every day | ORAL | Status: DC

## 2014-04-23 MED ORDER — ROCURONIUM BROMIDE 50 MG/5ML IV SOLN
INTRAVENOUS | Status: AC
  Filled 2014-04-23: qty 1

## 2014-04-23 MED ORDER — BUPIVACAINE-EPINEPHRINE (PF) 0.25% -1:200000 IJ SOLN
INTRAMUSCULAR | Status: AC
  Filled 2014-04-23: qty 30

## 2014-04-23 MED ORDER — LIDOCAINE HCL (CARDIAC) 20 MG/ML IV SOLN
INTRAVENOUS | Status: AC
  Filled 2014-04-23: qty 5

## 2014-04-23 MED ORDER — VITAMIN B-6 100 MG PO TABS
100.0000 mg | ORAL_TABLET | Freq: Every day | ORAL | Status: DC
  Administered 2014-04-23 – 2014-04-25 (×3): 100 mg via ORAL
  Filled 2014-04-23 (×3): qty 1

## 2014-04-23 MED ORDER — FENTANYL CITRATE 0.05 MG/ML IJ SOLN
50.0000 ug | Freq: Once | INTRAMUSCULAR | Status: AC
  Administered 2014-04-23: 50 ug via INTRAVENOUS

## 2014-04-23 MED ORDER — FENTANYL CITRATE 0.05 MG/ML IJ SOLN
25.0000 ug | INTRAMUSCULAR | Status: DC | PRN
  Administered 2014-04-23: 50 ug via INTRAVENOUS
  Administered 2014-04-23: 25 ug via INTRAVENOUS
  Administered 2014-04-23: 50 ug via INTRAVENOUS
  Administered 2014-04-23: 25 ug via INTRAVENOUS

## 2014-04-23 MED ORDER — MIDAZOLAM HCL 2 MG/2ML IJ SOLN
INTRAMUSCULAR | Status: AC
  Administered 2014-04-23: 1 mg via INTRAVENOUS
  Filled 2014-04-23: qty 2

## 2014-04-23 MED ORDER — MENTHOL 3 MG MT LOZG
1.0000 | LOZENGE | OROMUCOSAL | Status: DC | PRN
  Administered 2014-04-23 – 2014-04-24 (×2): 3 mg via ORAL
  Filled 2014-04-23 (×2): qty 9

## 2014-04-23 MED ORDER — TECHNETIUM TC 99M SULFUR COLLOID FILTERED
1.0000 | Freq: Once | INTRAVENOUS | Status: AC | PRN
  Administered 2014-04-23: 1 via INTRADERMAL

## 2014-04-23 MED ORDER — LIDOCAINE-PRILOCAINE 2.5-2.5 % EX CREA
1.0000 "application " | TOPICAL_CREAM | Freq: Once | CUTANEOUS | Status: DC
  Filled 2014-04-23: qty 5

## 2014-04-23 MED ORDER — BUPIVACAINE-EPINEPHRINE 0.5% -1:200000 IJ SOLN
INTRAMUSCULAR | Status: DC | PRN
  Administered 2014-04-23: 50 mL

## 2014-04-23 MED ORDER — LIDOCAINE HCL (PF) 1 % IJ SOLN
INTRAMUSCULAR | Status: AC
  Filled 2014-04-23: qty 30

## 2014-04-23 MED ORDER — FENTANYL CITRATE 0.05 MG/ML IJ SOLN
INTRAMUSCULAR | Status: AC
  Filled 2014-04-23: qty 2

## 2014-04-23 MED ORDER — MORPHINE SULFATE 2 MG/ML IJ SOLN
1.0000 mg | INTRAMUSCULAR | Status: DC | PRN
  Administered 2014-04-23 – 2014-04-25 (×8): 2 mg via INTRAVENOUS
  Filled 2014-04-23 (×8): qty 1

## 2014-04-23 MED ORDER — STERILE WATER FOR INJECTION IJ SOLN
INTRAMUSCULAR | Status: AC
  Filled 2014-04-23: qty 10

## 2014-04-23 MED ORDER — BUPIVACAINE-EPINEPHRINE (PF) 0.5% -1:200000 IJ SOLN
INTRAMUSCULAR | Status: AC
  Filled 2014-04-23: qty 60

## 2014-04-23 MED ORDER — ATENOLOL 50 MG PO TABS
50.0000 mg | ORAL_TABLET | Freq: Every day | ORAL | Status: DC
  Administered 2014-04-25: 50 mg via ORAL
  Filled 2014-04-23 (×2): qty 1

## 2014-04-23 MED ORDER — LORATADINE 10 MG PO CAPS
1.0000 | ORAL_CAPSULE | ORAL | Status: DC
  Filled 2014-04-23: qty 1

## 2014-04-23 MED ORDER — FENTANYL CITRATE 0.05 MG/ML IJ SOLN
INTRAMUSCULAR | Status: AC
Start: 2014-04-23 — End: 2014-04-23
  Administered 2014-04-23: 25 ug via INTRAVENOUS
  Filled 2014-04-23: qty 2

## 2014-04-23 MED ORDER — FENTANYL CITRATE 0.05 MG/ML IJ SOLN
INTRAMUSCULAR | Status: AC
  Administered 2014-04-23: 50 ug via INTRAVENOUS
  Filled 2014-04-23: qty 2

## 2014-04-23 MED ORDER — ADULT MULTIVITAMIN W/MINERALS CH
1.0000 | ORAL_TABLET | Freq: Every day | ORAL | Status: DC
  Administered 2014-04-24 – 2014-04-25 (×2): 1 via ORAL
  Filled 2014-04-23 (×3): qty 1

## 2014-04-23 MED ORDER — SODIUM CHLORIDE 0.9 % IJ SOLN
INTRAMUSCULAR | Status: DC | PRN
  Administered 2014-04-23: 5 mL

## 2014-04-23 MED ORDER — DEXAMETHASONE 4 MG PO TABS
8.0000 mg | ORAL_TABLET | Freq: Two times a day (BID) | ORAL | Status: DC
  Administered 2014-04-25: 8 mg via ORAL
  Filled 2014-04-23 (×7): qty 2

## 2014-04-23 MED ORDER — HEMOSTATIC AGENTS (NO CHARGE) OPTIME
TOPICAL | Status: DC | PRN
  Administered 2014-04-23: 1 via TOPICAL

## 2014-04-23 MED ORDER — CEFAZOLIN SODIUM-DEXTROSE 2-3 GM-% IV SOLR
INTRAVENOUS | Status: DC | PRN
  Administered 2014-04-23: 2 g via INTRAVENOUS

## 2014-04-23 MED ORDER — 0.9 % SODIUM CHLORIDE (POUR BTL) OPTIME
TOPICAL | Status: DC | PRN
  Administered 2014-04-23 (×2): 1000 mL

## 2014-04-23 MED ORDER — LACTATED RINGERS IV SOLN
INTRAVENOUS | Status: DC
  Administered 2014-04-23 (×3): via INTRAVENOUS

## 2014-04-23 MED ORDER — KCL IN DEXTROSE-NACL 20-5-0.45 MEQ/L-%-% IV SOLN
INTRAVENOUS | Status: DC
  Administered 2014-04-23 – 2014-04-25 (×5): via INTRAVENOUS
  Filled 2014-04-23 (×7): qty 1000

## 2014-04-23 MED ORDER — ACETAMINOPHEN 500 MG PO TABS
1000.0000 mg | ORAL_TABLET | Freq: Four times a day (QID) | ORAL | Status: AC
  Administered 2014-04-23 – 2014-04-24 (×4): 1000 mg via ORAL
  Filled 2014-04-23 (×5): qty 2

## 2014-04-23 MED ORDER — ALBUTEROL SULFATE HFA 108 (90 BASE) MCG/ACT IN AERS: 2.0000 | INHALATION_SPRAY | Freq: Four times a day (QID) | RESPIRATORY_TRACT | Status: DC | PRN

## 2014-04-23 MED ORDER — LORAZEPAM 0.5 MG PO TABS: 0.5000 mg | ORAL_TABLET | Freq: Every evening | ORAL | Status: DC | PRN

## 2014-04-23 MED ORDER — MIDAZOLAM HCL 2 MG/2ML IJ SOLN
1.0000 mg | Freq: Once | INTRAMUSCULAR | Status: AC
  Administered 2014-04-23: 1 mg via INTRAVENOUS

## 2014-04-23 MED ORDER — CEFAZOLIN SODIUM 1-5 GM-% IV SOLN
1.0000 g | Freq: Four times a day (QID) | INTRAVENOUS | Status: AC
  Administered 2014-04-23 – 2014-04-24 (×3): 1 g via INTRAVENOUS
  Filled 2014-04-23 (×4): qty 50

## 2014-04-23 MED ORDER — CHLORTHALIDONE 25 MG PO TABS
25.0000 mg | ORAL_TABLET | Freq: Every day | ORAL | Status: DC
  Administered 2014-04-24 – 2014-04-25 (×2): 25 mg via ORAL
  Filled 2014-04-23 (×3): qty 1

## 2014-04-23 MED ORDER — POTASSIUM CHLORIDE CRYS ER 20 MEQ PO TBCR: 20.0000 meq | EXTENDED_RELEASE_TABLET | Freq: Every day | ORAL | Status: DC

## 2014-04-23 MED ORDER — PHENYLEPHRINE HCL 10 MG/ML IJ SOLN
INTRAMUSCULAR | Status: DC | PRN
  Administered 2014-04-23 (×2): 80 ug via INTRAVENOUS
  Administered 2014-04-23: 160 ug via INTRAVENOUS
  Administered 2014-04-23 (×3): 80 ug via INTRAVENOUS

## 2014-04-23 MED ORDER — FENTANYL CITRATE 0.05 MG/ML IJ SOLN
INTRAMUSCULAR | Status: AC
  Filled 2014-04-23: qty 5

## 2014-04-23 MED ORDER — FENTANYL CITRATE 0.05 MG/ML IJ SOLN
INTRAMUSCULAR | Status: DC | PRN
  Administered 2014-04-23 (×5): 50 ug via INTRAVENOUS

## 2014-04-23 MED ORDER — ARTIFICIAL TEARS OP OINT
TOPICAL_OINTMENT | OPHTHALMIC | Status: AC
  Filled 2014-04-23: qty 3.5

## 2014-04-23 MED ORDER — PROCHLORPERAZINE MALEATE 10 MG PO TABS
10.0000 mg | ORAL_TABLET | Freq: Four times a day (QID) | ORAL | Status: DC | PRN
  Filled 2014-04-23: qty 1

## 2014-04-23 SURGICAL SUPPLY — 80 items
ADH SKN CLS APL DERMABOND .7 (GAUZE/BANDAGES/DRESSINGS) ×4
ATTRACTOMAT 16X20 MAGNETIC DRP (DRAPES) ×2 IMPLANT
BINDER BREAST LRG (GAUZE/BANDAGES/DRESSINGS) IMPLANT
BINDER BREAST XLRG (GAUZE/BANDAGES/DRESSINGS) IMPLANT
BINDER BREAST XXLRG (GAUZE/BANDAGES/DRESSINGS) ×2 IMPLANT
BNDG COHESIVE 4X5 TAN STRL (GAUZE/BANDAGES/DRESSINGS) ×4 IMPLANT
CANISTER SUCTION 2500CC (MISCELLANEOUS) ×8 IMPLANT
CHLORAPREP W/TINT 26ML (MISCELLANEOUS) ×4 IMPLANT
CLIP TI LARGE 6 (CLIP) ×4 IMPLANT
CLIP TI MEDIUM 24 (CLIP) ×2 IMPLANT
CLIP TI MEDIUM 6 (CLIP) ×4 IMPLANT
CLIP TI WIDE RED SMALL 6 (CLIP) ×4 IMPLANT
CLOSURE WOUND 1/2 X4 (GAUZE/BANDAGES/DRESSINGS) ×4
CONT SPEC 4OZ CLIKSEAL STRL BL (MISCELLANEOUS) ×10 IMPLANT
COVER PROBE W GEL 5X96 (DRAPES) ×2 IMPLANT
COVER SURGICAL LIGHT HANDLE (MISCELLANEOUS) ×4 IMPLANT
COVER TRANSDUCER ULTRASND GEL (DRAPE) ×4 IMPLANT
DERMABOND ADVANCED (GAUZE/BANDAGES/DRESSINGS) ×4
DERMABOND ADVANCED .7 DNX12 (GAUZE/BANDAGES/DRESSINGS) IMPLANT
DEVICE DISSECT PLASMABLAD 3.0S (MISCELLANEOUS) ×2 IMPLANT
DRAIN CHANNEL 19F RND (DRAIN) ×6 IMPLANT
DRAPE UTILITY 15X26 (DRAPE) ×2 IMPLANT
DRAPE UTILITY 15X26 W/TAPE STR (DRAPE) ×8 IMPLANT
DRSG PAD ABDOMINAL 8X10 ST (GAUZE/BANDAGES/DRESSINGS) ×2 IMPLANT
DRSG TEGADERM 4X4.75 (GAUZE/BANDAGES/DRESSINGS) ×2 IMPLANT
ELECT CAUTERY BLADE 6.4 (BLADE) ×6 IMPLANT
ELECT REM PT RETURN 9FT ADLT (ELECTROSURGICAL) ×8
ELECTRODE REM PT RTRN 9FT ADLT (ELECTROSURGICAL) ×4 IMPLANT
EVACUATOR SILICONE 100CC (DRAIN) ×6 IMPLANT
GAUZE SPONGE 4X4 16PLY XRAY LF (GAUZE/BANDAGES/DRESSINGS) ×2 IMPLANT
GLOVE BIO SURGEON STRL SZ 6 (GLOVE) ×4 IMPLANT
GLOVE BIOGEL PI IND STRL 6.5 (GLOVE) ×2 IMPLANT
GLOVE BIOGEL PI IND STRL 7.0 (GLOVE) IMPLANT
GLOVE BIOGEL PI INDICATOR 6.5 (GLOVE) ×2
GLOVE BIOGEL PI INDICATOR 7.0 (GLOVE) ×2
GLOVE SURG SS PI 7.0 STRL IVOR (GLOVE) ×2 IMPLANT
GOWN STRL REUS W/ TWL LRG LVL3 (GOWN DISPOSABLE) ×4 IMPLANT
GOWN STRL REUS W/TWL 2XL LVL3 (GOWN DISPOSABLE) ×4 IMPLANT
GOWN STRL REUS W/TWL LRG LVL3 (GOWN DISPOSABLE) ×8
HEMOSTAT SURGICEL 2X14 (HEMOSTASIS) ×2 IMPLANT
KIT BASIN OR (CUSTOM PROCEDURE TRAY) ×4 IMPLANT
KIT ROOM TURNOVER OR (KITS) ×4 IMPLANT
MARKER SKIN DUAL TIP RULER LAB (MISCELLANEOUS) ×4 IMPLANT
NDL 18GX1X1/2 (RX/OR ONLY) (NEEDLE) ×2 IMPLANT
NDL HYPO 25GX1X1/2 BEV (NEEDLE) ×2 IMPLANT
NDL SPNL 22GX3.5 QUINCKE BK (NEEDLE) ×2 IMPLANT
NEEDLE 18GX1X1/2 (RX/OR ONLY) (NEEDLE) ×4 IMPLANT
NEEDLE HYPO 25GX1X1/2 BEV (NEEDLE) ×8 IMPLANT
NEEDLE SPNL 22GX3.5 QUINCKE BK (NEEDLE) ×8 IMPLANT
NS IRRIG 1000ML POUR BTL (IV SOLUTION) ×4 IMPLANT
PACK GENERAL/GYN (CUSTOM PROCEDURE TRAY) ×4 IMPLANT
PACK UNIVERSAL I (CUSTOM PROCEDURE TRAY) ×4 IMPLANT
PAD ARMBOARD 7.5X6 YLW CONV (MISCELLANEOUS) ×4 IMPLANT
PLASMABLADE 3.0S (MISCELLANEOUS) ×8
SHEARS HARMONIC 9CM CVD (BLADE) ×2 IMPLANT
SPECIMEN JAR X LARGE (MISCELLANEOUS) ×4 IMPLANT
SPONGE GAUZE 4X4 12PLY STER LF (GAUZE/BANDAGES/DRESSINGS) ×4 IMPLANT
SPONGE INTESTINAL PEANUT (DISPOSABLE) ×2 IMPLANT
STAPLER VISISTAT 35W (STAPLE) ×4 IMPLANT
STOCKINETTE IMPERVIOUS 9X36 MD (GAUZE/BANDAGES/DRESSINGS) ×4 IMPLANT
STRIP CLOSURE SKIN 1/2X4 (GAUZE/BANDAGES/DRESSINGS) ×6 IMPLANT
SUT ETHILON 2 0 FS 18 (SUTURE) ×6 IMPLANT
SUT MNCRL AB 4-0 PS2 18 (SUTURE) ×2 IMPLANT
SUT MON AB 4-0 PC3 18 (SUTURE) ×4 IMPLANT
SUT SILK 2 0 (SUTURE) ×8
SUT SILK 2 0 FS (SUTURE) ×4 IMPLANT
SUT SILK 2-0 18XBRD TIE 12 (SUTURE) ×2 IMPLANT
SUT SILK 3 0 (SUTURE) ×4
SUT SILK 3-0 18XBRD TIE 12 (SUTURE) IMPLANT
SUT VIC AB 3-0 SH 27 (SUTURE) ×12
SUT VIC AB 3-0 SH 27X BRD (SUTURE) IMPLANT
SUT VIC AB 3-0 SH 8-18 (SUTURE) ×8 IMPLANT
SYR 50ML LL SCALE MARK (SYRINGE) ×4 IMPLANT
SYR 50ML SLIP (SYRINGE) ×2 IMPLANT
SYR CONTROL 10ML LL (SYRINGE) ×6 IMPLANT
TOWEL OR 17X24 6PK STRL BLUE (TOWEL DISPOSABLE) ×4 IMPLANT
TOWEL OR 17X26 10 PK STRL BLUE (TOWEL DISPOSABLE) ×4 IMPLANT
TRAY FOLEY CATH 16FRSI W/METER (SET/KITS/TRAYS/PACK) ×2 IMPLANT
TUBE CONNECTING 12'X1/4 (SUCTIONS) ×2
TUBE CONNECTING 12X1/4 (SUCTIONS) ×4 IMPLANT

## 2014-04-23 NOTE — Op Note (Signed)
Left Thyroid Lobectomy, Left total mastectomy, SLN bx, axillary lymph node dissection   Indications: Hot nodule on PET scan, needle biopsy is follicular lesion.    Pre-operative Diagnosis: follicular lesion of thyroid, left breast cancer cT2N2, IIIa, s/p neoadjuvant chemotherapy  Post-operative Diagnosis: Same  Surgeon:  [facility]  Assistants:  Christie Beckers, MD  Anesthesia: General endotracheal anesthesia  ASA Class: 3  Procedure Details  The patient was seen in the Holding Room. The risks, benefits, complications, treatment options, and expected outcomes were reiterated with the patient. The possibilities of reaction to medication, pulmonary aspiration,  bleeding,  finding a normal thyroid, recurrently laryngeal nerve damage, permanent hypocalcemia, the need for additional procedures, failure to diagnose a condition, and creating a complication requiring transfusion or operation were discussed with the patient. The patient concurred with the proposed plan, giving informed consent.  The site of surgery properly noted. The patient was taken to the Operating Room, identified, and the procedure verified as left thyroid lobectomy, left total mastectomy with SLN bx, possible ALND.  A Time Out was held and the above information confirmed.  The patient was placed supine after induction of a general anesthetic.  The breasts and shoulders were taped inferiorly due to the short neck.  The neck was extended and prepped and draped in standard fashion.  A 6 cm transverse cervical incision was created above the sternal notch within a natural skin fold.  The platysma was divided with the bovie.  Several veins were tied off with 2-0 silk ties.  Subplatysmal flaps were created with the cautery.    The strap muscles were identified and divided at the midline.  Sharp dissection was used to mobilize the left thyroid lobe in a medial direction.  Dissection continued posteriorly to expose the tracheoesophageal  groove and right carotid artery.  The recurrent laryngeal nerve was identified and preserved.  The thyroid lobe was mobilized further and the superior and inferior pole vessels were divided with the harmonic scalpel.  The middle thyroid vein was divided with the harmonic scalpel.  Small vessels were likewise divided.  The superior parathyroid was identified and preserved.  The gland was rotated in a medial direction and taken off the trachea using the cautery.  The isthmus was removed off the thyroid cartilage using the harmonic scalpel.   The cavity was irrigated.  Several small pieced of surgicel were placed into the wound.    The strap muscles were closed with running 3-0 Vicryl suture.  The platysma was closed with interrupted 3-0 Vicryl suture, and the skin incision was closed with a 4-0 Monocryl subcuticular closure.  Dermabond and Steri-Strips were applied across the incision.  The drapes were taken down.    The methylene blue was injected in the subareolar location.    The Left arm, breast, and chest were prepped and draped in standard fashion.   The borders of the breast were identified and marked.  The incisions of the breast was drawn out to make sure incision lines were equidistant in length.    The superior incision was made with the PlasmaBlade.  The marcaine/saline mixture was infiltrated into the superior flap.  Mastectomy hooks were used to provide elevation of the skin edges, and the PlasmaBlade was used to create the mastectomy flaps.  The dissection was taken to the fascia of the pectoralis major.  The penetrating vessels were clipped.  The superior flap was taken medially to the lateral sternal border, superiorly to the inferior border of the clavicle.  The inferior flap was similarly created, inferiorly to the inframammary fold and laterally to the border of the latissimus.  The breast was taken off including the pectoralis fascia and the axillary tail marked.    Using a hand-held gamma  probe, axillary sentinel nodes were identified.  3 level 2 axillary sentinel nodes were removed and submitted to pathology for touch prep.  The findings are below.  The lymphovascular channels were clipped with metal clips.        The wound was irrigated.Two 19 Blake drains were placed laterally.   Hemostasis was achieved with cautery.  The wound was irrigated.   The medial portion of the wound was closed while waiting for the frozen section with 3-0 Vicryl deep dermal interrupted sutures and 4-0 Vicryl subcuticular closure in layers.    The frozen section returned positive.  The axillary dissection was performed with removal of the associated lymph nodes and surrounding adipose tissue. This included levels I and II. This was accomplished by exposing the axillary vein anteriorly and inferiorly to the level of the pectoralis minor and laterally over the latissimus dorsi muscle. Posteriorly, the dissection continued to the subscapularis.  Small venous tributaries, lymphatics, and vessels were clipped and ligated or cauterized and divided. The subscapularis muscle was skeletonized. The long thoracic and thoracodorsal neurovascular bundles were identified and preserved.    The lateral drain was adjusted.  The wound was irrigated and the remainder closed with 3-0 Vicryl deep dermal interrupted and a 4-0 vicryl subcuticular closure in layers.    Sterile dressings were applied. At the end of the operation, all sponge, instrument, and needle counts were correct.  Instrument, sponge, and needle counts were correct prior to closure and at the conclusion of the case.   Findings: The left recurrent laryngeal nerve was identified.  Parathyroid tissue was identified and preserved with a viable blood supply.  The left thyroid had a firm nodule present.  Three SLN found, #1 2000, #2 140, #3 20, background 3 cps.    Estimated Blood Loss:  less than 50 mL              Specimens: left thyroid, left mastectomy, left  SLN 1,2,3, axillary contents.           Complications:  None; patient tolerated the procedure well.         Disposition: PACU - hemodynamically stable.         Condition: stable

## 2014-04-23 NOTE — Progress Notes (Signed)
Utilization Review Completed.Tru Leopard T11/19/2015  

## 2014-04-23 NOTE — Plan of Care (Signed)
Problem: Phase I Progression Outcomes Goal: Tubes/drains patent Outcome: Completed/Met Date Met:  04/23/14     

## 2014-04-23 NOTE — H&P (Signed)
Shelly Hall 03/30/2014 4:23 PM Location: [facility] Patient #: [id number] DOB: 02/18/1971 Single / Language: English / Race: Black or African American Female  History of Present Illness (Faera Byerly MD; 04/06/2014 11:26 AM) The patient is a 43 year old female who presents with breast cancer. 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 Tumor markers include estrogen receptor positive, progesterone receptor positive, HER-2/neu negative, BRCA -1 negative and BRCA -2 negative. Disease involvement includes ipsilateral axillary lymph nodes. The patient was referred by a specialty consultant (Dr. Susan Turner). Initial presentation was 6 month(s) ago for breast mass on self-examination. Current diagnosis was determined by mammography, breast MRI and core needle biopsy. Treatment has included chemotherapy (neoadjuvant). Symptoms include hair loss, while symptoms do not include breast pain, nipple discharge, fatigue, nausea, vomiting, edema of the arm or shortness of breath. Risk factors include breast cancer in a second degree relative (bone cancer, stomach cancer, unknown primary), while risk factors do not include radiation exposure, menarche before 12 years, menopause after 60 years, nulliparity, BRCA 1 positivity or BRCA 2 positivity. MRI shows dramatic improvement in tumor and resolution of lymphadenopathy.    Review of Systems (Faera Byerly MD; 04/06/2014 11:15 AM) General Not Present- Appetite Loss, Chills, Fatigue, Fever, Night Sweats, Weight Gain and Weight Loss. Skin Not Present- Change in Wart/Mole, Dryness, Hives, Jaundice, New Lesions, Non-Healing Wounds, Rash and Ulcer. HEENT Not Present- Earache, Hearing Loss, Hoarseness, Nose Bleed, Oral Ulcers, Ringing in the Ears, Seasonal Allergies, Sinus Pain, Sore Throat, Visual Disturbances, Wears glasses/contact lenses and Yellow  Eyes. Respiratory Not Present- Bloody sputum, Chronic Cough, Difficulty Breathing, Snoring and Wheezing. Breast Not Present- Breast Mass, Breast Pain, Nipple Discharge and Skin Changes. Cardiovascular Not Present- Chest Pain, Difficulty Breathing Lying Down, Leg Cramps, Palpitations, Rapid Heart Rate, Shortness of Breath and Swelling of Extremities. Gastrointestinal Not Present- Abdominal Pain, Bloating, Bloody Stool, Change in Bowel Habits, Chronic diarrhea, Constipation, Difficulty Swallowing, Excessive gas, Gets full quickly at meals, Hemorrhoids, Indigestion, Nausea, Rectal Pain and Vomiting. Female Genitourinary Not Present- Frequency, Nocturia, Painful Urination, Pelvic Pain and Urgency. Musculoskeletal Not Present- Back Pain, Joint Pain, Joint Stiffness, Muscle Pain, Muscle Weakness and Swelling of Extremities. Neurological Not Present- Decreased Memory, Fainting, Headaches, Numbness, Seizures, Tingling, Tremor, Trouble walking and Weakness. Psychiatric Not Present- Anxiety, Bipolar, Change in Sleep Pattern, Depression, Fearful and Frequent crying. Endocrine Not Present- Cold Intolerance, Excessive Hunger, Hair Changes, Heat Intolerance, Hot flashes and New Diabetes. Hematology Not Present- Easy Bruising, Excessive bleeding, Gland problems, HIV and Persistent Infections.   Physical Exam (Faera Byerly MD; 04/06/2014 11:20 AM) General Mental Status-Alert. General Appearance-Consistent with stated age. Hydration-Well hydrated. Voice-Normal.  Head and Neck Head-normocephalic, atraumatic with no lesions or palpable masses.  Eye Sclera/Conjunctiva - Bilateral-No scleral icterus.  Chest and Lung Exam Chest and lung exam reveals -quiet, even and easy respiratory effort with no use of accessory muscles. Inspection Chest Wall - Normal. Back - normal.  Breast Note: no palpable nodes or masses in the left breast or axilla now. right breast negative for masses. no skin  dimpling, nipple retraction or nipple discharge in either breast/   Cardiovascular Cardiovascular examination reveals -normal pedal pulses bilaterally. Note: regular rate and rhythm  Abdomen Inspection-Inspection Normal. Palpation/Percussion Palpation and Percussion of the abdomen reveal - Soft, Non Tender, No Rebound tenderness, No Rigidity (guarding) and No hepatosplenomegaly. Auscultation Auscultation of the abdomen reveals -   Bowel sounds normal.  Peripheral Vascular Upper Extremity Inspection - Bilateral - Normal - No Clubbing, No Cyanosis, No Edema, Pulses Intact. Lower Extremity Palpation - Edema - Bilateral - No edema.  Neurologic Neurologic evaluation reveals -alert and oriented x 3 with no impairment of recent or remote memory. Mental Status-Normal.  Musculoskeletal Global Assessment -Note: no gross deformities.  Normal Exam - Left-Upper Extremity Strength Normal and Lower Extremity Strength Normal. Normal Exam - Right-Upper Extremity Strength Normal and Lower Extremity Strength Normal.  Lymphatic Head & Neck  General Head & Neck Lymphatics: Bilateral - Description - Normal. Axillary  General Axillary Region: Bilateral - Description - Normal. Tenderness - Non Tender.    Assessment & Plan (Faera Byerly MD; 04/06/2014 11:25 AM) PRIMARY CANCER OF UPPER OUTER QUADRANT OF LEFT FEMALE BREAST (174.4  C50.412) Impression: Pt does not want to attempt breast conservation given how large her original tumor was. 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 We will plan for a left mastectomy with SLN bx and possible ALND, left thyroid lobectomy. She is not a candidate for up front reconstruction since she will definitely need radiation. We reviewed the risks of surgery including bleeding, infection, dislike of the incision, lymphedema, recurrent cancer, other wound complications, heart or lung complications,  or other. I discussed risks of hypocalcemia, nerve damage, possible need for additional procedures regarding thyroid surgery. She was given educational materials.  I reviewed the decision for SLN bx with Dr. Magrinat.  We will get her on the schedule. Current Plans  Instructions: will plan mastectomy wtih SLN biopsy and possible axillary lymph node dissection.  also left thyroid lobectomy.  STOP SMOKING :)   Signed by Faera Byerly, MD (04/06/2014 11:26 AM) 

## 2014-04-23 NOTE — Interval H&P Note (Signed)
History and Physical Interval Note:  04/23/2014 9:18 AM  Shelly Hall  has presented today for surgery, with the diagnosis of left thyroid follicular nodule, left breast cancer  The various methods of treatment have been discussed with the patient and family. After consideration of risks, benefits and other options for treatment, the patient has consented to  Procedure(s): MASTECTOMY WITH SENTINEL LYMPH NODE BIOPSY AND POSSIBLE LYMPH NODE DISECTION (Left) LEFT THYROID LOBECTOMY (Left) as a surgical intervention .  The patient's history has been reviewed, patient examined, no change in status, stable for surgery.  I have reviewed the patient's chart and labs.  Questions were answered to the patient's satisfaction.     [name]

## 2014-04-23 NOTE — Transfer of Care (Signed)
Immediate Anesthesia Transfer of Care Note  Patient: Shelly Hall  Procedure(s) Performed: Procedure(s): LEFT BREAST MASTECTOMY WITH SENTINEL LYMPH NODE BIOPSY AND AXILLARY LYMPH NODE DISECTION (Left) LEFT THYROID LOBECTOMY (Left)  Patient Location: PACU  Anesthesia Type:General  Level of Consciousness: awake and oriented  Airway & Oxygen Therapy: Patient Spontanous Breathing and Patient connected to nasal cannula oxygen  Post-op Assessment: Report given to PACU RN  Post vital signs: Reviewed and stable  Complications: No apparent anesthesia complications

## 2014-04-23 NOTE — Anesthesia Preprocedure Evaluation (Addendum)
Anesthesia Evaluation  Patient identified by MRN, date of birth, ID band Patient awake    Reviewed: Allergy & Precautions, H&P , NPO status , Patient's Chart, lab work & pertinent test results, reviewed documented beta blocker date and time   History of Anesthesia Complications History of anesthetic complications: anxious when awakening, good candidate for precedex.  Airway Mallampati: I  TM Distance: >3 FB Neck ROM: Full    Dental  (+) Teeth Intact, Dental Advisory Given   Pulmonary Current Smoker,          Cardiovascular Exercise Tolerance: Good hypertension, Pt. on medications Rhythm:Regular Rate:Normal  ECHO 12/2013 55% EF   Neuro/Psych Anxiety Depression    GI/Hepatic GERD-  Medicated,  Endo/Other  diabetes, Poorly Controlled, Type 2, Oral Hypoglycemic Agents  Renal/GU      Musculoskeletal   Abdominal   Peds  Hematology   Anesthesia Other Findings   Reproductive/Obstetrics                            Anesthesia Physical Anesthesia Plan  ASA: III  Anesthesia Plan: General   Post-op Pain Management:    Induction: Intravenous  Airway Management Planned: Oral ETT  Additional Equipment:   Intra-op Plan:   Post-operative Plan: Extubation in OR  Informed Consent: I have reviewed the patients History and Physical, chart, labs and discussed the procedure including the risks, benefits and alternatives for the proposed anesthesia with the patient or authorized representative who has indicated his/her understanding and acceptance.     Plan Discussed with:   Anesthesia Plan Comments: (Good candidate for 30-58mcg of precedex as she has hx of anxiety when awakening from anesthesia)        Anesthesia Quick Evaluation

## 2014-04-23 NOTE — Anesthesia Postprocedure Evaluation (Signed)
  Anesthesia Post-op Note  Patient: Shelly Hall  Procedure(s) Performed: Procedure(s): LEFT BREAST MASTECTOMY WITH SENTINEL LYMPH NODE BIOPSY AND AXILLARY LYMPH NODE DISECTION (Left) LEFT THYROID LOBECTOMY (Left)  Patient Location: PACU  Anesthesia Type:General  Level of Consciousness: awake  Airway and Oxygen Therapy: Patient Spontanous Breathing and Patient connected to nasal cannula oxygen  Post-op Pain: moderate  Post-op Assessment: Post-op Vital signs reviewed, Patient's Cardiovascular Status Stable, Respiratory Function Stable and Patent Airway  Post-op Vital Signs: Reviewed  Last Vitals:  Filed Vitals:   04/23/14 1329  BP: 132/89  Pulse: 80  Temp: 36.8 C  Resp: 15    Complications: No apparent anesthesia complications

## 2014-04-23 NOTE — Progress Notes (Signed)
Almyra Free, CRNA at bedside, pt to OR.

## 2014-04-23 NOTE — Anesthesia Procedure Notes (Signed)
Procedure Name: Intubation Date/Time: 04/23/2014 9:37 AM Performed by: Barrington Ellison Pre-anesthesia Checklist: Patient identified, Emergency Drugs available, Suction available, Patient being monitored and Timeout performed Patient Re-evaluated:Patient Re-evaluated prior to inductionOxygen Delivery Method: Circle system utilized Preoxygenation: Pre-oxygenation with 100% oxygen Intubation Type: IV induction Ventilation: Mask ventilation without difficulty Laryngoscope Size: 3 and Mac Grade View: Grade I Tube type: Oral Tube size: 7.0 mm Number of attempts: 1 Airway Equipment and Method: Stylet Placement Confirmation: ETT inserted through vocal cords under direct vision,  positive ETCO2 and breath sounds checked- equal and bilateral Secured at: 22 cm Tube secured with: Tape Dental Injury: Teeth and Oropharynx as per pre-operative assessment

## 2014-04-24 ENCOUNTER — Encounter (HOSPITAL_COMMUNITY): Payer: Self-pay | Admitting: General Surgery

## 2014-04-24 DIAGNOSIS — Z170 Estrogen receptor positive status [ER+]: Secondary | ICD-10-CM | POA: Diagnosis not present

## 2014-04-24 DIAGNOSIS — C773 Secondary and unspecified malignant neoplasm of axilla and upper limb lymph nodes: Secondary | ICD-10-CM | POA: Diagnosis present

## 2014-04-24 DIAGNOSIS — Z79899 Other long term (current) drug therapy: Secondary | ICD-10-CM | POA: Diagnosis not present

## 2014-04-24 DIAGNOSIS — Z9221 Personal history of antineoplastic chemotherapy: Secondary | ICD-10-CM | POA: Diagnosis not present

## 2014-04-24 DIAGNOSIS — E041 Nontoxic single thyroid nodule: Secondary | ICD-10-CM | POA: Diagnosis present

## 2014-04-24 DIAGNOSIS — C50412 Malignant neoplasm of upper-outer quadrant of left female breast: Secondary | ICD-10-CM | POA: Diagnosis present

## 2014-04-24 DIAGNOSIS — C50912 Malignant neoplasm of unspecified site of left female breast: Secondary | ICD-10-CM | POA: Diagnosis present

## 2014-04-24 LAB — CBC
HCT: 33.1 % — ABNORMAL LOW (ref 36.0–46.0)
Hemoglobin: 10.9 g/dL — ABNORMAL LOW (ref 12.0–15.0)
MCH: 31 pg (ref 26.0–34.0)
MCHC: 32.9 g/dL (ref 30.0–36.0)
MCV: 94 fL (ref 78.0–100.0)
Platelets: 205 10*3/uL (ref 150–400)
RBC: 3.52 MIL/uL — ABNORMAL LOW (ref 3.87–5.11)
RDW: 14.1 % (ref 11.5–15.5)
WBC: 5.2 10*3/uL (ref 4.0–10.5)

## 2014-04-24 LAB — BASIC METABOLIC PANEL
Anion gap: 13 (ref 5–15)
BUN: 9 mg/dL (ref 6–23)
CO2: 24 mEq/L (ref 19–32)
Calcium: 8.7 mg/dL (ref 8.4–10.5)
Chloride: 101 mEq/L (ref 96–112)
Creatinine, Ser: 0.94 mg/dL (ref 0.50–1.10)
GFR, EST AFRICAN AMERICAN: 84 mL/min — AB (ref >90)
GFR, EST NON AFRICAN AMERICAN: 72 mL/min — AB (ref >90)
Glucose, Bld: 125 mg/dL — ABNORMAL HIGH (ref 70–99)
POTASSIUM: 4.3 meq/L (ref 3.7–5.3)
SODIUM: 138 meq/L (ref 137–147)

## 2014-04-24 LAB — GLUCOSE, CAPILLARY
GLUCOSE-CAPILLARY: 141 mg/dL — AB (ref 70–99)
GLUCOSE-CAPILLARY: 138 mg/dL — AB (ref 70–99)

## 2014-04-24 NOTE — Progress Notes (Signed)
1 Day Post-Op  Subjective: Pt is very sore this AM.  She has a sore throat especially.  She denies dizziness or nausea.    Objective: Vital signs in last 24 hours: Temp:  [98 F (36.7 C)-99.2 F (37.3 C)] 98.2 F (36.8 C) (11/20 0512) Pulse Rate:  [65-94] 85 (11/20 0512) Resp:  [12-20] 16 (11/20 0512) BP: (87-145)/(48-107) 100/65 mmHg (11/20 0512) SpO2:  [92 %-100 %] 92 % (11/20 0512) Weight:  [186 lb (84.369 kg)-188 lb 4.8 oz (85.412 kg)] 188 lb 4.8 oz (85.412 kg) (11/19 1539) Last BM Date: 04/22/14  Intake/Output from previous day: 11/19 0701 - 11/20 0700 In: 3000 [I.V.:2900; IV Piggyback:100] Out: 3490 [Urine:3250; Drains:190; Blood:50] Intake/Output this shift:    General appearance: fatigued and looks very weak Neck: neck soft, voice hoarse, but able to say E appropriately Chest wall: anticipated left sided tenderness.  no hematoma.  drains serosanguinous  Lab Results:   Recent Labs  04/24/14 0610  WBC 5.2  HGB 10.9*  HCT 33.1*  PLT 205   BMET  Recent Labs  04/24/14 0610  NA 138  K 4.3  CL 101  CO2 24  GLUCOSE 125*  BUN 9  CREATININE 0.94  CALCIUM 8.7   PT/INR No results for input(s): LABPROT, INR in the last 72 hours. ABG No results for input(s): PHART, HCO3 in the last 72 hours.  Invalid input(s): PCO2, PO2  Studies/Results: Nm Sentinel Node Inj-no Rpt (breast)  04/23/2014   CLINICAL DATA: left breast cancer   Sulfur colloid was injected intradermally by the nuclear medicine  technologist for breast cancer sentinel node localization.     Anti-infectives: Anti-infectives    Start     Dose/Rate Route Frequency Ordered Stop   04/23/14 2200  acyclovir (ZOVIRAX) tablet 400 mg     400 mg Oral 2 times daily 04/23/14 1540     04/23/14 1600  ceFAZolin (ANCEF) IVPB 1 g/50 mL premix     1 g100 mL/hr over 30 Minutes Intravenous Every 6 hours 04/23/14 1540 04/24/14 0629      Assessment/Plan: s/p Procedure(s): LEFT BREAST MASTECTOMY WITH SENTINEL  LYMPH NODE BIOPSY AND AXILLARY LYMPH NODE DISECTION (Left) LEFT THYROID LOBECTOMY (Left) Plan for discharge tomorrow Pt too uncomfortable to go home today  Also still fairly unsteady  LOS: 1 day    [facility] 04/24/2014

## 2014-04-24 NOTE — Plan of Care (Signed)
Problem: Phase I Progression Outcomes Goal: Voiding-avoid urinary catheter unless indicated Outcome: Completed/Met Date Met:  04/24/14

## 2014-04-25 LAB — BASIC METABOLIC PANEL
Anion gap: 14 (ref 5–15)
BUN: 10 mg/dL (ref 6–23)
CO2: 25 mEq/L (ref 19–32)
Calcium: 9.1 mg/dL (ref 8.4–10.5)
Chloride: 101 mEq/L (ref 96–112)
Creatinine, Ser: 1.1 mg/dL (ref 0.50–1.10)
GFR calc Af Amer: 69 mL/min — ABNORMAL LOW (ref >90)
GFR, EST NON AFRICAN AMERICAN: 60 mL/min — AB (ref >90)
Glucose, Bld: 148 mg/dL — ABNORMAL HIGH (ref 70–99)
POTASSIUM: 4.7 meq/L (ref 3.7–5.3)
SODIUM: 140 meq/L (ref 137–147)

## 2014-04-25 LAB — CBC
HCT: 32.9 % — ABNORMAL LOW (ref 36.0–46.0)
Hemoglobin: 10.5 g/dL — ABNORMAL LOW (ref 12.0–15.0)
MCH: 29.9 pg (ref 26.0–34.0)
MCHC: 31.9 g/dL (ref 30.0–36.0)
MCV: 93.7 fL (ref 78.0–100.0)
Platelets: 214 10*3/uL (ref 150–400)
RBC: 3.51 MIL/uL — ABNORMAL LOW (ref 3.87–5.11)
RDW: 14.1 % (ref 11.5–15.5)
WBC: 7 10*3/uL (ref 4.0–10.5)

## 2014-04-25 MED ORDER — OXYCODONE HCL 5 MG PO TABS: 5.0000 mg | ORAL_TABLET | ORAL | Status: DC | PRN

## 2014-04-25 NOTE — Progress Notes (Signed)
SN went over discharge instructions with patient. Home medications were gone over. Prescription given. SN watched mother empty JP drains. Demonstrated proper technique in emptying drains.  Diet , activity, and incisional care gone over. Reasons to call the doctor gone over. Follow up appointment to be made. My chart discussed. Patient and patient's mother verbalized understanding of instructions.

## 2014-04-25 NOTE — Discharge Instructions (Signed)
[facility] [place] °[phone] ° °MASTECTOMY: POST OP INSTRUCTIONS ° °Always review your discharge instruction sheet given to you by the facility where your surgery was performed. °IF YOU HAVE DISABILITY OR FAMILY LEAVE FORMS, YOU MUST BRING THEM TO THE OFFICE FOR PROCESSING.   °DO NOT GIVE THEM TO YOUR DOCTOR. °A prescription for pain medication may be given to you upon discharge.  Take your pain medication as prescribed, if needed.  If narcotic pain medicine is not needed, then you may take acetaminophen (Tylenol) or ibuprofen (Advil) as needed. °1. Take your usually prescribed medications unless otherwise directed. °2. If you need a refill on your pain medication, please contact your pharmacy.  They will contact our office to request authorization.  Prescriptions will not be filled after 5pm or on week-ends. °3. You should follow a light diet the first few days after arrival home, such as soup and crackers, etc.  Resume your normal diet the day after surgery. °4. Most patients will experience some swelling and bruising on the chest and underarm.  Ice packs will help.  Swelling and bruising can take several days to resolve.  °5. It is common to experience some constipation if taking pain medication after surgery.  Increasing fluid intake and taking a stool softener (such as Colace) will usually help or prevent this problem from occurring.  A mild laxative (Milk of Magnesia or Miralax) should be taken according to package instructions if there are no bowel movements after 48 hours. °6. Unless discharge instructions indicate otherwise, leave your bandage dry and in place until your next appointment in 3-5 days.  You may take a limited sponge bath.  No tube baths or showers until the drains are removed.  You may have steri-strips (small skin tapes) in place directly over the incision.  These strips should be left on the skin for 7-10 days.  If your surgeon used skin glue on the incision, you may  shower in 24 hours.  The glue will flake off over the next 2-3 weeks.  Any sutures or staples will be removed at the office during your follow-up visit. °7. DRAINS:  If you have drains in place, it is important to keep a list of the amount of drainage produced each day in your drains.  Before leaving the hospital, you should be instructed on drain care.  Call our office if you have any questions about your drains. °8. ACTIVITIES:  You may resume regular (light) daily activities beginning the next day--such as daily self-care, walking, climbing stairs--gradually increasing activities as tolerated.  You may have sexual intercourse when it is comfortable.  Refrain from any heavy lifting or straining until approved by your doctor. °a. You may drive when you are no longer taking prescription pain medication, you can comfortably wear a seatbelt, and you can safely maneuver your car and apply brakes. °b. RETURN TO WORK:  __________________________________________________________ °9. You should see your doctor in the office for a follow-up appointment approximately 3-5 days after your surgery.  Your doctor’s nurse will typically make your follow-up appointment when she calls you with your pathology report.  Expect your pathology report 2-3 business days after your surgery.  You may call to check if you do not hear from us after three days.   °10. OTHER INSTRUCTIONS: ______________________________________________________________________________________________ ____________________________________________________________________________________________ °WHEN TO CALL YOUR DOCTOR: °1. Fever over 101.0 °2. Nausea and/or vomiting °3. Extreme swelling or bruising °4. Continued bleeding from incision. °5. Increased pain, redness, or drainage from the incision. °  The clinic staff is available to answer your questions during regular business hours.  Please dont hesitate to call and ask to speak to one of the nurses for clinical  concerns.  If you have a medical emergency, go to the nearest emergency room or call 911.  A surgeon from [facility] is always on call at the hospital. [address] [place], [place], [place]  [address] ? [address], [place], [place]   [address] [phone] ? [phone] ? FAX [phone]   [facility], [place] [phone]  THYROID/ PARATHYROID SURGERY: POST OP INSTRUCTIONS  Always review your discharge instruction sheet given to you by the facility where your surgery was performed.  IF YOU HAVE DISABILITY OR FAMILY LEAVE FORMS, YOU MUST BRING THEM TO THE OFFICE FOR PROCESSING.  PLEASE DO NOT GIVE THEM TO YOUR DOCTOR.  1. A prescription for pain medication may be given to you upon discharge.  Take your pain medication as prescribed, if needed.  If narcotic pain medicine is not needed, then you may take acetaminophen (Tylenol) or ibuprofen (Advil) as needed. 2. Take your usually prescribed medications unless otherwise directed. 3. If you need a refill on your pain medication, please contact your pharmacy. They will contact our office to request authorization.  Prescriptions will not be filled after 5pm or on week-ends. 4. You should follow a light diet the first 24 hours after arrival home, such as soup and crackers, etc.  Be sure to include lots of fluids daily.  Resume your normal diet the day after surgery. 5. Most patients will experience some swelling and bruising on the chest and neck area.  Ice packs will help.  Swelling and bruising can take several days to resolve.  6. It is common to experience some constipation if taking pain medication after surgery.  Increasing fluid intake and taking a stool softener will usually help or prevent this problem from occurring.  A mild laxative (Milk of Magnesia or Miralax) should be taken according to package directions if there are no bowel movements after 48 hours. 7. Unless discharge instructions  indicate otherwise, you may remove your bandages 24-48 hours after surgery, and you may shower at that time.  You may have steri-strips (small skin tapes) in place directly over the incision.  These strips should be left on the skin for 7-10 days.  If your surgeon used skin glue on the incision, you may shower in 24 hours.  The glue will flake off over the next 2-3 weeks.  Any sutures or staples will be removed at the office during your follow-up visit. 8. ACTIVITIES:  You may resume regular (light) daily activities beginning the next day--such as daily self-care, walking, climbing stairs--gradually increasing activities as tolerated.  You may have sexual intercourse when it is comfortable.  Refrain from any heavy lifting or straining until approved by your doctor. a. You may drive when you no longer are taking prescription pain medication, you can comfortably wear a seatbelt, and you can safely maneuver your car and apply brakes b. RETURN TO WORK:  __________________________________________________________ 9. You should see your doctor in the office for a follow-up appointment approximately two weeks after your surgery.  Make sure that you call for this appointment within a day or two after you arrive home to insure a convenient appointment time. 10. OTHER INSTRUCTIONS: ____________________________________________________________________________ _________________________________________________________________________________________________________________ _________________________________________________________________________________________________________________   WHEN TO CALL YOUR DOCTOR: 6. Fever over 101.0 7. Inability to  urinate 8. Nausea and/or vomiting 9. Extreme swelling or bruising 10. Continued bleeding from incision. 11. Increased pain, redness, or drainage from the incision. 12. Difficulty swallowing or breathing 13. Muscle cramping or spasms. 14. Numbness or tingling in hands or  feet or around lips.  The clinic staff is available to answer your questions during regular business hours.  Please dont hesitate to call and ask to speak to one of the nurses if you have concerns.  For further questions, please visit [email or web address]

## 2014-04-25 NOTE — Plan of Care (Signed)
Problem: Discharge Progression Outcomes Goal: Discharge plan in place and appropriate Outcome: Completed/Met Date Met:  04/25/14 Goal: Pain controlled with appropriate interventions Outcome: Completed/Met Date Met:  04/25/14 Goal: Hemodynamically stable Outcome: Completed/Met Date Met:  63/33/54 Goal: Complications resolved/controlled Outcome: Completed/Met Date Met:  04/25/14 Goal: Tolerating diet Outcome: Completed/Met Date Met:  04/25/14 Goal: Activity appropriate for discharge plan Outcome: Completed/Met Date Met:  04/25/14 Goal: Tubes and drains discontinued if indicated Outcome: Not Applicable Date Met:  56/25/63 Goal: Staples/sutures removed Outcome: Not Applicable Date Met:  89/37/34 Goal: Steri-Strips applied Outcome: Completed/Met Date Met:  04/25/14

## 2014-04-25 NOTE — Discharge Summary (Signed)
Physician Discharge Summary  Patient ID: Shelly Hall MRN: [id number] DOB/AGE: January 28, 1971 43 y.o.  Admit date: 04/23/2014 Discharge date: 04/25/2014  Admission Diagnoses: Left breast cancer Left follicular thyroid lesion  Discharge Diagnoses:  Active Problems:   Breast cancer thyroid nodule  Discharged Condition: stable  Hospital Course:  Pt was admitted to the floor following left thyroid lobectomy, left mastectomy with sentinel lymph node biopsy followed by axillary lymph node dissection.  She was very sore the first morning, and did not have good pain control without IV medication.  She was mobilized more, and she was then able to progress much more quickly.  She was doing better on POD 2 with better pain control with oral narcotics.  She had drain teaching, and her mother demonstrated ability to do drains.  She was able to void spontaneously and ambulate independently.  She was discharged to home in stable condition.    Consults: None  Significant Diagnostic Studies: labs: Calcium 9.1, HCT 32.9, Cr 1.1  Treatments: surgery: see above   Discharge Exam: Blood pressure 103/60, pulse 90, temperature 99.2 F (37.3 C), temperature source Oral, resp. rate 18, height 5\' 3"  (1.6 m), weight 188 lb 4.8 oz (85.412 kg), last menstrual period 04/16/2014, SpO2 96 %. General appearance: alert, cooperative and mild distress Neck: neck soft without hematoma.  appropriately tender Resp: breathing comfortably Chest wall: left sided chest wall tenderness as expected.  Drains serosanguinous.    Disposition: 01-Home or Self Care  Discharge Instructions    Call MD for:  difficulty breathing, headache or visual disturbances    Complete by:  As directed      Call MD for:  persistant nausea and vomiting    Complete by:  As directed      Call MD for:  redness, tenderness, or signs of infection (pain, swelling, redness, odor or green/yellow discharge around incision site)    Complete by:  As  directed      Call MD for:  severe uncontrolled pain    Complete by:  As directed      Call MD for:  temperature >100.4    Complete by:  As directed      Change dressing (specify)    Complete by:  As directed   Measure and record drain output BID.  Bring record to clinic.     Diet - low sodium heart healthy    Complete by:  As directed      Increase activity slowly    Complete by:  As directed             Medication List    TAKE these medications        acyclovir 400 MG tablet  Commonly known as:  ZOVIRAX  Take 1 tablet (400 mg total) by mouth 2 (two) times daily. Beginning 11/12/2013     albuterol 108 (90 BASE) MCG/ACT inhaler  Commonly known as:  PROVENTIL HFA;VENTOLIN HFA  Inhale 2 puffs into the lungs every 6 (six) hours as needed for wheezing or shortness of breath.     atenolol-chlorthalidone 50-25 MG per tablet  Commonly known as:  TENORETIC  Take 1 tablet by mouth daily.     dexamethasone 4 MG tablet  Commonly known as:  DECADRON  Take 2 tablets (8 mg total) by mouth 2 (two) times daily with a meal. Start the day after chemotherapy for 2 days. Take with food.     docusate sodium 100 MG capsule  Commonly known as:  COLACE  Take 300 mg by mouth daily.     ferrous sulfate 325 (65 FE) MG tablet  Take 325 mg by mouth 2 (two) times daily with a meal.     gabapentin 300 MG capsule  Commonly known as:  NEURONTIN  Take 1 capsule (300 mg total) by mouth at bedtime.     gemfibrozil 600 MG tablet  Commonly known as:  LOPID  Take 600 mg by mouth 2 (two) times daily before a meal.     lidocaine-prilocaine cream  Commonly known as:  EMLA  Apply 1 application topically as needed. Apply over port area 1-2 hours before chemo and cover with plastic wrap     lisinopril 10 MG tablet  Commonly known as:  PRINIVIL,ZESTRIL  Take 10 mg by mouth daily.     Loratadine 10 MG Caps  Take 1 capsule (10 mg total) by mouth as directed.     LORazepam 0.5 MG tablet  Commonly known  as:  ATIVAN  Take 1 tablet (0.5 mg total) by mouth at bedtime as needed (Nausea or vomiting).     metFORMIN 500 MG tablet  Commonly known as:  GLUCOPHAGE  Take 1 tablet (500 mg total) by mouth 2 (two) times daily with a meal.     multivitamin with minerals Tabs tablet  Take 1 tablet by mouth daily.     ondansetron 8 MG tablet  Commonly known as:  ZOFRAN  Take 1 tablet (8 mg total) by mouth 2 (two) times daily. Start the day after chemo for 2 days. Then take as needed for nausea or vomiting.     oxyCODONE 5 MG immediate release tablet  Commonly known as:  Oxy IR/ROXICODONE  Take 1-2 tablets (5-10 mg total) by mouth every 4 (four) hours as needed for severe pain.     pantoprazole 40 MG tablet  Commonly known as:  PROTONIX  Take 1 tablet (40 mg total) by mouth daily.     potassium chloride SA 20 MEQ tablet  Commonly known as:  K-DUR,KLOR-CON  Take 20 mEq by mouth daily.     potassium chloride SA 20 MEQ tablet  Commonly known as:  K-DUR,KLOR-CON  Take 1 tablet (20 mEq total) by mouth daily.     prochlorperazine 10 MG tablet  Commonly known as:  COMPAZINE  Take 1 tablet (10 mg total) by mouth every 6 (six) hours as needed (Nausea or vomiting).     pyridOXINE 100 MG tablet  Commonly known as:  VITAMIN B-6  Take 100 mg by mouth daily.         SignedStark Klein 04/25/2014, 12:04 PM

## 2014-04-25 NOTE — Plan of Care (Signed)
Problem: Phase I Progression Outcomes Goal: Pain controlled with appropriate interventions Outcome: Completed/Met Date Met:  04/25/14 Goal: OOB as tolerated unless otherwise ordered Outcome: Completed/Met Date Met:  04/25/14 Goal: Incision/dressings dry and intact Outcome: Completed/Met Date Met:  04/25/14 Goal: Sutures/staples intact Outcome: Not Applicable Date Met:  88/71/95 Goal: Initial discharge plan identified Outcome: Completed/Met Date Met:  04/25/14 Goal: Vital signs/hemodynamically stable Outcome: Completed/Met Date Met:  04/25/14

## 2014-04-27 ENCOUNTER — Telehealth (INDEPENDENT_AMBULATORY_CARE_PROVIDER_SITE_OTHER): Payer: Self-pay | Admitting: General Surgery

## 2014-04-27 NOTE — Telephone Encounter (Signed)
Discussed pathology with patient

## 2014-05-04 ENCOUNTER — Telehealth: Payer: Self-pay | Admitting: General Practice

## 2014-05-04 ENCOUNTER — Telehealth: Payer: Self-pay | Admitting: Oncology

## 2014-05-04 ENCOUNTER — Other Ambulatory Visit (HOSPITAL_BASED_OUTPATIENT_CLINIC_OR_DEPARTMENT_OTHER): Payer: No Typology Code available for payment source

## 2014-05-04 ENCOUNTER — Ambulatory Visit (HOSPITAL_BASED_OUTPATIENT_CLINIC_OR_DEPARTMENT_OTHER): Payer: No Typology Code available for payment source | Admitting: Oncology

## 2014-05-04 VITALS — BP 125/80 | HR 71 | Temp 97.8°F | Resp 18 | Ht 63.0 in | Wt 179.1 lb

## 2014-05-04 DIAGNOSIS — C773 Secondary and unspecified malignant neoplasm of axilla and upper limb lymph nodes: Secondary | ICD-10-CM

## 2014-05-04 DIAGNOSIS — C50412 Malignant neoplasm of upper-outer quadrant of left female breast: Secondary | ICD-10-CM

## 2014-05-04 DIAGNOSIS — D869 Sarcoidosis, unspecified: Secondary | ICD-10-CM

## 2014-05-04 DIAGNOSIS — Z720 Tobacco use: Secondary | ICD-10-CM

## 2014-05-04 DIAGNOSIS — F17200 Nicotine dependence, unspecified, uncomplicated: Secondary | ICD-10-CM

## 2014-05-04 LAB — CBC WITH DIFFERENTIAL/PLATELET
BASO%: 0.3 % (ref 0.0–2.0)
Basophils Absolute: 0 10*3/uL (ref 0.0–0.1)
EOS ABS: 0.3 10*3/uL (ref 0.0–0.5)
EOS%: 4 % (ref 0.0–7.0)
HCT: 33.8 % — ABNORMAL LOW (ref 34.8–46.6)
HGB: 11.1 g/dL — ABNORMAL LOW (ref 11.6–15.9)
LYMPH%: 17.2 % (ref 14.0–49.7)
MCH: 30.5 pg (ref 25.1–34.0)
MCHC: 32.8 g/dL (ref 31.5–36.0)
MCV: 92.9 fL (ref 79.5–101.0)
MONO#: 0.6 10*3/uL (ref 0.1–0.9)
MONO%: 8.8 % (ref 0.0–14.0)
NEUT%: 69.7 % (ref 38.4–76.8)
NEUTROS ABS: 4.7 10*3/uL (ref 1.5–6.5)
Platelets: 300 10*3/uL (ref 145–400)
RBC: 3.64 10*6/uL — ABNORMAL LOW (ref 3.70–5.45)
RDW: 13.8 % (ref 11.2–14.5)
WBC: 6.7 10*3/uL (ref 3.9–10.3)
lymph#: 1.2 10*3/uL (ref 0.9–3.3)

## 2014-05-04 LAB — COMPREHENSIVE METABOLIC PANEL (CC13)
ALBUMIN: 3.5 g/dL (ref 3.5–5.0)
ALK PHOS: 68 U/L (ref 40–150)
ALT: 21 U/L (ref 0–55)
AST: 19 U/L (ref 5–34)
Anion Gap: 13 mEq/L — ABNORMAL HIGH (ref 3–11)
BUN: 16.1 mg/dL (ref 7.0–26.0)
CO2: 24 mEq/L (ref 22–29)
Calcium: 9.8 mg/dL (ref 8.4–10.4)
Chloride: 100 mEq/L (ref 98–109)
Creatinine: 1.1 mg/dL (ref 0.6–1.1)
GLUCOSE: 215 mg/dL — AB (ref 70–140)
POTASSIUM: 3.7 meq/L (ref 3.5–5.1)
SODIUM: 137 meq/L (ref 136–145)
TOTAL PROTEIN: 7.5 g/dL (ref 6.4–8.3)
Total Bilirubin: 0.24 mg/dL (ref 0.20–1.20)

## 2014-05-04 NOTE — Progress Notes (Signed)
[place]  [phone] [phone]    ID: Shelly Hall OB: 1971-02-21  MR#: [id number]  ELT#:[id number]  PCP: Kerin Perna, NP GYN:  [name] SU: [name] OTHER MD: Thea Silversmith  CHIEF COMPLAINT:  Left Breast Cancer, estrogen receptor positive CURRENT TREATMENT: Awaiting adjuvant radiation  BREAST CANCER HISTORY: From the original intake note:  Shelly Hall palpated a mass in her left breast mid-April 2015 and immediately brought it to Dr. Benjie Karvonen' attention. She was set up for bilateral screening mammography with tomography at [facility] hospital 09/19/2013. The possible distortion in the left breast and a nearby group of calcifications was felt to warrant further evaluation, and on 09/30/2013 she underwent unilateral left diagnostic mammography and ultrasonography. There was an irregular mass in the outer left breast associated with microcalcifications. 4 cm anterior to this there was another cluster of pleomorphic calcifications spanning 7 mm. He had more anteriorly there was an irregular mass associated with other calcifications. In total the abnormalities in the left breast spent approximately 10 cm, and is segmental distribution. On exam there was a palpable firm mass at the 2:30 o'clock position of the left breast 10 cm from the nipple. There was palpable adenopathy in the inferior left axilla. Ultrasound confirmed an irregular hypoechoic mass in the left breast measuring 4.6 cm. In addition, a 1.4 cm oval slightly irregular mass was noted at the 3:00 position and yet another mass measured 9 mm in the same area. Ultrasound of the left axilla showed a dominant 3.6 cm lymph node.  Biopsy of 2 of the left breast masses and the suspicious left breast lymph node on 10/03/2013 showed ([id number]) an invasive ductal carcinoma, grade 2, estrogen receptor 90% positive with strong staining intensity, progesterone receptor 40% positive but moderate  staining intensity, with an MIB-1 of 20% and no HER-2 amplification. (Because all 3 biopsies were morphologically identical, only one prognostic panel was sent).  On 10/10/2013 the patient underwent bilateral breast MRI. This showed numerous enhancing masses in the left breast, the largest measuring 3.5 cm, and the aggregate measuring 12.7 cm. There were numerous enlarged left axillary lymph nodes, at least 5 of which were enlarged, both at levels 1 and 2. The largest measured 2.3 cm. The right breast was negative.  The patient's subsequent history is as detailed below.  INTERVAL HISTORY: Shelly Hall returns today for follow up of her breast cancer. She completed her neoadjuvant chemotherapy 03/19/2014 and proceeded to left mastectomy with axillary lymph node dissection 04/23/2014. Final pathology from that procedure ([id number]) showed an area of residual carcinoma in the breast measuring at least 1.7 cm, grade 3, and a total of 5 out of 11 lymph nodes sampled had micrometastatic disease, with evidence of extracapsular extension. Margins were ample at 5.5 cm. Repeat prognostic panel showed the estrogen receptor be to be 99% positive, the progesterone receptor 4% positive, both with strong staining intensity. 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 She did not have fever or bleeding problems. She does have pain. Some of it is due to the drain still in place and she is using oxycodone for that. This is constipating her severely. She is a ready and Merrill X and stool softeners and occasionally uses milk of magnesia. I have suggested she use magnesium citrate every 3 days if she does not have a bowel movement within 72 hours. In addition she is  having the "shooting pains" that people do experience from the surgery. This is more neuropathic, brief, and of course may take much longer to resolve. She is using  gabapentin for hot flashes and it can be used for this as well. Aside from these issues she is having some left shoulder discomfort, a little bit of blurred vision, and her sugars have been less well controlled so they diabetes medications have been adjusted up. Her nails are still dark, but they are coming in clear, as expected. She understands that we'll be several months before she has new fingernails and twice as long before she has completely normal toenails. Importantly she has had a little bit of spotting. Aside from these issues a detailed review of systems today was stable  PAST MEDICAL HISTORY: Past Medical History  Diagnosis Date  . Hypertension   . Sarcoidosis   . Anxiety   . Depression   . Anemia   . Complication of anesthesia     makes pt. restless and unable to be still  . High cholesterol   . Heart murmur   . Breast cancer 2015    left;  had chemo  . Thyroid cancer 2015  . Type II diabetes mellitus   . Migraine     "in the past; none in years" (04/23/2014)    PAST SURGICAL HISTORY: Past Surgical History  Procedure Laterality Date  . Tubal ligation  2001  . Endobronchial ultrasound Bilateral 12/02/2012    Procedure: ENDOBRONCHIAL ULTRASOUND;  Surgeon: Collene Gobble, MD;  Location: [facility];  Service: Cardiopulmonary;  Laterality: Bilateral;  . Portacath placement N/A 10/22/2013    Procedure: INSERTION PORT-A-CATH;  Surgeon: [name], MD;  Location: [facility];  Service: General;  Laterality: N/A;  . Lung biopsy    . Thyroid lobectomy Left 04/23/2014  . Mastectomy w/ sentinel node biopsy Left 04/23/2014    & axillary LND  . Breast biopsy Left 2015 X 3  . Mastectomy w/ sentinel node biopsy Left 04/23/2014    Procedure: LEFT BREAST MASTECTOMY WITH SENTINEL LYMPH NODE BIOPSY AND AXILLARY LYMPH NODE DISECTION;  Surgeon: [name], MD;  Location: [facility];  Service: General;  Laterality: Left;  . Thyroid lobectomy Left 04/23/2014    Procedure: LEFT THYROID LOBECTOMY;   Surgeon: [name], MD;  Location: [facility];  Service: General;  Laterality: Left;    FAMILY HISTORY Family History  Problem Relation Age of Onset  . Cancer Paternal Aunt     "bone cancer"; deceased 53s  . Cancer Paternal Uncle     stomach cancer; deceased 51s  . Cancer Paternal Aunt     unk. primary; currently 24s  . Prostate cancer Maternal Grandfather 57  The patient's parents are living, in fair mid to late 64s. The patient had one brother, no sisters. There is no history of breast or ovarian cancer in the family to her knowledge.   GYNECOLOGIC HISTORY:   (Reviewed 11/27/2013)  menarche age 86, first live birth at 50. She is GX P2. She was still having regular periods at the time of the start of her chemotherapy in May 2015.  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 She is single and lives alone, although her mother is currently staying with her. Her son Shelly Hall lives in [place] and works at [employer]. Son Shelly Hall lives in [place] where he works at a [employer]. The patient has 1 grandchild born in Sept 2015, by  her son, Shelly Cowing. She is a Psychologist, forensic.    ADVANCED DIRECTIVES: not in place   HEALTH MAINTENANCE: (Updated 11/27/2013) History  Substance Use Topics  . Smoking status: Current Some Day Smoker -- 0.25 packs/day for 24 years    Types: Cigarettes  . Smokeless tobacco: Never Used     Comment: 04/23/2014 "using vapor; down to 3-4 cigarettes/day now"  . Alcohol Use: 6.0 oz/week    2 Not specified, 4 Cans of beer, 4 Shots of liquor per week     Colonoscopy: Never  PAP: Not on file  Bone density: Not on file  Lipid panel:  Not on file  Allergies  Allergen Reactions  . Nyquil Multi-Symptom [Pseudoeph-Doxylamine-Dm-Apap] Itching and Other (See Comments)    Skin peels on hands, and feet    Current Outpatient Prescriptions  Medication Sig Dispense Refill  . acyclovir (ZOVIRAX) 400 MG tablet  Take 1 tablet (400 mg total) by mouth 2 (two) times daily. Beginning 11/12/2013 60 tablet 4  . albuterol (PROVENTIL HFA;VENTOLIN HFA) 108 (90 BASE) MCG/ACT inhaler Inhale 2 puffs into the lungs every 6 (six) hours as needed for wheezing or shortness of breath.    Marland Kitchen atenolol-chlorthalidone (TENORETIC) 50-25 MG per tablet Take 1 tablet by mouth daily. 30 tablet 1  . dexamethasone (DECADRON) 4 MG tablet Take 2 tablets (8 mg total) by mouth 2 (two) times daily with a meal. Start the day after chemotherapy for 2 days. Take with food. (Patient not taking: Reported on 04/15/2014) 30 tablet 1  . docusate sodium (COLACE) 100 MG capsule Take 300 mg by mouth daily.     . ferrous sulfate 325 (65 FE) MG tablet Take 325 mg by mouth 2 (two) times daily with a meal.    . gabapentin (NEURONTIN) 300 MG capsule Take 1 capsule (300 mg total) by mouth at bedtime. 30 capsule 2  . gemfibrozil (LOPID) 600 MG tablet Take 600 mg by mouth 2 (two) times daily before a meal.    . lidocaine-prilocaine (EMLA) cream Apply 1 application topically as needed. Apply over port area 1-2 hours before chemo and cover with plastic wrap (Patient not taking: Reported on 04/15/2014) 30 g 0  . lisinopril (PRINIVIL,ZESTRIL) 10 MG tablet Take 10 mg by mouth daily.    . Loratadine 10 MG CAPS Take 1 capsule (10 mg total) by mouth as directed. (Patient not taking: Reported on 04/15/2014) 30 each 3  . LORazepam (ATIVAN) 0.5 MG tablet Take 1 tablet (0.5 mg total) by mouth at bedtime as needed (Nausea or vomiting). 30 tablet 1  . metFORMIN (GLUCOPHAGE) 500 MG tablet Take 1 tablet (500 mg total) by mouth 2 (two) times daily with a meal. (Patient taking differently: Take 1,000 mg by mouth 2 (two) times daily with a meal. ) 60 tablet 1  . Multiple Vitamin (MULTIVITAMIN WITH MINERALS) TABS tablet Take 1 tablet by mouth daily.    . ondansetron (ZOFRAN) 8 MG tablet Take 1 tablet (8 mg total) by mouth 2 (two) times daily. Start the day after chemo for 2 days.  Then take as needed for nausea or vomiting. (Patient not taking: Reported on 04/15/2014) 30 tablet 1  . oxyCODONE (OXY IR/ROXICODONE) 5 MG immediate release tablet Take 1-2 tablets (5-10 mg total) by mouth every 4 (four) hours as needed for severe pain. 80 tablet 0  . pantoprazole (PROTONIX) 40 MG tablet Take 1 tablet (40 mg total) by mouth daily. 30 tablet 5  . potassium chloride SA (K-DUR,KLOR-CON) 20 MEQ tablet  Take 1 tablet (20 mEq total) by mouth daily. (Patient not taking: Reported on 04/15/2014) 30 tablet 6  . potassium chloride SA (K-DUR,KLOR-CON) 20 MEQ tablet Take 20 mEq by mouth daily.    . prochlorperazine (COMPAZINE) 10 MG tablet Take 1 tablet (10 mg total) by mouth every 6 (six) hours as needed (Nausea or vomiting). (Patient not taking: Reported on 04/15/2014) 30 tablet 1  . pyridOXINE (VITAMIN B-6) 100 MG tablet Take 100 mg by mouth daily.     No current facility-administered medications for this visit.    OBJECTIVE: Middle-aged [place] American woman who appears stated age 88 Vitals:   05/04/14 1104  BP: 125/80  Pulse: 71  Temp: 97.8 F (36.6 C)  Resp: 18     Body mass index is 31.73 kg/(m^2).    ECOG FS:1 - Symptomatic but completely ambulatory  Sclerae unicteric, pupils equal and reactive Oropharynx clear and moist No cervical or supraclavicular adenopathy Lungs no rales or rhonchi Heart regular rate and rhythm Abd soft, nontender, positive bowel sounds MSK no focal spinal tenderness, no upper extremity lymphedema Neuro: nonfocal, well oriented, appropriate affect Breasts: The right breast is unremarkable per the left breast is status post mastectomy and external lymph node dissection. Steri-Strips are in place. The incision is healing very nicely, with no dehiscence or evidence of inflammation. The left axilla is benign.  Skin: Port still in place, benign  LAB RESULTS:   Lab Results  Component Value Date   WBC 6.7 05/04/2014   NEUTROABS 4.7 05/04/2014    HGB 11.1* 05/04/2014   HCT 33.8* 05/04/2014   MCV 92.9 05/04/2014   PLT 300 05/04/2014      Chemistry      Component Value Date/Time   NA 140 04/25/2014 0509   NA 139 03/18/2014 1034   K 4.7 04/25/2014 0509   K 4.6 03/18/2014 1034   CL 101 04/25/2014 0509   CO2 25 04/25/2014 0509   CO2 24 03/18/2014 1034   BUN 10 04/25/2014 0509   BUN 13.1 03/18/2014 1034   CREATININE 1.10 04/25/2014 0509   CREATININE 1.1 03/18/2014 1034      Component Value Date/Time   CALCIUM 9.1 04/25/2014 0509   CALCIUM 10.0 03/18/2014 1034   ALKPHOS 72 03/18/2014 1034   ALKPHOS 81 11/27/2012 0900   AST 21 03/18/2014 1034   AST 22 11/27/2012 0900   ALT 31 03/18/2014 1034   ALT 17 11/27/2012 0900   BILITOT <0.20 03/18/2014 1034   BILITOT 0.1* 11/27/2012 0900      STUDIES: Dg Chest 2 View  04/15/2014   CLINICAL DATA:  Breast cancer. Thyroid activity decreased. Sarcoidosis.  EXAM: CHEST  2 VIEW  COMPARISON:  10/23/2011  FINDINGS: Port-A-Cath tip in the SVC unchanged. Lungs are clear without infiltrate or effusion  Bilateral hilar and right peritracheal adenopathy as noted on prior studies. This may be related to sarcoid however lymphoma and metastatic disease cannot be excluded. Heart size upper normal. Negative for heart failure.  IMPRESSION: Bilateral hilar and right peritracheal adenopathy. This is stable and therefore most likely related to sarcoidosis rather than lymphoma or metastatic disease.   Electronically Signed   By: Franchot Gallo M.D.   On: 04/15/2014 15:31   Nm Sentinel Node Inj-no Rpt (breast)  04/23/2014   CLINICAL DATA: left breast cancer   Sulfur colloid was injected intradermally by the nuclear medicine  technologist for breast cancer sentinel node localization.      ASSESSMENT: 43 y.o. BRCA negative [facility]  woman with a history of sarcoidosis and breast cancer as follows:  (1)  status post left breast and left axillary lymph node biopsy 10/03/2013, both positive for a clinical  T2 N2, stage IIIA invasive ductal carcinoma, grade 2, estrogen and progesterone receptor positive, HER-2 negative, with an MIB-1 of 20%.  (2) Treated with neoadjuvant chemotherapy consisting of doxorubicin and cyclophosphamide in dose dense fashion x4, completed 12/11/2013, followed by paclitaxel weekly x12 (dose reduced by 20% at cycle 5 because of neuropathy concerns) completed 03/19/2014.  (3) status post left mastectomy and left axillary lymph node dissection 04/23/2014 for a pT1c pN2, stage IIIA invasive ductal carcinoma, grade 3, estrogen receptor 99% positive, progesterone receptor 4% positive  (3) radiation to follow surgery  (4) tamoxifen to be started at the end of radiation  (5) continuing tobacco abuse: The patient has been strongly advised to discontinue smoking  (6) genetic testing sent 10/16/2013 did not reveal any clearly pathogenic (harmful) mutation in any of these genes. The genes tested were ATM, BARD1, BRCA1, BRCA2, BRIP1, CDH1, CHEK2, MRE11A, MUTYH, NBN, NF1, PALB2, PTEN, RAD50, RAD51C, RAD51D, and TP53.  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 Of course she still has some acute pain due to the drains being in place. In addition she has neuropathic pain from the surgery itself, and that can take much longer to resolve. She is on gabapentin and she is welcome to use that more frequently if she wishes. We also discussed a bowel prophylaxis regimen which she needs to intensify until she is off narcotics.  Next up for her is radiation and I am requesting an appointment with the radiation oncologist sometime in the next week or so to start getting that planned.  Bionca will then return to see me in February. We will probably start tamoxifen at that point. Note that she has begun spotting, and if she resumes her periods we will need to either remove her ovaries or  start goserelin as per the SOFT/ TEXT studies  She had been thinking of having a hysterectomy, but if we turn the ovaries off or remove them, the fibroids should involute and she should not have significant bleeding problems. In the meantime she is continuing the iron supplementation.  The patient has a good understanding of the overall plan. She agrees with it. She knows the goal of treatment in her case is cure. She will call with any problems that may develop before her next visit here. Chauncey Cruel, MD 05/04/2014 11:13 AM

## 2014-05-04 NOTE — Telephone Encounter (Signed)
, °

## 2014-05-05 ENCOUNTER — Encounter: Payer: Self-pay | Admitting: Radiation Oncology

## 2014-05-05 NOTE — Progress Notes (Signed)
Location of Breast Cancer:  Left upper outer  Histology per Pathology Report:  04/23/14 Diagnosis 1. Lymph node, sentinel, biopsy, left axillary #1 - METASTATIC CARCINOMA IN 1 OF 1 LYMPH NODE (1/1). 2. Lymph node, sentinel, biopsy, left axillary #2 - METASTATIC CARCINOMA IN 1 OF 1 LYMPH NODE (1/1), WITH EXTRACAPSULAR EXTENSION. 3. Lymph node, sentinel, biopsy, left axillary #3 - BENIGN FIBROADIPOSE TISSUE AND PERIPHERAL NERVE. - LYMPH NODAL TISSUE IS NOT IDENTIFIED. - THERE IS NO EVIDENCE OF MALIGNANCY. 4. 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 - DUCTAL CARCINOMA IN SITU WITH CALCIFICATIONS, HIGH GRADE. - LYMPHOVASCULAR INVASION IS IDENTIFIED. - THE SURGICAL RESECTION MARGINS ARE NEGATIVE FOR CARCINOMA. - SEE ONCOLOGY TABLE BELOW. 5. Lymph nodes, regional resection, left axillary contents - METASTATIC CARCINOMA IN 3 OF 9 LYMPH NODES (3/9), WITH EXTRACAPSULAR EXTENSION. 6. Thyroid, lobectomy, left - FOLLICULAR ADENOMA, 0.8 CM. - PARATHYROID TISSUE. - THERE IS NO EVIDENCE OF MALIGNANCY. Microscopic Comment 4. BREAST, INVASIVE TUMOR, WITH LYMPH NODES PRESENT  10/03/13 Diagnosis 1. Breast, left, needle core biopsy, mass, 3 o'clock, 3 cm fn - INVASIVE DUCTAL CARCINOMA. - DUCTAL CARCINOMA IN SITU WITH CALCIFICATIONS. - SEE COMMENT. 2. Breast, left, needle core biopsy, mass, 2:30, 10 cm fn - INVASIVE DUCTAL CARCINOMA. - DUCTAL CARCINOMA IN SITU WITH CALCIFICATIONS. - SEE COMMENT. 3. Lymph node, needle/core biopsy, left axilla - INVASIVE DUCTAL CARCINOMA.  Receptor Status: ER(99%), PR (4%), Her2-neu (-)  Did patient present with symptoms (if so, please note symptoms) or was this found on screening mammography?: patient palpated a mass mid April  Past/Anticipated interventions by surgeon, if any: biopsy, simple mastectomy w/node sampling  Past/Anticipated interventions by medical oncology, if any: Chemotherapy Dr  Jana Hakim: Treated with neoadjuvant chemotherapy consisting of doxorubicin and cyclophosphamide in dose dense fashion x4, completed 12/11/2013, followed by paclitaxel weekly x12 (dose reduced by 20% at cycle 5 because of neuropathy concerns) completed 03/19/2014. (3) status post left mastectomy and left axillary lymph node dissection 04/23/2014 for a pT1c pN2, stage IIIA invasive ductal carcinoma, grade 3, estrogen receptor 99% positive, progesterone receptor 4% positive (3) radiation to follow surgery (4) tamoxifen to be started at the end of radiation  Lymphedema issues, if any:      Pain issues, if any:     SAFETY ISSUES:  Prior radiation? no  Pacemaker/ICD? no  Possible current pregnancy?  Is the patient on methotrexate? no  Current Complaints / other details:  menarche age 19, first live birth at 27. She is GX P2. She still having regular periods, which tend to be heavy. 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 She is single and lives alone. Her son Denis Koppel lives in [place] and works at [employer]. Son TevinDeVonteTurner lives in [place] where he works at a [employer]. The patient has 1 grandchild on the way. She is a Psychologist, forensic.    Jacobo Forest, Verneita Griffes, RN 05/05/2014,3:06 PM

## 2014-05-06 ENCOUNTER — Encounter: Payer: Self-pay | Admitting: Radiation Oncology

## 2014-05-06 ENCOUNTER — Telehealth: Payer: Self-pay

## 2014-05-06 ENCOUNTER — Ambulatory Visit
Admission: RE | Admit: 2014-05-06 | Discharge: 2014-05-06 | Disposition: A | Discharge status: Home / Self Care | Payer: Medicaid Other | Source: Ambulatory Visit | Attending: Radiation Oncology | Admitting: Radiation Oncology

## 2014-05-06 DIAGNOSIS — Z9012 Acquired absence of left breast and nipple: Secondary | ICD-10-CM | POA: Insufficient documentation

## 2014-05-06 DIAGNOSIS — Z9221 Personal history of antineoplastic chemotherapy: Secondary | ICD-10-CM | POA: Insufficient documentation

## 2014-05-06 DIAGNOSIS — Z170 Estrogen receptor positive status [ER+]: Secondary | ICD-10-CM | POA: Diagnosis not present

## 2014-05-06 DIAGNOSIS — Z510 Encounter for antineoplastic radiation therapy: Secondary | ICD-10-CM | POA: Diagnosis present

## 2014-05-06 DIAGNOSIS — C50412 Malignant neoplasm of upper-outer quadrant of left female breast: Secondary | ICD-10-CM | POA: Insufficient documentation

## 2014-05-06 NOTE — Addendum Note (Signed)
Addended by: Laureen Abrahams on: 05/06/2014 06:40 PM   Modules accepted: Orders, Medications

## 2014-05-06 NOTE — Progress Notes (Signed)
   Department of Radiation Oncology  Phone:  [phone] Fax:        [phone]   Name: Shelly Hall MRN: [id number]  DOB: 1971/05/13  Date: 05/06/2014  Follow Up Visit Note  Diagnosis: Breast cancer of upper-outer quadrant of left female breast   Staging form: Breast, AJCC 7th Edition     Clinical: Stage IIIA (T3, N1, cM0) - Unsigned       Staging comments: Staged at breast conference 10/15/13  Interval History: Shelly Hall presents today for routine followup.  She underwent neoadjuvant chemotherapy. She had a left mastectomy and axillary node dissection on 11/19 which showed 1.7 cm of residual invasive disease with 5/11 LN positive. She is recovering slowly from her surgery. She sees Dr. Barry Hall on Monday. She is accompanied by her mother.  She is interested in reconstruction. She is not working.   Physical Exam:  Pleasant female. Steristrips still in place. No swelling. Drains in place. No signs of infection. Limited range of motion of right arm.   IMPRESSION: Shelly Hall is a 43 y.o. female s/p neoadjuvant chemotherapy with partial response and reisdual nodes positive  PLAN:  I spoke to the patient today regarding her diagnosis and options for treatment. . We discussed the role of radiation in decreasing local failures in patients who undergo mastectomy and have positive lymph nodes or tumors greater than 5 cm. We discussed the role of radiation and decreasing local failures in patients who undergo lumpectomy.We discussed the possible side effects including but not limited to asymptomatic rib, heart and lung damage, heart disease, skin redness, fatigue, permanent skin darkening, and chest wall swelling. We discussed increased complications that can occur with reconstruction after radiation. We discussed the process of simulation and the placement tattoos. We discussed the low likelihood of secondary malignancies.   I will ask our breast cancer navigators to talk to her about reconstruction  options with Medicaid.    She still has a ways to go in terms of healing and range of motion. I will schedule her for simulation the last week of December and plan on starting first of January.   I gave her information on the ABC class to discuss with Dr. Cher Hall on Monday.   Shelly Silversmith, MD

## 2014-05-06 NOTE — Telephone Encounter (Signed)
Left voice [phone]) to get appointment for porta cath flush in next 2 weeks.

## 2014-05-06 NOTE — Progress Notes (Signed)
Please see the Nurse Progress Note in the MD Initial Consult Encounter for this patient. 

## 2014-05-14 ENCOUNTER — Telehealth: Payer: Self-pay | Admitting: *Deleted

## 2014-05-14 NOTE — Telephone Encounter (Signed)
Called pt to discuss reconstruction with Medicaid. Informed pt that I has spoke to Dr. Harlow Mares office and was informed that her PCP on the back of her card would need to send the referral. Gave pt the number to Dr. Harlow Mares and name of office manager to discuss what she needs further. Received verbal understanding. Pt denies further needs at this time. Encourage pt to call with further questions or concerns. Contact information given.

## 2014-05-25 ENCOUNTER — Other Ambulatory Visit: Payer: Self-pay | Admitting: *Deleted

## 2014-05-25 DIAGNOSIS — C50412 Malignant neoplasm of upper-outer quadrant of left female breast: Secondary | ICD-10-CM

## 2014-06-02 ENCOUNTER — Ambulatory Visit: Payer: Medicaid Other | Admitting: Radiation Oncology

## 2014-06-03 ENCOUNTER — Ambulatory Visit: Payer: No Typology Code available for payment source | Attending: Oncology | Admitting: Physical Therapy

## 2014-06-03 DIAGNOSIS — M24512 Contracture, left shoulder: Secondary | ICD-10-CM | POA: Diagnosis not present

## 2014-06-03 DIAGNOSIS — I972 Postmastectomy lymphedema syndrome: Secondary | ICD-10-CM | POA: Diagnosis not present

## 2014-06-03 NOTE — Patient Instructions (Addendum)
   Flexion (Eccentric) - Active-Assist (Cane)   Use unaffected arm to push affected arm forward. Avoid hiking shoulder. Keep palm relaxed. Slowly lower affected arm for 3-5 seconds, increasing use of affected arm. ___ reps per set, ___ sets per day, ___ days per week. Add ___ lbs when you achieve ___ repetitions.  Copyright  VHI. All rights reserved  Cane Exercise: Abduction   Hold cane with right hand over end, palm-up, with other hand palm-down. Move arm out from side and up by pushing with other arm. Hold ____ seconds. Repeat ____ times. Do ____ sessions per day.  [email or web address].[email or web address]   Copyright  VHI. All rights reserved.

## 2014-06-04 ENCOUNTER — Ambulatory Visit: Payer: Medicaid Other | Admitting: Physical Therapy

## 2014-06-04 NOTE — Therapy (Signed)
[place], [place], [address] Phone: [phone]   Fax:  [phone]  Physical Therapy Evaluation  Patient Details  Name: Shelly Hall MRN: [id number] Date of Birth: June 06, 1970  Encounter Date: 06/03/2014      PT End of Session - 06/04/14 0803    Visit Number 1   Number of Visits 4   Date for PT Re-Evaluation 06/24/14   PT Start Time 1430   PT Stop Time 1520   PT Time Calculation (min) 50 min   Activity Tolerance Patient tolerated treatment well   Behavior During Therapy [facility] for tasks assessed/performed      Past Medical History  Diagnosis Date  . Hypertension   . Sarcoidosis   . Anxiety   . Depression   . Anemia   . Complication of anesthesia     makes pt. restless and unable to be still  . High cholesterol   . Heart murmur   . Thyroid cancer 2015  . Type II diabetes mellitus   . Migraine     "in the past; none in years" (04/23/2014)  . Breast cancer 2015    left upper outer;  had chemo    Past Surgical History  Procedure Laterality Date  . Tubal ligation  2001  . Endobronchial ultrasound Bilateral 12/02/2012    Procedure: ENDOBRONCHIAL ULTRASOUND;  Surgeon: Collene Gobble, MD;  [facility];  Service: Cardiopulmonary;  Laterality: Bilateral;  . Portacath placement N/A 10/22/2013    Procedure: INSERTION PORT-A-CATH;  Surgeon: [name], MD;  Location: [place];  Service: General;  Laterality: N/A;  . Lung biopsy    . Mastectomy w/ sentinel node biopsy Left 04/23/2014    & axillary LND  . Breast biopsy Left 2015 X 3  . Mastectomy w/ sentinel node biopsy Left 04/23/2014    Procedure: LEFT BREAST MASTECTOMY WITH SENTINEL LYMPH NODE BIOPSY AND AXILLARY LYMPH NODE DISECTION;  Surgeon: [name], MD;  Location: [place];  Service: General;  Laterality: Left;  . Thyroid lobectomy Left 04/23/2014    Procedure: LEFT THYROID LOBECTOMY;  Surgeon: [name], MD;  Location: [place];  Service: General;  Laterality: Left;    There were no vitals taken for this visit.  Visit Diagnosis:  Contracture of shoulder, left  Lymphedema syndrome, postmastectomy      Subjective Assessment - 06/04/14 0800    Symptoms Pt is here because she is having trouble raising her arm.  She is supposed to start radiation therapy and knows that she cannot raise her arm enough to get the treatment.  She has been trying to do exercises at home, but her shoulder feels thight and she feels like it gets hard in front of her shoulder when she tries to do ti.   Currently in Pain? Yes   Pain Score 2    Pain Location Shoulder   Pain Orientation Left   Pain Descriptors / Indicators Tightness          [facility] PT Assessment - 06/04/14 1423    Assessment   Medical Diagnosis left mastectomy   Onset Date 04/23/14   Precautions   Precautions None   Home Environment   Living Enviornment Private residence   Living Arrangements Parent;Children   Available Help at Discharge Family;Available 24 hours/day   Type of Home Apartment   Home Access Stairs to enter   Entrance Stairs-Number of Steps 8   Home Layout One level   Prior Function  Level of Independence Independent with basic ADLs;Independent with homemaking with ambulation;Independent with gait;Independent with transfers   [place] Unemployed   Leisure read, watch TV, paint ornaments   Cognition   Overall Cognitive Status Within Functional Limits for tasks assessed   Observation/Other Assessments   Skin Integrity healing incision at left chest with open area ~ 2.5 cm x 1.5 cm with beefy red wound base with another (~ 1 cm ) opening with yellowish drainage along incision line   Posture/Postural Control   Postural Limitations Forward head   AROM   Left Shoulder Extension 60 Degrees   Left Shoulder Flexion 120 Degrees   Left Shoulder ABduction 87 Degrees   Left Shoulder Internal Rotation 70 Degrees   Left Shoulder External Rotation 90  Degrees   Strength   Overall Strength Comments generalized decreased strength in left upper quarter though pt is able to reisist one time isometric test     Right shoulder AROM and strength are WNL       LYMPHEDEMA/ONCOLOGY QUESTIONNAIRE - 06/03/14 1500    Right Upper Extremity Lymphedema   15 cm Proximal to Olecranon Process 32.5 cm   10 cm Proximal to Olecranon Process 30.5 cm   Olecranon Process 25.2 cm   15 cm Proximal to Ulnar Styloid Process 23.6 cm   10 cm Proximal to Ulnar Styloid Process 20.5 cm   Just Proximal to Ulnar Styloid Process 24.8 cm   Across Hand at [employer] 18.5 cm   At [place] of 2nd Digit 6 cm   Left Upper Extremity Lymphedema   15 cm Proximal to Olecranon Process 33 cm   10 cm Proximal to Olecranon Process 31.4 cm   Olecranon Process 25.2 cm   15 cm Proximal to Ulnar Styloid Process 23.2 cm   10 cm Proximal to Ulnar Styloid Process 19.7 cm   Just Proximal to Ulnar Styloid Process 14.9 cm   Across Hand at [employer] 18.3 cm   At [place] of 2nd Digit 5.8 cm           Quick Dash - 06/04/14 0001    Open a tight or new jar Mild difficulty   Do heavy household chores (wash walls, wash floors) Mild difficulty   Carry a shopping bag or briefcase No difficulty   Wash your back Moderate difficulty   Use a knife to cut food Mild difficulty   Recreational activities in which you take some force or impact through your arm, shoulder, or hand (golf, hammering, tennis) Severe difficulty   During the past week, to what extent has your arm, shoulder or hand problem interfered with your normal social activities with family, friends, neighbors, or groups? Slightly   During the past week, to what extent has your arm, shoulder or hand problem limited your work or other regular daily activities Modererately   Arm, shoulder, or hand pain. Moderate   Tingling (pins and needles) in your arm, shoulder, or hand None   Difficulty Sleeping No difficulty   DASH Score  29.55 %                    PT Education - 06/04/14 0803    Education provided Yes   Education Details cane exercises   Person(s) Educated Patient   Methods Explanation;Demonstration;Handout   Comprehension Verbalized understanding;Returned demonstration           Short Term Clinic Goals - 06/04/14 1418    CC Short Term Goal  #1  Title short term goals=long term goals             Long Term Clinic Goals - 06/04/14 1418    CC Long Term Goal  #1   Title pt will verbalize understanding of lymphedema risk reduction practices and use of compression to manage edema   Baseline no knowledge   Time 4   Period Weeks   CC Long Term Goal  #2   Title pt will have 140 degrees of shoulder abduction so that she can receive radiation therapy    Baseline 87 degrees   Time 4   Period Weeks   Status New   CC Long Term Goal  #3   Title pt will be independent in a home exercise program    Baseline no knowledge   Time 4   Period Weeks   Status New   CC Long Term Goal  #4   Title pt will state edema in left shoulder/axilla has decreased by 50%   Baseline edema at 100%    Time 4   Period Weeks   Status New            Plan - 06/04/14 0805    Clinical Impression Statement Ms. Peregoy has limited left shoulder range of motion with delayed healing and edema at anterior and postereior left  shoulder after mastectomy with axillary node dissection.     Pt will benefit from skilled therapeutic intervention in order to improve on the following deficits Decreased strength;Decreased activity tolerance;Impaired UE functional use;Decreased range of motion;Decreased safety awareness;Decreased skin integrity;Postural dysfunction;Decreased endurance;Increased edema   Rehab Potential Good   Clinical Impairments Affecting Rehab Potential delayed healing   PT Frequency 1x / week  if Medicaid, increase frequency if insurance changes   PT Duration 3 weeks         Problem  List Patient Active Problem List   Diagnosis Date Noted  . Breast cancer 04/23/2014  . Gum symptoms 03/18/2014  . Peripheral neuropathy due to chemotherapy 01/22/2014  . Hot flashes 01/15/2014  . 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 Esophageal reflux 11/27/2013  . 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 Recurrent genital herpes 11/06/2013  . Chemotherapy induced neutropenia 11/06/2013  . Anemia, unspecified 11/06/2013  . Diabetes mellitus type II, non insulin dependent 11/06/2013  . Left thyroid nodule 11/06/2013  . Sarcoidosis 10/15/2013  . Breast cancer of upper-outer quadrant of left female breast 10/10/2013  . Mediastinal lymphadenopathy 11/21/2012  . Tobacco use disorder 11/21/2012   Donato Heinz. Owens Shark PT  Norwood Levo 06/04/2014, 2:24 PM  [place], [place], [address] Phone: [phone]   Fax:  [phone]

## 2014-06-10 ENCOUNTER — Ambulatory Visit: Payer: Medicaid Other | Attending: Oncology

## 2014-06-10 ENCOUNTER — Ambulatory Visit: Payer: Medicaid Other | Admitting: Radiation Oncology

## 2014-06-10 ENCOUNTER — Ambulatory Visit
Admission: RE | Admit: 2014-06-10 | Discharge: 2014-06-10 | Disposition: A | Discharge status: Home / Self Care | Payer: Medicaid Other | Source: Ambulatory Visit | Attending: Radiation Oncology | Admitting: Radiation Oncology

## 2014-06-10 DIAGNOSIS — C50412 Malignant neoplasm of upper-outer quadrant of left female breast: Secondary | ICD-10-CM

## 2014-06-10 DIAGNOSIS — I972 Postmastectomy lymphedema syndrome: Secondary | ICD-10-CM | POA: Diagnosis not present

## 2014-06-10 DIAGNOSIS — M24512 Contracture, left shoulder: Secondary | ICD-10-CM | POA: Insufficient documentation

## 2014-06-10 DIAGNOSIS — Z510 Encounter for antineoplastic radiation therapy: Secondary | ICD-10-CM | POA: Diagnosis not present

## 2014-06-10 NOTE — Progress Notes (Signed)
Radiation Oncology         [phone] ________________________________  Name: Shelly Hall      MRN: [id number]          Date: 06/10/2014              DOB: 1971/01/18  Optical Surface Tracking Plan:  Since intensity modulated radiotherapy (IMRT) and 3D conformal radiation treatment methods are predicated on accurate and precise positioning for treatment, intrafraction motion monitoring is medically necessary to ensure accurate and safe treatment delivery.  The ability to quantify intrafraction motion without excessive ionizing radiation dose can only be performed with optical surface tracking. Accordingly, surface imaging offers the opportunity to obtain 3D measurements of patient position throughout IMRT and 3D treatments without excessive radiation exposure.  I am ordering optical surface tracking for this patient's upcoming course of radiotherapy. ________________________________ Signature   Reference:   Ursula Alert, J, et al. Surface imaging-based analysis of intrafraction motion for breast radiotherapy patients.Journal of [place], n. 6, nov. 2014. ISSN [id number].   Available at: <http://www.jacmp.org/index.php/jacmp/article/view/4957>.

## 2014-06-10 NOTE — Progress Notes (Signed)
Name: Shelly Hall   MRN: [id number]  Date:  06/10/2014  DOB: 1971-02-13  Status:outpatient   DIAGNOSIS: Left Breast cancer.  CONSENT VERIFIED: yes SET UP: Patient is setup supine  IMMOBILIZATION:  The following immobilization was used:Custom Moldable Pillow, breast board.  NARRATIVE: [name] was brought to the [place] [facility].  Identity was confirmed.  All relevant records and images related to the planned course of therapy were reviewed.  Then, the patient was positioned in a stable reproducible clinical set-up for radiation therapy.  Wires were placed to delineate the clinical extent of breast tissue. A wire was placed on the scar as well.  CT images were obtained.  An isocenter was placed. Skin markings were placed.  The position of the heart was then analyzed.  Due to the proximity of the heart to the chest wall, I felt she would benefit from deep inspiration breath hold for cardiac sparing.  She was then coached and rescanned in the breath hold position.  Acceptable cardiac sparing was achieved. The CT images were loaded into the planning software where the target and avoidance structures were contoured.  The radiation prescription was entered and confirmed. The patient was discharged in stable condition and tolerated simulation well.    TREATMENT PLANNING NOTE/3D Simulation Note Treatment planning then occurred. I have requested : MLC's, isodose plan, basic dose calculation  3D simulation was performed.  I personally supervised and oversaw the construction of 5 medically necessary complex treatment devices in the form of MLCs which will be used for beam modification purposes and to protect critical structures including the heart and lung as well as the immobilization devices which will be used to ensure reproducible set up.  I have requested a dose volume histogram of the heart lung and tumor cavity.    RESPIRATORY MOTION MANAGEMENT SIMULATION - Deep Inspiration Breath  Hold  NARRATIVE:  In order to account for effect of respiratory motion on target structures and other organs in the planning and delivery of radiotherapy, this patient underwent respiratory motion management simulation.  To accomplish this, when the patient was brought to the CT simulation planning suite, a bellows was placed on the her abdomen.  Wave forms of the patient's breathing were obtained. Coaching was performed and practice sessions initiated to monitor her ability to obtain and maintain deep inspiration breath hold.  The CT images were loaded into the planning software and fused with her free breathing images by physics.  Acceptable cardiac sparing was achieved through the use of deep inspiration breath hold.  Planning will be performed on her breath hold scan

## 2014-06-10 NOTE — Therapy (Signed)
[place], [place], [address] Phone: [phone]   Fax:  [phone]  Physical Therapy Treatment  Patient Details  Name: Shelly Hall MRN: [id number] Date of Birth: [birth year] Referring Provider:  Kerin Perna, NP  Encounter Date: 06/10/2014      PT End of Session - 06/10/14 1514    Visit Number 2   Number of Visits 4   Date for PT Re-Evaluation 06/24/14   PT Start Time 9323   PT Stop Time 1520   PT Time Calculation (min) 45 min      Past Medical History  Diagnosis Date  . Hypertension   . Sarcoidosis   . Anxiety   . Depression   . Anemia   . Complication of anesthesia     makes pt. restless and unable to be still  . High cholesterol   . Heart murmur   . Thyroid cancer 2015  . Type II diabetes mellitus   . Migraine     "in the past; none in years" (04/23/2014)  . Breast cancer 2015    left upper outer;  had chemo    Past Surgical History  Procedure Laterality Date  . Tubal ligation  2001  . Endobronchial ultrasound Bilateral 12/02/2012    Procedure: ENDOBRONCHIAL ULTRASOUND;  Surgeon: Collene Gobble, MD;  [facility]: [facility];  Service: Cardiopulmonary;  Laterality: Bilateral;  . Portacath placement N/A 10/22/2013    Procedure: INSERTION PORT-A-CATH;  Surgeon: [name], MD;  Location: [place];  Service: General;  Laterality: N/A;  . Lung biopsy    . Mastectomy w/ sentinel node biopsy Left 04/23/2014    & axillary LND  . Breast biopsy Left 2015 X 3  . Mastectomy w/ sentinel node biopsy Left 04/23/2014    Procedure: LEFT BREAST MASTECTOMY WITH SENTINEL LYMPH NODE BIOPSY AND AXILLARY LYMPH NODE DISECTION;  Surgeon: [name], MD;  Location: [place];  Service: General;  Laterality: Left;  . Thyroid lobectomy Left 04/23/2014    Procedure: LEFT THYROID LOBECTOMY;  Surgeon: [name], MD;  Location: [place];  Service: General;  Laterality: Left;    There were no vitals  taken for this visit.  Visit Diagnosis:  Contracture of shoulder, left  Lymphedema syndrome, postmastectomy      Subjective Assessment - 06/10/14 1437    Symptoms Got my new bra and prosthesis, it feels very comfortable. Lt trunk swelling feels about the same, and Lt shoulder is just tight. Had my radiation simulation today, I was able to hold my arm up for the entire time, its getting better!   Currently in Pain? No/denies          [facility] PT Assessment - 06/10/14 0001    AROM   Left Shoulder Flexion 137 Degrees   Left Shoulder ABduction 128 Degrees                  [facility] Adult PT Treatment/Exercise - 06/10/14 0001    Shoulder Exercises: Pulleys   Flexion 3 minutes   Flexion Limitations Cuing throughout to decrease scapular compensation   ABduction 2 minutes   Shoulder Exercises: Therapy Ball   Flexion 10 reps  Roll yellow up ball   Shoulder Exercises: Stretch   Wall Stretch - ABduction 5 reps  With finger ladder   Wall Stretch - ABduction Limitations Pt able to correct scapular compensation throughout.   Manual Therapy   Manual Lymphatic Drainage (MLD) Lt inguinal nodes, and Lt axillo-inguinal  anastomosis focusing on lateral trunk edema.   Passive ROM To Lt shoulder into flexion, abductiona and abduction with ER for radiation positioning, and D2 and neural tension stretch.      All stretching to pts tolerance.             Short Term Clinic Goals - 06/04/14 1418    CC Short Term Goal  #1   Title short term goals=long term goals             Long Term Clinic Goals - 06/10/14 1528    CC Long Term Goal  #1   Title pt will verbalize understanding of lymphedema risk reduction practices and use of compression to manage edema   Status On-going   CC Long Term Goal  #3   Title pt will be independent in a home exercise program    Status On-going   CC Long Term Goal  #4   Title pt will state edema in left shoulder/axilla has decreased by 50%   Status  On-going            Plan - 06/10/14 1515    Clinical Impression Statement Pt has already made great improvements with ROM. Has been very compliant with initial HEP and was able to get into position for radation simulation today. PT sent pts info to [employer] for a sleeve/glove and maybe something for her trunk swelling.   Pt will benefit from skilled therapeutic intervention in order to improve on the following deficits Decreased strength;Decreased activity tolerance;Impaired UE functional use;Decreased range of motion;Decreased safety awareness;Decreased skin integrity;Postural dysfunction;Decreased endurance;Increased edema   Rehab Potential Good   PT Frequency 1x / week   PT Duration 3 weeks   PT Next Visit Plan Cont AA/PROM to Lt shoulder, including radiation position.   PT Home Exercise Plan Wall slides and cane exercises        Problem List Patient Active Problem List   Diagnosis Date Noted  . Breast cancer 04/23/2014  . Gum symptoms 03/18/2014  . Peripheral neuropathy due to chemotherapy 01/22/2014  . Hot flashes 01/15/2014  . 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 Esophageal reflux 11/27/2013  . 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 Recurrent genital herpes 11/06/2013  . Chemotherapy induced neutropenia 11/06/2013  . Anemia, unspecified 11/06/2013  . Diabetes mellitus type II, non insulin dependent 11/06/2013  . Left thyroid nodule 11/06/2013  . Sarcoidosis 10/15/2013  . Breast cancer of upper-outer quadrant of left female breast 10/10/2013  . Mediastinal lymphadenopathy 11/21/2012  . Tobacco use disorder 11/21/2012    Otelia Limes, PTA 06/10/2014, 3:35 PM  [place], [place], [address] Phone: [phone]   Fax:  [phone]

## 2014-06-11 ENCOUNTER — Ambulatory Visit: Payer: Medicaid Other

## 2014-06-12 ENCOUNTER — Ambulatory Visit: Payer: Medicaid Other

## 2014-06-15 ENCOUNTER — Ambulatory Visit: Payer: Medicaid Other

## 2014-06-16 ENCOUNTER — Ambulatory Visit: Payer: Medicaid Other

## 2014-06-17 ENCOUNTER — Ambulatory Visit: Payer: Medicaid Other

## 2014-06-17 DIAGNOSIS — Z510 Encounter for antineoplastic radiation therapy: Secondary | ICD-10-CM | POA: Diagnosis not present

## 2014-06-17 DIAGNOSIS — M24512 Contracture, left shoulder: Secondary | ICD-10-CM

## 2014-06-17 DIAGNOSIS — I972 Postmastectomy lymphedema syndrome: Secondary | ICD-10-CM

## 2014-06-17 NOTE — Therapy (Signed)
[place], [place], [address] Phone: [phone]   Fax:  [phone]  Physical Therapy Treatment  Patient Details  Name: Shelly Hall MRN: [id number] Date of Birth: [birth year] Referring Provider:  Kerin Perna, NP  Encounter Date: 06/17/2014      PT End of Session - 06/17/14 1517    Visit Number 3   Number of Visits 4   Date for PT Re-Evaluation 06/24/14   PT Start Time 2423   PT Stop Time 1516   PT Time Calculation (min) 45 min      Past Medical History  Diagnosis Date  . Hypertension   . Sarcoidosis   . Anxiety   . Depression   . Anemia   . Complication of anesthesia     makes pt. restless and unable to be still  . High cholesterol   . Heart murmur   . Thyroid cancer 2015  . Type II diabetes mellitus   . Migraine     "in the past; none in years" (04/23/2014)  . Breast cancer 2015    left upper outer;  had chemo    Past Surgical History  Procedure Laterality Date  . Tubal ligation  2001  . Endobronchial ultrasound Bilateral 12/02/2012    Procedure: ENDOBRONCHIAL ULTRASOUND;  Surgeon: Collene Gobble, MD;  [facility];  Service: Cardiopulmonary;  Laterality: Bilateral;  . Portacath placement N/A 10/22/2013    Procedure: INSERTION PORT-A-CATH;  Surgeon: [name], MD;  Location: [facility];  Service: General;  Laterality: N/A;  . Lung biopsy    . Mastectomy w/ sentinel node biopsy Left 04/23/2014    & axillary LND  . Breast biopsy Left 2015 X 3  . Mastectomy w/ sentinel node biopsy Left 04/23/2014    Procedure: LEFT BREAST MASTECTOMY WITH SENTINEL LYMPH NODE BIOPSY AND AXILLARY LYMPH NODE DISECTION;  Surgeon: [name], MD;  Location: [facility];  Service: General;  Laterality: Left;  . Thyroid lobectomy Left 04/23/2014    Procedure: LEFT THYROID LOBECTOMY;  Surgeon: [name], MD;  Location: [facility];  Service: General;  Laterality: Left;    There were no vitals  taken for this visit.  Visit Diagnosis:  Contracture of shoulder, left  Lymphedema syndrome, postmastectomy      Subjective Assessment - 06/17/14 1434    Symptoms Been having some achiness in my Lt arm since Monday, not sure why. Started in upper arm, but now whole arm is achy. Just got my gym membership today at [employer], plan on going to walk every day to build up my endurance then will start with strengthening.   Currently in Pain? Yes   Pain Score 4    Pain Location Arm   Pain Orientation Left   Pain Descriptors / Indicators Aching   Aggravating Factors  Not sure what started it   Pain Relieving Factors Pain pill helped pt to sleep Monday night.                    [facility] Adult PT Treatment/Exercise - 06/17/14 0001    Shoulder Exercises: Pulleys   Flexion 2 minutes   ABduction 2 minutes   Shoulder Exercises: Therapy Ball   Flexion 10 reps  Roll yellow ball up wall   Shoulder Exercises: Stretch   Wall Stretch - ABduction 5 reps  Finger Ladder up to 25   Wall Stretch - ABduction Limitations Pt with less scapular compensation   Other Shoulder  Stretches Stretches from [employer] Program: Bil chest, shoulder, tricep, calf, quadriceps, seated hamstrings and figure 4 piriformis, and supine low trunk rotation.   Other Shoulder Stretches Strength ABC Program: Supine Bridges, S/L clam, and Superwoman, and chest press 1#, then standing for squats, standing "W", bil hip abduction, bil scaption against the wall 1#, 6" step ups, tricep kickbacks 1#, calf raises, and bicep curls 1# all 5 reps due to time                PT Education - 06/17/14 1517    Education provided Yes   Education Details Strength ABC Program and to do only 2x/wk due to lymphatic load.   Person(s) Educated Patient   Methods Explanation;Demonstration;Handout;Verbal cues   Comprehension Verbalized understanding;Returned demonstration;Verbal cues required           Short Term Clinic  Goals - 06/04/14 1418    CC Short Term Goal  #1   Title short term goals=long term goals             Long Term Clinic Goals - 06/17/14 1520    CC Long Term Goal  #1   Title pt will verbalize understanding of lymphedema risk reduction practices and use of compression to manage edema   Status On-going   CC Long Term Goal  #2   Title pt will have 140 degrees of shoulder abduction so that she can receive radiation therapy    Status On-going   CC Long Term Goal  #3   Title pt will be independent in a home exercise program    Status On-going   CC Long Term Goal  #4   Title pt will state edema in left shoulder/axilla has decreased by 50%   Status On-going            Plan - 06/17/14 1517    Clinical Impression Statement Instructed pt in Strength ABC Program as she wants to become more active without increasing her risk of lymphedema. Pt demonstrated good understanding of exercises and of lymphatic load   Pt will benefit from skilled therapeutic intervention in order to improve on the following deficits Decreased strength;Decreased activity tolerance;Impaired UE functional use;Decreased range of motion;Decreased safety awareness;Decreased skin integrity;Postural dysfunction;Decreased endurance;Increased edema   Rehab Potential Good   PT Frequency 1x / week   PT Duration 3 weeks   PT Next Visit Plan Cont AA/PROM to Lt shoulder, including radiation position. [place] ROM next visit, assess goals and discharge due to Medicaid.   PT Home Exercise Plan Strength ABC Program and walking at the gym.   Consulted and Agree with Plan of Care Patient        Problem List Patient Active Problem List   Diagnosis Date Noted  . Breast cancer 04/23/2014  . Gum symptoms 03/18/2014  . Peripheral neuropathy due to chemotherapy 01/22/2014  . Hot flashes 01/15/2014  . 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 Esophageal reflux 11/27/2013  . 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 Recurrent  genital herpes 11/06/2013  . Chemotherapy induced neutropenia 11/06/2013  . Anemia, unspecified 11/06/2013  . Diabetes mellitus type II, non insulin dependent 11/06/2013  . Left thyroid nodule 11/06/2013  . Sarcoidosis 10/15/2013  . Breast cancer of upper-outer quadrant of left female breast 10/10/2013  . Mediastinal lymphadenopathy 11/21/2012  . Tobacco use disorder 11/21/2012    Otelia Limes, PTA 06/17/2014, 3:22 PM  [place], [place], [address]  Phone: [phone]   Fax:  [phone]

## 2014-06-18 ENCOUNTER — Ambulatory Visit: Payer: Medicaid Other

## 2014-06-19 ENCOUNTER — Ambulatory Visit: Admission: RE | Admit: 2014-06-19 | Payer: Medicaid Other | Source: Ambulatory Visit | Admitting: Radiation Oncology

## 2014-06-19 ENCOUNTER — Ambulatory Visit: Payer: Medicaid Other

## 2014-06-19 DIAGNOSIS — Z510 Encounter for antineoplastic radiation therapy: Secondary | ICD-10-CM | POA: Diagnosis not present

## 2014-06-22 ENCOUNTER — Ambulatory Visit: Payer: Medicaid Other

## 2014-06-22 ENCOUNTER — Ambulatory Visit
Admission: RE | Admit: 2014-06-22 | Discharge: 2014-06-22 | Disposition: A | Discharge status: Home / Self Care | Payer: Medicaid Other | Source: Ambulatory Visit | Attending: Radiation Oncology | Admitting: Radiation Oncology

## 2014-06-22 DIAGNOSIS — Z510 Encounter for antineoplastic radiation therapy: Secondary | ICD-10-CM | POA: Diagnosis not present

## 2014-06-23 ENCOUNTER — Ambulatory Visit: Payer: Medicaid Other

## 2014-06-23 ENCOUNTER — Encounter: Payer: Self-pay | Admitting: Radiation Oncology

## 2014-06-23 ENCOUNTER — Ambulatory Visit
Admission: RE | Admit: 2014-06-23 | Discharge: 2014-06-23 | Disposition: A | Discharge status: Home / Self Care | Payer: Medicaid Other | Source: Ambulatory Visit | Attending: Radiation Oncology | Admitting: Radiation Oncology

## 2014-06-23 VITALS — BP 128/85 | HR 78 | Temp 98.0°F | Resp 18 | Wt 183.2 lb

## 2014-06-23 DIAGNOSIS — C50412 Malignant neoplasm of upper-outer quadrant of left female breast: Secondary | ICD-10-CM | POA: Diagnosis present

## 2014-06-23 DIAGNOSIS — Z510 Encounter for antineoplastic radiation therapy: Secondary | ICD-10-CM | POA: Diagnosis not present

## 2014-06-23 MED ORDER — ALRA NON-METALLIC DEODORANT (RAD-ONC)
1.0000 "application " | Freq: Once | TOPICAL | Status: AC
  Administered 2014-06-23: 1 via TOPICAL

## 2014-06-23 MED ORDER — RADIAPLEXRX EX GEL
Freq: Once | CUTANEOUS | Status: AC
  Administered 2014-06-23: 15:00:00 via TOPICAL

## 2014-06-23 NOTE — Progress Notes (Signed)
Weekly Management Note Current Dose: 1.8  Gy  Projected Dose: 60.4 Gy   Narrative:  The patient presents for routine under treatment assessment.  CBCT/MVCT images/Port film x-rays were reviewed.  The chart was checked. Doing well. No complaints.   Physical Findings: Weight: 183 lb 3.2 oz (83.099 kg). Unchanged  Impression:  The patient is tolerating radiation.  Plan:  Continue treatment as planned. Discussed gym and weight loss and fatigue.  She is already doing all that. Continue radiaplex.

## 2014-06-24 ENCOUNTER — Ambulatory Visit: Payer: Medicaid Other

## 2014-06-24 ENCOUNTER — Ambulatory Visit
Admission: RE | Admit: 2014-06-24 | Discharge: 2014-06-24 | Disposition: A | Discharge status: Home / Self Care | Payer: Medicaid Other | Source: Ambulatory Visit | Attending: Radiation Oncology | Admitting: Radiation Oncology

## 2014-06-24 DIAGNOSIS — I972 Postmastectomy lymphedema syndrome: Secondary | ICD-10-CM

## 2014-06-24 DIAGNOSIS — M24512 Contracture, left shoulder: Secondary | ICD-10-CM

## 2014-06-24 DIAGNOSIS — Z510 Encounter for antineoplastic radiation therapy: Secondary | ICD-10-CM | POA: Diagnosis not present

## 2014-06-24 NOTE — Therapy (Signed)
[place], [place], [address] Phone: [phone]   Fax:  [phone]  Physical Therapy Treatment  Patient Details  Name: Shelly Hall MRN: [id number] Date of Birth: 05/11/1971 Referring Provider:  Kerin Perna, NP  Encounter Date: 06/24/2014      PT End of Session - 06/24/14 1523    Visit Number 4   Number of Visits 4   Date for PT Re-Evaluation 06/24/14   PT Start Time 9622   PT Stop Time 1522   PT Time Calculation (min) 40 min      Past Medical History  Diagnosis Date  . Hypertension   . Sarcoidosis   . Anxiety   . Depression   . Anemia   . Complication of anesthesia     makes pt. restless and unable to be still  . High cholesterol   . Heart murmur   . Thyroid cancer 2015  . Type II diabetes mellitus   . Migraine     "in the past; none in years" (04/23/2014)  . Breast cancer 2015    left upper outer;  had chemo    Past Surgical History  Procedure Laterality Date  . Tubal ligation  2001  . Endobronchial ultrasound Bilateral 12/02/2012    Procedure: ENDOBRONCHIAL ULTRASOUND;  Surgeon: Collene Gobble, MD;  Location: [facility];  Service: Cardiopulmonary;  Laterality: Bilateral;  . Portacath placement N/A 10/22/2013    Procedure: INSERTION PORT-A-CATH;  Surgeon: [name], MD;  Location: [place];  Service: General;  Laterality: N/A;  . Lung biopsy    . Mastectomy w/ sentinel node biopsy Left 04/23/2014    & axillary LND  . Breast biopsy Left 2015 X 3  . Mastectomy w/ sentinel node biopsy Left 04/23/2014    Procedure: LEFT BREAST MASTECTOMY WITH SENTINEL LYMPH NODE BIOPSY AND AXILLARY LYMPH NODE DISECTION;  Surgeon: [name], MD;  Location: [place];  Service: General;  Laterality: Left;  . Thyroid lobectomy Left 04/23/2014    Procedure: LEFT THYROID LOBECTOMY;  Surgeon: [name], MD;  Location: [place];  Service: General;  Laterality: Left;    There were no vitals  taken for this visit.  Visit Diagnosis:  Contracture of shoulder, left  Lymphedema syndrome, postmastectomy      Subjective Assessment - 06/24/14 1444    Symptoms The stretches have been helping the achiness in my arm I was experiencing. Been doing the HEP from last time, no problems with that.          [facility] PT Assessment - 06/24/14 0001    AROM   Left Shoulder Flexion 145 Degrees   Left Shoulder ABduction 136 Degrees                  [facility] Adult PT Treatment/Exercise - 06/24/14 0001    Shoulder Exercises: Pulleys   Flexion 2 minutes   ABduction 2 minutes   Shoulder Exercises: Therapy Ball   Flexion 10 reps  Roll yellow ball up wall   Shoulder Exercises: Stretch   Corner Stretch 3 reps;10 seconds   External Rotation Stretch 5 reps  5 seconds, standing against wall sliding arms up wall   Wall Stretch - ABduction --  10 reps, Finger Ladder to 25   Wall Stretch - ABduction Limitations No scapular compensation   Manual Therapy   Manual Lymphatic Drainage (MLD) In supine: Lt inguinal nodes, and Lt axillo-inguinal anastomosis instructing pt throughout   Passive ROM To Lt  shoulder into flexion, abduction, and D2 all to pts tolerance.                 PT Education - 06/24/14 1523    Education provided Yes   Education Details Manual lymph drainage to lateral trunk   Person(s) Educated Patient   Methods Explanation;Demonstration   Comprehension Verbalized understanding;Returned demonstration           Short Term Clinic Goals - 06/04/14 1418    CC Short Term Goal  #1   Title short term goals=long term goals             Long Term Clinic Goals - 06/24/14 1528    CC Long Term Goal  #1   Title pt will verbalize understanding of lymphedema risk reduction practices and use of compression to manage edema   Status Achieved   CC Long Term Goal  #2   Title pt will have 140 degrees of shoulder abduction so that she can receive radiation therapy   136  degrees   Status Partially Met   CC Long Term Goal  #3   Title pt will be independent in a home exercise program    Status Achieved   CC Long Term Goal  #4   Title pt will state edema in left shoulder/axilla has decreased by 50%  75% improvement reported   Status Achieved            Plan - 06/24/14 1523    Clinical Impression Statement Pt had good improvements with AROM and is compliant with HEP. Also instructed in manual lymph drainage today and returned good demo of that.   Pt will benefit from skilled therapeutic intervention in order to improve on the following deficits Decreased strength;Decreased activity tolerance;Impaired UE functional use;Decreased range of motion;Decreased safety awareness;Decreased skin integrity;Postural dysfunction;Decreased endurance;Increased edema   Rehab Potential Good   Clinical Impairments Affecting Rehab Potential delayed healing   PT Frequency 1x / week   PT Duration 3 weeks   PT Next Visit Plan Discharge visit.   PT Home Exercise Plan Strength ABC Program and walking at the gym. Pulleys at home        Problem List Patient Active Problem List   Diagnosis Date Noted  . Breast cancer 04/23/2014  . Gum symptoms 03/18/2014  . Peripheral neuropathy due to chemotherapy 01/22/2014  . Hot flashes 01/15/2014  . 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 Esophageal reflux 11/27/2013  . 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 Recurrent genital herpes 11/06/2013  . Chemotherapy induced neutropenia 11/06/2013  . Anemia, unspecified 11/06/2013  . Diabetes mellitus type II, non insulin dependent 11/06/2013  . Left thyroid nodule 11/06/2013  . Sarcoidosis 10/15/2013  . Breast cancer of upper-outer quadrant of left female breast 10/10/2013  . Mediastinal lymphadenopathy 11/21/2012  . Tobacco use disorder 11/21/2012    Otelia Limes, PTA 06/24/2014, 3:32 PM  [place], [place], [address] Phone: [phone]   Fax:  [phone]

## 2014-06-25 ENCOUNTER — Ambulatory Visit: Payer: Medicaid Other

## 2014-06-25 ENCOUNTER — Ambulatory Visit
Admission: RE | Admit: 2014-06-25 | Discharge: 2014-06-25 | Disposition: A | Discharge status: Home / Self Care | Payer: Medicaid Other | Source: Ambulatory Visit | Attending: Radiation Oncology | Admitting: Radiation Oncology

## 2014-06-25 DIAGNOSIS — Z510 Encounter for antineoplastic radiation therapy: Secondary | ICD-10-CM | POA: Diagnosis not present

## 2014-06-26 ENCOUNTER — Ambulatory Visit: Payer: Medicaid Other

## 2014-06-29 ENCOUNTER — Ambulatory Visit: Payer: Medicaid Other

## 2014-06-29 ENCOUNTER — Ambulatory Visit
Admission: RE | Admit: 2014-06-29 | Discharge: 2014-06-29 | Disposition: A | Discharge status: Home / Self Care | Payer: Medicaid Other | Source: Ambulatory Visit | Attending: Radiation Oncology | Admitting: Radiation Oncology

## 2014-06-29 DIAGNOSIS — Z510 Encounter for antineoplastic radiation therapy: Secondary | ICD-10-CM | POA: Diagnosis not present

## 2014-06-30 ENCOUNTER — Ambulatory Visit
Admission: RE | Admit: 2014-06-30 | Discharge: 2014-06-30 | Disposition: A | Discharge status: Home / Self Care | Payer: Medicaid Other | Source: Ambulatory Visit | Attending: Radiation Oncology | Admitting: Radiation Oncology

## 2014-06-30 ENCOUNTER — Ambulatory Visit: Payer: Medicaid Other

## 2014-06-30 VITALS — BP 119/74 | HR 86 | Temp 97.8°F | Wt 184.6 lb

## 2014-06-30 DIAGNOSIS — C50412 Malignant neoplasm of upper-outer quadrant of left female breast: Secondary | ICD-10-CM

## 2014-06-30 DIAGNOSIS — Z510 Encounter for antineoplastic radiation therapy: Secondary | ICD-10-CM | POA: Diagnosis not present

## 2014-06-30 NOTE — Progress Notes (Signed)
Weekly assessment of radiation to left chest wall and supraclavicular region.Completed 5 of 28 fractions.No skin changes.has new script for ambien to pick up tomorrow.Routien and side effects of radiation treatment reinforced.

## 2014-06-30 NOTE — Progress Notes (Signed)
Weekly Management Note Current Dose: 9  Gy  Projected Dose: 60.4 Gy   Narrative:  The patient presents for routine under treatment assessment.  CBCT/MVCT images/Port film x-rays were reviewed.  The chart was checked. No complaints. Received script for ambien from pcp.   Physical Findings: Weight: 184 lb 9.6 oz (83.734 kg). Unchanged  Impression:  The patient is tolerating radiation.  Plan:  Continue treatment as planned. Discussed sleep hygiene.

## 2014-07-01 ENCOUNTER — Ambulatory Visit: Payer: Medicaid Other

## 2014-07-01 ENCOUNTER — Ambulatory Visit
Admission: RE | Admit: 2014-07-01 | Discharge: 2014-07-01 | Disposition: A | Discharge status: Home / Self Care | Payer: Medicaid Other | Source: Ambulatory Visit | Attending: Radiation Oncology | Admitting: Radiation Oncology

## 2014-07-01 DIAGNOSIS — Z510 Encounter for antineoplastic radiation therapy: Secondary | ICD-10-CM | POA: Diagnosis not present

## 2014-07-02 ENCOUNTER — Ambulatory Visit: Payer: Medicaid Other

## 2014-07-02 ENCOUNTER — Ambulatory Visit
Admission: RE | Admit: 2014-07-02 | Discharge: 2014-07-02 | Disposition: A | Discharge status: Home / Self Care | Payer: Medicaid Other | Source: Ambulatory Visit | Attending: Radiation Oncology | Admitting: Radiation Oncology

## 2014-07-02 DIAGNOSIS — Z510 Encounter for antineoplastic radiation therapy: Secondary | ICD-10-CM | POA: Diagnosis not present

## 2014-07-03 ENCOUNTER — Ambulatory Visit
Admission: RE | Admit: 2014-07-03 | Discharge: 2014-07-03 | Disposition: A | Discharge status: Home / Self Care | Payer: Medicaid Other | Source: Ambulatory Visit | Attending: Radiation Oncology | Admitting: Radiation Oncology

## 2014-07-03 ENCOUNTER — Ambulatory Visit: Payer: Medicaid Other

## 2014-07-03 DIAGNOSIS — Z510 Encounter for antineoplastic radiation therapy: Secondary | ICD-10-CM | POA: Diagnosis not present

## 2014-07-06 ENCOUNTER — Ambulatory Visit (HOSPITAL_BASED_OUTPATIENT_CLINIC_OR_DEPARTMENT_OTHER): Payer: Medicaid Other

## 2014-07-06 ENCOUNTER — Ambulatory Visit: Payer: Medicaid Other

## 2014-07-06 ENCOUNTER — Ambulatory Visit
Admission: RE | Admit: 2014-07-06 | Discharge: 2014-07-06 | Disposition: A | Discharge status: Home / Self Care | Payer: Medicaid Other | Source: Ambulatory Visit | Attending: Radiation Oncology | Admitting: Radiation Oncology

## 2014-07-06 VITALS — BP 122/91 | HR 86 | Temp 98.2°F

## 2014-07-06 DIAGNOSIS — C50812 Malignant neoplasm of overlapping sites of left female breast: Secondary | ICD-10-CM

## 2014-07-06 DIAGNOSIS — Z452 Encounter for adjustment and management of vascular access device: Secondary | ICD-10-CM

## 2014-07-06 DIAGNOSIS — Z510 Encounter for antineoplastic radiation therapy: Secondary | ICD-10-CM | POA: Diagnosis not present

## 2014-07-06 DIAGNOSIS — C50412 Malignant neoplasm of upper-outer quadrant of left female breast: Secondary | ICD-10-CM

## 2014-07-06 DIAGNOSIS — Z95828 Presence of other vascular implants and grafts: Secondary | ICD-10-CM

## 2014-07-06 MED ORDER — SODIUM CHLORIDE 0.9 % IJ SOLN
10.0000 mL | INTRAMUSCULAR | Status: DC | PRN
  Administered 2014-07-06: 10 mL via INTRAVENOUS
  Filled 2014-07-06: qty 10

## 2014-07-06 MED ORDER — HEPARIN SOD (PORK) LOCK FLUSH 100 UNIT/ML IV SOLN
500.0000 [IU] | Freq: Once | INTRAVENOUS | Status: AC
  Administered 2014-07-06: 500 [IU] via INTRAVENOUS
  Filled 2014-07-06: qty 5

## 2014-07-06 NOTE — Patient Instructions (Signed)

## 2014-07-07 ENCOUNTER — Ambulatory Visit: Payer: Medicaid Other

## 2014-07-07 ENCOUNTER — Ambulatory Visit
Admission: RE | Admit: 2014-07-07 | Discharge: 2014-07-07 | Disposition: A | Discharge status: Home / Self Care | Payer: Medicaid Other | Source: Ambulatory Visit | Attending: Radiation Oncology | Admitting: Radiation Oncology

## 2014-07-07 DIAGNOSIS — Z510 Encounter for antineoplastic radiation therapy: Secondary | ICD-10-CM | POA: Diagnosis not present

## 2014-07-08 ENCOUNTER — Ambulatory Visit: Payer: Medicaid Other

## 2014-07-08 ENCOUNTER — Ambulatory Visit
Admission: RE | Admit: 2014-07-08 | Discharge: 2014-07-08 | Disposition: A | Discharge status: Home / Self Care | Payer: Medicaid Other | Source: Ambulatory Visit | Attending: Radiation Oncology | Admitting: Radiation Oncology

## 2014-07-08 ENCOUNTER — Encounter: Payer: Self-pay | Admitting: Radiation Oncology

## 2014-07-08 ENCOUNTER — Telehealth: Payer: Self-pay

## 2014-07-08 VITALS — BP 118/83 | HR 90 | Temp 97.7°F | Resp 12 | Wt 183.6 lb

## 2014-07-08 DIAGNOSIS — Z510 Encounter for antineoplastic radiation therapy: Secondary | ICD-10-CM | POA: Diagnosis not present

## 2014-07-08 DIAGNOSIS — C50412 Malignant neoplasm of upper-outer quadrant of left female breast: Secondary | ICD-10-CM

## 2014-07-08 NOTE — Telephone Encounter (Signed)
Left a message for Lattie Haw that the order is on Dr. Virgie Dad desk waiting to be signed as he has been out of the [place] for 2-1/2 weeks.  He returns tomorrow 07-09-14.  If you do not receive the signed order by 07-10-14, please call back to the [facility] at [phone].

## 2014-07-08 NOTE — Progress Notes (Signed)
She rates her pain as a 7 on a scale of 0-10. constant, dull and "strong" over left arm. Pt complains of Loss of Sleep, Fatigue and Generalized Weakness.  Pt left breast- Hyperpigmentation and breast tenderness.  Pt reports edema and left upper extremity. Pt continues to apply Radiaplex as directed. BP 118/83 mmHg  Pulse 90  Temp(Src) 97.7 F (36.5 C) (Oral)  Resp 12  Wt 183 lb 9.6 oz (83.28 kg)  SpO2 100%

## 2014-07-08 NOTE — Progress Notes (Signed)
Weekly Management Note Current Dose:  19.8 Gy  Projected Dose: 60.4 Gy   Narrative:  The patient presents for routine under treatment assessment.  CBCT/MVCT images/Port film x-rays were reviewed.  The chart was checked. Using sleeve for left arm pain. Better with sleeve on. Haing some difficulties with Medicaid and Ambien prescription.   Physical Findings: Weight: 183 lb 9.6 oz (83.28 kg). Unchanged. Minimal skin changes over left chest wall.   Impression:  The patient is tolerating radiation.  Plan:  Continue treatment as planned. Continue RT. Continue sleeve. If not resolved by Friday, can ask financial advocates to help.

## 2014-07-09 ENCOUNTER — Ambulatory Visit
Admission: RE | Admit: 2014-07-09 | Discharge: 2014-07-09 | Disposition: A | Discharge status: Home / Self Care | Payer: Medicaid Other | Source: Ambulatory Visit | Attending: Radiation Oncology | Admitting: Radiation Oncology

## 2014-07-09 ENCOUNTER — Ambulatory Visit: Payer: Medicaid Other

## 2014-07-09 DIAGNOSIS — Z510 Encounter for antineoplastic radiation therapy: Secondary | ICD-10-CM | POA: Diagnosis not present

## 2014-07-10 ENCOUNTER — Ambulatory Visit: Payer: Medicaid Other

## 2014-07-10 ENCOUNTER — Ambulatory Visit
Admission: RE | Admit: 2014-07-10 | Discharge: 2014-07-10 | Disposition: A | Discharge status: Home / Self Care | Payer: Medicaid Other | Source: Ambulatory Visit | Attending: Radiation Oncology | Admitting: Radiation Oncology

## 2014-07-10 DIAGNOSIS — Z510 Encounter for antineoplastic radiation therapy: Secondary | ICD-10-CM | POA: Diagnosis not present

## 2014-07-13 ENCOUNTER — Ambulatory Visit: Payer: Medicaid Other

## 2014-07-13 ENCOUNTER — Ambulatory Visit
Admission: RE | Admit: 2014-07-13 | Discharge: 2014-07-13 | Disposition: A | Discharge status: Home / Self Care | Payer: Medicaid Other | Source: Ambulatory Visit | Attending: Radiation Oncology | Admitting: Radiation Oncology

## 2014-07-13 DIAGNOSIS — Z510 Encounter for antineoplastic radiation therapy: Secondary | ICD-10-CM | POA: Diagnosis not present

## 2014-07-14 ENCOUNTER — Ambulatory Visit
Admission: RE | Admit: 2014-07-14 | Discharge: 2014-07-14 | Disposition: A | Discharge status: Home / Self Care | Payer: Medicaid Other | Source: Ambulatory Visit | Attending: Radiation Oncology | Admitting: Radiation Oncology

## 2014-07-14 ENCOUNTER — Ambulatory Visit: Payer: Medicaid Other

## 2014-07-14 VITALS — BP 127/92 | HR 85 | Temp 97.8°F | Wt 184.1 lb

## 2014-07-14 DIAGNOSIS — C50412 Malignant neoplasm of upper-outer quadrant of left female breast: Secondary | ICD-10-CM

## 2014-07-14 DIAGNOSIS — Z510 Encounter for antineoplastic radiation therapy: Secondary | ICD-10-CM | POA: Diagnosis not present

## 2014-07-14 MED ORDER — ZOLPIDEM TARTRATE 5 MG PO TABS: 5.0000 mg | ORAL_TABLET | Freq: Every evening | ORAL | Status: DC | PRN

## 2014-07-14 NOTE — Progress Notes (Signed)
Weekly Management Note Current Dose: 27  Gy  Projected Dose: 60.4 Gy   Narrative:  The patient presents for routine under treatment assessment.  CBCT/MVCT images/Port film x-rays were reviewed.  The chart was checked. Doing well. More fatigue. Needs refill on Ambien. Wakes up not feeling rested. Keeps her grandchild during the day.   Physical Findings: Weight: 184 lb 1.6 oz (83.507 kg). Unchanged- minimal hyperpigmentation over left chest wall.   Impression:  The patient is tolerating radiation.  Plan:  Continue treatment as planned. Gave 1 time script for 30 tablets of Ambien.  Discussed fatigue that does not respond to rest as a side effect of chemo. Discussed coping/she is working with a Clinical research associate.

## 2014-07-14 NOTE — Progress Notes (Signed)
Weekly assessment of radiation to left chestwall.Completed 15 of 28 fractions.Mild discoloration.Has some soreness, stiffness and fatigue.Hasn't worked out in a few days.

## 2014-07-15 ENCOUNTER — Ambulatory Visit
Admission: RE | Admit: 2014-07-15 | Discharge: 2014-07-15 | Disposition: A | Discharge status: Home / Self Care | Payer: Medicaid Other | Source: Ambulatory Visit | Attending: Radiation Oncology | Admitting: Radiation Oncology

## 2014-07-15 ENCOUNTER — Ambulatory Visit: Payer: Medicaid Other

## 2014-07-15 DIAGNOSIS — Z510 Encounter for antineoplastic radiation therapy: Secondary | ICD-10-CM | POA: Diagnosis not present

## 2014-07-16 ENCOUNTER — Other Ambulatory Visit (HOSPITAL_BASED_OUTPATIENT_CLINIC_OR_DEPARTMENT_OTHER): Payer: Medicaid Other

## 2014-07-16 ENCOUNTER — Ambulatory Visit (HOSPITAL_BASED_OUTPATIENT_CLINIC_OR_DEPARTMENT_OTHER): Payer: Medicaid Other | Admitting: Oncology

## 2014-07-16 ENCOUNTER — Ambulatory Visit: Payer: Medicaid Other

## 2014-07-16 ENCOUNTER — Telehealth: Payer: Self-pay | Admitting: Oncology

## 2014-07-16 ENCOUNTER — Ambulatory Visit
Admission: RE | Admit: 2014-07-16 | Discharge: 2014-07-16 | Disposition: A | Discharge status: Home / Self Care | Payer: Medicaid Other | Source: Ambulatory Visit | Attending: Radiation Oncology | Admitting: Radiation Oncology

## 2014-07-16 VITALS — BP 139/80 | HR 90 | Temp 97.4°F | Resp 18 | Ht 63.0 in | Wt 186.8 lb

## 2014-07-16 DIAGNOSIS — Z170 Estrogen receptor positive status [ER+]: Secondary | ICD-10-CM

## 2014-07-16 DIAGNOSIS — C50412 Malignant neoplasm of upper-outer quadrant of left female breast: Secondary | ICD-10-CM

## 2014-07-16 DIAGNOSIS — C773 Secondary and unspecified malignant neoplasm of axilla and upper limb lymph nodes: Secondary | ICD-10-CM

## 2014-07-16 DIAGNOSIS — Z510 Encounter for antineoplastic radiation therapy: Secondary | ICD-10-CM | POA: Diagnosis not present

## 2014-07-16 DIAGNOSIS — F17200 Nicotine dependence, unspecified, uncomplicated: Secondary | ICD-10-CM

## 2014-07-16 DIAGNOSIS — D869 Sarcoidosis, unspecified: Secondary | ICD-10-CM

## 2014-07-16 LAB — CBC WITH DIFFERENTIAL/PLATELET
BASO%: 0.3 % (ref 0.0–2.0)
BASOS ABS: 0 10*3/uL (ref 0.0–0.1)
EOS%: 4.1 % (ref 0.0–7.0)
Eosinophils Absolute: 0.2 10*3/uL (ref 0.0–0.5)
HEMATOCRIT: 34.2 % — AB (ref 34.8–46.6)
HEMOGLOBIN: 11.4 g/dL — AB (ref 11.6–15.9)
LYMPH%: 16.2 % (ref 14.0–49.7)
MCH: 30.3 pg (ref 25.1–34.0)
MCHC: 33.3 g/dL (ref 31.5–36.0)
MCV: 91 fL (ref 79.5–101.0)
MONO#: 0.5 10*3/uL (ref 0.1–0.9)
MONO%: 11.4 % (ref 0.0–14.0)
NEUT#: 2.7 10*3/uL (ref 1.5–6.5)
NEUT%: 68 % (ref 38.4–76.8)
Platelets: 214 10*3/uL (ref 145–400)
RBC: 3.76 10*6/uL (ref 3.70–5.45)
RDW: 13.4 % (ref 11.2–14.5)
WBC: 4 10*3/uL (ref 3.9–10.3)
lymph#: 0.6 10*3/uL — ABNORMAL LOW (ref 0.9–3.3)

## 2014-07-16 LAB — COMPREHENSIVE METABOLIC PANEL (CC13)
ALK PHOS: 77 U/L (ref 40–150)
ALT: 30 U/L (ref 0–55)
AST: 29 U/L (ref 5–34)
Albumin: 3.7 g/dL (ref 3.5–5.0)
Anion Gap: 11 mEq/L (ref 3–11)
BILIRUBIN TOTAL: 0.25 mg/dL (ref 0.20–1.20)
BUN: 7.4 mg/dL (ref 7.0–26.0)
CO2: 25 meq/L (ref 22–29)
CREATININE: 0.8 mg/dL (ref 0.6–1.1)
Calcium: 9.6 mg/dL (ref 8.4–10.4)
Chloride: 106 mEq/L (ref 98–109)
EGFR: >90 mL/min/{1.73_m2} (ref >90)
GLUCOSE: 104 mg/dL (ref 70–140)
Potassium: 3.9 mEq/L (ref 3.5–5.1)
Sodium: 142 mEq/L (ref 136–145)
Total Protein: 7.2 g/dL (ref 6.4–8.3)

## 2014-07-16 NOTE — Progress Notes (Signed)
[place]  Telephone:([phone]) [phone] [phone]) [phone]    ID: Shelly Hall OB: 10/17/1970  MR#: [id number]  NWG#:[id number]  PCP: Kerin Perna, NP GYN:  [name] SU: [name] OTHER MD: Thea Silversmith  CHIEF COMPLAINT:  Left Breast Cancer, estrogen receptor positive CURRENT TREATMENT:  adjuvant radiation  BREAST CANCER HISTORY: From the original intake note:  Shelly Hall palpated a mass in her left breast mid-April 2015 and immediately brought it to Dr. Benjie Karvonen' attention. She was set up for bilateral screening mammography with tomography at [facility] 09/19/2013. The possible distortion in the left breast and a nearby group of calcifications was felt to warrant further evaluation, and on 09/30/2013 she underwent unilateral left diagnostic mammography and ultrasonography. There was an irregular mass in the outer left breast associated with microcalcifications. 4 cm anterior to this there was another cluster of pleomorphic calcifications spanning 7 mm. He had more anteriorly there was an irregular mass associated with other calcifications. In total the abnormalities in the left breast spent approximately 10 cm, and is segmental distribution. On exam there was a palpable firm mass at the 2:30 o'clock position of the left breast 10 cm from the nipple. There was palpable adenopathy in the inferior left axilla. Ultrasound confirmed an irregular hypoechoic mass in the left breast measuring 4.6 cm. In addition, a 1.4 cm oval slightly irregular mass was noted at the 3:00 position and yet another mass measured 9 mm in the same area. Ultrasound of the left axilla showed a dominant 3.6 cm lymph node.  Biopsy of 2 of the left breast masses and the suspicious left breast lymph node on 10/03/2013 showed ([id number]) an invasive ductal carcinoma, grade 2, estrogen receptor 90% positive with strong staining intensity, progesterone receptor 40% positive but moderate staining  intensity, with an MIB-1 of 20% and no HER-2 amplification. (Because all 3 biopsies were morphologically identical, only one prognostic panel was sent).  On 10/10/2013 the patient underwent bilateral breast MRI. This showed numerous enhancing masses in the left breast, the largest measuring 3.5 cm, and the aggregate measuring 12.7 cm. There were numerous enlarged left axillary lymph nodes, at least 5 of which were enlarged, both at levels 1 and 2. The largest measured 2.3 cm. The right breast was negative.  The patient's subsequent history is as detailed below.  INTERVAL HISTORY: Shelly Hall returns today for follow up of her breast cancer. She is currently receiving radiation. She is tolerating it well. She does complain of mild fatigue, but she is able get all her activities of daily living done. Mostly she keeps her grandbaby, does some housework and cooking. She has joined [employer].  REVIEW OF SYSTEMS: Shelly Hall has severe insomnia. She is not sure what keeping her up. She was up until 5 in the morning today for example. She does not take naps during the day. She does have hot flashes at night and has been using gabapentin at bedtime, with little relief. She was also started on Ambien recently, again without success. She denies anxiety. Aside from that, she has some back pain which is relatively new. It feels to her like "when I had the Neulasta shot". It is not persistent. Also she is spotting "everyday". She does have a history of fibroids and had been scheduled for hysterectomy before the breast cancer issue raised its head. Aside from those issues, a detailed review of systems today was noncontributory  PAST MEDICAL HISTORY: Past Medical History  Diagnosis Date  . Hypertension   .  Sarcoidosis   . Anxiety   . Depression   . Anemia   . Complication of anesthesia     makes pt. restless and unable to be still  . High cholesterol   . Heart murmur   . Thyroid cancer 2015  . Type II  diabetes mellitus   . Migraine     "in the past; none in years" (04/23/2014)  . Breast cancer 2015    left upper outer;  had chemo    PAST SURGICAL HISTORY: Past Surgical History  Procedure Laterality Date  . Tubal ligation  2001  . Endobronchial ultrasound Bilateral 12/02/2012    Procedure: ENDOBRONCHIAL ULTRASOUND;  Surgeon: Collene Gobble, MD;  Location: [facility];  Service: Cardiopulmonary;  Laterality: Bilateral;  . Portacath placement N/A 10/22/2013    Procedure: INSERTION PORT-A-CATH;  Surgeon: [name], MD;  Location: [place];  Service: General;  Laterality: N/A;  . Lung biopsy    . Mastectomy w/ sentinel node biopsy Left 04/23/2014    & axillary LND  . Breast biopsy Left 2015 X 3  . Mastectomy w/ sentinel node biopsy Left 04/23/2014    Procedure: LEFT BREAST MASTECTOMY WITH SENTINEL LYMPH NODE BIOPSY AND AXILLARY LYMPH NODE DISECTION;  Surgeon: [name], MD;  Location: [place];  Service: General;  Laterality: Left;  . Thyroid lobectomy Left 04/23/2014    Procedure: LEFT THYROID LOBECTOMY;  Surgeon: [name], MD;  Location: [facility];  Service: General;  Laterality: Left;    FAMILY HISTORY Family History  Problem Relation Age of Onset  . Cancer Paternal Aunt     "bone cancer"; deceased 25s  . Cancer Paternal Uncle     stomach cancer; deceased 80s  . Cancer Paternal Aunt     unk. primary; currently 64s  . Prostate cancer Maternal Grandfather 34  . Asthma Mother   The patient's parents are living, in fair mid to late 1s. The patient had one brother, no sisters. There is no history of breast or ovarian cancer in the family to her knowledge.   GYNECOLOGIC HISTORY:   (Reviewed 11/27/2013)  menarche age 5, first live birth at 27. She is GX P2. She was still having regular periods at the time of the start of her chemotherapy in May 2015.  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 She is single and lives alone, although her mother  is currently staying with her. Her son Jolana Runkles lives in [place] and works at [employer]. Son [name] lives in [place] where he works at a [employer]. The patient has 1 grandchild born in Sept 2015, by her son, [name]. She is a Psychologist, forensic.    ADVANCED DIRECTIVES: not in place   HEALTH MAINTENANCE: (Updated 11/27/2013) History  Substance Use Topics  . Smoking status: Current Some Day Smoker -- 0.25 packs/day for 24 years    Types: Cigarettes  . Smokeless tobacco: Never Used     Comment: 04/23/2014 "using vapor; down to 3-4 cigarettes/day now"  . Alcohol Use: 6.0 oz/week    4 Cans of beer, 4 Shots of liquor, 2 Standard drinks or equivalent per week     Colonoscopy: Never  PAP: Not on file  Bone density: Not on file  Lipid panel:  Not on file  Allergies  Allergen Reactions  . Nyquil Multi-Symptom [Pseudoeph-Doxylamine-Dm-Apap] Itching and Other (See Comments)    Skin peels on hands, and feet    Current Outpatient Prescriptions  Medication Sig  Dispense Refill  . albuterol (PROVENTIL HFA;VENTOLIN HFA) 108 (90 BASE) MCG/ACT inhaler Inhale 2 puffs into the lungs every 6 (six) hours as needed for wheezing or shortness of breath.    Marland Kitchen atenolol-chlorthalidone (TENORETIC) 50-25 MG per tablet Take 1 tablet by mouth daily. 30 tablet 1  . docusate sodium (COLACE) 100 MG capsule Take 300 mg by mouth daily.     . ferrous sulfate 325 (65 FE) MG tablet Take 325 mg by mouth 2 (two) times daily with a meal.    . gabapentin (NEURONTIN) 300 MG capsule Take 1 capsule (300 mg total) by mouth at bedtime. 30 capsule 2  . gemfibrozil (LOPID) 600 MG tablet Take 600 mg by mouth 2 (two) times daily before a meal.    . lisinopril (PRINIVIL,ZESTRIL) 10 MG tablet Take 10 mg by mouth daily.    Marland Kitchen LORazepam (ATIVAN) 0.5 MG tablet Take 1 tablet (0.5 mg total) by mouth at bedtime as needed (Nausea or vomiting). (Patient not taking: Reported on 07/14/2014) 30 tablet 1  . metFORMIN  (GLUCOPHAGE) 1000 MG tablet     . Multiple Vitamin (MULTIVITAMIN WITH MINERALS) TABS tablet Take 1 tablet by mouth daily.    Marland Kitchen oxyCODONE-acetaminophen (PERCOCET/ROXICET) 5-325 MG per tablet     . pantoprazole (PROTONIX) 40 MG tablet Take 1 tablet (40 mg total) by mouth daily. 30 tablet 5  . potassium chloride SA (K-DUR,KLOR-CON) 20 MEQ tablet Take 1 tablet (20 mEq total) by mouth daily. (Patient not taking: Reported on 04/15/2014) 30 tablet 6  . potassium chloride SA (K-DUR,KLOR-CON) 20 MEQ tablet     . pyridOXINE (VITAMIN B-6) 100 MG tablet Take 100 mg by mouth daily.    Marland Kitchen zolpidem (AMBIEN) 5 MG tablet Take 1 tablet (5 mg total) by mouth at bedtime as needed for sleep. 30 tablet 0   No current facility-administered medications for this visit.    OBJECTIVE: Middle-aged [place] American woman in no acute distress Filed Vitals:   07/16/14 1448  BP: 139/80  Pulse: 90  Temp: 97.4 F (36.3 C)  Resp: 18     Body mass index is 33.1 kg/(m^2).    ECOG FS:1 - Symptomatic but completely ambulatory  Sclerae unicteric, EOMs intact Oropharynx clear, no thrush or other lesions No cervical or supraclavicular adenopathy Lungs no rales or rhonchi Heart regular rate and rhythm Abd soft, nontender, positive bowel sounds MSK no focal spinal tenderness including over the area where she sometimes complains of back pain', minimal left upper extremity lymphedema Neuro: nonfocal, well oriented, positive affect Breasts: The right breast is unremarkable. The left breast is status post mastectomy and currently undergoing radiation. There is some hyperpigmentation but no desquamation. There is no evidence of chest wall recurrence. The left axilla is benign.LAB RESULTS:   Lab Results  Component Value Date   WBC 4.0 07/16/2014   NEUTROABS 2.7 07/16/2014   HGB 11.4* 07/16/2014   HCT 34.2* 07/16/2014   MCV 91.0 07/16/2014   PLT 214 07/16/2014      Chemistry      Component Value Date/Time   NA 142  07/16/2014 1414   NA 140 04/25/2014 0509   K 3.9 07/16/2014 1414   K 4.7 04/25/2014 0509   CL 101 04/25/2014 0509   CO2 25 07/16/2014 1414   CO2 25 04/25/2014 0509   BUN 7.4 07/16/2014 1414   BUN 10 04/25/2014 0509   CREATININE 0.8 07/16/2014 1414   CREATININE 1.10 04/25/2014 0509      Component Value  Date/Time   CALCIUM 9.6 07/16/2014 1414   CALCIUM 9.1 04/25/2014 0509   ALKPHOS 77 07/16/2014 1414   ALKPHOS 81 11/27/2012 0900   AST 29 07/16/2014 1414   AST 22 11/27/2012 0900   ALT 30 07/16/2014 1414   ALT 17 11/27/2012 0900   BILITOT 0.25 07/16/2014 1414   BILITOT 0.1* 11/27/2012 0900      STUDIES: No results found.   ASSESSMENT: 44 y.o. BRCA negative Shelly Hall woman with a history of sarcoidosis and breast cancer as follows:  (1)  status post left breast and left axillary lymph node biopsy 10/03/2013, both positive for a clinical T2 N2, stage IIIA invasive ductal carcinoma, grade 2, estrogen and progesterone receptor positive, HER-2 negative, with an MIB-1 of 20%.  (2) Treated with neoadjuvant chemotherapy consisting of doxorubicin and cyclophosphamide in dose dense fashion x4, completed 12/11/2013, followed by paclitaxel weekly x12 (dose reduced by 20% at cycle 5 because of neuropathy concerns) completed 03/19/2014.  (3) status post left mastectomy and left axillary lymph node dissection 04/23/2014 for a pT1c pN2, stage IIIA invasive ductal carcinoma, grade 3, estrogen receptor 99% positive, progesterone receptor 4% positive, with no HER-2 amplification  (3) adjuvant radiation to be completed 08/10/2014  (4) tamoxifen to be started at the end of radiation  (5) continuing tobacco abuse: The patient has been strongly advised to discontinue smoking  (6) genetic testing sent 10/16/2013 did not reveal any deleterious mutation in any of these genes. The genes tested were ATM, BARD1, BRCA1, BRCA2, BRIP1, CDH1, CHEK2, MRE11A, MUTYH, NBN, NF1, PALB2, PTEN, RAD50, RAD51C,  RAD51D, and TP53.  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 We are going to see her early April and if she is feeling up to it at that point we will likely start tamoxifen then.  She had been scheduled for hysterectomy for bleeding secondary to fibroids before the breast cancer problem arose. Since that time she has lost her insurance and so lost her gynecologist. She does have Medicaid. I am referring her to our GYN on group here to see if they could consider hysterectomy for her. Mean while she continues on iron supplementation.  She would like to have reconstruction and Medicaid will pay for that but it will not pay for contralateral breast reduction. Accordingly she is going to wait until she has insurance to consider reconstruction.  She has a good understanding of the overall plan. She agrees with it. She knows the goal of treatment in her case is cure. She will call with any problems that may develop before her next visit here. Chauncey Cruel, MD 07/16/2014 3:08 PM

## 2014-07-16 NOTE — Telephone Encounter (Signed)
, °

## 2014-07-17 ENCOUNTER — Ambulatory Visit: Payer: Medicaid Other

## 2014-07-17 ENCOUNTER — Ambulatory Visit
Admission: RE | Admit: 2014-07-17 | Discharge: 2014-07-17 | Disposition: A | Discharge status: Home / Self Care | Payer: Medicaid Other | Source: Ambulatory Visit | Attending: Radiation Oncology | Admitting: Radiation Oncology

## 2014-07-17 DIAGNOSIS — Z510 Encounter for antineoplastic radiation therapy: Secondary | ICD-10-CM | POA: Diagnosis not present

## 2014-07-20 ENCOUNTER — Ambulatory Visit: Payer: Medicaid Other

## 2014-07-20 ENCOUNTER — Other Ambulatory Visit: Payer: Self-pay | Admitting: Emergency Medicine

## 2014-07-20 DIAGNOSIS — C50412 Malignant neoplasm of upper-outer quadrant of left female breast: Secondary | ICD-10-CM

## 2014-07-20 MED ORDER — GABAPENTIN 300 MG PO CAPS: 600.0000 mg | ORAL_CAPSULE | Freq: Every day | ORAL | Status: DC

## 2014-07-21 ENCOUNTER — Ambulatory Visit
Admission: RE | Admit: 2014-07-21 | Discharge: 2014-07-21 | Disposition: A | Discharge status: Home / Self Care | Payer: Medicaid Other | Source: Ambulatory Visit | Attending: Radiation Oncology | Admitting: Radiation Oncology

## 2014-07-21 ENCOUNTER — Ambulatory Visit: Payer: Medicaid Other | Admitting: Radiation Oncology

## 2014-07-21 ENCOUNTER — Ambulatory Visit: Payer: Medicaid Other

## 2014-07-21 DIAGNOSIS — R5383 Other fatigue: Secondary | ICD-10-CM | POA: Insufficient documentation

## 2014-07-21 DIAGNOSIS — C50412 Malignant neoplasm of upper-outer quadrant of left female breast: Secondary | ICD-10-CM | POA: Diagnosis present

## 2014-07-21 DIAGNOSIS — Z510 Encounter for antineoplastic radiation therapy: Secondary | ICD-10-CM | POA: Diagnosis not present

## 2014-07-21 MED ORDER — RADIAPLEXRX EX GEL
Freq: Once | CUTANEOUS | Status: AC
  Administered 2014-07-21: 15:00:00 via TOPICAL

## 2014-07-21 NOTE — Progress Notes (Signed)
Weekly Management Note Current Dose: 34.2 Gy  Projected Dose: 60.4 Gy   Narrative:  The patient presents for routine under treatment assessment.  CBCT/MVCT images/Port film x-rays were reviewed.  The chart was checked. Doing well. Fatigue continues.   Physical Findings: Weight: 187 lb 3.2 oz (84.913 kg). Unchanged. Slightly dark chest wall.   Impression:  The patient is tolerating radiation.  Plan:  Continue treatment as planned. Continue radiaplex.

## 2014-07-21 NOTE — Progress Notes (Signed)
Nurse eval not completed; pt seen by Dr Pablo Ledger. Gave pt Radiaplex lotion.

## 2014-07-22 ENCOUNTER — Ambulatory Visit
Admission: RE | Admit: 2014-07-22 | Discharge: 2014-07-22 | Disposition: A | Discharge status: Home / Self Care | Payer: Medicaid Other | Source: Ambulatory Visit | Attending: Radiation Oncology | Admitting: Radiation Oncology

## 2014-07-22 ENCOUNTER — Telehealth: Payer: Self-pay | Admitting: *Deleted

## 2014-07-22 ENCOUNTER — Ambulatory Visit: Payer: Medicaid Other

## 2014-07-22 DIAGNOSIS — Z510 Encounter for antineoplastic radiation therapy: Secondary | ICD-10-CM | POA: Diagnosis not present

## 2014-07-22 NOTE — Telephone Encounter (Signed)
Called asking if gabapentin was refilled yet and if dose was changed.  Informed her order was sent on [id number] and dose was increased to 600 mg.

## 2014-07-23 ENCOUNTER — Ambulatory Visit
Admission: RE | Admit: 2014-07-23 | Discharge: 2014-07-23 | Disposition: A | Discharge status: Home / Self Care | Payer: Medicaid Other | Source: Ambulatory Visit | Attending: Radiation Oncology | Admitting: Radiation Oncology

## 2014-07-23 ENCOUNTER — Ambulatory Visit: Payer: Medicaid Other

## 2014-07-23 DIAGNOSIS — Z510 Encounter for antineoplastic radiation therapy: Secondary | ICD-10-CM | POA: Diagnosis not present

## 2014-07-24 ENCOUNTER — Ambulatory Visit: Payer: Medicaid Other

## 2014-07-24 ENCOUNTER — Ambulatory Visit
Admission: RE | Admit: 2014-07-24 | Discharge: 2014-07-24 | Disposition: A | Discharge status: Home / Self Care | Payer: Medicaid Other | Source: Ambulatory Visit | Attending: Radiation Oncology | Admitting: Radiation Oncology

## 2014-07-24 DIAGNOSIS — Z510 Encounter for antineoplastic radiation therapy: Secondary | ICD-10-CM | POA: Diagnosis not present

## 2014-07-27 ENCOUNTER — Ambulatory Visit
Admission: RE | Admit: 2014-07-27 | Discharge: 2014-07-27 | Disposition: A | Discharge status: Home / Self Care | Payer: Medicaid Other | Source: Ambulatory Visit | Attending: Radiation Oncology | Admitting: Radiation Oncology

## 2014-07-27 ENCOUNTER — Ambulatory Visit: Payer: Medicaid Other

## 2014-07-27 DIAGNOSIS — Z510 Encounter for antineoplastic radiation therapy: Secondary | ICD-10-CM | POA: Diagnosis not present

## 2014-07-28 ENCOUNTER — Ambulatory Visit
Admission: RE | Admit: 2014-07-28 | Discharge: 2014-07-28 | Disposition: A | Discharge status: Home / Self Care | Payer: Medicaid Other | Source: Ambulatory Visit | Attending: Radiation Oncology | Admitting: Radiation Oncology

## 2014-07-28 DIAGNOSIS — Z510 Encounter for antineoplastic radiation therapy: Secondary | ICD-10-CM | POA: Insufficient documentation

## 2014-07-28 DIAGNOSIS — C50412 Malignant neoplasm of upper-outer quadrant of left female breast: Secondary | ICD-10-CM | POA: Insufficient documentation

## 2014-07-28 MED ORDER — BIAFINE EX EMUL
CUTANEOUS | Status: DC | PRN
  Administered 2014-07-28: 15:00:00 via TOPICAL

## 2014-07-28 NOTE — Progress Notes (Signed)
Weekly Management Note Current Dose: 43.2  Gy  Projected Dose: 60.4 Gy   Narrative:  The patient presents for routine under treatment assessment.  CBCT/MVCT images/Port film x-rays were reviewed.  The chart was checked. Seen on treatment machine for electron markout. Skin itching in inframammary fold area.   Physical Findings: Weight:  . Unchanged. Dark skin over chest wall. No moist desquamation.   Impression:  The patient is tolerating radiation.  Plan:  Continue treatment as planned. Switch to biafene. OK to proceed with electrons.

## 2014-07-29 ENCOUNTER — Ambulatory Visit
Admission: RE | Admit: 2014-07-29 | Discharge: 2014-07-29 | Disposition: A | Discharge status: Home / Self Care | Payer: Medicaid Other | Source: Ambulatory Visit | Attending: Radiation Oncology | Admitting: Radiation Oncology

## 2014-07-29 DIAGNOSIS — Z510 Encounter for antineoplastic radiation therapy: Secondary | ICD-10-CM | POA: Diagnosis not present

## 2014-07-30 ENCOUNTER — Ambulatory Visit
Admission: RE | Admit: 2014-07-30 | Discharge: 2014-07-30 | Disposition: A | Discharge status: Home / Self Care | Payer: Medicaid Other | Source: Ambulatory Visit | Attending: Radiation Oncology | Admitting: Radiation Oncology

## 2014-07-30 ENCOUNTER — Ambulatory Visit: Payer: Medicaid Other

## 2014-07-30 DIAGNOSIS — Z510 Encounter for antineoplastic radiation therapy: Secondary | ICD-10-CM | POA: Diagnosis not present

## 2014-07-31 ENCOUNTER — Ambulatory Visit: Payer: Medicaid Other

## 2014-07-31 ENCOUNTER — Ambulatory Visit
Admission: RE | Admit: 2014-07-31 | Discharge: 2014-07-31 | Disposition: A | Discharge status: Home / Self Care | Payer: Medicaid Other | Source: Ambulatory Visit | Attending: Radiation Oncology | Admitting: Radiation Oncology

## 2014-07-31 DIAGNOSIS — Z510 Encounter for antineoplastic radiation therapy: Secondary | ICD-10-CM | POA: Diagnosis not present

## 2014-08-03 ENCOUNTER — Ambulatory Visit
Admission: RE | Admit: 2014-08-03 | Discharge: 2014-08-03 | Disposition: A | Discharge status: Home / Self Care | Payer: Medicaid Other | Source: Ambulatory Visit | Attending: Radiation Oncology | Admitting: Radiation Oncology

## 2014-08-03 ENCOUNTER — Ambulatory Visit: Payer: Medicaid Other

## 2014-08-03 DIAGNOSIS — Z510 Encounter for antineoplastic radiation therapy: Secondary | ICD-10-CM | POA: Diagnosis not present

## 2014-08-03 NOTE — Progress Notes (Signed)
Patient to nursing for skin assessment and pain.left lateral chest wall with increased hyperpigmentation but not peeling.given hydrogel pads and shown how to apply and secure with mesh briefs.patient to see Dr.Wentowrth on tomorrow for further direction.Currently taking tylenol but will try nsaid for anti-inflammatory effects.

## 2014-08-04 ENCOUNTER — Ambulatory Visit
Admission: RE | Admit: 2014-08-04 | Discharge: 2014-08-04 | Disposition: A | Discharge status: Home / Self Care | Payer: Medicaid Other | Source: Ambulatory Visit | Attending: Radiation Oncology | Admitting: Radiation Oncology

## 2014-08-04 ENCOUNTER — Ambulatory Visit: Payer: Medicaid Other

## 2014-08-04 VITALS — BP 143/105 | HR 92 | Temp 98.0°F | Wt 190.2 lb

## 2014-08-04 DIAGNOSIS — C50412 Malignant neoplasm of upper-outer quadrant of left female breast: Secondary | ICD-10-CM | POA: Insufficient documentation

## 2014-08-04 DIAGNOSIS — Z510 Encounter for antineoplastic radiation therapy: Secondary | ICD-10-CM | POA: Insufficient documentation

## 2014-08-04 DIAGNOSIS — L599 Disorder of the skin and subcutaneous tissue related to radiation, unspecified: Secondary | ICD-10-CM | POA: Insufficient documentation

## 2014-08-04 MED ORDER — OXYCODONE-ACETAMINOPHEN 5-325 MG PO TABS: 1.0000 | ORAL_TABLET | Freq: Four times a day (QID) | ORAL | Status: DC | PRN

## 2014-08-04 NOTE — Progress Notes (Signed)
Weekly Management Note Current Dose: 52.4  Gy  Projected Dose:60.4  Gy   Narrative:  The patient presents for routine under treatment assessment.  CBCT/MVCT images/Port film x-rays were reviewed.  The chart was checked. She is doing well. I saw her on the treatment machine to verify electron set up. She has soreness and pain in the inframammary fold area of the left chest wall which is stable to worse.  Physical Findings: Weight: 190 lb 3.2 oz (86.274 kg). Unchanged. Skin is hyperpigmented. Dry desquamation in the inframammary fold.   Impression:  The patient is tolerating radiation.  Plan:  Continue treatment as planned. Continue biafene. Follow up in 1 month.

## 2014-08-04 NOTE — Addendum Note (Signed)
Encounter addended by: Thea Silversmith, MD on: 08/04/2014  3:35 PM<BR>     Documentation filed: Orders

## 2014-08-05 ENCOUNTER — Ambulatory Visit: Payer: Medicaid Other

## 2014-08-05 ENCOUNTER — Ambulatory Visit
Admission: RE | Admit: 2014-08-05 | Discharge: 2014-08-05 | Disposition: A | Discharge status: Home / Self Care | Payer: Medicaid Other | Source: Ambulatory Visit | Attending: Radiation Oncology | Admitting: Radiation Oncology

## 2014-08-05 DIAGNOSIS — Z510 Encounter for antineoplastic radiation therapy: Secondary | ICD-10-CM | POA: Diagnosis not present

## 2014-08-06 ENCOUNTER — Ambulatory Visit
Admission: RE | Admit: 2014-08-06 | Discharge: 2014-08-06 | Disposition: A | Discharge status: Home / Self Care | Payer: Medicaid Other | Source: Ambulatory Visit | Attending: Radiation Oncology | Admitting: Radiation Oncology

## 2014-08-06 ENCOUNTER — Ambulatory Visit: Payer: Medicaid Other

## 2014-08-06 DIAGNOSIS — Z510 Encounter for antineoplastic radiation therapy: Secondary | ICD-10-CM | POA: Diagnosis not present

## 2014-08-07 ENCOUNTER — Ambulatory Visit: Payer: Medicaid Other

## 2014-08-07 ENCOUNTER — Ambulatory Visit
Admission: RE | Admit: 2014-08-07 | Discharge: 2014-08-07 | Disposition: A | Discharge status: Home / Self Care | Payer: Medicaid Other | Source: Ambulatory Visit | Attending: Radiation Oncology | Admitting: Radiation Oncology

## 2014-08-07 DIAGNOSIS — Z510 Encounter for antineoplastic radiation therapy: Secondary | ICD-10-CM | POA: Diagnosis not present

## 2014-08-10 ENCOUNTER — Ambulatory Visit: Payer: Medicaid Other

## 2014-08-10 ENCOUNTER — Ambulatory Visit
Admission: RE | Admit: 2014-08-10 | Discharge: 2014-08-10 | Disposition: A | Discharge status: Home / Self Care | Payer: Medicaid Other | Source: Ambulatory Visit | Attending: Radiation Oncology | Admitting: Radiation Oncology

## 2014-08-11 ENCOUNTER — Encounter: Payer: Self-pay | Admitting: Radiation Oncology

## 2014-08-11 ENCOUNTER — Ambulatory Visit: Payer: Medicaid Other

## 2014-08-11 ENCOUNTER — Ambulatory Visit
Admission: RE | Admit: 2014-08-11 | Discharge: 2014-08-11 | Disposition: A | Discharge status: Home / Self Care | Payer: Medicaid Other | Source: Ambulatory Visit | Attending: Radiation Oncology | Admitting: Radiation Oncology

## 2014-08-11 DIAGNOSIS — Z510 Encounter for antineoplastic radiation therapy: Secondary | ICD-10-CM | POA: Diagnosis not present

## 2014-08-11 DIAGNOSIS — C50412 Malignant neoplasm of upper-outer quadrant of left female breast: Secondary | ICD-10-CM

## 2014-08-11 MED ORDER — BIAFINE EX EMUL
Freq: Two times a day (BID) | CUTANEOUS | Status: DC
  Administered 2014-08-11: 14:00:00 via TOPICAL

## 2014-08-11 NOTE — Progress Notes (Signed)
  Radiation Oncology         [phone] ________________________________  Name: EMSLEY CUSTER MRN: [id number]  Date: 08/11/2014  DOB: January 19, 1971  End of Treatment Note  Diagnosis:   T3N1 Invasive ductal carcinoma of the left breast  Indication for treatment:  Curative    Radiation treatment dates:   06/23/2014-08/11/2014  Site/dose:   Left chest wall / 50.4 Gray @ 1.8 Pearline Cables per fraction x 28 fractions Left Supraclavicular fossa / 45 Gray @1 .8 Gray per fraction x 25 fractions Left PAB / 45 Gy at 1.8 Gray per fraction x 25 fractions Left scar / 10 Gray at [employer] per fraction x 5 fractions  Beams/Energies: Opposed tangents with reduced fields/ 6 MV photons RAO / 6 MV photons PA / 6 MV photons En face with 6 MeV electrons  Narrative: The patient tolerated radiation treatment relatively well.   She was treated with breath hold technique for cardiac sparing.  She developed the expected hyperpigmentation with some moist desquamation in her axilla  Plan: The patient has completed radiation treatment. The patient will return to radiation oncology clinic for routine followup in one month. I advised them to call or return sooner if they have any questions or concerns related to their recovery or treatment.  ------------------------------------------------  Thea Silversmith, MD

## 2014-08-11 NOTE — Progress Notes (Signed)
Patient has completed radiation treatment to left chest wall.Appling neosporin to left axilla which has healed significantly over the last 5 days.continue application  Of neosporin twice daily over next 5 to 10 days and biafine over the remaining treatment field.Given another tube of biafine and hydrogels.Follow up in one month but knows to call me sooner if needed.

## 2014-08-11 NOTE — Progress Notes (Signed)
  Radiation Oncology         [phone] ________________________________  Name: Shelly Hall MRN: [id number]  Date: 08/11/2014  DOB: [birth year]  Weekly Radiation Therapy Management    Current Dose: 60.4 Gy     Planned Dose:  60.4 Gy  Narrative . . . . . . . . The patient presents for routine under treatment assessment.                                   The patient is without complaint. She is happy to complete her radiation therapy today. Patient was given another tube of Biafine and hydrogel dressings. She has some discomfort along the left chest region                                 Set-up films were reviewed.                                 The chart was checked. Physical Findings. . .  temperature is 98.1 F (36.7 C). Her blood pressure is 178/100 and her pulse is 82. . Weight essentially stable.  Hyperpigmentation changes and healing moist desquamation no signs of infection along the chest region/ axillary region. Impression . . . . . . . The patient is tolerating radiation. Plan . . . . . . . . . . . . Routine follow-up with Dr. Pablo Ledger in one month or sooner if more significant skin reaction occurs.  ________________________________   Blair Promise, PhD, MD

## 2014-08-19 ENCOUNTER — Telehealth: Payer: Self-pay | Admitting: Nurse Practitioner

## 2014-08-19 NOTE — Telephone Encounter (Signed)
pt cld left voicemail to r/s lab appt on 3/24 to 3/22 per pt req-r/s and gave pt updated time & date-pt understood

## 2014-08-22 NOTE — Progress Notes (Signed)
Name: Shelly Hall   MRN: [id number]  Date:  2.25.16   DOB: Aug 10, 1970  Status:outpatient    DIAGNOSIS: Left breast cancer  CONSENT VERIFIED: yes   SET UP: Patient is setup supine   IMMOBILIZATION:  The following immobilization was used:Custom Moldable Pillow, breast board.   NARRATIVE: Shelly Hall underwent complex simulation and treatment planning for her boost treatment today. Her scar was outlined on the CT scan and electron were felt to be appropriate     6 MeV electrons will be prescribed.   I personally oversaw and approved the construction of a unique block which will be used for beam modification purposes.  A special port plan is requested.

## 2014-08-25 ENCOUNTER — Other Ambulatory Visit (HOSPITAL_BASED_OUTPATIENT_CLINIC_OR_DEPARTMENT_OTHER): Payer: Medicaid Other

## 2014-08-25 DIAGNOSIS — F17200 Nicotine dependence, unspecified, uncomplicated: Secondary | ICD-10-CM

## 2014-08-25 DIAGNOSIS — D869 Sarcoidosis, unspecified: Secondary | ICD-10-CM

## 2014-08-25 DIAGNOSIS — C50412 Malignant neoplasm of upper-outer quadrant of left female breast: Secondary | ICD-10-CM

## 2014-08-25 LAB — CBC WITH DIFFERENTIAL/PLATELET
BASO%: 0.3 % (ref 0.0–2.0)
Basophils Absolute: 0 10*3/uL (ref 0.0–0.1)
EOS ABS: 0.2 10*3/uL (ref 0.0–0.5)
EOS%: 5.3 % (ref 0.0–7.0)
HCT: 38.6 % (ref 34.8–46.6)
HGB: 12.8 g/dL (ref 11.6–15.9)
LYMPH%: 19.5 % (ref 14.0–49.7)
MCH: 30.5 pg (ref 25.1–34.0)
MCHC: 33.2 g/dL (ref 31.5–36.0)
MCV: 91.9 fL (ref 79.5–101.0)
MONO#: 0.4 10*3/uL (ref 0.1–0.9)
MONO%: 11.1 % (ref 0.0–14.0)
NEUT#: 2.1 10*3/uL (ref 1.5–6.5)
NEUT%: 63.8 % (ref 38.4–76.8)
Platelets: 218 10*3/uL (ref 145–400)
RBC: 4.2 10*6/uL (ref 3.70–5.45)
RDW: 13.5 % (ref 11.2–14.5)
WBC: 3.2 10*3/uL — ABNORMAL LOW (ref 3.9–10.3)
lymph#: 0.6 10*3/uL — ABNORMAL LOW (ref 0.9–3.3)

## 2014-08-25 LAB — COMPREHENSIVE METABOLIC PANEL (CC13)
ALT: 29 U/L (ref 0–55)
AST: 23 U/L (ref 5–34)
Albumin: 3.9 g/dL (ref 3.5–5.0)
Alkaline Phosphatase: 85 U/L (ref 40–150)
Anion Gap: 13 mEq/L — ABNORMAL HIGH (ref 3–11)
BUN: 12.1 mg/dL (ref 7.0–26.0)
CALCIUM: 9.4 mg/dL (ref 8.4–10.4)
CHLORIDE: 106 meq/L (ref 98–109)
CO2: 22 meq/L (ref 22–29)
CREATININE: 0.8 mg/dL (ref 0.6–1.1)
EGFR: >90 mL/min/{1.73_m2} (ref >90)
GLUCOSE: 113 mg/dL (ref 70–140)
Potassium: 3.7 mEq/L (ref 3.5–5.1)
Sodium: 141 mEq/L (ref 136–145)
Total Bilirubin: 0.32 mg/dL (ref 0.20–1.20)
Total Protein: 7.5 g/dL (ref 6.4–8.3)

## 2014-08-25 LAB — FOLLICLE STIMULATING HORMONE: FSH: 105.9 m[IU]/mL

## 2014-08-26 ENCOUNTER — Other Ambulatory Visit: Payer: Self-pay | Admitting: Nurse Practitioner

## 2014-08-27 ENCOUNTER — Other Ambulatory Visit: Payer: Medicaid Other

## 2014-08-29 LAB — ESTRADIOL, ULTRA SENS: Estradiol, Ultra Sensitive: 14 pg/mL

## 2014-09-10 ENCOUNTER — Ambulatory Visit (HOSPITAL_BASED_OUTPATIENT_CLINIC_OR_DEPARTMENT_OTHER): Payer: Medicaid Other | Admitting: Nurse Practitioner

## 2014-09-10 ENCOUNTER — Ambulatory Visit (HOSPITAL_BASED_OUTPATIENT_CLINIC_OR_DEPARTMENT_OTHER): Payer: Medicaid Other

## 2014-09-10 ENCOUNTER — Telehealth: Payer: Self-pay | Admitting: Nurse Practitioner

## 2014-09-10 ENCOUNTER — Encounter: Payer: Self-pay | Admitting: Nurse Practitioner

## 2014-09-10 VITALS — BP 164/110 | HR 92 | Temp 97.6°F | Resp 18 | Ht 63.0 in | Wt 183.5 lb

## 2014-09-10 DIAGNOSIS — Z170 Estrogen receptor positive status [ER+]: Secondary | ICD-10-CM

## 2014-09-10 DIAGNOSIS — C50412 Malignant neoplasm of upper-outer quadrant of left female breast: Secondary | ICD-10-CM

## 2014-09-10 DIAGNOSIS — Z95828 Presence of other vascular implants and grafts: Secondary | ICD-10-CM

## 2014-09-10 DIAGNOSIS — C773 Secondary and unspecified malignant neoplasm of axilla and upper limb lymph nodes: Secondary | ICD-10-CM

## 2014-09-10 DIAGNOSIS — Z1329 Encounter for screening for other suspected endocrine disorder: Secondary | ICD-10-CM

## 2014-09-10 DIAGNOSIS — Z452 Encounter for adjustment and management of vascular access device: Secondary | ICD-10-CM

## 2014-09-10 MED ORDER — HEPARIN SOD (PORK) LOCK FLUSH 100 UNIT/ML IV SOLN
500.0000 [IU] | Freq: Once | INTRAVENOUS | Status: AC
  Administered 2014-09-10: 500 [IU] via INTRAVENOUS
  Filled 2014-09-10: qty 5

## 2014-09-10 MED ORDER — TAMOXIFEN CITRATE 20 MG PO TABS: 20.0000 mg | ORAL_TABLET | Freq: Every day | ORAL | Status: DC

## 2014-09-10 MED ORDER — SODIUM CHLORIDE 0.9 % IJ SOLN
10.0000 mL | INTRAMUSCULAR | Status: DC | PRN
  Administered 2014-09-10: 10 mL via INTRAVENOUS
  Filled 2014-09-10: qty 10

## 2014-09-10 NOTE — Telephone Encounter (Signed)
per pof ot sch pt appt-gave pt copy of sch °

## 2014-09-10 NOTE — Patient Instructions (Signed)

## 2014-09-10 NOTE — Progress Notes (Signed)
[place]  Telephone:([phone] [phone]    ID: Shelly Hall OB: [birth year]  MR#: [id number]  FOY#:[id number]  PCP: Kerin Perna, NP GYN:  [name] SU: [name] OTHER MD: Thea Silversmith  CHIEF COMPLAINT:  Left Breast Cancer, estrogen receptor positive CURRENT TREATMENT:  To begin antiestrogen therapy  BREAST CANCER HISTORY: From the original intake note:  Shelly Hall palpated a mass in her left breast mid-April 2015 and immediately brought it to Dr. Benjie Karvonen' attention. She was set up for bilateral screening mammography with tomography at [facility] hospital 09/19/2013. The possible distortion in the left breast and a nearby group of calcifications was felt to warrant further evaluation, and on 09/30/2013 she underwent unilateral left diagnostic mammography and ultrasonography. There was an irregular mass in the outer left breast associated with microcalcifications. 4 cm anterior to this there was another cluster of pleomorphic calcifications spanning 7 mm. He had more anteriorly there was an irregular mass associated with other calcifications. In total the abnormalities in the left breast spent approximately 10 cm, and is segmental distribution. On exam there was a palpable firm mass at the 2:30 o'clock position of the left breast 10 cm from the nipple. There was palpable adenopathy in the inferior left axilla. Ultrasound confirmed an irregular hypoechoic mass in the left breast measuring 4.6 cm. In addition, a 1.4 cm oval slightly irregular mass was noted at the 3:00 position and yet another mass measured 9 mm in the same area. Ultrasound of the left axilla showed a dominant 3.6 cm lymph node.  Biopsy of 2 of the left breast masses and the suspicious left breast lymph node on 10/03/2013 showed ([id number]) an invasive ductal carcinoma, grade 2, estrogen receptor 90% positive with strong staining intensity, progesterone receptor 40% positive but moderate  staining intensity, with an MIB-1 of 20% and no HER-2 amplification. (Because all 3 biopsies were morphologically identical, only one prognostic panel was sent).  On 10/10/2013 the patient underwent bilateral breast MRI. This showed numerous enhancing masses in the left breast, the largest measuring 3.5 cm, and the aggregate measuring 12.7 cm. There were numerous enlarged left axillary lymph nodes, at least 5 of which were enlarged, both at levels 1 and 2. The largest measured 2.3 cm. The right breast was negative.  The patient's subsequent history is as detailed below.  INTERVAL HISTORY: Shelly Hall returns today for follow up of her breast cancer. She completed her radiation treatments on 08/11/14 and is here to discuss beginning antiestrogen therapy. She continues to spot regularly. She has a history of fibroids and plans to have a hysterectomy this Spring.   REVIEW OF SYSTEMS: Shelly Hall denies fevers, chills, nausea, vomiting, or changes in bowel or bladder habits. She is eating and drinking well. She denies shortness of breath, chest pain, cough, or palpitations. She has chronic joint pain but manages this with OTC analgesics. Her severe insomnia continues. She has tried [place], benadryl, and melatonin with no effect. The gabapentin she takes at night at least helps with her hot flashes to some degree. A detailed review of systems is otherwise stable.  PAST MEDICAL HISTORY: Past Medical History  Diagnosis Date  . Hypertension   . Sarcoidosis   . Anxiety   . Depression   . Anemia   . Complication of anesthesia     makes pt. restless and unable to be still  . High cholesterol   . Heart murmur   . Thyroid cancer 2015  . Type II diabetes  mellitus   . Migraine     "in the past; none in years" (04/23/2014)  . Breast cancer 2015    left upper outer;  had chemo    PAST SURGICAL HISTORY: Past Surgical History  Procedure Laterality Date  . Tubal ligation  2001  . Endobronchial ultrasound  Bilateral 12/02/2012    Procedure: ENDOBRONCHIAL ULTRASOUND;  Surgeon: Collene Gobble, MD;  Location: [facility];  Service: Cardiopulmonary;  Laterality: Bilateral;  . Portacath placement N/A 10/22/2013    Procedure: INSERTION PORT-A-CATH;  Surgeon: [name], MD;  Location: [place];  Service: General;  Laterality: N/A;  . Lung biopsy    . Mastectomy w/ sentinel node biopsy Left 04/23/2014    & axillary LND  . Breast biopsy Left 2015 X 3  . Mastectomy w/ sentinel node biopsy Left 04/23/2014    Procedure: LEFT BREAST MASTECTOMY WITH SENTINEL LYMPH NODE BIOPSY AND AXILLARY LYMPH NODE DISECTION;  Surgeon: [name], MD;  Location: [place];  Service: General;  Laterality: Left;  . Thyroid lobectomy Left 04/23/2014    Procedure: LEFT THYROID LOBECTOMY;  Surgeon: [name], MD;  Location: [facility];  Service: General;  Laterality: Left;    FAMILY HISTORY Family History  Problem Relation Age of Onset  . Cancer Paternal Aunt     "bone cancer"; deceased 57s  . Cancer Paternal Uncle     stomach cancer; deceased 81s  . Cancer Paternal Aunt     unk. primary; currently 33s  . Prostate cancer Maternal Grandfather 16  . Asthma Mother   The patient's parents are living, in fair mid to late 108s. The patient had one brother, no sisters. There is no history of breast or ovarian cancer in the family to her knowledge.   GYNECOLOGIC HISTORY:   (Reviewed 11/27/2013)  menarche age 34, first live birth at 40. She is GX P2. She was still having regular periods at the time of the start of her chemotherapy in May 2015.  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 She is single and lives alone, although her mother is currently staying with her. Her son Shelly Hall lives in [place] and works at [employer]. Son Shelly Hall lives in [place] where he works at a [employer]. The patient has 1 grandchild born in Sept 2015, by her son, Shelly Hall. She is a  Psychologist, forensic.    ADVANCED DIRECTIVES: not in place   HEALTH MAINTENANCE: (Updated 11/27/2013) History  Substance Use Topics  . Smoking status: Current Some Day Smoker -- 0.25 packs/day for 24 years    Types: Cigarettes  . Smokeless tobacco: Never Used     Comment: 04/23/2014 "using vapor; down to 3-4 cigarettes/day now"  . Alcohol Use: 6.0 oz/week    4 Cans of beer, 4 Shots of liquor, 2 Standard drinks or equivalent per week     Colonoscopy: Never  PAP: Not on file  Bone density: Not on file  Lipid panel:  Not on file  Allergies  Allergen Reactions  . Nyquil Multi-Symptom [Pseudoeph-Doxylamine-Dm-Apap] Itching and Other (See Comments)    Skin peels on hands, and feet    Current Outpatient Prescriptions  Medication Sig Dispense Refill  . atenolol-chlorthalidone (TENORETIC) 50-25 MG per tablet Take 1 tablet by mouth daily. 30 tablet 1  . emollient (BIAFINE) cream Apply 1 application topically 2 (two) times daily.    . ferrous sulfate 325 (65 FE) MG tablet Take 325 mg by mouth 2 (two) times  daily with a meal.    . gabapentin (NEURONTIN) 300 MG capsule Take 2 capsules (600 mg total) by mouth at bedtime. 60 capsule 5  . gemfibrozil (LOPID) 600 MG tablet Take 600 mg by mouth 2 (two) times daily before a meal.    . lisinopril (PRINIVIL,ZESTRIL) 10 MG tablet Take 10 mg by mouth daily.    . metFORMIN (GLUCOPHAGE) 1000 MG tablet 1,000 mg 2 (two) times daily with a meal.     . Multiple Vitamin (MULTIVITAMIN WITH MINERALS) TABS tablet Take 1 tablet by mouth daily.    Marland Kitchen oxyCODONE-acetaminophen (PERCOCET/ROXICET) 5-325 MG per tablet Take 1 tablet by mouth every 6 (six) hours as needed for severe pain. 30 tablet 0  . pantoprazole (PROTONIX) 40 MG tablet Take 1 tablet (40 mg total) by mouth daily. 30 tablet 5  . potassium chloride SA (K-DUR,KLOR-CON) 20 MEQ tablet Take 20 mEq by mouth daily.    Marland Kitchen pyridOXINE (VITAMIN B-6) 100 MG tablet Take 100 mg by mouth daily.    Marland Kitchen albuterol (PROVENTIL  HFA;VENTOLIN HFA) 108 (90 BASE) MCG/ACT inhaler Inhale 2 puffs into the lungs every 6 (six) hours as needed for wheezing or shortness of breath.    . docusate sodium (COLACE) 100 MG capsule Take 300 mg by mouth daily.     . tamoxifen (NOLVADEX) 20 MG tablet Take 1 tablet (20 mg total) by mouth daily. 30 tablet 3   No current facility-administered medications for this visit.   Facility-Administered Medications Ordered in Other Visits  Medication Dose Route Frequency Provider Last Rate Last Dose  . sodium chloride 0.9 % injection 10 mL  10 mL Intravenous PRN Laurie Panda, NP   10 mL at 09/10/14 1229    OBJECTIVE: Middle-aged [place] American woman in no acute distress Filed Vitals:   09/10/14 1159  BP: 164/110  Pulse:   Temp:   Resp:      Body mass index is 32.51 kg/(m^2).    ECOG FS:1 - Symptomatic but completely ambulatory   Skin: warm, dry  HEENT: sclerae anicteric, conjunctivae pink, oropharynx clear. No thrush or mucositis.  Lymph Nodes: No cervical or supraclavicular lymphadenopathy  Lungs: clear to auscultation bilaterally, no rales, wheezes, or rhonci  Heart: regular rate and rhythm  Abdomen: round, soft, non tender, positive bowel sounds  Musculoskeletal: No focal spinal tenderness, no peripheral edema  Neuro: non focal, well oriented, positive affect  Breasts: left breast status post lumpectomy and radiation. Residual hyperpigmentation present. No evidence of recurrent disease. Left axilla benign.   LAB RESULTS:   Lab Results  Component Value Date   WBC 3.2* 08/25/2014   NEUTROABS 2.1 08/25/2014   HGB 12.8 08/25/2014   HCT 38.6 08/25/2014   MCV 91.9 08/25/2014   PLT 218 08/25/2014      Chemistry      Component Value Date/Time   NA 141 08/25/2014 1057   NA 140 04/25/2014 0509   K 3.7 08/25/2014 1057   K 4.7 04/25/2014 0509   CL 101 04/25/2014 0509   CO2 22 08/25/2014 1057   CO2 25 04/25/2014 0509   BUN 12.1 08/25/2014 1057   BUN 10 04/25/2014 0509    CREATININE 0.8 08/25/2014 1057   CREATININE 1.10 04/25/2014 0509      Component Value Date/Time   CALCIUM 9.4 08/25/2014 1057   CALCIUM 9.1 04/25/2014 0509   ALKPHOS 85 08/25/2014 1057   ALKPHOS 81 11/27/2012 0900   AST 23 08/25/2014 1057   AST 22 11/27/2012 0900  ALT 29 08/25/2014 1057   ALT 17 11/27/2012 0900   BILITOT 0.32 08/25/2014 1057   BILITOT 0.1* 11/27/2012 0900      STUDIES: No results found.   ASSESSMENT: 44 y.o. BRCA negative [place] woman with a history of sarcoidosis and breast cancer as follows:  (1)  status post left breast and left axillary lymph node biopsy 10/03/2013, both positive for a clinical T2 N2, stage IIIA invasive ductal carcinoma, grade 2, estrogen and progesterone receptor positive, HER-2 negative, with an MIB-1 of 20%.  (2) Treated with neoadjuvant chemotherapy consisting of doxorubicin and cyclophosphamide in dose dense fashion x4, completed 12/11/2013, followed by paclitaxel weekly x12 (dose reduced by 20% at cycle 5 because of neuropathy concerns) completed 03/19/2014.  (3) status post left mastectomy and left axillary lymph node dissection 04/23/2014 for a pT1c pN2, stage IIIA invasive ductal carcinoma, grade 3, estrogen receptor 99% positive, progesterone receptor 4% positive, with no HER-2 amplification  (3) adjuvant radiation completed 08/11/2014  (4) tamoxifen started early April 2016  (5) continuing tobacco abuse: The patient has been strongly advised to discontinue smoking  (6) genetic testing sent 10/16/2013 did not reveal any deleterious mutation in any of these genes. The genes tested were ATM, BARD1, BRCA1, BRCA2, BRIP1, CDH1, CHEK2, MRE11A, MUTYH, NBN, NF1, PALB2, PTEN, RAD50, RAD51C, RAD51D, and TP53.  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 The labs drawn 2 weeks ago were  reviewed in detail and were entirely stable. Her estradiol level is low, but not quite low enough to consider her postmenopausal. She meets with her gynecologist next month to discuss having a hysterectomy anyhow. We discussed antiestrogen therapy in detail, and she will go ahead and begin tamoxifen tomorrow after she picks it up from the pharmacy. She knows to be on the look out for hot flashes, and vaginal wetness. She is also aware that this increases her risk of DVTs and other blood clots to some degree.   Shelly Hall wants to have reconstructive surgery on her left chest, but knows that she will also need a right breast reduction, and medicaid will not pay for contralateral breast surgery. Thus she is keeping her port until these arrangements can be made. She knows the will need it flushed every 6 weeks, so I am sending her back to there flush room after our visit to have this completed.   Shelly Hall will return in 6 weeks for labs and a follow up visit. She understands and agrees with this plan. She know the goal of treatment in her case is cure. She has been encouraged to call with any issues that might arise before her next visit here.  Laurie Panda, NP 09/10/2014 1:03 PM

## 2014-09-17 ENCOUNTER — Ambulatory Visit
Admission: RE | Admit: 2014-09-17 | Discharge: 2014-09-17 | Disposition: A | Discharge status: Home / Self Care | Payer: Medicaid Other | Source: Ambulatory Visit | Attending: Radiation Oncology | Admitting: Radiation Oncology

## 2014-09-17 VITALS — BP 106/70 | HR 108 | Temp 98.1°F | Wt 184.4 lb

## 2014-09-17 DIAGNOSIS — C50412 Malignant neoplasm of upper-outer quadrant of left female breast: Secondary | ICD-10-CM

## 2014-09-17 NOTE — Progress Notes (Signed)
   Department of Radiation Oncology  Phone:  [phone] Fax:        [phone]   Name: Shelly Hall MRN: [id number]  DOB: Nov 22, 1970  Date: 09/17/2014  Follow Up Visit Note  Diagnosis: Breast cancer of upper-outer quadrant of left female breast   Staging form: Breast, AJCC 7th Edition     Clinical: Stage IIIA (T3, N1, cM0) - Unsigned       Staging comments: Staged at breast conference 10/15/13      Pathologic: No stage assigned - Unsigned  Summary and Interval since last radiation: 1 month from radiation to the left breast and axilla/SCLV  Interval History: Shelly Hall presents today for routine followup.  She is feeling well. Her skin is healing but still dark. She is using biafene. She is interested in learning more about reconstruction. She has some pulling type sensations when she reaches down or up along her left chest wall. She saw PT for her maximum number of medicaid allowed visits.  She has a follow up with medical oncology next month. She is also seeing gynecology to talk about a hysterectomy per medical oncology.   Physical Exam:  Filed Vitals:   09/17/14 1609  BP: 106/70  Pulse: 108  Temp: 98.1 F (36.7 C)  Weight: 184 lb 6.4 oz (83.643 kg)   Hyperpigmentation over the left chest wall, sclv foss and back. Skin is well healed.   IMPRESSION: Shelly Hall is a 44 y.o. female s/p radiation with resolving acute effects of treatment  PLAN:  She is doing well. We discussed the need for follow up every 4-6 months which she has scheduled.  We discussed the need for yearly mammogram on the right which she can schedule with her OBGYN or with medical oncology. We discussed the need for sun protection in the treated area.  She can always call me with questions.  I will follow up with her on an as needed basis.   She will use Vitamin E on her chest wall to help with the pigmentation. We talked about continuing her exercises to stretch out that area on the left. I will ask our breast  cancer navigators to investigate plastic surgery options at [facility] for medicaid patients.      Thea Silversmith, MD

## 2014-09-21 ENCOUNTER — Encounter: Payer: Self-pay | Admitting: *Deleted

## 2014-09-22 ENCOUNTER — Telehealth: Payer: Self-pay | Admitting: Oncology

## 2014-09-22 NOTE — Telephone Encounter (Signed)
Could not reach patient by phone,printed off information for dr Ashley Mariner for 09/30/14 3;00 and mailed it to the patient

## 2014-10-22 ENCOUNTER — Ambulatory Visit (HOSPITAL_BASED_OUTPATIENT_CLINIC_OR_DEPARTMENT_OTHER): Payer: Medicaid Other | Admitting: Nurse Practitioner

## 2014-10-22 ENCOUNTER — Other Ambulatory Visit (HOSPITAL_BASED_OUTPATIENT_CLINIC_OR_DEPARTMENT_OTHER): Payer: Medicaid Other

## 2014-10-22 ENCOUNTER — Ambulatory Visit (HOSPITAL_BASED_OUTPATIENT_CLINIC_OR_DEPARTMENT_OTHER): Payer: Medicaid Other

## 2014-10-22 ENCOUNTER — Telehealth: Payer: Self-pay | Admitting: Nurse Practitioner

## 2014-10-22 ENCOUNTER — Encounter: Payer: Self-pay | Admitting: Nurse Practitioner

## 2014-10-22 VITALS — BP 133/91 | HR 68 | Temp 97.6°F | Resp 18 | Ht 63.0 in | Wt 183.8 lb

## 2014-10-22 DIAGNOSIS — C50412 Malignant neoplasm of upper-outer quadrant of left female breast: Secondary | ICD-10-CM

## 2014-10-22 DIAGNOSIS — Z79810 Long term (current) use of selective estrogen receptor modulators (SERMs): Secondary | ICD-10-CM

## 2014-10-22 DIAGNOSIS — Z170 Estrogen receptor positive status [ER+]: Secondary | ICD-10-CM | POA: Diagnosis not present

## 2014-10-22 DIAGNOSIS — R232 Flushing: Secondary | ICD-10-CM

## 2014-10-22 DIAGNOSIS — Z452 Encounter for adjustment and management of vascular access device: Secondary | ICD-10-CM | POA: Diagnosis not present

## 2014-10-22 DIAGNOSIS — Z95828 Presence of other vascular implants and grafts: Secondary | ICD-10-CM

## 2014-10-22 DIAGNOSIS — C773 Secondary and unspecified malignant neoplasm of axilla and upper limb lymph nodes: Secondary | ICD-10-CM | POA: Diagnosis not present

## 2014-10-22 LAB — COMPREHENSIVE METABOLIC PANEL (CC13)
ALK PHOS: 90 U/L (ref 40–150)
ALT: 16 U/L (ref 0–55)
AST: 16 U/L (ref 5–34)
Albumin: 3.6 g/dL (ref 3.5–5.0)
Anion Gap: 12 mEq/L — ABNORMAL HIGH (ref 3–11)
BILIRUBIN TOTAL: 0.39 mg/dL (ref 0.20–1.20)
BUN: 20.6 mg/dL (ref 7.0–26.0)
CO2: 23 mEq/L (ref 22–29)
Calcium: 9.4 mg/dL (ref 8.4–10.4)
Chloride: 101 mEq/L (ref 98–109)
Creatinine: 1 mg/dL (ref 0.6–1.1)
EGFR: 83 mL/min/{1.73_m2} — ABNORMAL LOW (ref >90)
Glucose: 128 mg/dl (ref 70–140)
Potassium: 3.8 mEq/L (ref 3.5–5.1)
SODIUM: 136 meq/L (ref 136–145)
Total Protein: 7.6 g/dL (ref 6.4–8.3)

## 2014-10-22 LAB — CBC WITH DIFFERENTIAL/PLATELET
BASO%: 0.3 % (ref 0.0–2.0)
BASOS ABS: 0 10*3/uL (ref 0.0–0.1)
EOS ABS: 0.1 10*3/uL (ref 0.0–0.5)
EOS%: 3.1 % (ref 0.0–7.0)
HEMATOCRIT: 36 % (ref 34.8–46.6)
HEMOGLOBIN: 12 g/dL (ref 11.6–15.9)
LYMPH#: 0.6 10*3/uL — AB (ref 0.9–3.3)
LYMPH%: 15.1 % (ref 14.0–49.7)
MCH: 29.8 pg (ref 25.1–34.0)
MCHC: 33.2 g/dL (ref 31.5–36.0)
MCV: 89.9 fL (ref 79.5–101.0)
MONO#: 0.4 10*3/uL (ref 0.1–0.9)
MONO%: 9.9 % (ref 0.0–14.0)
NEUT%: 71.6 % (ref 38.4–76.8)
NEUTROS ABS: 2.7 10*3/uL (ref 1.5–6.5)
Platelets: 239 10*3/uL (ref 145–400)
RBC: 4.01 10*6/uL (ref 3.70–5.45)
RDW: 13.2 % (ref 11.2–14.5)
WBC: 3.8 10*3/uL — ABNORMAL LOW (ref 3.9–10.3)

## 2014-10-22 MED ORDER — SODIUM CHLORIDE 0.9 % IJ SOLN
10.0000 mL | INTRAMUSCULAR | Status: DC | PRN
  Administered 2014-10-22: 10 mL via INTRAVENOUS
  Filled 2014-10-22: qty 10

## 2014-10-22 MED ORDER — VENLAFAXINE HCL ER 37.5 MG PO TB24: 1.0000 | ORAL_TABLET | Freq: Every day | ORAL | Status: DC

## 2014-10-22 MED ORDER — HEPARIN SOD (PORK) LOCK FLUSH 100 UNIT/ML IV SOLN
500.0000 [IU] | Freq: Once | INTRAVENOUS | Status: AC
  Administered 2014-10-22: 500 [IU] via INTRAVENOUS
  Filled 2014-10-22: qty 5

## 2014-10-22 NOTE — Telephone Encounter (Signed)
Gave avs & calendar for June, August, September.

## 2014-10-22 NOTE — Addendum Note (Signed)
Addended by: Marcelino Duster on: 10/22/2014 03:20 PM   Modules accepted: Orders

## 2014-10-22 NOTE — Patient Instructions (Signed)

## 2014-10-22 NOTE — Progress Notes (Signed)
[place]  Telephone:([phone]) [phone] Fax:([phone]) [phone]    ID: Shelly Hall OB: [birth year]  MR#: [id number]  FIE#:[id number]  PCP: Elyn Peers, MD GYN:  [name] SU: [name] OTHER MD: Thea Silversmith  CHIEF COMPLAINT:  Left Breast Cancer, estrogen receptor positive CURRENT TREATMENT:  To begin antiestrogen therapy  BREAST CANCER HISTORY: From the original intake note:  Shelly Hall palpated a mass in her left breast mid-April 2015 and immediately brought it to Dr. Benjie Karvonen' attention. She was set up for bilateral screening mammography with tomography at [facility] hospital 09/19/2013. The possible distortion in the left breast and a nearby group of calcifications was felt to warrant further evaluation, and on 09/30/2013 she underwent unilateral left diagnostic mammography and ultrasonography. There was an irregular mass in the outer left breast associated with microcalcifications. 4 cm anterior to this there was another cluster of pleomorphic calcifications spanning 7 mm. He had more anteriorly there was an irregular mass associated with other calcifications. In total the abnormalities in the left breast spent approximately 10 cm, and is segmental distribution. On exam there was a palpable firm mass at the 2:30 o'clock position of the left breast 10 cm from the nipple. There was palpable adenopathy in the inferior left axilla. Ultrasound confirmed an irregular hypoechoic mass in the left breast measuring 4.6 cm. In addition, a 1.4 cm oval slightly irregular mass was noted at the 3:00 position and yet another mass measured 9 mm in the same area. Ultrasound of the left axilla showed a dominant 3.6 cm lymph node.  Biopsy of 2 of the left breast masses and the suspicious left breast lymph node on 10/03/2013 showed ([id number]) an invasive ductal carcinoma, grade 2, estrogen receptor 90% positive with strong staining intensity, progesterone receptor 40% positive but moderate staining  intensity, with an MIB-1 of 20% and no HER-2 amplification. (Because all 3 biopsies were morphologically identical, only one prognostic panel was sent).  On 10/10/2013 the patient underwent bilateral breast MRI. This showed numerous enhancing masses in the left breast, the largest measuring 3.5 cm, and the aggregate measuring 12.7 cm. There were numerous enlarged left axillary lymph nodes, at least 5 of which were enlarged, both at levels 1 and 2. The largest measured 2.3 cm. The right breast was negative.  The patient's subsequent history is as detailed below.  INTERVAL HISTORY: Shelly Hall returns today for follow up of her breast cancer. She was supposed to start her tamoxifen in April, but did not actually begin until 5/7. She has some mild stiffness already that she thinks might be related to this, but she does have a history of chronic joint pain. Her hot flashes are mildly increased. She takes 668m gabapentin QHS which does help but doesn't eliminate them entirely.  REVIEW OF SYSTEMS: Shelly Hall fevers, chills, nausea, vomiting, or changes in bowel or bladder habits. She is eating and drinking well. She denies shortness of breath, chest pain, cough, or palpitations. Her residual neuropathy has completely resolved and she is no longer taking the b-complex vitamins. She is not exercising regularly.  A detailed review of systems is otherwise stable.  PAST MEDICAL HISTORY: Past Medical History  Diagnosis Date  . Hypertension   . Sarcoidosis   . Anxiety   . Depression   . Anemia   . Complication of anesthesia     makes pt. restless and unable to be still  . High cholesterol   . Heart murmur   . Thyroid cancer 2015  .  Type II diabetes mellitus   . Migraine     "in the past; none in years" (04/23/2014)  . Breast cancer 2015    left upper outer;  had chemo  . Radiation 06/23/14-08/11/14    Invasive ductal ca o left breast    PAST SURGICAL HISTORY: Past Surgical History  Procedure  Laterality Date  . Tubal ligation  2001  . Endobronchial ultrasound Bilateral 12/02/2012    Procedure: ENDOBRONCHIAL ULTRASOUND;  Surgeon: Collene Gobble, MD;  Location: [facility];  Service: Cardiopulmonary;  Laterality: Bilateral;  . Portacath placement N/A 10/22/2013    Procedure: INSERTION PORT-A-CATH;  Surgeon: [name], MD;  Location: [place];  Service: General;  Laterality: N/A;  . Lung biopsy    . Mastectomy w/ sentinel node biopsy Left 04/23/2014    & axillary LND  . Breast biopsy Left 2015 X 3  . Mastectomy w/ sentinel node biopsy Left 04/23/2014    Procedure: LEFT BREAST MASTECTOMY WITH SENTINEL LYMPH NODE BIOPSY AND AXILLARY LYMPH NODE DISECTION;  Surgeon: [name], MD;  Location: [place];  Service: General;  Laterality: Left;  . Thyroid lobectomy Left 04/23/2014    Procedure: LEFT THYROID LOBECTOMY;  Surgeon: [name], MD;  Location: [facility];  Service: General;  Laterality: Left;    FAMILY HISTORY Family History  Problem Relation Age of Onset  . Cancer Paternal Aunt     "bone cancer"; deceased 51s  . Cancer Paternal Uncle     stomach cancer; deceased 24s  . Cancer Paternal Aunt     unk. primary; currently 57s  . Prostate cancer Maternal Grandfather 72  . Asthma Mother   The patient's parents are living, in fair mid to late 26s. The patient had one brother, no sisters. There is no history of breast or ovarian cancer in the family to her knowledge.   GYNECOLOGIC HISTORY:   (Reviewed 11/27/2013)  menarche age 58, first live birth at 19. She is GX P2. She was still having regular periods at the time of the start of her chemotherapy in May 2015.  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 She is single and lives alone, although her mother is currently staying with her. Her son Shelly Hall lives in [place] and works at [employer]. Son Shelly Hall lives in [place] where he works at a [employer]. The  patient has 1 grandchild born in Sept 2015, by her son, Shelly Cowing. She is a Psychologist, forensic.    ADVANCED DIRECTIVES: not in place   HEALTH MAINTENANCE: (Updated 11/27/2013) History  Substance Use Topics  . Smoking status: Current Some Day Smoker -- 0.25 packs/day for 24 years    Types: Cigarettes  . Smokeless tobacco: Never Used     Comment: 04/23/2014 "using vapor; down to 3-4 cigarettes/day now"  . Alcohol Use: 6.0 oz/week    4 Cans of beer, 4 Shots of liquor, 2 Standard drinks or equivalent per week     Colonoscopy: Never  PAP: Not on file  Bone density: Not on file  Lipid panel:  Not on file  Allergies  Allergen Reactions  . Nyquil Multi-Symptom [Pseudoeph-Doxylamine-Dm-Apap] Itching and Other (See Comments)    Skin peels on hands, and feet    Current Outpatient Prescriptions  Medication Sig Dispense Refill  . atenolol-chlorthalidone (TENORETIC) 50-25 MG per tablet Take 1 tablet by mouth daily. 30 tablet 1  . emollient (BIAFINE) cream Apply 1 application topically 2 (two) times daily.    Marland Kitchen  ferrous sulfate 325 (65 FE) MG tablet Take 325 mg by mouth daily with breakfast.     . gabapentin (NEURONTIN) 300 MG capsule Take 2 capsules (600 mg total) by mouth at bedtime. 60 capsule 5  . gemfibrozil (LOPID) 600 MG tablet Take 600 mg by mouth 2 (two) times daily before a meal.    . metFORMIN (GLUCOPHAGE) 1000 MG tablet 1,000 mg 2 (two) times daily with a meal.     . Multiple Vitamin (MULTIVITAMIN WITH MINERALS) TABS tablet Take 1 tablet by mouth daily.    . pantoprazole (PROTONIX) 40 MG tablet Take 1 tablet (40 mg total) by mouth daily. 30 tablet 5  . potassium chloride SA (K-DUR,KLOR-CON) 20 MEQ tablet Take 20 mEq by mouth daily.    . tamoxifen (NOLVADEX) 20 MG tablet Take 1 tablet (20 mg total) by mouth daily. (Patient taking differently: Take 20 mg by mouth daily. Pt started med on 10/10/2014.) 30 tablet 3  . albuterol (PROVENTIL HFA;VENTOLIN HFA) 108 (90 BASE) MCG/ACT inhaler Inhale 2 puffs  into the lungs every 6 (six) hours as needed for wheezing or shortness of breath.    . docusate sodium (COLACE) 100 MG capsule Take 300 mg by mouth daily.     . Venlafaxine HCl 37.5 MG TB24 Take 1 tablet (37.5 mg total) by mouth daily. 30 each 6   No current facility-administered medications for this visit.    OBJECTIVE: Middle-aged [place] American woman in no acute distress Filed Vitals:   10/22/14 1028  BP: 133/91  Pulse: 68  Temp: 97.6 F (36.4 C)  Resp: 18     Body mass index is 32.57 kg/(m^2).    ECOG FS:1 - Symptomatic but completely ambulatory   Skin: warm, dry  HEENT: sclerae anicteric, conjunctivae pink, oropharynx clear. No thrush or mucositis.  Lymph Nodes: No cervical or supraclavicular lymphadenopathy  Lungs: clear to auscultation bilaterally, no rales, wheezes, or rhonci  Heart: regular rate and rhythm  Abdomen: round, soft, non tender, positive bowel sounds  Musculoskeletal: No focal spinal tenderness, no peripheral edema  Neuro: non focal, well oriented, positive affect  Breasts: left breast status post lumpectomy and radiation. Residual hyperpigmentation present. No evidence of recurrent disease. Left axilla benign. Right breast pendulous, but otherwise unremarkable.  LAB RESULTS:   Lab Results  Component Value Date   WBC 3.8* 10/22/2014   NEUTROABS 2.7 10/22/2014   HGB 12.0 10/22/2014   HCT 36.0 10/22/2014   MCV 89.9 10/22/2014   PLT 239 10/22/2014      Chemistry      Component Value Date/Time   NA 136 10/22/2014 1007   NA 140 04/25/2014 0509   K 3.8 10/22/2014 1007   K 4.7 04/25/2014 0509   CL 101 04/25/2014 0509   CO2 23 10/22/2014 1007   CO2 25 04/25/2014 0509   BUN 20.6 10/22/2014 1007   BUN 10 04/25/2014 0509   CREATININE 1.0 10/22/2014 1007   CREATININE 1.10 04/25/2014 0509      Component Value Date/Time   CALCIUM 9.4 10/22/2014 1007   CALCIUM 9.1 04/25/2014 0509   ALKPHOS 90 10/22/2014 1007   ALKPHOS 81 11/27/2012 0900   AST 16  10/22/2014 1007   AST 22 11/27/2012 0900   ALT 16 10/22/2014 1007   ALT 17 11/27/2012 0900   BILITOT 0.39 10/22/2014 1007   BILITOT 0.1* 11/27/2012 0900      STUDIES: No results found.   ASSESSMENT: 44 y.o. BRCA negative [place] woman with a history of  sarcoidosis and breast cancer as follows:  (1)  status post left breast and left axillary lymph node biopsy 10/03/2013, both positive for a clinical T2 N2, stage IIIA invasive ductal carcinoma, grade 2, estrogen and progesterone receptor positive, HER-2 negative, with an MIB-1 of 20%.  (2) Treated with neoadjuvant chemotherapy consisting of doxorubicin and cyclophosphamide in dose dense fashion x4, completed 12/11/2013, followed by paclitaxel weekly x12 (dose reduced by 20% at cycle 5 because of neuropathy concerns) completed 03/19/2014.  (3) status post left mastectomy and left axillary lymph node dissection 04/23/2014 for a pT1c pN2, stage IIIA invasive ductal carcinoma, grade 3, estrogen receptor 99% positive, progesterone receptor 4% positive, with no HER-2 amplification  (3) adjuvant radiation completed 08/11/2014  (4) tamoxifen started 10/10/14  (5) continuing tobacco abuse: The patient has been strongly advised to discontinue smoking  (6) genetic testing sent 10/16/2013 did not reveal any deleterious mutation in any of these genes. The genes tested were ATM, BARD1, BRCA1, BRCA2, BRIP1, CDH1, CHEK2, MRE11A, MUTYH, NBN, NF1, PALB2, PTEN, RAD50, RAD51C, RAD51D, and TP53.  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 The labs were reviewed in detail and were entirely stable. She is tolerating the tamoxifen well enough to continue and see if it will be a good fit for her. She is scheduled for a hysterectomy on 7/6 because of fibroids. She is afraid that her hot flashes will become  worse than they are as the gabapentin is only moderately effective. We discussed starting venlafaxine 37.49m daily, and she was agreeable. She knows not to stop this drug cold tKuwaitshould it not work out, but to call and ask for instructions on how to wean down.   Shelly Hall meets with her plastic surgeon in August, but the idea of a right breast reduction is still in the air because of insurance issues. She will keep her port until a decision is made. She will continue to have this flushed every 6 weeks. She is due for a right mammogram this month, so I have placed the appropriate orders.  Shelly Hall return in September for labs and a follow up visit. She understands and agrees with this plan. She knows the goal of treatment in her cas is cure. She has been encouraged to call with any issues that might arise before her next visit here.  Shelly Panda NP 10/22/2014 11:32 AM

## 2014-10-26 ENCOUNTER — Encounter: Payer: Self-pay | Admitting: Oncology

## 2014-10-26 NOTE — Progress Notes (Signed)
I resent req for auth for venlafaxine [facility] via covermymeds

## 2014-10-28 ENCOUNTER — Telehealth: Payer: Self-pay | Admitting: Oncology

## 2014-10-28 NOTE — Telephone Encounter (Signed)
Confirmed appointment for [facility] 05/31.

## 2014-11-03 ENCOUNTER — Ambulatory Visit
Admission: RE | Admit: 2014-11-03 | Discharge: 2014-11-03 | Disposition: A | Discharge status: Home / Self Care | Payer: Medicaid Other | Source: Ambulatory Visit | Attending: Nurse Practitioner | Admitting: Nurse Practitioner

## 2014-11-03 DIAGNOSIS — C50412 Malignant neoplasm of upper-outer quadrant of left female breast: Secondary | ICD-10-CM

## 2014-11-16 ENCOUNTER — Telehealth: Payer: Self-pay | Admitting: *Deleted

## 2014-11-16 ENCOUNTER — Other Ambulatory Visit: Payer: Self-pay | Admitting: *Deleted

## 2014-11-16 DIAGNOSIS — C50412 Malignant neoplasm of upper-outer quadrant of left female breast: Secondary | ICD-10-CM

## 2014-11-16 NOTE — Telephone Encounter (Signed)
ON 11/12/14 PT. Shelly Hall HAVING SHARP UNDERARM PAIN AT A SEVEN WITH MOVEMENT. NOW THE PAIN GOES DOWN THE INSIDE OF ARM PAST THE ELBOW. THE ARM IS SWOLLEN AND PT. IS UNABLE TO GET HER SLEEVE ON DUE TO THE PAIN WITH MOVEMENT. NO REDNESS OR INCREASED WARMTH. WHEN PT. TAKES TYLENOL AND DOES NOT MOVE HER ARM THE PAIN GOES AWAY. VERBAL ORDER AND READ BACK TO CYNDEE BACON,NP- PT. TO GET A DOPPLER TOMORROW AND SEE CYNDEE BACON,NP. NOTIFIED PT.THAT HER DOPPLER IS AT [facility] ON 11/17/14 AT 9:00AM. PT. TO BE IN ADMITTING AT 8:45AM. SHE WILL COME TO SEE CYNDEE BACON,NP AT 10:00AM. PT. VOICES UNDERSTANDING. POF TO THE SCHEDULER.

## 2014-11-17 ENCOUNTER — Ambulatory Visit (HOSPITAL_BASED_OUTPATIENT_CLINIC_OR_DEPARTMENT_OTHER): Payer: Medicaid Other | Admitting: Nurse Practitioner

## 2014-11-17 ENCOUNTER — Telehealth: Payer: Self-pay | Admitting: *Deleted

## 2014-11-17 ENCOUNTER — Other Ambulatory Visit: Payer: Self-pay | Admitting: *Deleted

## 2014-11-17 ENCOUNTER — Encounter: Payer: Self-pay | Admitting: Nurse Practitioner

## 2014-11-17 ENCOUNTER — Ambulatory Visit (HOSPITAL_COMMUNITY)
Admission: RE | Admit: 2014-11-17 | Discharge: 2014-11-17 | Disposition: A | Discharge status: Home / Self Care | Payer: Medicaid Other | Source: Ambulatory Visit | Attending: Nurse Practitioner | Admitting: Nurse Practitioner

## 2014-11-17 VITALS — BP 128/70 | HR 62 | Temp 97.7°F | Resp 18 | Wt 186.6 lb

## 2014-11-17 DIAGNOSIS — M7989 Other specified soft tissue disorders: Secondary | ICD-10-CM | POA: Insufficient documentation

## 2014-11-17 DIAGNOSIS — C50412 Malignant neoplasm of upper-outer quadrant of left female breast: Secondary | ICD-10-CM

## 2014-11-17 DIAGNOSIS — M79602 Pain in left arm: Secondary | ICD-10-CM | POA: Diagnosis not present

## 2014-11-17 DIAGNOSIS — I890 Lymphedema, not elsewhere classified: Secondary | ICD-10-CM | POA: Diagnosis present

## 2014-11-17 MED ORDER — HYDROCODONE-ACETAMINOPHEN 5-325 MG PO TABS: 1.0000 | ORAL_TABLET | Freq: Four times a day (QID) | ORAL | Status: DC | PRN

## 2014-11-17 MED ORDER — GABAPENTIN 300 MG PO CAPS: ORAL_CAPSULE | ORAL | Status: DC

## 2014-11-17 NOTE — Assessment & Plan Note (Signed)
Patient reports a one-week history of left upper extremity edema, and mild discomfort.  She denies any known injury or trauma to the area.  She denies any chest pain, chest pressure, shortness breath, or pain with inspiration.  She denies any recent fevers or chills.  On exam.-There is no erythema, warmth, red streaks, or obvious injury/trauma.  Patient observed with full range of motion of the left upper extremity as well.  With careful examination-there is trace lymphedema to the left upper arm above the elbow and directly below her elbow.  Patient does have some tenderness with palpation at site as well.  Doppler ultrasound obtained today was negative for DVT.  Advised patient that this trace edema and mild tenderness is most likely actually lymphedema.  Patient states she already does lymphedema massage to the left upper extremity 2-3 times per day; and only occasionally wears her compression sleeve.  Advised the patient continue with her lymphedema massage; and to start wearing her compression sleeve as previously directed.  Also, gave patient a prescription for hydrocodone to try to see if that helps.  Advised the patient.  Alternate the hydrocodone with some occasional ibuprofen as well.  Instructed patient to call/return if symptoms worsen.  Also advised patient she may very well need to follow back up with the lymphedema clinic if lymphedema does increase.

## 2014-11-17 NOTE — Progress Notes (Signed)
SYMPTOM MANAGEMENT CLINIC   HPI: Shelly Hall 44 y.o. female diagnosed with breast cancer.  Patient is status post neo-adjuvant chemotherapy completed in October 2015, left breast mastectomy on 04/23/2014, radiation therapy completed on 08/11/2014, and initiation of tamoxifen therapy on 10/10/2014.  She has been tolerating the tamoxifen fairly well; with her only complaints being some mild increased hot flashes and some increased generalized joint pain.  Patient has a history of chronic uterine fibroids; with some chronic vaginal spotting.  She is scheduled for a complete hysterectomy on 12/09/2014.  Patient reports a one-week history of left upper extremity edema, and mild discomfort.  She denies any known injury or trauma to the area.  She denies any chest pain, chest pressure, shortness breath, or pain with inspiration.  She denies any recent fevers or chills.  HPI  ROS  Past Medical History  Diagnosis Date  . Hypertension   . Sarcoidosis   . Anxiety   . Depression   . Anemia   . Complication of anesthesia     makes pt. restless and unable to be still  . High cholesterol   . Heart murmur   . Thyroid cancer 2015  . Type II diabetes mellitus   . Migraine     "in the past; none in years" (04/23/2014)  . Breast cancer 2015    left upper outer;  had chemo  . Radiation 06/23/14-08/11/14    Invasive ductal ca o left breast    Past Surgical History  Procedure Laterality Date  . Tubal ligation  2001  . Endobronchial ultrasound Bilateral 12/02/2012    Procedure: ENDOBRONCHIAL ULTRASOUND;  Surgeon: Collene Gobble, MD;  Location: [facility];  Service: Cardiopulmonary;  Laterality: Bilateral;  . Portacath placement N/A 10/22/2013    Procedure: INSERTION PORT-A-CATH;  Surgeon: [name], MD;  Location: [place];  Service: General;  Laterality: N/A;  . Lung biopsy    . Mastectomy w/ sentinel node biopsy Left 04/23/2014    & axillary LND  . Breast biopsy Left 2015 X 3  .  Mastectomy w/ sentinel node biopsy Left 04/23/2014    Procedure: LEFT BREAST MASTECTOMY WITH SENTINEL LYMPH NODE BIOPSY AND AXILLARY LYMPH NODE DISECTION;  Surgeon: [name], MD;  Location: [place];  Service: General;  Laterality: Left;  . 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    is allergic to nyquil multi-symptom.    Medication List       This list is accurate as of: 11/17/14 11:03 AM.  Always use your most recent med list.               albuterol 108 (90 BASE) MCG/ACT inhaler  Commonly known as:  PROVENTIL HFA;VENTOLIN HFA  Inhale 2 puffs into the lungs every 6 (six) hours as needed for wheezing or shortness of breath.     atenolol-chlorthalidone 50-25 MG per tablet  Commonly known as:  TENORETIC  Take 1 tablet by mouth daily.     docusate sodium 100 MG capsule  Commonly known as:  COLACE  Take 300 mg by mouth daily.     emollient cream  Commonly known as:  BIAFINE  Apply 1 application topically 2 (two) times daily.     ferrous sulfate 325 (65 FE) MG tablet  Take 325 mg by mouth daily with breakfast.     gabapentin 300 MG capsule  Commonly known as:  NEURONTIN  Take 2 capsules (600 mg total) by mouth at bedtime.     gemfibrozil 600 MG tablet  Commonly known as:  LOPID  Take 600 mg by mouth 2 (two) times daily before a meal.     HYDROcodone-acetaminophen 5-325 MG per tablet  Commonly known  as:  NORCO/VICODIN  Take 1-2 tablets by mouth every 6 (six) hours as needed for moderate pain.     metFORMIN 1000 MG tablet  Commonly known as:  GLUCOPHAGE  1,000 mg 2 (two) times daily with a meal.     multivitamin with minerals Tabs tablet  Take 1 tablet by mouth daily.     pantoprazole 40 MG tablet  Commonly known as:  PROTONIX  Take 1 tablet (40 mg total) by mouth daily.     potassium chloride SA 20 MEQ tablet  Commonly known as:  K-DUR,KLOR-CON  Take 20 mEq by mouth daily.     tamoxifen 20 MG tablet  Commonly known as:  NOLVADEX  Take 1 tablet (20 mg total) by mouth daily.     Venlafaxine HCl 37.5 MG Tb24  Take 1 tablet (37.5 mg total) by mouth daily.         PHYSICAL EXAMINATION  Oncology Vitals 11/17/2014 10/22/2014 09/17/2014 09/10/2014 08/11/2014 08/04/2014 07/21/2014  Height - 160 cm - 160 cm - - -  Weight 84.641 kg 83.371 kg 83.643 kg 83.235 kg - 86.274 kg 84.913 kg  Weight (lbs) 186 lbs 10 oz 183 lbs 13 oz 184 lbs 6 oz 183 lbs 8 oz - 190 lbs 3 oz 187 lbs 3 oz  BMI (kg/m2) - 32.56 kg/m2 - 32.51 kg/m2 - - -  Temp 97.7 97.6 98.1 97.6 98.1 98 -  Pulse 62 68 108 92 82 92 -  Resp 18 18 - 18 - - -  SpO2 99 100 - 100 - - -  BSA (m2) - 1.93 m2 - 1.92 m2 - - -   BP Readings from Last 3 Encounters:  11/17/14 128/70  10/22/14 133/91  09/17/14 106/70    Physical Exam  Constitutional: She is oriented to person, place, and time and well-developed, well-nourished, and in no distress.  HENT:  Head: Normocephalic and atraumatic.  Eyes: Conjunctivae and EOM are normal. Pupils are equal, round, and reactive to light. Right eye exhibits no discharge. Left eye exhibits no discharge. No scleral icterus.  Neck: Normal range of motion.  Pulmonary/Chest: Effort normal. No respiratory distress.  Musculoskeletal: Normal range of motion. She exhibits edema and tenderness.  On exam.-There is no erythema, warmth, red streaks, or obvious injury/trauma.  Patient observed with full range of  motion of the left upper extremity as well.  With careful examination-there is trace lymphedema to the left upper arm above the elbow and directly below her elbow.  Patient does have some tenderness with palpation at site as well.      Neurological: She is alert and oriented to person, place, and time. Gait normal.  Skin: Skin is warm and dry. No rash noted. No erythema. No pallor.  Psychiatric: Affect normal.  Nursing note and vitals reviewed.  LABORATORY DATA:. No visits with results within 3 Day(s) from this visit. Latest known visit with results is:  Appointment on 10/22/2014  Component Date Value Ref Range Status  . WBC 10/22/2014 3.8* 3.9 - 10.3 10e3/uL Final  . NEUT# 10/22/2014 2.7  1.5 - 6.5 10e3/uL Final  . HGB 10/22/2014 12.0  11.6 - 15.9 g/dL Final  . HCT 10/22/2014 36.0  34.8 - 46.6 % Final  . Platelets 10/22/2014 239  145 - 400 10e3/uL Final  . MCV 10/22/2014 89.9  79.5 - 101.0 fL Final  . MCH 10/22/2014 29.8  25.1 - 34.0 pg Final  . MCHC 10/22/2014 33.2  31.5 - 36.0 g/dL Final  . RBC 10/22/2014 4.01  3.70 - 5.45 10e6/uL Final  . RDW 10/22/2014 13.2  11.2 - 14.5 % Final  . lymph# 10/22/2014 0.6* 0.9 - 3.3 10e3/uL Final  . MONO# 10/22/2014 0.4  0.1 - 0.9 10e3/uL Final  . Eosinophils Absolute 10/22/2014 0.1  0.0 - 0.5 10e3/uL Final  . Basophils Absolute 10/22/2014 0.0  0.0 - 0.1 10e3/uL Final  . NEUT% 10/22/2014 71.6  38.4 - 76.8 % Final  . LYMPH% 10/22/2014 15.1  14.0 - 49.7 % Final  . MONO% 10/22/2014 9.9  0.0 - 14.0 % Final  . EOS% 10/22/2014 3.1  0.0 - 7.0 % Final  . BASO% 10/22/2014 0.3  0.0 - 2.0 % Final  . Sodium 10/22/2014 136  136 - 145 mEq/L Final  . Potassium 10/22/2014 3.8  3.5 - 5.1 mEq/L Final  . Chloride 10/22/2014 101  98 - 109 mEq/L Final  . CO2 10/22/2014 23  22 - 29 mEq/L Final  . Glucose 10/22/2014 128  70 - 140 mg/dl Final  . BUN 10/22/2014 20.6  7.0 - 26.0 mg/dL Final  . Creatinine 10/22/2014 1.0  0.6 - 1.1 mg/dL Final  . Total Bilirubin  10/22/2014 0.39  0.20 - 1.20 mg/dL Final  . Alkaline Phosphatase 10/22/2014 90  40 - 150 U/L Final  . AST 10/22/2014 16  5 - 34 U/L Final  . ALT 10/22/2014 16  0 - 55 U/L Final  . Total Protein 10/22/2014 7.6  6.4 - 8.3 g/dL Final  . Albumin 10/22/2014 3.6  3.5 - 5.0 g/dL Final  . Calcium 10/22/2014 9.4  8.4 - 10.4 mg/dL Final  . Anion Gap 10/22/2014 12* 3 - 11 mEq/L Final  . EGFR 10/22/2014 83* >90 ml/min/1.73 m2 Final   eGFR is calculated using the CKD-EPI Creatinine Equation (2009)   Doppler US: VASCULAR LAB PRELIMINARY PRELIMINARY PRELIMINARY PRELIMINARY  Left upper extremity venous duplex completed.   Preliminary report: Left : No evidence of DVT or superficial thrombosis.    RADIOGRAPHIC STUDIES: No results found.  ASSESSMENT/PLAN:    Breast cancer of upper-outer quadrant of left female breast Patient is status post neo-adjuvant chemotherapy completed in October 2015, left breast mastectomy on 04/23/2014, radiation therapy completed on 08/11/2014, and initiation of tamoxifen therapy on 10/10/2014.  She has been tolerating the tamoxifen fairly well; with her only complaints being some mild increased hot flashes and some increased generalized joint pain.  Patient has a history of chronic uterine fibroids; with some chronic vaginal spotting.  She is scheduled for a complete hysterectomy on 12/09/2014.  Patient is scheduled to return on 02/18/2015 for labs and a follow-up visit.  She will also continue with regular Port-A-Cath flushes as well.  Lymphedema Patient reports a one-week history of left upper extremity edema, and mild discomfort.  She denies any known  injury or trauma to the area.  She denies any chest pain, chest pressure, shortness breath, or pain with inspiration.  She denies any recent fevers or chills.  On exam.-There is no erythema, warmth, red streaks, or obvious injury/trauma.  Patient observed with full range of motion of the left upper extremity as well.   With careful examination-there is trace lymphedema to the left upper arm above the elbow and directly below her elbow.  Patient does have some tenderness with palpation at site as well.  Doppler ultrasound obtained today was negative for DVT.  Advised patient that this trace edema and mild tenderness is most likely actually lymphedema.  Patient states she already does lymphedema massage to the left upper extremity 2-3 times per day; and only occasionally wears her compression sleeve.  Advised the patient continue with her lymphedema massage; and to start wearing her compression sleeve as previously directed.  Also, gave patient a prescription for hydrocodone to try to see if that helps.  Advised the patient.  Alternate the hydrocodone with some occasional ibuprofen as well.  Instructed patient to call/return if symptoms worsen.  Also advised patient she may very well need to follow back up with the lymphedema clinic if lymphedema does increase.   Patient stated understanding of all instructions; and was in agreement with this plan of care. The patient knows to call the clinic with any problems, questions or concerns.   Review/collaboration with Dr. Jana Hakim regarding all aspects of patient's visit today.   Total time spent with patient was 25 minutes;  with greater than 75 percent of that time spent in face to face counseling regarding patient's symptoms,  and coordination of care and follow up.  Disclaimer: This note was dictated with voice recognition software. Similar sounding words can inadvertently be transcribed and may not be corrected upon review.   Drue Second, NP 11/17/2014

## 2014-11-17 NOTE — Assessment & Plan Note (Signed)
Patient is status post neo-adjuvant chemotherapy completed in October 2015, left breast mastectomy on 04/23/2014, radiation therapy completed on 08/11/2014, and initiation of tamoxifen therapy on 10/10/2014.  She has been tolerating the tamoxifen fairly well; with her only complaints being some mild increased hot flashes and some increased generalized joint pain.  Patient has a history of chronic uterine fibroids; with some chronic vaginal spotting.  She is scheduled for a complete hysterectomy on 12/09/2014.  Patient is scheduled to return on 02/18/2015 for labs and a follow-up visit.  She will also continue with regular Port-A-Cath flushes as well.

## 2014-11-17 NOTE — Telephone Encounter (Signed)
During OV with [facility] pt states that she needs the prior auth completed for venlafaxine that was prescribed during her last OV with Gentry Fitz, NP. Pt also indicated that the gabapentin was suppose to be increased but she has not received a new prescription. TC to Dr. Virgie Dad nurse to make aware.

## 2014-11-17 NOTE — Telephone Encounter (Signed)
Patient asking about pre auth of Effexor and if Gabapentin was going to be increased. Inbasket sent to [facility] to check on status of Effexor. Spoke with Nira Conn, patient may take an additional 300 mg of Gabapentin in am in addition to the 600 mg she is taking at night. Message relayed to Clarise Cruz who is seeing patient with Selena Lesser today.

## 2014-11-17 NOTE — Progress Notes (Signed)
I faxed [facility]ola office notes. She has to get to [facility] for review. They need to know was tried previous and failed. Need prior auth for effexor. I sent staff message to val and diana.

## 2014-11-17 NOTE — Progress Notes (Signed)
VASCULAR LAB PRELIMINARY  PRELIMINARY  PRELIMINARY  PRELIMINARY  Left upper extremity venous duplex completed.    Preliminary report:  Left :  No evidence of DVT or superficial thrombosis.    Bunny Kleist, RVS 11/17/2014, 9:46 AM

## 2014-11-26 ENCOUNTER — Telehealth: Payer: Self-pay

## 2014-11-26 NOTE — Telephone Encounter (Signed)
Pt called stating she had not received her gabapentin increased rx yet. Was seen to be e-scribed to [place]. Told pt to call [facility] and if a problem to call us back.

## 2014-12-01 NOTE — Patient Instructions (Addendum)
   Your procedure is scheduled on:  Wednesday, July 6  Enter through the [employer] of [facility] at: 8 AM Pick up the phone at the desk and dial [phone] and inform us of your arrival.  Please call this number if you have any problems the morning of surgery: [phone]  Remember: Do not eat or drink after midnight: Tuesday Take these medicines the morning of surgery with a SIP OF WATER:  Atenolol, gabapentin, protonix, tamoxifen.  Bring albuterol inhaler with you on day of surgery.  Instructed patient not to take metformin on Tuesday night and Wed. Morning dose-day of surgery.  We will check your sugar upon arrival to Short Stay Dept.  Do not wear jewelry, make-up, or FINGER nail polish No metal in your hair or on your body. Do not wear lotions, powders, perfumes.  You may wear deodorant.  Do not bring valuables to the hospital.  Leave suitcase in the car. After Surgery it may be brought to your room. For patients being admitted to the hospital, checkout time is 11:00am the day of discharge.  Home with mother Shelly Hall cell [phone]

## 2014-12-02 ENCOUNTER — Encounter (HOSPITAL_COMMUNITY): Payer: Self-pay

## 2014-12-02 ENCOUNTER — Other Ambulatory Visit: Payer: Self-pay

## 2014-12-02 ENCOUNTER — Encounter (HOSPITAL_COMMUNITY)
Admission: RE | Admit: 2014-12-02 | Discharge: 2014-12-02 | Disposition: A | Discharge status: Home / Self Care | Payer: Medicaid Other | Source: Ambulatory Visit | Attending: Obstetrics and Gynecology | Admitting: Obstetrics and Gynecology

## 2014-12-02 DIAGNOSIS — C50412 Malignant neoplasm of upper-outer quadrant of left female breast: Secondary | ICD-10-CM | POA: Diagnosis not present

## 2014-12-02 DIAGNOSIS — Z01818 Encounter for other preprocedural examination: Secondary | ICD-10-CM | POA: Insufficient documentation

## 2014-12-02 DIAGNOSIS — D219 Benign neoplasm of connective and other soft tissue, unspecified: Secondary | ICD-10-CM | POA: Diagnosis not present

## 2014-12-02 HISTORY — DX: Bronchitis, not specified as acute or chronic: J40

## 2014-12-02 HISTORY — DX: Gastro-esophageal reflux disease without esophagitis: K21.9

## 2014-12-02 LAB — COMPREHENSIVE METABOLIC PANEL
ALT: 23 U/L (ref 14–54)
AST: 24 U/L (ref 15–41)
Albumin: 4 g/dL (ref 3.5–5.0)
Alkaline Phosphatase: 76 U/L (ref 38–126)
Anion gap: 6 (ref 5–15)
BUN: 14 mg/dL (ref 6–20)
CHLORIDE: 109 mmol/L (ref 101–111)
CO2: 27 mmol/L (ref 22–32)
Calcium: 9.4 mg/dL (ref 8.9–10.3)
Creatinine, Ser: 1.03 mg/dL — ABNORMAL HIGH (ref 0.44–1.00)
GFR calc Af Amer: >60 mL/min (ref >60)
Glucose, Bld: 173 mg/dL — ABNORMAL HIGH (ref 65–99)
Potassium: 4.9 mmol/L (ref 3.5–5.1)
SODIUM: 142 mmol/L (ref 135–145)
Total Bilirubin: 0.2 mg/dL — ABNORMAL LOW (ref 0.3–1.2)
Total Protein: 8 g/dL (ref 6.5–8.1)

## 2014-12-02 LAB — CBC
HEMATOCRIT: 35.4 % — AB (ref 36.0–46.0)
HEMOGLOBIN: 11.8 g/dL — AB (ref 12.0–15.0)
MCH: 30.3 pg (ref 26.0–34.0)
MCHC: 33.3 g/dL (ref 30.0–36.0)
MCV: 91 fL (ref 78.0–100.0)
Platelets: 249 10*3/uL (ref 150–400)
RBC: 3.89 MIL/uL (ref 3.87–5.11)
RDW: 13.9 % (ref 11.5–15.5)
WBC: 4.8 10*3/uL (ref 4.0–10.5)

## 2014-12-03 ENCOUNTER — Ambulatory Visit (HOSPITAL_BASED_OUTPATIENT_CLINIC_OR_DEPARTMENT_OTHER): Payer: Medicaid Other

## 2014-12-03 VITALS — BP 129/92 | HR 69 | Temp 97.8°F | Resp 18

## 2014-12-03 DIAGNOSIS — C50412 Malignant neoplasm of upper-outer quadrant of left female breast: Secondary | ICD-10-CM | POA: Diagnosis not present

## 2014-12-03 DIAGNOSIS — Z452 Encounter for adjustment and management of vascular access device: Secondary | ICD-10-CM

## 2014-12-03 DIAGNOSIS — Z95828 Presence of other vascular implants and grafts: Secondary | ICD-10-CM

## 2014-12-03 MED ORDER — HEPARIN SOD (PORK) LOCK FLUSH 100 UNIT/ML IV SOLN
500.0000 [IU] | Freq: Once | INTRAVENOUS | Status: AC
  Administered 2014-12-03: 500 [IU] via INTRAVENOUS
  Filled 2014-12-03: qty 5

## 2014-12-03 MED ORDER — SODIUM CHLORIDE 0.9 % IJ SOLN
10.0000 mL | INTRAMUSCULAR | Status: DC | PRN
  Administered 2014-12-03: 10 mL via INTRAVENOUS
  Filled 2014-12-03: qty 10

## 2014-12-03 NOTE — Patient Instructions (Signed)

## 2014-12-08 NOTE — H&P (Signed)
Shelly Hall is an 44 y.o. female. She has a known h/o myomas, was going to have hysterectomy last year before she was diagnosed with breast cancer.  Was having regular menses monthly, heavy.  Since having chemotherapy for breast cancer, menses stopped, but recently having light bleeding daily.  Had benign pipelle and normal Pap with another group last year.  Due to her recent diagnosis of breast cancer, she wants to proceed with hysterectomy and BSO.    Pertinent Gynecological History: Last pap: normal Date: 40 OB History: G3, P2012 SVD x 2   Menstrual History: No LMP recorded. Patient is not currently having periods (Reason: Other).    Past Medical History  Diagnosis Date  . Hypertension   . Sarcoidosis     no problems per patient   . Anemia   . Complication of anesthesia     makes pt. restless and unable to be still  . High cholesterol   . Thyroid cancer 2015  . Type II diabetes mellitus   . Breast cancer 2015    left upper outer;  had chemo  . Radiation 06/23/14-08/11/14    Invasive ductal ca o left breast  . SVD (spontaneous vaginal delivery)     x 2  . Heart murmur     never had any problems as adult  . Bronchitis 2014  . Depression     no meds currently  . Anxiety     no meds currently  . GERD (gastroesophageal reflux disease)     Past Surgical History  Procedure Laterality Date  . Tubal ligation  2001  . Endobronchial ultrasound Bilateral 12/02/2012    Procedure: ENDOBRONCHIAL ULTRASOUND;  Surgeon: Collene Gobble, MD;  Location: [facility];  Service: Cardiopulmonary;  Laterality: Bilateral;  . Portacath placement N/A 10/22/2013    Procedure: INSERTION PORT-A-CATH;  Surgeon: [name], MD;  Location: [place];  Service: General;  Laterality: N/A;  . Lung biopsy  2014  . Mastectomy w/ sentinel node biopsy Left 04/23/2014    & axillary LND  . Mastectomy w/ sentinel node biopsy Left 04/23/2014    Procedure: LEFT BREAST MASTECTOMY WITH SENTINEL LYMPH NODE  BIOPSY AND AXILLARY LYMPH NODE DISECTION;  Surgeon: [name], MD;  Location: [place];  Service: General;  Laterality: Left;  . Thyroid lobectomy Left 04/23/2014    Procedure: LEFT THYROID LOBECTOMY;  Surgeon: [name], MD;  Location: [place];  Service: General;  Laterality: Left;  . Breast biopsy Left 2015 X 3    biopsy  . Wisdom tooth extraction    . Leep      Family History  Problem Relation Age of Onset  . Cancer Paternal Aunt     "bone cancer"; deceased 70s  . Cancer Paternal Uncle     stomach cancer; deceased 40s  . Cancer Paternal Aunt     unk. primary; currently 30s  . Prostate cancer Maternal Grandfather 9  . Asthma Mother     Social History:  reports that she has been smoking Cigarettes.  She has a 6.25 pack-year smoking history. She has never used smokeless tobacco. She reports that she drinks about 6.0 oz of alcohol per week. She reports that she does not use illicit drugs.  Allergies:  Allergies  Allergen Reactions  . Nyquil Multi-Symptom [Pseudoeph-Doxylamine-Dm-Apap] Itching and Other (See Comments)    Skin peels on hands, and feet    No prescriptions prior to admission    Review of Systems  Respiratory: Negative.  Cardiovascular: Negative.   Gastrointestinal: Negative.   Genitourinary: Negative.     There were no vitals taken for this visit. Physical Exam  Constitutional: She appears well-developed and well-nourished.  Neck: Neck supple. No thyromegaly present.  Cardiovascular: Normal rate, regular rhythm and normal heart sounds.   No murmur heard. Respiratory: Effort normal and breath sounds normal. No respiratory distress. She has no wheezes.  GI: Soft. She exhibits no distension and no mass. There is no tenderness.  Uterine fundus palpable just above pubic symphysis  Genitourinary: Vagina normal.  Uterus 10-12 weeks size, irregular No adnexal mass    No results found for this or any previous visit (from the past 24 hour(s)).  No results  found.  Assessment/Plan: Symptomatic myomatous uterus and recent diagnosis of breast cancer.  Medical and surgical options have been discussed, she wishes to proceed with definitive surgical therapy.  Surgical procedure, risks, chances of relieving symptoms have all been discussed.  Will admit for LAVH/BSO, possible cystoscopy.  Shelly Hall D 12/08/2014, 9:07 PM

## 2014-12-08 NOTE — Anesthesia Preprocedure Evaluation (Addendum)
Anesthesia Evaluation  Patient identified by MRN, date of birth, ID band Patient awake    Reviewed: Allergy & Precautions, NPO status , Patient's Chart, lab work & pertinent test results  History of Anesthesia Complications Negative for: history of anesthetic complications  Airway Mallampati: II  TM Distance: >3 FB Neck ROM: Full    Dental no notable dental hx. (+) Dental Advisory Given   Pulmonary Current Smoker,  Hx of sarcoidosis breath sounds clear to auscultation  Pulmonary exam normal       Cardiovascular hypertension, Pt. on medications and Pt. on home beta blockers Normal cardiovascular examRhythm:Regular Rate:Normal     Neuro/Psych PSYCHIATRIC DISORDERS Anxiety Depression negative neurological ROS     GI/Hepatic Neg liver ROS, GERD-  Medicated and Controlled,  Endo/Other  diabetes, Oral Hypoglycemic Agents  Renal/GU negative Renal ROS  negative genitourinary   Musculoskeletal negative musculoskeletal ROS (+)   Abdominal   Peds negative pediatric ROS (+)  Hematology  (+) anemia ,   Anesthesia Other Findings   Reproductive/Obstetrics negative OB ROS                            Anesthesia Physical Anesthesia Plan  ASA: III  Anesthesia Plan: General   Post-op Pain Management:    Induction: Intravenous  Airway Management Planned: Oral ETT  Additional Equipment:   Intra-op Plan:   Post-operative Plan: Extubation in OR  Informed Consent: I have reviewed the patients History and Physical, chart, labs and discussed the procedure including the risks, benefits and alternatives for the proposed anesthesia with the patient or authorized representative who has indicated his/her understanding and acceptance.   Dental advisory given  Plan Discussed with: CRNA  Anesthesia Plan Comments:         Anesthesia Quick Evaluation

## 2014-12-09 ENCOUNTER — Encounter (HOSPITAL_COMMUNITY)
Admission: RE | Disposition: A | Discharge status: Home / Self Care | Payer: Self-pay | Source: Ambulatory Visit | Attending: Obstetrics and Gynecology

## 2014-12-09 ENCOUNTER — Ambulatory Visit (HOSPITAL_COMMUNITY)
Admission: RE | Admit: 2014-12-09 | Discharge: 2014-12-10 | Disposition: A | Discharge status: Home / Self Care | Payer: Medicaid Other | Source: Ambulatory Visit | Attending: Obstetrics and Gynecology | Admitting: Obstetrics and Gynecology

## 2014-12-09 ENCOUNTER — Encounter (HOSPITAL_COMMUNITY): Payer: Self-pay | Admitting: *Deleted

## 2014-12-09 ENCOUNTER — Ambulatory Visit (HOSPITAL_COMMUNITY): Payer: Medicaid Other | Admitting: Anesthesiology

## 2014-12-09 DIAGNOSIS — Z923 Personal history of irradiation: Secondary | ICD-10-CM | POA: Insufficient documentation

## 2014-12-09 DIAGNOSIS — I10 Essential (primary) hypertension: Secondary | ICD-10-CM | POA: Diagnosis not present

## 2014-12-09 DIAGNOSIS — F419 Anxiety disorder, unspecified: Secondary | ICD-10-CM | POA: Insufficient documentation

## 2014-12-09 DIAGNOSIS — D869 Sarcoidosis, unspecified: Secondary | ICD-10-CM | POA: Insufficient documentation

## 2014-12-09 DIAGNOSIS — D259 Leiomyoma of uterus, unspecified: Secondary | ICD-10-CM | POA: Insufficient documentation

## 2014-12-09 DIAGNOSIS — Z9221 Personal history of antineoplastic chemotherapy: Secondary | ICD-10-CM | POA: Insufficient documentation

## 2014-12-09 DIAGNOSIS — K219 Gastro-esophageal reflux disease without esophagitis: Secondary | ICD-10-CM | POA: Insufficient documentation

## 2014-12-09 DIAGNOSIS — F329 Major depressive disorder, single episode, unspecified: Secondary | ICD-10-CM | POA: Insufficient documentation

## 2014-12-09 DIAGNOSIS — Z853 Personal history of malignant neoplasm of breast: Secondary | ICD-10-CM | POA: Diagnosis not present

## 2014-12-09 DIAGNOSIS — E119 Type 2 diabetes mellitus without complications: Secondary | ICD-10-CM | POA: Insufficient documentation

## 2014-12-09 DIAGNOSIS — D649 Anemia, unspecified: Secondary | ICD-10-CM | POA: Diagnosis not present

## 2014-12-09 DIAGNOSIS — Z90710 Acquired absence of both cervix and uterus: Secondary | ICD-10-CM | POA: Diagnosis present

## 2014-12-09 DIAGNOSIS — Z85850 Personal history of malignant neoplasm of thyroid: Secondary | ICD-10-CM | POA: Insufficient documentation

## 2014-12-09 HISTORY — PX: SALPINGOOPHORECTOMY: SHX82

## 2014-12-09 HISTORY — PX: LAPAROSCOPIC ASSISTED VAGINAL HYSTERECTOMY: SHX5398

## 2014-12-09 LAB — PREGNANCY, URINE: Preg Test, Ur: NEGATIVE

## 2014-12-09 LAB — GLUCOSE, CAPILLARY
GLUCOSE-CAPILLARY: 124 mg/dL — AB (ref 65–99)
GLUCOSE-CAPILLARY: 145 mg/dL — AB (ref 65–99)

## 2014-12-09 SURGERY — HYSTERECTOMY, VAGINAL, LAPAROSCOPY-ASSISTED
Anesthesia: General | Site: Abdomen

## 2014-12-09 MED ORDER — KETOROLAC TROMETHAMINE 30 MG/ML IJ SOLN: 30.0000 mg | Freq: Four times a day (QID) | INTRAMUSCULAR | Status: DC

## 2014-12-09 MED ORDER — ALUM & MAG HYDROXIDE-SIMETH 200-200-20 MG/5ML PO SUSP: 30.0000 mL | ORAL | Status: DC | PRN

## 2014-12-09 MED ORDER — PHENYLEPHRINE HCL 10 MG/ML IJ SOLN
INTRAMUSCULAR | Status: DC | PRN
  Administered 2014-12-09 (×4): 80 ug via INTRAVENOUS
  Administered 2014-12-09 (×4): 40 ug via INTRAVENOUS
  Administered 2014-12-09: 80 ug via INTRAVENOUS
  Administered 2014-12-09: 40 ug via INTRAVENOUS
  Administered 2014-12-09 (×2): 80 ug via INTRAVENOUS
  Administered 2014-12-09: 40 ug via INTRAVENOUS
  Administered 2014-12-09: 80 ug via INTRAVENOUS

## 2014-12-09 MED ORDER — METFORMIN HCL 500 MG PO TABS
1000.0000 mg | ORAL_TABLET | Freq: Two times a day (BID) | ORAL | Status: DC
  Administered 2014-12-10: 1000 mg via ORAL
  Filled 2014-12-09 (×3): qty 2

## 2014-12-09 MED ORDER — SODIUM CHLORIDE 0.9 % IJ SOLN
INTRAMUSCULAR | Status: AC
  Filled 2014-12-09: qty 50

## 2014-12-09 MED ORDER — NEOSTIGMINE METHYLSULFATE 10 MG/10ML IV SOLN
INTRAVENOUS | Status: AC
  Filled 2014-12-09: qty 1

## 2014-12-09 MED ORDER — GLYCOPYRROLATE 0.2 MG/ML IJ SOLN
INTRAMUSCULAR | Status: DC | PRN
  Administered 2014-12-09: 0.6 mg via INTRAVENOUS

## 2014-12-09 MED ORDER — BUPIVACAINE HCL (PF) 0.25 % IJ SOLN
INTRAMUSCULAR | Status: DC | PRN
  Administered 2014-12-09: 10 mL

## 2014-12-09 MED ORDER — CEFAZOLIN SODIUM-DEXTROSE 2-3 GM-% IV SOLR
INTRAVENOUS | Status: AC
  Filled 2014-12-09: qty 50

## 2014-12-09 MED ORDER — DEXTROSE-NACL 5-0.45 % IV SOLN
INTRAVENOUS | Status: DC
Start: 2014-12-09 — End: 2014-12-10

## 2014-12-09 MED ORDER — VASOPRESSIN 20 UNIT/ML IV SOLN
INTRAVENOUS | Status: DC | PRN
  Administered 2014-12-09: 10 mL via INTRAMUSCULAR

## 2014-12-09 MED ORDER — PANTOPRAZOLE SODIUM 40 MG PO TBEC
40.0000 mg | DELAYED_RELEASE_TABLET | Freq: Every day | ORAL | Status: DC
  Administered 2014-12-10: 40 mg via ORAL
  Filled 2014-12-09: qty 1

## 2014-12-09 MED ORDER — LACTATED RINGERS IV SOLN
INTRAVENOUS | Status: DC
  Administered 2014-12-09 (×4): via INTRAVENOUS

## 2014-12-09 MED ORDER — ONDANSETRON HCL 4 MG/2ML IJ SOLN
4.0000 mg | Freq: Once | INTRAMUSCULAR | Status: DC | PRN
Start: 2014-12-09 — End: 2014-12-09

## 2014-12-09 MED ORDER — ONDANSETRON HCL 4 MG PO TABS: 4.0000 mg | ORAL_TABLET | Freq: Four times a day (QID) | ORAL | Status: DC | PRN

## 2014-12-09 MED ORDER — GABAPENTIN 300 MG PO CAPS: 300.0000 mg | ORAL_CAPSULE | Freq: Two times a day (BID) | ORAL | Status: DC

## 2014-12-09 MED ORDER — FENTANYL CITRATE (PF) 100 MCG/2ML IJ SOLN
INTRAMUSCULAR | Status: AC
  Filled 2014-12-09: qty 2

## 2014-12-09 MED ORDER — KETOROLAC TROMETHAMINE 30 MG/ML IJ SOLN: 30.0000 mg | Freq: Once | INTRAMUSCULAR | Status: DC

## 2014-12-09 MED ORDER — KETOROLAC TROMETHAMINE 30 MG/ML IJ SOLN
INTRAMUSCULAR | Status: DC | PRN
  Administered 2014-12-09: 30 mg via INTRAVENOUS

## 2014-12-09 MED ORDER — ONDANSETRON HCL 4 MG/2ML IJ SOLN
INTRAMUSCULAR | Status: AC
  Filled 2014-12-09: qty 2

## 2014-12-09 MED ORDER — FENTANYL CITRATE (PF) 250 MCG/5ML IJ SOLN
INTRAMUSCULAR | Status: AC
  Filled 2014-12-09: qty 5

## 2014-12-09 MED ORDER — CHLORTHALIDONE 25 MG PO TABS
25.0000 mg | ORAL_TABLET | Freq: Every day | ORAL | Status: DC
  Administered 2014-12-10: 25 mg via ORAL
  Filled 2014-12-09 (×2): qty 1

## 2014-12-09 MED ORDER — EPHEDRINE SULFATE 50 MG/ML IJ SOLN
INTRAMUSCULAR | Status: DC | PRN
  Administered 2014-12-09 (×3): 5 mg via INTRAVENOUS

## 2014-12-09 MED ORDER — MIDAZOLAM HCL 2 MG/2ML IJ SOLN
INTRAMUSCULAR | Status: AC
  Filled 2014-12-09: qty 2

## 2014-12-09 MED ORDER — TAMOXIFEN CITRATE 10 MG PO TABS
20.0000 mg | ORAL_TABLET | Freq: Every day | ORAL | Status: DC
  Administered 2014-12-10: 20 mg via ORAL
  Filled 2014-12-09 (×3): qty 2

## 2014-12-09 MED ORDER — LACTATED RINGERS IR SOLN
Status: DC | PRN
  Administered 2014-12-09: 3000 mL

## 2014-12-09 MED ORDER — ROCURONIUM BROMIDE 100 MG/10ML IV SOLN
INTRAVENOUS | Status: DC | PRN
  Administered 2014-12-09: 50 mg via INTRAVENOUS

## 2014-12-09 MED ORDER — SIMETHICONE 80 MG PO CHEW: 80.0000 mg | CHEWABLE_TABLET | Freq: Four times a day (QID) | ORAL | Status: DC | PRN

## 2014-12-09 MED ORDER — ATENOLOL 50 MG PO TABS
50.0000 mg | ORAL_TABLET | Freq: Every day | ORAL | Status: DC
  Administered 2014-12-10: 50 mg via ORAL
  Filled 2014-12-09 (×2): qty 1

## 2014-12-09 MED ORDER — BUPIVACAINE HCL (PF) 0.5 % IJ SOLN
INTRAMUSCULAR | Status: DC | PRN
  Administered 2014-12-09: 10 mL

## 2014-12-09 MED ORDER — OXYCODONE-ACETAMINOPHEN 5-325 MG PO TABS
1.0000 | ORAL_TABLET | ORAL | Status: DC | PRN
  Administered 2014-12-09 – 2014-12-10 (×3): 2 via ORAL
  Filled 2014-12-09 (×2): qty 1
  Filled 2014-12-09 (×2): qty 2

## 2014-12-09 MED ORDER — GABAPENTIN 300 MG PO CAPS
600.0000 mg | ORAL_CAPSULE | Freq: Every day | ORAL | Status: DC
  Administered 2014-12-09: 600 mg via ORAL
  Filled 2014-12-09 (×2): qty 2

## 2014-12-09 MED ORDER — MIDAZOLAM HCL 2 MG/2ML IJ SOLN
INTRAMUSCULAR | Status: DC | PRN
  Administered 2014-12-09: 2 mg via INTRAVENOUS

## 2014-12-09 MED ORDER — SCOPOLAMINE 1 MG/3DAYS TD PT72
1.0000 | MEDICATED_PATCH | Freq: Once | TRANSDERMAL | Status: DC
  Administered 2014-12-09: 1.5 mg via TRANSDERMAL

## 2014-12-09 MED ORDER — ONDANSETRON HCL 4 MG/2ML IJ SOLN: 4.0000 mg | Freq: Four times a day (QID) | INTRAMUSCULAR | Status: DC | PRN

## 2014-12-09 MED ORDER — VASOPRESSIN 20 UNIT/ML IV SOLN
INTRAVENOUS | Status: AC
  Filled 2014-12-09: qty 1

## 2014-12-09 MED ORDER — BUPIVACAINE HCL (PF) 0.25 % IJ SOLN
INTRAMUSCULAR | Status: AC
  Filled 2014-12-09: qty 30

## 2014-12-09 MED ORDER — HYDROMORPHONE HCL 1 MG/ML IJ SOLN
1.0000 mg | INTRAMUSCULAR | Status: DC | PRN
Start: 2014-12-09 — End: 2014-12-10
  Administered 2014-12-09: 1 mg via INTRAVENOUS
  Filled 2014-12-09: qty 1

## 2014-12-09 MED ORDER — PROPOFOL 10 MG/ML IV BOLUS
INTRAVENOUS | Status: DC | PRN
  Administered 2014-12-09: 150 mg via INTRAVENOUS

## 2014-12-09 MED ORDER — MENTHOL 3 MG MT LOZG
1.0000 | LOZENGE | OROMUCOSAL | Status: DC | PRN
  Administered 2014-12-10: 3 mg via ORAL
  Filled 2014-12-09: qty 9

## 2014-12-09 MED ORDER — POTASSIUM CHLORIDE CRYS ER 20 MEQ PO TBCR
20.0000 meq | EXTENDED_RELEASE_TABLET | Freq: Every day | ORAL | Status: DC
  Administered 2014-12-10: 20 meq via ORAL
  Filled 2014-12-09 (×3): qty 1

## 2014-12-09 MED ORDER — KETOROLAC TROMETHAMINE 30 MG/ML IJ SOLN
INTRAMUSCULAR | Status: AC
  Filled 2014-12-09: qty 1

## 2014-12-09 MED ORDER — FENTANYL CITRATE (PF) 100 MCG/2ML IJ SOLN
INTRAMUSCULAR | Status: DC | PRN
  Administered 2014-12-09: 50 ug via INTRAVENOUS
  Administered 2014-12-09 (×2): 100 ug via INTRAVENOUS

## 2014-12-09 MED ORDER — ROCURONIUM BROMIDE 100 MG/10ML IV SOLN
INTRAVENOUS | Status: AC
  Filled 2014-12-09: qty 1

## 2014-12-09 MED ORDER — NEOSTIGMINE METHYLSULFATE 10 MG/10ML IV SOLN
INTRAVENOUS | Status: DC | PRN
  Administered 2014-12-09: 4 mg via INTRAVENOUS

## 2014-12-09 MED ORDER — KETOROLAC TROMETHAMINE 30 MG/ML IJ SOLN
30.0000 mg | Freq: Four times a day (QID) | INTRAMUSCULAR | Status: DC
  Administered 2014-12-09 – 2014-12-10 (×3): 30 mg via INTRAVENOUS
  Filled 2014-12-09 (×3): qty 1

## 2014-12-09 MED ORDER — LIDOCAINE HCL (CARDIAC) 20 MG/ML IV SOLN
INTRAVENOUS | Status: AC
  Filled 2014-12-09: qty 5

## 2014-12-09 MED ORDER — BUPIVACAINE HCL (PF) 0.5 % IJ SOLN
INTRAMUSCULAR | Status: AC
  Filled 2014-12-09: qty 30

## 2014-12-09 MED ORDER — GABAPENTIN 300 MG PO CAPS
300.0000 mg | ORAL_CAPSULE | Freq: Every day | ORAL | Status: DC
  Administered 2014-12-10: 300 mg via ORAL
  Filled 2014-12-09 (×4): qty 1

## 2014-12-09 MED ORDER — FENTANYL CITRATE (PF) 100 MCG/2ML IJ SOLN
25.0000 ug | INTRAMUSCULAR | Status: DC | PRN
  Administered 2014-12-09: 50 ug via INTRAVENOUS

## 2014-12-09 MED ORDER — ALBUTEROL SULFATE (2.5 MG/3ML) 0.083% IN NEBU: 3.0000 mL | INHALATION_SOLUTION | Freq: Four times a day (QID) | RESPIRATORY_TRACT | Status: DC | PRN

## 2014-12-09 MED ORDER — SODIUM CHLORIDE 0.9 % IJ SOLN
INTRAMUSCULAR | Status: DC | PRN
  Administered 2014-12-09: 10 mL

## 2014-12-09 MED ORDER — ONDANSETRON HCL 4 MG/2ML IJ SOLN
INTRAMUSCULAR | Status: DC | PRN
  Administered 2014-12-09: 4 mg via INTRAVENOUS

## 2014-12-09 MED ORDER — IBUPROFEN 600 MG PO TABS
600.0000 mg | ORAL_TABLET | Freq: Four times a day (QID) | ORAL | Status: DC | PRN
  Administered 2014-12-10: 600 mg via ORAL
  Filled 2014-12-09: qty 1

## 2014-12-09 MED ORDER — PROPOFOL 10 MG/ML IV BOLUS
INTRAVENOUS | Status: AC
  Filled 2014-12-09: qty 20

## 2014-12-09 MED ORDER — GLYCOPYRROLATE 0.2 MG/ML IJ SOLN
INTRAMUSCULAR | Status: AC
  Filled 2014-12-09: qty 3

## 2014-12-09 MED ORDER — PHENYLEPHRINE 40 MCG/ML (10ML) SYRINGE FOR IV PUSH (FOR BLOOD PRESSURE SUPPORT)
PREFILLED_SYRINGE | INTRAVENOUS | Status: AC
  Filled 2014-12-09: qty 30

## 2014-12-09 MED ORDER — ATENOLOL-CHLORTHALIDONE 50-25 MG PO TABS: 1.0000 | ORAL_TABLET | Freq: Every day | ORAL | Status: DC

## 2014-12-09 MED ORDER — CEFAZOLIN SODIUM-DEXTROSE 2-3 GM-% IV SOLR
2.0000 g | INTRAVENOUS | Status: AC
  Administered 2014-12-09: 2 g via INTRAVENOUS

## 2014-12-09 MED ORDER — SCOPOLAMINE 1 MG/3DAYS TD PT72
MEDICATED_PATCH | TRANSDERMAL | Status: AC
  Filled 2014-12-09: qty 1

## 2014-12-09 SURGICAL SUPPLY — 39 items
CANISTER SUCT 3000ML (MISCELLANEOUS) ×5 IMPLANT
CATH ROBINSON RED A/P 16FR (CATHETERS) ×5 IMPLANT
CLOTH BEACON ORANGE TIMEOUT ST (SAFETY) ×5 IMPLANT
CONT PATH 16OZ SNAP LID 3702 (MISCELLANEOUS) ×5 IMPLANT
COVER BACK TABLE 60X90IN (DRAPES) ×5 IMPLANT
DECANTER SPIKE VIAL GLASS SM (MISCELLANEOUS) ×15 IMPLANT
DRSG COVADERM PLUS 2X2 (GAUZE/BANDAGES/DRESSINGS) ×10 IMPLANT
DRSG OPSITE POSTOP 3X4 (GAUZE/BANDAGES/DRESSINGS) IMPLANT
DURAPREP 26ML APPLICATOR (WOUND CARE) ×5 IMPLANT
ELECT REM PT RETURN 9FT ADLT (ELECTROSURGICAL) ×5
ELECTRODE REM PT RTRN 9FT ADLT (ELECTROSURGICAL) ×1 IMPLANT
GLOVE BIO SURGEON STRL SZ8 (GLOVE) ×5 IMPLANT
GLOVE BIOGEL PI IND STRL 6.5 (GLOVE) ×3 IMPLANT
GLOVE BIOGEL PI IND STRL 7.0 (GLOVE) ×6 IMPLANT
GLOVE BIOGEL PI INDICATOR 6.5 (GLOVE) ×2
GLOVE BIOGEL PI INDICATOR 7.0 (GLOVE) ×4
GLOVE ORTHO TXT STRL SZ7.5 (GLOVE) ×10 IMPLANT
GOWN STRL REUS W/TWL LRG LVL3 (GOWN DISPOSABLE) ×10 IMPLANT
LIQUID BAND (GAUZE/BANDAGES/DRESSINGS) ×5 IMPLANT
NEEDLE INSUFFLATION 120MM (ENDOMECHANICALS) ×5 IMPLANT
NS IRRIG 1000ML POUR BTL (IV SOLUTION) ×5 IMPLANT
PACK LAVH (CUSTOM PROCEDURE TRAY) ×5 IMPLANT
PACK ROBOTIC GOWN (GOWN DISPOSABLE) ×5 IMPLANT
PAD POSITIONER PINK NONSTERILE (MISCELLANEOUS) ×5 IMPLANT
SET CYSTO W/LG BORE CLAMP LF (SET/KITS/TRAYS/PACK) ×5 IMPLANT
SET IRRIG TUBING LAPAROSCOPIC (IRRIGATION / IRRIGATOR) ×5 IMPLANT
SHEARS FOC LG CVD HARMONIC 17C (MISCELLANEOUS) ×5 IMPLANT
SHEARS HARMONIC ACE PLUS 36CM (ENDOMECHANICALS) ×5 IMPLANT
SUT CHROMIC 1MO 4 18 CR8 (SUTURE) ×10 IMPLANT
SUT CHROMIC GUT AB #0 18 (SUTURE) ×5 IMPLANT
SUT SILK 2 0 SH (SUTURE) ×5 IMPLANT
SUT VIC AB 2-0 CT1 (SUTURE) ×5 IMPLANT
SUT VICRYL 4-0 PS2 18IN ABS (SUTURE) ×5 IMPLANT
TOWEL OR 17X24 6PK STRL BLUE (TOWEL DISPOSABLE) ×10 IMPLANT
TRAY FOLEY CATH SILVER 14FR (SET/KITS/TRAYS/PACK) ×5 IMPLANT
TROCAR XCEL NON-BLD 5MMX100MML (ENDOMECHANICALS) ×5 IMPLANT
TROCAR XCEL OPT SLVE 5M 100M (ENDOMECHANICALS) ×10 IMPLANT
WARMER LAPAROSCOPE (MISCELLANEOUS) ×5 IMPLANT
WATER STERILE IRR 1000ML POUR (IV SOLUTION) ×5 IMPLANT

## 2014-12-09 NOTE — Transfer of Care (Signed)
Immediate Anesthesia Transfer of Care Note  Patient: Shelly Hall  Procedure(s) Performed: Procedure(s): LAPAROSCOPIC ASSISTED VAGINAL HYSTERECTOMY (N/A) SALPINGO OOPHORECTOMY (Bilateral)  Patient Location: PACU  Anesthesia Type:General  Level of Consciousness: awake, alert  and oriented  Airway & Oxygen Therapy: Patient Spontanous Breathing and Patient connected to nasal cannula oxygen  Post-op Assessment: Report given to RN and Post -op Vital signs reviewed and stable  Post vital signs: Reviewed and stable  Last Vitals:  Filed Vitals:   12/09/14 0820  BP: 136/82  Pulse: 73  Temp: 36.7 C  Resp: 18    Complications: No apparent anesthesia complications

## 2014-12-09 NOTE — Anesthesia Procedure Notes (Signed)
Procedure Name: Intubation Date/Time: 12/09/2014 11:13 AM Performed by: Chellsea Beckers, Sheron Nightingale Pre-anesthesia Checklist: Patient identified, Emergency Drugs available, Suction available, Patient being monitored and Timeout performed Patient Re-evaluated:Patient Re-evaluated prior to inductionOxygen Delivery Method: Circle system utilized Preoxygenation: Pre-oxygenation with 100% oxygen Intubation Type: IV induction Ventilation: Mask ventilation without difficulty Laryngoscope Size: Mac and 3 Grade View: Grade I Tube type: Oral Number of attempts: 1 Placement Confirmation: ETT inserted through vocal cords under direct vision and positive ETCO2 Secured at: 22 cm Dental Injury: Teeth and Oropharynx as per pre-operative assessment

## 2014-12-09 NOTE — Anesthesia Postprocedure Evaluation (Signed)
  Anesthesia Post-op Note  Patient: Shelly Hall  Procedure(s) Performed: Procedure(s) (LRB): LAPAROSCOPIC ASSISTED VAGINAL HYSTERECTOMY (N/A) SALPINGO OOPHORECTOMY (Bilateral)  Patient Location: PACU  Anesthesia Type: General  Level of Consciousness: awake and alert   Airway and Oxygen Therapy: Patient Spontanous Breathing  Post-op Pain: mild  Post-op Assessment: Post-op Vital signs reviewed, Patient's Cardiovascular Status Stable, Respiratory Function Stable, Patent Airway and No signs of Nausea or vomiting  Last Vitals:  Filed Vitals:   12/09/14 0820  BP: 136/82  Pulse: 73  Temp: 36.7 C  Resp: 18    Post-op Vital Signs: stable   Complications: No apparent anesthesia complications

## 2014-12-09 NOTE — Progress Notes (Signed)
Patient ID: Shelly Hall, female   DOB: 06-Feb-1971, 44 y.o.   MRN: [id number] DOS VS normal Output OK before catheter removed but unable to void since so may need to have catheter reinserted. She states she sat on the commode but could not void.

## 2014-12-09 NOTE — Op Note (Signed)
Preoperative diagnosis: Symptomatic myomatous uterus, breast cancer Postoperative diagnosis:  Same Procedure: Laparoscopic-assisted vaginal hysterectomy, BSO Surgeon: Cheri Fowler M.D. Assistant: Paula Compton, MD. Anesthesia: Gen. Endotracheal tube Findings: She had a normal upper abdomen. Uterus was significantly enlarged with myomas, tubes and ovaries were normal Estimated blood loss: 200 cc Specimens: Uterus, tubes and ovaries sent for routine pathology Complications: None  Procedure in detail: The patient was taken to the operating room and placed in the dorsosupine position. General anesthesia was induced and legs were placed in mobile stirrups and arms tucked to her sides. Abdomen and perineum were then prepped and draped in usual sterile fashion, bladder drained with a red Robinson catheter. Infraumbilical skin was then infiltrated with quarter percent Marcaine and a 1 cm vertical incision was made. Veress needle was inserted into the peritoneal cavity and placement confirmed by the water drop test an opening pressure of 6 mm mercury. CO2 was insufflated to a pressure of 13 mm mercury and the Veress needle was removed. A 5 mm trocar was introduced with direct visualization. A 5 mm port was then placed on the left side also under direct visualization. Inspection revealed the above-mentioned findings. A third 5 mm port was placed on the right side also under direct visualization.  The right uterine distal tube and ovary was grasped and the Harmonic Scalpel ACE was then used to take down the right infundibulopelvic ligament, followed by the right round ligament, right broad ligament and incised the anterior peritoneum across the anterior lower part of the uterus. A similar procedure was then performed on the left side taking down the IP pedicle, round ligament, broad ligament. Anterior peritoneum was incised across the anterior part of the uterus to meet the incision coming from the right side.  At this point the uterus was fairly free and there is adequate hemostasis to proceed vaginally.  The legs were elevated in stirrups. A weighted speculum was inserted in the vagina. The cervix was grasped with Ardis Hughs tenaculums. A dilute solution of Pitressin with Marcaine was infiltrated around the cervicovaginal junction which was then incised circumferentially with electrocautery. Sharp dissection was then used to further free the vagina from the cervix. Anterior peritoneum was identified and entered sharply. A Deaver retractor was then to retract the bladder anterior. Posterior cul-de-sac was identified and entered sharply. A Bonnano speculum was placed into the posterior cul-de-sac. Uterosacral ligaments were clamped transected and ligated with #1 chromic and tagged for later use. Cardinal ligaments and uterine arteries were likewise clamped transected and ligated with #1 chromic. The remaining pedicles were clamped transected and ligated and the uterus was removed. No significant bleeding from any pedicles was identified. The uterosacral ligaments were plicated in the midline with 2-0 silk and the previously tagged uterosacral pedicles were also tied in the midline. The vaginal cuff was then closed in a vertical fashion with running locking 2-0 Vicryl with adequate closure and adequate hemostasis. The bladder was then drained with a Foley catheter which was then removed.  Attention was turned back to the abdomen. The scrub tech and myself all changed gowns and gloves. The abdomen was reinsufflated. Laparoscope was reinserted and good visualization was achieved. No significant bleeding was identified. Both ureters were identified and found to be below incision lines. The 5 mm ports on the each side were removed under direct visualization. The laparoscope was then removed and all gas was allowed to deflate from the abdomen. The umbilical trocar was then removed. Skin incisions were closed with interrupted  subcuticular sutures of 4-0 Vicryl followed by Dermabond. The patient was awakened in the operating room and taken to the recovery room in stable condition after tolerating the procedure well. Counts were correct x2, she received Ancef 2 g IV the beginning of the procedure, she had PAS hose on throughout the procedure.

## 2014-12-09 NOTE — Interval H&P Note (Signed)
History and Physical Interval Note:  12/09/2014 10:26 AM  Shelly Hall  has presented today for surgery, with the diagnosis of Myomas and Breast Cancer  The various methods of treatment have been discussed with the patient and family. After consideration of risks, benefits and other options for treatment, the patient has consented to  Procedure(s): LAPAROSCOPIC ASSISTED VAGINAL HYSTERECTOMY (N/A) SALPINGO OOPHORECTOMY (Bilateral) CYSTOSCOPY, possible (N/A) as a surgical intervention .  The patient's history has been reviewed, patient examined, no change in status, stable for surgery.  I have reviewed the patient's chart and labs.  Questions were answered to the patient's satisfaction.     [name] D

## 2014-12-09 NOTE — Addendum Note (Signed)
Addendum  created 12/09/14 1555 by Alvy Bimler, CRNA   Modules edited: Notes Section   Notes Section:  File: [id number]

## 2014-12-09 NOTE — Anesthesia Postprocedure Evaluation (Signed)
  Anesthesia Post-op Note  Patient: Shelly Hall  Procedure(s) Performed: Procedure(s): LAPAROSCOPIC ASSISTED VAGINAL HYSTERECTOMY (N/A) SALPINGO OOPHORECTOMY (Bilateral)  Patient Location: PAC[facility]  Anesthesia Type:General  Level of Consciousness: awake, alert , oriented and patient cooperative  Airway and Oxygen Therapy: Patient Spontanous Breathing and Patient connected to nasal cannula oxygen  Post-op Pain: none  Post-op Assessment: Post-op Vital signs reviewed, Patient's Cardiovascular Status Stable, Respiratory Function Stable, Patent Airway, No signs of Nausea or vomiting, Adequate PO intake and Pain level controlled              Post-op Vital Signs: Reviewed and stable  Last Vitals:  Filed Vitals:   12/09/14 1551  BP: 113/70  Pulse: 58  Temp: 36.7 C  Resp: 16    Complications: No apparent anesthesia complications

## 2014-12-10 ENCOUNTER — Encounter (HOSPITAL_COMMUNITY): Payer: Self-pay | Admitting: Obstetrics and Gynecology

## 2014-12-10 DIAGNOSIS — D259 Leiomyoma of uterus, unspecified: Secondary | ICD-10-CM | POA: Diagnosis not present

## 2014-12-10 LAB — CBC
HCT: 26.2 % — ABNORMAL LOW (ref 36.0–46.0)
Hemoglobin: 8.4 g/dL — ABNORMAL LOW (ref 12.0–15.0)
MCH: 29.8 pg (ref 26.0–34.0)
MCHC: 32.1 g/dL (ref 30.0–36.0)
MCV: 92.9 fL (ref 78.0–100.0)
PLATELETS: 169 10*3/uL (ref 150–400)
RBC: 2.82 MIL/uL — ABNORMAL LOW (ref 3.87–5.11)
RDW: 14.3 % (ref 11.5–15.5)
WBC: 6.5 10*3/uL (ref 4.0–10.5)

## 2014-12-10 MED ORDER — OXYCODONE-ACETAMINOPHEN 5-325 MG PO TABS: 1.0000 | ORAL_TABLET | ORAL | Status: DC | PRN

## 2014-12-10 MED ORDER — IBUPROFEN 600 MG PO TABS: 600.0000 mg | ORAL_TABLET | Freq: Four times a day (QID) | ORAL | Status: DC | PRN

## 2014-12-10 NOTE — Discharge Instructions (Signed)
Routine instructions for hysterectomy °

## 2014-12-10 NOTE — Progress Notes (Signed)
1 Day Post-Op Procedure(s) (LRB): LAPAROSCOPIC ASSISTED VAGINAL HYSTERECTOMY (N/A) SALPINGO OOPHORECTOMY (Bilateral)  Subjective: Patient reports incisional pain and tolerating PO.    Objective: I have reviewed patient's vital signs, intake and output and labs.  General: alert GI: soft, incisions intact Vaginal Bleeding: minimal  Assessment: s/p Procedure(s): LAPAROSCOPIC ASSISTED VAGINAL HYSTERECTOMY (N/A) SALPINGO OOPHORECTOMY (Bilateral): stable and progressing well  Plan: Advance diet Encourage ambulation Discharge home     Shelly Hall D 12/10/2014, 7:59 AM

## 2014-12-10 NOTE — Discharge Summary (Signed)
Physician Discharge Summary  Patient ID: Shelly Hall MRN: [id number] DOB/AGE: [birth year] 46 y.o.  Admit date: 12/09/2014 Discharge date: 12/10/2014  Admission Diagnoses:  Symptomatic myomatous uterus, breast cancer  Discharge Diagnoses:  Same Active Problems:   S/P hysterectomy   Discharged Condition: good  Hospital Course: Underwent LAVH/BSO without difficulty, some vaginal morcellation to remove uterus.  No post-op complications.  Discharge Exam: Blood pressure 104/61, pulse 57, temperature 98.8 F (37.1 C), temperature source Oral, resp. rate 20, height 5\' 3"  (1.6 m), weight 83.462 kg (184 lb), last menstrual period 12/07/2014, SpO2 100 %. General appearance: alert  Disposition: 01-Home or Self Care  Discharge Instructions    Diet - low sodium heart healthy    Complete by:  As directed      Increase activity slowly    Complete by:  As directed      Lifting restrictions    Complete by:  As directed   10 lbs     Sexual Activity Restrictions    Complete by:  As directed   Pelvic rest            Medication List    STOP taking these medications        HYDROcodone-acetaminophen 5-325 MG per tablet  Commonly known as:  NORCO/VICODIN      TAKE these medications        albuterol 108 (90 BASE) MCG/ACT inhaler  Commonly known as:  PROVENTIL HFA;VENTOLIN HFA  Inhale 2 puffs into the lungs every 6 (six) hours as needed for wheezing or shortness of breath.     atenolol-chlorthalidone 50-25 MG per tablet  Commonly known as:  TENORETIC  Take 1 tablet by mouth daily.     docusate sodium 100 MG capsule  Commonly known as:  COLACE  Take 300 mg by mouth daily.     ferrous sulfate 325 (65 FE) MG tablet  Take 325 mg by mouth daily with breakfast.     gabapentin 300 MG capsule  Commonly known as:  NEURONTIN  Take 1 capsule in am and 2 capsules at bedtime.     gemfibrozil 600 MG tablet  Commonly known as:  LOPID  Take 600 mg by mouth 2 (two) times daily before a  meal.     ibuprofen 600 MG tablet  Commonly known as:  ADVIL,MOTRIN  Take 1 tablet (600 mg total) by mouth every 6 (six) hours as needed (mild pain).     metFORMIN 1000 MG tablet  Commonly known as:  GLUCOPHAGE  Take 1,000 mg by mouth 2 (two) times daily with a meal.     multivitamin with minerals Tabs tablet  Take 1 tablet by mouth daily.     oxyCODONE-acetaminophen 5-325 MG per tablet  Commonly known as:  PERCOCET/ROXICET  Take 1-2 tablets by mouth every 4 (four) hours as needed for severe pain (moderate to severe pain (when tolerating fluids)).     pantoprazole 40 MG tablet  Commonly known as:  PROTONIX  Take 1 tablet (40 mg total) by mouth daily.     potassium chloride SA 20 MEQ tablet  Commonly known as:  K-DUR,KLOR-CON  Take 20 mEq by mouth daily.     tamoxifen 20 MG tablet  Commonly known as:  NOLVADEX  Take 1 tablet (20 mg total) by mouth daily.           Follow-up Information    Follow up with Athira Janowicz D, MD. Schedule an appointment as soon as possible for a visit in  2 weeks.   Specialty:  Obstetrics and Gynecology   Contact information:   [address] [place] [address] [phone]       Signed: Clarene Duke 12/10/2014, 8:03 AM

## 2015-01-14 ENCOUNTER — Telehealth: Payer: Self-pay | Admitting: Oncology

## 2015-01-14 NOTE — Telephone Encounter (Signed)
Returned Advertising account executive. Patient reschedule missed appointment from today 08/11 to 08/12. Patient confirmed

## 2015-01-20 ENCOUNTER — Ambulatory Visit (HOSPITAL_BASED_OUTPATIENT_CLINIC_OR_DEPARTMENT_OTHER): Payer: Medicaid Other

## 2015-01-20 DIAGNOSIS — C50919 Malignant neoplasm of unspecified site of unspecified female breast: Secondary | ICD-10-CM

## 2015-01-20 DIAGNOSIS — Z452 Encounter for adjustment and management of vascular access device: Secondary | ICD-10-CM | POA: Diagnosis not present

## 2015-01-20 DIAGNOSIS — C50412 Malignant neoplasm of upper-outer quadrant of left female breast: Secondary | ICD-10-CM

## 2015-01-20 MED ORDER — SODIUM CHLORIDE 0.9 % IJ SOLN
10.0000 mL | INTRAMUSCULAR | Status: DC | PRN
  Administered 2015-01-20: 10 mL via INTRAVENOUS
  Filled 2015-01-20: qty 10

## 2015-01-20 MED ORDER — HEPARIN SOD (PORK) LOCK FLUSH 100 UNIT/ML IV SOLN
500.0000 [IU] | Freq: Once | INTRAVENOUS | Status: AC
  Administered 2015-01-20: 500 [IU] via INTRAVENOUS
  Filled 2015-01-20: qty 5

## 2015-01-20 NOTE — Patient Instructions (Signed)

## 2015-01-27 ENCOUNTER — Ambulatory Visit: Payer: Medicaid Other | Attending: General Surgery | Admitting: Physical Therapy

## 2015-02-01 ENCOUNTER — Encounter (HOSPITAL_BASED_OUTPATIENT_CLINIC_OR_DEPARTMENT_OTHER): Payer: Self-pay | Admitting: *Deleted

## 2015-02-02 ENCOUNTER — Encounter (HOSPITAL_BASED_OUTPATIENT_CLINIC_OR_DEPARTMENT_OTHER)
Admission: RE | Admit: 2015-02-02 | Discharge: 2015-02-02 | Disposition: A | Discharge status: Home / Self Care | Payer: Medicaid Other | Source: Ambulatory Visit | Attending: General Surgery | Admitting: General Surgery

## 2015-02-02 DIAGNOSIS — Z452 Encounter for adjustment and management of vascular access device: Secondary | ICD-10-CM | POA: Diagnosis not present

## 2015-02-02 DIAGNOSIS — F418 Other specified anxiety disorders: Secondary | ICD-10-CM | POA: Diagnosis not present

## 2015-02-02 DIAGNOSIS — E119 Type 2 diabetes mellitus without complications: Secondary | ICD-10-CM | POA: Diagnosis not present

## 2015-02-02 DIAGNOSIS — I10 Essential (primary) hypertension: Secondary | ICD-10-CM | POA: Diagnosis not present

## 2015-02-02 DIAGNOSIS — Z79899 Other long term (current) drug therapy: Secondary | ICD-10-CM | POA: Diagnosis not present

## 2015-02-02 DIAGNOSIS — K219 Gastro-esophageal reflux disease without esophagitis: Secondary | ICD-10-CM | POA: Diagnosis not present

## 2015-02-02 DIAGNOSIS — F17200 Nicotine dependence, unspecified, uncomplicated: Secondary | ICD-10-CM | POA: Diagnosis not present

## 2015-02-02 DIAGNOSIS — C50412 Malignant neoplasm of upper-outer quadrant of left female breast: Secondary | ICD-10-CM | POA: Diagnosis not present

## 2015-02-02 LAB — POCT I-STAT, CHEM 8
BUN: 21 mg/dL — AB (ref 6–20)
CREATININE: 1 mg/dL (ref 0.44–1.00)
Calcium, Ion: 1.25 mmol/L — ABNORMAL HIGH (ref 1.12–1.23)
Chloride: 104 mmol/L (ref 101–111)
GLUCOSE: 155 mg/dL — AB (ref 65–99)
HCT: 40 % (ref 36.0–46.0)
HEMOGLOBIN: 13.6 g/dL (ref 12.0–15.0)
Potassium: 4.3 mmol/L (ref 3.5–5.1)
Sodium: 140 mmol/L (ref 135–145)
TCO2: 25 mmol/L (ref 0–100)

## 2015-02-03 ENCOUNTER — Encounter (HOSPITAL_BASED_OUTPATIENT_CLINIC_OR_DEPARTMENT_OTHER): Payer: Self-pay | Admitting: *Deleted

## 2015-02-03 ENCOUNTER — Ambulatory Visit (HOSPITAL_BASED_OUTPATIENT_CLINIC_OR_DEPARTMENT_OTHER)
Admission: RE | Admit: 2015-02-03 | Discharge: 2015-02-03 | Disposition: A | Discharge status: Home / Self Care | Payer: Medicaid Other | Source: Ambulatory Visit | Attending: General Surgery | Admitting: General Surgery

## 2015-02-03 ENCOUNTER — Ambulatory Visit (HOSPITAL_BASED_OUTPATIENT_CLINIC_OR_DEPARTMENT_OTHER): Payer: Medicaid Other | Admitting: Anesthesiology

## 2015-02-03 ENCOUNTER — Encounter (HOSPITAL_BASED_OUTPATIENT_CLINIC_OR_DEPARTMENT_OTHER)
Admission: RE | Disposition: A | Discharge status: Home / Self Care | Payer: Self-pay | Source: Ambulatory Visit | Attending: General Surgery

## 2015-02-03 DIAGNOSIS — C50412 Malignant neoplasm of upper-outer quadrant of left female breast: Secondary | ICD-10-CM | POA: Diagnosis not present

## 2015-02-03 DIAGNOSIS — Z79899 Other long term (current) drug therapy: Secondary | ICD-10-CM | POA: Insufficient documentation

## 2015-02-03 DIAGNOSIS — F17200 Nicotine dependence, unspecified, uncomplicated: Secondary | ICD-10-CM | POA: Diagnosis not present

## 2015-02-03 DIAGNOSIS — K219 Gastro-esophageal reflux disease without esophagitis: Secondary | ICD-10-CM | POA: Insufficient documentation

## 2015-02-03 DIAGNOSIS — F418 Other specified anxiety disorders: Secondary | ICD-10-CM | POA: Insufficient documentation

## 2015-02-03 DIAGNOSIS — E119 Type 2 diabetes mellitus without complications: Secondary | ICD-10-CM | POA: Insufficient documentation

## 2015-02-03 DIAGNOSIS — Z452 Encounter for adjustment and management of vascular access device: Secondary | ICD-10-CM | POA: Insufficient documentation

## 2015-02-03 DIAGNOSIS — I10 Essential (primary) hypertension: Secondary | ICD-10-CM | POA: Insufficient documentation

## 2015-02-03 HISTORY — PX: PORT-A-CATH REMOVAL: SHX5289

## 2015-02-03 LAB — POCT I-STAT, CHEM 8
BUN: 18 mg/dL (ref 6–20)
CALCIUM ION: 1.14 mmol/L (ref 1.12–1.23)
CHLORIDE: 106 mmol/L (ref 101–111)
Creatinine, Ser: 0.9 mg/dL (ref 0.44–1.00)
Glucose, Bld: 121 mg/dL — ABNORMAL HIGH (ref 65–99)
HCT: 36 % (ref 36.0–46.0)
Hemoglobin: 12.2 g/dL (ref 12.0–15.0)
POTASSIUM: 3.9 mmol/L (ref 3.5–5.1)
SODIUM: 141 mmol/L (ref 135–145)
TCO2: 20 mmol/L (ref 0–100)

## 2015-02-03 LAB — GLUCOSE, CAPILLARY: GLUCOSE-CAPILLARY: 106 mg/dL — AB (ref 65–99)

## 2015-02-03 SURGERY — REMOVAL PORT-A-CATH
Anesthesia: Monitor Anesthesia Care | Site: Chest | Laterality: Left

## 2015-02-03 MED ORDER — METHYLENE BLUE 1 % INJ SOLN
INTRAMUSCULAR | Status: AC
  Filled 2015-02-03: qty 10

## 2015-02-03 MED ORDER — MEPERIDINE HCL 25 MG/ML IJ SOLN: 6.2500 mg | INTRAMUSCULAR | Status: DC | PRN

## 2015-02-03 MED ORDER — GLYCOPYRROLATE 0.2 MG/ML IJ SOLN: 0.2000 mg | Freq: Once | INTRAMUSCULAR | Status: DC | PRN

## 2015-02-03 MED ORDER — OXYCODONE-ACETAMINOPHEN 5-325 MG PO TABS: 1.0000 | ORAL_TABLET | ORAL | Status: DC | PRN

## 2015-02-03 MED ORDER — HYDROMORPHONE HCL 1 MG/ML IJ SOLN
0.2500 mg | INTRAMUSCULAR | Status: DC | PRN
  Administered 2015-02-03: 0.5 mg via INTRAVENOUS

## 2015-02-03 MED ORDER — SCOPOLAMINE 1 MG/3DAYS TD PT72: 1.0000 | MEDICATED_PATCH | Freq: Once | TRANSDERMAL | Status: DC | PRN

## 2015-02-03 MED ORDER — ONDANSETRON HCL 4 MG/2ML IJ SOLN
INTRAMUSCULAR | Status: AC
  Filled 2015-02-03: qty 2

## 2015-02-03 MED ORDER — PROPOFOL 10 MG/ML IV BOLUS
INTRAVENOUS | Status: AC
  Filled 2015-02-03: qty 20

## 2015-02-03 MED ORDER — PROPOFOL 500 MG/50ML IV EMUL
INTRAVENOUS | Status: DC | PRN
  Administered 2015-02-03: 100 ug/kg/min via INTRAVENOUS

## 2015-02-03 MED ORDER — MIDAZOLAM HCL 2 MG/2ML IJ SOLN
INTRAMUSCULAR | Status: AC
  Filled 2015-02-03: qty 4

## 2015-02-03 MED ORDER — LIDOCAINE-EPINEPHRINE (PF) 1 %-1:200000 IJ SOLN
INTRAMUSCULAR | Status: DC | PRN
  Administered 2015-02-03: 30 mL

## 2015-02-03 MED ORDER — FENTANYL CITRATE (PF) 100 MCG/2ML IJ SOLN
50.0000 ug | INTRAMUSCULAR | Status: DC | PRN
  Administered 2015-02-03: 100 ug via INTRAVENOUS

## 2015-02-03 MED ORDER — MIDAZOLAM HCL 2 MG/2ML IJ SOLN
1.0000 mg | INTRAMUSCULAR | Status: DC | PRN
  Administered 2015-02-03: 2 mg via INTRAVENOUS

## 2015-02-03 MED ORDER — HYDROMORPHONE HCL 1 MG/ML IJ SOLN
INTRAMUSCULAR | Status: AC
  Filled 2015-02-03: qty 1

## 2015-02-03 MED ORDER — CEFAZOLIN SODIUM-DEXTROSE 2-3 GM-% IV SOLR
INTRAVENOUS | Status: DC | PRN
  Administered 2015-02-03: 2 g via INTRAVENOUS

## 2015-02-03 MED ORDER — LACTATED RINGERS IV SOLN
INTRAVENOUS | Status: DC
  Administered 2015-02-03: 12:00:00 via INTRAVENOUS

## 2015-02-03 MED ORDER — CEFAZOLIN SODIUM-DEXTROSE 2-3 GM-% IV SOLR
INTRAVENOUS | Status: AC
  Filled 2015-02-03: qty 50

## 2015-02-03 MED ORDER — PROPOFOL 10 MG/ML IV BOLUS
INTRAVENOUS | Status: DC | PRN
  Administered 2015-02-03: 50 mg via INTRAVENOUS

## 2015-02-03 MED ORDER — BUPIVACAINE-EPINEPHRINE (PF) 0.5% -1:200000 IJ SOLN
INTRAMUSCULAR | Status: AC
  Filled 2015-02-03: qty 30

## 2015-02-03 MED ORDER — ONDANSETRON HCL 4 MG/2ML IJ SOLN
INTRAMUSCULAR | Status: DC | PRN
  Administered 2015-02-03: 4 mg via INTRAVENOUS

## 2015-02-03 MED ORDER — BUPIVACAINE HCL (PF) 0.25 % IJ SOLN
INTRAMUSCULAR | Status: DC | PRN
  Administered 2015-02-03: 30 mL

## 2015-02-03 MED ORDER — ONDANSETRON HCL 4 MG/2ML IJ SOLN: 4.0000 mg | Freq: Once | INTRAMUSCULAR | Status: DC | PRN

## 2015-02-03 MED ORDER — FENTANYL CITRATE (PF) 100 MCG/2ML IJ SOLN
INTRAMUSCULAR | Status: AC
  Filled 2015-02-03: qty 4

## 2015-02-03 MED ORDER — BUPIVACAINE HCL (PF) 0.25 % IJ SOLN
INTRAMUSCULAR | Status: AC
  Filled 2015-02-03: qty 30

## 2015-02-03 SURGICAL SUPPLY — 36 items
BLADE HEX COATED 2.75 (ELECTRODE) ×3 IMPLANT
BLADE SURG 15 STRL LF DISP TIS (BLADE) ×1 IMPLANT
BLADE SURG 15 STRL SS (BLADE) ×3
CANISTER SUCT 1200ML W/VALVE (MISCELLANEOUS) IMPLANT
CHLORAPREP W/TINT 26ML (MISCELLANEOUS) ×3 IMPLANT
COVER BACK TABLE 60X90IN (DRAPES) ×3 IMPLANT
COVER MAYO STAND STRL (DRAPES) ×3 IMPLANT
DECANTER SPIKE VIAL GLASS SM (MISCELLANEOUS) IMPLANT
DRAPE LAPAROTOMY 100X72 PEDS (DRAPES) ×3 IMPLANT
DRAPE UTILITY XL STRL (DRAPES) ×3 IMPLANT
ELECT REM PT RETURN 9FT ADLT (ELECTROSURGICAL) ×3
ELECTRODE REM PT RTRN 9FT ADLT (ELECTROSURGICAL) ×1 IMPLANT
GLOVE BIO SURGEON STRL SZ 6 (GLOVE) ×3 IMPLANT
GLOVE BIO SURGEON STRL SZ 6.5 (GLOVE) ×1 IMPLANT
GLOVE BIO SURGEONS STRL SZ 6.5 (GLOVE) ×1
GLOVE BIOGEL PI IND STRL 6.5 (GLOVE) ×1 IMPLANT
GLOVE BIOGEL PI INDICATOR 6.5 (GLOVE) ×2
GLOVE SURG SS PI 7.0 STRL IVOR (GLOVE) ×2 IMPLANT
GOWN STRL REUS W/ TWL LRG LVL3 (GOWN DISPOSABLE) ×1 IMPLANT
GOWN STRL REUS W/TWL 2XL LVL3 (GOWN DISPOSABLE) ×3 IMPLANT
GOWN STRL REUS W/TWL LRG LVL3 (GOWN DISPOSABLE) ×3
LIQUID BAND (GAUZE/BANDAGES/DRESSINGS) ×3 IMPLANT
NDL HYPO 25X1 1.5 SAFETY (NEEDLE) ×1 IMPLANT
NEEDLE HYPO 25X1 1.5 SAFETY (NEEDLE) ×3 IMPLANT
NS IRRIG 1000ML POUR BTL (IV SOLUTION) IMPLANT
PACK BASIN DAY SURGERY FS (CUSTOM PROCEDURE TRAY) ×3 IMPLANT
PENCIL BUTTON HOLSTER BLD 10FT (ELECTRODE) ×3 IMPLANT
SUT MNCRL AB 4-0 PS2 18 (SUTURE) ×3 IMPLANT
SUT VIC AB 3-0 SH 27 (SUTURE) ×3
SUT VIC AB 3-0 SH 27X BRD (SUTURE) ×1 IMPLANT
SYR CONTROL 10ML LL (SYRINGE) ×3 IMPLANT
TOWEL OR 17X24 6PK STRL BLUE (TOWEL DISPOSABLE) ×3 IMPLANT
TOWEL OR NON WOVEN STRL DISP B (DISPOSABLE) ×3 IMPLANT
TUBE CONNECTING 20'X1/4 (TUBING)
TUBE CONNECTING 20X1/4 (TUBING) IMPLANT
YANKAUER SUCT BULB TIP NO VENT (SUCTIONS) IMPLANT

## 2015-02-03 NOTE — Op Note (Signed)
  PRE-OPERATIVE DIAGNOSIS:  un-needed Port-A-Cath for left breast cancer  POST-OPERATIVE DIAGNOSIS:  Same   PROCEDURE:  Procedure(s):  REMOVAL PORT-A-CATH  SURGEON:  Surgeon(s):  [name], MD  ANESTHESIA:   MAC + local  EBL:   Minimal  SPECIMEN:  None  Complications : none known  Procedure:   Pt was  identified in the holding area and taken to the operating room where she was placed supine on the operating room table.  MAC anesthesia was induced.  The left upper chest was prepped and draped.  The prior incision was anesthetized with local anesthetic.  The incision was opened with a #15 blade and the prior scar was excised.  The subcutaneous tissue was divided with the cautery.  The port was identified and the capsule opened.  The four 2-0 prolene sutures were removed.  The port was then removed and pressure held on the tract.  The catheter appeared intact without evidence of breakage.  The wound was inspected for hemostasis, which was achieved with cautery.  The wound was closed with 3-0 vicryl deep dermal interrupted sutures and 4-0 Monocryl running subcuticular suture.  The wound was cleaned, dried, and dressed with dermabond.  The patient was awakened from anesthesia and taken to the PACU in stable condition.  Needle, sponge, and instrument counts are correct.

## 2015-02-03 NOTE — H&P (Signed)
  Shelly Hall 01/18/2015 3:45 PM Location: [facility] Patient #: [id number] DOB: [birth year] Single / Language: Shelly Hall / Race: Black or African American Female  History of Present Illness Shelly Klein MD; 01/20/2015 5:11 PM) Patient words: f/u.  The patient is a 44 year old female who presents with breast cancer. Pt is status post left mastectomy with sentinel lymph node biopsy followed by axillary lymph node dissection, left thyroid lobectomy April 23, 2014. 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 She received neoadjuvant chemotherapy starting in May 2015. She had some dehiscence of the lateral aspect of her wound. This healed nicely. She did some work with physical therapy and regained good mobility of her arm. She also received adjuvant radiation.  She denies any new problems with her mastectomy scar. She denies any new masses in her right breast. She had her right mammogram June 2016 and had no issues. She also got a hysterectomy/BSO recently. She is due to see plastic surgery. She is tolerating tamoxifen. She is experiencing some left arm swelling.    Allergies (Shelly Hall, CMA; 01/18/2015 3:46 PM) Cough & Cold *COUGH/COLD/ALLERGY* All COUGH SYRUPS  Medication History (Shelly Hall, CMA; 01/18/2015 3:46 PM) Zovirax (400MG  Tablet, Oral Daily) Active. Lopid (600MG  Tablet, Oral) Active. Lisinopril (10MG  Tablet, Oral) Active. Pyridoxine HCl (100MG  Tablet, Oral) Active. Proventil HFA (108 (90 Base)MCG/ACT Aerosol Soln, Inhalation AS needed) Active. Tenoretic 50 (50-25MG  Tablet, Oral Daily) Active. Colace (100MG  Capsule, Oral AS needed) Active. Iron (Ferrous Gluconate) (325MG  Tablet, Oral daily) Active. Neurontin (300MG  Capsule, 1 bid Oral daily) Active. Ativan (0.5MG  Tablet, Oral as needed) Active. MetFORMIN HCl ER (OSM) (500MG  Tablet ER 24HR, bid Oral daily)  Active. Multivitamin & Mineral (Oral daily) Active. Protonix (40MG  Packet, Oral daily) Active. Medications Reconciled  Review of Systems Shelly Klein MD; 01/20/2015 5:11 PM) All other systems negative   Vitals (Shelly Hall CMA; 01/18/2015 3:46 PM) 01/18/2015 3:45 PM Weight: 181 lb Height: 63in Body Surface Area: 1.91 m Body Mass Index: 32.06 kg/m Temp.: 98.35F(Temporal)  Pulse: 77 (Regular)  BP: 128/80 (Sitting, Left Arm, Standard)    Physical Exam Shelly Klein MD; 01/20/2015 5:12 PM) General Mental Status-Alert. General Appearance-Consistent with stated age. Hydration-Well hydrated. Voice-Normal.  Integumentary Note: faint swelling on left arm.   Chest and Lung Exam Chest and lung exam reveals -quiet, even and easy respiratory effort with no use of accessory muscles. Inspection Chest Wall - Normal. Back - normal.  Breast Note: right breast without masses or skin dimpling. No palpable adenopathy. no nipple discharge or skin dimpling. left chest wall without masses. no LAD on left.   Abdomen Inspection-Inspection Normal. Palpation/Percussion Palpation and Percussion of the abdomen reveal - Soft, Non Tender, No Rebound tenderness, No Rigidity (guarding) and No hepatosplenomegaly.    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  Will refer back to PT for left arm lymphedema. Will also schedule for port a cath removal.  Patient has appointment with Shelly Hall next week to discuss possible reconstruction.  I will see her back in around 6 months. Current Plans  Schedule for Surgery Referred to Physical Therapy, for evaluation and follow up (Physical Therapy).   Signed by Shelly Klein, MD (01/20/2015 5:17 PM)

## 2015-02-03 NOTE — Interval H&P Note (Signed)
History and Physical Interval Note:  02/03/2015 12:15 PM  Shelly Hall  has presented today for surgery, with the diagnosis of BREAST CANCER, UNNEEDED PORT  The various methods of treatment have been discussed with the patient and family. After consideration of risks, benefits and other options for treatment, the patient has consented to  Procedure(s): REMOVAL PORT-A-CATH (N/A) as a surgical intervention .  The patient's history has been reviewed, patient examined, no change in status, stable for surgery.  I have reviewed the patient's chart and labs.  Questions were answered to the patient's satisfaction.     Christianjames Soule

## 2015-02-03 NOTE — Anesthesia Preprocedure Evaluation (Signed)
Anesthesia Evaluation  Patient identified by MRN, date of birth, ID band Patient awake    Reviewed: Allergy & Precautions, NPO status , Patient's Chart, lab work & pertinent test results  Airway Mallampati: I  TM Distance: >3 FB Neck ROM: Full    Dental   Pulmonary Current Smoker,    Pulmonary exam normal       Cardiovascular hypertension, Pt. on medications Normal cardiovascular exam    Neuro/Psych Anxiety Depression    GI/Hepatic GERD-  Medicated and Controlled,  Endo/Other  diabetes, Type 2, Oral Hypoglycemic Agents  Renal/GU      Musculoskeletal   Abdominal   Peds  Hematology   Anesthesia Other Findings   Reproductive/Obstetrics                             Anesthesia Physical Anesthesia Plan  ASA: II  Anesthesia Plan: MAC   Post-op Pain Management:    Induction: Intravenous  Airway Management Planned: Simple Face Mask  Additional Equipment:   Intra-op Plan:   Post-operative Plan:   Informed Consent: I have reviewed the patients History and Physical, chart, labs and discussed the procedure including the risks, benefits and alternatives for the proposed anesthesia with the patient or authorized representative who has indicated his/her understanding and acceptance.     Plan Discussed with: CRNA and Surgeon  Anesthesia Plan Comments:         Anesthesia Quick Evaluation

## 2015-02-03 NOTE — Discharge Instructions (Addendum)
[place] Office Phone Number [phone]   POST OP INSTRUCTIONS  Always review your discharge instruction sheet given to you by the facility where your surgery was performed.  IF YOU HAVE DISABILITY OR FAMILY LEAVE FORMS, YOU MUST BRING THEM TO THE OFFICE FOR PROCESSING.  DO NOT GIVE THEM TO YOUR DOCTOR.  1. A prescription for pain medication may be given to you upon discharge.  Take your pain medication as prescribed, if needed.  If narcotic pain medicine is not needed, then you may take acetaminophen (Tylenol) or ibuprofen (Advil) as needed. 2. Take your usually prescribed medications unless otherwise directed 3. If you need a refill on your pain medication, please contact your pharmacy.  They will contact our office to request authorization.  Prescriptions will not be filled after 5pm or on week-ends. 4. You should eat very light the first 24 hours after surgery, such as soup, crackers, pudding, etc.  Resume your normal diet the day after surgery 5. It is common to experience some constipation if taking pain medication after surgery.  Increasing fluid intake and taking a stool softener will usually help or prevent this problem from occurring.  A mild laxative (Milk of Magnesia or Miralax) should be taken according to package directions if there are no bowel movements after 48 hours. 6. You may shower in 48 hours.  The surgical glue will flake off in 2-3 weeks.   7. ACTIVITIES:  No strenuous activity or heavy lifting for 1 week.   a. You may drive when you no longer are taking prescription pain medication, you can comfortably wear a seatbelt, and you can safely maneuver your car and apply brakes. b. RETURN TO WORK:  __________1 week if applicable_______________ Dennis Bast should see your doctor in the office for a follow-up appointment approximately three-four weeks after your surgery.    WHEN TO CALL YOUR DOCTOR: 1. Fever over 101.0 2. Nausea and/or vomiting. 3. Extreme  swelling or bruising. 4. Continued bleeding from incision. 5. Increased pain, redness, or drainage from the incision.  The clinic staff is available to answer your questions during regular business hours.  Please dont hesitate to call and ask to speak to one of the nurses for clinical concerns.  If you have a medical emergency, go to the nearest emergency room or call 911.  A surgeon from [facility] is always on call at the hospital.  For further questions, please visit [email or web address]      Post Anesthesia Home Care Instructions  Activity: Get plenty of rest for the remainder of the day. A responsible adult should stay with you for 24 hours following the procedure.  For the next 24 hours, DO NOT: -Drive a car -Paediatric nurse -Drink alcoholic beverages -Take any medication unless instructed by your physician -Make any legal decisions or sign important papers.  Meals: Start with liquid foods such as gelatin or soup. Progress to regular foods as tolerated. Avoid greasy, spicy, heavy foods. If nausea and/or vomiting occur, drink only clear liquids until the nausea and/or vomiting subsides. Call your physician if vomiting continues.  Special Instructions/Symptoms: Your throat may feel dry or sore from the anesthesia or the breathing tube placed in your throat during surgery. If this causes discomfort, gargle with warm salt water. The discomfort should disappear within 24 hours.  If you had a scopolamine patch placed behind your ear for the management of post- operative nausea and/or vomiting:  1. The medication in the patch is effective for 72 hours,  should be removed.  Wrap patch in a tissue and discard in the trash. Wash hands thoroughly with soap and water. °2. You may remove the patch earlier than 72 hours if you experience unpleasant side effects which may include dry mouth, dizziness or visual disturbances. °3. Avoid touching the patch. Wash  your hands with soap and water after contact with the patch. °   ° °

## 2015-02-03 NOTE — Anesthesia Postprocedure Evaluation (Signed)
  Anesthesia Post-op Note  Patient: Shelly Hall  Procedure(s) Performed: Procedure(s) (LRB): REMOVAL PORT-A-CATH (Left)  Patient Location: PACU  Anesthesia Type: MAC  Level of Consciousness: awake and alert   Airway and Oxygen Therapy: Patient Spontanous Breathing  Post-op Pain: mild  Post-op Assessment: Post-op Vital signs reviewed, Patient's Cardiovascular Status Stable, Respiratory Function Stable, Patent Airway and No signs of Nausea or vomiting  Last Vitals:  Filed Vitals:   02/03/15 1400  BP: 122/82  Pulse: 54  Temp:   Resp: 15    Post-op Vital Signs: stable   Complications: No apparent anesthesia complications

## 2015-02-03 NOTE — Transfer of Care (Signed)
Immediate Anesthesia Transfer of Care Note  Patient: Shelly Hall  Procedure(s) Performed: Procedure(s): REMOVAL PORT-A-CATH (Left)  Patient Location: PACU  Anesthesia Type:MAC  Level of Consciousness: sedated  Airway & Oxygen Therapy: Patient Spontanous Breathing and Patient connected to face mask oxygen  Post-op Assessment: Report given to RN and Post -op Vital signs reviewed and stable  Post vital signs: Reviewed and stable  Last Vitals:  Filed Vitals:   02/03/15 1147  BP: 120/77  Temp: 36.7 C  Resp: 16    Complications: No apparent anesthesia complications

## 2015-02-04 ENCOUNTER — Encounter (HOSPITAL_BASED_OUTPATIENT_CLINIC_OR_DEPARTMENT_OTHER): Payer: Self-pay | Admitting: General Surgery

## 2015-02-18 ENCOUNTER — Other Ambulatory Visit (HOSPITAL_BASED_OUTPATIENT_CLINIC_OR_DEPARTMENT_OTHER): Payer: Medicaid Other

## 2015-02-18 ENCOUNTER — Encounter: Payer: Self-pay | Admitting: Nurse Practitioner

## 2015-02-18 ENCOUNTER — Ambulatory Visit (HOSPITAL_BASED_OUTPATIENT_CLINIC_OR_DEPARTMENT_OTHER): Payer: Medicaid Other | Admitting: Nurse Practitioner

## 2015-02-18 ENCOUNTER — Ambulatory Visit: Payer: Medicaid Other | Attending: General Surgery | Admitting: Physical Therapy

## 2015-02-18 VITALS — BP 119/91 | HR 85 | Temp 98.8°F | Resp 18 | Ht 63.0 in | Wt 185.1 lb

## 2015-02-18 DIAGNOSIS — C773 Secondary and unspecified malignant neoplasm of axilla and upper limb lymph nodes: Secondary | ICD-10-CM | POA: Diagnosis not present

## 2015-02-18 DIAGNOSIS — Z79810 Long term (current) use of selective estrogen receptor modulators (SERMs): Secondary | ICD-10-CM | POA: Diagnosis not present

## 2015-02-18 DIAGNOSIS — I972 Postmastectomy lymphedema syndrome: Secondary | ICD-10-CM | POA: Diagnosis present

## 2015-02-18 DIAGNOSIS — Z1329 Encounter for screening for other suspected endocrine disorder: Secondary | ICD-10-CM

## 2015-02-18 DIAGNOSIS — E0789 Other specified disorders of thyroid: Secondary | ICD-10-CM

## 2015-02-18 DIAGNOSIS — B373 Candidiasis of vulva and vagina: Secondary | ICD-10-CM

## 2015-02-18 DIAGNOSIS — C50412 Malignant neoplasm of upper-outer quadrant of left female breast: Secondary | ICD-10-CM | POA: Diagnosis present

## 2015-02-18 DIAGNOSIS — Z170 Estrogen receptor positive status [ER+]: Secondary | ICD-10-CM

## 2015-02-18 DIAGNOSIS — B3731 Acute candidiasis of vulva and vagina: Secondary | ICD-10-CM

## 2015-02-18 DIAGNOSIS — L905 Scar conditions and fibrosis of skin: Secondary | ICD-10-CM

## 2015-02-18 DIAGNOSIS — R232 Flushing: Secondary | ICD-10-CM

## 2015-02-18 DIAGNOSIS — I890 Lymphedema, not elsewhere classified: Secondary | ICD-10-CM | POA: Insufficient documentation

## 2015-02-18 LAB — COMPREHENSIVE METABOLIC PANEL (CC13)
ALBUMIN: 3.7 g/dL (ref 3.5–5.0)
ALK PHOS: 64 U/L (ref 40–150)
ALT: 21 U/L (ref 0–55)
AST: 16 U/L (ref 5–34)
Anion Gap: 7 mEq/L (ref 3–11)
BUN: 16 mg/dL (ref 7.0–26.0)
CO2: 29 mEq/L (ref 22–29)
Calcium: 9.7 mg/dL (ref 8.4–10.4)
Chloride: 106 mEq/L (ref 98–109)
Creatinine: 1 mg/dL (ref 0.6–1.1)
EGFR: 77 mL/min/{1.73_m2} — AB (ref >90)
GLUCOSE: 169 mg/dL — AB (ref 70–140)
POTASSIUM: 4.3 meq/L (ref 3.5–5.1)
SODIUM: 142 meq/L (ref 136–145)
Total Bilirubin: <0.2 mg/dL (ref 0.20–1.20)
Total Protein: 7.7 g/dL (ref 6.4–8.3)

## 2015-02-18 LAB — CBC WITH DIFFERENTIAL/PLATELET
BASO%: 0.2 % (ref 0.0–2.0)
BASOS ABS: 0 10*3/uL (ref 0.0–0.1)
EOS ABS: 0.2 10*3/uL (ref 0.0–0.5)
EOS%: 3 % (ref 0.0–7.0)
HCT: 36 % (ref 34.8–46.6)
HEMOGLOBIN: 11.8 g/dL (ref 11.6–15.9)
LYMPH%: 19.9 % (ref 14.0–49.7)
MCH: 29.9 pg (ref 25.1–34.0)
MCHC: 32.8 g/dL (ref 31.5–36.0)
MCV: 91.4 fL (ref 79.5–101.0)
MONO#: 0.3 10*3/uL (ref 0.1–0.9)
MONO%: 6.3 % (ref 0.0–14.0)
NEUT#: 3.8 10*3/uL (ref 1.5–6.5)
NEUT%: 70.6 % (ref 38.4–76.8)
Platelets: 250 10*3/uL (ref 145–400)
RBC: 3.94 10*6/uL (ref 3.70–5.45)
RDW: 13.1 % (ref 11.2–14.5)
WBC: 5.4 10*3/uL (ref 3.9–10.3)
lymph#: 1.1 10*3/uL (ref 0.9–3.3)

## 2015-02-18 LAB — TSH CHCC: TSH: 1.317 m(IU)/L (ref 0.308–3.960)

## 2015-02-18 MED ORDER — GABAPENTIN 300 MG PO CAPS: 600.0000 mg | ORAL_CAPSULE | Freq: Two times a day (BID) | ORAL | Status: DC

## 2015-02-18 MED ORDER — TAMOXIFEN CITRATE 20 MG PO TABS: 20.0000 mg | ORAL_TABLET | Freq: Every day | ORAL | Status: DC

## 2015-02-18 MED ORDER — FLUCONAZOLE 150 MG PO TABS: 150.0000 mg | ORAL_TABLET | Freq: Every day | ORAL | Status: DC

## 2015-02-18 NOTE — Progress Notes (Signed)
Shelly Hall  [phone] Fax:([phone]) [phone]    ID: Shelly Hall OB: 06/13/1970  MR#: [id number]  XTG#:[id number]  PCP: Shelly Peers, MD GYN:  Shelly Hall SU: Shelly Hall OTHER MD: Shelly Hall  CHIEF COMPLAINT:  Left Breast Cancer, estrogen receptor positive CURRENT TREATMENT:  To begin antiestrogen therapy  BREAST CANCER HISTORY: From the original intake note:  Shelly Hall palpated a mass in her left breast mid-April 2015 and immediately brought it to Dr. Benjie Hall' attention. She was set up for bilateral screening mammography with tomography at [facility] 09/19/2013. The possible distortion in the left breast and a nearby group of calcifications was felt to warrant further evaluation, and on 09/30/2013 she underwent unilateral left diagnostic mammography and ultrasonography. There was an irregular mass in the outer left breast associated with microcalcifications. 4 cm anterior to this there was another cluster of pleomorphic calcifications spanning 7 mm. He had more anteriorly there was an irregular mass associated with other calcifications. In total the abnormalities in the left breast spent approximately 10 cm, and is segmental distribution. On exam there was a palpable firm mass at the 2:30 o'clock position of the left breast 10 cm from the nipple. There was palpable adenopathy in the inferior left axilla. Ultrasound confirmed an irregular hypoechoic mass in the left breast measuring 4.6 cm. In addition, a 1.4 cm oval slightly irregular mass was noted at the 3:00 position and yet another mass measured 9 mm in the same area. Ultrasound of the left axilla showed a dominant 3.6 cm lymph node.  Biopsy of 2 of the left breast masses and the suspicious left breast lymph node on 10/03/2013 showed ([id number]) an invasive ductal carcinoma, grade 2, estrogen receptor 90% positive with strong staining intensity, progesterone receptor 40% positive but moderate staining  intensity, with an MIB-1 of 20% and no HER-2 amplification. (Because all 3 biopsies were morphologically identical, only one prognostic panel was sent).  On 10/10/2013 the patient underwent bilateral breast MRI. This showed numerous enhancing masses in the left breast, the largest measuring 3.5 cm, and the aggregate measuring 12.7 cm. There were numerous enlarged left axillary lymph nodes, at least 5 of which were enlarged, both at levels 1 and 2. The largest measured 2.3 cm. The right breast was negative.  The patient's subsequent history is as detailed below.  INTERVAL HISTORY: Shelly Hall returns today for follow up of her breast cancer. She has been on tamoxifen since May of this year and tolerates this relatively well. She had a history of hot flashes that was managed moderately well on gabapentin. She had an abdominal hysterectomy and bilateral salpingo oophorectomy at the end of last month and she wonders if this has made her hot flashes worse. She is also having vaginal dryness. She would like a breast reduction on the right side, but she was told that she needed to quit smoking first. She has taken this seriously and attends smoking cessation classes regularly.  REVIEW OF SYSTEMS: Shelly Hall denies fevers, chills, nausea, vomiting, or changes in bowel or bladder habits. She is eating and drinking well. She denies shortness of breath, chest pain, cough, or palpitations. She is not exercising regularly. She developed left upper extremity lymphedema and now wears a compression sleeve full time. She will also be outfitted with a compression machine to use at home, as medicaid is not paying for any more PT this year. She was started on antibiotics after some dental work last week and she believes this has  given her yeast infection. Her blood sugars are well controlled. A detailed review of systems is otherwise stable.  PAST MEDICAL HISTORY: Past Medical History  Diagnosis Date  . Hypertension   .  Sarcoidosis     no problems per patient   . Anemia   . Complication of anesthesia     makes pt. restless and unable to be still  . High cholesterol   . Thyroid cancer 2015  . Type II diabetes mellitus   . Breast cancer 2015    left upper outer;  had chemo  . Radiation 06/23/14-08/11/14    Invasive ductal ca o left breast  . SVD (spontaneous vaginal delivery)     x 2  . Heart murmur     never had any problems as adult  . Bronchitis 2014  . Depression     no meds currently  . Anxiety     no meds currently  . GERD (gastroesophageal reflux disease)     PAST SURGICAL HISTORY: Past Surgical History  Procedure Laterality Date  . Tubal ligation  2001  . Endobronchial ultrasound Bilateral 12/02/2012    Procedure: ENDOBRONCHIAL ULTRASOUND;  Surgeon: Shelly Gobble, MD;  Location: [facility];  Service: Cardiopulmonary;  Laterality: Bilateral;  . Portacath placement N/A 10/22/2013    Procedure: INSERTION PORT-A-CATH;  Surgeon: Shelly Klein, MD;  Location: [place];  Service: General;  Laterality: N/A;  . Lung biopsy  2014  . Mastectomy w/ sentinel node biopsy Left 04/23/2014    & axillary LND  . Mastectomy w/ sentinel node biopsy Left 04/23/2014    Procedure: LEFT BREAST MASTECTOMY WITH SENTINEL LYMPH NODE BIOPSY AND AXILLARY LYMPH NODE DISECTION;  Surgeon: Shelly Klein, MD;  Location: [place];  Service: General;  Laterality: Left;  . Thyroid lobectomy Left 04/23/2014    Procedure: LEFT THYROID LOBECTOMY;  Surgeon: Shelly Klein, MD;  Location: [place];  Service: General;  Laterality: Left;  . Breast biopsy Left 2015 X 3    biopsy  . Wisdom tooth extraction    . Leep    . Laparoscopic assisted vaginal hysterectomy N/A 12/09/2014    Procedure: LAPAROSCOPIC ASSISTED VAGINAL HYSTERECTOMY;  Surgeon: Shelly Fowler, MD;  Location: [facility];  Service: Gynecology;  Laterality: N/A;  . Salpingoophorectomy Bilateral 12/09/2014    Procedure: SALPINGO OOPHORECTOMY;  Surgeon: Shelly Fowler, MD;  Location: [place] [facility];  Service: Gynecology;  Laterality: Bilateral;  . Abdominal hysterectomy    . Port-a-cath removal Left 02/03/2015    Procedure: REMOVAL PORT-A-CATH;  Surgeon: Shelly Klein, MD;  Location: [place];  Service: General;  Laterality: Left;    FAMILY HISTORY Family History  Problem Relation Age of Onset  . Cancer Paternal Aunt     "bone cancer"; deceased 30s  . Cancer Paternal Uncle     stomach cancer; deceased 4s  . Cancer Paternal Aunt     unk. primary; currently 16s  . Prostate cancer Maternal Grandfather 24  . Asthma Mother   The patient's parents are living, in fair mid to late 51s. The patient had one brother, no sisters. There is no history of breast or ovarian cancer in the family to her knowledge.   GYNECOLOGIC HISTORY:   (Reviewed 11/27/2013)  menarche age 60, first live birth at 67. She is GX P2. She was still having regular periods at the time of the start of her chemotherapy in May 2015.  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 She  is single and lives alone, although her mother is currently staying with her. Her son Shelly Hall lives in [place] and works at [employer]. Son Shelly Hall lives in [place] where he works at a [employer]. The patient has 1 grandchild born in Sept 2015, by her son, Shelly Cowing. She is a Psychologist, forensic.    ADVANCED DIRECTIVES: not in place   HEALTH MAINTENANCE: (Updated 11/27/2013) Social History  Substance Use Topics  . Smoking status: Current Some Day Smoker -- 0.25 packs/day for 25 years    Types: Cigarettes  . Smokeless tobacco: Never Used     Comment: 04/23/2014 "using vapor; down to 3-4 cigarettes/day now"  . Alcohol Use: 6.0 oz/week    4 Cans of beer, 4 Shots of liquor, 2 Standard drinks or equivalent per week     Colonoscopy: Never  PAP: Not on file  Bone density: Not on file  Lipid panel:  Not on file  Allergies  Allergen Reactions  . Nyquil  Multi-Symptom [Pseudoeph-Doxylamine-Dm-Apap] Itching and Other (See Comments)    Skin peels on hands, and feet    Current Outpatient Prescriptions  Medication Sig Dispense Refill  . albuterol (PROVENTIL HFA;VENTOLIN HFA) 108 (90 BASE) MCG/ACT inhaler Inhale 2 puffs into the lungs every 6 (six) hours as needed.    Marland Kitchen atenolol-chlorthalidone (TENORETIC) 50-25 MG per tablet Take 1 tablet by mouth daily. 30 tablet 1  . ferrous sulfate 325 (65 FE) MG tablet Take 325 mg by mouth daily with breakfast.     . gabapentin (NEURONTIN) 300 MG capsule Take 1 capsule in am and 2 capsules at bedtime. (Patient taking differently: Take 300-600 mg by mouth 2 (two) times daily. Take 1 capsule in am and 2 capsules at bedtime.) 90 capsule 5  . metFORMIN (GLUCOPHAGE) 1000 MG tablet Take 1,000 mg by mouth 2 (two) times daily with a meal.     . Multiple Vitamin (MULTIVITAMIN WITH MINERALS) TABS tablet Take 1 tablet by mouth daily.    . tamoxifen (NOLVADEX) 20 MG tablet Take 1 tablet (20 mg total) by mouth daily. (Patient taking differently: Take 20 mg by mouth daily. Pt started med on 10/10/2014.) 30 tablet 3   No current facility-administered medications for this visit.    OBJECTIVE: Middle-aged [place] American woman in no acute distress Filed Vitals:   02/18/15 1422  BP: 119/91  Pulse: 85  Temp: 98.8 F (37.1 C)  Resp: 18     Body mass index is 32.79 kg/(m^2).    ECOG FS:1 - Symptomatic but completely ambulatory   Skin: warm, dry  HEENT: sclerae anicteric, conjunctivae pink, oropharynx clear. No thrush or mucositis.  Lymph Nodes: No cervical or supraclavicular lymphadenopathy  Lungs: clear to auscultation bilaterally, no rales, wheezes, or rhonci  Heart: regular rate and rhythm  Abdomen: round, soft, non tender, positive bowel sounds  Musculoskeletal: No focal spinal tenderness, grade 1 left upper extremity lymphedema in compression sleeve  Neuro: non focal, well oriented, positive affect  Breasts: left  breast status post lumpectomy and radiation. Residual hyperpigmentation present. No evidence of recurrent disease. Left axilla benign. Right breast pendulous, but otherwise unremarkable.  LAB RESULTS:   Lab Results  Component Value Date   WBC 5.4 02/18/2015   NEUTROABS 3.8 02/18/2015   HGB 11.8 02/18/2015   HCT 36.0 02/18/2015   MCV 91.4 02/18/2015   PLT 250 02/18/2015      Chemistry      Component Value Date/Time   NA 142 02/18/2015 1408   NA  141 02/03/2015 1202   K 4.3 02/18/2015 1408   K 3.9 02/03/2015 1202   CL 106 02/03/2015 1202   CO2 29 02/18/2015 1408   CO2 27 12/02/2014 1509   BUN 16.0 02/18/2015 1408   BUN 18 02/03/2015 1202   CREATININE 1.0 02/18/2015 1408   CREATININE 0.90 02/03/2015 1202      Component Value Date/Time   CALCIUM 9.7 02/18/2015 1408   CALCIUM 9.4 12/02/2014 1509   ALKPHOS 64 02/18/2015 1408   ALKPHOS 76 12/02/2014 1509   AST 16 02/18/2015 1408   AST 24 12/02/2014 1509   ALT 21 02/18/2015 1408   ALT 23 12/02/2014 1509   BILITOT <0.20 Repeated and Verified 02/18/2015 1408   BILITOT 0.2* 12/02/2014 1509      STUDIES: No results found. CLINICAL DATA: Screening.  EXAM: DIGITAL SCREENING UNILATERAL RIGHT MAMMOGRAM WITH CAD  COMPARISON: Previous exam(s).  ACR Breast Density Category b: There are scattered areas of fibroglandular density.  FINDINGS: There are no findings suspicious for malignancy. There has been a left mastectomy. Images were processed with CAD.  IMPRESSION: No mammographic evidence of malignancy. A result letter of this screening mammogram will be mailed directly to the patient.  RECOMMENDATION: Screening mammogram in one year. (Code:SM-B-01Y)  BI-RADS CATEGORY 1: Negative.   Electronically Signed  By: Altamese Cabal M.D.  On: 11/04/2014 10:44  ASSESSMENT: 44 y.o. BRCA negative [place] woman with a history of sarcoidosis and breast cancer as follows:  (1)  status post left breast and  left axillary lymph node biopsy 10/03/2013, both positive for a clinical T2 N2, stage IIIA invasive ductal carcinoma, grade 2, estrogen and progesterone receptor positive, HER-2 negative, with an MIB-1 of 20%.  (2) Treated with neoadjuvant chemotherapy consisting of doxorubicin and cyclophosphamide in dose dense fashion x4, completed 12/11/2013, followed by paclitaxel weekly x12 (dose reduced by 20% at cycle 5 because of neuropathy concerns) completed 03/19/2014.  (3) status post left mastectomy and left axillary lymph node dissection 04/23/2014 for a pT1c pN2, stage IIIA invasive ductal carcinoma, grade 3, estrogen receptor 99% positive, progesterone receptor 4% positive, with no HER-2 amplification  (3) adjuvant radiation completed 08/11/2014  (4) tamoxifen started 10/10/14  (5) continuing tobacco abuse: The patient has been strongly advised to discontinue smoking  (6) genetic testing sent 10/16/2013 did not reveal any deleterious mutation in any of these genes. The genes tested were ATM, BARD1, BRCA1, BRCA2, BRIP1, CDH1, CHEK2, MRE11A, MUTYH, NBN, NF1, PALB2, PTEN, RAD50, RAD51C, RAD51D, and TP53.  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 Attending quitting cessation therapy with the moviation that she must quit before she has a breast reduction.   PLAN: Ira is doing well as far as her breast cancer is concerned. She had a mammogram in June that was unremarkable. The labs were reviewed in detail and were entirely stable. She is tolerating the tamoxifen well, but is having increased hot flashes from her recent TAH-BSO. She claims that medicaid will not cover the venlafaxine prescribed at her last visit. Her blood pressure is low enough that I dont believe clonidine patches would be a good addition. She tolerates  the gabapentin well, it is effective, and does not make her drowsy. Because of this I suggested she continue the 674m at night and 3029mfirst thing in the morning, but add a 3rd late  afternoon dose of 339m as well since the morning dose seems to wear off. Of course for this reason, gabapentin is commonly given TID.   For her vaginal yeast, I have sent in a prescription for fluconazole.   She will continue to wear her her compression sleeve and use the compression device that she will soon receive for home therapy.  JCandiceis stable and will return in 6 months for labs and a follow up visit. She understands and agrees with this plan. She knows the goal of treatment in her case is cure. She has been encouraged to call with any issues that might arise before her next visit here.   HLaurie Panda NP 02/18/2015 2:50 PM

## 2015-02-18 NOTE — Therapy (Signed)
[place], [place], [address] Phone: [phone]   Fax:  [phone]  Physical Therapy Evaluation  Patient Details  Name: Shelly Hall MRN: [id number] Date of Birth: [birth year] Referring Provider:  Dr. Barry Dienes  Encounter Date: 02/18/2015      PT End of Session - 02/18/15 1300    Visit Number 1   Number of Visits 4   Date for PT Re-Evaluation 04/20/15   PT Start Time 0940   PT Stop Time 1015   PT Time Calculation (min) 35 min      Past Medical History  Diagnosis Date  . Hypertension   . Sarcoidosis     no problems per patient   . Anemia   . Complication of anesthesia     makes pt. restless and unable to be still  . High cholesterol   . Thyroid cancer 2015  . Type II diabetes mellitus   . Breast cancer 2015    left upper outer;  had chemo  . Radiation 06/23/14-08/11/14    Invasive ductal ca o left breast  . SVD (spontaneous vaginal delivery)     x 2  . Heart murmur     never had any problems as adult  . Bronchitis 2014  . Depression     no meds currently  . Anxiety     no meds currently  . GERD (gastroesophageal reflux disease)     Past Surgical History  Procedure Laterality Date  . Tubal ligation  2001  . Endobronchial ultrasound Bilateral 12/02/2012    Procedure: ENDOBRONCHIAL ULTRASOUND;  Surgeon: Collene Gobble, MD;  Location: [facility];  Service: Cardiopulmonary;  Laterality: Bilateral;  . Portacath placement N/A 10/22/2013    Procedure: INSERTION PORT-A-CATH;  Surgeon: [name], MD;  Location: [facility];  Service: General;  Laterality: N/A;  . Lung biopsy  2014  . Mastectomy w/ sentinel node biopsy Left 04/23/2014    & axillary LND  . Mastectomy w/ sentinel node biopsy Left 04/23/2014    Procedure: LEFT BREAST MASTECTOMY WITH SENTINEL LYMPH NODE BIOPSY AND AXILLARY LYMPH NODE DISECTION;  Surgeon: [name], MD;  Location: [facility];  Service: General;  Laterality: Left;  .  Thyroid lobectomy Left 04/23/2014    Procedure: LEFT THYROID LOBECTOMY;  Surgeon: [name], MD;  Location: [facility];  Service: General;  Laterality: Left;  . Breast biopsy Left 2015 X 3    biopsy  . Wisdom tooth extraction    . Leep    . Laparoscopic assisted vaginal hysterectomy N/A 12/09/2014    Procedure: LAPAROSCOPIC ASSISTED VAGINAL HYSTERECTOMY;  Surgeon: Cheri Fowler, MD;  Location: [facility];  Service: Gynecology;  Laterality: N/A;  . Salpingoophorectomy Bilateral 12/09/2014    Procedure: SALPINGO OOPHORECTOMY;  Surgeon: Cheri Fowler, MD;  Location: [facility];  Service: Gynecology;  Laterality: Bilateral;  . Abdominal hysterectomy    . Port-a-cath removal Left 02/03/2015    Procedure: REMOVAL PORT-A-CATH;  Surgeon: [name], MD;  Location: [facility];  Service: General;  Laterality: Left;    There were no vitals filed for this visit.  Visit Diagnosis:  Scar condition and fibrosis of skin - Plan: PT plan of care cert/re-cert  Lymphedema syndrome, postmastectomy - Plan: PT plan of care cert/re-cert      Subjective Assessment - 02/18/15 0945    Subjective pt states she has been having more pain in left upper arm chest wall  and feels that is is more swollen  She still has some decrease in range of motion. She finds her daytime sleeve to be uncomfortable.  She gets more relief from her Belisse bra and pad and her nighttme garment    Pertinent History left mastectomy Novemver 19, 2015 with 11 lymph nodes removed. chemotherapy before surgery and radiation completed march 2016.  We can to our clinic for therapy before she went to radiation in December 2015-january 2016    she has a flat knit sleeve and glove that she wears intermittently. She also decribes a Belisse compression bra with a serratus pad with axillary pad.  She feels that really helps when she wears it.  She  was evaluated for possible DVT fo left arm in June due to sharp pains under arm and shooting down arm.   She has a laprosopic hysterectomy in July    Patient Stated Goals to get rid of extra pain and swelling    Currently in Pain? No/denies            [facility] PT Assessment - 02/18/15 0001    Assessment   Medical Diagnosis breast cancer    Onset Date/Surgical Date 04/23/14   Hand Dominance Right   Precautions   Precaution Comments cancer    Restrictions   Weight Bearing Restrictions No   Balance Screen   Has the patient fallen in the past 6 months No   Has the patient had a decrease in activity level because of a fear of falling?  No   Is the patient reluctant to leave their home because of a fear of falling?  No   Home Environment   Living Environment Private residence   Prior Function   Level of Independence Independent with basic ADLs  makes some modifications because of limitation left arm   Vocation Unemployed   Leisure walks occasionally, keep grandbaby sometimes.   Cognition   Overall Cognitive Status Within Functional Limits for tasks assessed   Observation/Other Assessments   Observations pt comes in wearing flat knit compression sleev and glove., mastectomy bra with posthesis,  When this was removed, she has obvious swelling at posterior upper chest, posterior axilla. She has discoloration of schest and contraction along incision line, especially at lateral boder with scar sunken in about 2 cm    Skin Integrity no open areas    Circumferential Edema   Circumferential - Left  left lateral chest and posterior axilla    Coordination   Gross Motor Movements are Fluid and Coordinated Yes   Other:   Other/ Comments Lymphedema Life Impact Scale:  26 or 38% impaired by lymphedma   Posture/Postural Control   Posture/Postural Control Postural limitations   Postural Limitations Rounded Shoulders;Forward head   AROM   Right Shoulder Flexion 163 Degrees   Right Shoulder ABduction 168 Degrees   Right Shoulder Internal Rotation 70 Degrees   Right Shoulder External Rotation 90  Degrees   Left Shoulder Flexion 146 Degrees   Left Shoulder ABduction 150 Degrees   Left Shoulder Internal Rotation 60 Degrees   Left Shoulder External Rotation 85 Degrees   Strength   Overall Strength Deficits   Right Shoulder Flexion 4/5   Right Shoulder ABduction 4/5   Left Shoulder Flexion 3-/5   Left Shoulder ABduction 3-/5   Palpation   Palpation comment radiation fibosis at anterior chest with decreased tissue extensisibility escpecially at lateral scar contracted area where it is 0%            LYMPHEDEMA/ONCOLOGY QUESTIONNAIRE - 02/18/15  1012    Right Upper Extremity Lymphedema   15 cm Proximal to Olecranon Process 33.6 cm   10 cm Proximal to Olecranon Process 31.7 cm   Olecranon Process 25.5 cm   10 cm Proximal to Ulnar Styloid Process 21 cm   Just Proximal to Ulnar Styloid Process 15.4 cm   Across Hand at [employer] 19.4 cm   At [employer] of 2nd Digit 6 cm   Left Upper Extremity Lymphedema   15 cm Proximal to Olecranon Process 34.5 cm   10 cm Proximal to Olecranon Process 32.3 cm   Olecranon Process 26.5 cm   10 cm Proximal to Ulnar Styloid Process 21 cm   Just Proximal to Ulnar Styloid Process 15.6 cm   Across Hand at [employer] 19.8 cm   At [place] of 2nd Digit 6 cm   Other 107.5  around chest at widest part with arms at sides.           Katina Dung - 02/18/15 0001    Open a tight or new jar No difficulty   Do heavy household chores (wash walls, wash floors) No difficulty   Carry a shopping bag or briefcase No difficulty   Wash your back Moderate difficulty   Use a knife to cut food No difficulty   Recreational activities in which you take some force or impact through your arm, shoulder, or hand (golf, hammering, tennis) Mild difficulty   During the past week, to what extent has your arm, shoulder or hand problem interfered with your normal social activities with family, friends, neighbors, or groups? Slightly   During the past week, to what extent has  your arm, shoulder or hand problem limited your work or other regular daily activities Slightly   Arm, shoulder, or hand pain. Moderate   Tingling (pins and needles) in your arm, shoulder, or hand Moderate   Difficulty Sleeping Moderate difficulty   DASH Score 25 %                     PT Education - 02/18/15 1247    Education provided Yes   Education Details continue to use Belisse bra and pad and Reid sleeve even during daytime for symptom management    Person(s) Educated Patient   Methods Explanation   Comprehension Verbalized understanding                    Plan - 02/18/15 1041    Clinical Impression Statement Ms Ganson returns to our clinic with exacerbation of lymphedmea and discomfort of left proximal chest, posterior axilla and left arm, especillay upper arm  She has  radiation fibrosis, mastectomy scar contracture and anterior chest myofascial tightnening.  She has developed these symtoms despite use daytime and nighttime compression garments  and self manual lymph drainage.  She is not able to reach her posteror shoulder and lateral chest to give adequate  self drainage.  She feels that her daytime garment is now too tight and is uncomfortable. She would benefit from compression pump that would address her trunk swelling and may need new compression sleeves. Unfortunately, her insurance will only allow 3 PT visits per calendar year and she has already had those.  She should be able to get coverage for the equipment so I will initiate that process.,    Pt will benefit from skilled therapeutic intervention in order to improve on the following deficits Increased fascial restricitons;Impaired UE functional use;Decreased scar mobility;Decreased  strength;Increased edema;Postural dysfunction   Rehab Potential --   Clinical Impairments Affecting Rehab Potential previous radiation    PT Frequency --   PT Treatment/Interventions --   PT Next Visit Plan No PT follow  up due to insurance constraints   Consulted and Agree with Plan of Care Patient         Problem List Patient Active Problem List   Diagnosis Date Noted  . S/P hysterectomy 12/09/2014  . Lymphedema 11/17/2014  . Gum symptoms 03/18/2014  . Peripheral neuropathy due to chemotherapy 01/22/2014  . Hot flashes 01/15/2014  . 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 Esophageal reflux 11/27/2013  . 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 Recurrent genital herpes 11/06/2013  . Chemotherapy induced neutropenia 11/06/2013  . Anemia, unspecified 11/06/2013  . Diabetes mellitus type II, non insulin dependent 11/06/2013  . Left thyroid nodule 11/06/2013  . Sarcoidosis 10/15/2013  . Breast cancer of upper-outer quadrant of left female breast 10/10/2013  . Mediastinal lymphadenopathy 11/21/2012  . Tobacco use disorder 11/21/2012      Donato Heinz. Owens Shark, PT  02/18/2015, 1:43 PM  [place], [place], [address] Phone: [phone]   Fax:  [phone]

## 2015-02-19 ENCOUNTER — Telehealth: Payer: Self-pay | Admitting: Oncology

## 2015-02-19 NOTE — Telephone Encounter (Signed)
lvm for pt regarding to OCT appt..... °

## 2015-02-24 NOTE — Progress Notes (Signed)
Received form from [facility] for compression garments.  Filled out, signed by Nira Conn, NP and faxed back.  Rec'd fax verification- placed in scanning.

## 2015-03-24 ENCOUNTER — Encounter: Payer: Self-pay | Admitting: Oncology

## 2015-03-24 NOTE — Progress Notes (Signed)
I placed form for patient for pump with vest and arm sleeve on desk of nurse for dr. Jana Hakim

## 2015-03-27 ENCOUNTER — Emergency Department (HOSPITAL_COMMUNITY)
Admission: EM | Admit: 2015-03-27 | Discharge: 2015-03-28 | Disposition: A | Discharge status: Home / Self Care | Payer: Medicaid Other | Attending: Emergency Medicine | Admitting: Emergency Medicine

## 2015-03-27 ENCOUNTER — Encounter (HOSPITAL_COMMUNITY): Payer: Self-pay | Admitting: Emergency Medicine

## 2015-03-27 DIAGNOSIS — Z8719 Personal history of other diseases of the digestive system: Secondary | ICD-10-CM | POA: Diagnosis not present

## 2015-03-27 DIAGNOSIS — R079 Chest pain, unspecified: Secondary | ICD-10-CM | POA: Insufficient documentation

## 2015-03-27 DIAGNOSIS — Z79818 Long term (current) use of other agents affecting estrogen receptors and estrogen levels: Secondary | ICD-10-CM | POA: Diagnosis not present

## 2015-03-27 DIAGNOSIS — Z853 Personal history of malignant neoplasm of breast: Secondary | ICD-10-CM | POA: Insufficient documentation

## 2015-03-27 DIAGNOSIS — Z85850 Personal history of malignant neoplasm of thyroid: Secondary | ICD-10-CM | POA: Insufficient documentation

## 2015-03-27 DIAGNOSIS — Z720 Tobacco use: Secondary | ICD-10-CM | POA: Insufficient documentation

## 2015-03-27 DIAGNOSIS — R011 Cardiac murmur, unspecified: Secondary | ICD-10-CM | POA: Diagnosis not present

## 2015-03-27 DIAGNOSIS — Z79899 Other long term (current) drug therapy: Secondary | ICD-10-CM | POA: Diagnosis not present

## 2015-03-27 DIAGNOSIS — F329 Major depressive disorder, single episode, unspecified: Secondary | ICD-10-CM | POA: Insufficient documentation

## 2015-03-27 DIAGNOSIS — Z8709 Personal history of other diseases of the respiratory system: Secondary | ICD-10-CM | POA: Diagnosis not present

## 2015-03-27 DIAGNOSIS — Z8639 Personal history of other endocrine, nutritional and metabolic disease: Secondary | ICD-10-CM | POA: Diagnosis not present

## 2015-03-27 DIAGNOSIS — E119 Type 2 diabetes mellitus without complications: Secondary | ICD-10-CM | POA: Insufficient documentation

## 2015-03-27 DIAGNOSIS — I10 Essential (primary) hypertension: Secondary | ICD-10-CM | POA: Insufficient documentation

## 2015-03-27 DIAGNOSIS — M549 Dorsalgia, unspecified: Secondary | ICD-10-CM | POA: Diagnosis present

## 2015-03-27 DIAGNOSIS — F419 Anxiety disorder, unspecified: Secondary | ICD-10-CM | POA: Insufficient documentation

## 2015-03-27 DIAGNOSIS — M7918 Myalgia, other site: Secondary | ICD-10-CM

## 2015-03-27 NOTE — ED Notes (Signed)
Unable to collect labs MD at bedside. 

## 2015-03-27 NOTE — ED Notes (Signed)
Pt states she has been having back pain all day today and tonight her left chest started hurting  Pt states it has been hurting for about an hour or so  Describes the pain as strong

## 2015-03-27 NOTE — ED Provider Notes (Signed)
CSN: [id number]     Arrival date & time 03/27/15  2324 History  By signing my name below, I, Hansel Feinstein, attest that this documentation has been prepared under the direction and in the presence of Merryl Hacker, MD. Electronically Signed: Hansel Feinstein, ED Scribe. 03/28/2015. 12:03 AM.    Chief Complaint  Patient presents with  . Back Pain  . Chest Pain   The history is provided by the patient. No language interpreter was used.   HPI Comments: Shelly Hall is a 44 y.o. female with h/o HTN, sarcoidosis, anemia, HLD, type II DM, heart murmur, GERD, breast cancer who presents to the Emergency Department complaining of moderate, sharp central CP onset a few hours ago. Pt states associated worsening 8/10 upper back pain onset yesterday. Pt also notes transient abdominal pain yesterday that has resolved. She states her pains are worse with movement. Pt notes that her back is bothering her more than her chest. She notes that she felt some similar pains last week after doing heavy lifting while moving but reports that they were resolved until yesterday. Pt has taken oxycodone with no relief. No other acute trauma or injuries. No oral contraceptive use. She denies SOB, fever, cough, current abdominal pain, nausea, vomiting, leg swelling.   Past Medical History  Diagnosis Date  . Hypertension   . Sarcoidosis ([facility] [place])     no problems per patient   . Anemia   . Complication of anesthesia     makes pt. restless and unable to be still  . High cholesterol   . Thyroid cancer ([facility]) 2015  . Type II diabetes mellitus ([facility])   . Breast cancer ([facility]) 2015    left upper outer;  had chemo  . Radiation 06/23/14-08/11/14    Invasive ductal ca o left breast  . SVD (spontaneous vaginal delivery)     x 2  . Heart murmur     never had any problems as adult  . Bronchitis 2014  . Depression     no meds currently  . Anxiety     no meds currently  . GERD (gastroesophageal reflux disease)    Past Surgical  History  Procedure Laterality Date  . Tubal ligation  2001  . Endobronchial ultrasound Bilateral 12/02/2012    Procedure: ENDOBRONCHIAL ULTRASOUND;  Surgeon: Collene Gobble, MD;  Location: [facility];  Service: Cardiopulmonary;  Laterality: Bilateral;  . Portacath placement N/A 10/22/2013    Procedure: INSERTION PORT-A-CATH;  Surgeon: [name], MD;  Location: [facility];  Service: General;  Laterality: N/A;  . Lung biopsy  2014  . Mastectomy w/ sentinel node biopsy Left 04/23/2014    & axillary LND  . Mastectomy w/ sentinel node biopsy Left 04/23/2014    Procedure: LEFT BREAST MASTECTOMY WITH SENTINEL LYMPH NODE BIOPSY AND AXILLARY LYMPH NODE DISECTION;  Surgeon: [name], MD;  Location: [facility];  Service: General;  Laterality: Left;  . Thyroid lobectomy Left 04/23/2014    Procedure: LEFT THYROID LOBECTOMY;  Surgeon: [name], MD;  Location: [facility];  Service: General;  Laterality: Left;  . Breast biopsy Left 2015 X 3    biopsy  . Wisdom tooth extraction    . Leep    . Laparoscopic assisted vaginal hysterectomy N/A 12/09/2014    Procedure: LAPAROSCOPIC ASSISTED VAGINAL HYSTERECTOMY;  Surgeon: Cheri Fowler, MD;  Location: [facility];  Service: Gynecology;  Laterality: N/A;  . Salpingoophorectomy Bilateral 12/09/2014    Procedure: SALPINGO OOPHORECTOMY;  Surgeon: Cheri Fowler, MD;  Location: [facility];  Service: Gynecology;  Laterality: Bilateral;  . Abdominal hysterectomy    . Port-a-cath removal Left 02/03/2015    Procedure: REMOVAL PORT-A-CATH;  Surgeon: [name], MD;  Location: [facility];  Service: General;  Laterality: Left;   Family History  Problem Relation Age of Onset  . Cancer Paternal Aunt     "bone cancer"; deceased 30s  . Cancer Paternal Uncle     stomach cancer; deceased 50s  . Cancer Paternal Aunt     unk. primary; currently 65s  . Prostate cancer Maternal Grandfather 64  . Asthma Mother    Social History  Substance Use Topics  . Smoking status:  Current Some Day Smoker -- 0.25 packs/day for 25 years    Types: Cigarettes  . Smokeless tobacco: Never Used     Comment: 04/23/2014 "using vapor; down to 3-4 cigarettes/day now"  . Alcohol Use: 6.0 oz/week    4 Cans of beer, 4 Shots of liquor, 2 Standard drinks or equivalent per week   OB History    No data available     Review of Systems  Constitutional: Negative for fever.  Respiratory: Negative for cough and shortness of breath.   Cardiovascular: Positive for chest pain. Negative for leg swelling.  Gastrointestinal: Negative for nausea, vomiting and abdominal pain.  Musculoskeletal: Positive for back pain.  All other systems reviewed and are negative.  Allergies  Nyquil multi-symptom  Home Medications   Prior to Admission medications   Medication Sig Start Date End Date Taking? Authorizing Provider  albuterol (PROVENTIL HFA;VENTOLIN HFA) 108 (90 BASE) MCG/ACT inhaler Inhale 2 puffs into the lungs every 6 (six) hours as needed for wheezing.    Yes Historical Provider, MD  atenolol-chlorthalidone (TENORETIC) 50-25 MG per tablet Take 1 tablet by mouth daily. 12/18/13  Yes Chauncey Cruel, MD  ferrous sulfate 325 (65 FE) MG tablet Take 325 mg by mouth daily with breakfast.    Yes Historical Provider, MD  gabapentin (NEURONTIN) 300 MG capsule Take 2 capsules (600 mg total) by mouth 2 (two) times daily. 02/18/15  Yes Laurie Panda, NP  metFORMIN (GLUCOPHAGE) 1000 MG tablet Take 1,000 mg by mouth 2 (two) times daily with a meal.  04/13/14  Yes Historical Provider, MD  Multiple Vitamin (MULTIVITAMIN WITH MINERALS) TABS tablet Take 1 tablet by mouth daily.   Yes Historical Provider, MD  oxyCODONE-acetaminophen (PERCOCET/ROXICET) 5-325 MG tablet Take 1 tablet by mouth every 6 (six) hours as needed for moderate pain or severe pain.   Yes Historical Provider, MD  tamoxifen (NOLVADEX) 20 MG tablet Take 1 tablet (20 mg total) by mouth daily. 02/18/15  Yes Laurie Panda, NP   cyclobenzaprine (FLEXERIL) 10 MG tablet Take 1 tablet (10 mg total) by mouth 2 (two) times daily as needed for muscle spasms. 03/28/15   Merryl Hacker, MD  fluconazole (DIFLUCAN) 150 MG tablet Take 1 tablet (150 mg total) by mouth daily. Take 1 tablet today then repeat in 3 days if necessary for yeast infection Patient not taking: Reported on 03/28/2015 02/18/15   Laurie Panda, NP  ibuprofen (ADVIL,MOTRIN) 600 MG tablet Take 1 tablet (600 mg total) by mouth every 6 (six) hours as needed. 03/28/15   Merryl Hacker, MD   BP 126/86 mmHg  Pulse 57  Temp(Src) 98 F (36.7 C) (Oral)  Resp 16  Ht 5\' 3"  (1.6 m)  Wt 185 lb (83.915 kg)  BMI 32.78 kg/m2  SpO2 100%  LMP  12/07/2014 Physical Exam  Constitutional: She is oriented to person, place, and time. She appears well-developed and well-nourished.  overweight  HENT:  Head: Normocephalic and atraumatic.  Eyes: Pupils are equal, round, and reactive to light.  Cardiovascular: Normal rate and regular rhythm.   Murmur heard. Pulmonary/Chest: Effort normal and breath sounds normal. No respiratory distress. She has no wheezes. She exhibits no tenderness.  Abdominal: Soft. Bowel sounds are normal. There is no tenderness. There is no rebound.  Musculoskeletal:  Tenderness to palpation over the left rhomboid  Neurological: She is alert and oriented to person, place, and time.  Skin: Skin is warm and dry.  Psychiatric: She has a normal mood and affect.  Nursing note and vitals reviewed.   ED Course  Procedures (including critical care time) DIAGNOSTIC STUDIES: Oxygen Saturation is 98% on RA, normal by my interpretation.    COORDINATION OF CARE: 12:00 AM Discussed treatment plan with pt at bedside and pt agreed to plan.   Labs Review Labs Reviewed  CBC WITH DIFFERENTIAL/PLATELET - Abnormal; Notable for the following:    RBC 3.83 (*)    Hemoglobin 11.2 (*)    HCT 34.8 (*)    All other components within normal limits  BASIC  METABOLIC PANEL - Abnormal; Notable for the following:    Potassium 3.3 (*)    Glucose, Bld 146 (*)    All other components within normal limits  TROPONIN I  D-DIMER, QUANTITATIVE (NOT AT [facility])    Imaging Review Dg Chest 2 View  03/28/2015  CLINICAL DATA:  Acute onset of upper back pain and left chest pain. Initial encounter. EXAM: CHEST  2 VIEW COMPARISON:  Chest radiograph performed 04/15/2014 FINDINGS: The lungs are well-aerated and clear. There is no evidence of focal opacification, pleural effusion or pneumothorax. The heart is normal in size; the mediastinal contour is within normal limits. No acute osseous abnormalities are seen. Clips are noted overlying the left axilla. IMPRESSION: No acute cardiopulmonary process seen. Electronically Signed   By: Garald Balding M.D.   On: 03/28/2015 00:47   I have personally reviewed and evaluated these images and lab results as part of my medical decision-making.   EKG Interpretation   Date/Time:  Saturday March 27 2015 23:42:39 EDT Ventricular Rate:  76 PR Interval:  152 QRS Duration: 79 QT Interval:  499 QTC Calculation: 561 R Axis:   6 Text Interpretation:  Sinus rhythm Left ventricular hypertrophy Borderline  T abnormalities, diffuse leads Prolonged QT interval Now with prolonged QT  Confirmed by HORTON  MD, COURTNEY ([id number]) on 03/27/2015 11:52:00 PM      MDM   Final diagnoses:  Musculoskeletal pain    Patient presents with back and chest pain. Symptoms have been ongoing for the last day. Back pain reported worse than chest pain. Patient also reports similar symptoms one week ago after lifting heavy boxes. Nontoxic on exam. Pain is reproducible. Vital signs are reassuring. History is inconsistent with ACS. However, patient does have risk factors of hypertension, hyperlipidemia, and diabetes. EKG is largely unchanged from prior and nonischemic. Doubt ACS. Chest x-ray shows no evidence of pneumothorax or pneumonia. D-dimer is  negative for PE. Patient reports improvement of pain with Toradol and morphine. Given reproducibility of pain, suspect chest wall and back muscle pain. Will discharge home with a short course of Flexeril and 600 mg of ibuprofen. Patient was given strict return precautions.  Follow-up with PCP and cardiology.  After history, exam, and medical workup I feel the  patient has been appropriately medically screened and is safe for discharge home. Pertinent diagnoses were discussed with the patient. Patient was given return precautions.  I personally performed the services described in this documentation, which was scribed in my presence. The recorded information has been reviewed and is accurate.   Merryl Hacker, MD 03/28/15 [phone]

## 2015-03-28 ENCOUNTER — Emergency Department (HOSPITAL_COMMUNITY): Payer: Medicaid Other

## 2015-03-28 LAB — BASIC METABOLIC PANEL
Anion gap: 9 (ref 5–15)
BUN: 18 mg/dL (ref 6–20)
CHLORIDE: 105 mmol/L (ref 101–111)
CO2: 25 mmol/L (ref 22–32)
Calcium: 9.3 mg/dL (ref 8.9–10.3)
Creatinine, Ser: 0.95 mg/dL (ref 0.44–1.00)
GLUCOSE: 146 mg/dL — AB (ref 65–99)
POTASSIUM: 3.3 mmol/L — AB (ref 3.5–5.1)
SODIUM: 139 mmol/L (ref 135–145)

## 2015-03-28 LAB — CBC WITH DIFFERENTIAL/PLATELET
BASOS PCT: 0 %
Basophils Absolute: 0 10*3/uL (ref 0.0–0.1)
EOS ABS: 0.1 10*3/uL (ref 0.0–0.7)
Eosinophils Relative: 3 %
HCT: 34.8 % — ABNORMAL LOW (ref 36.0–46.0)
HEMOGLOBIN: 11.2 g/dL — AB (ref 12.0–15.0)
LYMPHS ABS: 0.9 10*3/uL (ref 0.7–4.0)
Lymphocytes Relative: 19 %
MCH: 29.2 pg (ref 26.0–34.0)
MCHC: 32.2 g/dL (ref 30.0–36.0)
MCV: 90.9 fL (ref 78.0–100.0)
Monocytes Absolute: 0.5 10*3/uL (ref 0.1–1.0)
Monocytes Relative: 9 %
NEUTROS PCT: 69 %
Neutro Abs: 3.5 10*3/uL (ref 1.7–7.7)
Platelets: 241 10*3/uL (ref 150–400)
RBC: 3.83 MIL/uL — AB (ref 3.87–5.11)
RDW: 13.8 % (ref 11.5–15.5)
WBC: 5 10*3/uL (ref 4.0–10.5)

## 2015-03-28 LAB — TROPONIN I: Troponin I: <0.03 ng/mL (ref <0.031)

## 2015-03-28 LAB — D-DIMER, QUANTITATIVE: D-Dimer, Quant: 0.27 ug/mL-FEU (ref 0.00–0.48)

## 2015-03-28 MED ORDER — MORPHINE SULFATE (PF) 4 MG/ML IV SOLN
4.0000 mg | Freq: Once | INTRAVENOUS | Status: AC
  Administered 2015-03-28: 4 mg via INTRAVENOUS
  Filled 2015-03-28: qty 1

## 2015-03-28 MED ORDER — KETOROLAC TROMETHAMINE 30 MG/ML IJ SOLN
30.0000 mg | Freq: Once | INTRAMUSCULAR | Status: AC
  Administered 2015-03-28: 30 mg via INTRAVENOUS
  Filled 2015-03-28: qty 1

## 2015-03-28 MED ORDER — CYCLOBENZAPRINE HCL 10 MG PO TABS: 10.0000 mg | ORAL_TABLET | Freq: Two times a day (BID) | ORAL | Status: DC | PRN

## 2015-03-28 MED ORDER — ONDANSETRON HCL 4 MG/2ML IJ SOLN
4.0000 mg | Freq: Once | INTRAMUSCULAR | Status: AC
  Administered 2015-03-28: 4 mg via INTRAVENOUS
  Filled 2015-03-28: qty 2

## 2015-03-28 MED ORDER — IBUPROFEN 600 MG PO TABS: 600.0000 mg | ORAL_TABLET | Freq: Four times a day (QID) | ORAL | Status: DC | PRN

## 2015-03-28 NOTE — ED Notes (Signed)
MD at bedside. 

## 2015-03-28 NOTE — ED Notes (Signed)
Pt to Xray.

## 2015-03-28 NOTE — ED Notes (Signed)
This nurse attempted 2 IV sticks unsuccessfully

## 2015-03-28 NOTE — Discharge Instructions (Signed)
You were seen today for back and chest pain. Your exam is most suggestive of musculoskeletal pain. Your workup is otherwise reassuring. You will be discharged with a short course of muscle relaxant and you should continue ibuprofen at home.  Musculoskeletal Pain Musculoskeletal pain is muscle and boney aches and pains. These pains can occur in any part of the body. Your caregiver may treat you without knowing the cause of the pain. They may treat you if blood or urine tests, X-rays, and other tests were normal.  CAUSES There is often not a definite cause or reason for these pains. These pains may be caused by a type of germ (virus). The discomfort may also come from overuse. Overuse includes working out too hard when your body is not fit. Boney aches also come from weather changes. Bone is sensitive to atmospheric pressure changes. HOME CARE INSTRUCTIONS   Ask when your test results will be ready. Make sure you get your test results.  Only take over-the-counter or prescription medicines for pain, discomfort, or fever as directed by your caregiver. If you were given medications for your condition, do not drive, operate machinery or power tools, or sign legal documents for 24 hours. Do not drink alcohol. Do not take sleeping pills or other medications that may interfere with treatment.  Continue all activities unless the activities cause more pain. When the pain lessens, slowly resume normal activities. Gradually increase the intensity and duration of the activities or exercise.  During periods of severe pain, bed rest may be helpful. Lay or sit in any position that is comfortable.  Putting ice on the injured area.  Put ice in a bag.  Place a towel between your skin and the bag.  Leave the ice on for 15 to 20 minutes, 3 to 4 times a day.  Follow up with your caregiver for continued problems and no reason can be found for the pain. If the pain becomes worse or does not go away, it may be  necessary to repeat tests or do additional testing. Your caregiver may need to look further for a possible cause. SEEK IMMEDIATE MEDICAL CARE IF:  You have pain that is getting worse and is not relieved by medications.  You develop chest pain that is associated with shortness or breath, sweating, feeling sick to your stomach (nauseous), or throw up (vomit).  Your pain becomes localized to the abdomen.  You develop any new symptoms that seem different or that concern you. MAKE SURE YOU:   Understand these instructions.  Will watch your condition.  Will get help right away if you are not doing well or get worse.   This information is not intended to replace advice given to you by your health care provider. Make sure you discuss any questions you have with your health care provider.   Document Released: 05/22/2005 Document Revised: 08/14/2011 Document Reviewed: 01/24/2013 Elsevier Interactive Patient Education 2016 Else[employer] Nonspecific Chest Pain  Chest pain can be caused by many different conditions. There is always a chance that your pain could be related to something serious, such as a heart attack or a blood clot in your lungs. Chest pain can also be caused by conditions that are not life-threatening. If you have chest pain, it is very important to follow up with your health care provider. CAUSES  Chest pain can be caused by:  Heartburn.  Pneumonia or bronchitis.  Anxiety or stress.  Inflammation around your heart (pericarditis) or lung (pleuritis or pleurisy).  A blood clot in your lung.  A collapsed lung (pneumothorax). It can develop suddenly on its own (spontaneous pneumothorax) or from trauma to the chest.  Shingles infection (varicella-zoster virus).  Heart attack.  Damage to the bones, muscles, and cartilage that make up your chest wall. This can include:  Bruised bones due to injury.  Strained muscles or cartilage due to frequent or repeated coughing or  overwork.  Fracture to one or more ribs.  Sore cartilage due to inflammation (costochondritis). RISK FACTORS  Risk factors for chest pain may include:  Activities that increase your risk for trauma or injury to your chest.  Respiratory infections or conditions that cause frequent coughing.  Medical conditions or overeating that can cause heartburn.  Heart disease or family history of heart disease.  Conditions or health behaviors that increase your risk of developing a blood clot.  Having had chicken pox (varicella zoster). SIGNS AND SYMPTOMS Chest pain can feel like:  Burning or tingling on the surface of your chest or deep in your chest.  Crushing, pressure, aching, or squeezing pain.  Dull or sharp pain that is worse when you move, cough, or take a deep breath.  Pain that is also felt in your back, neck, shoulder, or arm, or pain that spreads to any of these areas. Your chest pain may come and go, or it may stay constant. DIAGNOSIS Lab tests or other studies may be needed to find the cause of your pain. Your health care provider may have you take a test called an ambulatory ECG (electrocardiogram). An ECG records your heartbeat patterns at the time the test is performed. You may also have other tests, such as:  Transthoracic echocardiogram (TTE). During echocardiography, sound waves are used to create a picture of all of the heart structures and to look at how blood flows through your heart.  Transesophageal echocardiogram (TEE).This is a more advanced imaging test that obtains images from inside your body. It allows your health care provider to see your heart in finer detail.  Cardiac monitoring. This allows your health care provider to monitor your heart rate and rhythm in real time.  Holter monitor. This is a portable device that records your heartbeat and can help to diagnose abnormal heartbeats. It allows your health care provider to track your heart activity for  several days, if needed.  Stress tests. These can be done through exercise or by taking medicine that makes your heart beat more quickly.  Blood tests.  Imaging tests. TREATMENT  Your treatment depends on what is causing your chest pain. Treatment may include:  Medicines. These may include:  Acid blockers for heartburn.  Anti-inflammatory medicine.  Pain medicine for inflammatory conditions.  Antibiotic medicine, if an infection is present.  Medicines to dissolve blood clots.  Medicines to treat coronary artery disease.  Supportive care for conditions that do not require medicines. This may include:  Resting.  Applying heat or cold packs to injured areas.  Limiting activities until pain decreases. HOME CARE INSTRUCTIONS  If you were prescribed an antibiotic medicine, finish it all even if you start to feel better.  Avoid any activities that bring on chest pain.  Do not use any tobacco products, including cigarettes, chewing tobacco, or electronic cigarettes. If you need help quitting, ask your health care provider.  Do not drink alcohol.  Take medicines only as directed by your health care provider.  Keep all follow-up visits as directed by your health care provider. This is important.  This includes any further testing if your chest pain does not go away.  If heartburn is the cause for your chest pain, you may be told to keep your head raised (elevated) while sleeping. This reduces the chance that acid will go from your stomach into your esophagus.  Make lifestyle changes as directed by your health care provider. These may include:  Getting regular exercise. Ask your health care provider to suggest some activities that are safe for you.  Eating a heart-healthy diet. A registered dietitian can help you to learn healthy eating options.  Maintaining a healthy weight.  Managing diabetes, if necessary.  Reducing stress. SEEK MEDICAL CARE IF:  Your chest pain  does not go away after treatment.  You have a rash with blisters on your chest.  You have a fever. SEEK IMMEDIATE MEDICAL CARE IF:   Your chest pain is worse.  You have an increasing cough, or you cough up blood.  You have severe abdominal pain.  You have severe weakness.  You faint.  You have chills.  You have sudden, unexplained chest discomfort.  You have sudden, unexplained discomfort in your arms, back, neck, or jaw.  You have shortness of breath at any time.  You suddenly start to sweat, or your skin gets clammy.  You feel nauseous or you vomit.  You suddenly feel light-headed or dizzy.  Your heart begins to beat quickly, or it feels like it is skipping beats. These symptoms may represent a serious problem that is an emergency. Do not wait to see if the symptoms will go away. Get medical help right away. Call your local emergency services (911 in the [place] Do not drive yourself to the hospital.   This information is not intended to replace advice given to you by your health care provider. Make sure you discuss any questions you have with your health care provider.   Document Released: 03/01/2005 Document Revised: 06/12/2014 Document Reviewed: 12/26/2013 Elsevier Interactive Patient Education [employer].

## 2015-03-28 NOTE — ED Notes (Signed)
Pt alert,oriented, and ambulatory upon DC. She was advised to follow up with PCP as needed and take prescribed medication.

## 2015-04-09 ENCOUNTER — Encounter: Payer: Self-pay | Admitting: Genetic Counselor

## 2015-04-09 DIAGNOSIS — Z1379 Encounter for other screening for genetic and chromosomal anomalies: Secondary | ICD-10-CM | POA: Insufficient documentation

## 2015-06-11 ENCOUNTER — Telehealth: Payer: Self-pay | Admitting: Oncology

## 2015-06-11 NOTE — Telephone Encounter (Signed)
Called patient and she is aware of her appointment change due to dr Jana Hakim out of the office,we also cancelled her flush as her port has been removed

## 2015-06-19 ENCOUNTER — Emergency Department (INDEPENDENT_AMBULATORY_CARE_PROVIDER_SITE_OTHER)
Admission: EM | Admit: 2015-06-19 | Discharge: 2015-06-19 | Disposition: A | Discharge status: Home / Self Care | Payer: Medicaid Other | Source: Home / Self Care

## 2015-06-19 ENCOUNTER — Encounter (HOSPITAL_COMMUNITY): Payer: Self-pay | Admitting: Emergency Medicine

## 2015-06-19 DIAGNOSIS — J029 Acute pharyngitis, unspecified: Secondary | ICD-10-CM

## 2015-06-19 DIAGNOSIS — J069 Acute upper respiratory infection, unspecified: Secondary | ICD-10-CM

## 2015-06-19 DIAGNOSIS — B349 Viral infection, unspecified: Secondary | ICD-10-CM | POA: Diagnosis not present

## 2015-06-19 MED ORDER — HYDROCODONE-HOMATROPINE 5-1.5 MG/5ML PO SYRP: 5.0000 mL | ORAL_SOLUTION | Freq: Four times a day (QID) | ORAL | Status: DC | PRN

## 2015-06-19 NOTE — ED Notes (Signed)
Here with flu like/ URI sx's that started 2 weeks ago Cough with yellow phlegm, sore throat, headaches,and body aches  Taking OTC Alka-Seltzer

## 2015-06-19 NOTE — Discharge Instructions (Signed)
It was nice seeing you today. I am sorry you don't feel well. This is likely viral illness. At times viral illness can last up to 14 days or more. I will recommend Tylenol as needed for pain and fever. Keep yourself well hydrated and rest at home. I have given script for Hycodan which is a cough medication. Do not take while driving. If symptoms worsens please go to the ED.  Cough, Adult A cough helps to clear your throat and lungs. A cough may last only 2-3 weeks (acute), or it may last longer than 8 weeks (chronic). Many different things can cause a cough. A cough may be a sign of an illness or another medical condition. HOME CARE  Pay attention to any changes in your cough.  Take medicines only as told by your doctor.  If you were prescribed an antibiotic medicine, take it as told by your doctor. Do not stop taking it even if you start to feel better.  Talk with your doctor before you try using a cough medicine.  Drink enough fluid to keep your pee (urine) clear or pale yellow.  If the air is dry, use a cold steam vaporizer or humidifier in your home.  Stay away from things that make you cough at work or at home.  If your cough is worse at night, try using extra pillows to raise your head up higher while you sleep.  Do not smoke, and try not to be around smoke. If you need help quitting, ask your doctor.  Do not have caffeine.  Do not drink alcohol.  Rest as needed. GET HELP IF:  You have new problems (symptoms).  You cough up yellow fluid (pus).  Your cough does not get better after 2-3 weeks, or your cough gets worse.  Medicine does not help your cough and you are not sleeping well.  You have pain that gets worse or pain that is not helped with medicine.  You have a fever.  You are losing weight and you do not know why.  You have night sweats. GET HELP RIGHT AWAY IF:  You cough up blood.  You have trouble breathing.  Your heartbeat is very fast.   This  information is not intended to replace advice given to you by your health care provider. Make sure you discuss any questions you have with your health care provider.   Document Released: 02/02/2011 Document Revised: 02/10/2015 Document Reviewed: 07/29/2014 Elsevier Interactive Patient Education [employer].

## 2015-06-19 NOTE — ED Provider Notes (Signed)
CSN: [id number]     Arrival date & time 06/19/15  1400 History   None    Chief Complaint  Patient presents with  . URI  . Sore Throat   (Consider location/radiation/quality/duration/timing/severity/associated sxs/prior Treatment) Patient is a 45 y.o. female presenting with URI and pharyngitis. The history is provided by the patient. No language interpreter was used.  URI Presenting symptoms: congestion, cough, fatigue and sore throat   Presenting symptoms: no ear pain and no fever   Severity:  Moderate Onset quality:  Gradual Duration:  14 days Timing:  Intermittent Progression:  Worsening Chronicity:  New Relieved by:  Rest Worsened by:  Movement Ineffective treatments: OTC Alka Seltzer. Associated symptoms: arthralgias and headaches   Associated symptoms: no wheezing   Risk factors: sick contacts   Risk factors: no chronic respiratory disease and no diabetes mellitus   Risk factors comment:  Her mother had cold symptoms. Hx of Sacoidosis Sore Throat This is a new problem. The problem occurs constantly. Associated symptoms include headaches. Pertinent negatives include no chest pain and no shortness of breath. Nothing aggravates the symptoms. Nothing relieves the symptoms.    Past Medical History  Diagnosis Date  . Hypertension   . Sarcoidosis ([place])     no problems per patient   . Anemia   . Complication of anesthesia     makes pt. restless and unable to be still  . High cholesterol   . Thyroid cancer ([place]) 2015  . Type II diabetes mellitus ([place])   . Breast cancer ([place]) 2015    left upper outer;  had chemo  . Radiation 06/23/14-08/11/14    Invasive ductal ca o left breast  . SVD (spontaneous vaginal delivery)     x 2  . Heart murmur     never had any problems as adult  . Bronchitis 2014  . Depression     no meds currently  . Anxiety     no meds currently  . GERD (gastroesophageal reflux disease)    Past Surgical History  Procedure Laterality Date  . Tubal  ligation  2001  . Endobronchial ultrasound Bilateral 12/02/2012    Procedure: ENDOBRONCHIAL ULTRASOUND;  Surgeon: Collene Gobble, MD;  Location: [facility];  Service: Cardiopulmonary;  Laterality: Bilateral;  . Portacath placement N/A 10/22/2013    Procedure: INSERTION PORT-A-CATH;  Surgeon: [name], MD;  Location: [facility];  Service: General;  Laterality: N/A;  . Lung biopsy  2014  . Mastectomy w/ sentinel node biopsy Left 04/23/2014    & axillary LND  . Mastectomy w/ sentinel node biopsy Left 04/23/2014    Procedure: LEFT BREAST MASTECTOMY WITH SENTINEL LYMPH NODE BIOPSY AND AXILLARY LYMPH NODE DISECTION;  Surgeon: [name], MD;  Location: [facility];  Service: General;  Laterality: Left;  . Thyroid lobectomy Left 04/23/2014    Procedure: LEFT THYROID LOBECTOMY;  Surgeon: [name], MD;  Location: [facility];  Service: General;  Laterality: Left;  . Breast biopsy Left 2015 X 3    biopsy  . Wisdom tooth extraction    . Leep    . Laparoscopic assisted vaginal hysterectomy N/A 12/09/2014    Procedure: LAPAROSCOPIC ASSISTED VAGINAL HYSTERECTOMY;  Surgeon: Cheri Fowler, MD;  Location: [facility];  Service: Gynecology;  Laterality: N/A;  . Salpingoophorectomy Bilateral 12/09/2014    Procedure: SALPINGO OOPHORECTOMY;  Surgeon: Cheri Fowler, MD;  Location: [facility];  Service: Gynecology;  Laterality: Bilateral;  . Abdominal hysterectomy    . Port-a-cath removal Left 02/03/2015  Procedure: REMOVAL PORT-A-CATH;  Surgeon: [name], MD;  Location: [facility];  Service: General;  Laterality: Left;   Family History  Problem Relation Age of Onset  . Cancer Paternal Aunt     "bone cancer"; deceased 58s  . Cancer Paternal Uncle     stomach cancer; deceased 38s  . Cancer Paternal Aunt     unk. primary; currently 3s  . Prostate cancer Maternal Grandfather 68  . Asthma Mother    Social History  Substance Use Topics  . Smoking status: Current Some Day Smoker -- 0.25 packs/day for 25  years    Types: Cigarettes  . Smokeless tobacco: Never Used     Comment: 04/23/2014 "using vapor; down to 3-4 cigarettes/day now"  . Alcohol Use: 6.0 oz/week    4 Cans of beer, 4 Shots of liquor, 2 Standard drinks or equivalent per week   OB History    No data available     Review of Systems  Constitutional: Positive for fatigue. Negative for fever.  HENT: Positive for congestion and sore throat. Negative for ear pain and trouble swallowing.   Respiratory: Positive for cough. Negative for shortness of breath and wheezing.   Cardiovascular: Negative.  Negative for chest pain.  Genitourinary: Negative.   Musculoskeletal: Positive for arthralgias.  Neurological: Positive for headaches.    Allergies  Nyquil multi-symptom  Home Medications   Prior to Admission medications   Medication Sig Start Date End Date Taking? Authorizing Provider  albuterol (PROVENTIL HFA;VENTOLIN HFA) 108 (90 BASE) MCG/ACT inhaler Inhale 2 puffs into the lungs every 6 (six) hours as needed for wheezing.     Historical Provider, MD  atenolol-chlorthalidone (TENORETIC) 50-25 MG per tablet Take 1 tablet by mouth daily. 12/18/13   Chauncey Cruel, MD  cyclobenzaprine (FLEXERIL) 10 MG tablet Take 1 tablet (10 mg total) by mouth 2 (two) times daily as needed for muscle spasms. 03/28/15   Merryl Hacker, MD  ferrous sulfate 325 (65 FE) MG tablet Take 325 mg by mouth daily with breakfast.     Historical Provider, MD  fluconazole (DIFLUCAN) 150 MG tablet Take 1 tablet (150 mg total) by mouth daily. Take 1 tablet today then repeat in 3 days if necessary for yeast infection Patient not taking: Reported on 03/28/2015 02/18/15   Laurie Panda, NP  gabapentin (NEURONTIN) 300 MG capsule Take 2 capsules (600 mg total) by mouth 2 (two) times daily. 02/18/15   Laurie Panda, NP  ibuprofen (ADVIL,MOTRIN) 600 MG tablet Take 1 tablet (600 mg total) by mouth every 6 (six) hours as needed. 03/28/15   Merryl Hacker, MD   metFORMIN (GLUCOPHAGE) 1000 MG tablet Take 1,000 mg by mouth 2 (two) times daily with a meal.  04/13/14   Historical Provider, MD  Multiple Vitamin (MULTIVITAMIN WITH MINERALS) TABS tablet Take 1 tablet by mouth daily.    Historical Provider, MD  oxyCODONE-acetaminophen (PERCOCET/ROXICET) 5-325 MG tablet Take 1 tablet by mouth every 6 (six) hours as needed for moderate pain or severe pain.    Historical Provider, MD  tamoxifen (NOLVADEX) 20 MG tablet Take 1 tablet (20 mg total) by mouth daily. 02/18/15   Laurie Panda, NP   Meds Ordered and Administered this Visit  Medications - No data to display  BP 140/100 mmHg  Pulse 82  Temp(Src) 98 F (36.7 C) (Oral)  Resp 20  SpO2 97%  LMP 12/07/2014 No data found.   Physical Exam  Constitutional: She is oriented  to person, place, and time. She appears well-developed. No distress.  HENT:  Head: Normocephalic.  Right Ear: Tympanic membrane, external ear and ear canal normal.  Left Ear: External ear and ear canal normal.  Nose: Nose normal.  Mouth/Throat: Uvula is midline, oropharynx is clear and moist and mucous membranes are normal.  Cardiovascular: Normal rate, regular rhythm and normal heart sounds.   No murmur heard. Pulmonary/Chest: Effort normal and breath sounds normal. No respiratory distress. She has no wheezes. She exhibits no tenderness.  Abdominal: Soft. Bowel sounds are normal. She exhibits no distension and no mass. There is no tenderness.  Musculoskeletal: Normal range of motion.  Neurological: She is alert and oriented to person, place, and time.  Nursing note and vitals reviewed.   ED Course  Procedures (including critical care time)  Labs Review Labs Reviewed - No data to display  Imaging Review No results found.   Visual Acuity Review  Right Eye Distance:   Left Eye Distance:   Bilateral Distance:    Right Eye Near:   Left Eye Near:    Bilateral Near:      Filed Vitals:   06/19/15 1454  BP:  140/100  Pulse: 82  Temp: 98 F (36.7 C)  TempSrc: Oral  Resp: 20  SpO2: 97%      MDM  No diagnosis found. Upper respiratory infection  Viral syndrome  Pharyngitis    Patient likely having viral illness. She is hemodynamically stable. Afebrile.Centor criteria of -1, hence strep throat unlikely and rapid strep test not needed at this time. I recommended Tylenol as needed for pain and fever. Rest at home, keep self hydrated. Hycodan prescribed prn cough and congestion. Return precaution discussed. She is advised to return here or go to the ED if symptoms worsens. She verbalized understanding.    Kinnie Feil, MD 06/19/15 [phone]

## 2015-07-20 ENCOUNTER — Other Ambulatory Visit: Payer: Self-pay | Admitting: Oncology

## 2015-07-20 ENCOUNTER — Emergency Department (HOSPITAL_COMMUNITY)
Admission: EM | Admit: 2015-07-20 | Discharge: 2015-07-20 | Disposition: A | Discharge status: Home / Self Care | Payer: Medicaid Other | Source: Home / Self Care | Attending: Family Medicine | Admitting: Family Medicine

## 2015-07-20 ENCOUNTER — Encounter (HOSPITAL_COMMUNITY): Payer: Self-pay | Admitting: *Deleted

## 2015-07-20 ENCOUNTER — Emergency Department (INDEPENDENT_AMBULATORY_CARE_PROVIDER_SITE_OTHER): Payer: Medicaid Other

## 2015-07-20 DIAGNOSIS — M79604 Pain in right leg: Secondary | ICD-10-CM

## 2015-07-20 DIAGNOSIS — M25511 Pain in right shoulder: Secondary | ICD-10-CM

## 2015-07-20 DIAGNOSIS — C50412 Malignant neoplasm of upper-outer quadrant of left female breast: Secondary | ICD-10-CM

## 2015-07-20 DIAGNOSIS — M79601 Pain in right arm: Secondary | ICD-10-CM

## 2015-07-20 DIAGNOSIS — D869 Sarcoidosis, unspecified: Secondary | ICD-10-CM

## 2015-07-20 MED ORDER — OXYCODONE-ACETAMINOPHEN 5-325 MG PO TABS: 1.0000 | ORAL_TABLET | Freq: Four times a day (QID) | ORAL | Status: DC | PRN

## 2015-07-20 NOTE — ED Notes (Signed)
Pt  Reports      Upper   Back  Pain     About  1  Month        Pt  Reports       No  specefic  Injury          denys  Any  Urinary  Symptoms

## 2015-07-20 NOTE — ED Provider Notes (Signed)
CSN: [id number]     Arrival date & time 07/20/15  1330 History   First MD Initiated Contact with Patient 07/20/15 1420     Chief Complaint  Patient presents with  . Back Pain   (Consider location/radiation/quality/duration/timing/severity/associated sxs/prior Treatment) Patient is a 45 y.o. female presenting with back pain. The history is provided by the patient.  Back Pain Location:  Thoracic spine Quality:  Stabbing Radiates to:  R shoulder Pain severity:  Moderate Onset quality:  Gradual Duration:  1 month Progression:  Worsening Chronicity:  New Context: not lifting heavy objects   Context comment:  Stage 3 breast cancer Ineffective treatments:  Muscle relaxants Associated symptoms: weakness   Associated symptoms: no fever, no numbness and no paresthesias   Risk factors: hx of cancer     Past Medical History  Diagnosis Date  . Hypertension   . Sarcoidosis ([place])     no problems per patient   . Anemia   . Complication of anesthesia     makes pt. restless and unable to be still  . High cholesterol   . Thyroid cancer ([place]) 2015  . Type II diabetes mellitus ([place])   . Breast cancer ([place]) 2015    left upper outer;  had chemo  . Radiation 06/23/14-08/11/14    Invasive ductal ca o left breast  . SVD (spontaneous vaginal delivery)     x 2  . Heart murmur     never had any problems as adult  . Bronchitis 2014  . Depression     no meds currently  . Anxiety     no meds currently  . GERD (gastroesophageal reflux disease)    Past Surgical History  Procedure Laterality Date  . Tubal ligation  2001  . Endobronchial ultrasound Bilateral 12/02/2012    Procedure: ENDOBRONCHIAL ULTRASOUND;  Surgeon: Collene Gobble, MD;  Location: [facility];  Service: Cardiopulmonary;  Laterality: Bilateral;  . Portacath placement N/A 10/22/2013    Procedure: INSERTION PORT-A-CATH;  Surgeon: [name], MD;  Location: [facility];  Service: General;  Laterality: N/A;  . Lung biopsy  2014  .  Mastectomy w/ sentinel node biopsy Left 04/23/2014    & axillary LND  . Mastectomy w/ sentinel node biopsy Left 04/23/2014    Procedure: LEFT BREAST MASTECTOMY WITH SENTINEL LYMPH NODE BIOPSY AND AXILLARY LYMPH NODE DISECTION;  Surgeon: [name], MD;  Location: [place] [facility];  Service: General;  Laterality: Left;  . Thyroid lobectomy Left 04/23/2014    Procedure: LEFT THYROID LOBECTOMY;  Surgeon: [name], MD;  Location: [place];  Service: General;  Laterality: Left;  . Breast biopsy Left 2015 X 3    biopsy  . Wisdom tooth extraction    . Leep    . Laparoscopic assisted vaginal hysterectomy N/A 12/09/2014    Procedure: LAPAROSCOPIC ASSISTED VAGINAL HYSTERECTOMY;  Surgeon: Cheri Fowler, MD;  Location: [facility];  Service: Gynecology;  Laterality: N/A;  . Salpingoophorectomy Bilateral 12/09/2014    Procedure: SALPINGO OOPHORECTOMY;  Surgeon: Cheri Fowler, MD;  Location: [facility];  Service: Gynecology;  Laterality: Bilateral;  . Abdominal hysterectomy    . Port-a-cath removal Left 02/03/2015    Procedure: REMOVAL PORT-A-CATH;  Surgeon: [name], MD;  Location: [facility];  Service: General;  Laterality: Left;   Family History  Problem Relation Age of Onset  . Cancer Paternal Aunt     "bone cancer"; deceased 10s  . Cancer Paternal Uncle     stomach cancer; deceased 72s  .  Cancer Paternal Aunt     unk. primary; currently 80s  . Prostate cancer Maternal Grandfather 16  . Asthma Mother    Social History  Substance Use Topics  . Smoking status: Current Some Day Smoker -- 0.25 packs/day for 25 years    Types: Cigarettes  . Smokeless tobacco: Never Used     Comment: 04/23/2014 "using vapor; down to 3-4 cigarettes/day now"  . Alcohol Use: 6.0 oz/week    4 Cans of beer, 4 Shots of liquor, 2 Standard drinks or equivalent per week   OB History    No data available     Review of Systems  Constitutional: Negative for fever.  HENT: Negative.   Respiratory: Negative.     Cardiovascular: Negative.   Gastrointestinal: Negative.   Musculoskeletal: Positive for back pain.  Neurological: Positive for weakness. Negative for numbness and paresthesias.  All other systems reviewed and are negative.   Allergies  Nyquil multi-symptom  Home Medications   Prior to Admission medications   Medication Sig Start Date End Date Taking? Authorizing Provider  albuterol (PROVENTIL HFA;VENTOLIN HFA) 108 (90 BASE) MCG/ACT inhaler Inhale 2 puffs into the lungs every 6 (six) hours as needed for wheezing.     Historical Provider, MD  atenolol-chlorthalidone (TENORETIC) 50-25 MG per tablet Take 1 tablet by mouth daily. 12/18/13   Chauncey Cruel, MD  cyclobenzaprine (FLEXERIL) 10 MG tablet Take 1 tablet (10 mg total) by mouth 2 (two) times daily as needed for muscle spasms. 03/28/15   Merryl Hacker, MD  ferrous sulfate 325 (65 FE) MG tablet Take 325 mg by mouth daily with breakfast.     Historical Provider, MD  fluconazole (DIFLUCAN) 150 MG tablet Take 1 tablet (150 mg total) by mouth daily. Take 1 tablet today then repeat in 3 days if necessary for yeast infection Patient not taking: Reported on 03/28/2015 02/18/15   Laurie Panda, NP  gabapentin (NEURONTIN) 300 MG capsule Take 2 capsules (600 mg total) by mouth 2 (two) times daily. 02/18/15   Laurie Panda, NP  HYDROcodone-homatropine (HYCODAN) 5-1.5 MG/5ML syrup Take 5 mLs by mouth every 6 (six) hours as needed for cough. 06/19/15   Kinnie Feil, MD  ibuprofen (ADVIL,MOTRIN) 600 MG tablet Take 1 tablet (600 mg total) by mouth every 6 (six) hours as needed. 03/28/15   Merryl Hacker, MD  metFORMIN (GLUCOPHAGE) 1000 MG tablet Take 1,000 mg by mouth 2 (two) times daily with a meal.  04/13/14   Historical Provider, MD  Multiple Vitamin (MULTIVITAMIN WITH MINERALS) TABS tablet Take 1 tablet by mouth daily.    Historical Provider, MD  oxyCODONE-acetaminophen (PERCOCET/ROXICET) 5-325 MG tablet Take 1 tablet by mouth every  6 (six) hours as needed for severe pain. 07/20/15   Billy Fischer, MD  tamoxifen (NOLVADEX) 20 MG tablet Take 1 tablet (20 mg total) by mouth daily. 02/18/15   Laurie Panda, NP   Meds Ordered and Administered this Visit  Medications - No data to display  BP 137/95 mmHg  Pulse 81  Temp(Src) 97.7 F (36.5 C) (Oral)  Resp 18  SpO2 99%  LMP 12/07/2014 No data found.   Physical Exam  Constitutional: She is oriented to person, place, and time. She appears well-developed and well-nourished. She appears distressed.  HENT:  Head: Normocephalic.  Mouth/Throat: Oropharynx is clear and moist.  Neck: Normal range of motion. Neck supple.  Cardiovascular: Intact distal pulses.   Musculoskeletal:       Arms:  Lymphadenopathy:    She has no cervical adenopathy.  Neurological: She is alert and oriented to person, place, and time.  Skin: Skin is warm and dry.  Nursing note and vitals reviewed.   ED Course  Procedures (including critical care time)  Labs Review Labs Reviewed - No data to display  Imaging Review Dg Chest 2 View  07/20/2015  CLINICAL DATA:  Right upper back pain EXAM: CHEST  2 VIEW COMPARISON:  03/28/2015 FINDINGS: Cardiac shadow is at the upper limits of normal in size. The lungs are well aerated bilaterally. Postsurgical changes in the left axilla are seen. Fullness in the right peritracheal region and hila is noted consistent with the patient's known history of sarcoidosis and lymphadenopathy. No new focal abnormality is seen. IMPRESSION: Changes consistent with the known history of sarcoidosis. No acute abnormality noted. Electronically Signed   By: Inez Catalina M.D.   On: 07/20/2015 15:08     Visual Acuity Review  Right Eye Distance:   Left Eye Distance:   Bilateral Distance:    Right Eye Near:   Left Eye Near:    Bilateral Near:         MDM   1. Arm pain, anterior, right    Meds ordered this encounter  Medications  . oxyCODONE-acetaminophen  (PERCOCET/ROXICET) 5-325 MG tablet    Sig: Take 1 tablet by mouth every 6 (six) hours as needed for severe pain.    Dispense:  15 tablet    Refill:  0      Billy Fischer, MD 07/20/15 1534

## 2015-07-20 NOTE — Progress Notes (Unsigned)
I was called today by urgent care stating that when he was they are complaining of several weeks worsening right shoulder and right scapular pain. They obtained a chest x-ray which shows stable sarcoidosis.  We'll consider him for an MRI of the right shoulder to evaluate for possible bursitis, rotator cuff tear, but also for metastatic disease from her breast cancer. We will obtain basic lab work also. She will see [place] a couple of days after those tests.

## 2015-07-20 NOTE — Discharge Instructions (Signed)
Expect a call from dr Jana Hakim for mri scan of your shoulder. Use pain medicine as needed.

## 2015-07-21 ENCOUNTER — Encounter (HOSPITAL_COMMUNITY): Payer: Self-pay | Admitting: Emergency Medicine

## 2015-07-21 ENCOUNTER — Emergency Department (HOSPITAL_COMMUNITY)
Admission: EM | Admit: 2015-07-21 | Discharge: 2015-07-22 | Disposition: A | Discharge status: Home / Self Care | Payer: Medicaid Other | Attending: Emergency Medicine | Admitting: Emergency Medicine

## 2015-07-21 ENCOUNTER — Emergency Department (HOSPITAL_COMMUNITY): Payer: Medicaid Other

## 2015-07-21 DIAGNOSIS — Z862 Personal history of diseases of the blood and blood-forming organs and certain disorders involving the immune mechanism: Secondary | ICD-10-CM | POA: Diagnosis not present

## 2015-07-21 DIAGNOSIS — Z85819 Personal history of malignant neoplasm of unspecified site of lip, oral cavity, and pharynx: Secondary | ICD-10-CM | POA: Insufficient documentation

## 2015-07-21 DIAGNOSIS — Z853 Personal history of malignant neoplasm of breast: Secondary | ICD-10-CM | POA: Diagnosis not present

## 2015-07-21 DIAGNOSIS — F419 Anxiety disorder, unspecified: Secondary | ICD-10-CM | POA: Insufficient documentation

## 2015-07-21 DIAGNOSIS — I10 Essential (primary) hypertension: Secondary | ICD-10-CM | POA: Insufficient documentation

## 2015-07-21 DIAGNOSIS — M25511 Pain in right shoulder: Secondary | ICD-10-CM

## 2015-07-21 DIAGNOSIS — E782 Mixed hyperlipidemia: Secondary | ICD-10-CM | POA: Diagnosis not present

## 2015-07-21 DIAGNOSIS — Z79899 Other long term (current) drug therapy: Secondary | ICD-10-CM | POA: Diagnosis not present

## 2015-07-21 DIAGNOSIS — F329 Major depressive disorder, single episode, unspecified: Secondary | ICD-10-CM | POA: Insufficient documentation

## 2015-07-21 DIAGNOSIS — Z7984 Long term (current) use of oral hypoglycemic drugs: Secondary | ICD-10-CM | POA: Insufficient documentation

## 2015-07-21 DIAGNOSIS — C7951 Secondary malignant neoplasm of bone: Secondary | ICD-10-CM

## 2015-07-21 DIAGNOSIS — Z8719 Personal history of other diseases of the digestive system: Secondary | ICD-10-CM | POA: Insufficient documentation

## 2015-07-21 DIAGNOSIS — R011 Cardiac murmur, unspecified: Secondary | ICD-10-CM | POA: Insufficient documentation

## 2015-07-21 DIAGNOSIS — R079 Chest pain, unspecified: Secondary | ICD-10-CM | POA: Diagnosis not present

## 2015-07-21 LAB — CBC
HEMATOCRIT: 34.8 % — AB (ref 36.0–46.0)
Hemoglobin: 11.2 g/dL — ABNORMAL LOW (ref 12.0–15.0)
MCH: 28.7 pg (ref 26.0–34.0)
MCHC: 32.2 g/dL (ref 30.0–36.0)
MCV: 89.2 fL (ref 78.0–100.0)
Platelets: 279 10*3/uL (ref 150–400)
RBC: 3.9 MIL/uL (ref 3.87–5.11)
RDW: 13.3 % (ref 11.5–15.5)
WBC: 5.9 10*3/uL (ref 4.0–10.5)

## 2015-07-21 LAB — BASIC METABOLIC PANEL
Anion gap: 14 (ref 5–15)
BUN: 12 mg/dL (ref 6–20)
CALCIUM: 10.2 mg/dL (ref 8.9–10.3)
CO2: 26 mmol/L (ref 22–32)
CREATININE: 1.15 mg/dL — AB (ref 0.44–1.00)
Chloride: 98 mmol/L — ABNORMAL LOW (ref 101–111)
GFR calc non Af Amer: 56 mL/min — ABNORMAL LOW (ref >60)
Glucose, Bld: 198 mg/dL — ABNORMAL HIGH (ref 65–99)
Potassium: 4.4 mmol/L (ref 3.5–5.1)
SODIUM: 138 mmol/L (ref 135–145)

## 2015-07-21 LAB — I-STAT TROPONIN, ED: TROPONIN I, POC: 0 ng/mL (ref 0.00–0.08)

## 2015-07-21 NOTE — ED Notes (Signed)
Patient here with complaint of right sided chest pain. States onset about 2 days ago. Reports visit to [facility] last night without improvement. States that any movement or deep breath exacerbates pain. Obviously uncomfortable in triage.

## 2015-07-21 NOTE — ED Notes (Signed)
Patient transported to X-ray 

## 2015-07-22 ENCOUNTER — Emergency Department (HOSPITAL_COMMUNITY): Payer: Medicaid Other

## 2015-07-22 ENCOUNTER — Encounter (HOSPITAL_COMMUNITY): Payer: Self-pay | Admitting: Radiology

## 2015-07-22 ENCOUNTER — Telehealth: Payer: Self-pay | Admitting: Oncology

## 2015-07-22 MED ORDER — HYDROMORPHONE HCL 1 MG/ML IJ SOLN
1.0000 mg | Freq: Once | INTRAMUSCULAR | Status: AC
  Administered 2015-07-22: 1 mg via INTRAVENOUS
  Filled 2015-07-22: qty 1

## 2015-07-22 MED ORDER — SODIUM CHLORIDE 0.9 % IV BOLUS (SEPSIS)
1000.0000 mL | Freq: Once | INTRAVENOUS | Status: AC
  Administered 2015-07-22: 1000 mL via INTRAVENOUS

## 2015-07-22 MED ORDER — IOHEXOL 350 MG/ML SOLN
80.0000 mL | Freq: Once | INTRAVENOUS | Status: AC | PRN
  Administered 2015-07-22: 60 mL via INTRAVENOUS

## 2015-07-22 NOTE — ED Notes (Signed)
MD at bedside. 

## 2015-07-22 NOTE — ED Provider Notes (Signed)
CSN: [id number]     Arrival date & time 07/21/15  1914 History  By signing my name below, I, Altamease Oiler, attest that this documentation has been prepared under the direction and in the presence of Everlene Balls, MD. Electronically Signed: Altamease Oiler, ED Scribe. 07/22/2015. 12:29 AM   Chief Complaint  Patient presents with  . Chest Pain   The history is provided by the patient. No language interpreter was used.   Shelly Hall is a 45 y.o. female with history of breast cancer s/p left-sided mastectomy/chemotherapy/radiation, HTN, DM, and sarcoidosis who presents to the Emergency Department complaining of new, sharp, right-sided chest pain with onset last night. The pain radiates to the back and is worse with movement and deep breathing. The Percocet that she was prescribed at Urgent Care yesterday has provided insufficient relief in symptoms.  Pt denies SOB, sweating, vomiting, and LE pain or swelling.  She finished treatment for breast cancer 1 year ago. No history of DVT/PE. No recent travel.   Past Medical History  Diagnosis Date  . Hypertension   . Sarcoidosis ([place])     no problems per patient   . Anemia   . Complication of anesthesia     makes pt. restless and unable to be still  . High cholesterol   . Thyroid cancer ([place]) 2015  . Type II diabetes mellitus ([place])   . Breast cancer ([place]) 2015    left upper outer;  had chemo  . Radiation 06/23/14-08/11/14    Invasive ductal ca o left breast  . SVD (spontaneous vaginal delivery)     x 2  . Heart murmur     never had any problems as adult  . Bronchitis 2014  . Depression     no meds currently  . Anxiety     no meds currently  . GERD (gastroesophageal reflux disease)    Past Surgical History  Procedure Laterality Date  . Tubal ligation  2001  . Endobronchial ultrasound Bilateral 12/02/2012    Procedure: ENDOBRONCHIAL ULTRASOUND;  Surgeon: Collene Gobble, MD;  [facility];  Service: Cardiopulmonary;   Laterality: Bilateral;  . Portacath placement N/A 10/22/2013    Procedure: INSERTION PORT-A-CATH;  Surgeon: [name], MD;  Location: [facility];  Service: General;  Laterality: N/A;  . Lung biopsy  2014  . Mastectomy w/ sentinel node biopsy Left 04/23/2014    & axillary LND  . Mastectomy w/ sentinel node biopsy Left 04/23/2014    Procedure: LEFT BREAST MASTECTOMY WITH SENTINEL LYMPH NODE BIOPSY AND AXILLARY LYMPH NODE DISECTION;  Surgeon: [name], MD;  Location: [facility];  Service: General;  Laterality: Left;  . Thyroid lobectomy Left 04/23/2014    Procedure: LEFT THYROID LOBECTOMY;  Surgeon: [name], MD;  Location: [facility];  Service: General;  Laterality: Left;  . Breast biopsy Left 2015 X 3    biopsy  . Wisdom tooth extraction    . Leep    . Laparoscopic assisted vaginal hysterectomy N/A 12/09/2014    Procedure: LAPAROSCOPIC ASSISTED VAGINAL HYSTERECTOMY;  Surgeon: Cheri Fowler, MD;  Location: [facility];  Service: Gynecology;  Laterality: N/A;  . Salpingoophorectomy Bilateral 12/09/2014    Procedure: SALPINGO OOPHORECTOMY;  Surgeon: Cheri Fowler, MD;  Location: [facility];  Service: Gynecology;  Laterality: Bilateral;  . Abdominal hysterectomy    . Port-a-cath removal Left 02/03/2015    Procedure: REMOVAL PORT-A-CATH;  Surgeon: [name], MD;  Location: [facility];  Service: General;  Laterality: Left;  Family History  Problem Relation Age of Onset  . Cancer Paternal Aunt     "bone cancer"; deceased 86s  . Cancer Paternal Uncle     stomach cancer; deceased 31s  . Cancer Paternal Aunt     unk. primary; currently 79s  . Prostate cancer Maternal Grandfather 66  . Asthma Mother    Social History  Substance Use Topics  . Smoking status: Current Some Day Smoker -- 0.25 packs/day for 25 years    Types: Cigarettes  . Smokeless tobacco: Never Used     Comment: 04/23/2014 "using vapor; down to 3-4 cigarettes/day now"  . Alcohol Use: 6.0 oz/week    4 Cans of beer, 4 Shots  of liquor, 2 Standard drinks or equivalent per week   OB History    No data available     Review of Systems  10 Systems reviewed and all are negative for acute change except as noted in the HPI.   Allergies  Nyquil multi-symptom  Home Medications   Prior to Admission medications   Medication Sig Start Date End Date Taking? Authorizing Provider  albuterol (PROVENTIL HFA;VENTOLIN HFA) 108 (90 BASE) MCG/ACT inhaler Inhale 2 puffs into the lungs every 6 (six) hours as needed for wheezing.     Historical Provider, MD  atenolol-chlorthalidone (TENORETIC) 50-25 MG per tablet Take 1 tablet by mouth daily. 12/18/13   Chauncey Cruel, MD  cyclobenzaprine (FLEXERIL) 10 MG tablet Take 1 tablet (10 mg total) by mouth 2 (two) times daily as needed for muscle spasms. 03/28/15   Merryl Hacker, MD  ferrous sulfate 325 (65 FE) MG tablet Take 325 mg by mouth daily with breakfast.     Historical Provider, MD  fluconazole (DIFLUCAN) 150 MG tablet Take 1 tablet (150 mg total) by mouth daily. Take 1 tablet today then repeat in 3 days if necessary for yeast infection Patient not taking: Reported on 03/28/2015 02/18/15   Laurie Panda, NP  gabapentin (NEURONTIN) 300 MG capsule Take 2 capsules (600 mg total) by mouth 2 (two) times daily. 02/18/15   Laurie Panda, NP  HYDROcodone-homatropine (HYCODAN) 5-1.5 MG/5ML syrup Take 5 mLs by mouth every 6 (six) hours as needed for cough. 06/19/15   Kinnie Feil, MD  ibuprofen (ADVIL,MOTRIN) 600 MG tablet Take 1 tablet (600 mg total) by mouth every 6 (six) hours as needed. 03/28/15   Merryl Hacker, MD  metFORMIN (GLUCOPHAGE) 1000 MG tablet Take 1,000 mg by mouth 2 (two) times daily with a meal.  04/13/14   Historical Provider, MD  Multiple Vitamin (MULTIVITAMIN WITH MINERALS) TABS tablet Take 1 tablet by mouth daily.    Historical Provider, MD  oxyCODONE-acetaminophen (PERCOCET/ROXICET) 5-325 MG tablet Take 1 tablet by mouth every 6 (six) hours as needed  for severe pain. 07/20/15   Billy Fischer, MD  tamoxifen (NOLVADEX) 20 MG tablet Take 1 tablet (20 mg total) by mouth daily. 02/18/15   Laurie Panda, NP   BP 109/81 mmHg  Pulse 88  Temp(Src) 97.4 F (36.3 C) (Oral)  Resp 16  Ht 5\' 3"  (1.6 m)  Wt 185 lb (83.915 kg)  BMI 32.78 kg/m2  SpO2 96%  LMP 12/07/2014 Physical Exam  Constitutional: She is oriented to person, place, and time. She appears well-developed and well-nourished. No distress.  HENT:  Head: Normocephalic and atraumatic.  Nose: Nose normal.  Mouth/Throat: Oropharynx is clear and moist. No oropharyngeal exudate.  Eyes: Conjunctivae and EOM are normal. Pupils are equal, round,  and reactive to light. No scleral icterus.  Neck: Normal range of motion. Neck supple. No JVD present. No tracheal deviation present. No thyromegaly present.  Cardiovascular: Normal rate, regular rhythm and normal heart sounds.  Exam reveals no gallop and no friction rub.   No murmur heard. Pulmonary/Chest: Effort normal and breath sounds normal. No respiratory distress. She has no wheezes. She exhibits no tenderness.  S/p left sided mastectomy   Abdominal: Soft. Bowel sounds are normal. She exhibits no distension and no mass. There is no tenderness. There is no rebound and no guarding.  Musculoskeletal: Normal range of motion. She exhibits no edema or tenderness.  Lymphadenopathy:    She has no cervical adenopathy.  Neurological: She is alert and oriented to person, place, and time. No cranial nerve deficit. She exhibits normal muscle tone.  Skin: Skin is warm and dry. No rash noted. No erythema. No pallor.  Nursing note and vitals reviewed.   ED Course  Procedures (including critical care time) DIAGNOSTIC STUDIES: Oxygen Saturation is 96% on RA,  normal by my interpretation.    COORDINATION OF CARE: 12:19 AM Discussed treatment plan which includes lab work, CXR, EKG, CT angio chest, Dilaudid, and IVF with pt at bedside and pt agreed to  plan.  Labs Review Labs Reviewed  BASIC METABOLIC PANEL - Abnormal; Notable for the following:    Chloride 98 (*)    Glucose, Bld 198 (*)    Creatinine, Ser 1.15 (*)    GFR calc non Af Amer 56 (*)    All other components within normal limits  CBC - Abnormal; Notable for the following:    Hemoglobin 11.2 (*)    HCT 34.8 (*)    All other components within normal limits  I-STAT TROPOININ, ED    Imaging Review Dg Chest 2 View  07/21/2015  CLINICAL DATA:  Right sided chest pain began last night radiating into right side of neck. Hx of pulmonary sarcoidosis, HTN- on meds, type II diabetes mellitus. Breast Cancer 2015 - left upper outer; had chemo. EXAM: CHEST  2 VIEW COMPARISON:  07/20/2015 FINDINGS: Stable mediastinal and hilar adenopathy consistent with the given diagnosis of sarcoidosis. Cardiac silhouette is normal in size. Clear lungs.  No pleural effusion or pneumothorax. There is a permeative type lesion in the distal right clavicle. This is new when compared to a chest radiograph dated 03/28/2015. No other bone lesion. IMPRESSION: 1. No acute cardiopulmonary disease. 2. Mediastinal and hilar adenopathy reflecting sarcoidosis, stable. 3. Permeative type lesion in the distal right clavicle concerning for metastatic breast cancer to bone. Electronically Signed   By: Lajean Manes M.D.   On: 07/21/2015 20:35   Dg Chest 2 View  07/20/2015  CLINICAL DATA:  Right upper back pain EXAM: CHEST  2 VIEW COMPARISON:  03/28/2015 FINDINGS: Cardiac shadow is at the upper limits of normal in size. The lungs are well aerated bilaterally. Postsurgical changes in the left axilla are seen. Fullness in the right peritracheal region and hila is noted consistent with the patient's known history of sarcoidosis and lymphadenopathy. No new focal abnormality is seen. IMPRESSION: Changes consistent with the known history of sarcoidosis. No acute abnormality noted. Electronically Signed   By: Inez Catalina M.D.   On:  07/20/2015 15:08   I have personally reviewed and evaluated these images and lab results as part of my medical decision-making.   EKG Interpretation   Date/Time:  Wednesday July 21 2015 19:20:50 EST Ventricular Rate:  90 PR Interval:  146 QRS Duration: 88 QT Interval:  386 QTC Calculation: 472 R Axis:   -3 Text Interpretation:  Normal sinus rhythm Left ventricular hypertrophy new  TWI V2-V4 Confirmed by Glynn Octave [phone]) on 07/22/2015 12:29:59  AM      MDM   Final diagnoses:  None   patient presents to the emergency department for right-sided back pain radiating to the chest. The pain is pleuritic. She has history of breast cancer and x-ray shows possible metastasis to the right clavicle. She is at risk for pulmonary embolus and, will obtain CT scan of the chest for evaluation. Patient given Dilaudid for pain control. IVf ordered as well.   I personally performed the services described in this documentation, which was scribed in my presence. The recorded information has been reviewed and is accurate.     Everlene Balls, MD 07/22/15 [phone]

## 2015-07-22 NOTE — ED Provider Notes (Signed)
7:29 AM Care transferred to me. Patient's CT scan shows no pulmonary embolism per the radiologist. Has some lymphadenopathy that is probably more sarcoidosis the metastatic disease. However there is thoracic metastasis seen. No obvious fracture per the radiologist. Distal clavicle lesion is not evaluated on the CT scan. Patient updated about findings. Her pain seems to be coming from her right shoulder over her distal clavicle. No obvious fracture seen on x-ray. She is comfortable with discharge and will follow-up with her oncologist as soon as possible as an outpatient.  Results for orders placed or performed during the hospital encounter of [id number]  Basic metabolic panel  Result Value Ref Range   Sodium 138 135 - 145 mmol/L   Potassium 4.4 3.5 - 5.1 mmol/L   Chloride 98 (L) 101 - 111 mmol/L   CO2 26 22 - 32 mmol/L   Glucose, Bld 198 (H) 65 - 99 mg/dL   BUN 12 6 - 20 mg/dL   Creatinine, Ser 1.15 (H) 0.44 - 1.00 mg/dL   Calcium 10.2 8.9 - 10.3 mg/dL   GFR calc non Af Amer 56 (L) >60 mL/min   GFR calc Af Amer >60 >60 mL/min   Anion gap 14 5 - 15  CBC  Result Value Ref Range   WBC 5.9 4.0 - 10.5 K/uL   RBC 3.90 3.87 - 5.11 MIL/uL   Hemoglobin 11.2 (L) 12.0 - 15.0 g/dL   HCT 34.8 (L) 36.0 - 46.0 %   MCV 89.2 78.0 - 100.0 fL   MCH 28.7 26.0 - 34.0 pg   MCHC 32.2 30.0 - 36.0 g/dL   RDW 13.3 11.5 - 15.5 %   Platelets 279 150 - 400 K/uL  I-stat troponin, ED (not at [facility], [facility])  Result Value Ref Range   Troponin i, poc 0.00 0.00 - 0.08 ng/mL   Comment 3           Dg Chest 2 View  07/21/2015  CLINICAL DATA:  Right sided chest pain began last night radiating into right side of neck. Hx of pulmonary sarcoidosis, HTN- on meds, type II diabetes mellitus. Breast Cancer 2015 - left upper outer; had chemo. EXAM: CHEST  2 VIEW COMPARISON:  07/20/2015 FINDINGS: Stable mediastinal and hilar adenopathy consistent with the given diagnosis of sarcoidosis. Cardiac silhouette is normal in size. Clear lungs.   No pleural effusion or pneumothorax. There is a permeative type lesion in the distal right clavicle. This is new when compared to a chest radiograph dated 03/28/2015. No other bone lesion. IMPRESSION: 1. No acute cardiopulmonary disease. 2. Mediastinal and hilar adenopathy reflecting sarcoidosis, stable. 3. Permeative type lesion in the distal right clavicle concerning for metastatic breast cancer to bone. Electronically Signed   By: Lajean Manes M.D.   On: 07/21/2015 20:35   Dg Chest 2 View  07/20/2015  CLINICAL DATA:  Right upper back pain EXAM: CHEST  2 VIEW COMPARISON:  03/28/2015 FINDINGS: Cardiac shadow is at the upper limits of normal in size. The lungs are well aerated bilaterally. Postsurgical changes in the left axilla are seen. Fullness in the right peritracheal region and hila is noted consistent with the patient's known history of sarcoidosis and lymphadenopathy. No new focal abnormality is seen. IMPRESSION: Changes consistent with the known history of sarcoidosis. No acute abnormality noted. Electronically Signed   By: Inez Catalina M.D.   On: 07/20/2015 15:08   Ct Angio Chest Pe W/cm &/or Wo Cm  07/22/2015  CLINICAL DATA:  Right-sided chest pain. Onset  2 days ago. History of left-sided breast cancer. History of sarcoidosis. EXAM: CT ANGIOGRAPHY CHEST WITH CONTRAST TECHNIQUE: Multidetector CT imaging of the chest was performed using the standard protocol during bolus administration of intravenous contrast. Multiplanar CT image reconstructions and MIPs were obtained to evaluate the vascular anatomy. CONTRAST:  109mL OMNIPAQUE IOHEXOL 350 MG/ML SOLN COMPARISON:  Chest radiograph of 07/21/2015. CT of 10/20/2013. PET of 10/20/2013. FINDINGS: Mediastinum/Nodes: The quality of this exam for evaluation of pulmonary embolism is good. No evidence of pulmonary embolism. Left-sided thyroidectomy. No supraclavicular adenopathy. Left mastectomy. Left axillary node dissection. No axillary adenopathy. Aortic  atherosclerosis. Mild cardiomegaly. Right paratracheal adenopathy at 2.3 cm on image 32/series 4. Compare 1.9 cm in 2015. Adenopathy within the azygoesophageal recess measures 3.2 cm today versus 2.0 cm on the prior. Bilateral hilar adenopathy is also slightly progressive. Lungs/Pleura: Trace bilateral pleural fluid or thickening. Bibasilar volume loss with dependent atelectasis. Minimal motion degradation. Similar 4 mm subpleural nodule in the left lower lobe on image 57/ series 5, likely a subpleural lymph node. Upper abdomen: Normal imaged portions of the liver, spleen, stomach. Musculoskeletal: Development of innumerable small lytic lesions throughout the bones, most consistent with osseous metastasis. Examples within the C7 through T2 vertebral bodies on sagittal image 76/series 8. No complicating vertebral body height loss Review of the MIP images confirms the above findings. IMPRESSION: 1.  No evidence of pulmonary embolism. 2. Interval development of multiple osseous lytic lesions, highly suspicious for osseous metastasis. Osseous sarcoid could have this appearance but is felt much less likely. 3. Progression of thoracic adenopathy, likely related to sarcoidosis. Electronically Signed   By: Abigail Miyamoto M.D.   On: 07/22/2015 08:45      Sherwood Gambler, MD 07/22/15 [phone]

## 2015-07-22 NOTE — Telephone Encounter (Signed)
Pt called to get an earlier appt with MD. Pt recently left [facility] hospital and needed to f/u with Dr. Jana Hakim

## 2015-07-23 ENCOUNTER — Telehealth: Payer: Self-pay | Admitting: Oncology

## 2015-07-23 ENCOUNTER — Ambulatory Visit (HOSPITAL_BASED_OUTPATIENT_CLINIC_OR_DEPARTMENT_OTHER): Payer: Medicaid Other | Admitting: Oncology

## 2015-07-23 ENCOUNTER — Other Ambulatory Visit: Payer: Self-pay | Admitting: Nurse Practitioner

## 2015-07-23 ENCOUNTER — Ambulatory Visit (HOSPITAL_BASED_OUTPATIENT_CLINIC_OR_DEPARTMENT_OTHER): Payer: Medicaid Other

## 2015-07-23 VITALS — BP 123/84 | HR 90 | Temp 97.8°F | Resp 20 | Wt 186.9 lb

## 2015-07-23 DIAGNOSIS — C773 Secondary and unspecified malignant neoplasm of axilla and upper limb lymph nodes: Secondary | ICD-10-CM

## 2015-07-23 DIAGNOSIS — Z79810 Long term (current) use of selective estrogen receptor modulators (SERMs): Secondary | ICD-10-CM

## 2015-07-23 DIAGNOSIS — C50412 Malignant neoplasm of upper-outer quadrant of left female breast: Secondary | ICD-10-CM

## 2015-07-23 DIAGNOSIS — Z170 Estrogen receptor positive status [ER+]: Secondary | ICD-10-CM

## 2015-07-23 DIAGNOSIS — D869 Sarcoidosis, unspecified: Secondary | ICD-10-CM

## 2015-07-23 DIAGNOSIS — M25511 Pain in right shoulder: Secondary | ICD-10-CM

## 2015-07-23 DIAGNOSIS — E119 Type 2 diabetes mellitus without complications: Secondary | ICD-10-CM

## 2015-07-23 LAB — COMPREHENSIVE METABOLIC PANEL
ALT: 20 U/L (ref 0–55)
ANION GAP: 12 meq/L — AB (ref 3–11)
AST: 25 U/L (ref 5–34)
Albumin: 3.6 g/dL (ref 3.5–5.0)
Alkaline Phosphatase: 114 U/L (ref 40–150)
BUN: 17 mg/dL (ref 7.0–26.0)
CALCIUM: 10.6 mg/dL — AB (ref 8.4–10.4)
CHLORIDE: 100 meq/L (ref 98–109)
CO2: 25 meq/L (ref 22–29)
CREATININE: 1.2 mg/dL — AB (ref 0.6–1.1)
EGFR: 61 mL/min/{1.73_m2} — ABNORMAL LOW (ref >90)
Glucose: 128 mg/dl (ref 70–140)
POTASSIUM: 4.1 meq/L (ref 3.5–5.1)
Sodium: 137 mEq/L (ref 136–145)
Total Bilirubin: 0.38 mg/dL (ref 0.20–1.20)
Total Protein: 8.6 g/dL — ABNORMAL HIGH (ref 6.4–8.3)

## 2015-07-23 LAB — CBC WITH DIFFERENTIAL/PLATELET
BASO%: 0.1 % (ref 0.0–2.0)
BASOS ABS: 0 10*3/uL (ref 0.0–0.1)
EOS%: 2.2 % (ref 0.0–7.0)
Eosinophils Absolute: 0.2 10*3/uL (ref 0.0–0.5)
HEMATOCRIT: 33.3 % — AB (ref 34.8–46.6)
HGB: 11.1 g/dL — ABNORMAL LOW (ref 11.6–15.9)
LYMPH#: 1 10*3/uL (ref 0.9–3.3)
LYMPH%: 13.2 % — AB (ref 14.0–49.7)
MCH: 29.4 pg (ref 25.1–34.0)
MCHC: 33.3 g/dL (ref 31.5–36.0)
MCV: 88.1 fL (ref 79.5–101.0)
MONO#: 0.6 10*3/uL (ref 0.1–0.9)
MONO%: 7 % (ref 0.0–14.0)
NEUT#: 6.1 10*3/uL (ref 1.5–6.5)
NEUT%: 77.5 % — AB (ref 38.4–76.8)
Platelets: 269 10*3/uL (ref 145–400)
RBC: 3.78 10*6/uL (ref 3.70–5.45)
RDW: 13.1 % (ref 11.2–14.5)
WBC: 7.8 10*3/uL (ref 3.9–10.3)

## 2015-07-23 MED ORDER — OXYCODONE-ACETAMINOPHEN 5-325 MG PO TABS: 1.0000 | ORAL_TABLET | Freq: Four times a day (QID) | ORAL | Status: DC | PRN

## 2015-07-23 MED ORDER — NAPROXEN 500 MG PO TABS: 500.0000 mg | ORAL_TABLET | Freq: Three times a day (TID) | ORAL | Status: DC

## 2015-07-23 NOTE — Progress Notes (Signed)
[place]  [phone] [phone]    ID: Shelly Hall OB: [birth year]  MR#: [id number]  NTI#:[id number]  PCP: Elyn Peers, MD GYN:  [name] SU: [name] OTHER MD: Thea Silversmith  CHIEF COMPLAINT:  Left Breast Cancer, estrogen receptor positive  CURRENT TREATMENT: Tamoxifen  BREAST CANCER HISTORY: From the original intake note:  Grady palpated a mass in her left breast mid-April 45 and immediately brought it to Dr. Benjie Karvonen' attention. She was set up for bilateral screening mammography with tomography at [facility] hospital 09/19/2013. The possible distortion in the left breast and a nearby group of calcifications was felt to warrant further evaluation, and on 09/30/2013 she underwent unilateral left diagnostic mammography and ultrasonography. There was an irregular mass in the outer left breast associated with microcalcifications. 4 cm anterior to this there was another cluster of pleomorphic calcifications spanning 7 mm. He had more anteriorly there was an irregular mass associated with other calcifications. In total the abnormalities in the left breast spent approximately 10 cm, and is segmental distribution. On exam there was a palpable firm mass at the 2:30 o'clock position of the left breast 10 cm from the nipple. There was palpable adenopathy in the inferior left axilla. Ultrasound confirmed an irregular hypoechoic mass in the left breast measuring 4.6 cm. In addition, a 1.4 cm oval slightly irregular mass was noted at the 3:00 position and yet another mass measured 9 mm in the same area. Ultrasound of the left axilla showed a dominant 3.6 cm lymph node.  Biopsy of 2 of the left breast masses and the suspicious left breast lymph node on 10/03/2013 showed ([id number]) an invasive ductal carcinoma, grade 2, estrogen receptor 90% positive with strong staining intensity, progesterone receptor 40% positive but moderate staining intensity, with an  MIB-1 of 20% and no HER-2 amplification. (Because all 3 biopsies were morphologically identical, only one prognostic panel was sent).  On 10/10/2013 the patient underwent bilateral breast MRI. This showed numerous enhancing masses in the left breast, the largest measuring 3.5 cm, and the aggregate measuring 12.7 cm. There were numerous enlarged left axillary lymph nodes, at least 5 of which were enlarged, both at levels 1 and 2. The largest measured 2.3 cm. The right breast was negative.  The patient's subsequent history is as detailed below 45  INTERVAL HISTORY: Susette returns today accompanied by her mother and granddaughter for evaluation of her possibly recurrent breast cancer. She developed significant pain in her right shoulder and went to urgent care. She was given some Percocet, but that did not take care of the pain so she presented to the emergency room the next day, 07/21/2015. As part of the evaluation they obtained a CT angiogram which showed slight progression of her thoracic adenopathy, consistent with her history of sarcoidosis 45 In addition however there were multiple small lytic bone lesions for example C7, T2, consistent with metastatic disease. She was given morphine for pain and is here to consider further workup and treatment  REVIEW OF SYSTEMS: Avory's right shoulder pain is fairly constant. If she holds her right arm flexed with her fist over the shoulder area it does not hurt. Clearly she would benefit from a sledding in that regard. She also has pain in her neck however. This could be referred pain or ache could be primary. Additional problems include insomnia, decreased appetite, no change in taste or bowel movements, forgetfulness and depression. Hot flashes are stable. She tells me her diabetes is under good control.  A detailed review of systems was otherwise noncontributory  PAST MEDICAL HISTORY: Past Medical History  Diagnosis Date  . Hypertension   . Sarcoidosis ([place])      no problems per patient   . Anemia   . Complication of anesthesia     makes pt. restless and unable to be still  . High cholesterol   . Thyroid cancer ([place]) 2015  . Type II diabetes mellitus ([place])   . Breast cancer ([place]) 2015    left upper outer;  had chemo  . Radiation 06/23/14-08/11/14    Invasive ductal ca o left breast  . SVD (spontaneous vaginal delivery)     x 2  . Heart murmur     never had any problems as adult  . Bronchitis 2014  . Depression     no meds currently  . Anxiety     no meds currently  . GERD (gastroesophageal reflux disease)     PAST SURGICAL HISTORY: Past Surgical History  Procedure Laterality Date  . Tubal ligation  2001  . Endobronchial ultrasound Bilateral 12/02/2012    Procedure: ENDOBRONCHIAL ULTRASOUND;  Surgeon: Collene Gobble, MD;  Location: [facility];  Service: Cardiopulmonary;  Laterality: Bilateral;  . Portacath placement N/A 10/22/2013    Procedure: INSERTION PORT-A-CATH;  Surgeon: [name], MD;  Location: [facility];  Service: General;  Laterality: N/A;  . Lung biopsy  2014  . Mastectomy w/ sentinel node biopsy Left 04/23/2014    & axillary LND  . Mastectomy w/ sentinel node biopsy Left 04/23/2014    Procedure: LEFT BREAST MASTECTOMY WITH SENTINEL LYMPH NODE BIOPSY AND AXILLARY LYMPH NODE DISECTION;  Surgeon: [name], MD;  Location: [place];  Service: General;  Laterality: Left;  . Thyroid lobectomy Left 04/23/2014    Procedure: LEFT THYROID LOBECTOMY;  Surgeon: [name], MD;  Location: [place];  Service: General;  Laterality: Left;  . Breast biopsy Left 2015 X 3    biopsy  . Wisdom tooth extraction    . Leep    . Laparoscopic assisted vaginal hysterectomy N/A 12/09/2014    Procedure: LAPAROSCOPIC ASSISTED VAGINAL HYSTERECTOMY;  Surgeon: Cheri Fowler, MD;  Location: [facility];  Service: Gynecology;  Laterality: N/A;  . Salpingoophorectomy Bilateral 12/09/2014    Procedure: SALPINGO OOPHORECTOMY;  Surgeon: Cheri Fowler, MD;  Location:  [facility];  Service: Gynecology;  Laterality: Bilateral;  . Abdominal hysterectomy    . Port-a-cath removal Left 02/03/2015    Procedure: REMOVAL PORT-A-CATH;  Surgeon: [name], MD;  Location: [facility];  Service: General;  Laterality: Left;    FAMILY HISTORY Family History  Problem Relation Age of Onset  . Cancer Paternal Aunt     "bone cancer"; deceased 34s  . Cancer Paternal Uncle     stomach cancer; deceased 45s  . Cancer Paternal Aunt     unk. primary; currently 33s  . Prostate cancer Maternal Grandfather 38  . Asthma Mother   The patient's parents are living, in fair mid to late 42s. The patient had one brother, no sisters. There is no history of breast or ovarian cancer in the family to her knowledge.   GYNECOLOGIC HISTORY:   (Reviewed 11/27/2013)  menarche age 52, first live birth at 89. She is GX P2. She was still having regular periods at the time of the start of her chemotherapy in May 2015.  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 She is single and lives alone, although  her mother is currently staying with her. Her son Jae Bruck lives in [place] and works at [employer]. Son [name] lives in [place] where he works at a [employer]. The patient has 1 grandchild born in Sept 2015, by her son, [name]. She is a Psychologist, forensic.    ADVANCED DIRECTIVES: not in place   HEALTH MAINTENANCE: (Updated 11/27/2013) Social History  Substance Use Topics  . Smoking status: Current Some Day Smoker -- 0.25 packs/day for 25 years    Types: Cigarettes  . Smokeless tobacco: Never Used     Comment: 04/23/2014 "using vapor; down to 3-4 cigarettes/day now"  . Alcohol Use: 6.0 oz/week    4 Cans of beer, 4 Shots of liquor, 2 Standard drinks or equivalent per week     Colonoscopy: Never  PAP: Not on file  Bone density: Not on file  Lipid panel:  Not on file  Allergies  Allergen Reactions  . Nyquil  Multi-Symptom [Pseudoeph-Doxylamine-Dm-Apap] Itching and Other (See Comments)    Skin peels on hands, and feet    Current Outpatient Prescriptions  Medication Sig Dispense Refill  . albuterol (PROVENTIL HFA;VENTOLIN HFA) 108 (90 BASE) MCG/ACT inhaler Inhale 2 puffs into the lungs every 6 (six) hours as needed for wheezing.     Marland Kitchen atenolol-chlorthalidone (TENORETIC) 50-25 MG per tablet Take 1 tablet by mouth daily. 30 tablet 1  . cyclobenzaprine (FLEXERIL) 10 MG tablet Take 1 tablet (10 mg total) by mouth 2 (two) times daily as needed for muscle spasms. 10 tablet 0  . ferrous sulfate 325 (65 FE) MG tablet Take 325 mg by mouth daily with breakfast.     . FLUoxetine (PROZAC) 20 MG tablet Take 20 mg by mouth every morning.    . gabapentin (NEURONTIN) 300 MG capsule Take 2 capsules (600 mg total) by mouth 2 (two) times daily. (Patient taking differently: Take 300-600 mg by mouth 3 (three) times daily. Take 314m in the morning and afternoon and 6060min the evening) 120 capsule 5  . metFORMIN (GLUCOPHAGE) 1000 MG tablet Take 1,000 mg by mouth 2 (two) times daily with a meal.     . Multiple Vitamin (MULTIVITAMIN WITH MINERALS) TABS tablet Take 1 tablet by mouth daily.    . Marland KitchenxyCODONE-acetaminophen (PERCOCET/ROXICET) 5-325 MG tablet Take 1 tablet by mouth every 6 (six) hours as needed for severe pain. 15 tablet 0  . tamoxifen (NOLVADEX) 20 MG tablet Take 1 tablet (20 mg total) by mouth daily. 90 tablet 1   No current facility-administered medications for this visit.    OBJECTIVE: Middle-aged AfSerbiamerican woman protecting her right shoulder Filed Vitals:   07/23/15 0944  BP: 123/84  Pulse: 90  Temp: 97.8 F (36.6 C)  Resp: 20     Body mass index is 33.12 kg/(m^2).    ECOG FS:1 - Symptomatic but completely ambulatory   Sclerae unicteric, pupils round and equal Oropharynx clear and moist-- no thrush or other lesions No cervical or supraclavicular adenopathy Lungs no rales or rhonchi Heart  regular rate and rhythm Abd soft, nontender, positive bowel sounds MSK moderate tenderness over the cervical and upper thoracic spine, no upper extremity lymphedema; range of motion in the right upper extremity severely restricted by shoulder pain  Neuro: nonfocal, well oriented, appropriate affect Breasts: Deferred   LAB RESULTS:   Lab Results  Component Value Date   WBC 5.9 07/21/2015   NEUTROABS 3.5 03/28/2015   HGB 11.2* 07/21/2015   HCT 34.8* 07/21/2015   MCV 89.2  07/21/2015   PLT 279 07/21/2015      Chemistry      Component Value Date/Time   NA 138 07/21/2015 1938   NA 142 02/18/2015 1408   K 4.4 07/21/2015 1938   K 4.3 02/18/2015 1408   CL 98* 07/21/2015 1938   CO2 26 07/21/2015 1938   CO2 29 02/18/2015 1408   BUN 12 07/21/2015 1938   BUN 16.0 02/18/2015 1408   CREATININE 1.15* 07/21/2015 1938   CREATININE 1.0 02/18/2015 1408      Component Value Date/Time   CALCIUM 10.2 07/21/2015 1938   CALCIUM 9.7 02/18/2015 1408   ALKPHOS 64 02/18/2015 1408   ALKPHOS 76 12/02/2014 1509   AST 16 02/18/2015 1408   AST 24 12/02/2014 1509   ALT 21 02/18/2015 1408   ALT 23 12/02/2014 1509   BILITOT <0.20 Repeated and Verified 02/18/2015 1408   BILITOT 0.2* 12/02/2014 1509      STUDIES: Dg Chest 2 View  07/21/2015  CLINICAL DATA:  Right sided chest pain began last night radiating into right side of neck. Hx of pulmonary sarcoidosis, HTN- on meds, type II diabetes mellitus. Breast Cancer 2015 - left upper outer; had chemo. EXAM: CHEST  2 VIEW COMPARISON:  07/20/2015 FINDINGS: Stable mediastinal and hilar adenopathy consistent with the given diagnosis of sarcoidosis. Cardiac silhouette is normal in size. Clear lungs.  No pleural effusion or pneumothorax. There is a permeative type lesion in the distal right clavicle. This is new when compared to a chest radiograph dated 03/28/2015. No other bone lesion. IMPRESSION: 1. No acute cardiopulmonary disease. 2. Mediastinal and hilar  adenopathy reflecting sarcoidosis, stable. 3. Permeative type lesion in the distal right clavicle concerning for metastatic breast cancer to bone. Electronically Signed   By: Lajean Manes M.D.   On: 07/21/2015 20:35   Dg Chest 2 View  07/20/2015  CLINICAL DATA:  Right upper back pain EXAM: CHEST  2 VIEW COMPARISON:  03/28/2015 FINDINGS: Cardiac shadow is at the upper limits of normal in size. The lungs are well aerated bilaterally. Postsurgical changes in the left axilla are seen. Fullness in the right peritracheal region and hila is noted consistent with the patient's known history of sarcoidosis and lymphadenopathy. No new focal abnormality is seen. IMPRESSION: Changes consistent with the known history of sarcoidosis. No acute abnormality noted. Electronically Signed   By: Inez Catalina M.D.   On: 07/20/2015 15:08   Ct Angio Chest Pe W/cm &/or Wo Cm  07/22/2015  CLINICAL DATA:  Right-sided chest pain. Onset 2 days ago. History of left-sided breast cancer. History of sarcoidosis. EXAM: CT ANGIOGRAPHY CHEST WITH CONTRAST TECHNIQUE: Multidetector CT imaging of the chest was performed using the standard protocol during bolus administration of intravenous contrast. Multiplanar CT image reconstructions and MIPs were obtained to evaluate the vascular anatomy. CONTRAST:  7m OMNIPAQUE IOHEXOL 350 MG/ML SOLN COMPARISON:  Chest radiograph of 07/21/2015. CT of 10/20/2013. PET of 10/20/2013. FINDINGS: Mediastinum/Nodes: The quality of this exam for evaluation of pulmonary embolism is good. No evidence of pulmonary embolism. Left-sided thyroidectomy. No supraclavicular adenopathy. Left mastectomy. Left axillary node dissection. No axillary adenopathy. Aortic atherosclerosis. Mild cardiomegaly. Right paratracheal adenopathy at 2.3 cm on image 32/series 4. Compare 1.9 cm in 2015. Adenopathy within the azygoesophageal recess measures 3.2 cm today versus 2.0 cm on the prior. Bilateral hilar adenopathy is also slightly  progressive. Lungs/Pleura: Trace bilateral pleural fluid or thickening. Bibasilar volume loss with dependent atelectasis. Minimal motion degradation. Similar 4 mm subpleural nodule  in the left lower lobe on image 57/ series 5, likely a subpleural lymph node. Upper abdomen: Normal imaged portions of the liver, spleen, stomach. Musculoskeletal: Development of innumerable small lytic lesions throughout the bones, most consistent with osseous metastasis. Examples within the C7 through T2 vertebral bodies on sagittal image 76/series 8. No complicating vertebral body height loss Review of the MIP images confirms the above findings. IMPRESSION: 1.  No evidence of pulmonary embolism. 2. Interval development of multiple osseous lytic lesions, highly suspicious for osseous metastasis. Osseous sarcoid could have this appearance but is felt much less likely. 3. Progression of thoracic adenopathy, likely related to sarcoidosis. Electronically Signed   By: Abigail Miyamoto M.D.   On: 07/22/2015 08:45   CLINICAL DATA: Screening.  EXAM: DIGITAL SCREENING UNILATERAL RIGHT MAMMOGRAM WITH CAD  COMPARISON: Previous exam(s).  ACR Breast Density Category b: There are scattered areas of fibroglandular density.  FINDINGS: There are no findings suspicious for malignancy. There has been a left mastectomy. Images were processed with CAD.  IMPRESSION: No mammographic evidence of malignancy. A result letter of this screening mammogram will be mailed directly to the patient.  RECOMMENDATION: Screening mammogram in one year. (Code:SM-B-01Y)  BI-RADS CATEGORY 1: Negative.   Electronically Signed  By: Altamese Cabal M.D.  On: 11/04/2014 10:44  ASSESSMENT: 45 y.o. BRCA negative [place] woman with a history of sarcoidosis and breast cancer as follows:  (1)  status post left breast and left axillary lymph node biopsy 10/03/2013, both positive for a clinical T2 N2, stage IIIA invasive ductal carcinoma,  grade 2, estrogen and progesterone receptor positive, HER-2 negative, with an MIB-1 of 20%.  (2) Treated with neoadjuvant chemotherapy consisting of doxorubicin and cyclophosphamide in dose dense fashion x4, completed 12/11/2013, followed by paclitaxel weekly x12 (dose reduced by 20% at cycle 5 because of neuropathy concerns) completed 03/19/2014.  (3) status post left mastectomy and left axillary lymph node dissection 04/23/2014 for a pT1c pN2, stage IIIA invasive ductal carcinoma, grade 3, estrogen receptor 99% positive, progesterone receptor 4% positive, with no HER-2 amplification  (3) adjuvant radiation completed 08/11/2014  (4) tamoxifen started 10/10/14  (5) current smoker. Attending quitting cessation therapy with the moviation that she must quit before she has a breast reduction.   (6) genetic testing sent 10/16/2013 did not reveal any deleterious mutation in any of these genes. The genes tested were ATM, BARD1, BRCA1, BRCA2, BRIP1, CDH1, CHEK2, MRE11A, MUTYH, NBN, NF1, PALB2, PTEN, RAD50, RAD51C, RAD51D, and TP53.  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 For now we are going to control her pain with naproxen 3 times daily with food and then if she in addition has pain she will use oxycodone/Tylenol as needed. I wrote her a new prescription for 30 tablets. I also wrote her a prescription for right upper extremity sling  She needs an MRI of the neck and thoracic spine and also a bone scan. She is already scheduled for an MRI of the right shoulder.  Depending on what we find from dose studies, she very likely will need a  biopsy of one of the bone lesions. I don't think we need to biopsy the abnormal adenopathy we  found on the CT scan since that most likely would be due to her sarcoidosis. In any case those are not antiemetic at present.  We are accordingly continuing tamoxifen for now. We certainly have many options if one we are seeing is stage IV breast cancer   She already has an appointment set for [phone], but if necessary we will move that so we can have the biopsy results prior to making definitive treatment plans.  Chauncey Cruel, MD 07/23/2015 10:25 AM

## 2015-07-23 NOTE — Telephone Encounter (Signed)
appt made and avs printed. Radiology will contact pt to schedule MRI

## 2015-07-24 LAB — CANCER ANTIGEN 27.29: CAN 27.29: 217.5 U/mL — AB (ref 0.0–38.6)

## 2015-07-27 ENCOUNTER — Other Ambulatory Visit: Payer: Self-pay | Admitting: Oncology

## 2015-07-27 ENCOUNTER — Other Ambulatory Visit: Payer: Self-pay | Admitting: *Deleted

## 2015-07-27 ENCOUNTER — Telehealth: Payer: Self-pay | Admitting: Oncology

## 2015-07-27 NOTE — Telephone Encounter (Signed)
Spoke with pt to inform and confirm appt change form 3/2 to 3/3 per GM 2/21 pof

## 2015-07-28 ENCOUNTER — Encounter (HOSPITAL_COMMUNITY)
Admission: RE | Admit: 2015-07-28 | Discharge: 2015-07-28 | Disposition: A | Discharge status: Home / Self Care | Payer: Medicaid Other | Source: Ambulatory Visit | Attending: Oncology | Admitting: Oncology

## 2015-07-28 ENCOUNTER — Ambulatory Visit (HOSPITAL_COMMUNITY)
Admission: RE | Admit: 2015-07-28 | Discharge: 2015-07-28 | Disposition: A | Discharge status: Home / Self Care | Payer: Medicaid Other | Source: Ambulatory Visit | Attending: Oncology | Admitting: Oncology

## 2015-07-28 DIAGNOSIS — C7951 Secondary malignant neoplasm of bone: Secondary | ICD-10-CM | POA: Insufficient documentation

## 2015-07-28 DIAGNOSIS — C50412 Malignant neoplasm of upper-outer quadrant of left female breast: Secondary | ICD-10-CM | POA: Insufficient documentation

## 2015-07-28 DIAGNOSIS — D869 Sarcoidosis, unspecified: Secondary | ICD-10-CM | POA: Diagnosis not present

## 2015-07-28 DIAGNOSIS — E119 Type 2 diabetes mellitus without complications: Secondary | ICD-10-CM | POA: Insufficient documentation

## 2015-07-28 MED ORDER — TECHNETIUM TC 99M MEDRONATE IV KIT
26.5000 | PACK | Freq: Once | INTRAVENOUS | Status: AC | PRN
  Administered 2015-07-28: 26.5 via INTRAVENOUS

## 2015-07-29 ENCOUNTER — Other Ambulatory Visit: Payer: Self-pay | Admitting: Oncology

## 2015-07-29 ENCOUNTER — Ambulatory Visit (HOSPITAL_COMMUNITY)
Admission: RE | Admit: 2015-07-29 | Discharge: 2015-07-29 | Disposition: A | Discharge status: Home / Self Care | Payer: Medicaid Other | Source: Ambulatory Visit | Attending: Oncology | Admitting: Oncology

## 2015-07-29 DIAGNOSIS — M25511 Pain in right shoulder: Secondary | ICD-10-CM | POA: Insufficient documentation

## 2015-07-29 DIAGNOSIS — C7951 Secondary malignant neoplasm of bone: Secondary | ICD-10-CM | POA: Diagnosis not present

## 2015-07-29 DIAGNOSIS — D869 Sarcoidosis, unspecified: Secondary | ICD-10-CM | POA: Diagnosis present

## 2015-07-29 DIAGNOSIS — C50412 Malignant neoplasm of upper-outer quadrant of left female breast: Secondary | ICD-10-CM | POA: Insufficient documentation

## 2015-07-29 DIAGNOSIS — M7580 Other shoulder lesions, unspecified shoulder: Secondary | ICD-10-CM | POA: Insufficient documentation

## 2015-07-29 MED ORDER — GADOBENATE DIMEGLUMINE 529 MG/ML IV SOLN
20.0000 mL | Freq: Once | INTRAVENOUS | Status: AC | PRN
  Administered 2015-07-29: 17 mL via INTRAVENOUS

## 2015-08-01 ENCOUNTER — Other Ambulatory Visit: Payer: Self-pay | Admitting: Oncology

## 2015-08-03 ENCOUNTER — Ambulatory Visit (HOSPITAL_COMMUNITY)
Admission: RE | Admit: 2015-08-03 | Discharge: 2015-08-03 | Disposition: A | Discharge status: Home / Self Care | Payer: Medicaid Other | Source: Ambulatory Visit | Attending: Oncology | Admitting: Oncology

## 2015-08-03 ENCOUNTER — Encounter (HOSPITAL_COMMUNITY): Payer: Self-pay

## 2015-08-03 DIAGNOSIS — C50412 Malignant neoplasm of upper-outer quadrant of left female breast: Secondary | ICD-10-CM

## 2015-08-03 DIAGNOSIS — M488X3 Other specified spondylopathies, cervicothoracic region: Secondary | ICD-10-CM | POA: Diagnosis not present

## 2015-08-03 DIAGNOSIS — J432 Centrilobular emphysema: Secondary | ICD-10-CM | POA: Diagnosis not present

## 2015-08-03 DIAGNOSIS — R918 Other nonspecific abnormal finding of lung field: Secondary | ICD-10-CM | POA: Insufficient documentation

## 2015-08-03 DIAGNOSIS — R590 Localized enlarged lymph nodes: Secondary | ICD-10-CM | POA: Diagnosis not present

## 2015-08-03 MED ORDER — IOHEXOL 300 MG/ML  SOLN
75.0000 mL | Freq: Once | INTRAMUSCULAR | Status: AC | PRN
  Administered 2015-08-03: 75 mL via INTRAVENOUS

## 2015-08-05 ENCOUNTER — Ambulatory Visit: Payer: Medicaid Other | Admitting: Nurse Practitioner

## 2015-08-05 ENCOUNTER — Other Ambulatory Visit: Payer: Self-pay | Admitting: *Deleted

## 2015-08-05 ENCOUNTER — Ambulatory Visit: Payer: Medicaid Other | Admitting: Oncology

## 2015-08-05 ENCOUNTER — Ambulatory Visit (HOSPITAL_COMMUNITY): Payer: Medicaid Other

## 2015-08-05 ENCOUNTER — Ambulatory Visit (HOSPITAL_COMMUNITY): Admission: RE | Admit: 2015-08-05 | Payer: Medicaid Other | Source: Ambulatory Visit

## 2015-08-06 ENCOUNTER — Telehealth: Payer: Self-pay | Admitting: Oncology

## 2015-08-06 ENCOUNTER — Ambulatory Visit (HOSPITAL_BASED_OUTPATIENT_CLINIC_OR_DEPARTMENT_OTHER): Payer: Medicaid Other | Admitting: Oncology

## 2015-08-06 VITALS — BP 157/96 | HR 88 | Temp 97.5°F | Resp 18 | Ht 63.0 in | Wt 186.9 lb

## 2015-08-06 DIAGNOSIS — D869 Sarcoidosis, unspecified: Secondary | ICD-10-CM

## 2015-08-06 DIAGNOSIS — C773 Secondary and unspecified malignant neoplasm of axilla and upper limb lymph nodes: Secondary | ICD-10-CM | POA: Diagnosis not present

## 2015-08-06 DIAGNOSIS — C50912 Malignant neoplasm of unspecified site of left female breast: Secondary | ICD-10-CM

## 2015-08-06 DIAGNOSIS — Z170 Estrogen receptor positive status [ER+]: Secondary | ICD-10-CM | POA: Diagnosis not present

## 2015-08-06 DIAGNOSIS — G893 Neoplasm related pain (acute) (chronic): Principal | ICD-10-CM

## 2015-08-06 DIAGNOSIS — C7951 Secondary malignant neoplasm of bone: Secondary | ICD-10-CM | POA: Diagnosis not present

## 2015-08-06 DIAGNOSIS — C50812 Malignant neoplasm of overlapping sites of left female breast: Secondary | ICD-10-CM | POA: Diagnosis present

## 2015-08-06 DIAGNOSIS — E119 Type 2 diabetes mellitus without complications: Secondary | ICD-10-CM

## 2015-08-06 DIAGNOSIS — C50412 Malignant neoplasm of upper-outer quadrant of left female breast: Secondary | ICD-10-CM

## 2015-08-06 MED ORDER — LETROZOLE 2.5 MG PO TABS: 2.5000 mg | ORAL_TABLET | Freq: Every day | ORAL | Status: DC

## 2015-08-06 MED ORDER — OXYCODONE-ACETAMINOPHEN 5-325 MG PO TABS: 1.0000 | ORAL_TABLET | Freq: Four times a day (QID) | ORAL | Status: DC | PRN

## 2015-08-06 NOTE — Progress Notes (Signed)
[place]  [phone] [phone]) [phone]    ID: Shelly Hall OB: [birth year]  MR#: [id number]  HTX#:[id number]  PCP: Elyn Peers, MD GYN:  [name] SU: [name] OTHER MD: Thea Silversmith  CHIEF COMPLAINT:  Left Breast Cancer, estrogen receptor positive  CURRENT TREATMENT: [letrozole, palbociclib, zolendronate]  BREAST CANCER HISTORY: From the original intake note:  [name] palpated a mass in her left breast mid-April 2015 and immediately brought it to Dr. Benjie Karvonen' attention. She was set up for bilateral screening mammography with tomography at [facility] 09/19/2013. The possible distortion in the left breast and a nearby group of calcifications was felt to warrant further evaluation, and on 09/30/2013 she underwent unilateral left diagnostic mammography and ultrasonography. There was an irregular mass in the outer left breast associated with microcalcifications. 4 cm anterior to this there was another cluster of pleomorphic calcifications spanning 7 mm. He had more anteriorly there was an irregular mass associated with other calcifications. In total the abnormalities in the left breast spent approximately 10 cm, and is segmental distribution. On exam there was a palpable firm mass at the 2:30 o'clock position of the left breast 10 cm from the nipple. There was palpable adenopathy in the inferior left axilla. Ultrasound confirmed an irregular hypoechoic mass in the left breast measuring 4.6 cm. In addition, a 1.4 cm oval slightly irregular mass was noted at the 3:00 position and yet another mass measured 9 mm in the same area. Ultrasound of the left axilla showed a dominant 3.6 cm lymph node.  Biopsy of 2 of the left breast masses and the suspicious left breast lymph node on 10/03/2013 showed ([id number]) an invasive ductal carcinoma, grade 2, estrogen receptor 90% positive with strong staining intensity, progesterone receptor 40% positive but  moderate staining intensity, with an MIB-1 of 20% and no HER-2 amplification. (Because all 3 biopsies were morphologically identical, only one prognostic panel was sent).  On 10/10/2013 the patient underwent bilateral breast MRI. This showed numerous enhancing masses in the left breast, the largest measuring 3.5 cm, and the aggregate measuring 12.7 cm. There were numerous enlarged left axillary lymph nodes, at least 5 of which were enlarged, both at levels 1 and 2. The largest measured 2.3 cm. The right breast was negative.  The patient's subsequent history is as detailed below.  INTERVAL HISTORY: [name] returns today for review of her metastatic workup so accompanied by her friend Melody. Recall she had a CT angiogram 07/22/2015 for evaluation of poorly localized pain and this showed apparent lytic lesions. We obtained a bone scan on 07/28/2015, and this showed many spots in the right skull, ribs, right scapula, thoracolumbar spine, pelvis, and distal right femur, consistent with metastatic disease to bone. Shoulder MRI on 07/29/2015 showed a destructive expansile lesion in the distal clavicle with surrounding soft tissue enhancement, measuring 4.7 cm. Again other areas of bony metastatic disease was noted. CT of the chest on 08/03/2015 showed no lung or liver lesions (liver findings were unchanged from 2014 and were not oat hot" on prior PET, consistent with benign hemangiomas.) We also see some mediastinal and bilateral hilar lymphadenopathy which is stable as compared to May 2015 compatible with sarcoidosis. We ordered MRI of the thoracic and lumbar spine but this was denied by Medicaid.  REVIEW OF SYSTEMS: Leanza is using Naprosyn appropriately for her shoulder pain. This takes care of most of it and she still needs the occasional oxycodone and I rewrote those for her today. This  is not constipating her. She also takes gabapentin 3 times a day, which does not make her feel sleepy. She has had a  little vaginal spotting, which is unusual because she status post hysterectomy and bilateral salpingo-oophorectomy. She has a call in to her gynecologist. She feels anxious, but not depressed. She was recently started on Prozac. This of course can affect tamoxifen metabolism. She is not checking her blood sugars. She is having some hot flashes at times. Overall though a detailed review of systems today was stable.  PAST MEDICAL HISTORY: Past Medical History  Diagnosis Date  . Hypertension   . Sarcoidosis ([place])     no problems per patient   . Anemia   . Complication of anesthesia     makes pt. restless and unable to be still  . High cholesterol   . Thyroid cancer ([place]) 2015  . Type II diabetes mellitus ([place])   . Breast cancer ([place]) 2015    left upper outer;  had chemo  . Radiation 06/23/14-08/11/14    Invasive ductal ca o left breast  . SVD (spontaneous vaginal delivery)     x 2  . Heart murmur     never had any problems as adult  . Bronchitis 2014  . Depression     no meds currently  . Anxiety     no meds currently  . GERD (gastroesophageal reflux disease)     PAST SURGICAL HISTORY: Past Surgical History  Procedure Laterality Date  . Tubal ligation  2001  . Endobronchial ultrasound Bilateral 12/02/2012    Procedure: ENDOBRONCHIAL ULTRASOUND;  Surgeon: Collene Gobble, MD;  Location: [facility];  Service: Cardiopulmonary;  Laterality: Bilateral;  . Portacath placement N/A 10/22/2013    Procedure: INSERTION PORT-A-CATH;  Surgeon: [name], MD;  Location: [facility];  Service: General;  Laterality: N/A;  . Lung biopsy  2014  . Mastectomy w/ sentinel node biopsy Left 04/23/2014    & axillary LND  . Mastectomy w/ sentinel node biopsy Left 04/23/2014    Procedure: LEFT BREAST MASTECTOMY WITH SENTINEL LYMPH NODE BIOPSY AND AXILLARY LYMPH NODE DISECTION;  Surgeon: [name], MD;  Location: [facility];  Service: General;  Laterality: Left;  . Thyroid lobectomy Left 04/23/2014     Procedure: LEFT THYROID LOBECTOMY;  Surgeon: [name], MD;  Location: [facility];  Service: General;  Laterality: Left;  . Breast biopsy Left 2015 X 3    biopsy  . Wisdom tooth extraction    . [place]    . Laparoscopic assisted vaginal hysterectomy N/A 12/09/2014    Procedure: LAPAROSCOPIC ASSISTED VAGINAL HYSTERECTOMY;  Surgeon: Cheri Fowler, MD;  Location: [facility];  Service: Gynecology;  Laterality: N/A;  . Salpingoophorectomy Bilateral 12/09/2014    Procedure: SALPINGO OOPHORECTOMY;  Surgeon: Cheri Fowler, MD;  Location: [facility];  Service: Gynecology;  Laterality: Bilateral;  . Abdominal hysterectomy    . Port-a-cath removal Left 02/03/2015    Procedure: REMOVAL PORT-A-CATH;  Surgeon: [name], MD;  Location: [facility];  Service: General;  Laterality: Left;    FAMILY HISTORY Family History  Problem Relation Age of Onset  . Cancer Paternal Aunt     "bone cancer"; deceased 63s  . Cancer Paternal Uncle     stomach cancer; deceased 46s  . Cancer Paternal Aunt     unk. primary; currently 53s  . Prostate cancer Maternal Grandfather 5  . Asthma Mother   The patient's parents are living, in fair mid to late 62s. The  patient had one brother, no sisters. There is no history of breast or ovarian cancer in the family to her knowledge.   GYNECOLOGIC HISTORY:   (Reviewed 11/27/2013)  menarche age 10, first live birth at 78. She is GX P2. She was still having regular periods at the time of the start of her chemotherapy in May 2015.  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 She is single and lives alone, although her mother is currently staying with her. Her son Annissa Andreoni lives in [place] and works at [employer]. Son [name] lives in [place] where he works at a [employer]. The patient has 1 grandchild born in Sept 2015, by her son, [name]. She is a Psychologist, forensic.    ADVANCED DIRECTIVES: not in place   HEALTH  MAINTENANCE: (Updated 11/27/2013) Social History  Substance Use Topics  . Smoking status: Current Some Day Smoker -- 0.25 packs/day for 25 years    Types: Cigarettes  . Smokeless tobacco: Never Used     Comment: 04/23/2014 "using vapor; down to 3-4 cigarettes/day now"  . Alcohol Use: 6.0 oz/week    4 Cans of beer, 4 Shots of liquor, 2 Standard drinks or equivalent per week     Colonoscopy: Never  PAP: Not on file  Bone density: Not on file  Lipid panel:  Not on file  Allergies  Allergen Reactions  . Nyquil Multi-Symptom [Pseudoeph-Doxylamine-Dm-Apap] Itching and Other (See Comments)    Skin peels on hands, and feet    Current Outpatient Prescriptions  Medication Sig Dispense Refill  . albuterol (PROVENTIL HFA;VENTOLIN HFA) 108 (90 BASE) MCG/ACT inhaler Inhale 2 puffs into the lungs every 6 (six) hours as needed for wheezing.     Marland Kitchen atenolol-chlorthalidone (TENORETIC) 50-25 MG per tablet Take 1 tablet by mouth daily. 30 tablet 1  . cyclobenzaprine (FLEXERIL) 10 MG tablet Take 1 tablet (10 mg total) by mouth 2 (two) times daily as needed for muscle spasms. 10 tablet 0  . ferrous sulfate 325 (65 FE) MG tablet Take 325 mg by mouth daily with breakfast.     . FLUoxetine (PROZAC) 20 MG tablet Take 20 mg by mouth every morning.    . gabapentin (NEURONTIN) 300 MG capsule Take 2 capsules (600 mg total) by mouth 2 (two) times daily. (Patient taking differently: Take 300-600 mg by mouth 3 (three) times daily. Take 366m in the morning and afternoon and 6091min the evening) 120 capsule 5  . letrozole (FEMARA) 2.5 MG tablet Take 1 tablet (2.5 mg total) by mouth daily. 90 tablet 4  . metFORMIN (GLUCOPHAGE) 1000 MG tablet Take 1,000 mg by mouth 2 (two) times daily with a meal.     . Multiple Vitamin (MULTIVITAMIN WITH MINERALS) TABS tablet Take 1 tablet by mouth daily.    . naproxen (NAPROSYN) 500 MG tablet Take 1 tablet (500 mg total) by mouth 3 (three) times daily with meals. 90 tablet 3  .  oxyCODONE-acetaminophen (PERCOCET/ROXICET) 5-325 MG tablet Take 1 tablet by mouth every 6 (six) hours as needed for severe pain. 60 tablet 0   No current facility-administered medications for this visit.    OBJECTIVE: Middle-aged AfSerbiamerican woman Who appears stated age Fi77itals:   08/06/15 1423  BP: 157/96  Pulse: 88  Temp: 97.5 F (36.4 C)  Resp: 18     Body mass index is 33.12 kg/(m^2).    ECOG FS:1 - Symptomatic but completely ambulatory   Sclerae unicteric, EOMs  intact Oropharynx clear, dentition in good repair No cervical adenopathy; the right supraclavicular mass is palpable and tender but not erythematous Lungs no rales or rhonchi Heart regular rate and rhythm Abd soft, nontender, positive bowel sounds MSK no focal spinal tenderness, no joint edema Neuro: nonfocal, well oriented, appropriate affect Breasts: The right breast is unremarkable. The left breast is status post mastectomy. There is no evidence of chest wall recurrence. The left axilla is benign.   LAB RESULTS:   Lab Results  Component Value Date   WBC 7.8 07/23/2015   NEUTROABS 6.1 07/23/2015   HGB 11.1* 07/23/2015   HCT 33.3* 07/23/2015   MCV 88.1 07/23/2015   PLT 269 07/23/2015      Chemistry      Component Value Date/Time   NA 137 07/23/2015 1111   NA 138 07/21/2015 1938   K 4.1 07/23/2015 1111   K 4.4 07/21/2015 1938   CL 98* 07/21/2015 1938   CO2 25 07/23/2015 1111   CO2 26 07/21/2015 1938   BUN 17.0 07/23/2015 1111   BUN 12 07/21/2015 1938   CREATININE 1.2* 07/23/2015 1111   CREATININE 1.15* 07/21/2015 1938      Component Value Date/Time   CALCIUM 10.6* 07/23/2015 1111   CALCIUM 10.2 07/21/2015 1938   ALKPHOS 114 07/23/2015 1111   ALKPHOS 76 12/02/2014 1509   AST 25 07/23/2015 1111   AST 24 12/02/2014 1509   ALT 20 07/23/2015 1111   ALT 23 12/02/2014 1509   BILITOT 0.38 07/23/2015 1111   BILITOT 0.2* 12/02/2014 1509      STUDIES: Dg Chest 2 View  07/21/2015   CLINICAL DATA:  Right sided chest pain began last night radiating into right side of neck. Hx of pulmonary sarcoidosis, HTN- on meds, type II diabetes mellitus. Breast Cancer 2015 - left upper outer; had chemo. EXAM: CHEST  2 VIEW COMPARISON:  07/20/2015 FINDINGS: Stable mediastinal and hilar adenopathy consistent with the given diagnosis of sarcoidosis. Cardiac silhouette is normal in size. Clear lungs.  No pleural effusion or pneumothorax. There is a permeative type lesion in the distal right clavicle. This is new when compared to a chest radiograph dated 03/28/2015. No other bone lesion. IMPRESSION: 1. No acute cardiopulmonary disease. 2. Mediastinal and hilar adenopathy reflecting sarcoidosis, stable. 3. Permeative type lesion in the distal right clavicle concerning for metastatic breast cancer to bone. Electronically Signed   By: Lajean Manes M.D.   On: 07/21/2015 20:35   Dg Chest 2 View  07/20/2015  CLINICAL DATA:  Right upper back pain EXAM: CHEST  2 VIEW COMPARISON:  03/28/2015 FINDINGS: Cardiac shadow is at the upper limits of normal in size. The lungs are well aerated bilaterally. Postsurgical changes in the left axilla are seen. Fullness in the right peritracheal region and hila is noted consistent with the patient's known history of sarcoidosis and lymphadenopathy. No new focal abnormality is seen. IMPRESSION: Changes consistent with the known history of sarcoidosis. No acute abnormality noted. Electronically Signed   By: Inez Catalina M.D.   On: 07/20/2015 15:08   Ct Chest W Contrast  08/03/2015  CLINICAL DATA:  History of left breast cancer. History of sarcoidosis per electronic medical records. Status post chemotherapy completed in October 2015 and radiation therapy completed in March 2016, currently on tamoxifen. EXAM: CT CHEST WITH CONTRAST TECHNIQUE: Multidetector CT imaging of the chest was performed during intravenous contrast administration. CONTRAST:  31m OMNIPAQUE IOHEXOL 300 MG/ML   SOLN COMPARISON:  07/22/2015 chest CT. FINDINGS:  Mediastinum/Nodes: Normal heart size. No pericardial fluid/thickening. Great vessels are normal in course and caliber. No central pulmonary emboli. Status post left hemithyroidectomy with normal appearance of the visualized remnant right thyroid lobe. Normal esophagus. Multiple left axillary surgical clips. No axillary adenopathy. Moderate bilateral paratracheal, AP window, subcarinal and bilateral hilar lymphadenopathy is not appreciably changed back to 10/20/2013. For example a 1.6 cm right paratracheal node (series 3/ image 18) measured 1.5 cm on 10/20/2013, not appreciably changed. A 2.1 cm subcarinal node (series 3/ image 27) previously measured 2.1 cm on 10/20/2013, unchanged. A 1.9 cm AP window node (series 3/ image 19) previously measured 1.8 cm, not appreciably changed. A 1.5 cm right infrahilar node (series 3/ image 31) previously measured 1.5 cm, unchanged. A 1.4 cm left infrahilar node (series 3/ image 30) previously measured 1.4 cm, unchanged. No new pathologically enlarged mediastinal or hilar nodes. Lungs/Pleura: No pneumothorax. No residual pleural effusion. Mild centrilobular emphysema. Sharply marginated mild subpleural reticulation in the anterior left upper lobe is in keeping with mild radiation fibrosis. Subpleural 5 mm pulmonary nodule in the posterior left upper lobe (series 6/ image 12) is stable since 10/28/2012 and benign. Right middle lobe 3 mm pulmonary nodule (series 6/ image 36) is stable since 10/28/2012 and benign. Subpleural anterior left lower lobe 3 mm pulmonary nodule associated with the major fissure (series 6/image 29) is stable since 10/28/2012 and benign. There mild patchy ground-glass opacities in the right greater than left upper lobes and superior segment right lower lobe, significantly decreased since 07/22/2015, suggesting a resolving inflammatory process. No acute consolidative airspace disease, new significant pulmonary  nodules or lung masses. Upper abdomen: There is a 1.9 cm heterogeneously enhancing lateral segment left liver lobe mass (series 3/ image 43), stable since 10/28/2012, in keeping with a benign hemangioma. There is a 1.5 cm hypodense left liver dome mass (series 3/ image 41), stable since 10/20/2013 PET-CT, where it was non FDG avid, in keeping with a benign liver lesion. Musculoskeletal: There are innumerable small lytic osseous lesions scattered throughout the lower cervical spine, entire thoracic spine and right lower scapula, which are all new since 10/20/2013 and are worrisome for osseous metastases. IMPRESSION: 1. Innumerable small lytic osseous lesions scattered throughout the visualized cervicothoracic spine and right lower scapula, all new since 10/20/2013, most worrisome for osseous metastases. 2. No additional sites of definite metastatic disease in the chest. Moderate mediastinal and bilateral hilar lymphadenopathy is not appreciably changed back to 10/20/2013 and most compatible with sarcoidosis. 3. Mild patchy ground-glass opacity in the right greater than left upper lungs, significantly decreased since 07/22/2015, suggesting a resolving inflammatory process. 4. Mild centrilobular emphysema. Electronically Signed   By: Ilona Sorrel M.D.   On: 08/03/2015 16:07   Ct Angio Chest Pe W/cm &/or Wo Cm  07/22/2015  CLINICAL DATA:  Right-sided chest pain. Onset 2 days ago. History of left-sided breast cancer. History of sarcoidosis. EXAM: CT ANGIOGRAPHY CHEST WITH CONTRAST TECHNIQUE: Multidetector CT imaging of the chest was performed using the standard protocol during bolus administration of intravenous contrast. Multiplanar CT image reconstructions and MIPs were obtained to evaluate the vascular anatomy. CONTRAST:  59m OMNIPAQUE IOHEXOL 350 MG/ML SOLN COMPARISON:  Chest radiograph of 07/21/2015. CT of 10/20/2013. PET of 10/20/2013. FINDINGS: Mediastinum/Nodes: The quality of this exam for evaluation of  pulmonary embolism is good. No evidence of pulmonary embolism. Left-sided thyroidectomy. No supraclavicular adenopathy. Left mastectomy. Left axillary node dissection. No axillary adenopathy. Aortic atherosclerosis. Mild cardiomegaly. Right paratracheal adenopathy at 2.3  cm on image 32/series 4. Compare 1.9 cm in 2015. Adenopathy within the azygoesophageal recess measures 3.2 cm today versus 2.0 cm on the prior. Bilateral hilar adenopathy is also slightly progressive. Lungs/Pleura: Trace bilateral pleural fluid or thickening. Bibasilar volume loss with dependent atelectasis. Minimal motion degradation. Similar 4 mm subpleural nodule in the left lower lobe on image 57/ series 5, likely a subpleural lymph node. Upper abdomen: Normal imaged portions of the liver, spleen, stomach. Musculoskeletal: Development of innumerable small lytic lesions throughout the bones, most consistent with osseous metastasis. Examples within the C7 through T2 vertebral bodies on sagittal image 76/series 8. No complicating vertebral body height loss Review of the MIP images confirms the above findings. IMPRESSION: 1.  No evidence of pulmonary embolism. 2. Interval development of multiple osseous lytic lesions, highly suspicious for osseous metastasis. Osseous sarcoid could have this appearance but is felt much less likely. 3. Progression of thoracic adenopathy, likely related to sarcoidosis. Electronically Signed   By: Abigail Miyamoto M.D.   On: 07/22/2015 08:45   Nm Bone Scan Whole Body  07/28/2015  CLINICAL DATA:  Breast cancer upper outer quadrant of left breast. Lytic bone lesions seen on recent CT. EXAM: NUCLEAR MEDICINE WHOLE BODY BONE SCAN TECHNIQUE: Whole body anterior and posterior images were obtained approximately 3 hours after intravenous injection of radiopharmaceutical. RADIOPHARMACEUTICALS:  26.5 mCi Technetium-42mMDP IV COMPARISON:  None. FINDINGS: Numerous foci of increased radiopharmaceutical uptake seen within right  skull, bilateral ribs, right scapula thoracolumbar spine, pelvis, and distal right femoral shaft, consistent with diffuse bone metastases. IMPRESSION: Widespread bone metastases involving both axial and appendicular skeleton, as described above. Electronically Signed   By: JEarle GellM.D.   On: 07/28/2015 15:16   Mr Shoulder Right W Wo Contrast  07/29/2015  CLINICAL DATA:  Breast cancer. Bone lesions. Right shoulder pain. Tenderness. EXAM: MRI OF THE RIGHT SHOULDER WITHOUT AND WITH CONTRAST TECHNIQUE: Multiplanar, multisequence MR imaging of the right shoulder was performed before and after the administration of intravenous contrast. CONTRAST:  176mMULTIHANCE GADOBENATE DIMEGLUMINE 529 MG/ML IV SOLN COMPARISON:  07/22/2015 FINDINGS: Despite efforts by the technologist and patient, motion artifact is present on today's exam and could not be eliminated. This reduces exam sensitivity and specificity. Rotator cuff:  Mild infraspinatus and subscapularis tendinopathy. Muscles: Low-level edema in the distal trapezius muscle. Low-level edema proximally in the deltoid muscle. Biceps long head:  Unremarkable Acromioclavicular Joint: 4.7 by 2.2 by 1.6 cm destructive expansile metastatic lesion in the distal clavicle with cortical irregularity and posterior cortical breakthrough, surrounding soft tissue enhancement, and edema in the adjacent trapezius and deltoid muscles. This tumor extends to the distal clavicular articular margin. Mild lateral downsloping of the acromion. Subacromial morphology is type 2 (curved). There is some edema along the coracoclavicular ligament. Glenohumeral Joint: Mild degenerative chondral thinning. Labrum:  Unremarkable Bones: Diffuse osseous metastatic disease. While the destructive distal clavicular metastatic lesion is the most striking, there are numerous scattered metastatic lesions throughout the proximal humerus, scapula, and clavicle. These demonstrate high T2 signal and enhancement.  Small left axillary lymph nodes. There probably metastatic lesions involving the adjacent ribs although the area of the ribs this signal poor. IMPRESSION: 1. Diffuse multifocal osseous metastatic disease in the visualized bony structures. The dominant lesion is a large destructive expansile distal clavicular metastatic lesion with surrounding soft tissue edema and enhancement and cortical destruction. 2. Mild infraspinatus and subscapularis tendinopathy. 3. Mildly laterally downsloping acromion may predispose to impingement. 4. Minimal degenerative chondral thinning  in the glenohumeral joint. Electronically Signed   By: Van Clines M.D.   On: 07/29/2015 08:53    ASSESSMENT: 45 y.o. BRCA negative [place] woman with a history of sarcoidosis and breast cancer as follows:  (1)  status post left breast and left axillary lymph node biopsy 10/03/2013, both positive for a clinical T2 N2, stage IIIA invasive ductal carcinoma, grade 2, estrogen and progesterone receptor positive, HER-2 negative, with an MIB-1 of 20%.  (2) Treated with neoadjuvant chemotherapy consisting of doxorubicin and cyclophosphamide in dose dense fashion x4, completed 12/11/2013, followed by paclitaxel weekly x12 (dose reduced by 20% at cycle 5 because of neuropathy concerns) completed 03/19/2014.  (3) status post left mastectomy and left axillary lymph node dissection 04/23/2014 for a pT1c pN2, stage IIIA invasive ductal carcinoma, grade 3, estrogen receptor 99% positive, progesterone receptor 4% positive, with no HER-2 amplification  (3) adjuvant radiation completed 08/11/2014  (4) tamoxifen started 10/10/14, discontinued March 2017 with evidence of metastatic recurrence  (5) current smoker. Attending quitting cessation therapy with the moviation that she must quit before she has a breast reduction.   (6) genetic testing sent 10/16/2013 did not reveal any deleterious mutation in any of these genes. The genes tested were ATM,  BARD1, BRCA1, BRCA2, BRIP1, CDH1, CHEK2, MRE11A, MUTYH, NBN, NF1, PALB2, PTEN, RAD50, RAD51C, RAD51D, and TP53.  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 We reviewed the images and she can see that there are many "spots" which are consistent with breast cancer metastatic to bone. It is favorable that we don't see evidence of lung or liver involvement. There are 2 liver lesions which are stable and likely are benign hemangiomas.  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  We are not of course completely sure we are dealing with metastatic breast cancer until we do a biopsy. We discussed that at length and in particular she understands that from bone we are unable to obtain HER-2 information. If we could biopsy the soft tissue mass in the right clavicular area that would be optimal. We will schedule that with interventional radiology as soon as possible  With that information not only can we confirm the diagnosis but also we will know whether the tumor is still estrogen receptor positive. That of course will greatly affect treatment options.  Verneda understands stage IV breast cancer is not curable with her current knowledge base, but it is very treatable. She wanted to know how long she would live and I quoted her an average of 5 years, with the explanation that some patients are considerably better and some patients considerably worse.  We will discuss systemic therapy in more detail when she returns to see  me, but in general we prefer to stay away from chemotherapy when we have bone only disease and estrogen receptor positive tumors.  At this time we are stopping tamoxifen. Once we have pathologic confirmation we will start letrozole and palbociclib. We discussed the possible toxicities, side effects and complications of aromatase inhibitors and more importantly of palbociclib. I am  going ahead and placing the prescriptions so that we can get insurance coverage cleared.   We will also start her on zolendronate. She is going to have her teeth cleaned and I wrote a note for her dentist to make sure no extractions were anticipated. We did discuss the possible toxicities and complications of that agent including the rare case of osteonecrosis of the jaw.  Anjeli's pain is moderately well controlled at this time. She likely will benefit from radiation to the right shoulder area, once we have the biopsy done. I would be hesitant to irradiate more marrow bearing areas like the pelvis, where she also has some pain, because we are likely to need chemotherapy at some point in the future and that might compromise her counts.  Mita is understandably distraught by this news, but she has a good understanding of her situation and is in agreement with the initial plan. She will return to see me March 24. She knows to call for any problems that may develop before then.     Chauncey Cruel, MD 08/07/2015 4:41 PM

## 2015-08-06 NOTE — Telephone Encounter (Signed)
appt made and avs printed. Referral to Dr. Pablo Ledger place and there office will contact pt to schedule appt.

## 2015-08-07 DIAGNOSIS — C50912 Malignant neoplasm of unspecified site of left female breast: Secondary | ICD-10-CM | POA: Insufficient documentation

## 2015-08-07 MED ORDER — PALBOCICLIB 125 MG PO CAPS: 125.0000 mg | ORAL_CAPSULE | Freq: Every day | ORAL | Status: DC

## 2015-08-09 MED FILL — IBRANCE 125 MG CAPSULE: 125 | 28 days supply | Qty: 21 | Fill #0

## 2015-08-10 ENCOUNTER — Other Ambulatory Visit: Payer: Self-pay | Admitting: *Deleted

## 2015-08-10 DIAGNOSIS — C50912 Malignant neoplasm of unspecified site of left female breast: Secondary | ICD-10-CM

## 2015-08-10 DIAGNOSIS — C50412 Malignant neoplasm of upper-outer quadrant of left female breast: Secondary | ICD-10-CM

## 2015-08-10 DIAGNOSIS — R590 Localized enlarged lymph nodes: Secondary | ICD-10-CM

## 2015-08-10 DIAGNOSIS — R222 Localized swelling, mass and lump, trunk: Secondary | ICD-10-CM

## 2015-08-11 NOTE — Progress Notes (Signed)
Location of Breast Cancer:Left Upper Outer Metastatic to Bone  Histology per Pathology Report: Diagnosis  04-23-14 1. Lymph node, sentinel, biopsy, left axillary #1 - METASTATIC CARCINOMA IN 1 OF 1 LYMPH NODE (1/1). 2. Lymph node, sentinel, biopsy, left axillary #2 - METASTATIC CARCINOMA IN 1 OF 1 LYMPH NODE (1/1), WITH EXTRACAPSULAR EXTENSION. 3. Lymph node, sentinel, biopsy, left axillary #3 - BENIGN FIBROADIPOSE TISSUE AND PERIPHERAL NERVE. - LYMPH NODAL TISSUE IS NOT IDENTIFIED. - THERE IS NO EVIDENCE OF MALIGNANCY. 4. 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 - DUCTAL CARCINOMA IN SITU WITH CALCIFICATIONS, HIGH GRADE. - LYMPHOVASCULAR INVASION IS IDENTIFIED. - THE SURGICAL RESECTION MARGINS ARE NEGATIVE FOR CARCINOMA. - SEE ONCOLOGY TABLE BELOW. 5. Lymph nodes, regional resection, left axillary contents - METASTATIC CARCINOMA IN 3 OF 9 LYMPH NODES (3/9), WITH EXTRACAPSULAR EXTENSION. 6. Thyroid, lobectomy, left - FOLLICULAR ADENOMA, 0.8 CM. - PARATHYROID TISSUE. - THERE IS NO EVIDENCE OF MALIGNANCY. Microscopic Comment 4. BREAST, INVASIVE TUMOR, WITH LYMPH NODES PRESENT Diagnosis  10-03-13 1. Breast, left, needle core biopsy, mass, 3 o'clock, 3 cm fn - INVASIVE DUCTAL CARCINOMA. - DUCTAL CARCINOMA IN SITU WITH CALCIFICATIONS. - SEE COMMENT. 2. Breast, left, needle core biopsy, mass, 2:30, 10 cm fn - INVASIVE DUCTAL CARCINOMA. - DUCTAL CARCINOMA IN SITU WITH CALCIFICATIONS. - SEE COMMENT. 3. Lymph node, needle/core biopsy, left axilla - INVASIVE DUCTAL CARCINOMA.  Receptor Status: ER(90%), PR (40%), Her2-neu (-),Ki-67(20%)  Did patient present with symptoms (if so, please note symptoms) or was this found on screening mammography?:Mrs. Shelly Hall palpated a mass in her left breast.   Past/Anticipated interventions by surgeon, if any:s/p hysterectomy and bilateral salpingo-oophorectomy 12/09/2014, with benign  pathology           Biopsy,(3) status post left mastectomy and left axillary lymph node dissection 04/23/2014,Left thyroid lobectomy 04/23/14 adenoma, no evidence of malignancy    Past/Anticipated interventions by medical oncology, if any: (1.) Treated with neoadjuvant chemotherapy consisting of doxorubicin and cyclophosphamide in dose dense fashion x4, completed 12/11/2013, (2) followed by paclitaxel weekly x12 (dose reduced by 20% at cycle 5 because of neuropathy concerns) completed 03/19/2014. (3) adjuvant radiation completed 08/11/2014 (4) tamoxifen started 47/7/16, discontinued March 2017 with evidence of metastatic recurrence (5) current smoker. Attending quitting cessation therapy with the moviation that she must quit before she has a breast reduction.  (6) genetic testing sent 10/16/2013 did not reveal any deleterious mutation in any of these genes. The genes tested were ATM, BARD1, BRCA1, BRCA2, BRIP1, CDH1, CHEK2, MRE11A, MUTYH, NBN, NF1, PALB2, PTEN, RAD50, RAD51C, RAD51D, and TP5 METASTATIC DISEASE: documented February 2017, with bone involvement, no lung or liver lesions (9) biopsy pending (10 )We will schedule that with interventional radiology as soon as possible adiation to the right shoulder area, once we have the biopsy done.  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 Lymphedema issues, if any:Yes received PTin 2016  Pain issues, if any:  6/10 Low back and neck taking Oxycodone for pain control  SAFETY ISSUES:  Prior radiation: Completed 08-11-14  Pacemaker/ICD? No  Possible current pregnancy?No  Is the patient on methotrexate? No Current Complaints / other details:  Here to discuss more radiation for back pain clavicle area  Menarche age 29, G2,P2,LMP May 2015, BC 18 years  BP 137/73 mmHg  Pulse 85  Temp(Src) 98.7 F (37.1  C) (Oral)  Resp 16  Ht _0   (1.6 m)  Wt 191 lb 3.2 oz (86.728 kg)  BMI 33.88 kg/m2  SpO2 97%  LMP 12/07/2014 Georgena Spurling, RN 08/11/2045,2:28 PM

## 2015-08-12 ENCOUNTER — Other Ambulatory Visit: Payer: Self-pay

## 2015-08-12 ENCOUNTER — Telehealth: Payer: Self-pay | Admitting: *Deleted

## 2015-08-12 ENCOUNTER — Other Ambulatory Visit: Payer: Self-pay | Admitting: Radiology

## 2015-08-12 NOTE — Telephone Encounter (Signed)
"   I picked up my Ibrance.  When am I to start it?" Consulted Production designer, theatre/television/film.  Instructed to begin today or tomorrow.  Will notify provider and she can expect a call for a lab appointment for blood work within a week or two.  No further questions.

## 2015-08-13 ENCOUNTER — Ambulatory Visit (HOSPITAL_COMMUNITY)
Admission: RE | Admit: 2015-08-13 | Discharge: 2015-08-13 | Disposition: A | Discharge status: Home / Self Care | Payer: Medicaid Other | Source: Ambulatory Visit | Attending: Oncology | Admitting: Oncology

## 2015-08-13 DIAGNOSIS — Z85850 Personal history of malignant neoplasm of thyroid: Secondary | ICD-10-CM | POA: Insufficient documentation

## 2015-08-13 DIAGNOSIS — Z79899 Other long term (current) drug therapy: Secondary | ICD-10-CM | POA: Insufficient documentation

## 2015-08-13 DIAGNOSIS — Z9012 Acquired absence of left breast and nipple: Secondary | ICD-10-CM | POA: Diagnosis not present

## 2015-08-13 DIAGNOSIS — R590 Localized enlarged lymph nodes: Secondary | ICD-10-CM

## 2015-08-13 DIAGNOSIS — C7951 Secondary malignant neoplasm of bone: Secondary | ICD-10-CM | POA: Diagnosis not present

## 2015-08-13 DIAGNOSIS — E7800 Pure hypercholesterolemia, unspecified: Secondary | ICD-10-CM | POA: Diagnosis not present

## 2015-08-13 DIAGNOSIS — D869 Sarcoidosis, unspecified: Secondary | ICD-10-CM | POA: Insufficient documentation

## 2015-08-13 DIAGNOSIS — Z853 Personal history of malignant neoplasm of breast: Secondary | ICD-10-CM | POA: Insufficient documentation

## 2015-08-13 DIAGNOSIS — E119 Type 2 diabetes mellitus without complications: Secondary | ICD-10-CM | POA: Insufficient documentation

## 2015-08-13 DIAGNOSIS — Z7984 Long term (current) use of oral hypoglycemic drugs: Secondary | ICD-10-CM | POA: Diagnosis not present

## 2015-08-13 DIAGNOSIS — K219 Gastro-esophageal reflux disease without esophagitis: Secondary | ICD-10-CM | POA: Insufficient documentation

## 2015-08-13 DIAGNOSIS — M25512 Pain in left shoulder: Secondary | ICD-10-CM | POA: Diagnosis present

## 2015-08-13 DIAGNOSIS — R222 Localized swelling, mass and lump, trunk: Secondary | ICD-10-CM

## 2015-08-13 DIAGNOSIS — Z923 Personal history of irradiation: Secondary | ICD-10-CM | POA: Insufficient documentation

## 2015-08-13 DIAGNOSIS — C50412 Malignant neoplasm of upper-outer quadrant of left female breast: Secondary | ICD-10-CM

## 2015-08-13 DIAGNOSIS — F17210 Nicotine dependence, cigarettes, uncomplicated: Secondary | ICD-10-CM | POA: Diagnosis not present

## 2015-08-13 DIAGNOSIS — I10 Essential (primary) hypertension: Secondary | ICD-10-CM | POA: Diagnosis not present

## 2015-08-13 DIAGNOSIS — C50912 Malignant neoplasm of unspecified site of left female breast: Secondary | ICD-10-CM

## 2015-08-13 LAB — BASIC METABOLIC PANEL
ANION GAP: 13 (ref 5–15)
BUN: 9 mg/dL (ref 6–20)
CHLORIDE: 106 mmol/L (ref 101–111)
CO2: 21 mmol/L — AB (ref 22–32)
Calcium: 10 mg/dL (ref 8.9–10.3)
Creatinine, Ser: 0.98 mg/dL (ref 0.44–1.00)
GFR calc Af Amer: >60 mL/min (ref >60)
GLUCOSE: 112 mg/dL — AB (ref 65–99)
POTASSIUM: 3.7 mmol/L (ref 3.5–5.1)
Sodium: 140 mmol/L (ref 135–145)

## 2015-08-13 LAB — CBC WITH DIFFERENTIAL/PLATELET
BASOS ABS: 0 10*3/uL (ref 0.0–0.1)
Basophils Relative: 0 %
EOS PCT: 2 %
Eosinophils Absolute: 0.1 10*3/uL (ref 0.0–0.7)
HEMATOCRIT: 32.9 % — AB (ref 36.0–46.0)
HEMOGLOBIN: 10.8 g/dL — AB (ref 12.0–15.0)
LYMPHS PCT: 23 %
Lymphs Abs: 1.3 10*3/uL (ref 0.7–4.0)
MCH: 28.5 pg (ref 26.0–34.0)
MCHC: 32.8 g/dL (ref 30.0–36.0)
MCV: 86.8 fL (ref 78.0–100.0)
MONOS PCT: 8 %
Monocytes Absolute: 0.5 10*3/uL (ref 0.1–1.0)
Neutro Abs: 3.9 10*3/uL (ref 1.7–7.7)
Neutrophils Relative %: 67 %
Platelets: UNDETERMINED 10*3/uL (ref 150–400)
RBC: 3.79 MIL/uL — ABNORMAL LOW (ref 3.87–5.11)
RDW: 13.7 % (ref 11.5–15.5)
WBC: 5.8 10*3/uL (ref 4.0–10.5)

## 2015-08-13 LAB — PROTIME-INR
INR: 1.06 (ref 0.00–1.49)
Prothrombin Time: 14 seconds (ref 11.6–15.2)

## 2015-08-13 MED ORDER — FENTANYL CITRATE (PF) 100 MCG/2ML IJ SOLN
INTRAMUSCULAR | Status: AC
  Filled 2015-08-13: qty 2

## 2015-08-13 MED ORDER — MIDAZOLAM HCL 2 MG/2ML IJ SOLN
INTRAMUSCULAR | Status: AC
  Filled 2015-08-13: qty 4

## 2015-08-13 MED ORDER — LIDOCAINE HCL 1 % IJ SOLN
INTRAMUSCULAR | Status: AC
  Filled 2015-08-13: qty 20

## 2015-08-13 MED ORDER — FENTANYL CITRATE (PF) 100 MCG/2ML IJ SOLN
INTRAMUSCULAR | Status: AC | PRN
  Administered 2015-08-13: 50 ug via INTRAVENOUS
  Administered 2015-08-13 (×2): 25 ug via INTRAVENOUS

## 2015-08-13 MED ORDER — SODIUM CHLORIDE 0.9 % IV SOLN: INTRAVENOUS | Status: DC

## 2015-08-13 MED ORDER — MIDAZOLAM HCL 2 MG/2ML IJ SOLN
INTRAMUSCULAR | Status: AC | PRN
  Administered 2015-08-13: 1 mg via INTRAVENOUS
  Administered 2015-08-13 (×2): 0.5 mg via INTRAVENOUS

## 2015-08-13 MED ORDER — SODIUM CHLORIDE 0.9 % IV SOLN
INTRAVENOUS | Status: AC | PRN
  Administered 2015-08-13: 10 mL/h via INTRAVENOUS

## 2015-08-13 MED ORDER — HYDROCODONE-ACETAMINOPHEN 5-325 MG PO TABS: 1.0000 | ORAL_TABLET | ORAL | Status: DC | PRN

## 2015-08-13 NOTE — H&P (Signed)
Chief Complaint: Shoulder pain History of Breast Cancer  Referring Physician(s): Chauncey Cruel  Supervising Physician: Jacqulynn Cadet  History of Present Illness: Shelly Hall is a 45 y.o. female here today for CT guided biopsy  She has a history of breast cancer treated with left mastectomy 04/2014.  She developed some left shoulder pain and clavicle tenderness.  MRI shows diffuse multifocal osseous metastatic disease in the visualized bony structures. The dominant lesion is a large destructive expansile distal clavicular metastatic lesion with surrounding soft tissue edema and enhancement and cortical destruction.  We are asked to perform a CT guided biopsy of the right clavicle lesion today.  She is NPO and she does not take blood thinners.  She denies any recent illness, fever, or chills.  She currently rates her pain 0/10.  Past Medical History  Diagnosis Date  . Hypertension   . Sarcoidosis ([place])     no problems per patient   . Anemia   . Complication of anesthesia     makes pt. restless and unable to be still  . High cholesterol   . Thyroid cancer ([place]) 2015  . Type II diabetes mellitus ([place])   . Breast cancer ([place]) 2015    left upper outer;  had chemo  . Radiation 06/23/14-08/11/14    Invasive ductal ca o left breast  . SVD (spontaneous vaginal delivery)     x 2  . Heart murmur     never had any problems as adult  . Bronchitis 2014  . Depression     no meds currently  . Anxiety     no meds currently  . GERD (gastroesophageal reflux disease)     Past Surgical History  Procedure Laterality Date  . Tubal ligation  2001  . Endobronchial ultrasound Bilateral 12/02/2012    Procedure: ENDOBRONCHIAL ULTRASOUND;  Surgeon: Collene Gobble, MD;  Location: [facility];  Service: Cardiopulmonary;  Laterality: Bilateral;  . Portacath placement N/A 10/22/2013    Procedure: INSERTION PORT-A-CATH;  Surgeon: [name], MD;  Location: [facility];  Service:  General;  Laterality: N/A;  . Lung biopsy  2014  . Mastectomy w/ sentinel node biopsy Left 04/23/2014    & axillary LND  . Mastectomy w/ sentinel node biopsy Left 04/23/2014    Procedure: LEFT BREAST MASTECTOMY WITH SENTINEL LYMPH NODE BIOPSY AND AXILLARY LYMPH NODE DISECTION;  Surgeon: [name], MD;  Location: [facility];  Service: General;  Laterality: Left;  . Thyroid lobectomy Left 04/23/2014    Procedure: LEFT THYROID LOBECTOMY;  Surgeon: [name], MD;  Location: [facility];  Service: General;  Laterality: Left;  . Breast biopsy Left 2015 X 3    biopsy  . Wisdom tooth extraction    . [place]    . Laparoscopic assisted vaginal hysterectomy N/A 12/09/2014    Procedure: LAPAROSCOPIC ASSISTED VAGINAL HYSTERECTOMY;  Surgeon: Cheri Fowler, MD;  Location: [facility];  Service: Gynecology;  Laterality: N/A;  . Salpingoophorectomy Bilateral 12/09/2014    Procedure: SALPINGO OOPHORECTOMY;  Surgeon: Cheri Fowler, MD;  Location: [facility];  Service: Gynecology;  Laterality: Bilateral;  . Abdominal hysterectomy    . Port-a-cath removal Left 02/03/2015    Procedure: REMOVAL PORT-A-CATH;  Surgeon: [name], MD;  Location: [facility];  Service: General;  Laterality: Left;    Allergies: Nyquil multi-symptom  Medications: Prior to Admission medications   Medication Sig Start Date End Date Taking? Authorizing Provider  albuterol (PROVENTIL HFA;VENTOLIN HFA) 108 (90 BASE) MCG/ACT inhaler  Inhale 2 puffs into the lungs every 6 (six) hours as needed for wheezing.     Historical Provider, MD  atenolol-chlorthalidone (TENORETIC) 50-25 MG per tablet Take 1 tablet by mouth daily. 12/18/13   Chauncey Cruel, MD  cyclobenzaprine (FLEXERIL) 10 MG tablet Take 1 tablet (10 mg total) by mouth 2 (two) times daily as needed for muscle spasms. 03/28/15   Merryl Hacker, MD  ferrous sulfate 325 (65 FE) MG tablet Take 325 mg by mouth daily with breakfast.     Historical Provider, MD  FLUoxetine (PROZAC) 20  MG tablet Take 20 mg by mouth every morning.    Historical Provider, MD  gabapentin (NEURONTIN) 300 MG capsule Take 2 capsules (600 mg total) by mouth 2 (two) times daily. Patient taking differently: Take 300-600 mg by mouth 3 (three) times daily. Take 300mg  in the morning and afternoon and 600mg  in the evening 02/18/15   Laurie Panda, NP  letrozole [facility]) 2.5 MG tablet Take 1 tablet (2.5 mg total) by mouth daily. 08/06/15   Chauncey Cruel, MD  metFORMIN (GLUCOPHAGE) 1000 MG tablet Take 1,000 mg by mouth 2 (two) times daily with a meal.  04/13/14   Historical Provider, MD  Multiple Vitamin (MULTIVITAMIN WITH MINERALS) TABS tablet Take 1 tablet by mouth daily.    Historical Provider, MD  naproxen (NAPROSYN) 500 MG tablet Take 1 tablet (500 mg total) by mouth 3 (three) times daily with meals. 07/23/15   Chauncey Cruel, MD  oxyCODONE-acetaminophen (PERCOCET/ROXICET) 5-325 MG tablet Take 1 tablet by mouth every 6 (six) hours as needed for severe pain. 08/06/15   Chauncey Cruel, MD  palbociclib [name]) 125 MG capsule Take 1 capsule (125 mg total) by mouth daily with breakfast. Take whole with food. 08/07/15   Chauncey Cruel, MD     Family History  Problem Relation Age of Onset  . Cancer Paternal Aunt     "bone cancer"; deceased 77s  . Cancer Paternal Uncle     stomach cancer; deceased 55s  . Cancer Paternal Aunt     unk. primary; currently 64s  . Prostate cancer Maternal Grandfather 48  . Asthma Mother     Social History   Social History  . Marital Status: Single    Spouse Name: N/A  . Number of Children: 2  . Years of Education: N/A   Occupational History  . [employer]    Social History Main Topics  . Smoking status: Current Some Day Smoker -- 0.25 packs/day for 25 years    Types: Cigarettes  . Smokeless tobacco: Never Used     Comment: 04/23/2014 "using vapor; down to 3-4 cigarettes/day now"  . Alcohol Use: 6.0 oz/week    4 Cans of beer, 4 Shots of liquor, 2 Standard  drinks or equivalent per week  . Drug Use: No  . Sexual Activity: Not Currently    Birth Control/ Protection: None     Comment: hx chemo   Other Topics Concern  . Not on file   Social History Narrative     Review of Systems: A 12 point ROS discussed  Review of Systems  Constitutional: Negative for fever, chills, activity change, appetite change and fatigue.  HENT: Negative.   Respiratory: Negative for cough, chest tightness, shortness of breath and wheezing.   Cardiovascular: Negative for chest pain.  Gastrointestinal: Negative.   Musculoskeletal: Positive for arthralgias.  Skin: Negative.   Neurological: Negative.   Hematological: Negative.   Psychiatric/Behavioral: Negative.  Vital Signs: BP 134/90 mmHg  Temp(Src) 98.1 F (36.7 C) (Oral)  Resp 12  Ht 5\' 3"  (1.6 m)  Wt 185 lb (83.915 kg)  BMI 32.78 kg/m2  SpO2 96%  LMP 12/07/2014  Physical Exam  Constitutional: She is oriented to person, place, and time. She appears well-developed and well-nourished.  HENT:  Head: Normocephalic and atraumatic.  Eyes: EOM are normal.  Neck: Normal range of motion. Neck supple.  Cardiovascular: Normal rate, regular rhythm and normal heart sounds.   Pulmonary/Chest: Effort normal and breath sounds normal. No respiratory distress. She has no wheezes.  Abdominal: Soft. Bowel sounds are normal. She exhibits no distension. There is no tenderness.  Musculoskeletal:  Tenderness at right clavicle Mild decreased ROM due to pain  Neurological: She is alert and oriented to person, place, and time.  Skin: Skin is warm and dry.  Psychiatric: She has a normal mood and affect. Her behavior is normal. Judgment and thought content normal.  Vitals reviewed.   Mallampati Score:  MD Evaluation Airway: WNL Heart: WNL Abdomen: WNL Chest/ Lungs: WNL ASA  Classification: 2 Mallampati/Airway Score: One  Imaging: Dg Chest 2 View  07/21/2015  CLINICAL DATA:  Right sided chest pain began  last night radiating into right side of neck. Hx of pulmonary sarcoidosis, HTN- on meds, type II diabetes mellitus. Breast Cancer 2015 - left upper outer; had chemo. EXAM: CHEST  2 VIEW COMPARISON:  07/20/2015 FINDINGS: Stable mediastinal and hilar adenopathy consistent with the given diagnosis of sarcoidosis. Cardiac silhouette is normal in size. Clear lungs.  No pleural effusion or pneumothorax. There is a permeative type lesion in the distal right clavicle. This is new when compared to a chest radiograph dated 03/28/2015. No other bone lesion. IMPRESSION: 1. No acute cardiopulmonary disease. 2. Mediastinal and hilar adenopathy reflecting sarcoidosis, stable. 3. Permeative type lesion in the distal right clavicle concerning for metastatic breast cancer to bone. Electronically Signed   By: Lajean Manes M.D.   On: 07/21/2015 20:35   Dg Chest 2 View  07/20/2015  CLINICAL DATA:  Right upper back pain EXAM: CHEST  2 VIEW COMPARISON:  03/28/2015 FINDINGS: Cardiac shadow is at the upper limits of normal in size. The lungs are well aerated bilaterally. Postsurgical changes in the left axilla are seen. Fullness in the right peritracheal region and hila is noted consistent with the patient's known history of sarcoidosis and lymphadenopathy. No new focal abnormality is seen. IMPRESSION: Changes consistent with the known history of sarcoidosis. No acute abnormality noted. Electronically Signed   By: Inez Catalina M.D.   On: 07/20/2015 15:08   Ct Chest W Contrast  08/03/2015  CLINICAL DATA:  History of left breast cancer. History of sarcoidosis per electronic medical records. Status post chemotherapy completed in October 2015 and radiation therapy completed in March 2016, currently on tamoxifen. EXAM: CT CHEST WITH CONTRAST TECHNIQUE: Multidetector CT imaging of the chest was performed during intravenous contrast administration. CONTRAST:  27mL OMNIPAQUE IOHEXOL 300 MG/ML  SOLN COMPARISON:  07/22/2015 chest CT. FINDINGS:  Mediastinum/Nodes: Normal heart size. No pericardial fluid/thickening. Great vessels are normal in course and caliber. No central pulmonary emboli. Status post left hemithyroidectomy with normal appearance of the visualized remnant right thyroid lobe. Normal esophagus. Multiple left axillary surgical clips. No axillary adenopathy. Moderate bilateral paratracheal, AP window, subcarinal and bilateral hilar lymphadenopathy is not appreciably changed back to 10/20/2013. For example a 1.6 cm right paratracheal node (series 3/ image 18) measured 1.5 cm on 10/20/2013, not appreciably  changed. A 2.1 cm subcarinal node (series 3/ image 27) previously measured 2.1 cm on 10/20/2013, unchanged. A 1.9 cm AP window node (series 3/ image 19) previously measured 1.8 cm, not appreciably changed. A 1.5 cm right infrahilar node (series 3/ image 31) previously measured 1.5 cm, unchanged. A 1.4 cm left infrahilar node (series 3/ image 30) previously measured 1.4 cm, unchanged. No new pathologically enlarged mediastinal or hilar nodes. Lungs/Pleura: No pneumothorax. No residual pleural effusion. Mild centrilobular emphysema. Sharply marginated mild subpleural reticulation in the anterior left upper lobe is in keeping with mild radiation fibrosis. Subpleural 5 mm pulmonary nodule in the posterior left upper lobe (series 6/ image 12) is stable since 10/28/2012 and benign. Right middle lobe 3 mm pulmonary nodule (series 6/ image 36) is stable since 10/28/2012 and benign. Subpleural anterior left lower lobe 3 mm pulmonary nodule associated with the major fissure (series 6/image 29) is stable since 10/28/2012 and benign. There mild patchy ground-glass opacities in the right greater than left upper lobes and superior segment right lower lobe, significantly decreased since 07/22/2015, suggesting a resolving inflammatory process. No acute consolidative airspace disease, new significant pulmonary nodules or lung masses. Upper abdomen: There is a  1.9 cm heterogeneously enhancing lateral segment left liver lobe mass (series 3/ image 43), stable since 10/28/2012, in keeping with a benign hemangioma. There is a 1.5 cm hypodense left liver dome mass (series 3/ image 41), stable since 10/20/2013 PET-CT, where it was non FDG avid, in keeping with a benign liver lesion. Musculoskeletal: There are innumerable small lytic osseous lesions scattered throughout the lower cervical spine, entire thoracic spine and right lower scapula, which are all new since 10/20/2013 and are worrisome for osseous metastases. IMPRESSION: 1. Innumerable small lytic osseous lesions scattered throughout the visualized cervicothoracic spine and right lower scapula, all new since 10/20/2013, most worrisome for osseous metastases. 2. No additional sites of definite metastatic disease in the chest. Moderate mediastinal and bilateral hilar lymphadenopathy is not appreciably changed back to 10/20/2013 and most compatible with sarcoidosis. 3. Mild patchy ground-glass opacity in the right greater than left upper lungs, significantly decreased since 07/22/2015, suggesting a resolving inflammatory process. 4. Mild centrilobular emphysema. Electronically Signed   By: Ilona Sorrel M.D.   On: 08/03/2015 16:07   Ct Angio Chest Pe W/cm &/or Wo Cm  07/22/2015  CLINICAL DATA:  Right-sided chest pain. Onset 2 days ago. History of left-sided breast cancer. History of sarcoidosis. EXAM: CT ANGIOGRAPHY CHEST WITH CONTRAST TECHNIQUE: Multidetector CT imaging of the chest was performed using the standard protocol during bolus administration of intravenous contrast. Multiplanar CT image reconstructions and MIPs were obtained to evaluate the vascular anatomy. CONTRAST:  8mL OMNIPAQUE IOHEXOL 350 MG/ML SOLN COMPARISON:  Chest radiograph of 07/21/2015. CT of 10/20/2013. PET of 10/20/2013. FINDINGS: Mediastinum/Nodes: The quality of this exam for evaluation of pulmonary embolism is good. No evidence of pulmonary  embolism. Left-sided thyroidectomy. No supraclavicular adenopathy. Left mastectomy. Left axillary node dissection. No axillary adenopathy. Aortic atherosclerosis. Mild cardiomegaly. Right paratracheal adenopathy at 2.3 cm on image 32/series 4. Compare 1.9 cm in 2015. Adenopathy within the azygoesophageal recess measures 3.2 cm today versus 2.0 cm on the prior. Bilateral hilar adenopathy is also slightly progressive. Lungs/Pleura: Trace bilateral pleural fluid or thickening. Bibasilar volume loss with dependent atelectasis. Minimal motion degradation. Similar 4 mm subpleural nodule in the left lower lobe on image 57/ series 5, likely a subpleural lymph node. Upper abdomen: Normal imaged portions of the liver, spleen, stomach. Musculoskeletal:  Development of innumerable small lytic lesions throughout the bones, most consistent with osseous metastasis. Examples within the C7 through T2 vertebral bodies on sagittal image 76/series 8. No complicating vertebral body height loss Review of the MIP images confirms the above findings. IMPRESSION: 1.  No evidence of pulmonary embolism. 2. Interval development of multiple osseous lytic lesions, highly suspicious for osseous metastasis. Osseous sarcoid could have this appearance but is felt much less likely. 3. Progression of thoracic adenopathy, likely related to sarcoidosis. Electronically Signed   By: Abigail Miyamoto M.D.   On: 07/22/2015 08:45   Nm Bone Scan Whole Body  07/28/2015  CLINICAL DATA:  Breast cancer upper outer quadrant of left breast. Lytic bone lesions seen on recent CT. EXAM: NUCLEAR MEDICINE WHOLE BODY BONE SCAN TECHNIQUE: Whole body anterior and posterior images were obtained approximately 3 hours after intravenous injection of radiopharmaceutical. RADIOPHARMACEUTICALS:  26.5 mCi Technetium-102m MDP IV COMPARISON:  None. FINDINGS: Numerous foci of increased radiopharmaceutical uptake seen within right skull, bilateral ribs, right scapula thoracolumbar spine,  pelvis, and distal right femoral shaft, consistent with diffuse bone metastases. IMPRESSION: Widespread bone metastases involving both axial and appendicular skeleton, as described above. Electronically Signed   By: Earle Gell M.D.   On: 07/28/2015 15:16   Mr Shoulder Right W Wo Contrast  07/29/2015  CLINICAL DATA:  Breast cancer. Bone lesions. Right shoulder pain. Tenderness. EXAM: MRI OF THE RIGHT SHOULDER WITHOUT AND WITH CONTRAST TECHNIQUE: Multiplanar, multisequence MR imaging of the right shoulder was performed before and after the administration of intravenous contrast. CONTRAST:  22mL MULTIHANCE GADOBENATE DIMEGLUMINE 529 MG/ML IV SOLN COMPARISON:  07/22/2015 FINDINGS: Despite efforts by the technologist and patient, motion artifact is present on today's exam and could not be eliminated. This reduces exam sensitivity and specificity. Rotator cuff:  Mild infraspinatus and subscapularis tendinopathy. Muscles: Low-level edema in the distal trapezius muscle. Low-level edema proximally in the deltoid muscle. Biceps long head:  Unremarkable Acromioclavicular Joint: 4.7 by 2.2 by 1.6 cm destructive expansile metastatic lesion in the distal clavicle with cortical irregularity and posterior cortical breakthrough, surrounding soft tissue enhancement, and edema in the adjacent trapezius and deltoid muscles. This tumor extends to the distal clavicular articular margin. Mild lateral downsloping of the acromion. Subacromial morphology is type 2 (curved). There is some edema along the coracoclavicular ligament. Glenohumeral Joint: Mild degenerative chondral thinning. Labrum:  Unremarkable Bones: Diffuse osseous metastatic disease. While the destructive distal clavicular metastatic lesion is the most striking, there are numerous scattered metastatic lesions throughout the proximal humerus, scapula, and clavicle. These demonstrate high T2 signal and enhancement. Small left axillary lymph nodes. There probably metastatic  lesions involving the adjacent ribs although the area of the ribs this signal poor. IMPRESSION: 1. Diffuse multifocal osseous metastatic disease in the visualized bony structures. The dominant lesion is a large destructive expansile distal clavicular metastatic lesion with surrounding soft tissue edema and enhancement and cortical destruction. 2. Mild infraspinatus and subscapularis tendinopathy. 3. Mildly laterally downsloping acromion may predispose to impingement. 4. Minimal degenerative chondral thinning in the glenohumeral joint. Electronically Signed   By: Van Clines M.D.   On: 07/29/2015 08:53    Labs:  CBC:  Recent Labs  03/28/15 0020 07/21/15 1938 07/23/15 1111 08/13/15 0635  WBC 5.0 5.9 7.8 5.8  HGB 11.2* 11.2* 11.1* 10.8*  HCT 34.8* 34.8* 33.3* 32.9*  PLT 241 279 269 PLATELET CLUMPS NOTED ON SMEAR, UNABLE TO ESTIMATE    COAGS:  Recent Labs  08/13/15 0635  INR  1.06    BMP:  Recent Labs  12/02/14 1509  02/03/15 1202  03/28/15 0020 07/21/15 1938 07/23/15 1111 08/13/15 0635  NA 142  < > 141  < > 139 138 137 140  K 4.9  < > 3.9  < > 3.3* 4.4 4.1 3.7  CL 109  < > 106  --  105 98*  --  106  CO2 27  --   --   < > 25 26 25  21*  GLUCOSE 173*  < > 121*  < > 146* 198* 128 112*  BUN 14  < > 18  < > 18 12 17.0 9  CALCIUM 9.4  --   --   < > 9.3 10.2 10.6* 10.0  CREATININE 1.03*  < > 0.90  < > 0.95 1.15* 1.2* 0.98  GFRNONAA >60  --   --   --  >60 56*  --  >60  GFRAA >60  --   --   --  >60 >60  --  >60  < > = values in this interval not displayed.  LIVER FUNCTION TESTS:  Recent Labs  10/22/14 1007 12/02/14 1509 02/18/15 1408 07/23/15 1111  BILITOT 0.39 0.2* <0.20 Repeated and Verified 0.38  AST 16 24 16 25   ALT 16 23 21 20   ALKPHOS 90 76 64 114  PROT 7.6 8.0 7.7 8.6*  ALBUMIN 3.6 4.0 3.7 3.6    TUMOR MARKERS: No results for input(s): AFPTM, CEA, CA199, CHROMGRNA in the last 8760 hours.  Assessment and Plan:  History of Left Breast Cancer .   Mastectomy 04/2014.  New right shoulder pain.  MRI shows a large destructive expansile distal clavicular metastatic lesion    Will proceed with CT guided biopsy of the right clavicle lesion today by Dr. Laurence Ferrari  Risks and Benefits discussed with the patient including, but not limited to bleeding, infection, damage to adjacent structures or low yield requiring additional tests.  All of the patient's questions were answered, patient is agreeable to proceed. Consent signed and in chart.  Thank you for this interesting consult.  I greatly enjoyed meeting [place] and look forward to participating in their care.  A copy of this report was sent to the requesting provider on this date.  Electronically Signed: Murrell Redden PA-C 08/13/2015, 7:57 AM   I spent a total of  30 Minutes in face to face in clinical consultation, greater than 50% of which was counseling/coordinating care for CT guided

## 2015-08-13 NOTE — Discharge Instructions (Signed)
Needle Biopsy, Care After °These instructions give you information about caring for yourself after your procedure. Your doctor may also give you more specific instructions. Call your doctor if you have any problems or questions after your procedure. °HOME CARE °· Rest as told by your doctor. °· Take medicines only as told by your doctor. °· There are many different ways to close and cover the biopsy site, including stitches (sutures), skin glue, and adhesive strips. Follow instructions from your doctor about: °¨ How to take care of your biopsy site. °¨ When and how you should change your bandage (dressing). °¨ When you should remove your dressing. °¨ Removing whatever was used to close your biopsy site. °· Check your biopsy site every day for signs of infection. Watch for: °¨ Redness, swelling, or pain. °¨ Fluid, blood, or pus. °GET HELP IF: °· You have a fever. °· You have redness, swelling, or pain at the biopsy site, and it lasts longer than a few days. °· You have fluid, blood, or pus coming from the biopsy site. °· You feel sick to your stomach (nauseous). °· You throw up (vomit). °GET HELP RIGHT AWAY IF: °· You are short of breath. °· You have trouble breathing. °· Your chest hurts. °· You feel dizzy or you pass out (faint). °· You have bleeding that does not stop with pressure or a bandage. °· You cough up blood. °· Your belly (abdomen) hurts. °  °This information is not intended to replace advice given to you by your health care provider. Make sure you discuss any questions you have with your health care provider. °  °Document Released: 05/04/2008 Document Revised: 10/06/2014 Document Reviewed: 05/18/2014 °Elsevier Interactive Patient Education ©2016 [employer] ° °

## 2015-08-13 NOTE — Sedation Documentation (Signed)
Patient denies pain and is resting comfortably.  

## 2015-08-13 NOTE — Procedures (Signed)
Interventional Radiology Procedure Note  Procedure: CT guided bx RIGHT clavicle lesion  Complications: None  Estimated Blood Loss: <25 mL  Recommendations: - Bedrest x 1 hr - DC home - Path pending  Signed,  Criselda Peaches, MD

## 2015-08-16 ENCOUNTER — Encounter: Payer: Self-pay | Admitting: Medical Oncology

## 2015-08-16 DIAGNOSIS — C50412 Malignant neoplasm of upper-outer quadrant of left female breast: Secondary | ICD-10-CM

## 2015-08-16 LAB — GLUCOSE, CAPILLARY: GLUCOSE-CAPILLARY: 118 mg/dL — AB (ref 65–99)

## 2015-08-16 NOTE — Progress Notes (Signed)
Dr. Jana Hakim referred patient to [facility] study. Patient found to be ineligible due to eligibility requirement that patient's must be disease-free of prior invasive malignancies for >5 years. Patient has history of Thyroid Cancer in 2015. Dr. Jana Hakim informed via e-mail due to MD being out of office.  Adele Dan, RN, BSN Clinical Research 08/16/2015 4:44 PM

## 2015-08-17 ENCOUNTER — Encounter: Payer: Self-pay | Admitting: Pharmacist

## 2015-08-17 NOTE — Progress Notes (Signed)
Oral Chemotherapy Pharmacist Encounter   I spoke with patient for overview of new oral chemotherapy medication: Ibrance. Pt is doing well. The prescriptions have been sent to the [facility] for benefit analysis and approval. Pt has medicaid with low copay. Pt picked up the medication and started on Friday 08/13/15  Counseled patient on administration, dosing, side effects, safe handling, and monitoring. Side effects include but not limited to: Myelosuppression, fatigue, Nausea, vomiting, diarrhea, mouth sores.  Ms. Malcolm voiced understanding and appreciation.   All questions answered.  Will follow up in 1-2 weeks for adherence and toxicity management.   Thank you,  Montel Clock, PharmD, [facility]

## 2015-08-18 ENCOUNTER — Ambulatory Visit: Payer: Medicaid Other | Admitting: Radiation Oncology

## 2015-08-18 ENCOUNTER — Encounter: Payer: Self-pay | Admitting: Radiation Oncology

## 2015-08-18 ENCOUNTER — Ambulatory Visit
Admission: RE | Admit: 2015-08-18 | Discharge: 2015-08-18 | Disposition: A | Discharge status: Home / Self Care | Payer: Medicaid Other | Source: Ambulatory Visit | Attending: Radiation Oncology | Admitting: Radiation Oncology

## 2015-08-18 ENCOUNTER — Other Ambulatory Visit: Payer: Self-pay | Admitting: Nurse Practitioner

## 2015-08-18 ENCOUNTER — Inpatient Hospital Stay: Admission: RE | Admit: 2015-08-18 | Payer: Medicaid Other | Source: Ambulatory Visit | Admitting: Radiation Oncology

## 2015-08-18 VITALS — BP 137/73 | HR 85 | Temp 98.7°F | Resp 16 | Ht 63.0 in | Wt 191.2 lb

## 2015-08-18 DIAGNOSIS — C50412 Malignant neoplasm of upper-outer quadrant of left female breast: Secondary | ICD-10-CM

## 2015-08-18 DIAGNOSIS — Z510 Encounter for antineoplastic radiation therapy: Secondary | ICD-10-CM | POA: Insufficient documentation

## 2015-08-18 DIAGNOSIS — C7951 Secondary malignant neoplasm of bone: Secondary | ICD-10-CM | POA: Insufficient documentation

## 2015-08-18 MED ORDER — OXYCODONE HCL ER 10 MG PO T12A: 10.0000 mg | EXTENDED_RELEASE_TABLET | Freq: Two times a day (BID) | ORAL | Status: DC

## 2015-08-18 NOTE — Progress Notes (Signed)
Name: Shelly Hall   MRN: [id number]  Date:  08/18/2015  DOB: 03-07-1971  Status:Outpatient   DIAGNOSIS: Metastatic breast cancer to right shoulder and lower pelvis  CONSENT VERIFIED: yes   SET UP: Patient is setup supine   IMMOBILIZATION:  The following immobilization was used: Alpha cradle  NARRATIVE:  Pt [name] was brought to the CT Simulation planning suite.  Identity was confirmed.  All relevant records and images related to the planned course of therapy were reviewed.  Then, the patient was positioned in a stable reproducible clinical set-up for radiation therapy.  CT images were obtained.  An isocenter was placed. Skin markings were placed.  The CT images were loaded into the planning software where the target and avoidance structures were contoured.  The radiation prescription was entered and confirmed. The patient was discharged in stable condition and tolerated simulation well.    TREATMENT PLANNING NOTE:  Treatment planning then occurred. I have requested : MLC's, isodose plan, basic dose calculation  I have requested 3 dimensional simulation with DVH of cord, kidneys, bowel and GTV  A total of 7 complex treatment devices will be used in the form of unique MLCS  ------------------------------------------------  Thea Silversmith, MD This document serves as a record of services personally performed by Thea Silversmith, MD. It was created on her behalf by Jenell Milliner, a trained medical scribe. The creation of this record is based on the scribe's personal observations and the provider's statements to them. This document has been checked and approved by the attending provider.

## 2015-08-18 NOTE — Progress Notes (Signed)
Department of Radiation Oncology  Phone:  [phone] Fax:        [phone]   Name: Shelly Hall MRN: [id number]  DOB: [birth year]  Date: 08/18/2015  Follow Up Visit Note  Diagnosis: Breast cancer of upper-outer quadrant of left female breast [facility])   Staging form: Breast, AJCC 7th Edition     Clinical: Stage IIIA (T3, N1, cM0) - Unsigned       Staging comments: Staged at breast conference 10/15/13      Pathologic: Stage IV (Britt, [place], M1) - Signed by Chauncey Cruel, MD on 08/06/2015  Recurrent cancer of left breast [facility])   Staging form: Breast, AJCC 7th Edition     Clinical: Stage IV (M1) - Signed by Chauncey Cruel, MD on 08/07/2015   Summary and Interval since last radiation: 06/23/2014-08/11/2014  Site/dose:   Left chest wall / 50.4 Gray @ 1.8 [place] Cables per fraction x 28 fractions Left Supraclavicular fossa / 45 Gray @1 .8 [place] Cables per fraction x 25 fractions Left PAB / 45 Gy at 1.8 Gray per fraction x 25 fractions Left scar / 10 Gray at [employer] per fraction x 5 fractions  Interval History: Shelly Hall presents today for routine followup. She presented to the emergency room in February with shoulder pain. A CT of the chest at that time revealed progression of her thoracic adenopathy as well as development of multiple bone metastases particularly in the lower cervical and upper thoracic spine. A CA2729 was elevated at 217. A bone scan on 07/28/2015 showed uptake in essentially all bones including right skull, bilateral ribs, right scapula, and distal right femur. An MRI of the right shoulder showed a 4.7 cm mass in the distal clavicle. A biopsy of the right clavicle was obtained on 08/13/2015. This showed metastatic breast cancer. She presents today for consideration of palliative radiation to her right clavicle. As noted above, her previous radiation was to her left breast and axilla.   Today, she reports pain at a 6/10 in the low back and neck. She is currently taking Oxycodone for pain  control. She takes around 5 per day but is still in pain. She reports that it does not begin to relieve the pain until the evening time. She reports that her physican told her to take 2 at bed time. She reports that she tried to cook the other day and was standing for that period of time. She states that she was in a lot of pain afterwards. She reports having had two "good days" recently during which she was able to get things done; however, other than this, she feels the need to lay in bed and rest. She reports that she has been dealing with excruciating pain since October 2016. She reports that this pain got better for a period of time after seeing a chiropractor; however, this pain reappeared at the beginning of this year. She also reports that she has been experiencing cramps and has noted a tinge of blood coming from her vagina. She will be seeing a physician for this issue tomorrow.   Physical Exam:  Filed Vitals:   08/18/15 1314  BP: 137/73  Pulse: 85  Temp: 98.7 F (37.1 C)  TempSrc: Oral  Resp: 16  Height: 5\' 3"  (1.6 m)  Weight: 191 lb 3.2 oz (86.728 kg)  SpO2: 97%   Ms. Wissinger is alert and oriented.   IMPRESSION: Shelly Hall is a 45 y.o. female with metastatic left breast cancer.   PLAN:  We are  going to start her on Oxycontin 10 mg BID to help provide better pain control. I have also ordered a CT of the abdomen and pelvis to complete her staging and to further elucidate a target in the pelvis in source of her low back pain which is not well imaged on her bone scan. I have consented her for skin changes, nausea, vomiting, and fatigue. We will go ahead and simulate her today. She can continue on her Ibrance and Femara. I discussed with her starting a stool softener.   ------------------------------------------------  Thea Silversmith, MD  This document serves as a record of services personally performed by Thea Silversmith, MD. It was created on her behalf by Jenell Milliner, a trained  medical scribe. The creation of this record is based on the scribe's personal observations and the provider's statements to them. This document has been checked and approved by the attending provider.

## 2015-08-18 NOTE — Addendum Note (Signed)
Encounter addended by: Lowry Bowl on: 08/18/2015  2:31 PM<BR>     Documentation filed: Notes Section

## 2015-08-19 ENCOUNTER — Ambulatory Visit (HOSPITAL_COMMUNITY)
Admission: RE | Admit: 2015-08-19 | Discharge: 2015-08-19 | Disposition: A | Discharge status: Home / Self Care | Payer: Medicaid Other | Source: Ambulatory Visit | Attending: Radiation Oncology | Admitting: Radiation Oncology

## 2015-08-19 DIAGNOSIS — I7090 Unspecified atherosclerosis: Secondary | ICD-10-CM | POA: Insufficient documentation

## 2015-08-19 DIAGNOSIS — C50412 Malignant neoplasm of upper-outer quadrant of left female breast: Secondary | ICD-10-CM

## 2015-08-19 DIAGNOSIS — K769 Liver disease, unspecified: Secondary | ICD-10-CM | POA: Diagnosis not present

## 2015-08-19 DIAGNOSIS — C7951 Secondary malignant neoplasm of bone: Secondary | ICD-10-CM | POA: Insufficient documentation

## 2015-08-19 DIAGNOSIS — K8020 Calculus of gallbladder without cholecystitis without obstruction: Secondary | ICD-10-CM | POA: Diagnosis not present

## 2015-08-19 MED ORDER — IOHEXOL 300 MG/ML  SOLN
100.0000 mL | Freq: Once | INTRAMUSCULAR | Status: AC | PRN
  Administered 2015-08-19: 100 mL via INTRAVENOUS

## 2015-08-20 ENCOUNTER — Telehealth: Payer: Self-pay | Admitting: *Deleted

## 2015-08-20 DIAGNOSIS — Z510 Encounter for antineoplastic radiation therapy: Secondary | ICD-10-CM | POA: Diagnosis not present

## 2015-08-20 NOTE — Addendum Note (Signed)
Encounter addended by: Malena Edman, RN on: 08/20/2015  5:34 PM<BR>     Documentation filed: Demographics Visit

## 2015-08-20 NOTE — Telephone Encounter (Signed)
Called Mrs. Affinito she was not home.  Spoke with her mother I let her know that I was calling for Dr. Pablo Ledger my name was [facility] nurse  for Dr. Pablo Ledger in re: to her  daughter's new medication prescription pharmacy was not able to fill the prescription without additional paperwork.   Told the mother that Dr. Pablo Ledger said to have Mrs. Dorian take two od the Oxycodone at a time over the weekend if needed for pain control and we would get in touch with pharmacy about what they need to approve the medication.  The mother stated she would let her daughter know what is going on in re: to getting the medication approved and how to take the Oxycodone until the new medication is approved.

## 2015-08-23 ENCOUNTER — Ambulatory Visit
Admission: RE | Admit: 2015-08-23 | Discharge: 2015-08-23 | Disposition: A | Discharge status: Home / Self Care | Payer: Medicaid Other | Source: Ambulatory Visit | Attending: Radiation Oncology | Admitting: Radiation Oncology

## 2015-08-23 ENCOUNTER — Encounter: Payer: Self-pay | Admitting: *Deleted

## 2015-08-23 DIAGNOSIS — Z510 Encounter for antineoplastic radiation therapy: Secondary | ICD-10-CM | POA: Diagnosis not present

## 2015-08-23 NOTE — Progress Notes (Signed)
[place] Psychosocial Distress Screening Clinical Social Work  Clinical Social Work was referred by distress screening protocol.  The patient scored a 8 on the Psychosocial Distress Thermometer which indicates severe distress. Clinical Social Worker phoned pt to assess for distress and other psychosocial needs. Pt was not at home when CSW called. Pt's mother answered phone and CSW left message. CSW will attempt to check in later this week at one of pt's appointments at [facility].   ONCBCN DISTRESS SCREENING 08/18/2015  Screening Type Change in Status  Distress experienced in past week (1-10) 8  Emotional problem type Depression;Nervousness/Anxiety;Adjusting to illness;Feeling hopeless  Spiritual/Religous concerns type Facing my mortality  Physical Problem type Pain;Getting around;Bathing/dressing;Loss of appetitie;Tingling hands/feet  Physician notified of physical symptoms Yes    Clinical Social Worker follow up needed: Yes.    If yes, follow up plan: See above Loren Racer, [place] Worker [place]  [facility] Phone: [phone] Fax: [phone]

## 2015-08-24 ENCOUNTER — Ambulatory Visit
Admission: RE | Admit: 2015-08-24 | Discharge: 2015-08-24 | Disposition: A | Discharge status: Home / Self Care | Payer: Medicaid Other | Source: Ambulatory Visit | Attending: Radiation Oncology | Admitting: Radiation Oncology

## 2015-08-24 ENCOUNTER — Telehealth: Payer: Self-pay | Admitting: Pharmacist

## 2015-08-24 ENCOUNTER — Encounter: Payer: Self-pay | Admitting: Radiation Oncology

## 2015-08-24 VITALS — BP 153/117 | HR 73 | Temp 97.4°F | Resp 18 | Ht 63.0 in | Wt 192.0 lb

## 2015-08-24 DIAGNOSIS — C50412 Malignant neoplasm of upper-outer quadrant of left female breast: Secondary | ICD-10-CM | POA: Diagnosis not present

## 2015-08-24 DIAGNOSIS — Z510 Encounter for antineoplastic radiation therapy: Secondary | ICD-10-CM | POA: Diagnosis not present

## 2015-08-24 DIAGNOSIS — Z923 Personal history of irradiation: Secondary | ICD-10-CM | POA: Diagnosis not present

## 2015-08-24 MED ORDER — OXYCODONE-ACETAMINOPHEN 5-325 MG PO TABS: 1.0000 | ORAL_TABLET | Freq: Four times a day (QID) | ORAL | Status: DC | PRN

## 2015-08-24 MED ORDER — MORPHINE SULFATE ER 15 MG PO TBCR: 15.0000 mg | EXTENDED_RELEASE_TABLET | Freq: Two times a day (BID) | ORAL | Status: DC

## 2015-08-24 MED ORDER — RADIAPLEXRX EX GEL
Freq: Once | CUTANEOUS | Status: AC
Start: 2015-08-24 — End: 2015-08-24
  Administered 2015-08-24: 17:00:00 via TOPICAL

## 2015-08-24 NOTE — Addendum Note (Signed)
Encounter addended by: Malena Edman, RN on: 08/24/2015  4:37 PM<BR>     Documentation filed: Orders, [employer], Inpatient [facility]

## 2015-08-24 NOTE — Telephone Encounter (Signed)
Oral Chemotherapy Follow-Up   Original Start date of oral chemotherapy: 08/13/15  Called patient today to follow up regarding patient's oral chemotherapy medication: Ibrance  Pt reports 0 tablets/doses missed in the last 9 days.  She reports (correctly) taking Ibrance 125 mg capsule once daily with breakfast x 21 days then "off" for 1 week.  Cycle is every 28 days.   She is also taking Letrozole.  She takes this daily at the same time as [name] and understands she takes no break from the letrozole.  Pt reports the following side effects:  Fatigue- energy level down Nausea- worse after first starting Ibrance.  No nausea in the past 2-3 days.  She did not require antiemetic rescue agent. Appetite decreased.  She has to force herself to eat unless she goes long periods without eating and then she eats more. No diarrhea or mouth sores.  She returns this Fri (3/24) for lab & to see Dr. Jana Hakim.  Will follow up and call patient again in 1 week x 1 and then monthly unless needed.  Thank you,  Kennith Center, Pharm.D., CPP 08/24/2015@4 :40 PM Oral Chemotherapy Clinic

## 2015-08-24 NOTE — Progress Notes (Signed)
Weekly Management Note Current Dose: 6 Gy  Projected Dose: 30 Gy   Narrative:  The patient presents for routine under treatment assessment.  CBCT/MVCT images/Port film x-rays were reviewed.  The chart was checked. C/o 7/10 pain. Now pain is worse down left leg. Did not take 2 pain pills prior to treatment because she had to drive herself. Could not get oxycontin filled due to medicaid not covering. Needs coverage at night so that when she wakes up pain is controlled.   Physical Findings: Weight: 192 lb (87.091 kg). Unchanged  Impression:  The patient is tolerating radiation.  Plan:  Continue treatment as planned. Added mscontin at night. Refilled oxycodone. Asked SW to reach out for possible transportation assistance.

## 2015-08-24 NOTE — Addendum Note (Signed)
Encounter addended by: Malena Edman, RN on: 08/24/2015  4:34 PM<BR>     Documentation filed: Dx [employer], Inpatient MAR, Orders

## 2015-08-24 NOTE — Progress Notes (Addendum)
Ms. Crisman has received 2 fractions to her right shoulder,lt. Femur and pelvis.  Skin to the treatment areas all have normal color.  Appetite is all right forcing her self to eat at times.  Feeling fatigue when up and active.  Pain level 7/10 to right shoulder,back,hips and pelvis taking Oxycodone for pain control.  Education done asked her to let me know if she has any skin irritation in the further she can get a skin care product.  Reviewed the pelvic area in the radiation booklet. BP 153/117 mmHg  Pulse 73  Temp(Src) 97.4 F (36.3 C) (Oral)  Resp 18  Ht 5\' 3"  (1.6 m)  Wt 192 lb (87.091 kg)  BMI 34.02 kg/m2  SpO2 99%  LMP 12/07/2014

## 2015-08-25 ENCOUNTER — Ambulatory Visit
Admission: RE | Admit: 2015-08-25 | Discharge: 2015-08-25 | Disposition: A | Discharge status: Home / Self Care | Payer: Medicaid Other | Source: Ambulatory Visit | Attending: Radiation Oncology | Admitting: Radiation Oncology

## 2015-08-25 ENCOUNTER — Other Ambulatory Visit: Payer: Self-pay | Admitting: Nurse Practitioner

## 2015-08-25 DIAGNOSIS — Z510 Encounter for antineoplastic radiation therapy: Secondary | ICD-10-CM | POA: Diagnosis not present

## 2015-08-26 ENCOUNTER — Ambulatory Visit
Admission: RE | Admit: 2015-08-26 | Discharge: 2015-08-26 | Disposition: A | Discharge status: Home / Self Care | Payer: Medicaid Other | Source: Ambulatory Visit | Attending: Radiation Oncology | Admitting: Radiation Oncology

## 2015-08-26 ENCOUNTER — Other Ambulatory Visit: Payer: Self-pay

## 2015-08-26 DIAGNOSIS — C50412 Malignant neoplasm of upper-outer quadrant of left female breast: Secondary | ICD-10-CM

## 2015-08-26 DIAGNOSIS — Z510 Encounter for antineoplastic radiation therapy: Secondary | ICD-10-CM | POA: Diagnosis not present

## 2015-08-26 NOTE — Telephone Encounter (Signed)
Thank you :)

## 2015-08-27 ENCOUNTER — Ambulatory Visit (HOSPITAL_BASED_OUTPATIENT_CLINIC_OR_DEPARTMENT_OTHER): Payer: Medicaid Other | Admitting: Oncology

## 2015-08-27 ENCOUNTER — Ambulatory Visit (HOSPITAL_COMMUNITY)
Admission: RE | Admit: 2015-08-27 | Discharge: 2015-08-27 | Disposition: A | Discharge status: Home / Self Care | Payer: Medicaid Other | Source: Ambulatory Visit | Attending: Oncology | Admitting: Oncology

## 2015-08-27 ENCOUNTER — Ambulatory Visit
Admission: RE | Admit: 2015-08-27 | Discharge: 2015-08-27 | Disposition: A | Discharge status: Home / Self Care | Payer: Medicaid Other | Source: Ambulatory Visit | Attending: Radiation Oncology | Admitting: Radiation Oncology

## 2015-08-27 ENCOUNTER — Other Ambulatory Visit (HOSPITAL_BASED_OUTPATIENT_CLINIC_OR_DEPARTMENT_OTHER): Payer: Medicaid Other

## 2015-08-27 ENCOUNTER — Telehealth: Payer: Self-pay | Admitting: Oncology

## 2015-08-27 VITALS — BP 161/112 | HR 83 | Temp 97.7°F | Resp 18 | Ht 63.0 in | Wt 189.0 lb

## 2015-08-27 DIAGNOSIS — Z170 Estrogen receptor positive status [ER+]: Secondary | ICD-10-CM | POA: Diagnosis not present

## 2015-08-27 DIAGNOSIS — D702 Other drug-induced agranulocytosis: Secondary | ICD-10-CM

## 2015-08-27 DIAGNOSIS — D869 Sarcoidosis, unspecified: Secondary | ICD-10-CM

## 2015-08-27 DIAGNOSIS — Z79811 Long term (current) use of aromatase inhibitors: Secondary | ICD-10-CM

## 2015-08-27 DIAGNOSIS — C7951 Secondary malignant neoplasm of bone: Secondary | ICD-10-CM | POA: Insufficient documentation

## 2015-08-27 DIAGNOSIS — G893 Neoplasm related pain (acute) (chronic): Secondary | ICD-10-CM | POA: Insufficient documentation

## 2015-08-27 DIAGNOSIS — C50812 Malignant neoplasm of overlapping sites of left female breast: Secondary | ICD-10-CM

## 2015-08-27 DIAGNOSIS — Z510 Encounter for antineoplastic radiation therapy: Secondary | ICD-10-CM | POA: Diagnosis not present

## 2015-08-27 DIAGNOSIS — C773 Secondary and unspecified malignant neoplasm of axilla and upper limb lymph nodes: Secondary | ICD-10-CM | POA: Diagnosis not present

## 2015-08-27 DIAGNOSIS — C50412 Malignant neoplasm of upper-outer quadrant of left female breast: Secondary | ICD-10-CM

## 2015-08-27 DIAGNOSIS — C50912 Malignant neoplasm of unspecified site of left female breast: Secondary | ICD-10-CM

## 2015-08-27 DIAGNOSIS — M25511 Pain in right shoulder: Secondary | ICD-10-CM

## 2015-08-27 LAB — COMPREHENSIVE METABOLIC PANEL
ALBUMIN: 3.6 g/dL (ref 3.5–5.0)
ALK PHOS: 99 U/L (ref 40–150)
ALT: 18 U/L (ref 0–55)
ANION GAP: 9 meq/L (ref 3–11)
AST: 25 U/L (ref 5–34)
BUN: 13.7 mg/dL (ref 7.0–26.0)
CO2: 26 mEq/L (ref 22–29)
Calcium: 9.8 mg/dL (ref 8.4–10.4)
Chloride: 106 mEq/L (ref 98–109)
Creatinine: 1.1 mg/dL (ref 0.6–1.1)
EGFR: 69 mL/min/{1.73_m2} — AB (ref >90)
GLUCOSE: 142 mg/dL — AB (ref 70–140)
Potassium: 4.5 mEq/L (ref 3.5–5.1)
Sodium: 141 mEq/L (ref 136–145)
TOTAL PROTEIN: 7.7 g/dL (ref 6.4–8.3)

## 2015-08-27 LAB — CBC WITH DIFFERENTIAL/PLATELET
BASO%: 0.3 % (ref 0.0–2.0)
Basophils Absolute: 0 10*3/uL (ref 0.0–0.1)
EOS ABS: 0 10*3/uL (ref 0.0–0.5)
EOS%: 2.7 % (ref 0.0–7.0)
HEMATOCRIT: 31.3 % — AB (ref 34.8–46.6)
HEMOGLOBIN: 10.2 g/dL — AB (ref 11.6–15.9)
LYMPH#: 0.3 10*3/uL — AB (ref 0.9–3.3)
LYMPH%: 19.6 % (ref 14.0–49.7)
MCH: 28.8 pg (ref 25.1–34.0)
MCHC: 32.5 g/dL (ref 31.5–36.0)
MCV: 88.7 fL (ref 79.5–101.0)
MONO#: 0.1 10*3/uL (ref 0.1–0.9)
MONO%: 3.4 % (ref 0.0–14.0)
NEUT%: 74 % (ref 38.4–76.8)
NEUTROS ABS: 1.3 10*3/uL — AB (ref 1.5–6.5)
PLATELETS: 213 10*3/uL (ref 145–400)
RBC: 3.54 10*6/uL — ABNORMAL LOW (ref 3.70–5.45)
RDW: 14.7 % — AB (ref 11.2–14.5)
WBC: 1.7 10*3/uL — ABNORMAL LOW (ref 3.9–10.3)

## 2015-08-27 MED ORDER — METHADONE HCL 5 MG PO TABS: 5.0000 mg | ORAL_TABLET | Freq: Three times a day (TID) | ORAL | Status: DC

## 2015-08-27 MED FILL — METHADONE HCL 5 MG TABLET: 5 | 30 days supply | Qty: 90 | Fill #0

## 2015-08-27 NOTE — Progress Notes (Signed)
[facility]  [phone] [phone]) [phone]    ID: Shelly Hall OB: 03/22/1971  MR#: [id number]  VQM#:[id number]  PCP: Shelly Peers, MD GYN:  [facility] SU: [facility] OTHER MD: [facility]  CHIEF COMPLAINT:  Left Breast Cancer, estrogen receptor positive  CURRENT TREATMENT: letrozole, palbociclib, [zolendronate]  BREAST CANCER HISTORY: From the original intake note:  Shelly Hall palpated a mass in her left breast mid-April 2015 and immediately brought it to Dr. Benjie Karvonen' attention. She was set up for bilateral screening mammography with tomography [facility]. The possible distortion in the left breast and a nearby group of calcifications was felt to warrant further evaluation, and on 09/30/2013 she underwent unilateral left diagnostic mammography and ultrasonography. There was an irregular mass in the outer left breast associated with microcalcifications. 4 cm anterior to this there was another cluster of pleomorphic calcifications spanning 7 mm. He had more anteriorly there was an irregular mass associated with other calcifications. In total the abnormalities in the left breast spent approximately 10 cm, and is segmental distribution. On exam there was a palpable firm mass at the 2:30 o'clock position of the left breast 10 cm from the nipple. There was palpable adenopathy in the inferior left axilla. Ultrasound confirmed an irregular hypoechoic mass in the left breast measuring 4.6 cm. In addition, a 1.4 cm oval slightly irregular mass was noted at the 3:00 position and yet another mass measured 9 mm in the same area. Ultrasound of the left axilla showed a dominant 3.6 cm lymph node.  Biopsy of 2 of the left breast masses and the suspicious left breast lymph node on 10/03/2013 showed ([id number]) an invasive ductal carcinoma, grade 2, estrogen receptor 90% positive with strong staining intensity, progesterone receptor 40% positive but  moderate staining intensity, with an MIB-1 of 20% and no HER-2 amplification. (Because all 3 biopsies were morphologically identical, only one prognostic panel was sent).  On 10/10/2013 the patient underwent bilateral breast MRI. This showed numerous enhancing masses in the left breast, the largest measuring 3.5 cm, and the aggregate measuring 12.7 cm. There were numerous enlarged left axillary lymph nodes, at least 5 of which were enlarged, both at levels 1 and 2. The largest measured 2.3 cm. The right breast was negative.  The patient's subsequent history is as detailed below.  INTERVAL HISTORY: Shelly Hall returns today for review of her stage IV breast cancer. Recent workup showed multiple bony lesions) no evidence of lung or liver involvement. On 08/13/2015 she underwent biopsy of her right clavicular mass and this showed ([id number]) metastatic adenocarcinoma which was 100% estrogen receptor positive with strong staining intensity, progesterone receptor negative, and HER-2 not amplified, with a signals ratio 1.09 and the number per cell 1.20.  On 08/13/2015 Shelly Hall was started on letrozole and palbociclib. Today is day 15 cycle 1. She is tolerating these well, with some hot flashes but no significant problems with vaginal dryness. She does have significant bony pain, which is going to be due not so much to the drug but to her bony metastatic disease.  She met with Shelly Hall 08/18/2015. A CT of the abdomen was obtained the next day. This showed additional bony lesions, but no definitive evidence of liver involvement. Palliative radiation was started 08/24/2015.  REVIEW OF SYSTEMS: Shelly Hall's pain is not controlled. She was crying during the visit today. Currently the worst pain is down and her left calf area. This is bony pain not soft tissue pain. It feels  better when she is walking, and worst at night. She has been taking 2 oxycodone tablets 3-4 times a day as well as Naprosyn 1 tablet 3 times a  day. This is not controlling the pain. She has been unable to obtain either OxyContin or MS Contin because of insurance denial's. She is not constipated despite all the narcotics. She is severely fatigued. Aside from the pain issue a detailed review of systems today was stable  PAST MEDICAL HISTORY: Past Medical History  Diagnosis Date  . Hypertension   . Sarcoidosis ([facility])     no problems per patient   . Anemia   . Complication of anesthesia     makes pt. restless and unable to be still  . High cholesterol   . Thyroid cancer ([facility]) 2015  . Type II diabetes mellitus ([place])   . Breast cancer ([facility]) 2015    left upper outer;  had chemo  . Radiation 06/23/14-08/11/14    Invasive ductal ca o left breast  . SVD (spontaneous vaginal delivery)     x 2  . Heart murmur     never had any problems as adult  . Bronchitis 2014  . Depression     no meds currently  . Anxiety     no meds currently  . GERD (gastroesophageal reflux disease)     PAST SURGICAL HISTORY: Past Surgical History  Procedure Laterality Date  . Tubal ligation  2001  . Endobronchial ultrasound Bilateral 12/02/2012    Procedure: ENDOBRONCHIAL ULTRASOUND;  Surgeon: Collene Gobble, MD;  [facility]: [facility];  Service: Cardiopulmonary;  Laterality: Bilateral;  . Portacath placement N/A 10/22/2013    Procedure: INSERTION PORT-A-CATH;  Surgeon: Shelly Klein, MD;  [facility];  Service: General;  Laterality: N/A;  . Lung biopsy  2014  . Mastectomy w/ sentinel node biopsy Left 04/23/2014    & axillary LND  . Mastectomy w/ sentinel node biopsy Left 04/23/2014    Procedure: LEFT BREAST MASTECTOMY WITH SENTINEL LYMPH NODE BIOPSY AND AXILLARY LYMPH NODE DISECTION;  Surgeon: Shelly Klein, MD;  [facility]: [facility];  Service: General;  Laterality: Left;  . Thyroid lobectomy Left 04/23/2014    Procedure: LEFT THYROID LOBECTOMY;  Surgeon: Shelly Klein, MD;  [facility];  Service: General;  Laterality: Left;  . Breast biopsy Left 2015 X  3    biopsy  . Wisdom tooth extraction    . Leep    . Laparoscopic assisted vaginal hysterectomy N/A 12/09/2014    Procedure: LAPAROSCOPIC ASSISTED VAGINAL HYSTERECTOMY;  Surgeon: Cheri Fowler, MD;  Location: [facility];  Service: Gynecology;  Laterality: N/A;  . Salpingoophorectomy Bilateral 12/09/2014    Procedure: SALPINGO OOPHORECTOMY;  Surgeon: Cheri Fowler, MD;  Location: [facility];  Service: Gynecology;  Laterality: Bilateral;  . Abdominal hysterectomy    . Port-a-cath removal Left 02/03/2015    Procedure: REMOVAL PORT-A-CATH;  Surgeon: Shelly Klein, MD;  Location: [facility];  Service: General;  Laterality: Left;    FAMILY HISTORY Family History  Problem Relation Age of Onset  . Cancer Paternal Aunt     "bone cancer"; deceased 79s  . Cancer Paternal Uncle     stomach cancer; deceased 96s  . Cancer Paternal Aunt     unk. primary; currently 48s  . Prostate cancer Maternal Grandfather 23  . Asthma Mother   The patient's parents are living, in fair mid to late 65s. The patient had one brother, no sisters. There is no history of breast or  ovarian cancer in the family to her knowledge.   GYNECOLOGIC HISTORY:   (Reviewed 11/27/2013)  menarche age 65, first live birth at 31. She is GX P2. She was still having regular periods at the time of the start of her chemotherapy in May 2015.  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 She is single and lives alone, although her mother is currently staying with her. Her son Henreitta Spittler lives in [place] and works at [employer]. Son [name] lives in [place] where he works at a [employer]. The patient has 1 grandchild born in Sept 2015, by her son, [name]. She is a Psychologist, forensic.    ADVANCED DIRECTIVES: not in place   HEALTH MAINTENANCE: (Updated 11/27/2013) Social History  Substance Use Topics  . Smoking status: Current Some Day Smoker -- 0.25 packs/day for 25 years     Types: Cigarettes  . Smokeless tobacco: Never Used     Comment: 04/23/2014 "using vapor; down to 3-4 cigarettes/day now"  . Alcohol Use: 6.0 oz/week    4 Cans of beer, 4 Shots of liquor, 2 Standard drinks or equivalent per week     Colonoscopy: Never  PAP: Not on file  Bone density: Not on file  Lipid panel:  Not on file  Allergies  Allergen Reactions  . Nyquil Multi-Symptom [Pseudoeph-Doxylamine-Dm-Apap] Itching and Other (See Comments)    Skin peels on hands, and feet    Current Outpatient Prescriptions  Medication Sig Dispense Refill  . albuterol (PROVENTIL HFA;VENTOLIN HFA) 108 (90 BASE) MCG/ACT inhaler Inhale 2 puffs into the lungs every 6 (six) hours as needed for wheezing.     Marland Kitchen atenolol-chlorthalidone (TENORETIC) 50-25 MG per tablet Take 1 tablet by mouth daily. 30 tablet 1  . ferrous sulfate 325 (65 FE) MG tablet Take 325 mg by mouth daily with breakfast.     . FLUoxetine (PROZAC) 20 MG tablet Take 20 mg by mouth every morning.    . gabapentin (NEURONTIN) 300 MG capsule Take 2 capsules (600 mg total) by mouth 2 (two) times daily. (Patient taking differently: Take 300-600 mg by mouth 3 (Shelly) times daily. Take 33m in the morning and afternoon and 6072min the evening) 120 capsule 5  . letrozole (FEMARA) 2.5 MG tablet Take 1 tablet (2.5 mg total) by mouth daily. 90 tablet 4  . metFORMIN (GLUCOPHAGE) 1000 MG tablet Take 1,000 mg by mouth 2 (two) times daily with a meal.     . morphine (MS CONTIN) 15 MG 12 hr tablet Take 1 tablet (15 mg total) by mouth every 12 (twelve) hours. 60 tablet 0  . Multiple Vitamin (MULTIVITAMIN WITH MINERALS) TABS tablet Take 1 tablet by mouth daily.    . naproxen (NAPROSYN) 500 MG tablet Take 1 tablet (500 mg total) by mouth 3 (Shelly) times daily with meals. 90 tablet 3  . oxyCODONE-acetaminophen (PERCOCET/ROXICET) 5-325 MG tablet Take 1 tablet by mouth every 6 (six) hours as needed for severe pain. 60 tablet 0  . palbociclib (IBRANCE) 125 MG  capsule Take 1 capsule (125 mg total) by mouth daily with breakfast. Take whole with food. 21 capsule 3   No current facility-administered medications for this visit.    OBJECTIVE: Middle-aged AfSerbiamerican woman In significant distress Filed Vitals:   08/27/15 1443  BP: 161/112  Pulse: 83  Temp: 97.7 F (36.5 C)  Resp: 18     Body mass index is 33.49 kg/(m^2).    ECOG FS:1 -  Symptomatic but completely ambulatory   Sclerae unicteric, pupils round and equal Oropharynx clear and moist-- no thrush or other lesions Lungs no rales or rhonchi Heart regular rate and rhythm Abd soft, nontender, positive bowel sounds Neuro: nonfocal, well oriented, tearful affect Breasts: Deferred  LAB RESULTS:   Lab Results  Component Value Date   WBC 1.7* 08/27/2015   NEUTROABS 1.3* 08/27/2015   HGB 10.2* 08/27/2015   HCT 31.3* 08/27/2015   MCV 88.7 08/27/2015   PLT 213 08/27/2015      Chemistry      Component Value Date/Time   NA 141 08/27/2015 1354   NA 140 08/13/2015 0635   K 4.5 08/27/2015 1354   K 3.7 08/13/2015 0635   CL 106 08/13/2015 0635   CO2 26 08/27/2015 1354   CO2 21* 08/13/2015 0635   BUN 13.7 08/27/2015 1354   BUN 9 08/13/2015 0635   CREATININE 1.1 08/27/2015 1354   CREATININE 0.98 08/13/2015 0635      Component Value Date/Time   CALCIUM 9.8 08/27/2015 1354   CALCIUM 10.0 08/13/2015 0635   ALKPHOS 99 08/27/2015 1354   ALKPHOS 76 12/02/2014 1509   AST 25 08/27/2015 1354   AST 24 12/02/2014 1509   ALT 18 08/27/2015 1354   ALT 23 12/02/2014 1509   BILITOT <0.30 08/27/2015 1354   BILITOT 0.2* 12/02/2014 1509      STUDIES: Ct Chest W Contrast  08/03/2015  CLINICAL DATA:  History of left breast cancer. History of sarcoidosis per electronic medical records. Status post chemotherapy completed in October 2015 and radiation therapy completed in March 2016, currently on tamoxifen. EXAM: CT CHEST WITH CONTRAST TECHNIQUE: Multidetector CT imaging of the chest was  performed during intravenous contrast administration. CONTRAST:  46m OMNIPAQUE IOHEXOL 300 MG/ML  SOLN COMPARISON:  07/22/2015 chest CT. FINDINGS: Mediastinum/Nodes: Normal heart size. No pericardial fluid/thickening. Great vessels are normal in course and caliber. No central pulmonary emboli. Status post left hemithyroidectomy with normal appearance of the visualized remnant right thyroid lobe. Normal esophagus. Multiple left axillary surgical clips. No axillary adenopathy. Moderate bilateral paratracheal, AP window, subcarinal and bilateral hilar lymphadenopathy is not appreciably changed back to 10/20/2013. For example a 1.6 cm right paratracheal node (series 3/ image 18) measured 1.5 cm on 10/20/2013, not appreciably changed. A 2.1 cm subcarinal node (series 3/ image 27) previously measured 2.1 cm on 10/20/2013, unchanged. A 1.9 cm AP window node (series 3/ image 19) previously measured 1.8 cm, not appreciably changed. A 1.5 cm right infrahilar node (series 3/ image 31) previously measured 1.5 cm, unchanged. A 1.4 cm left infrahilar node (series 3/ image 30) previously measured 1.4 cm, unchanged. No new pathologically enlarged mediastinal or hilar nodes. Lungs/Pleura: No pneumothorax. No residual pleural effusion. Mild centrilobular emphysema. Sharply marginated mild subpleural reticulation in the anterior left upper lobe is in keeping with mild radiation fibrosis. Subpleural 5 mm pulmonary nodule in the posterior left upper lobe (series 6/ image 12) is stable since 10/28/2012 and benign. Right middle lobe 3 mm pulmonary nodule (series 6/ image 36) is stable since 10/28/2012 and benign. Subpleural anterior left lower lobe 3 mm pulmonary nodule associated with the major fissure (series 6/image 29) is stable since 10/28/2012 and benign. There mild patchy ground-glass opacities in the right greater than left upper lobes and superior segment right lower lobe, significantly decreased since 07/22/2015, suggesting a  resolving inflammatory process. No acute consolidative airspace disease, new significant pulmonary nodules or lung masses. Upper abdomen: There is a 1.9  cm heterogeneously enhancing lateral segment left liver lobe mass (series 3/ image 43), stable since 10/28/2012, in keeping with a benign hemangioma. There is a 1.5 cm hypodense left liver dome mass (series 3/ image 41), stable since 10/20/2013 PET-CT, where it was non FDG avid, in keeping with a benign liver lesion. Musculoskeletal: There are innumerable small lytic osseous lesions scattered throughout the lower cervical spine, entire thoracic spine and right lower scapula, which are all new since 10/20/2013 and are worrisome for osseous metastases. IMPRESSION: 1. Innumerable small lytic osseous lesions scattered throughout the visualized cervicothoracic spine and right lower scapula, all new since 10/20/2013, most worrisome for osseous metastases. 2. No additional sites of definite metastatic disease in the chest. Moderate mediastinal and bilateral hilar lymphadenopathy is not appreciably changed back to 10/20/2013 and most compatible with sarcoidosis. 3. Mild patchy ground-glass opacity in the right greater than left upper lungs, significantly decreased since 07/22/2015, suggesting a resolving inflammatory process. 4. Mild centrilobular emphysema. Electronically Signed   By: Ilona Sorrel M.D.   On: 08/03/2015 16:07   Ct Abdomen Pelvis W Contrast  08/19/2015  CLINICAL DATA:  Breast cancer diagnosed 5/15. Chemotherapy and radiation therapy completed in October of 2015 and March 2016 respectively. Left mastectomy. EXAM: CT ABDOMEN AND PELVIS WITH CONTRAST TECHNIQUE: Multidetector CT imaging of the abdomen and pelvis was performed using the standard protocol following bolus administration of intravenous contrast. CONTRAST:  180m OMNIPAQUE IOHEXOL 300 MG/ML  SOLN COMPARISON:  Chest CTs, most recent 08/03/2015. PET 10/20/2013. No prior dedicated abdominal pelvic  CTs. FINDINGS: Lower chest: 3 mm right middle lobe pulmonary nodule image 5/series 4 similar to the most recent chest CT. There is also a 2 mm subpleural right lower lobe pulmonary nodule on image 6/series 4 which is unchanged. A nodule along the left major fissure is similar at 3 mm on image 2/series 4. Normal heart size without pericardial or pleural effusion. Lower thoracic adenopathy has been detailed on recent chest CTs. Hepatobiliary: High left hepatic lobe hypo attenuating lesions, including on images 11 and 13/ series 2 are present on 10/20/2013 and likely represent hemangiomas. A high segment 4A hypo attenuating lesion measures 1.3 cm on image 14/series 2. Not readily apparent on the prior chest CT of 10/20/2013 or PET of 10/20/2013. There is also vague heterogeneous attenuation involving the right lobe of the liver including on image 25/series 2. These areas have not been evaluated on contrast-enhanced studies previously. Small gallstone without acute cholecystitis or biliary duct dilatation. Pancreas: Normal, without mass or ductal dilatation. Spleen: Normal in size, without focal abnormality. Adrenals/Urinary Tract: Normal right adrenal gland. Minimal left adrenal thickening. No hydronephrosis. Normal urinary bladder. Stomach/Bowel: Proximal gastric underdistention. Otherwise normal stomach. Normal colon, appendix, and terminal ileum. Normal small bowel. Vascular/Lymphatic: Age advanced aortic and branch vessel atherosclerosis. No abdominopelvic adenopathy. Reproductive: Hysterectomy.  No adnexal mass. Other: No significant free fluid. No evidence of omental or peritoneal disease. Musculoskeletal: Innumerable lucent lesions throughout the pelvis and lumbar spine. Example in the left iliac at 2.8 cm on image 58/ series 2. No complicating compression deformity. IMPRESSION: 1. Widespread osseous metastasis, as on prior chest imaging. 2. High left hepatic lobe hypo attenuating lesions are similar back to  2015 and likely benign hemangiomas. A segment 4A lesion is not readily apparent on prior exams. In addition, vague heterogeneous density/enhancement is seen throughout the remainder of the liver. Cannot exclude hepatic metastasis or less likely hepatic sarcoidosis. Consider dedicated pre and post contrast abdominal MRI. Alternatively, if  the patient is scheduled for follow-up PET, this would likely be informative. 3. Age advanced atherosclerosis. 4. Cholelithiasis. Electronically Signed   By: Abigail Miyamoto M.D.   On: 08/19/2015 16:34   Mr Shoulder Right W Wo Contrast  07/29/2015  CLINICAL DATA:  Breast cancer. Bone lesions. Right shoulder pain. Tenderness. EXAM: MRI OF THE RIGHT SHOULDER WITHOUT AND WITH CONTRAST TECHNIQUE: Multiplanar, multisequence MR imaging of the right shoulder was performed before and after the administration of intravenous contrast. CONTRAST:  95m MULTIHANCE GADOBENATE DIMEGLUMINE 529 MG/ML IV SOLN COMPARISON:  07/22/2015 FINDINGS: Despite efforts by the technologist and patient, motion artifact is present on today's exam and could not be eliminated. This reduces exam sensitivity and specificity. Rotator cuff:  Mild infraspinatus and subscapularis tendinopathy. Muscles: Low-level edema in the distal trapezius muscle. Low-level edema proximally in the deltoid muscle. Biceps long head:  Unremarkable Acromioclavicular Joint: 4.7 by 2.2 by 1.6 cm destructive expansile metastatic lesion in the distal clavicle with cortical irregularity and posterior cortical breakthrough, surrounding soft tissue enhancement, and edema in the adjacent trapezius and deltoid muscles. This tumor extends to the distal clavicular articular margin. Mild lateral downsloping of the acromion. Subacromial morphology is type 2 (curved). There is some edema along the coracoclavicular ligament. Glenohumeral Joint: Mild degenerative chondral thinning. Labrum:  Unremarkable Bones: Diffuse osseous metastatic disease. While the  destructive distal clavicular metastatic lesion is the most striking, there are numerous scattered metastatic lesions throughout the proximal humerus, scapula, and clavicle. These demonstrate high T2 signal and enhancement. Small left axillary lymph nodes. There probably metastatic lesions involving the adjacent ribs although the area of the ribs this signal poor. IMPRESSION: 1. Diffuse multifocal osseous metastatic disease in the visualized bony structures. The dominant lesion is a large destructive expansile distal clavicular metastatic lesion with surrounding soft tissue edema and enhancement and cortical destruction. 2. Mild infraspinatus and subscapularis tendinopathy. 3. Mildly laterally downsloping acromion may predispose to impingement. 4. Minimal degenerative chondral thinning in the glenohumeral joint. Electronically Signed   By: WVan ClinesM.D.   On: 07/29/2015 08:53   Ct Biopsy  08/13/2015  INDICATION: 45year old female with a history of breast cancer and multifocal osseous metastatic disease. She presents for biopsy to confirm tissue diagnosis. EXAM: CT BIOPSY MEDICATIONS: None. ANESTHESIA/SEDATION: Moderate (conscious) sedation was employed during this procedure. A total of Versed 2 mg and Fentanyl 100 mcg was administered intravenously. Moderate Sedation Time: 25 minutes. The patient's level of consciousness and vital signs were monitored continuously by radiology nursing throughout the procedure under my direct supervision. FLUOROSCOPY TIME:  None COMPLICATIONS: None immediate. Estimated blood loss: None PROCEDURE: Informed written consent was obtained from the patient after a thorough discussion of the procedural risks, benefits and alternatives. All questions were addressed. A timeout was performed prior to the initiation of the procedure. A planning axial CT scan was performed. The irregular permeative lytic lesion in the distal aspect of right clavicle was successfully identified. An  appropriate skin entry site was selected and marked. The region was then sterilely prepped and draped in standard fashion using chlorhexidine skin prep. Local anesthesia was attained by infiltration with 1% lidocaine. A small dermatotomy was made. Under intermittent CT fluoroscopic guidance an 11 change on Control drill cannula was advanced to the margin of the distal clavicle. Two core biopsy specimens were then obtained and placed in formalin to be delivered to pathology. Hemostasis was attained by manual pressure. The patient tolerated the procedure well. IMPRESSION: Successful CT-guided biopsy of permeative lytic  lesion involving the distal right clavicle. Signed, Criselda Peaches, MD [facility] Electronically Signed   By: Jacqulynn Cadet M.D.   On: 08/13/2015 09:24    ASSESSMENT: 45 y.o. BRCA negative  woman with a history of sarcoidosis and breast cancer as follows:  (1)  status post left breast and left axillary lymph node biopsy 10/03/2013, both positive for a clinical T2 N2, stage IIIA invasive ductal carcinoma, grade 2, estrogen and progesterone receptor positive, HER-2 negative, with an MIB-1 of 20%.  (2) Treated with neoadjuvant chemotherapy consisting of doxorubicin and cyclophosphamide in dose dense fashion x4, completed 12/11/2013, followed by paclitaxel weekly x12 (dose reduced by 20% at cycle 5 because of neuropathy concerns) completed 03/19/2014.  (3) status post left mastectomy and left axillary lymph node dissection 04/23/2014 for a pT1c pN2, stage IIIA invasive ductal carcinoma, grade 3, estrogen receptor 99% positive, progesterone receptor 4% positive, with no HER-2 amplification  (3) adjuvant radiation completed 08/11/2014 Left chest wall / 50.4 Gray @ 1.8 Gray per fraction x 28 fractions Left Supraclavicular fossa / 45 Gray '@1'$ .8 Gray per fraction x 25 fractions Left PAB / 45 Gy at 1.8 Gray per fraction x  25 fractions Left scar / 10 Gray at [employer] per fraction x 5 fractions  (4) tamoxifen started 10/10/14, discontinued March 2017 with evidence of metastatic recurrence  (5) current smoker. Attending quitting cessation therapy with the moviation that she must quit before she has a breast reduction.   (6) genetic testing sent 10/16/2013 did not reveal any deleterious mutation in any of these genes. The genes tested were ATM, BARD1, BRCA1, BRCA2, BRIP1, CDH1, CHEK2, MRE11A, MUTYH, NBN, NF1, PALB2, PTEN, RAD50, RAD51C, RAD51D, and TP53.  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 We cannot go up much more on the Percocet because of the Tylenol content. She can increase the Aleve to 2 tablets 3 times a day and I suggested she do that. However she needs a long-acting pain agents and for some reason her insurance has not approved either OxyContin or MS Contin.  Today I wrote her for methadone 5 mg to take 3 times a day. We discussed the possible toxicities, side effects and complications of this agent. She will see [place] again next Wednesday and we will review how well or hollow not well that is controlling the pain. If the pain persists as before we will increase the dose to 10 mg 3 times a day.  The palbociclib is causing leukopenia but the ANC is still above 1.0 so we are continuing through this cycle. We will recheck the counts next  Wednesday.  She never did get to meet with her dentist, but is not aware of any impending extractions. I think we need to start the zolendronate and today we reviewed the possible toxicities side effects and complications of that agent, including the rare case of osteonecrosis of the jaw.. I have scheduled her to receive the first dose on 09/01/2015. We will repeat this every 12 weeks.  After 3 cycles of palbociclib we will restage. Meanwhile I am obtaining plain films of the left tibial area today to make sure there is no impending fracture there.  She knows to call for any problems that may develop before her next visit here.  Chauncey Cruel, MD 08/27/2015 3:11 PM   ADDENDUM: CLINICAL DATA: Left tibia and fibular pain for 1 week  EXAM: LEFT TIBIA AND FIBULA - 2 VIEW  COMPARISON: None.  FINDINGS: No acute fracture. No dislocation. Unremarkable soft tissues. 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 There has been interval healing of the fracture at the tip of the lateral malleolus.  IMPRESSION: Healed avulsion fracture at the tip of the fibula. Old fracture at the tip of the lateral malleolus. No acute bony injury.   Electronically Signed  By: Marybelle Killings M.D.  On: 08/27/2015 16:59

## 2015-08-27 NOTE — Telephone Encounter (Signed)
appt made and avs printed. Pt to get xray today at [facility]

## 2015-08-29 LAB — ANGIOTENSIN CONVERTING ENZYME: Angio Convert Enzyme: 97 U/L — ABNORMAL HIGH (ref 14–82)

## 2015-08-30 ENCOUNTER — Ambulatory Visit
Admission: RE | Admit: 2015-08-30 | Discharge: 2015-08-30 | Disposition: A | Discharge status: Home / Self Care | Payer: Medicaid Other | Source: Ambulatory Visit | Attending: Radiation Oncology | Admitting: Radiation Oncology

## 2015-08-30 ENCOUNTER — Ambulatory Visit: Payer: Medicaid Other

## 2015-08-31 ENCOUNTER — Encounter: Payer: Self-pay | Admitting: Radiation Oncology

## 2015-08-31 ENCOUNTER — Ambulatory Visit
Admission: RE | Admit: 2015-08-31 | Discharge: 2015-08-31 | Disposition: A | Discharge status: Home / Self Care | Payer: Medicaid Other | Source: Ambulatory Visit | Attending: Radiation Oncology | Admitting: Radiation Oncology

## 2015-08-31 ENCOUNTER — Other Ambulatory Visit: Payer: Self-pay

## 2015-08-31 VITALS — BP 129/93 | HR 87 | Temp 97.7°F | Resp 16 | Ht 63.0 in | Wt 183.1 lb

## 2015-08-31 DIAGNOSIS — C50412 Malignant neoplasm of upper-outer quadrant of left female breast: Secondary | ICD-10-CM

## 2015-08-31 DIAGNOSIS — Z510 Encounter for antineoplastic radiation therapy: Secondary | ICD-10-CM | POA: Diagnosis not present

## 2015-08-31 DIAGNOSIS — C50912 Malignant neoplasm of unspecified site of left female breast: Secondary | ICD-10-CM

## 2015-08-31 NOTE — Progress Notes (Signed)
Weekly Management Note Current Dose:  18 Gy  Projected Dose: 30 Gy   Narrative:  The patient presents for routine under treatment assessment.  CBCT/MVCT images/Port film x-rays were reviewed.  The chart was checked. Pain increasing. Xrays negative. Processing diagnosis. Interested in putting a will together. Taking methadone with good results (better at night).  Physical Findings: Weight: 183 lb 1.6 oz (83.054 kg). In a wheelchair.   Impression:  The patient is tolerating radiation.  Plan:  Continue treatment as planned. Continue methadone. Will ask social work to follow up tomorrow. Follow up in 1 month.

## 2015-08-31 NOTE — Progress Notes (Signed)
Ms. Tona has received 6 fractions to her right shoulder, left femur and pelvis.  Skin to the treatment areas with no change using Sonafine as instructed.     Forcing heself to eat so she can take her pain medication.  Having fatigue when she does any activity.  Pain 5/10 taking Oxycodone and Methadone for pain control. BP 129/93 mmHg  Pulse 87  Temp(Src) 97.7 F (36.5 C) (Oral)  Resp 16  Ht 5\' 3"  (1.6 m)  Wt 183 lb 1.6 oz (83.054 kg)  BMI 32.44 kg/m2  SpO2 100%  LMP 12/07/2014

## 2015-09-01 ENCOUNTER — Encounter: Payer: Self-pay | Admitting: *Deleted

## 2015-09-01 ENCOUNTER — Encounter: Payer: Self-pay | Admitting: Nurse Practitioner

## 2015-09-01 ENCOUNTER — Ambulatory Visit (HOSPITAL_BASED_OUTPATIENT_CLINIC_OR_DEPARTMENT_OTHER): Payer: Medicaid Other | Admitting: Nurse Practitioner

## 2015-09-01 ENCOUNTER — Other Ambulatory Visit (HOSPITAL_BASED_OUTPATIENT_CLINIC_OR_DEPARTMENT_OTHER): Payer: Medicaid Other

## 2015-09-01 ENCOUNTER — Other Ambulatory Visit: Payer: Self-pay | Admitting: *Deleted

## 2015-09-01 ENCOUNTER — Ambulatory Visit (HOSPITAL_BASED_OUTPATIENT_CLINIC_OR_DEPARTMENT_OTHER): Payer: Medicaid Other

## 2015-09-01 ENCOUNTER — Telehealth: Payer: Self-pay | Admitting: Oncology

## 2015-09-01 ENCOUNTER — Ambulatory Visit
Admission: RE | Admit: 2015-09-01 | Discharge: 2015-09-01 | Disposition: A | Discharge status: Home / Self Care | Payer: Medicaid Other | Source: Ambulatory Visit | Attending: Radiation Oncology | Admitting: Radiation Oncology

## 2015-09-01 VITALS — BP 128/95 | HR 82 | Temp 97.5°F | Resp 18 | Ht 63.0 in | Wt 180.9 lb

## 2015-09-01 DIAGNOSIS — C50912 Malignant neoplasm of unspecified site of left female breast: Secondary | ICD-10-CM

## 2015-09-01 DIAGNOSIS — C50412 Malignant neoplasm of upper-outer quadrant of left female breast: Secondary | ICD-10-CM

## 2015-09-01 DIAGNOSIS — C7951 Secondary malignant neoplasm of bone: Secondary | ICD-10-CM

## 2015-09-01 DIAGNOSIS — D702 Other drug-induced agranulocytosis: Secondary | ICD-10-CM

## 2015-09-01 DIAGNOSIS — C773 Secondary and unspecified malignant neoplasm of axilla and upper limb lymph nodes: Secondary | ICD-10-CM | POA: Diagnosis not present

## 2015-09-01 DIAGNOSIS — G893 Neoplasm related pain (acute) (chronic): Principal | ICD-10-CM

## 2015-09-01 DIAGNOSIS — Z510 Encounter for antineoplastic radiation therapy: Secondary | ICD-10-CM | POA: Diagnosis not present

## 2015-09-01 DIAGNOSIS — Z170 Estrogen receptor positive status [ER+]: Secondary | ICD-10-CM

## 2015-09-01 LAB — CBC WITH DIFFERENTIAL/PLATELET
BASO%: 0.8 % (ref 0.0–2.0)
BASOS ABS: 0 10*3/uL (ref 0.0–0.1)
EOS ABS: 0 10*3/uL (ref 0.0–0.5)
EOS%: 1.6 % (ref 0.0–7.0)
HEMATOCRIT: 32.9 % — AB (ref 34.8–46.6)
HEMOGLOBIN: 10.8 g/dL — AB (ref 11.6–15.9)
LYMPH#: 0.4 10*3/uL — AB (ref 0.9–3.3)
LYMPH%: 28 % (ref 14.0–49.7)
MCH: 29 pg (ref 25.1–34.0)
MCHC: 32.8 g/dL (ref 31.5–36.0)
MCV: 88.2 fL (ref 79.5–101.0)
MONO#: 0.1 10*3/uL (ref 0.1–0.9)
MONO%: 4 % (ref 0.0–14.0)
NEUT%: 65.6 % (ref 38.4–76.8)
NEUTROS ABS: 0.8 10*3/uL — AB (ref 1.5–6.5)
Platelets: 120 10*3/uL — ABNORMAL LOW (ref 145–400)
RBC: 3.73 10*6/uL (ref 3.70–5.45)
RDW: 14.6 % — AB (ref 11.2–14.5)
WBC: 1.3 10*3/uL — AB (ref 3.9–10.3)

## 2015-09-01 LAB — COMPREHENSIVE METABOLIC PANEL
ALBUMIN: 3.8 g/dL (ref 3.5–5.0)
ALK PHOS: 105 U/L (ref 40–150)
ALT: 19 U/L (ref 0–55)
AST: 29 U/L (ref 5–34)
Anion Gap: 10 mEq/L (ref 3–11)
BUN: 13.4 mg/dL (ref 7.0–26.0)
CALCIUM: 9.9 mg/dL (ref 8.4–10.4)
CO2: 30 mEq/L — ABNORMAL HIGH (ref 22–29)
CREATININE: 1.1 mg/dL (ref 0.6–1.1)
Chloride: 100 mEq/L (ref 98–109)
EGFR: 68 mL/min/{1.73_m2} — ABNORMAL LOW (ref >90)
GLUCOSE: 104 mg/dL (ref 70–140)
POTASSIUM: 4.4 meq/L (ref 3.5–5.1)
Sodium: 140 mEq/L (ref 136–145)
TOTAL PROTEIN: 8.2 g/dL (ref 6.4–8.3)

## 2015-09-01 MED ORDER — ZOLEDRONIC ACID 4 MG/100ML IV SOLN
4.0000 mg | Freq: Once | INTRAVENOUS | Status: AC
  Administered 2015-09-01: 4 mg via INTRAVENOUS
  Filled 2015-09-01: qty 100

## 2015-09-01 MED ORDER — PALBOCICLIB 100 MG PO CAPS: 100.0000 mg | ORAL_CAPSULE | Freq: Every day | ORAL | Status: DC

## 2015-09-01 MED ORDER — SODIUM CHLORIDE 0.9 % IV SOLN
Freq: Once | INTRAVENOUS | Status: AC
  Administered 2015-09-01: 12:00:00 via INTRAVENOUS

## 2015-09-01 NOTE — Telephone Encounter (Signed)
appt made and avs printed °

## 2015-09-01 NOTE — Progress Notes (Signed)
[place] Work  Clinical Social Work was referred by P[employer] manager for assessment of psychosocial needs.  Clinical Social Worker met with patient at [facility], after her appt with Nira Conn to offer support and assess for needs. CSW explained role of CSW to pt. Pt very open to meeting with CSW and had several questions about ADRs and advanced life planning. Pt plans to come to [place] office after radiation tomorrow to discuss her plans in detail. CSW to follow and address concerns.   Clinical Social Work interventions: Resource education  Loren Racer, [place] Worker [place]  [place] Phone: [phone] Fax: [phone]

## 2015-09-01 NOTE — Patient Instructions (Signed)

## 2015-09-01 NOTE — Progress Notes (Signed)
[place]  [phone] [phone]    ID: [name] OB: 06-12-1970  MR#: [id number]  SEG#:[id number]  PCP: Elyn Peers, MD GYN:  [name] SU: [name] OTHER MD: Thea Silversmith  CHIEF COMPLAINT:  Left Breast Cancer, estrogen receptor positive  CURRENT TREATMENT: letrozole, palbociclib, [zolendronate]  BREAST CANCER HISTORY: From the original intake note:  Beryl palpated a mass in her left breast mid-April 2015 and immediately brought it to Dr. Benjie Karvonen' attention. She was set up for bilateral screening mammography with tomography at [facility] hospital 09/19/2013. The possible distortion in the left breast and a nearby group of calcifications was felt to warrant further evaluation, and on 09/30/2013 she underwent unilateral left diagnostic mammography and ultrasonography. There was an irregular mass in the outer left breast associated with microcalcifications. 4 cm anterior to this there was another cluster of pleomorphic calcifications spanning 7 mm. He had more anteriorly there was an irregular mass associated with other calcifications. In total the abnormalities in the left breast spent approximately 10 cm, and is segmental distribution. On exam there was a palpable firm mass at the 2:30 o'clock position of the left breast 10 cm from the nipple. There was palpable adenopathy in the inferior left axilla. Ultrasound confirmed an irregular hypoechoic mass in the left breast measuring 4.6 cm. In addition, a 1.4 cm oval slightly irregular mass was noted at the 3:00 position and yet another mass measured 9 mm in the same area. Ultrasound of the left axilla showed a dominant 3.6 cm lymph node.  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 (Because all 3 biopsies were morphologically identical, only one prognostic panel was sent).  On 10/10/2013 the patient underwent bilateral breast MRI. This showed numerous enhancing masses in the left breast, the largest measuring 3.5 cm, and the aggregate measuring 12.7 cm. There were numerous enlarged left axillary lymph nodes, at least 5 of which were enlarged, both at levels 1 and 2. The largest measured 2.3 cm. The right breast was negative.  The patient's subsequent history is as detailed below.  INTERVAL HISTORY: Amanda returns today for review of her stage IV breast cancer. She is on letrozole daily as well as 138m palbociclib, taken every 21 days and off for 7 days. Today is day 20. She tolerates this well with few complaints, maybe some fatigue.  REVIEW OF SYSTEMS: Audra's main complaint today is pain. It improved since her last visit when she was started on 51mmethadone TID. She continues on 1-2 tabs of percocet about 3-4 times daily and naproxen TID. At best her pain is 4-5/10. Her pain concentrates on her right scapula and lower back. The pain to her left calf is gone. She is not constipated. She denies nausea or vomiting. Her appetite is down. She has lost 9lb since her last visit. She is fatigued. She is anxious. A detailed review of systems is otherwise stable.  PAST MEDICAL HISTORY: Past Medical History  Diagnosis Date  . Hypertension   . Sarcoidosis ([place]    no problems per patient   . Anemia   . Complication of anesthesia     makes pt. restless and unable to be still  . High cholesterol   .  Thyroid cancer ([place]) 2015  . Type II diabetes mellitus ([place])   . Breast cancer ([place]) 2015    left upper outer;  had chemo  . Radiation 06/23/14-08/11/14    Invasive ductal ca o left breast  . SVD (spontaneous vaginal delivery)     x 2  . Heart murmur     never had any problems as adult  . Bronchitis  2014  . Depression     no meds currently  . Anxiety     no meds currently  . GERD (gastroesophageal reflux disease)     PAST SURGICAL HISTORY: Past Surgical History  Procedure Laterality Date  . Tubal ligation  2001  . Endobronchial ultrasound Bilateral 12/02/2012    Procedure: ENDOBRONCHIAL ULTRASOUND;  Surgeon: Collene Gobble, MD;  Location: [facility];  Service: Cardiopulmonary;  Laterality: Bilateral;  . Portacath placement N/A 10/22/2013    Procedure: INSERTION PORT-A-CATH;  Surgeon: [name], MD;  Location: [facility];  Service: General;  Laterality: N/A;  . Lung biopsy  2014  . Mastectomy w/ sentinel node biopsy Left 04/23/2014    & axillary LND  . Mastectomy w/ sentinel node biopsy Left 04/23/2014    Procedure: LEFT BREAST MASTECTOMY WITH SENTINEL LYMPH NODE BIOPSY AND AXILLARY LYMPH NODE DISECTION;  Surgeon: [name], MD;  Location: [place] [facility];  Service: General;  Laterality: Left;  . Thyroid lobectomy Left 04/23/2014    Procedure: LEFT THYROID LOBECTOMY;  Surgeon: [name], MD;  Location: [place];  Service: General;  Laterality: Left;  . Breast biopsy Left 2015 X 3    biopsy  . Wisdom tooth extraction    . Leep    . Laparoscopic assisted vaginal hysterectomy N/A 12/09/2014    Procedure: LAPAROSCOPIC ASSISTED VAGINAL HYSTERECTOMY;  Surgeon: Cheri Fowler, MD;  Location: [facility];  Service: Gynecology;  Laterality: N/A;  . Salpingoophorectomy Bilateral 12/09/2014    Procedure: SALPINGO OOPHORECTOMY;  Surgeon: Cheri Fowler, MD;  Location: [facility];  Service: Gynecology;  Laterality: Bilateral;  . Abdominal hysterectomy    . Port-a-cath removal Left 02/03/2015    Procedure: REMOVAL PORT-A-CATH;  Surgeon: [name], MD;  Location: [facility];  Service: General;  Laterality: Left;    FAMILY HISTORY Family History  Problem Relation Age of Onset  . Cancer Paternal Aunt     "bone cancer"; deceased 76s  . Cancer Paternal Uncle     stomach cancer; deceased 63s  .  Cancer Paternal Aunt     unk. primary; currently 84s  . Prostate cancer Maternal Grandfather 71  . Asthma Mother   The patient's parents are living, in fair mid to late 24s. The patient had one brother, no sisters. There is no history of breast or ovarian cancer in the family to her knowledge.   GYNECOLOGIC HISTORY:   (Reviewed 11/27/2013)  menarche age 53, first live birth at 35. She is GX P2. She was still having regular periods at the time of the start of her chemotherapy in May 2015.  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 She is single and lives alone, although her mother is currently staying with her. Her son Mckenleigh Tarlton lives in [place] and works at [employer]. Son [name] lives in [place] where he works at a [employer]. The patient has 1 grandchild born in Sept 2015, by her son, [name]. She is a Psychologist, forensic.    ADVANCED DIRECTIVES: not in place   HEALTH MAINTENANCE: (Updated 11/27/2013)  Social History  Substance Use Topics  . Smoking status: Current Some Day Smoker -- 0.25 packs/day for 25 years    Types: Cigarettes  . Smokeless tobacco: Never Used     Comment: 04/23/2014 "using vapor; down to 3-4 cigarettes/day now"  . Alcohol Use: 6.0 oz/week    4 Cans of beer, 4 Shots of liquor, 2 Standard drinks or equivalent per week     Colonoscopy: Never  PAP: Not on file  Bone density: Not on file  Lipid panel:  Not on file  Allergies  Allergen Reactions  . Nyquil Multi-Symptom [Pseudoeph-Doxylamine-Dm-Apap] Itching and Other (See Comments)    Skin peels on hands, and feet    Current Outpatient Prescriptions  Medication Sig Dispense Refill  . CHLORTHALIDONE PO Take 1 tablet by mouth daily.    . ferrous sulfate 325 (65 FE) MG tablet Take 325 mg by mouth daily with breakfast.     . FLUoxetine (PROZAC) 20 MG tablet Take 20 mg by mouth every morning.    . gabapentin (NEURONTIN) 300 MG capsule Take 2  capsules (600 mg total) by mouth 2 (two) times daily. (Patient taking differently: Take 300-600 mg by mouth 3 (three) times daily. Take 334m in the morning and afternoon and 6069min the evening) 120 capsule 5  . letrozole (FEMARA) 2.5 MG tablet Take 1 tablet (2.5 mg total) by mouth daily. 90 tablet 4  . metFORMIN (GLUCOPHAGE) 1000 MG tablet Take 1,000 mg by mouth 2 (two) times daily with a meal.     . methadone (DOLOPHINE) 5 MG tablet Take 1 tablet (5 mg total) by mouth every 8 (eight) hours. 90 tablet 0  . Multiple Vitamin (MULTIVITAMIN WITH MINERALS) TABS tablet Take 1 tablet by mouth daily.    . naproxen (NAPROSYN) 500 MG tablet Take 1 tablet (500 mg total) by mouth 3 (three) times daily with meals. 90 tablet 3  . oxyCODONE-acetaminophen (PERCOCET/ROXICET) 5-325 MG tablet Take 1 tablet by mouth every 6 (six) hours as needed for severe pain. 60 tablet 0  . palbociclib (IBRANCE) 125 MG capsule Take 1 capsule (125 mg total) by mouth daily with breakfast. Take whole with food. 21 capsule 3  . albuterol (PROVENTIL HFA;VENTOLIN HFA) 108 (90 BASE) MCG/ACT inhaler Inhale 2 puffs into the lungs every 6 (six) hours as needed for wheezing. Reported on 09/01/2015     No current facility-administered medications for this visit.    OBJECTIVE: Middle-aged AfSerbiamerican woman In significant distress Filed Vitals:   09/01/15 1022  BP: 128/95  Pulse: 82  Temp: 97.5 F (36.4 C)  Resp: 18     Body mass index is 32.05 kg/(m^2).    ECOG FS:1 - Symptomatic but completely ambulatory  Skin: warm, dry  HEENT: sclerae anicteric, conjunctivae pink, oropharynx clear. No thrush or mucositis.  Lymph Nodes: No cervical or supraclavicular lymphadenopathy  Lungs: clear to auscultation bilaterally, no rales, wheezes, or rhonci  Heart: regular rate and rhythm  Abdomen: round, soft, non tender, positive bowel sounds  Musculoskeletal: tenderness to lower back, decrease range of motion of upper limbs Neuro: non focal,  well oriented, positive affect  Breasts: deferred  LAB RESULTS:   Lab Results  Component Value Date   WBC 1.3* 09/01/2015   NEUTROABS 0.8* 09/01/2015   HGB 10.8* 09/01/2015   HCT 32.9* 09/01/2015   MCV 88.2 09/01/2015   PLT 120* 09/01/2015      Chemistry      Component Value Date/Time   NA 140 09/01/2015  1009   NA 140 08/13/2015 0635   K 4.4 09/01/2015 1009   K 3.7 08/13/2015 0635   CL 106 08/13/2015 0635   CO2 30* 09/01/2015 1009   CO2 21* 08/13/2015 0635   BUN 13.4 09/01/2015 1009   BUN 9 08/13/2015 0635   CREATININE 1.1 09/01/2015 1009   CREATININE 0.98 08/13/2015 0635      Component Value Date/Time   CALCIUM 9.9 09/01/2015 1009   CALCIUM 10.0 08/13/2015 0635   ALKPHOS 105 09/01/2015 1009   ALKPHOS 76 12/02/2014 1509   AST 29 09/01/2015 1009   AST 24 12/02/2014 1509   ALT 19 09/01/2015 1009   ALT 23 12/02/2014 1509   BILITOT <0.30 09/01/2015 1009   BILITOT 0.2* 12/02/2014 1509      STUDIES: Dg Tibia/fibula Left  08/27/2015  CLINICAL DATA:  Left tibia and fibular pain for 1 week EXAM: LEFT TIBIA AND FIBULA - 2 VIEW COMPARISON:  None. FINDINGS: No acute fracture. No dislocation. Unremarkable soft tissues. 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 There has been interval healing of the fracture at the tip of the lateral malleolus. IMPRESSION: Healed avulsion fracture at the tip of the fibula. Old fracture at the tip of the lateral malleolus. No acute bony injury. Electronically Signed   By: Marybelle Killings M.D.   On: 08/27/2015 16:59   Ct Chest W Contrast  08/03/2015  CLINICAL DATA:  History of left breast cancer. History of sarcoidosis per electronic medical records. Status post chemotherapy completed in October 2015 and radiation therapy completed in March 2016, currently on tamoxifen. EXAM: CT CHEST WITH CONTRAST TECHNIQUE: Multidetector CT imaging of the chest was performed during intravenous contrast administration. CONTRAST:  4m  OMNIPAQUE IOHEXOL 300 MG/ML  SOLN COMPARISON:  07/22/2015 chest CT. FINDINGS: Mediastinum/Nodes: Normal heart size. No pericardial fluid/thickening. Great vessels are normal in course and caliber. No central pulmonary emboli. Status post left hemithyroidectomy with normal appearance of the visualized remnant right thyroid lobe. Normal esophagus. Multiple left axillary surgical clips. No axillary adenopathy. Moderate bilateral paratracheal, AP window, subcarinal and bilateral hilar lymphadenopathy is not appreciably changed back to 10/20/2013. For example a 1.6 cm right paratracheal node (series 3/ image 18) measured 1.5 cm on 10/20/2013, not appreciably changed. A 2.1 cm subcarinal node (series 3/ image 27) previously measured 2.1 cm on 10/20/2013, unchanged. A 1.9 cm AP window node (series 3/ image 19) previously measured 1.8 cm, not appreciably changed. A 1.5 cm right infrahilar node (series 3/ image 31) previously measured 1.5 cm, unchanged. A 1.4 cm left infrahilar node (series 3/ image 30) previously measured 1.4 cm, unchanged. No new pathologically enlarged mediastinal or hilar nodes. Lungs/Pleura: No pneumothorax. No residual pleural effusion. Mild centrilobular emphysema. Sharply marginated mild subpleural reticulation in the anterior left upper lobe is in keeping with mild radiation fibrosis. Subpleural 5 mm pulmonary nodule in the posterior left upper lobe (series 6/ image 12) is stable since 10/28/2012 and benign. Right middle lobe 3 mm pulmonary nodule (series 6/ image 36) is stable since 10/28/2012 and benign. Subpleural anterior left lower lobe 3 mm pulmonary nodule associated with the major fissure (series 6/image 29) is stable since 10/28/2012 and benign. There mild patchy ground-glass opacities in the right greater than left upper lobes and superior segment right lower lobe, significantly decreased since 07/22/2015, suggesting a resolving inflammatory process. No acute consolidative airspace  disease, new significant pulmonary nodules or lung masses. Upper abdomen: There is a 1.9 cm heterogeneously enhancing  lateral segment left liver lobe mass (series 3/ image 43), stable since 10/28/2012, in keeping with a benign hemangioma. There is a 1.5 cm hypodense left liver dome mass (series 3/ image 41), stable since 10/20/2013 PET-CT, where it was non FDG avid, in keeping with a benign liver lesion. Musculoskeletal: There are innumerable small lytic osseous lesions scattered throughout the lower cervical spine, entire thoracic spine and right lower scapula, which are all new since 10/20/2013 and are worrisome for osseous metastases. IMPRESSION: 1. Innumerable small lytic osseous lesions scattered throughout the visualized cervicothoracic spine and right lower scapula, all new since 10/20/2013, most worrisome for osseous metastases. 2. No additional sites of definite metastatic disease in the chest. Moderate mediastinal and bilateral hilar lymphadenopathy is not appreciably changed back to 10/20/2013 and most compatible with sarcoidosis. 3. Mild patchy ground-glass opacity in the right greater than left upper lungs, significantly decreased since 07/22/2015, suggesting a resolving inflammatory process. 4. Mild centrilobular emphysema. Electronically Signed   By: Ilona Sorrel M.D.   On: 08/03/2015 16:07   Ct Abdomen Pelvis W Contrast  08/19/2015  CLINICAL DATA:  Breast cancer diagnosed 5/15. Chemotherapy and radiation therapy completed in October of 2015 and March 2016 respectively. Left mastectomy. EXAM: CT ABDOMEN AND PELVIS WITH CONTRAST TECHNIQUE: Multidetector CT imaging of the abdomen and pelvis was performed using the standard protocol following bolus administration of intravenous contrast. CONTRAST:  133m OMNIPAQUE IOHEXOL 300 MG/ML  SOLN COMPARISON:  Chest CTs, most recent 08/03/2015. PET 10/20/2013. No prior dedicated abdominal pelvic CTs. FINDINGS: Lower chest: 3 mm right middle lobe pulmonary  nodule image 5/series 4 similar to the most recent chest CT. There is also a 2 mm subpleural right lower lobe pulmonary nodule on image 6/series 4 which is unchanged. A nodule along the left major fissure is similar at 3 mm on image 2/series 4. Normal heart size without pericardial or pleural effusion. Lower thoracic adenopathy has been detailed on recent chest CTs. Hepatobiliary: High left hepatic lobe hypo attenuating lesions, including on images 11 and 13/ series 2 are present on 10/20/2013 and likely represent hemangiomas. A high segment 4A hypo attenuating lesion measures 1.3 cm on image 14/series 2. Not readily apparent on the prior chest CT of 10/20/2013 or PET of 10/20/2013. There is also vague heterogeneous attenuation involving the right lobe of the liver including on image 25/series 2. These areas have not been evaluated on contrast-enhanced studies previously. Small gallstone without acute cholecystitis or biliary duct dilatation. Pancreas: Normal, without mass or ductal dilatation. Spleen: Normal in size, without focal abnormality. Adrenals/Urinary Tract: Normal right adrenal gland. Minimal left adrenal thickening. No hydronephrosis. Normal urinary bladder. Stomach/Bowel: Proximal gastric underdistention. Otherwise normal stomach. Normal colon, appendix, and terminal ileum. Normal small bowel. Vascular/Lymphatic: Age advanced aortic and branch vessel atherosclerosis. No abdominopelvic adenopathy. Reproductive: Hysterectomy.  No adnexal mass. Other: No significant free fluid. No evidence of omental or peritoneal disease. Musculoskeletal: Innumerable lucent lesions throughout the pelvis and lumbar spine. Example in the left iliac at 2.8 cm on image 58/ series 2. No complicating compression deformity. IMPRESSION: 1. Widespread osseous metastasis, as on prior chest imaging. 2. High left hepatic lobe hypo attenuating lesions are similar back to 2015 and likely benign hemangiomas. A segment 4A lesion is not  readily apparent on prior exams. In addition, vague heterogeneous density/enhancement is seen throughout the remainder of the liver. Cannot exclude hepatic metastasis or less likely hepatic sarcoidosis. Consider dedicated pre and post contrast abdominal MRI. Alternatively, if the patient is  scheduled for follow-up PET, this would likely be informative. 3. Age advanced atherosclerosis. 4. Cholelithiasis. Electronically Signed   By: Abigail Miyamoto M.D.   On: 08/19/2015 16:34   Ct Biopsy  08/13/2015  INDICATION: 45 year old female with a history of breast cancer and multifocal osseous metastatic disease. She presents for biopsy to confirm tissue diagnosis. EXAM: CT BIOPSY MEDICATIONS: None. ANESTHESIA/SEDATION: Moderate (conscious) sedation was employed during this procedure. A total of Versed 2 mg and Fentanyl 100 mcg was administered intravenously. Moderate Sedation Time: 25 minutes. The patient's level of consciousness and vital signs were monitored continuously by radiology nursing throughout the procedure under my direct supervision. FLUOROSCOPY TIME:  None COMPLICATIONS: None immediate. Estimated blood loss: None PROCEDURE: Informed written consent was obtained from the patient after a thorough discussion of the procedural risks, benefits and alternatives. All questions were addressed. A timeout was performed prior to the initiation of the procedure. A planning axial CT scan was performed. The irregular permeative lytic lesion in the distal aspect of right clavicle was successfully identified. An appropriate skin entry site was selected and marked. The region was then sterilely prepped and draped in standard fashion using chlorhexidine skin prep. Local anesthesia was attained by infiltration with 1% lidocaine. A small dermatotomy was made. Under intermittent CT fluoroscopic guidance an 11 change on Control drill cannula was advanced to the margin of the distal clavicle. Two core biopsy specimens were then  obtained and placed in formalin to be delivered to pathology. Hemostasis was attained by manual pressure. The patient tolerated the procedure well. IMPRESSION: Successful CT-guided biopsy of permeative lytic lesion involving the distal right clavicle. Signed, Criselda Peaches, MD [facility] Electronically Signed   By: Jacqulynn Cadet M.D.   On: 08/13/2015 09:24    ASSESSMENT: 45 y.o. BRCA negative [place] woman with a history of sarcoidosis and breast cancer as follows:  (1)  status post left breast and left axillary lymph node biopsy 10/03/2013, both positive for a clinical T2 N2, stage IIIA invasive ductal carcinoma, grade 2, estrogen and progesterone receptor positive, HER-2 negative, with an MIB-1 of 20%.  (2) Treated with neoadjuvant chemotherapy consisting of doxorubicin and cyclophosphamide in dose dense fashion x4, completed 12/11/2013, followed by paclitaxel weekly x12 (dose reduced by 20% at cycle 5 because of neuropathy concerns) completed 03/19/2014.  (3) status post left mastectomy and left axillary lymph node dissection 04/23/2014 for a pT1c pN2, stage IIIA invasive ductal carcinoma, grade 3, estrogen receptor 99% positive, progesterone receptor 4% positive, with no HER-2 amplification  (3) adjuvant radiation completed 08/11/2014 Left chest wall / 50.4 Gray @ 1.8 Gray per fraction x 28 fractions Left Supraclavicular fossa / 45 Gray _0 .8 Gray per fraction x 25 fractions Left PAB / 45 Gy at 1.8 Gray per fraction x 25 fractions Left scar / 10 Gray at [employer] per fraction x 5 fractions  (4) tamoxifen started 10/10/14, discontinued March 2017 with evidence of metastatic recurrence  (5) current smoker. Attending quitting cessation therapy with the moviation that she must quit before she has a breast reduction.   (6) genetic testing sent 10/16/2013 did not reveal any deleterious mutation in any of these genes. The genes  tested were ATM, BARD1, BRCA1, BRCA2, BRIP1, CDH1, CHEK2, MRE11A, MUTYH, NBN, NF1, PALB2, PTEN, RAD50, RAD51C, RAD51D, and TP53.  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 She will not take today's or tomorrow's dose of palbociclib. She will begin her 7 days rest now, and we will recheck labs in 1 week. When she restarts this drug, it will be at the reduced dose of 1051m and this was sent into her pharmacy today.   Sharea's pain is certainly better, but there is room for improvement. We are going to increase her methadone to 1053mID. She will finish out her 5mg60mblets by doubling up on those, then we will give her a new prescription. Her mobility is poor. I have given her the form for a disability parking sticker. I am also having a cane, wheelchair, and shower chair delivered to her home. She will continue on disability and those forms have already been sent out.   She will go to radiation after our visit, then come back upstairs to receive her first dose of zometa. This will be given every 3 months.   JuanCharlital return in April for follow up with Dr. MagrJana Hakime understands and agrees with this plan. She knows the goal of treatment in her case is control. She has been encouraged to call with any issues that might arise before her next visit here.   HeatLaurie Panda 09/01/2015 1:50 PM

## 2015-09-02 ENCOUNTER — Other Ambulatory Visit: Payer: Self-pay | Admitting: *Deleted

## 2015-09-02 ENCOUNTER — Ambulatory Visit
Admission: RE | Admit: 2015-09-02 | Discharge: 2015-09-02 | Disposition: A | Discharge status: Home / Self Care | Payer: Medicaid Other | Source: Ambulatory Visit | Attending: Radiation Oncology | Admitting: Radiation Oncology

## 2015-09-02 DIAGNOSIS — G893 Neoplasm related pain (acute) (chronic): Principal | ICD-10-CM

## 2015-09-02 DIAGNOSIS — Z510 Encounter for antineoplastic radiation therapy: Secondary | ICD-10-CM | POA: Diagnosis not present

## 2015-09-02 DIAGNOSIS — C7951 Secondary malignant neoplasm of bone: Secondary | ICD-10-CM

## 2015-09-02 NOTE — Progress Notes (Signed)
Ordered a shower chair, wheelchair and cane for pt's use at home to help pt be able to do ADL'S without some much effort. Pt has increased pain to right shoulder, pelvis, leg area and lower back d/t metastatic cancer to the bone.

## 2015-09-03 ENCOUNTER — Ambulatory Visit: Payer: Medicaid Other

## 2015-09-03 ENCOUNTER — Ambulatory Visit
Admission: RE | Admit: 2015-09-03 | Discharge: 2015-09-03 | Disposition: A | Discharge status: Home / Self Care | Payer: Medicaid Other | Source: Ambulatory Visit | Attending: Radiation Oncology | Admitting: Radiation Oncology

## 2015-09-03 DIAGNOSIS — Z510 Encounter for antineoplastic radiation therapy: Secondary | ICD-10-CM | POA: Diagnosis not present

## 2015-09-03 MED FILL — IBRANCE 100 MG CAPSULE: 100 | 28 days supply | Qty: 21 | Fill #0

## 2015-09-06 ENCOUNTER — Encounter: Payer: Self-pay | Admitting: *Deleted

## 2015-09-06 ENCOUNTER — Ambulatory Visit: Payer: Medicaid Other

## 2015-09-06 ENCOUNTER — Ambulatory Visit
Admission: RE | Admit: 2015-09-06 | Discharge: 2015-09-06 | Disposition: A | Discharge status: Home / Self Care | Payer: Medicaid Other | Source: Ambulatory Visit | Attending: Radiation Oncology | Admitting: Radiation Oncology

## 2015-09-06 ENCOUNTER — Encounter: Payer: Self-pay | Admitting: Radiation Oncology

## 2015-09-06 DIAGNOSIS — Z510 Encounter for antineoplastic radiation therapy: Secondary | ICD-10-CM | POA: Diagnosis not present

## 2015-09-06 NOTE — Progress Notes (Signed)
[place] Work  Clinical Social Work phoned pt as pt did not come to see CSW on 09/02/15 as scheduled. Pt shared she was exhausted over the phone and really struggling to meet her ADLs. Her mother is helping her some, but "it's not really working out". Pt open to [facility] referral and CSW will get RN team to assist with this process. Pt plans to come see CSW after labs on Wednesday to work on [place] and end of life planning.  CSW will assist as needed.   Clinical Social Work interventions: Resource assistance  Loren Racer, [place] Worker [place]  [place] Phone: [phone] Fax: [phone]

## 2015-09-07 ENCOUNTER — Other Ambulatory Visit: Payer: Self-pay

## 2015-09-07 DIAGNOSIS — C50412 Malignant neoplasm of upper-outer quadrant of left female breast: Secondary | ICD-10-CM

## 2015-09-08 ENCOUNTER — Other Ambulatory Visit (HOSPITAL_BASED_OUTPATIENT_CLINIC_OR_DEPARTMENT_OTHER): Payer: Medicaid Other

## 2015-09-08 ENCOUNTER — Encounter: Payer: Self-pay | Admitting: Nutrition

## 2015-09-08 ENCOUNTER — Encounter: Payer: Self-pay | Admitting: Skilled Nursing Facility1

## 2015-09-08 DIAGNOSIS — C50412 Malignant neoplasm of upper-outer quadrant of left female breast: Secondary | ICD-10-CM | POA: Diagnosis present

## 2015-09-08 LAB — COMPREHENSIVE METABOLIC PANEL
ALBUMIN: 3.8 g/dL (ref 3.5–5.0)
ALK PHOS: 111 U/L (ref 40–150)
ALT: 21 U/L (ref 0–55)
AST: 35 U/L — AB (ref 5–34)
Anion Gap: 9 mEq/L (ref 3–11)
BUN: 11.4 mg/dL (ref 7.0–26.0)
CO2: 27 meq/L (ref 22–29)
Calcium: 8.6 mg/dL (ref 8.4–10.4)
Chloride: 100 mEq/L (ref 98–109)
Creatinine: 1 mg/dL (ref 0.6–1.1)
EGFR: 83 mL/min/{1.73_m2} — ABNORMAL LOW (ref >90)
GLUCOSE: 148 mg/dL — AB (ref 70–140)
POTASSIUM: 3.6 meq/L (ref 3.5–5.1)
SODIUM: 135 meq/L — AB (ref 136–145)
Total Bilirubin: <0.3 mg/dL (ref 0.20–1.20)
Total Protein: 8.4 g/dL — ABNORMAL HIGH (ref 6.4–8.3)

## 2015-09-08 LAB — CBC WITH DIFFERENTIAL/PLATELET
BASO%: 0.7 % (ref 0.0–2.0)
Basophils Absolute: 0 10*3/uL (ref 0.0–0.1)
EOS%: 3.5 % (ref 0.0–7.0)
Eosinophils Absolute: 0.1 10*3/uL (ref 0.0–0.5)
HCT: 32.6 % — ABNORMAL LOW (ref 34.8–46.6)
HGB: 10.9 g/dL — ABNORMAL LOW (ref 11.6–15.9)
LYMPH%: 15.5 % (ref 14.0–49.7)
MCH: 29.5 pg (ref 25.1–34.0)
MCHC: 33.4 g/dL (ref 31.5–36.0)
MCV: 88.1 fL (ref 79.5–101.0)
MONO#: 0.2 10*3/uL (ref 0.1–0.9)
MONO%: 15.5 % — ABNORMAL HIGH (ref 0.0–14.0)
NEUT#: 0.9 10*3/uL — ABNORMAL LOW (ref 1.5–6.5)
NEUT%: 64.8 % (ref 38.4–76.8)
Platelets: 109 10*3/uL — ABNORMAL LOW (ref 145–400)
RBC: 3.7 10*6/uL (ref 3.70–5.45)
RDW: 15.6 % — ABNORMAL HIGH (ref 11.2–14.5)
WBC: 1.4 10*3/uL — ABNORMAL LOW (ref 3.9–10.3)
lymph#: 0.2 10*3/uL — ABNORMAL LOW (ref 0.9–3.3)

## 2015-09-08 MED FILL — OxyCONTIN 10 MG T12A: 10 | 30 days supply | Qty: 60 | Fill #0

## 2015-09-08 NOTE — Progress Notes (Signed)
Subjective:     Patient ID: Shelly Hall, female   DOB: 1970-06-12, 45 y.o.   MRN: [id number]  HPI   Review of Systems     Objective:   Physical Exam To assist the pt identifiying dietary strategies to gain lost wt.    Assessment:     Pt identified as being malnourished due to lost wt. Pt contacted via telephone at [phone] with no answer. Another number provided, [phone] was called with success. Pt states she has lost about 5 pounds in one week. Pt states she has no appetite. Pt states she is in a lot of general pain and cannot stand to cook. Pt states she only has her mother helping her and she cannot stand very long either. Pt states she only has a certain amount of money in food stamps.    Plan:     Dietitian educated the pt on local food pantries. Dietitian also educated the pt on prepared foods for ease of feeding herself. Dietitian will send nutrition information to the pt's physical address.

## 2015-09-08 NOTE — Progress Notes (Unsigned)
Provided one complimentary case of Ensure plus. 

## 2015-09-13 ENCOUNTER — Telehealth: Payer: Self-pay

## 2015-09-13 ENCOUNTER — Other Ambulatory Visit: Payer: Self-pay | Admitting: Nurse Practitioner

## 2015-09-13 NOTE — Telephone Encounter (Signed)
Patient calling for results from her labs drawn on 09/08/15 to see if should start back on Ibrance.  Writer spoke with Nira Conn, NP- at this time patient cannot start back on her Ibrance due to her blood count.  Patient is to have labs drawn again on 09/15/15.

## 2015-09-14 ENCOUNTER — Other Ambulatory Visit: Payer: Self-pay | Admitting: *Deleted

## 2015-09-14 DIAGNOSIS — C50412 Malignant neoplasm of upper-outer quadrant of left female breast: Secondary | ICD-10-CM

## 2015-09-15 ENCOUNTER — Other Ambulatory Visit: Payer: Self-pay | Admitting: Nurse Practitioner

## 2015-09-15 ENCOUNTER — Other Ambulatory Visit (HOSPITAL_BASED_OUTPATIENT_CLINIC_OR_DEPARTMENT_OTHER): Payer: Medicaid Other

## 2015-09-15 DIAGNOSIS — C50412 Malignant neoplasm of upper-outer quadrant of left female breast: Secondary | ICD-10-CM | POA: Diagnosis present

## 2015-09-15 LAB — CBC WITH DIFFERENTIAL/PLATELET
BASO%: 0.3 % (ref 0.0–2.0)
Basophils Absolute: 0 10*3/uL (ref 0.0–0.1)
EOS%: 3.5 % (ref 0.0–7.0)
Eosinophils Absolute: 0.1 10*3/uL (ref 0.0–0.5)
HCT: 30.4 % — ABNORMAL LOW (ref 34.8–46.6)
HEMOGLOBIN: 10 g/dL — AB (ref 11.6–15.9)
LYMPH%: 7.7 % — AB (ref 14.0–49.7)
MCH: 29.5 pg (ref 25.1–34.0)
MCHC: 32.9 g/dL (ref 31.5–36.0)
MCV: 89.7 fL (ref 79.5–101.0)
MONO#: 0.6 10*3/uL (ref 0.1–0.9)
MONO%: 18.9 % — AB (ref 0.0–14.0)
NEUT%: 69.6 % (ref 38.4–76.8)
NEUTROS ABS: 2.4 10*3/uL (ref 1.5–6.5)
Platelets: 185 10*3/uL (ref 145–400)
RBC: 3.39 10*6/uL — AB (ref 3.70–5.45)
RDW: 16.5 % — AB (ref 11.2–14.5)
WBC: 3.4 10*3/uL — AB (ref 3.9–10.3)
lymph#: 0.3 10*3/uL — ABNORMAL LOW (ref 0.9–3.3)

## 2015-09-15 LAB — COMPREHENSIVE METABOLIC PANEL
ALT: 18 U/L (ref 0–55)
AST: 29 U/L (ref 5–34)
Albumin: 3.6 g/dL (ref 3.5–5.0)
Alkaline Phosphatase: 141 U/L (ref 40–150)
Anion Gap: 7 mEq/L (ref 3–11)
BUN: 21.1 mg/dL (ref 7.0–26.0)
CO2: 28 meq/L (ref 22–29)
CREATININE: 1.2 mg/dL — AB (ref 0.6–1.1)
Calcium: 9.4 mg/dL (ref 8.4–10.4)
Chloride: 102 mEq/L (ref 98–109)
EGFR: 60 mL/min/{1.73_m2} — ABNORMAL LOW (ref >90)
GLUCOSE: 91 mg/dL (ref 70–140)
Potassium: 4.3 mEq/L (ref 3.5–5.1)
SODIUM: 137 meq/L (ref 136–145)
TOTAL PROTEIN: 7.6 g/dL (ref 6.4–8.3)

## 2015-09-18 ENCOUNTER — Encounter (HOSPITAL_COMMUNITY): Payer: Self-pay

## 2015-09-18 ENCOUNTER — Inpatient Hospital Stay (HOSPITAL_COMMUNITY)
Admission: EM | Admit: 2015-09-18 | Discharge: 2015-09-21 | DRG: 948 | Disposition: A | Discharge status: Home Health | Payer: Medicaid Other | Attending: Internal Medicine | Admitting: Internal Medicine

## 2015-09-18 DIAGNOSIS — Z79899 Other long term (current) drug therapy: Secondary | ICD-10-CM

## 2015-09-18 DIAGNOSIS — F419 Anxiety disorder, unspecified: Secondary | ICD-10-CM | POA: Diagnosis present

## 2015-09-18 DIAGNOSIS — Z800 Family history of malignant neoplasm of digestive organs: Secondary | ICD-10-CM

## 2015-09-18 DIAGNOSIS — C50412 Malignant neoplasm of upper-outer quadrant of left female breast: Secondary | ICD-10-CM | POA: Diagnosis present

## 2015-09-18 DIAGNOSIS — D869 Sarcoidosis, unspecified: Secondary | ICD-10-CM | POA: Diagnosis present

## 2015-09-18 DIAGNOSIS — M25552 Pain in left hip: Secondary | ICD-10-CM | POA: Diagnosis not present

## 2015-09-18 DIAGNOSIS — T451X5A Adverse effect of antineoplastic and immunosuppressive drugs, initial encounter: Secondary | ICD-10-CM | POA: Diagnosis present

## 2015-09-18 DIAGNOSIS — Z79811 Long term (current) use of aromatase inhibitors: Secondary | ICD-10-CM

## 2015-09-18 DIAGNOSIS — C801 Malignant (primary) neoplasm, unspecified: Secondary | ICD-10-CM | POA: Diagnosis not present

## 2015-09-18 DIAGNOSIS — K219 Gastro-esophageal reflux disease without esophagitis: Secondary | ICD-10-CM | POA: Diagnosis present

## 2015-09-18 DIAGNOSIS — F17210 Nicotine dependence, cigarettes, uncomplicated: Secondary | ICD-10-CM | POA: Diagnosis present

## 2015-09-18 DIAGNOSIS — Z825 Family history of asthma and other chronic lower respiratory diseases: Secondary | ICD-10-CM

## 2015-09-18 DIAGNOSIS — Z515 Encounter for palliative care: Secondary | ICD-10-CM | POA: Diagnosis not present

## 2015-09-18 DIAGNOSIS — I10 Essential (primary) hypertension: Secondary | ICD-10-CM | POA: Diagnosis present

## 2015-09-18 DIAGNOSIS — Z808 Family history of malignant neoplasm of other organs or systems: Secondary | ICD-10-CM

## 2015-09-18 DIAGNOSIS — Z888 Allergy status to other drugs, medicaments and biological substances status: Secondary | ICD-10-CM | POA: Diagnosis not present

## 2015-09-18 DIAGNOSIS — F329 Major depressive disorder, single episode, unspecified: Secondary | ICD-10-CM | POA: Diagnosis present

## 2015-09-18 DIAGNOSIS — C7951 Secondary malignant neoplasm of bone: Secondary | ICD-10-CM | POA: Diagnosis present

## 2015-09-18 DIAGNOSIS — E7800 Pure hypercholesterolemia, unspecified: Secondary | ICD-10-CM | POA: Diagnosis present

## 2015-09-18 DIAGNOSIS — R590 Localized enlarged lymph nodes: Secondary | ICD-10-CM | POA: Diagnosis present

## 2015-09-18 DIAGNOSIS — G620 Drug-induced polyneuropathy: Secondary | ICD-10-CM | POA: Diagnosis present

## 2015-09-18 DIAGNOSIS — C50919 Malignant neoplasm of unspecified site of unspecified female breast: Secondary | ICD-10-CM | POA: Diagnosis present

## 2015-09-18 DIAGNOSIS — Z791 Long term (current) use of non-steroidal anti-inflammatories (NSAID): Secondary | ICD-10-CM

## 2015-09-18 DIAGNOSIS — G893 Neoplasm related pain (acute) (chronic): Principal | ICD-10-CM | POA: Diagnosis present

## 2015-09-18 DIAGNOSIS — Z85850 Personal history of malignant neoplasm of thyroid: Secondary | ICD-10-CM | POA: Diagnosis not present

## 2015-09-18 DIAGNOSIS — Z79891 Long term (current) use of opiate analgesic: Secondary | ICD-10-CM

## 2015-09-18 DIAGNOSIS — Z7984 Long term (current) use of oral hypoglycemic drugs: Secondary | ICD-10-CM

## 2015-09-18 DIAGNOSIS — Z9012 Acquired absence of left breast and nipple: Secondary | ICD-10-CM | POA: Diagnosis not present

## 2015-09-18 DIAGNOSIS — D649 Anemia, unspecified: Secondary | ICD-10-CM | POA: Diagnosis present

## 2015-09-18 DIAGNOSIS — E119 Type 2 diabetes mellitus without complications: Secondary | ICD-10-CM | POA: Diagnosis present

## 2015-09-18 DIAGNOSIS — Z7189 Other specified counseling: Secondary | ICD-10-CM | POA: Diagnosis not present

## 2015-09-18 LAB — DIFFERENTIAL
Basophils Absolute: 0 10*3/uL (ref 0.0–0.1)
Basophils Relative: 0 %
EOS ABS: 0.1 10*3/uL (ref 0.0–0.7)
EOS PCT: 4 %
Lymphocytes Relative: 10 %
Lymphs Abs: 0.4 10*3/uL — ABNORMAL LOW (ref 0.7–4.0)
MONO ABS: 0.4 10*3/uL (ref 0.1–1.0)
Monocytes Relative: 12 %
NEUTROS PCT: 74 %
Neutro Abs: 2.5 10*3/uL (ref 1.7–7.7)

## 2015-09-18 LAB — CBC
HCT: 28.9 % — ABNORMAL LOW (ref 36.0–46.0)
Hemoglobin: 9.6 g/dL — ABNORMAL LOW (ref 12.0–15.0)
MCH: 28.7 pg (ref 26.0–34.0)
MCHC: 33.2 g/dL (ref 30.0–36.0)
MCV: 86.3 fL (ref 78.0–100.0)
PLATELETS: 243 10*3/uL (ref 150–400)
RBC: 3.35 MIL/uL — ABNORMAL LOW (ref 3.87–5.11)
RDW: 16.4 % — ABNORMAL HIGH (ref 11.5–15.5)
WBC: 3.3 10*3/uL — ABNORMAL LOW (ref 4.0–10.5)

## 2015-09-18 LAB — BASIC METABOLIC PANEL
Anion gap: 12 (ref 5–15)
BUN: 17 mg/dL (ref 6–20)
CHLORIDE: 103 mmol/L (ref 101–111)
CO2: 23 mmol/L (ref 22–32)
CREATININE: 1.23 mg/dL — AB (ref 0.44–1.00)
Calcium: 8.7 mg/dL — ABNORMAL LOW (ref 8.9–10.3)
GFR calc Af Amer: >60 mL/min (ref >60)
GFR, EST NON AFRICAN AMERICAN: 52 mL/min — AB (ref >60)
GLUCOSE: 100 mg/dL — AB (ref 65–99)
Potassium: 3.8 mmol/L (ref 3.5–5.1)
SODIUM: 138 mmol/L (ref 135–145)

## 2015-09-18 LAB — GLUCOSE, CAPILLARY: GLUCOSE-CAPILLARY: 134 mg/dL — AB (ref 65–99)

## 2015-09-18 MED ORDER — ADULT MULTIVITAMIN W/MINERALS CH
1.0000 | ORAL_TABLET | Freq: Every day | ORAL | Status: DC
  Administered 2015-09-18 – 2015-09-21 (×4): 1 via ORAL
  Filled 2015-09-18 (×4): qty 1

## 2015-09-18 MED ORDER — LETROZOLE 2.5 MG PO TABS
2.5000 mg | ORAL_TABLET | Freq: Every day | ORAL | Status: DC
  Administered 2015-09-18 – 2015-09-21 (×4): 2.5 mg via ORAL
  Filled 2015-09-18 (×6): qty 1

## 2015-09-18 MED ORDER — SODIUM CHLORIDE 0.9 % IV BOLUS (SEPSIS)
1000.0000 mL | Freq: Once | INTRAVENOUS | Status: AC
  Administered 2015-09-18: 1000 mL via INTRAVENOUS

## 2015-09-18 MED ORDER — HYDROMORPHONE HCL 1 MG/ML IJ SOLN
1.0000 mg | Freq: Once | INTRAMUSCULAR | Status: AC
  Administered 2015-09-18: 1 mg via INTRAVENOUS
  Filled 2015-09-18: qty 1

## 2015-09-18 MED ORDER — DIPHENHYDRAMINE HCL 50 MG/ML IJ SOLN
12.5000 mg | Freq: Four times a day (QID) | INTRAMUSCULAR | Status: DC | PRN
Start: 2015-09-18 — End: 2015-09-21

## 2015-09-18 MED ORDER — ENOXAPARIN SODIUM 40 MG/0.4ML ~~LOC~~ SOLN
40.0000 mg | SUBCUTANEOUS | Status: DC
  Administered 2015-09-18 – 2015-09-20 (×3): 40 mg via SUBCUTANEOUS
  Filled 2015-09-18 (×3): qty 0.4

## 2015-09-18 MED ORDER — ONDANSETRON HCL 4 MG/2ML IJ SOLN: 4.0000 mg | Freq: Four times a day (QID) | INTRAMUSCULAR | Status: DC | PRN

## 2015-09-18 MED ORDER — INSULIN ASPART 100 UNIT/ML ~~LOC~~ SOLN: 0.0000 [IU] | Freq: Every day | SUBCUTANEOUS | Status: DC

## 2015-09-18 MED ORDER — SODIUM CHLORIDE 0.9% FLUSH: 9.0000 mL | INTRAVENOUS | Status: DC | PRN

## 2015-09-18 MED ORDER — METFORMIN HCL 500 MG PO TABS
1000.0000 mg | ORAL_TABLET | Freq: Two times a day (BID) | ORAL | Status: DC
  Administered 2015-09-19 – 2015-09-21 (×5): 1000 mg via ORAL
  Filled 2015-09-18 (×6): qty 2

## 2015-09-18 MED ORDER — ONDANSETRON HCL 4 MG/2ML IJ SOLN
4.0000 mg | Freq: Once | INTRAMUSCULAR | Status: AC
  Administered 2015-09-18: 4 mg via INTRAVENOUS
  Filled 2015-09-18: qty 2

## 2015-09-18 MED ORDER — ONDANSETRON HCL 4 MG PO TABS: 4.0000 mg | ORAL_TABLET | Freq: Four times a day (QID) | ORAL | Status: DC | PRN

## 2015-09-18 MED ORDER — PALBOCICLIB 100 MG PO CAPS
100.0000 mg | ORAL_CAPSULE | Freq: Every day | ORAL | Status: DC
Start: 2015-09-18 — End: 2015-09-21
  Administered 2015-09-18 – 2015-09-21 (×4): 100 mg via ORAL

## 2015-09-18 MED ORDER — METHADONE HCL 5 MG PO TABS
5.0000 mg | ORAL_TABLET | Freq: Three times a day (TID) | ORAL | Status: DC
  Administered 2015-09-18 – 2015-09-20 (×5): 5 mg via ORAL
  Filled 2015-09-18 (×5): qty 1

## 2015-09-18 MED ORDER — INSULIN ASPART 100 UNIT/ML ~~LOC~~ SOLN: 0.0000 [IU] | Freq: Three times a day (TID) | SUBCUTANEOUS | Status: DC

## 2015-09-18 MED ORDER — FLUOXETINE HCL 20 MG PO TABS
20.0000 mg | ORAL_TABLET | Freq: Every day | ORAL | Status: DC
  Administered 2015-09-18 – 2015-09-19 (×2): 20 mg via ORAL
  Filled 2015-09-18 (×5): qty 1

## 2015-09-18 MED ORDER — ALBUTEROL SULFATE (2.5 MG/3ML) 0.083% IN NEBU: 2.5000 mg | INHALATION_SOLUTION | Freq: Four times a day (QID) | RESPIRATORY_TRACT | Status: DC | PRN

## 2015-09-18 MED ORDER — NALOXONE HCL 0.4 MG/ML IJ SOLN: 0.4000 mg | INTRAMUSCULAR | Status: DC | PRN

## 2015-09-18 MED ORDER — SODIUM CHLORIDE 0.9 % IV SOLN
INTRAVENOUS | Status: DC
Start: 2015-09-18 — End: 2015-09-20
  Administered 2015-09-18 – 2015-09-20 (×2): via INTRAVENOUS

## 2015-09-18 MED ORDER — HYDROMORPHONE 1 MG/ML IV SOLN
INTRAVENOUS | Status: DC
  Administered 2015-09-18: 20:00:00 via INTRAVENOUS
  Administered 2015-09-19: 1.8 mg via INTRAVENOUS
  Administered 2015-09-19: 2.1 mg via INTRAVENOUS
  Administered 2015-09-19: 2 mg via INTRAVENOUS
  Administered 2015-09-19: 2.7 mg via INTRAVENOUS
  Administered 2015-09-19: 0.9 mg via INTRAVENOUS
  Administered 2015-09-19: 1.5 mg via INTRAVENOUS
  Administered 2015-09-20: 3 mg via INTRAVENOUS
  Administered 2015-09-20: 0.3 mg via INTRAVENOUS
  Administered 2015-09-20: 3.9 mg via INTRAVENOUS
  Administered 2015-09-20 – 2015-09-21 (×3): 0.3 mg via INTRAVENOUS
  Administered 2015-09-21: 1.2 mg via INTRAVENOUS
  Administered 2015-09-21: 0.6 mg via INTRAVENOUS
  Filled 2015-09-18: qty 25

## 2015-09-18 MED ORDER — GABAPENTIN 300 MG PO CAPS
600.0000 mg | ORAL_CAPSULE | Freq: Once | ORAL | Status: AC
  Administered 2015-09-18: 600 mg via ORAL
  Filled 2015-09-18: qty 2

## 2015-09-18 MED ORDER — DIPHENHYDRAMINE HCL 12.5 MG/5ML PO ELIX
12.5000 mg | ORAL_SOLUTION | Freq: Four times a day (QID) | ORAL | Status: DC | PRN
Start: 2015-09-18 — End: 2015-09-21

## 2015-09-18 NOTE — ED Notes (Signed)
Pt unable to sit on side of bed or ambulate. MD aware.

## 2015-09-18 NOTE — ED Notes (Addendum)
Report given to Butch Penny, [place]. Pt ready for transport to 1333.

## 2015-09-18 NOTE — ED Notes (Signed)
Pt presents with c/o bilateral hip pain. Pt is having a hard time sitting in the wheelchair in triage d/t pain. Pt is a cancer patient, bone cancer. Pt is normally ambulatory at home.

## 2015-09-18 NOTE — ED Provider Notes (Signed)
CSN: [id number]     Arrival date & time 09/18/15  1258 History   First MD Initiated Contact with Patient 09/18/15 1316     Chief Complaint  Patient presents with  . Hip Pain     (Consider location/radiation/quality/duration/timing/severity/associated sxs/prior Treatment) HPI  Pt with hx of metastatic breast cancer presenting with increased pain from bony mets.  She is currently undergoing palliative radiation to hips and has had worsening bilateral hip and left shoulder pain.  She is taking methadone- which was increased to 10mg  BID 3 weeks ago, oxycontin, and morphine.  She states she had been walking with a cane, but over the past several days her pain has become worse to the point that she cannot walk.  She was living at her home, but had a flight of stairs to walk up and couldn't do that anymore, so she has been staying at her aunts home, however now is not able to walk or sit in a chair due to pain.  No fever/chills.  No leg weakness. No back pain.  No new falls or injuries- per chart review at most recent radidation session the xrays of her hips were negative.  Pain is worse with movement and palpation.  There are no other associated systemic symptoms, there are no other alleviating or modifying factors.   Past Medical History  Diagnosis Date  . Hypertension   . Sarcoidosis ([place])     no problems per patient   . Anemia   . Complication of anesthesia     makes pt. restless and unable to be still  . High cholesterol   . Thyroid cancer ([place]) 2015  . Type II diabetes mellitus ([place])   . Breast cancer ([place]) 2015    left upper outer;  had chemo  . Radiation 06/23/14-08/11/14    Invasive ductal ca o left breast  . SVD (spontaneous vaginal delivery)     x 2  . Heart murmur     never had any problems as adult  . Bronchitis 2014  . Depression     no meds currently  . Anxiety     no meds currently  . GERD (gastroesophageal reflux disease)    Past Surgical History  Procedure Laterality  Date  . Tubal ligation  2001  . Endobronchial ultrasound Bilateral 12/02/2012    Procedure: ENDOBRONCHIAL ULTRASOUND;  Surgeon: Collene Gobble, MD;  [facility];  Service: Cardiopulmonary;  Laterality: Bilateral;  . Portacath placement N/A 10/22/2013    Procedure: INSERTION PORT-A-CATH;  Surgeon: [name], MD;  Location: [place];  Service: General;  Laterality: N/A;  . Lung biopsy  2014  . Mastectomy w/ sentinel node biopsy Left 04/23/2014    & axillary LND  . Mastectomy w/ sentinel node biopsy Left 04/23/2014    Procedure: LEFT BREAST MASTECTOMY WITH SENTINEL LYMPH NODE BIOPSY AND AXILLARY LYMPH NODE DISECTION;  Surgeon: [name], MD;  Location: [place];  Service: General;  Laterality: Left;  . Thyroid lobectomy Left 04/23/2014    Procedure: LEFT THYROID LOBECTOMY;  Surgeon: [name], MD;  Location: [place];  Service: General;  Laterality: Left;  . Breast biopsy Left 2015 X 3    biopsy  . Wisdom tooth extraction    . Leep    . Laparoscopic assisted vaginal hysterectomy N/A 12/09/2014    Procedure: LAPAROSCOPIC ASSISTED VAGINAL HYSTERECTOMY;  Surgeon: Cheri Fowler, MD;  Location: [facility];  Service: Gynecology;  Laterality: N/A;  . Salpingoophorectomy Bilateral 12/09/2014  Procedure: SALPINGO OOPHORECTOMY;  Surgeon: Cheri Fowler, MD;  Location: [facility];  Service: Gynecology;  Laterality: Bilateral;  . Abdominal hysterectomy    . Port-a-cath removal Left 02/03/2015    Procedure: REMOVAL PORT-A-CATH;  Surgeon: [name], MD;  Location: [place];  Service: General;  Laterality: Left;   Family History  Problem Relation Age of Onset  . Cancer Paternal Aunt     "bone cancer"; deceased 60s  . Cancer Paternal Uncle     stomach cancer; deceased 11s  . Cancer Paternal Aunt     unk. primary; currently 84s  . Prostate cancer Maternal Grandfather 63  . Asthma Mother    Social History  Substance Use Topics  . Smoking status: Current Some Day Smoker -- 0.25  packs/day for 25 years    Types: Cigarettes  . Smokeless tobacco: Never Used     Comment: 04/23/2014 "using vapor; down to 3-4 cigarettes/day now"  . Alcohol Use: 6.0 oz/week    4 Cans of beer, 4 Shots of liquor, 2 Standard drinks or equivalent per week   OB History    No data available     Review of Systems  ROS reviewed and all otherwise negative except for mentioned in HPI    Allergies  Nyquil multi-symptom  Home Medications   Prior to Admission medications   Medication Sig Start Date End Date Taking? Authorizing Provider  albuterol (PROVENTIL HFA;VENTOLIN HFA) 108 (90 BASE) MCG/ACT inhaler Inhale 2 puffs into the lungs every 6 (six) hours as needed for wheezing. Reported on 09/01/2015   Yes Historical Provider, MD  chlorthalidone (HYGROTON) 25 MG tablet Take 12.5 mg by mouth daily.   Yes Historical Provider, MD  ferrous sulfate 325 (65 FE) MG tablet Take 325 mg by mouth daily with breakfast.    Yes Historical Provider, MD  FLUoxetine (PROZAC) 20 MG tablet Take 20 mg by mouth every morning.   Yes Historical Provider, MD  gabapentin (NEURONTIN) 300 MG capsule Take 2 capsules (600 mg total) by mouth 2 (two) times daily. Patient taking differently: Take 300-600 mg by mouth 3 (three) times daily. Take 300mg  in the morning and afternoon and 600mg  in the evening 02/18/15  Yes Laurie Panda, NP  letrozole [facility]) 2.5 MG tablet Take 1 tablet (2.5 mg total) by mouth daily. 08/06/15  Yes Chauncey Cruel, MD  metFORMIN (GLUCOPHAGE) 1000 MG tablet Take 1,000 mg by mouth 2 (two) times daily with a meal.  04/13/14  Yes Historical Provider, MD  methadone (DOLOPHINE) 5 MG tablet Take 1 tablet (5 mg total) by mouth every 8 (eight) hours. 08/27/15  Yes Chauncey Cruel, MD  morphine (MSIR) 15 MG tablet Take 15 mg by mouth 2 (two) times daily.   Yes Historical Provider, MD  Multiple Vitamin (MULTIVITAMIN WITH MINERALS) TABS tablet Take 1 tablet by mouth daily.   Yes Historical Provider, MD   naproxen (NAPROSYN) 500 MG tablet Take 1 tablet (500 mg total) by mouth 3 (three) times daily with meals. 07/23/15  Yes Chauncey Cruel, MD  oxyCODONE-acetaminophen (PERCOCET/ROXICET) 5-325 MG tablet Take 1 tablet by mouth every 6 (six) hours as needed for severe pain. 08/24/15  Yes Thea Silversmith, MD  OXYCONTIN 10 MG 12 hr tablet Take 10 mg by mouth every 8 (eight) hours. 09/08/15  Yes Historical Provider, MD  palbociclib (IBRANCE) 100 MG capsule Take 1 capsule (100 mg total) by mouth daily with breakfast. Take whole with food. 09/01/15  Yes Laurie Panda, NP  BP 118/70 mmHg  Pulse 67  Temp(Src) 98 F (36.7 C) (Oral)  Resp 16  SpO2 94%  LMP 12/07/2014  Vitals reviewed Physical Exam  Physical Examination: General appearance - alert, uncomfortable appearing, and in no distress Mental status - alert, oriented to person, place, and time Eyes - no conjunctival injection no scleral icterus Mouth - mucous membranes moist, pharynx normal without lesions Chest - clear to auscultation, no wheezes, rales or rhonchi, symmetric air entry Heart - normal rate, regular rhythm, normal S1, S2, no murmurs, rubs, clicks or gallops Abdomen - soft, nontender, nondistended, no masses or organomegaly Neurological - alert, oriented, normal speech Musculoskeletal - ttp to bialteral hips and right shoulder, pain with minimal range of motion or movement of hips or shoulder Extremities - peripheral pulses normal, no pedal edema, no clubbing or cyanosis Skin - normal coloration and turgor, no rashes  ED Course  Procedures (including critical care time) Labs Review Labs Reviewed  CBC - Abnormal; Notable for the following:    WBC 3.3 (*)    RBC 3.35 (*)    Hemoglobin 9.6 (*)    HCT 28.9 (*)    RDW 16.4 (*)    All other components within normal limits  BASIC METABOLIC PANEL - Abnormal; Notable for the following:    Glucose, Bld 100 (*)    Creatinine, Ser 1.23 (*)    Calcium 8.7 (*)    GFR calc non Af  Amer 52 (*)    All other components within normal limits  BASIC METABOLIC PANEL - Abnormal; Notable for the following:    Glucose, Bld 138 (*)    Creatinine, Ser 1.13 (*)    Calcium 8.5 (*)    GFR calc non Af Amer 57 (*)    All other components within normal limits  CBC - Abnormal; Notable for the following:    WBC 2.7 (*)    RBC 3.17 (*)    Hemoglobin 9.0 (*)    HCT 28.0 (*)    RDW 16.4 (*)    All other components within normal limits  DIFFERENTIAL - Abnormal; Notable for the following:    Lymphs Abs 0.4 (*)    All other components within normal limits  GLUCOSE, CAPILLARY - Abnormal; Notable for the following:    Glucose-Capillary 134 (*)    All other components within normal limits  GLUCOSE, CAPILLARY - Abnormal; Notable for the following:    Glucose-Capillary 138 (*)    All other components within normal limits  GLUCOSE, CAPILLARY    Imaging Review No results found. I have personally reviewed and evaluated these images and lab results as part of my medical decision-making.   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  She has known bony mets from breast cancer and has been taking methadone, oxycontin and morphine at home without relief.  She is not able to ambulate.  She is not able to get up from the bed or attempt to ambulate after 2 rounds of IV dilaudid in the ED.  Pt to be admitted to [facility] for pain management.  She remains full code at this time.    4:41 PM d/w hospitalist for admission to med/surg bed.    Alfonzo Beers, MD 09/19/15 1346

## 2015-09-18 NOTE — H&P (Signed)
[facility] History and Physical  KHAMORA ZASKE [name] DOB: 24-Sep-1970 DOA: 09/18/2015  Referring physician:  PCP: Elyn Peers, MD   Chief Complaint: Uncontrolled pain  HPI: Shelly Hall is a 45 y.o. female with a past medical history of stage IV breast cancer having multiple bony metastatic lesions, biopsy showing adenocarcinoma antigen receptor positive treated with letrozole and palbociclib (started on 08/13/2015) as well as radiation therapy (started on 08/23/2015), currently follows Dr.Magrinat of medical oncology and Dr. Pablo Ledger of radiation oncology. She had her last clinic visit with medical oncology on 09/01/2015. Reading clinic notes it appears that pain control has been an issue in the past. Reviewing her meds she is on methadone, MSIR, OxyContin and Percocet. She reports having good days and bad days with her pain symptoms however it seems that overall her pain has increased over time. She states that in the last 24 hours she has experienced severe 10 out of 10 pain involving bilateral hips, bilateral lower extremities, right shoulder. Symptoms becoming worse despite taking narcotic analgesics. She states unable to perform activities of daily living secondary to severe pain. Her pain is particularly worsened with movement of extremities. In the emergency department she was unable to standup despite getting IV Dilaudid. Her goal is to get back to doing her normal activities. She denies nausea, vomiting, shortness of breath, fevers, chills, dysuria, hematuria.                                          Review of Systems:  Constitutional:  No weight loss, night sweats, Fevers, chills, fatigue.  HEENT:  No headaches, Difficulty swallowing,Tooth/dental problems,Sore throat,  No sneezing, itching, ear ache, nasal congestion, post nasal drip,  Cardio-vascular:  No chest pain, Orthopnea, PND, swelling in lower extremities, anasarca, dizziness, palpitations  GI:  No  heartburn, indigestion, abdominal pain, nausea, vomiting, diarrhea, change in bowel habits, loss of appetite  Resp:  No shortness of breath with exertion or at rest. No excess mucus, no productive cough, No non-productive cough, No coughing up of blood.No change in color of mucus.No wheezing.No chest wall deformity  Skin:  no rash or lesions.  GU:  no dysuria, change in color of urine, no urgency or frequency. No flank pain.  Musculoskeletal:  Positive for severe bilateral hip pain, right shoulder pain, bone pain Psych:  No change in mood or affect. No depression or anxiety. No memory loss.   Past Medical History  Diagnosis Date  . Hypertension   . Sarcoidosis ([place])     no problems per patient   . Anemia   . Complication of anesthesia     makes pt. restless and unable to be still  . High cholesterol   . Thyroid cancer ([place]) 2015  . Type II diabetes mellitus ([place])   . Breast cancer ([place]) 2015    left upper outer;  had chemo  . Radiation 06/23/14-08/11/14    Invasive ductal ca o left breast  . SVD (spontaneous vaginal delivery)     x 2  . Heart murmur     never had any problems as adult  . Bronchitis 2014  . Depression     no meds currently  . Anxiety     no meds currently  . GERD (gastroesophageal reflux disease)    Past Surgical History  Procedure Laterality Date  . Tubal ligation  2001  .  Endobronchial ultrasound Bilateral 12/02/2012    Procedure: ENDOBRONCHIAL ULTRASOUND;  Surgeon: Collene Gobble, MD;  [facility];  Service: Cardiopulmonary;  Laterality: Bilateral;  . Portacath placement N/A 10/22/2013    Procedure: INSERTION PORT-A-CATH;  Surgeon: [name], MD;  Location: [place];  Service: General;  Laterality: N/A;  . Lung biopsy  2014  . Mastectomy w/ sentinel node biopsy Left 04/23/2014    & axillary LND  . Mastectomy w/ sentinel node biopsy Left 04/23/2014    Procedure: LEFT BREAST MASTECTOMY WITH SENTINEL LYMPH NODE BIOPSY AND AXILLARY LYMPH NODE  DISECTION;  Surgeon: [name], MD;  Location: [place];  Service: General;  Laterality: Left;  . Thyroid lobectomy Left 04/23/2014    Procedure: LEFT THYROID LOBECTOMY;  Surgeon: [name], MD;  Location: [place];  Service: General;  Laterality: Left;  . Breast biopsy Left 2015 X 3    biopsy  . Wisdom tooth extraction    . Leep    . Laparoscopic assisted vaginal hysterectomy N/A 12/09/2014    Procedure: LAPAROSCOPIC ASSISTED VAGINAL HYSTERECTOMY;  Surgeon: Cheri Fowler, MD;  Location: [facility];  Service: Gynecology;  Laterality: N/A;  . Salpingoophorectomy Bilateral 12/09/2014    Procedure: SALPINGO OOPHORECTOMY;  Surgeon: Cheri Fowler, MD;  Location: [facility];  Service: Gynecology;  Laterality: Bilateral;  . Abdominal hysterectomy    . Port-a-cath removal Left 02/03/2015    Procedure: REMOVAL PORT-A-CATH;  Surgeon: [name], MD;  Location: [place];  Service: General;  Laterality: Left;   Social History:  reports that she has been smoking Cigarettes.  She has a 6.25 pack-year smoking history. She has never used smokeless tobacco. She reports that she drinks about 6.0 oz of alcohol per week. She reports that she does not use illicit drugs.  Allergies  Allergen Reactions  . Nyquil Multi-Symptom [Pseudoeph-Doxylamine-Dm-Apap] Itching and Other (See Comments)    Skin peels on hands, and feet    Family History  Problem Relation Age of Onset  . Cancer Paternal Aunt     "bone cancer"; deceased 49s  . Cancer Paternal Uncle     stomach cancer; deceased 55s  . Cancer Paternal Aunt     unk. primary; currently 73s  . Prostate cancer Maternal Grandfather 58  . Asthma Mother      Prior to Admission medications   Medication Sig Start Date End Date Taking? Authorizing Provider  albuterol (PROVENTIL HFA;VENTOLIN HFA) 108 (90 BASE) MCG/ACT inhaler Inhale 2 puffs into the lungs every 6 (six) hours as needed for wheezing. Reported on 09/01/2015   Yes Historical Provider, MD    chlorthalidone (HYGROTON) 25 MG tablet Take 12.5 mg by mouth daily.   Yes Historical Provider, MD  ferrous sulfate 325 (65 FE) MG tablet Take 325 mg by mouth daily with breakfast.    Yes Historical Provider, MD  FLUoxetine (PROZAC) 20 MG tablet Take 20 mg by mouth every morning.   Yes Historical Provider, MD  gabapentin (NEURONTIN) 300 MG capsule Take 2 capsules (600 mg total) by mouth 2 (two) times daily. Patient taking differently: Take 300-600 mg by mouth 3 (three) times daily. Take 300mg  in the morning and afternoon and 600mg  in the evening 02/18/15  Yes Laurie Panda, NP  letrozole [facility]) 2.5 MG tablet Take 1 tablet (2.5 mg total) by mouth daily. 08/06/15  Yes Chauncey Cruel, MD  metFORMIN (GLUCOPHAGE) 1000 MG tablet Take 1,000 mg by mouth 2 (two) times daily with a meal.  04/13/14  Yes  Historical Provider, MD  methadone (DOLOPHINE) 5 MG tablet Take 1 tablet (5 mg total) by mouth every 8 (eight) hours. 08/27/15  Yes Chauncey Cruel, MD  morphine (MSIR) 15 MG tablet Take 15 mg by mouth 2 (two) times daily.   Yes Historical Provider, MD  Multiple Vitamin (MULTIVITAMIN WITH MINERALS) TABS tablet Take 1 tablet by mouth daily.   Yes Historical Provider, MD  naproxen (NAPROSYN) 500 MG tablet Take 1 tablet (500 mg total) by mouth 3 (three) times daily with meals. 07/23/15  Yes Chauncey Cruel, MD  oxyCODONE-acetaminophen (PERCOCET/ROXICET) 5-325 MG tablet Take 1 tablet by mouth every 6 (six) hours as needed for severe pain. 08/24/15  Yes Thea Silversmith, MD  OXYCONTIN 10 MG 12 hr tablet Take 10 mg by mouth every 8 (eight) hours. 09/08/15  Yes Historical Provider, MD  palbociclib (IBRANCE) 100 MG capsule Take 1 capsule (100 mg total) by mouth daily with breakfast. Take whole with food. 09/01/15  Yes Laurie Panda, NP   Physical Exam: Filed Vitals:   09/18/15 1447 09/18/15 1500 09/18/15 1517 09/18/15 1530  BP: 124/74 116/63 116/63 116/69  Pulse: 90  71 74  Temp:      TempSrc:      Resp: 16   16   SpO2: 96%  97% 92%    Wt Readings from Last 3 Encounters:  09/01/15 82.056 kg (180 lb 14.4 oz)  08/31/15 83.054 kg (183 lb 1.6 oz)  08/27/15 85.73 kg (189 lb)    General:  Appears calm and comfortable, she appears uncomfortable however in no acute distress Eyes: PERRL, normal lids, irises & conjunctiva ENT: grossly normal hearing, lips & tongue Neck: no LAD, masses or thyromegaly Cardiovascular: RRR, no m/r/g. No LE edema. Telemetry: SR, no arrhythmias  Respiratory: CTA bilaterally, no w/r/r. Normal respiratory effort. Abdomen: soft, ntnd Skin: no rash or induration seen on limited exam Musculoskeletal: There is severe pain with passive and active movement of bilateral lower extremities Psychiatric: grossly normal mood and affect, speech fluent and appropriate Neurologic: grossly non-focal.          Labs on Admission:  Basic Metabolic Panel:  Recent Labs Lab 09/15/15 1509 09/18/15 1347  NA 137 138  K 4.3 3.8  CL  --  103  CO2 28 23  GLUCOSE 91 100*  BUN 21.1 17  CREATININE 1.2* 1.23*  CALCIUM 9.4 8.7*   Liver Function Tests:  Recent Labs Lab 09/15/15 1509  AST 29  ALT 18  ALKPHOS 141  BILITOT <0.30  PROT 7.6  ALBUMIN 3.6   No results for input(s): LIPASE, AMYLASE in the last 168 hours. No results for input(s): AMMONIA in the last 168 hours. CBC:  Recent Labs Lab 09/15/15 1509 09/18/15 1347  WBC 3.4* 3.3*  NEUTROABS 2.4  --   HGB 10.0* 9.6*  HCT 30.4* 28.9*  MCV 89.7 86.3  PLT 185 243   Cardiac Enzymes: No results for input(s): CKTOTAL, CKMB, CKMBINDEX, TROPONINI in the last 168 hours.  BNP (last 3 results) No results for input(s): BNP in the last 8760 hours.  ProBNP (last 3 results) No results for input(s): PROBNP in the last 8760 hours.  CBG: No results for input(s): GLUCAP in the last 168 hours.  Radiological Exams on Admission: No results found.  EKG: Independently reviewed.   Assessment/Plan Active Problems:    Mediastinal lymphadenopathy   Breast cancer of upper-outer quadrant of left female breast ([place])   Peripheral neuropathy due to chemotherapy ([place])  Pain due to malignant neoplasm metastatic to bone [facility])   Metastatic cancer ([facility])   Cancer associated pain   1.  Pain related to metastatic breast cancer involving bone. Mrs Duchow is a 45 year old female with a past medical history of breast cancer, previous imaging revealing widespread osseous metastasis. MRI performed in February 2017 showed distractive distal clavicular metastatic lesion. She feels that symptoms overall have progressively worsened in packing her ability to function. She is on methadone, OxyContin, Percocet, MSIR. Case was discussed with Dr. Domingo Cocking a palliative care who will assist with managing pain symptoms. For now he recommended continuing methadone at her home dose of 5 mg by mouth 3 times a day and placing her on a Dilaudid PCA.   2.  History of stage IV breast cancer. Previous imaging studies showing multiple bony metastatic lesions as she is undergoing treatment with letrozoleand palbociclib along with palliative radiation therapy at the cancer center. She had her last appointment at the cancer center on 09/01/2015.   3.  Diabetes mellitus. Will continue metformin 1000 mg by mouth twice a day. Provide sliding scale coverage with Accu-Cheks Ac and HS.     Code Status: Full Code DVT Prophylaxis: Lovenox Family Communication: Family not present Disposition Plan: Will admit to med/surg anticipate she will require greater than 2 night hospitalization  Time spent: 65 min   Kelvin Cellar [facility] Pager [phone]

## 2015-09-18 NOTE — ED Notes (Signed)
Bed: [id number] Expected date:  Expected time:  Means of arrival:  Comments: Hold for triage 2

## 2015-09-19 DIAGNOSIS — C801 Malignant (primary) neoplasm, unspecified: Secondary | ICD-10-CM

## 2015-09-19 LAB — BASIC METABOLIC PANEL
Anion gap: 5 (ref 5–15)
BUN: 14 mg/dL (ref 6–20)
CALCIUM: 8.5 mg/dL — AB (ref 8.9–10.3)
CO2: 27 mmol/L (ref 22–32)
CREATININE: 1.13 mg/dL — AB (ref 0.44–1.00)
Chloride: 104 mmol/L (ref 101–111)
GFR calc Af Amer: >60 mL/min (ref >60)
GFR calc non Af Amer: 57 mL/min — ABNORMAL LOW (ref >60)
GLUCOSE: 138 mg/dL — AB (ref 65–99)
Potassium: 5 mmol/L (ref 3.5–5.1)
Sodium: 136 mmol/L (ref 135–145)

## 2015-09-19 LAB — GLUCOSE, CAPILLARY
GLUCOSE-CAPILLARY: 112 mg/dL — AB (ref 65–99)
GLUCOSE-CAPILLARY: 113 mg/dL — AB (ref 65–99)
Glucose-Capillary: 89 mg/dL (ref 65–99)
Glucose-Capillary: 138 mg/dL — ABNORMAL HIGH (ref 65–99)

## 2015-09-19 LAB — CBC
HCT: 28 % — ABNORMAL LOW (ref 36.0–46.0)
HEMOGLOBIN: 9 g/dL — AB (ref 12.0–15.0)
MCH: 28.4 pg (ref 26.0–34.0)
MCHC: 32.1 g/dL (ref 30.0–36.0)
MCV: 88.3 fL (ref 78.0–100.0)
Platelets: 225 10*3/uL (ref 150–400)
RBC: 3.17 MIL/uL — ABNORMAL LOW (ref 3.87–5.11)
RDW: 16.4 % — AB (ref 11.5–15.5)
WBC: 2.7 10*3/uL — ABNORMAL LOW (ref 4.0–10.5)

## 2015-09-19 MED ORDER — GABAPENTIN 300 MG PO CAPS
600.0000 mg | ORAL_CAPSULE | Freq: Once | ORAL | Status: AC
  Administered 2015-09-19: 600 mg via ORAL
  Filled 2015-09-19: qty 2

## 2015-09-19 MED ORDER — POLYETHYLENE GLYCOL 3350 17 G PO PACK
17.0000 g | PACK | Freq: Every day | ORAL | Status: DC
  Administered 2015-09-19 – 2015-09-21 (×3): 17 g via ORAL
  Filled 2015-09-19 (×3): qty 1

## 2015-09-19 MED ORDER — DOCUSATE SODIUM 100 MG PO CAPS
100.0000 mg | ORAL_CAPSULE | Freq: Two times a day (BID) | ORAL | Status: DC
Start: 2015-09-19 — End: 2015-09-21
  Administered 2015-09-19 – 2015-09-21 (×5): 100 mg via ORAL
  Filled 2015-09-19 (×5): qty 1

## 2015-09-19 MED ORDER — LACTULOSE 10 GM/15ML PO SOLN
20.0000 g | Freq: Two times a day (BID) | ORAL | Status: DC
  Administered 2015-09-19 – 2015-09-21 (×4): 20 g via ORAL
  Filled 2015-09-19 (×5): qty 30

## 2015-09-19 MED ORDER — BISACODYL 10 MG RE SUPP
10.0000 mg | Freq: Once | RECTAL | Status: AC
  Administered 2015-09-19: 10 mg via RECTAL
  Filled 2015-09-19: qty 1

## 2015-09-19 NOTE — Progress Notes (Signed)
Utilization review completed.  

## 2015-09-19 NOTE — Progress Notes (Signed)
[facility] PROGRESS NOTE  Shelly Hall [name] DOB: 11-Dec-1970 DOA: 09/18/2015 PCP: Elyn Peers, MD  Assessment/Plan: 1. Pain related to metastatic breast cancer involving bone. -Patient having history of metastatic stage IV breast cancer, presented with severe pain involving bilateral hips right shoulder and "bone pain". -She had been on methadone, OxyContin, Percocet and MSIR -Case discussed with Dr. Domingo Cocking a palliative care who suggested keeping methadone while discontinuing other narcotics and starting Dilaudid PCA. -On my assessment this morning she states feeling better and would like to try to get up out of bed. -Her goal is to recover her functionality and go home after this hospitalization -Await further recommendations from palliative care regarding pain management  2.  History of stage IV breast cancer. -She has multiple bony metastatic lesions seen on previous imaging studies. -He is currently being seen at the cancer center undergoing palliative radiation with letrozole and palbociclib   3.  Type 2 diabetes mellitus -Blood sugars controlled, continue metformin 1000 mg by mouth twice a day  Code Status: Full code Family Communication: Family not present Disposition Plan: Anticipate discharge in the next 24-48 hours   Consultants:  Palliative care   HPI/Subjective: Shelly Hall is a pleasant 45 year old female with past medical history of stage IV breast cancer having multiple bony metastatic lesions, admitted to the medicine service on 09/18/2015 when she presented with complaints of uncontrolled pain. She had been on methadone, MSIR, OxyContin and Percocet in the outpatient setting. She reported having severe pain to bilateral hips bilateral lower extremities and right shoulder. Case discussed with Dr. Domingo Cocking of palliative care who suggested keeping methadone discontinuing other narcotics and placing her on a Dilaudid PCA. By the following morning she  reported improvement to pain symptoms.  Objective: Filed Vitals:   09/19/15 0847 09/19/15 1255  BP:  118/70  Pulse:  67  Temp:  98 F (36.7 C)  Resp: 11 16    Intake/Output Summary (Last 24 hours) at 09/19/15 1430 Last data filed at 09/19/15 1100  Gross per 24 hour  Intake    595 ml  Output    700 ml  Net   -105 ml   There were no vitals filed for this visit.  Exam:   General:  No acute distress, she thinks she is feeling a little better today once to try to get up  Cardiovascular: Regular rate and rhythm normal S1-S2 no murmurs rubs or gallops  Respiratory: Normal respiratory effort lungs are clear  Abdomen: Soft nontender nondistended  Musculoskeletal: She has pain to palpation across her right clavicle and right shoulder, continues to have some pain with passive and active movement of bilateral hips  Data Reviewed: Basic Metabolic Panel:  Recent Labs Lab 09/15/15 1509 09/18/15 1347 09/19/15 0430  NA 137 138 136  K 4.3 3.8 5.0  CL  --  103 104  CO2 28 23 27   GLUCOSE 91 100* 138*  BUN 21.1 17 14   CREATININE 1.2* 1.23* 1.13*  CALCIUM 9.4 8.7* 8.5*   Liver Function Tests:  Recent Labs Lab 09/15/15 1509  AST 29  ALT 18  ALKPHOS 141  BILITOT <0.30  PROT 7.6  ALBUMIN 3.6   No results for input(s): LIPASE, AMYLASE in the last 168 hours. No results for input(s): AMMONIA in the last 168 hours. CBC:  Recent Labs Lab 09/15/15 1509 09/18/15 1347 09/19/15 0430  WBC 3.4* 3.3* 2.7*  NEUTROABS 2.4 2.5  --   HGB 10.0* 9.6* 9.0*  HCT 30.4* 28.9*  28.0*  MCV 89.7 86.3 88.3  PLT 185 243 225   Cardiac Enzymes: No results for input(s): CKTOTAL, CKMB, CKMBINDEX, TROPONINI in the last 168 hours. BNP (last 3 results) No results for input(s): BNP in the last 8760 hours.  ProBNP (last 3 results) No results for input(s): PROBNP in the last 8760 hours.  CBG:  Recent Labs Lab 09/18/15 2202 09/19/15 0732 09/19/15 1135  GLUCAP 134* 89 138*    No  results found for this or any previous visit (from the past 240 hour(s)).   Studies: No results found.  Scheduled Meds: . docusate sodium  100 mg Oral BID  . enoxaparin (LOVENOX) injection  40 mg Subcutaneous Q24H  . FLUoxetine  20 mg Oral Daily  . HYDROmorphone   Intravenous 6 times per day  . insulin aspart  0-15 Units Subcutaneous TID WC  . insulin aspart  0-5 Units Subcutaneous QHS  . lactulose  20 g Oral BID  . letrozole  2.5 mg Oral Daily  . metFORMIN  1,000 mg Oral BID WC  . methadone  5 mg Oral 3 times per day  . multivitamin with minerals  1 tablet Oral Daily  . palbociclib  100 mg Oral Q breakfast  . polyethylene glycol  17 g Oral Daily   Continuous Infusions: . sodium chloride 100 mL/hr at 09/18/15 1945    Active Problems:   Mediastinal lymphadenopathy   Breast cancer of upper-outer quadrant of left female breast ([place])   Peripheral neuropathy due to chemotherapy ([place])   Pain due to malignant neoplasm metastatic to bone [facility])   Metastatic cancer ([place])   Cancer associated pain    Time spent: 30 minutes    Kelvin Cellar  [facility] Pager [phone]. If 7PM-7AM, please contact night-coverage at [email or web address], password [facility] 09/19/2015, 2:30 PM  LOS: 1 day

## 2015-09-20 ENCOUNTER — Other Ambulatory Visit: Payer: Self-pay

## 2015-09-20 DIAGNOSIS — C50412 Malignant neoplasm of upper-outer quadrant of left female breast: Secondary | ICD-10-CM

## 2015-09-20 DIAGNOSIS — C799 Secondary malignant neoplasm of unspecified site: Secondary | ICD-10-CM

## 2015-09-20 DIAGNOSIS — Z7189 Other specified counseling: Secondary | ICD-10-CM

## 2015-09-20 DIAGNOSIS — Z515 Encounter for palliative care: Secondary | ICD-10-CM

## 2015-09-20 LAB — GLUCOSE, CAPILLARY
GLUCOSE-CAPILLARY: 84 mg/dL (ref 65–99)
GLUCOSE-CAPILLARY: 146 mg/dL — AB (ref 65–99)
GLUCOSE-CAPILLARY: 119 mg/dL — AB (ref 65–99)
Glucose-Capillary: 207 mg/dL — ABNORMAL HIGH (ref 65–99)

## 2015-09-20 MED ORDER — METHADONE HCL 10 MG PO TABS
10.0000 mg | ORAL_TABLET | Freq: Two times a day (BID) | ORAL | Status: DC
  Administered 2015-09-20 – 2015-09-21 (×2): 10 mg via ORAL
  Filled 2015-09-20 (×2): qty 1

## 2015-09-20 MED ORDER — DEXAMETHASONE 4 MG PO TABS
4.0000 mg | ORAL_TABLET | Freq: Three times a day (TID) | ORAL | Status: DC
  Administered 2015-09-20 – 2015-09-21 (×3): 4 mg via ORAL
  Filled 2015-09-20 (×3): qty 1

## 2015-09-20 MED ORDER — MAGNESIUM CITRATE PO SOLN
1.0000 | Freq: Once | ORAL | Status: AC
  Administered 2015-09-20: 1 via ORAL
  Filled 2015-09-20: qty 296

## 2015-09-20 MED ORDER — FLUOXETINE HCL 20 MG PO CAPS
20.0000 mg | ORAL_CAPSULE | Freq: Every day | ORAL | Status: DC
  Administered 2015-09-20 – 2015-09-21 (×2): 20 mg via ORAL
  Filled 2015-09-20 (×2): qty 1

## 2015-09-20 MED ORDER — ENSURE ENLIVE PO LIQD
237.0000 mL | Freq: Two times a day (BID) | ORAL | Status: DC
  Administered 2015-09-20: 237 mL via ORAL

## 2015-09-20 MED ORDER — METHADONE HCL 5 MG PO TABS
5.0000 mg | ORAL_TABLET | Freq: Once | ORAL | Status: AC
  Administered 2015-09-20: 5 mg via ORAL
  Filled 2015-09-20: qty 1

## 2015-09-20 MED ORDER — GABAPENTIN 300 MG PO CAPS
600.0000 mg | ORAL_CAPSULE | Freq: Two times a day (BID) | ORAL | Status: DC
  Administered 2015-09-20 – 2015-09-21 (×2): 600 mg via ORAL
  Filled 2015-09-20 (×2): qty 2

## 2015-09-20 MED ORDER — BISACODYL 10 MG RE SUPP
10.0000 mg | Freq: Once | RECTAL | Status: AC
  Administered 2015-09-20: 10 mg via RECTAL
  Filled 2015-09-20: qty 1

## 2015-09-20 NOTE — Progress Notes (Signed)
Pt transferred with assist of two and a walker to  Chair, did well. Sat up for several hours in the chair.

## 2015-09-20 NOTE — Progress Notes (Addendum)
[facility] PROGRESS NOTE  SACHIKO ALSTROM [id number] DOB: 1970-11-17 DOA: 09/18/2015 PCP: Elyn Peers, MD  Assessment/Plan: 1. Pain related to metastatic breast cancer involving bone. -Patient having history of metastatic stage IV breast cancer, presented with severe pain involving bilateral hips right shoulder and "bone pain". -She had been on methadone, OxyContin, Percocet and MSIR -Case discussed with Dr. Domingo Cocking a palliative care who suggested keeping methadone while discontinuing other narcotics and starting Dilaudid PCA. -Her goal is to recover her functionality and go home after this hospitalization -On 09/20/2015 patient reporting improvement to her pain symptoms, she was assisted out of bed to chair however had some difficulty secondary to pain and deconditioning. I spoke with Dr. Domingo Cocking a palliative care who recommended increasing methadone to 10 mg by mouth twice a day. EKG had shown QTc of 450. Plan to continue Dilaudid PCA with next 24 hours, likely transition to oral Dilaudid 8 mg by mouth every 3 hours as needed for breakthrough pain. Will also start dexamethasone 4 mg by mouth 3 times a day  2.  History of stage IV breast cancer. -She has multiple bony metastatic lesions seen on previous imaging studies. -He is currently being seen at the cancer center undergoing palliative radiation with letrozole and palbociclib   3.  Type 2 diabetes mellitus -Blood sugars controlled, continue metformin 1000 mg by mouth twice a day  4.  Deconditioning. -Consulted physical therapy, I think she might benefit from rehabilitation  Code Status: Full code Family Communication: Family not present Disposition Plan: Anticipate discharge in the next 24-48 hours   Consultants:  Palliative care  Physical therapy   HPI/Subjective: Shelly Hall is a pleasant 45 year old female with past medical history of stage IV breast cancer having multiple bony metastatic lesions, admitted to  the medicine service on 09/18/2015 when she presented with complaints of uncontrolled pain. She had been on methadone, MSIR, OxyContin and Percocet in the outpatient setting. She reported having severe pain to bilateral hips bilateral lower extremities and right shoulder. Case discussed with Dr. Domingo Cocking of palliative care who suggested keeping methadone discontinuing other narcotics and placing her on a Dilaudid PCA. By the following morning she reported improvement to pain symptoms.  Objective: Filed Vitals:   09/20/15 0842 09/20/15 1337  BP:  114/78  Pulse:  65  Temp:  97.4 F (36.3 C)  Resp: 16 16    Intake/Output Summary (Last 24 hours) at 09/20/15 1446 Last data filed at 09/20/15 0555  Gross per 24 hour  Intake      0 ml  Output   1900 ml  Net  -1900 ml   There were no vitals filed for this visit.  Exam:   General:  She was assisted out of bed to chair  Cardiovascular: Regular rate and rhythm normal S1-S2 no murmurs rubs or gallops  Respiratory: Normal respiratory effort lungs are clear  Abdomen: Soft nontender nondistended  Musculoskeletal: Thinks her pain symptoms might be getting better however continues to have significant pain limiting mobilization  Data Reviewed: Basic Metabolic Panel:  Recent Labs Lab 09/15/15 1509 09/18/15 1347 09/19/15 0430  NA 137 138 136  K 4.3 3.8 5.0  CL  --  103 104  CO2 28 23 27   GLUCOSE 91 100* 138*  BUN 21.1 17 14   CREATININE 1.2* 1.23* 1.13*  CALCIUM 9.4 8.7* 8.5*   Liver Function Tests:  Recent Labs Lab 09/15/15 1509  AST 29  ALT 18  ALKPHOS 141  BILITOT <0.30  PROT  7.6  ALBUMIN 3.6   No results for input(s): LIPASE, AMYLASE in the last 168 hours. No results for input(s): AMMONIA in the last 168 hours. CBC:  Recent Labs Lab 09/15/15 1509 09/18/15 1347 09/19/15 0430  WBC 3.4* 3.3* 2.7*  NEUTROABS 2.4 2.5  --   HGB 10.0* 9.6* 9.0*  HCT 30.4* 28.9* 28.0*  MCV 89.7 86.3 88.3  PLT 185 243 225   Cardiac  Enzymes: No results for input(s): CKTOTAL, CKMB, CKMBINDEX, TROPONINI in the last 168 hours. BNP (last 3 results) No results for input(s): BNP in the last 8760 hours.  ProBNP (last 3 results) No results for input(s): PROBNP in the last 8760 hours.  CBG:  Recent Labs Lab 09/19/15 1135 09/19/15 1638 09/19/15 2218 09/20/15 0757 09/20/15 1206  GLUCAP 138* 112* 113* 84 146*    No results found for this or any previous visit (from the past 240 hour(s)).   Studies: No results found.  Scheduled Meds: . dexamethasone  4 mg Oral TID  . docusate sodium  100 mg Oral BID  . enoxaparin (LOVENOX) injection  40 mg Subcutaneous Q24H  . feeding supplement (ENSURE ENLIVE)  237 mL Oral BID BM  . FLUoxetine  20 mg Oral Daily  . HYDROmorphone   Intravenous 6 times per day  . insulin aspart  0-15 Units Subcutaneous TID WC  . insulin aspart  0-5 Units Subcutaneous QHS  . lactulose  20 g Oral BID  . letrozole  2.5 mg Oral Daily  . metFORMIN  1,000 mg Oral BID WC  . methadone  10 mg Oral Q12H  . multivitamin with minerals  1 tablet Oral Daily  . palbociclib  100 mg Oral Q breakfast  . polyethylene glycol  17 g Oral Daily   Continuous Infusions: . sodium chloride 100 mL/hr at 09/20/15 [id number]    Active Problems:   Mediastinal lymphadenopathy   Breast cancer of upper-outer quadrant of left female breast ([place])   Peripheral neuropathy due to chemotherapy ([place])   Pain due to malignant neoplasm metastatic to bone [facility])   Metastatic cancer ([place])   Cancer associated pain    Time spent: 30 minutes    Kelvin Cellar  [facility] Pager [phone]. If 7PM-7AM, please contact night-coverage at [email or web address], password [facility] 09/20/2015, 2:46 PM  LOS: 2 days

## 2015-09-20 NOTE — Evaluation (Signed)
Physical Therapy Evaluation Patient Details Name: Shelly Hall MRN: [id number] DOB: 04-20-1971 Today's Date: 09/20/2015   History of Present Illness  -patient admitted 09/18/15 .Patient having history of metastatic stage IV breast cancer, presented with severe pain involving bilateral hips right shoulder and "bone pain. metastic to bilateral pelvis, R clavicle  Clinical Impression  The patient is pleasant and motivated to mobilize today. The patient will benefit From PT while in acute care to address problems listed in the note below.    Follow Up Recommendations Supervision/Assistance - 24 hour    Equipment Recommendations  Rolling walker with 5" wheels    Recommendations for Other Services       Precautions / Restrictions Precautions Precautions: Fall Precaution Comments: R foot drop      Mobility  Bed Mobility Overal bed mobility: Needs Assistance Bed Mobility: Sit to Supine       Sit to supine: Min assist   General bed mobility comments: assist with the  legs onto the bed  Transfers Overall transfer level: Needs assistance Equipment used: Rolling walker (2 wheeled) Transfers: Sit to/from Stand Sit to Stand: Mod assist         General transfer comment: steady assist to rise from bed, noted inversion of Right ankle  Ambulation/Gait Ambulation/Gait assistance: Mod assist Ambulation Distance (Feet): 12 Feet Assistive device: Rolling walker (2 wheeled) Gait Pattern/deviations: Step-to pattern;Step-through pattern;Steppage     General Gait Details: inversion of the r  foot, steppage.   Stairs            Wheelchair Mobility    Modified Rankin (Stroke Patients Only)       Balance Overall balance assessment: History of Falls;Needs assistance Sitting-balance support: Feet supported;No upper extremity supported Sitting balance-Leahy Scale: Fair     Standing balance support: During functional activity;Bilateral upper extremity  supported Standing balance-Leahy Scale: Poor                               Pertinent Vitals/Pain Pain Assessment: 0-10 Pain Score: 6  Pain Location: both hips pelvis Pain Descriptors / Indicators: Burning;Grimacing;Guarding;Penetrating Pain Intervention(s): Limited activity within patient's tolerance;PCA encouraged;Monitored during session;Premedicated before session    Home Living Family/patient expects to be discharged to:: Private residence Living Arrangements: Other relatives Available Help at Discharge: Family Type of Home: Apartment Home Access: Stairs to enter Entrance Stairs-Rails: None Entrance Stairs-Number of Steps: 2 Home Layout: One level Home Equipment: Cane - single point;Tub bench Additional Comments: gouing to aunt's house    Prior Function Level of Independence: Independent with assistive device(s)               Hand Dominance        Extremity/Trunk Assessment   Upper Extremity Assessment: Defer to OT evaluation;RUE deficits/detail RUE Deficits / Details: decreased shoulder elevation         Lower Extremity Assessment: RLE deficits/detail;LLE deficits/detail RLE Deficits / Details: weakness in eversion, noted foot inverts, foot drop noted       Communication   Communication: No difficulties  Cognition Arousal/Alertness: Awake/alert Behavior During Therapy: WFL for tasks assessed/performed Overall Cognitive Status: Within Functional Limits for tasks assessed                      General Comments      Exercises        Assessment/Plan    PT Assessment Patient needs continued PT services  PT Diagnosis  Generalized weakness;Acute pain;Abnormality of gait   PT Problem List Decreased strength;Decreased activity tolerance;Decreased balance;Decreased mobility;Impaired sensation;Decreased knowledge of precautions;Decreased safety awareness;Pain  PT Treatment Interventions DME instruction;Gait training;Stair  training;Functional mobility training;Therapeutic activities;Patient/family education   PT Goals (Current goals can be found in the Care Plan section) Acute Rehab PT Goals Patient Stated Goal: to walk better, get to the bathroom PT Goal Formulation: With patient Time For Goal Achievement: 10/04/15 Potential to Achieve Goals: Good    Frequency Min 3X/week   Barriers to discharge        Co-evaluation               End of Session   Activity Tolerance: Patient limited by pain Patient left: in bed;with call bell/phone within reach;with bed alarm set Nurse Communication: Mobility status         Time: [id number] PT Time Calculation (min) (ACUTE ONLY): 15 min   Charges:   PT Evaluation $PT Eval Low Complexity: 1 Procedure     PT G CodesClaretha Cooper 09/20/2015, 3:32 PM [name] PT [phone]

## 2015-09-20 NOTE — Progress Notes (Signed)
  Radiation Oncology         [phone] ________________________________  Name: KORTLYN TAKANO MRN: [id number]  Date: 09/06/2015  DOB: 09-21-1970  End of Treatment Note  Diagnosis:   Breast cancer of upper-outer quadrant of left female breast ([place])   Staging form: Breast, AJCC 7th Edition     Clinical: Stage IIIA (T3, N1, cM0) - Unsigned       Staging comments: Staged at breast conference 10/15/13      Pathologic: Stage IV ([place], [place], M1) - Signed by Chauncey Cruel, MD on 08/06/2015  Recurrent cancer of left breast [facility])   Staging form: Breast, AJCC 7th Edition     Clinical: Stage IV (M1) - Signed by Chauncey Cruel, MD on 08/07/2015      Indication for treatment:  Palliative       Radiation treatment dates:   08/23/2015-09/06/2015  Site/dose:   Pelvis, right shoulder and left proximal femur were all treated to 30 GY in 10 fractions at 3 Gy per fraction  Beams/energy:   The pelvic and right shoulder were treated with 3D plans and 15 and 10 MV photons respectively.  The left proximal femur was treated with 15 MV photons in an AP/PA technique.   Narrative: The patient tolerated radiation treatment relatively well.   She had some pain relief towards the end of treatment but not complete.   Plan: The patient has completed radiation treatment. The patient will return to radiation oncology clinic for routine followup in one month. I advised them to call or return sooner if they have any questions or concerns related to their recovery or treatment.  ------------------------------------------------  Thea Silversmith, MD

## 2015-09-20 NOTE — Progress Notes (Signed)
PT Cancellation Note  Patient Details Name: Shelly Hall MRN: [id number] DOB: [birth year]   Cancelled Treatment:    Reason Eval/Treat Not Completed: Patient at procedure or test/unavailable (on [facility], CNA states she willbe awhile after laxatives. Check back at another time. )   [name] 09/20/2015, 2:52 PM [name] PT [phone]

## 2015-09-20 NOTE — Progress Notes (Signed)
Initial Nutrition Assessment  INTERVENTION:   Consider medication for acid reflux if symptoms persist. Recommend obtaining current height/weight, if able.  Provide Ensure Enlive po BID, each supplement provides 350 kcal and 20 grams of protein Encourage PO intake RD to continue to monitor  NUTRITION DIAGNOSIS:   Increased nutrient needs related to cancer and cancer related treatments as evidenced by estimated needs.  GOAL:   Patient will meet greater than or equal to 90% of their needs  MONITOR:   PO intake, Supplement acceptance, Labs, Weight trends, I & O's  REASON FOR ASSESSMENT:   Malnutrition Screening Tool    ASSESSMENT:   45 y.o. female with a past medical history of stage IV breast cancer having multiple bony metastatic lesions, biopsy showing adenocarcinoma antigen receptor positive treated with letrozole and palbociclib (started on 08/13/2015) as well as radiation therapy (started on 08/23/2015)  Patient reports feeling hungry but states that she is experiencing some taste changes. States that food doesn't "taste right", denies metallic tastes or blandness. Pt is eating well, 85-100% meal completion. She states she really likes the balsamic chicken on the menu and it doesn't taste bad to her. She ordered this with a side salad for lunch. For breakfast, she did not eat but a spoonful of cereal, a couple of bites of a pancake and bacon. Denies any swallowing or chewing issues. She does report some issues with acid reflux, states this only occurs after chemo.   No current weight is available to assess weight status. Last recorded weight is 180 lb from 3/29, she had 6% wt loss in 1 week at that time. Nutrition focused physical exam shows no sign of depletion of muscle mass or body fat.  Medications: Multivitamin with minerals daily Labs reviewed: CBGs: 84-146  Diet Order:  Diet regular Room service appropriate?: Yes; Fluid consistency:: Thin  Skin:  Reviewed, no  issues  Last BM:  4/13  Height:   Ht Readings from Last 1 Encounters:  09/01/15 5\' 3"  (1.6 m)    Weight:   Wt Readings from Last 1 Encounters:  09/01/15 180 lb 14.4 oz (82.056 kg)    Ideal Body Weight:  52.3 kg  BMI:  There is no weight on file to calculate BMI.  Estimated Nutritional Needs:   Kcal:  1800-2000  Protein:  80-90g  Fluid:  2L/day  EDUCATION NEEDS:   No education needs identified at this time  Clayton Bibles, MS, RD, LDN Pager: [phone] After Hours Pager: [phone]

## 2015-09-21 ENCOUNTER — Other Ambulatory Visit: Payer: Medicaid Other

## 2015-09-21 ENCOUNTER — Ambulatory Visit: Payer: Medicaid Other | Admitting: Oncology

## 2015-09-21 ENCOUNTER — Encounter: Payer: Self-pay | Admitting: Oncology

## 2015-09-21 DIAGNOSIS — Z515 Encounter for palliative care: Secondary | ICD-10-CM | POA: Insufficient documentation

## 2015-09-21 LAB — GLUCOSE, CAPILLARY
GLUCOSE-CAPILLARY: 132 mg/dL — AB (ref 65–99)
GLUCOSE-CAPILLARY: 206 mg/dL — AB (ref 65–99)

## 2015-09-21 MED ORDER — HYDROMORPHONE HCL 8 MG PO TABS: 8.0000 mg | ORAL_TABLET | ORAL | Status: DC | PRN

## 2015-09-21 MED ORDER — LACTULOSE 10 GM/15ML PO SOLN: 20.0000 g | Freq: Two times a day (BID) | ORAL | Status: DC

## 2015-09-21 MED ORDER — METHADONE HCL 10 MG PO TABS: 10.0000 mg | ORAL_TABLET | Freq: Two times a day (BID) | ORAL | Status: DC

## 2015-09-21 MED ORDER — DEXAMETHASONE 4 MG PO TABS: 4.0000 mg | ORAL_TABLET | Freq: Three times a day (TID) | ORAL | Status: DC

## 2015-09-21 MED FILL — HYDROmorphone HCL 8 MG TABS: 8 | 3 days supply | Qty: 30 | Fill #0

## 2015-09-21 MED FILL — DEXAMETHASONE 4 MG TABLET: 4 | 10 days supply | Qty: 30 | Fill #0

## 2015-09-21 NOTE — Care Management Note (Signed)
Case Management Note  Patient Details  Name: Shelly Hall MRN: [id number] Date of Birth: 05-22-1971  Subjective/Objective:                    Action/Plan:   Expected Discharge Date:                  Expected Discharge Plan:  [place]  In-House Referral:     Discharge planning Services  CM Consult  Post Acute Care Choice:    Choice offered to:  Patient  DME Arranged:  3-N-1, Walker rolling DME Agency:     HH Arranged:  RN, NA [place] Agency:  [place]  Status of Service:  In process, will continue to follow  Medicare Important Message Given:    Date Medicare IM Given:    Medicare IM give by:    Date Additional Medicare IM Given:    Additional Medicare Important Message give by:     If discussed at [place] of Stay Meetings, dates discussed:    Additional Comments:  Lynnell Catalan, RN 09/21/2015, 12:49 PM

## 2015-09-21 NOTE — Discharge Summary (Signed)
Physician Discharge Summary  FION GRAUMAN [id number] DOB: 10/04/1970 DOA: 09/18/2015  PCP: Elyn Peers, MD  Admit date: 09/18/2015 Discharge date: 09/21/2015  Time spent: 35 minutes  Recommendations for Outpatient Follow-up:  1. Please follow up on pain symptoms, she was discharged on Methadone 10 mg PO BID and Dilaudid 8 mg PO q 3 hours PRN 2. She was discharged on dexamethasone 4 mg by mouth 3 times a day, please titrate on hospital follow-up visit 3. Would recommend checking an EKG on hospital follow-up visit as well as her methadone was increased to 10 mg twice a day to monitor QTc.  4. She was discharged home with home health services.   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  History of present illness:  Shelly Hall is a 45 y.o. female with a past medical history of stage IV breast cancer having multiple bony metastatic lesions, biopsy showing adenocarcinoma antigen receptor positive treated with letrozole and palbociclib (started on 08/13/2015) as well as radiation therapy (started on 08/23/2015), currently follows Dr.Magrinat of medical oncology and Dr. Pablo Ledger of radiation oncology. She had her last clinic visit with medical oncology on 09/01/2015. Reading clinic notes it appears that pain control has been an issue in the past. Reviewing her meds she is on methadone, MSIR, OxyContin and Percocet. She reports having good days and bad days with her pain symptoms however it seems that overall her pain has increased over time. She states that in the last 24 hours she has experienced severe 10 out of 10  pain involving bilateral hips, bilateral lower extremities, right shoulder. Symptoms becoming worse despite taking narcotic analgesics. She states unable to perform activities of daily living secondary to severe pain. Her pain is particularly worsened with movement of extremities. In the emergency department she was unable to standup despite getting IV Dilaudid. Her goal is to get back to doing her normal activities. She denies nausea, vomiting, shortness of breath, fevers, chills, dysuria, hematuria.  Hospital Course:  Shelly Hall is a pleasant 45 year old female with past medical history of stage IV breast cancer having multiple bony metastatic lesions, admitted to the medicine service on 09/18/2015 when she presented with complaints of uncontrolled pain. She had been on methadone, MSIR, OxyContin and Percocet in the outpatient setting. She reported having severe pain to bilateral hips bilateral lower extremities and right shoulder. Case discussed with Dr. Domingo Cocking of palliative care who suggested keeping methadone discontinuing other narcotics and placing her on a Dilaudid PCA. By the following morning she reported improvement to pain symptoms.  1. Pain related to metastatic breast cancer involving bone. -Patient having history of metastatic stage IV breast cancer, presented with severe pain involving bilateral hips right shoulder and "bone pain". -She had been on methadone, OxyContin, Percocet and MSIR -Case discussed with Dr. Domingo Cocking a palliative care who suggested keeping methadone while discontinuing other narcotics and starting Dilaudid PCA. -Her goal is to recover her functionality and go home after this hospitalization -On discharge her methadone was increased to 10 mg by mouth twice a day. She was started on Dilaudid 8 mg by mouth every 3 hours when necessary  with discontinuation of OxyContin, Percocet and MSIR.  -She was discharged on dexamethasone 4 mg 3 times a day, please  titrate -Please follow-up on repeat EKG on hospital follow-up visit  2. History of stage IV breast cancer. -She has multiple bony metastatic lesions seen on previous imaging studies. -He is currently being seen at the cancer center undergoing palliative radiation with letrozole and palbociclib   3. Type 2 diabetes mellitus -Blood sugars controlled, continue metformin 1000 mg by mouth twice a day  Consultations:  Palliative care  Discharge Exam: Filed Vitals:   09/21/15 0534 09/21/15 0812  BP: 129/73   Pulse: 64   Temp: 98.2 F (36.8 C)   Resp: 15 15    General: Patient is awake alert, thick she is feeling better today. Cardiovascular: Regular rate and rhythm normal S1-S2 Respiratory: Clear to auscultation bilaterally Abdomen: Soft nontender nondistended Musculoskeletal: She has pain with abduction and adduction of right shoulder, having some pain with passive and active movement of bilateral hips  Discharge Instructions   Discharge Instructions    Call MD for:  difficulty breathing, headache or visual disturbances    Complete by:  As directed      Call MD for:  extreme fatigue    Complete by:  As directed      Call MD for:  hives    Complete by:  As directed      Call MD for:  persistant dizziness or light-headedness    Complete by:  As directed      Call MD for:  persistant nausea and vomiting    Complete by:  As directed      Call MD for:  redness, tenderness, or signs of infection (pain, swelling, redness, odor or green/yellow discharge around incision site)    Complete by:  As directed      Call MD for:  severe uncontrolled pain    Complete by:  As directed      Call MD for:  temperature >100.4    Complete by:  As directed      Call MD for:    Complete by:  As directed      Diet - low sodium heart healthy    Complete by:  As directed      Increase activity slowly    Complete by:  As directed           Current Discharge Medication List    START taking  these medications   Details  dexamethasone (DECADRON) 4 MG tablet Take 1 tablet (4 mg total) by mouth 3 (three) times daily. Qty: 30 tablet, Refills: 0    lactulose (CHRONULAC) 10 GM/15ML solution Take 30 mLs (20 g total) by mouth 2 (two) times daily. Qty: 240 mL, Refills: 0      CONTINUE these medications which have CHANGED   Details  methadone (DOLOPHINE) 10 MG tablet Take 1 tablet (10 mg total) by mouth every 12 (twelve) hours. Qty: 30 tablet, Refills: 0      CONTINUE these medications which have NOT CHANGED   Details  albuterol (PROVENTIL HFA;VENTOLIN HFA) 108 (90 BASE) MCG/ACT inhaler Inhale 2 puffs into the lungs every 6 (six) hours as needed for wheezing. Reported on 09/01/2015    ferrous sulfate 325 (65 FE) MG tablet Take 325 mg by mouth daily with breakfast.     FLUoxetine (PROZAC) 20 MG tablet Take 20 mg by mouth every morning.    gabapentin (NEURONTIN) 300 MG capsule Take 2 capsules (600  mg total) by mouth 2 (two) times daily. Qty: 120 capsule, Refills: 5    letrozole (FEMARA) 2.5 MG tablet Take 1 tablet (2.5 mg total) by mouth daily. Qty: 90 tablet, Refills: 4    metFORMIN (GLUCOPHAGE) 1000 MG tablet Take 1,000 mg by mouth 2 (two) times daily with a meal.     Multiple Vitamin (MULTIVITAMIN WITH MINERALS) TABS tablet Take 1 tablet by mouth daily.    palbociclib (IBRANCE) 100 MG capsule Take 1 capsule (100 mg total) by mouth daily with breakfast. Take whole with food. Qty: 21 capsule, Refills: 2      STOP taking these medications     chlorthalidone (HYGROTON) 25 MG tablet      morphine (MSIR) 15 MG tablet      naproxen (NAPROSYN) 500 MG tablet      oxyCODONE-acetaminophen (PERCOCET/ROXICET) 5-325 MG tablet      OXYCONTIN 10 MG 12 hr tablet        Allergies  Allergen Reactions  . Nyquil Multi-Symptom [Pseudoeph-Doxylamine-Dm-Apap] Itching and Other (See Comments)    Skin peels on hands, and feet   Follow-up Information    Follow up with Elyn Peers,  MD In 2 weeks.   Specialty:  Family Medicine   Contact information:   [address] [place] [address] [phone]       Follow up with Chauncey Cruel, MD In 1 week.   Specialty:  Oncology   Contact information:   [place] [address] [phone]        The results of significant diagnostics from this hospitalization (including imaging, microbiology, ancillary and laboratory) are listed below for reference.    Significant Diagnostic Studies: Dg Tibia/fibula Left  08/27/2015  CLINICAL DATA:  Left tibia and fibular pain for 1 week EXAM: LEFT TIBIA AND FIBULA - 2 VIEW COMPARISON:  None. FINDINGS: No acute fracture. No dislocation. Unremarkable soft tissues. 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 There has been interval healing of the fracture at the tip of the lateral malleolus. IMPRESSION: Healed avulsion fracture at the tip of the fibula. Old fracture at the tip of the lateral malleolus. No acute bony injury. Electronically Signed   By: Marybelle Killings M.D.   On: 08/27/2015 16:59    Microbiology: No results found for this or any previous visit (from the past 240 hour(s)).   Labs: Basic Metabolic Panel:  Recent Labs Lab 09/15/15 1509 09/18/15 1347 09/19/15 0430  NA 137 138 136  K 4.3 3.8 5.0  CL  --  103 104  CO2 28 23 27   GLUCOSE 91 100* 138*  BUN 21.1 17 14   CREATININE 1.2* 1.23* 1.13*  CALCIUM 9.4 8.7* 8.5*   Liver Function Tests:  Recent Labs Lab 09/15/15 1509  AST 29  ALT 18  ALKPHOS 141  BILITOT <0.30  PROT 7.6  ALBUMIN 3.6   No results for input(s): LIPASE, AMYLASE in the last 168 hours. No results for input(s): AMMONIA in the last 168 hours. CBC:  Recent Labs Lab 09/15/15 1509 09/18/15 1347 09/19/15 0430  WBC 3.4* 3.3* 2.7*  NEUTROABS 2.4 2.5  --   HGB 10.0* 9.6* 9.0*  HCT 30.4* 28.9* 28.0*  MCV 89.7 86.3 88.3  PLT 185 243 225   Cardiac Enzymes: No results for input(s):  CKTOTAL, CKMB, CKMBINDEX, TROPONINI in the last 168 hours. BNP: BNP (last 3 results) No results for input(s): BNP in the last 8760 hours.  ProBNP (  last 3 results) No results for input(s): PROBNP in the last 8760 hours.  CBG:  Recent Labs Lab 09/19/15 2218 09/20/15 0757 09/20/15 1206 09/20/15 1712 09/20/15 2049  GLUCAP 113* 84 146* 119* 207*       Signed:  Kelvin Cellar MD.  [facility] 09/21/2015, 9:09 AM

## 2015-09-21 NOTE — Progress Notes (Signed)
Daily Progress Note   Patient Name: Shelly Hall       Date: 09/21/2015 DOB: [birth year]  Age: 45 y.o. MRN#: [id number] Attending Physician: Kelvin Cellar, MD Primary Care Physician: Elyn Peers, MD Admit Date: 09/18/2015  Reason for Consultation/Follow-up: Pain control  Subjective:  awake alert, eating breakfast. Complains of around 6-7/10 pain in hips and shoulder area.   Interval Events:  discussed pain management regimen with patient, Dr Coralyn Pear Pain pump interrogated, equi analgesic calculations done. Patient with dilaudid PCA and also on Methadone PO scheduled.  Agree with D/C summary pain regimen, recommend repeat EKG and consider Methadone 10 mg PO TID if uncontrolled pain persists. Methadone dose was recently increased, may not have had enough time to reach steady state. Agree with Dilaudid PO PRN for pain.  Flag patient for palliative f/u if re hospitalized in the future.   Length of Stay: 3 days  Current Medications: Scheduled Meds:  . dexamethasone  4 mg Oral TID  . docusate sodium  100 mg Oral BID  . enoxaparin (LOVENOX) injection  40 mg Subcutaneous Q24H  . feeding supplement (ENSURE ENLIVE)  237 mL Oral BID BM  . FLUoxetine  20 mg Oral Daily  . gabapentin  600 mg Oral BID  . HYDROmorphone   Intravenous 6 times per day  . insulin aspart  0-15 Units Subcutaneous TID WC  . insulin aspart  0-5 Units Subcutaneous QHS  . lactulose  20 g Oral BID  . letrozole  2.5 mg Oral Daily  . metFORMIN  1,000 mg Oral BID WC  . methadone  10 mg Oral Q12H  . multivitamin with minerals  1 tablet Oral Daily  . palbociclib  100 mg Oral Q breakfast  . polyethylene glycol  17 g Oral Daily    Continuous Infusions:    PRN Meds: albuterol, diphenhydrAMINE **OR** diphenhydrAMINE,  naloxone **AND** sodium chloride flush, ondansetron **OR** ondansetron (ZOFRAN) IV  Physical Exam: Physical Exam             Awake alert resting in bed S1 S2 Clear Abdomen soft No edema Non focal Appears with generalized weakness  Vital Signs: BP 129/73 mmHg  Pulse 64  Temp(Src) 98.2 F (36.8 C) (Oral)  Resp 15  SpO2 95%  LMP 12/07/2014 SpO2: SpO2: 95 % O2 Device: O2 Device:  Not Delivered O2 Flow Rate: O2 Flow Rate (L/min): 0 L/min  Intake/output summary:  Intake/Output Summary (Last 24 hours) at 09/21/15 0953 Last data filed at 09/20/15 1500  Gross per 24 hour  Intake   4805 ml  Output      0 ml  Net   4805 ml   LBM: Last BM Date: 09/20/15 Baseline Weight:   Most recent weight:         Palliative Assessment/Data: Flowsheet Rows        Most Recent Value   Intake Tab    Referral Department  Hospitalist   Unit at Time of Referral  Med/Surg Unit   Palliative Care Primary Diagnosis  Cancer   Palliative Care Type  Return patient Palliative Care   Reason for referral  Pain   Clinical Assessment    Palliative Performance Scale Score  40%   Pain Max last 24 hours  7   Pain Min Last 24 hours  4   Psychosocial & Spiritual Assessment    Palliative Care Outcomes    Patient/Family meeting held?  Yes   Who was at the meeting?  patient   Palliative Care follow-up planned  No      Additional Data Reviewed: CBC    Component Value Date/Time   WBC 2.7* 09/19/2015 0430   WBC 3.4* 09/15/2015 1509   RBC 3.17* 09/19/2015 0430   RBC 3.39* 09/15/2015 1509   HGB 9.0* 09/19/2015 0430   HGB 10.0* 09/15/2015 1509   HCT 28.0* 09/19/2015 0430   HCT 30.4* 09/15/2015 1509   PLT 225 09/19/2015 0430   PLT 185 09/15/2015 1509   MCV 88.3 09/19/2015 0430   MCV 89.7 09/15/2015 1509   MCH 28.4 09/19/2015 0430   MCH 29.5 09/15/2015 1509   MCHC 32.1 09/19/2015 0430   MCHC 32.9 09/15/2015 1509   RDW 16.4* 09/19/2015 0430   RDW 16.5* 09/15/2015 1509   LYMPHSABS 0.4* 09/18/2015 1347    LYMPHSABS 0.3* 09/15/2015 1509   MONOABS 0.4 09/18/2015 1347   MONOABS 0.6 09/15/2015 1509   EOSABS 0.1 09/18/2015 1347   EOSABS 0.1 09/15/2015 1509   BASOSABS 0.0 09/18/2015 1347   BASOSABS 0.0 09/15/2015 1509    CMP     Component Value Date/Time   NA 136 09/19/2015 0430   NA 137 09/15/2015 1509   K 5.0 09/19/2015 0430   K 4.3 09/15/2015 1509   CL 104 09/19/2015 0430   CO2 27 09/19/2015 0430   CO2 28 09/15/2015 1509   GLUCOSE 138* 09/19/2015 0430   GLUCOSE 91 09/15/2015 1509   BUN 14 09/19/2015 0430   BUN 21.1 09/15/2015 1509   CREATININE 1.13* 09/19/2015 0430   CREATININE 1.2* 09/15/2015 1509   CALCIUM 8.5* 09/19/2015 0430   CALCIUM 9.4 09/15/2015 1509   PROT 7.6 09/15/2015 1509   PROT 8.0 12/02/2014 1509   ALBUMIN 3.6 09/15/2015 1509   ALBUMIN 4.0 12/02/2014 1509   AST 29 09/15/2015 1509   AST 24 12/02/2014 1509   ALT 18 09/15/2015 1509   ALT 23 12/02/2014 1509   ALKPHOS 141 09/15/2015 1509   ALKPHOS 76 12/02/2014 1509   BILITOT <0.30 09/15/2015 1509   BILITOT 0.2* 12/02/2014 1509   GFRNONAA 57* 09/19/2015 0430   GFRAA >60 09/19/2015 0430       Problem List:  Patient Active Problem List   Diagnosis Date Noted  . Metastatic cancer ([place]) 09/18/2015  . Cancer associated pain 09/18/2015  . Pain from bone metastases ([place]) 08/27/2015  .  Recurrent cancer of left breast ([place]) 08/07/2015  . 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 Genetic testing 04/09/2015  . Lymphedema of arm 02/18/2015  . S/P hysterectomy 12/09/2014  . Lymphedema 11/17/2014  . Gum symptoms 03/18/2014  . Peripheral neuropathy due to chemotherapy ([place]) 01/22/2014  . Hot flashes 01/15/2014  . 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 Esophageal reflux 11/27/2013  . 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 Recurrent genital herpes 11/06/2013  . Chemotherapy induced neutropenia ([place]) 11/06/2013  . Anemia, unspecified 11/06/2013  . Diabetes mellitus type  II, non insulin dependent ([place]) 11/06/2013  . Left thyroid nodule 11/06/2013  . Sarcoidosis ([place]) 10/15/2013  . Breast cancer of upper-outer quadrant of left female breast ([place]) 10/10/2013  . Mediastinal lymphadenopathy 11/21/2012  . Tobacco use disorder 11/21/2012     Palliative Care Assessment & Plan    1.Code Status:  Full code    Code Status Orders        Start     Ordered   09/18/15 1807  Full code   Continuous     09/18/15 1806    Code Status History    Date Active Date Inactive Code Status Order ID Comments User Context   12/09/2014  2:42 PM 12/10/2014  4:45 PM Full Code [id number]  Cheri Fowler, MD Inpatient   04/23/2014  3:41 PM 04/25/2014  6:38 PM Full Code [id number]  [name], MD Inpatient       2. Goals of Care/Additional Recommendations:  Desire for further Chaplaincy support:no  Psycho-social Needs: Caregiving  Support/Resources  3. Symptom Management:      1. As above   4. Palliative Prophylaxis:   Bowel Regimen  5. Prognosis: Unable to determine  6. Discharge Planning:  Home with [place] was discussed with  Patient, Dr Coralyn Pear.   Thank you for allowing the Palliative Medicine Team to assist in the care of this patient.   Time In:  9 Time Out: 935 Total Time 35 Prolonged Time Billed  no        [id number] Loistine Chance, MD  09/21/2015, 9:53 AM  Please contact Palliative Medicine Team phone at [phone] for questions and concerns.

## 2015-09-21 NOTE — Consult Note (Addendum)
Consultation Note Date: 09/21/2015   Patient Name: Shelly Hall  DOB: 1971/05/22  MRN: [id number]  Age / Sex: 45 y.o., female  PCP: Lucianne Lei, MD Referring Physician: Kelvin Cellar, MD  Reason for Consultation: Pain control  Clinical Assessment/Narrative: 45 year old female admitted with pain related to metastatic breast cancer.  Pain involves bilateral hips, right shoulder.  She reports being on an OP regimen of methadone, oxycodone, and MS Contin.  Takes 5mg  methadone TID, 15mg  MS Contin and 5 10mg  oxycodone daily.  Pain has been steadily getting worse over the last few days prior to admission.  Palliative consulted for pain management.  She reports pain control has been much better since admission and initiation of PCA.  She has used approximately 9mg  IV dilaudid in the last 24 hours.  Her ECG revealed QTc of 450.  SUMMARY OF RECOMMENDATIONS - Discussed options for pain management moving forward.  I agree with addition of steroids for bone pain. - She is currently on methadone as long acting agent.  Her QTc is 450, which may become a factor in ability to titrate methadone moving forward.  Will plan to increase to 10mg  BID, and recommend recheck ECG due do borderline QTc. - Will continue PCA overnight and plan to transition to oral regimen tomorrow.  With increase in methadone, I would not anticipate her reaching steady state and being pain free tomorrow.   - We talked a little about her illness and advance care planning.  She is able to verbalize that this is not a curable illness. - She will consider who she would like to name as her surrogate decision maker.  She has two children and reports it may or may not be them.  She would like to talk with them. - I left copy of [facility] for [place] for her to review.  Code Status/Advance Care Planning: Full code    Code Status Orders        Start      Ordered   09/18/15 1807  Full code   Continuous     09/18/15 1806    Code Status History    Date Active Date Inactive Code Status Order ID Comments User Context   12/09/2014  2:42 PM 12/10/2014  4:45 PM Full Code [id number]  Cheri Fowler, MD Inpatient   04/23/2014  3:41 PM 04/25/2014  6:38 PM Full Code [id number]  [name], MD Inpatient     Symptom Management:   As above  Palliative Prophylaxis:   Bowel Regimen  Psycho-social/Spiritual:  Support System: [place] Desire for further Chaplaincy support:Not at this time Additional Recommendations: Caregiving  Support/Resources  Prognosis: Unable to determine  Discharge Planning: [place] for rehab with Palliative care service follow-up.  She is agreeable to rehab and would benefit from palliative f/u to continue to titrate her pain regimen.   Chief Complaint/ Primary Diagnoses: Present on Admission:  . Breast cancer of upper-outer quadrant of left female breast ([place]) . Peripheral neuropathy due to chemotherapy ([place]) . 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 Mediastinal lymphadenopathy . Metastatic cancer ([place]) . Cancer associated pain  I have reviewed the medical record, interviewed the patient and family, and examined the patient. The following aspects are pertinent.  Past Medical History  Diagnosis Date  . Hypertension   . Sarcoidosis ([place])     no problems per patient   . Anemia   . Complication of anesthesia     makes pt.  restless and unable to be still  . High cholesterol   . Thyroid cancer ([place]) 2015  . Type II diabetes mellitus ([place])   . Breast cancer ([place]) 2015    left upper outer;  had chemo  . Radiation 06/23/14-08/11/14    Invasive ductal ca o left breast  . SVD (spontaneous vaginal delivery)     x 2  . Heart murmur     never had any problems as adult  . Bronchitis 2014  . Depression     no meds currently  . Anxiety     no meds currently  . GERD (gastroesophageal reflux  disease)    Social History   Social History  . Marital Status: Single    Spouse Name: N/A  . Number of Children: 2  . Years of Education: N/A   Occupational History  . Hab Tech    Social History Main Topics  . Smoking status: Current Some Day Smoker -- 0.25 packs/day for 25 years    Types: Cigarettes  . Smokeless tobacco: Never Used     Comment: 04/23/2014 "using vapor; down to 3-4 cigarettes/day now"  . Alcohol Use: 6.0 oz/week    4 Cans of beer, 4 Shots of liquor, 2 Standard drinks or equivalent per week  . Drug Use: No  . Sexual Activity: Not Currently    Birth Control/ Protection: None     Comment: hx chemo   Other Topics Concern  . None   Social History Narrative   Family History  Problem Relation Age of Onset  . Cancer Paternal Aunt     "bone cancer"; deceased 22s  . Cancer Paternal Uncle     stomach cancer; deceased 71s  . Cancer Paternal Aunt     unk. primary; currently 71s  . Prostate cancer Maternal Grandfather 48  . Asthma Mother    Scheduled Meds: . dexamethasone  4 mg Oral TID  . docusate sodium  100 mg Oral BID  . enoxaparin (LOVENOX) injection  40 mg Subcutaneous Q24H  . feeding supplement (ENSURE ENLIVE)  237 mL Oral BID BM  . FLUoxetine  20 mg Oral Daily  . gabapentin  600 mg Oral BID  . HYDROmorphone   Intravenous 6 times per day  . insulin aspart  0-15 Units Subcutaneous TID WC  . insulin aspart  0-5 Units Subcutaneous QHS  . lactulose  20 g Oral BID  . letrozole  2.5 mg Oral Daily  . metFORMIN  1,000 mg Oral BID WC  . methadone  10 mg Oral Q12H  . multivitamin with minerals  1 tablet Oral Daily  . palbociclib  100 mg Oral Q breakfast  . polyethylene glycol  17 g Oral Daily   Continuous Infusions:  PRN Meds:.albuterol, diphenhydrAMINE **OR** diphenhydrAMINE, naloxone **AND** sodium chloride flush, ondansetron **OR** ondansetron (ZOFRAN) IV Medications Prior to Admission:  Prior to Admission medications   Medication Sig Start Date End  Date Taking? Authorizing Provider  albuterol (PROVENTIL HFA;VENTOLIN HFA) 108 (90 BASE) MCG/ACT inhaler Inhale 2 puffs into the lungs every 6 (six) hours as needed for wheezing. Reported on 09/01/2015   Yes Historical Provider, MD  chlorthalidone (HYGROTON) 25 MG tablet Take 12.5 mg by mouth daily.   Yes Historical Provider, MD  ferrous sulfate 325 (65 FE) MG tablet Take 325 mg by mouth daily with breakfast.    Yes Historical Provider, MD  FLUoxetine (PROZAC) 20 MG tablet Take 20 mg by mouth every morning.   Yes Historical Provider, MD  gabapentin (NEURONTIN) 300 MG capsule Take 2 capsules (600 mg total) by mouth 2 (two) times daily. Patient taking differently: Take 300-600 mg by mouth 3 (three) times daily. Take 300mg  in the morning and afternoon and 600mg  in the evening 02/18/15  Yes Laurie Panda, NP  letrozole [facility]) 2.5 MG tablet Take 1 tablet (2.5 mg total) by mouth daily. 08/06/15  Yes Chauncey Cruel, MD  metFORMIN (GLUCOPHAGE) 1000 MG tablet Take 1,000 mg by mouth 2 (two) times daily with a meal.  04/13/14  Yes Historical Provider, MD  methadone (DOLOPHINE) 5 MG tablet Take 1 tablet (5 mg total) by mouth every 8 (eight) hours. 08/27/15  Yes Chauncey Cruel, MD  morphine (MSIR) 15 MG tablet Take 15 mg by mouth 2 (two) times daily.   Yes Historical Provider, MD  Multiple Vitamin (MULTIVITAMIN WITH MINERALS) TABS tablet Take 1 tablet by mouth daily.   Yes Historical Provider, MD  naproxen (NAPROSYN) 500 MG tablet Take 1 tablet (500 mg total) by mouth 3 (three) times daily with meals. 07/23/15  Yes Chauncey Cruel, MD  oxyCODONE-acetaminophen (PERCOCET/ROXICET) 5-325 MG tablet Take 1 tablet by mouth every 6 (six) hours as needed for severe pain. 08/24/15  Yes Thea Silversmith, MD  OXYCONTIN 10 MG 12 hr tablet Take 10 mg by mouth every 8 (eight) hours. 09/08/15  Yes Historical Provider, MD  palbociclib (IBRANCE) 100 MG capsule Take 1 capsule (100 mg total) by mouth daily with breakfast. Take  whole with food. 09/01/15  Yes Laurie Panda, NP   Allergies  Allergen Reactions  . Nyquil Multi-Symptom [Pseudoeph-Doxylamine-Dm-Apap] Itching and Other (See Comments)    Skin peels on hands, and feet    Review of Systems Reports fatigue, back pain, poor sleep, occasional constipation. Otherwise ROS negative  Physical Exam  General: Alert, awake, in no acute distress. One episode of pain with shifting in her chair  HEENT: No bruits, no goiter, no JVD Heart: Regular rate and rhythm. No murmur appreciated. Lungs: Good air movement, clear Abdomen: Soft, nontender, nondistended, positive bowel sounds.  Ext: No significant edema Skin: Warm and dry Neuro: Grossly intact, nonfocal. Vital Signs: BP 118/67 mmHg  Pulse 72  Temp(Src) 97.4 F (36.3 C) (Oral)  Resp 16  SpO2 93%  LMP 12/07/2014  SpO2: SpO2: 93 % O2 Device:SpO2: 93 % O2 Flow Rate: .O2 Flow Rate (L/min): 0 L/min  IO: Intake/output summary:  Intake/Output Summary (Last 24 hours) at 09/21/15 0518 Last data filed at 09/20/15 1500  Gross per 24 hour  Intake   5045 ml  Output    600 ml  Net   4445 ml    LBM: Last BM Date: 09/20/15 Baseline Weight:   Most recent weight:        Palliative Assessment/Data:    Additional Data Reviewed:  CBC:    Component Value Date/Time   WBC 2.7* 09/19/2015 0430   WBC 3.4* 09/15/2015 1509   HGB 9.0* 09/19/2015 0430   HGB 10.0* 09/15/2015 1509   HCT 28.0* 09/19/2015 0430   HCT 30.4* 09/15/2015 1509   PLT 225 09/19/2015 0430   PLT 185 09/15/2015 1509   MCV 88.3 09/19/2015 0430   MCV 89.7 09/15/2015 1509   NEUTROABS 2.5 09/18/2015 1347   NEUTROABS 2.4 09/15/2015 1509   LYMPHSABS 0.4* 09/18/2015 1347   LYMPHSABS 0.3* 09/15/2015 1509   MONOABS 0.4 09/18/2015 1347   MONOABS 0.6 09/15/2015 1509   EOSABS 0.1 09/18/2015 1347   EOSABS 0.1 09/15/2015 1509  BASOSABS 0.0 09/18/2015 1347   BASOSABS 0.0 09/15/2015 1509   Comprehensive Metabolic Panel:    Component Value  Date/Time   NA 136 09/19/2015 0430   NA 137 09/15/2015 1509   K 5.0 09/19/2015 0430   K 4.3 09/15/2015 1509   CL 104 09/19/2015 0430   CO2 27 09/19/2015 0430   CO2 28 09/15/2015 1509   BUN 14 09/19/2015 0430   BUN 21.1 09/15/2015 1509   CREATININE 1.13* 09/19/2015 0430   CREATININE 1.2* 09/15/2015 1509   GLUCOSE 138* 09/19/2015 0430   GLUCOSE 91 09/15/2015 1509   CALCIUM 8.5* 09/19/2015 0430   CALCIUM 9.4 09/15/2015 1509   AST 29 09/15/2015 1509   AST 24 12/02/2014 1509   ALT 18 09/15/2015 1509   ALT 23 12/02/2014 1509   ALKPHOS 141 09/15/2015 1509   ALKPHOS 76 12/02/2014 1509   BILITOT <0.30 09/15/2015 1509   BILITOT 0.2* 12/02/2014 1509   PROT 7.6 09/15/2015 1509   PROT 8.0 12/02/2014 1509   ALBUMIN 3.6 09/15/2015 1509   ALBUMIN 4.0 12/02/2014 1509     Time In: 1045 Time Out: 1200 Time Total: 75 Greater than 50%  of this time was spent counseling and coordinating care related to the above assessment and plan.  Signed by: Micheline Rough, MD  Micheline Rough, MD  09/21/2015, 5:18 AM  Please contact Palliative Medicine Team phone at [phone] for questions and concerns.

## 2015-09-21 NOTE — Progress Notes (Signed)
Above note original waste by Kirkland Hun, RN. Witnessed by Shelton Silvas, RN

## 2015-09-21 NOTE — Care Management Note (Addendum)
Case Management Note  Patient Details  Name: Shelly Hall MRN: [id number] Date of Birth: [birth year]  Subjective/Objective:     45 yo with breast CA               Action/Plan: From home with family  Expected Discharge Date:                  Expected Discharge Plan:  [place]  In-House Referral:     Discharge planning Services  CM Consult  Post Acute Care Choice:    Choice offered to:  Patient  DME Arranged:  3-N-1, Walker rolling DME Agency:     HH Arranged:  RN, NA HH Agency:  [facility]  Status of Service:  In process, will continue to follow  Medicare Important Message Given:    Date Medicare IM Given:    Medicare IM give by:    Date Additional Medicare IM Given:    Additional Medicare Important Message give by:     If discussed at [place] of Stay Meetings, dates discussed:    Additional Comments: Pt offered choice for [facility] services and chose [place].  Pt has medicaid and is not able to get PT/OT. Pt able to receive RN and aide.  [facility] rep alerted of referral.  DME rep alerted of orders for 3in1 and RW. Lynnell Catalan, RN 09/21/2015, 12:51 PM [phone]

## 2015-09-21 NOTE — Progress Notes (Signed)
Wasted 3 mL of 1:1 Diluauded from patient's PCA.  Witnessed by Shelton Silvas, RN

## 2015-09-21 NOTE — Progress Notes (Addendum)
[facility]   [facility] is providing the following services: Rec'd order for rw, however patient rec'd a cane 09/04/2015 and Medicaid will not cover the cost of a rolling walker.  Same or similar.  Will send commode to patient's home.   If patient discharges after hours, please call [phone].   Linward Headland 09/21/2015, 11:04 AM

## 2015-09-21 NOTE — Progress Notes (Signed)
Occupational Therapy Evaluation Patient Details Name: Shelly Hall MRN: [id number] DOB: 02-11-1971 Today's Date: 09/21/2015    History of Present Illness -patient admitted 09/18/15 .Patient having history of metastatic stage IV breast cancer, presented with severe pain involving bilateral hips right shoulder and "bone pain. metastic to bilateral pelvis, R clavicle   Clinical Impression   Patient dizzy with ambulation to/from bathroom with cane for toileting/grooming tasks. Returned to bed at end of session. She reports she is going home today; therefore, OT did not make treatment plan. She will have assistance of family and possibly a [place] aide at discharge.     Follow Up Recommendations  No OT follow up;Supervision/Assistance - 24 hour;Other (comment) ([place] aide )    Equipment Recommendations  3 in 1 bedside comode    Recommendations for Other Services       Precautions / Restrictions Precautions Precautions: Fall Precaution Comments: R foot drop Restrictions Weight Bearing Restrictions: No      Mobility Bed Mobility Overal bed mobility: Needs Assistance Bed Mobility: Supine to Sit     Supine to sit: Supervision Sit to supine: Min guard      Transfers Overall transfer level: Needs assistance Equipment used: Straight cane Transfers: Sit to/from Stand Sit to Stand: Min guard         General transfer comment: for safety    Balance                                            ADL Overall ADL's : Needs assistance/impaired Eating/Feeding: Independent;Sitting   Grooming: Wash/dry hands;Min guard;Standing               Lower Body Dressing: Set up Lower Body Dressing Details (indicate cue type and reason): don and doff socks Toilet Transfer: Min guard;Ambulation;Regular Toilet;Grab bars (cane)   Toileting- Clothing Manipulation and Hygiene: Min guard;Sit to/from stand       Functional mobility during ADLs: Min guard;Cane General ADL  Comments: Patient had 2 dizzy spells -- one on the way to bathroom and one on the way back, had to take standing rest breaks. They seemed to pass. Returned to bed at end of session.     Vision     Perception     Praxis      Pertinent Vitals/Pain Pain Assessment: 0-10 Pain Score: 5  Pain Location: B legs, R shoulder Pain Descriptors / Indicators: Aching;Sore Pain Intervention(s): Limited activity within patient's tolerance;Monitored during session;Repositioned     Hand Dominance Right   Extremity/Trunk Assessment Upper Extremity Assessment Upper Extremity Assessment: RUE deficits/detail RUE Deficits / Details: decreased shoulder elevation   Lower Extremity Assessment Lower Extremity Assessment: Defer to PT evaluation       Communication Communication Communication: No difficulties   Cognition Arousal/Alertness: Awake/alert Behavior During Therapy: WFL for tasks assessed/performed Overall Cognitive Status: Within Functional Limits for tasks assessed                     General Comments       Exercises       Shoulder Instructions      Home Living Family/patient expects to be discharged to:: Private residence Living Arrangements: Other relatives Available Help at Discharge: Family Type of Home: Apartment Home Access: Stairs to enter Technical brewer of Steps: 2 Entrance Stairs-Rails: None Home Layout: One level     Bathroom Shower/Tub:  Tub/shower unit   Bathroom Toilet: Standard     Home Equipment: Cane - single point;Tub bench   Additional Comments: gouing to aunt's house      Prior Functioning/Environment Level of Independence: Independent with assistive device(s)        Comments: cane    OT Diagnosis: Generalized weakness;Acute pain   OT Problem List: Decreased strength;Decreased activity tolerance;Impaired balance (sitting and/or standing);Decreased knowledge of use of DME or AE;Pain;Impaired UE functional use   OT  Treatment/Interventions:      OT Goals(Current goals can be found in the care plan section) Acute Rehab OT Goals Patient Stated Goal: to go home today OT Goal Formulation: All assessment and education complete, DC therapy  OT Frequency:     Barriers to D/C:            Co-evaluation              End of Session Equipment Utilized During Treatment: Rolling walker  Activity Tolerance: Patient tolerated treatment well Patient left: in bed;with call bell/phone within reach;with bed alarm set   Time: 1131-1147 OT Time Calculation (min): 16 min Charges:  OT General Charges $OT Visit: 1 Procedure OT Evaluation $OT Eval Low Complexity: 1 Procedure G-Codes:    Dafney Farler A 10-13-15, 1:39 PM

## 2015-09-23 ENCOUNTER — Encounter: Payer: Self-pay | Admitting: *Deleted

## 2015-09-23 NOTE — Progress Notes (Signed)
[place] Work  Clinical Social Work was referred by need for CSW follow up as pt was recently hospitalized. CSW phoned pt at home to offer support and assess for needs. CSW had reviewed ADR packet with pt in the past, but she was not ready to complete at that time. PCS referral through medicaid had also been completed. Pt reports she had been contacted by [facility] [place], but home assessment had not been completed to date due to her hospitalization. Pt would like to meet at next appt to complete ADRs. Pt reports [place] referral was also completed through RN CM while inpt. Pt then stated RN was there from a [place] and needed to get off the phone. CSW plans to check in on pt at next visit and encouraged pt to reach out to CSW as needed.   Clinical Social Work interventions: Supportive listening' Check in  Loren Racer, [place] Social Worker [place]  [place] Phone: [phone] Fax: [phone]

## 2015-09-28 ENCOUNTER — Inpatient Hospital Stay (HOSPITAL_COMMUNITY)
Admission: EM | Admit: 2015-09-28 | Discharge: 2015-10-03 | DRG: 948 | Disposition: A | Discharge status: Home Health | Payer: Medicaid Other | Attending: Family Medicine | Admitting: Family Medicine

## 2015-09-28 ENCOUNTER — Encounter (HOSPITAL_COMMUNITY): Payer: Self-pay | Admitting: *Deleted

## 2015-09-28 DIAGNOSIS — G622 Polyneuropathy due to other toxic agents: Secondary | ICD-10-CM | POA: Diagnosis present

## 2015-09-28 DIAGNOSIS — C7951 Secondary malignant neoplasm of bone: Secondary | ICD-10-CM | POA: Diagnosis present

## 2015-09-28 DIAGNOSIS — K5900 Constipation, unspecified: Secondary | ICD-10-CM | POA: Diagnosis not present

## 2015-09-28 DIAGNOSIS — Z7952 Long term (current) use of systemic steroids: Secondary | ICD-10-CM

## 2015-09-28 DIAGNOSIS — Z515 Encounter for palliative care: Secondary | ICD-10-CM | POA: Insufficient documentation

## 2015-09-28 DIAGNOSIS — G893 Neoplasm related pain (acute) (chronic): Principal | ICD-10-CM | POA: Diagnosis present

## 2015-09-28 DIAGNOSIS — F419 Anxiety disorder, unspecified: Secondary | ICD-10-CM | POA: Diagnosis present

## 2015-09-28 DIAGNOSIS — K5730 Diverticulosis of large intestine without perforation or abscess without bleeding: Secondary | ICD-10-CM | POA: Insufficient documentation

## 2015-09-28 DIAGNOSIS — Z79891 Long term (current) use of opiate analgesic: Secondary | ICD-10-CM

## 2015-09-28 DIAGNOSIS — K219 Gastro-esophageal reflux disease without esophagitis: Secondary | ICD-10-CM | POA: Diagnosis present

## 2015-09-28 DIAGNOSIS — Z808 Family history of malignant neoplasm of other organs or systems: Secondary | ICD-10-CM

## 2015-09-28 DIAGNOSIS — Z9012 Acquired absence of left breast and nipple: Secondary | ICD-10-CM

## 2015-09-28 DIAGNOSIS — Z79899 Other long term (current) drug therapy: Secondary | ICD-10-CM

## 2015-09-28 DIAGNOSIS — Z825 Family history of asthma and other chronic lower respiratory diseases: Secondary | ICD-10-CM

## 2015-09-28 DIAGNOSIS — C801 Malignant (primary) neoplasm, unspecified: Secondary | ICD-10-CM

## 2015-09-28 DIAGNOSIS — D649 Anemia, unspecified: Secondary | ICD-10-CM

## 2015-09-28 DIAGNOSIS — R52 Pain, unspecified: Secondary | ICD-10-CM | POA: Diagnosis present

## 2015-09-28 DIAGNOSIS — E119 Type 2 diabetes mellitus without complications: Secondary | ICD-10-CM

## 2015-09-28 DIAGNOSIS — Z7984 Long term (current) use of oral hypoglycemic drugs: Secondary | ICD-10-CM

## 2015-09-28 DIAGNOSIS — N9489 Other specified conditions associated with female genital organs and menstrual cycle: Secondary | ICD-10-CM

## 2015-09-28 DIAGNOSIS — C50412 Malignant neoplasm of upper-outer quadrant of left female breast: Secondary | ICD-10-CM | POA: Diagnosis present

## 2015-09-28 DIAGNOSIS — Z888 Allergy status to other drugs, medicaments and biological substances status: Secondary | ICD-10-CM

## 2015-09-28 DIAGNOSIS — I10 Essential (primary) hypertension: Secondary | ICD-10-CM | POA: Diagnosis present

## 2015-09-28 DIAGNOSIS — D638 Anemia in other chronic diseases classified elsewhere: Secondary | ICD-10-CM | POA: Diagnosis present

## 2015-09-28 DIAGNOSIS — Z79811 Long term (current) use of aromatase inhibitors: Secondary | ICD-10-CM

## 2015-09-28 DIAGNOSIS — Z90710 Acquired absence of both cervix and uterus: Secondary | ICD-10-CM

## 2015-09-28 DIAGNOSIS — R0781 Pleurodynia: Secondary | ICD-10-CM | POA: Diagnosis present

## 2015-09-28 DIAGNOSIS — D869 Sarcoidosis, unspecified: Secondary | ICD-10-CM | POA: Diagnosis present

## 2015-09-28 DIAGNOSIS — C799 Secondary malignant neoplasm of unspecified site: Secondary | ICD-10-CM

## 2015-09-28 DIAGNOSIS — Z923 Personal history of irradiation: Secondary | ICD-10-CM

## 2015-09-28 DIAGNOSIS — Z85850 Personal history of malignant neoplasm of thyroid: Secondary | ICD-10-CM

## 2015-09-28 DIAGNOSIS — F3189 Other bipolar disorder: Secondary | ICD-10-CM | POA: Diagnosis present

## 2015-09-28 DIAGNOSIS — E7800 Pure hypercholesterolemia, unspecified: Secondary | ICD-10-CM | POA: Diagnosis present

## 2015-09-28 DIAGNOSIS — Z170 Estrogen receptor positive status [ER+]: Secondary | ICD-10-CM

## 2015-09-28 DIAGNOSIS — Z800 Family history of malignant neoplasm of digestive organs: Secondary | ICD-10-CM

## 2015-09-28 DIAGNOSIS — F17210 Nicotine dependence, cigarettes, uncomplicated: Secondary | ICD-10-CM | POA: Diagnosis present

## 2015-09-28 DIAGNOSIS — T451X5A Adverse effect of antineoplastic and immunosuppressive drugs, initial encounter: Secondary | ICD-10-CM | POA: Diagnosis present

## 2015-09-28 LAB — CBC WITH DIFFERENTIAL/PLATELET
BASOS ABS: 0 10*3/uL (ref 0.0–0.1)
Basophils Relative: 0 %
EOS PCT: 3 %
Eosinophils Absolute: 0.1 10*3/uL (ref 0.0–0.7)
HCT: 29.4 % — ABNORMAL LOW (ref 36.0–46.0)
HEMOGLOBIN: 9.7 g/dL — AB (ref 12.0–15.0)
LYMPHS PCT: 18 %
Lymphs Abs: 0.6 10*3/uL — ABNORMAL LOW (ref 0.7–4.0)
MCH: 29.3 pg (ref 26.0–34.0)
MCHC: 33 g/dL (ref 30.0–36.0)
MCV: 88.8 fL (ref 78.0–100.0)
Monocytes Absolute: 0.2 10*3/uL (ref 0.1–1.0)
Monocytes Relative: 5 %
NEUTROS ABS: 2.5 10*3/uL (ref 1.7–7.7)
NEUTROS PCT: 74 %
PLATELETS: 284 10*3/uL (ref 150–400)
RBC: 3.31 MIL/uL — AB (ref 3.87–5.11)
RDW: 17.5 % — ABNORMAL HIGH (ref 11.5–15.5)
WBC: 3.4 10*3/uL — AB (ref 4.0–10.5)

## 2015-09-28 LAB — BASIC METABOLIC PANEL
ANION GAP: 9 (ref 5–15)
BUN: 23 mg/dL — AB (ref 6–20)
CO2: 29 mmol/L (ref 22–32)
Calcium: 8.8 mg/dL — ABNORMAL LOW (ref 8.9–10.3)
Chloride: 103 mmol/L (ref 101–111)
Creatinine, Ser: 1.18 mg/dL — ABNORMAL HIGH (ref 0.44–1.00)
GFR, EST NON AFRICAN AMERICAN: 54 mL/min — AB (ref >60)
Glucose, Bld: 94 mg/dL (ref 65–99)
POTASSIUM: 4.2 mmol/L (ref 3.5–5.1)
SODIUM: 141 mmol/L (ref 135–145)

## 2015-09-28 LAB — GLUCOSE, CAPILLARY: Glucose-Capillary: 170 mg/dL — ABNORMAL HIGH (ref 65–99)

## 2015-09-28 MED ORDER — GABAPENTIN 300 MG PO CAPS
600.0000 mg | ORAL_CAPSULE | Freq: Every day | ORAL | Status: DC
  Administered 2015-09-28 – 2015-10-02 (×5): 600 mg via ORAL
  Filled 2015-09-28 (×5): qty 2

## 2015-09-28 MED ORDER — ADULT MULTIVITAMIN W/MINERALS CH
1.0000 | ORAL_TABLET | Freq: Every day | ORAL | Status: DC
  Administered 2015-09-28 – 2015-10-03 (×6): 1 via ORAL
  Filled 2015-09-28 (×6): qty 1

## 2015-09-28 MED ORDER — HYDROMORPHONE HCL 2 MG/ML IJ SOLN
2.0000 mg | INTRAMUSCULAR | Status: AC | PRN
  Administered 2015-09-28 (×2): 2 mg via INTRAVENOUS
  Filled 2015-09-28 (×2): qty 1

## 2015-09-28 MED ORDER — METFORMIN HCL 500 MG PO TABS
1000.0000 mg | ORAL_TABLET | Freq: Two times a day (BID) | ORAL | Status: DC
  Administered 2015-09-28 – 2015-10-03 (×10): 1000 mg via ORAL
  Filled 2015-09-28 (×10): qty 2

## 2015-09-28 MED ORDER — ONDANSETRON HCL 4 MG/2ML IJ SOLN: 4.0000 mg | Freq: Four times a day (QID) | INTRAMUSCULAR | Status: DC | PRN

## 2015-09-28 MED ORDER — ALBUTEROL SULFATE (2.5 MG/3ML) 0.083% IN NEBU: 3.0000 mL | INHALATION_SOLUTION | Freq: Four times a day (QID) | RESPIRATORY_TRACT | Status: DC | PRN

## 2015-09-28 MED ORDER — LACTULOSE 10 GM/15ML PO SOLN
20.0000 g | Freq: Two times a day (BID) | ORAL | Status: DC
  Administered 2015-09-28 – 2015-10-01 (×2): 20 g via ORAL
  Filled 2015-09-28 (×6): qty 30

## 2015-09-28 MED ORDER — ENOXAPARIN SODIUM 40 MG/0.4ML ~~LOC~~ SOLN
40.0000 mg | SUBCUTANEOUS | Status: DC
  Administered 2015-09-28 – 2015-10-02 (×5): 40 mg via SUBCUTANEOUS
  Filled 2015-09-28 (×5): qty 0.4

## 2015-09-28 MED ORDER — PALBOCICLIB 100 MG PO CAPS
100.0000 mg | ORAL_CAPSULE | Freq: Every day | ORAL | Status: DC
  Administered 2015-09-28: 100 mg via ORAL

## 2015-09-28 MED ORDER — SODIUM CHLORIDE 0.9 % IV SOLN: 250.0000 mL | INTRAVENOUS | Status: DC | PRN

## 2015-09-28 MED ORDER — SODIUM CHLORIDE 0.9% FLUSH
3.0000 mL | Freq: Two times a day (BID) | INTRAVENOUS | Status: DC
  Administered 2015-10-01 – 2015-10-03 (×3): 3 mL via INTRAVENOUS

## 2015-09-28 MED ORDER — SODIUM CHLORIDE 0.9 % IV SOLN
INTRAVENOUS | Status: AC
  Administered 2015-09-28: 17:00:00 via INTRAVENOUS

## 2015-09-28 MED ORDER — GABAPENTIN 300 MG PO CAPS
300.0000 mg | ORAL_CAPSULE | Freq: Two times a day (BID) | ORAL | Status: DC
  Administered 2015-09-29 – 2015-10-03 (×10): 300 mg via ORAL
  Filled 2015-09-28 (×10): qty 1

## 2015-09-28 MED ORDER — MORPHINE SULFATE (PF) 2 MG/ML IV SOLN
2.0000 mg | INTRAVENOUS | Status: DC | PRN
  Administered 2015-09-29 (×2): 2 mg via INTRAVENOUS
  Filled 2015-09-28 (×2): qty 1

## 2015-09-28 MED ORDER — LETROZOLE 2.5 MG PO TABS
2.5000 mg | ORAL_TABLET | Freq: Every day | ORAL | Status: DC
  Administered 2015-09-28 – 2015-10-03 (×6): 2.5 mg via ORAL
  Filled 2015-09-28 (×6): qty 1

## 2015-09-28 MED ORDER — DOCUSATE SODIUM 100 MG PO CAPS
100.0000 mg | ORAL_CAPSULE | Freq: Two times a day (BID) | ORAL | Status: DC
Start: 2015-09-28 — End: 2015-10-03
  Administered 2015-09-28 – 2015-10-03 (×10): 100 mg via ORAL
  Filled 2015-09-28 (×10): qty 1

## 2015-09-28 MED ORDER — HYDROMORPHONE HCL 4 MG PO TABS: 8.0000 mg | ORAL_TABLET | ORAL | Status: DC | PRN

## 2015-09-28 MED ORDER — MORPHINE SULFATE ER 15 MG PO TBCR
15.0000 mg | EXTENDED_RELEASE_TABLET | Freq: Two times a day (BID) | ORAL | Status: DC
Start: 2015-09-28 — End: 2015-09-29
  Administered 2015-09-28: 15 mg via ORAL
  Filled 2015-09-28: qty 1

## 2015-09-28 MED ORDER — ONDANSETRON HCL 4 MG PO TABS: 4.0000 mg | ORAL_TABLET | Freq: Four times a day (QID) | ORAL | Status: DC | PRN

## 2015-09-28 MED ORDER — DEXAMETHASONE 4 MG PO TABS
4.0000 mg | ORAL_TABLET | Freq: Three times a day (TID) | ORAL | Status: DC
  Administered 2015-09-28 – 2015-09-29 (×3): 4 mg via ORAL
  Filled 2015-09-28 (×4): qty 1

## 2015-09-28 MED ORDER — FERROUS SULFATE 325 (65 FE) MG PO TABS
325.0000 mg | ORAL_TABLET | Freq: Every day | ORAL | Status: DC
  Administered 2015-09-29 – 2015-10-03 (×5): 325 mg via ORAL
  Filled 2015-09-28 (×5): qty 1

## 2015-09-28 MED ORDER — SODIUM CHLORIDE 0.9 % IV SOLN
INTRAVENOUS | Status: DC
  Administered 2015-09-28: 1000 mL via INTRAVENOUS
  Administered 2015-09-28 – 2015-10-01 (×5): via INTRAVENOUS

## 2015-09-28 MED ORDER — ACETAMINOPHEN 650 MG RE SUPP: 650.0000 mg | Freq: Four times a day (QID) | RECTAL | Status: DC | PRN

## 2015-09-28 MED ORDER — SODIUM CHLORIDE 0.9% FLUSH: 3.0000 mL | INTRAVENOUS | Status: DC | PRN

## 2015-09-28 MED ORDER — FLUOXETINE HCL 20 MG PO CAPS
20.0000 mg | ORAL_CAPSULE | Freq: Every day | ORAL | Status: DC
  Administered 2015-09-28 – 2015-10-03 (×6): 20 mg via ORAL
  Filled 2015-09-28 (×11): qty 1

## 2015-09-28 MED ORDER — SODIUM CHLORIDE 0.9 % IV SOLN: INTRAVENOUS | Status: AC

## 2015-09-28 MED ORDER — SENNA 8.6 MG PO TABS
1.0000 | ORAL_TABLET | Freq: Two times a day (BID) | ORAL | Status: DC
  Administered 2015-09-28 – 2015-10-01 (×6): 8.6 mg via ORAL
  Filled 2015-09-28 (×6): qty 1

## 2015-09-28 MED ORDER — PALBOCICLIB 100 MG PO CAPS: 100.0000 mg | ORAL_CAPSULE | Freq: Every day | ORAL | Status: DC

## 2015-09-28 MED ORDER — ACETAMINOPHEN 325 MG PO TABS
650.0000 mg | ORAL_TABLET | Freq: Four times a day (QID) | ORAL | Status: DC | PRN
Start: 2015-09-28 — End: 2015-10-03

## 2015-09-28 NOTE — ED Provider Notes (Signed)
CSN: [id number]     Arrival date & time 09/28/15  1025 History   First MD Initiated Contact with Patient 09/28/15 1234     Chief Complaint  Patient presents with  . Pain  . Weakness     (Consider location/radiation/quality/duration/timing/severity/associated sxs/prior Treatment) HPI   Shelly Hall is a 45 y.o. female who presents for evaluation of "pain from legs down." She is taking her prescribed medications, without relief. She was discharged from the hospital one week ago, during that visit, she was taken off OxyContin, and placed on increased analgesia treatment with hydromorphone, phone 8 mg every 3 hours and methadone 10 mg twice a day. She is using the methadone, at least 3 or 4 tablets each day. She has a follow-up appointment tomorrow with her oncologist. She has generalized bony metastases, from breast cancer. Her pain is worse in her "butt", when she is sitting. Because of the pain, she is unable to ambulate for the last 24 hours. She states that when she was taking OxyContin, her pain was better. She denies fever, chills, cough or chest pain. She has occasional vomiting. There is no diarrhea. There are no other known modifying factors.   Past Medical History  Diagnosis Date  . Hypertension   . Sarcoidosis ([place])     no problems per patient   . Anemia   . Complication of anesthesia     makes pt. restless and unable to be still  . High cholesterol   . Thyroid cancer ([place]) 2015  . Type II diabetes mellitus ([place])   . Breast cancer ([place]) 2015    left upper outer;  had chemo  . Radiation 06/23/14-08/11/14    Invasive ductal ca o left breast  . SVD (spontaneous vaginal delivery)     x 2  . Heart murmur     never had any problems as adult  . Bronchitis 2014  . Depression     no meds currently  . Anxiety     no meds currently  . GERD (gastroesophageal reflux disease)    Past Surgical History  Procedure Laterality Date  . Tubal ligation  2001  . Endobronchial  ultrasound Bilateral 12/02/2012    Procedure: ENDOBRONCHIAL ULTRASOUND;  Surgeon: Collene Gobble, MD;  [facility];  Service: Cardiopulmonary;  Laterality: Bilateral;  . Portacath placement N/A 10/22/2013    Procedure: INSERTION PORT-A-CATH;  Surgeon: [name], MD;  Location: [facility];  Service: General;  Laterality: N/A;  . Lung biopsy  2014  . Mastectomy w/ sentinel node biopsy Left 04/23/2014    & axillary LND  . Mastectomy w/ sentinel node biopsy Left 04/23/2014    Procedure: LEFT BREAST MASTECTOMY WITH SENTINEL LYMPH NODE BIOPSY AND AXILLARY LYMPH NODE DISECTION;  Surgeon: [name], MD;  Location: [facility];  Service: General;  Laterality: Left;  . Thyroid lobectomy Left 04/23/2014    Procedure: LEFT THYROID LOBECTOMY;  Surgeon: [name], MD;  Location: [facility];  Service: General;  Laterality: Left;  . Breast biopsy Left 2015 X 3    biopsy  . Wisdom tooth extraction    . Leep    . Laparoscopic assisted vaginal hysterectomy N/A 12/09/2014    Procedure: LAPAROSCOPIC ASSISTED VAGINAL HYSTERECTOMY;  Surgeon: Cheri Fowler, MD;  Location: [facility];  Service: Gynecology;  Laterality: N/A;  . Salpingoophorectomy Bilateral 12/09/2014    Procedure: SALPINGO OOPHORECTOMY;  Surgeon: Cheri Fowler, MD;  Location: [facility];  Service: Gynecology;  Laterality: Bilateral;  . Abdominal hysterectomy    .  Port-a-cath removal Left 02/03/2015    Procedure: REMOVAL PORT-A-CATH;  Surgeon: [name], MD;  Location: [facility];  Service: General;  Laterality: Left;   Family History  Problem Relation Age of Onset  . Cancer Paternal Aunt     "bone cancer"; deceased 59s  . Cancer Paternal Uncle     stomach cancer; deceased 43s  . Cancer Paternal Aunt     unk. primary; currently 53s  . Prostate cancer Maternal Grandfather 62  . Asthma Mother    Social History  Substance Use Topics  . Smoking status: Current Some Day Smoker -- 0.25 packs/day for 25 years    Types: Cigarettes  .  Smokeless tobacco: Never Used     Comment: 04/23/2014 "using vapor; down to 3-4 cigarettes/day now"  . Alcohol Use: 6.0 oz/week    4 Cans of beer, 4 Shots of liquor, 2 Standard drinks or equivalent per week   OB History    No data available     Review of Systems  All other systems reviewed and are negative.     Allergies  Nyquil multi-symptom  Home Medications   Prior to Admission medications   Medication Sig Start Date End Date Taking? Authorizing Provider  albuterol (PROVENTIL HFA;VENTOLIN HFA) 108 (90 BASE) MCG/ACT inhaler Inhale 2 puffs into the lungs every 6 (six) hours as needed for wheezing. Reported on 09/01/2015   Yes Historical Provider, MD  dexamethasone (DECADRON) 4 MG tablet Take 1 tablet (4 mg total) by mouth 3 (three) times daily. 09/21/15  Yes Kelvin Cellar, MD  ferrous sulfate 325 (65 FE) MG tablet Take 325 mg by mouth daily with breakfast.    Yes Historical Provider, MD  FLUoxetine (PROZAC) 20 MG tablet Take 20 mg by mouth every morning.   Yes Historical Provider, MD  gabapentin (NEURONTIN) 300 MG capsule Take 2 capsules (600 mg total) by mouth 2 (two) times daily. Patient taking differently: Take 300-600 mg by mouth 3 (three) times daily. Take 300mg  in the morning and afternoon and 600mg  in the evening 02/18/15  Yes Heather F Boelter, NP  HYDROmorphone (DILAUDID) 8 MG tablet Take 1 tablet (8 mg total) by mouth every 3 (three) hours as needed for severe pain. 09/21/15  Yes Kelvin Cellar, MD  letrozole [place]) 2.5 MG tablet Take 1 tablet (2.5 mg total) by mouth daily. 08/06/15  Yes Chauncey Cruel, MD  metFORMIN (GLUCOPHAGE) 1000 MG tablet Take 1,000 mg by mouth 2 (two) times daily with a meal.  04/13/14  Yes Historical Provider, MD  methadone (DOLOPHINE) 10 MG tablet Take 1 tablet (10 mg total) by mouth every 12 (twelve) hours. Patient taking differently: Take 10 mg by mouth every 8 (eight) hours.  09/21/15  Yes Kelvin Cellar, MD  morphine (MS CONTIN) 15 MG 12 hr  tablet Take 15 mg by mouth every 12 (twelve) hours.   Yes Historical Provider, MD  Multiple Vitamin (MULTIVITAMIN WITH MINERALS) TABS tablet Take 1 tablet by mouth daily.   Yes Historical Provider, MD  palbociclib (IBRANCE) 100 MG capsule Take 1 capsule (100 mg total) by mouth daily with breakfast. Take whole with food. 09/01/15  Yes Laurie Panda, NP  lactulose (CHRONULAC) 10 GM/15ML solution Take 30 mLs (20 g total) by mouth 2 (two) times daily. 09/21/15   Kelvin Cellar, MD   BP 116/81 mmHg  Pulse 65  Temp(Src) 98.2 F (36.8 C) (Oral)  Resp 12  SpO2 97%  LMP 12/07/2014 Physical Exam  Constitutional: She is  oriented to person, place, and time. She appears well-developed.  Appears older than stated age.  HENT:  Head: Normocephalic and atraumatic.  Right Ear: External ear normal.  Left Ear: External ear normal.  Eyes: Conjunctivae and EOM are normal. Pupils are equal, round, and reactive to light.  Neck: Normal range of motion and phonation normal. Neck supple.  Cardiovascular: Normal rate, regular rhythm and normal heart sounds.   Pulmonary/Chest: Effort normal and breath sounds normal. She exhibits no bony tenderness.  Abdominal: Soft. There is no tenderness.  Musculoskeletal: Normal range of motion.  Decreased range of motion. Legs bilaterally, left greater than right, secondary to pain. There is moderate pain with passive range of motion of each hip. There is no palpable deformity of either leg.  Neurological: She is alert and oriented to person, place, and time. No cranial nerve deficit or sensory deficit. She exhibits normal muscle tone. Coordination normal.  Skin: Skin is warm, dry and intact.  Psychiatric: Her behavior is normal. Judgment and thought content normal.  She appears depressed  Nursing note and vitals reviewed.   ED Course  Procedures (including critical care time)  Initial clinical impression- pain secondary to bony metastases. Patient is unable to ambulate  secondary to the pain, therefore will likely need to be admitted for adjustment of her narcotic analgesia.  Medications  0.9 %  sodium chloride infusion ( Intravenous Transfusing/Transfer 09/28/15 1524)  HYDROmorphone (DILAUDID) injection 2 mg (2 mg Intravenous Given 09/28/15 1306)    Patient Vitals for the past 24 hrs:  BP Temp Temp src Pulse Resp SpO2  09/28/15 1516 116/81 mmHg - - 65 12 97 %  09/28/15 1500 - - - 64 14 97 %  09/28/15 1348 100/69 mmHg 98.2 F (36.8 C) Oral 60 16 94 %  09/28/15 1044 - - - - - 98 %  09/28/15 1040 113/72 mmHg 98.2 F (36.8 C) Oral 76 18 97 %    2:29 PM Reevaluation with update and discussion. After initial assessment and treatment, an updated evaluation reveals she remains uncomfortable and has difficulty moving. Patient and family updated. Jupiter Boys L    2:30 PM-Consult complete with Mikhail. Patient case explained and discussed. She agrees to admit patient for further evaluation and treatment. Call ended at [facility] - Abnormal; Notable for the following:    BUN 23 (*)    Creatinine, Ser 1.18 (*)    Calcium 8.8 (*)    GFR calc non Af Amer 54 (*)    All other components within normal limits  CBC WITH DIFFERENTIAL/PLATELET - Abnormal; Notable for the following:    WBC 3.4 (*)    RBC 3.31 (*)    Hemoglobin 9.7 (*)    HCT 29.4 (*)    RDW 17.5 (*)    Lymphs Abs 0.6 (*)    All other components within normal limits    Imaging Review No results found. I have personally reviewed and evaluated these images and lab results as part of my medical decision-making.   EKG Interpretation   Date/Time:  Tuesday September 28 2015 13:33:06 EDT Ventricular Rate:  62 PR Interval:  139 QRS Duration: 98 QT Interval:  446 QTC Calculation: 453 R Axis:   24 Text Interpretation:  Sinus rhythm Probable left ventricular hypertrophy  Baseline wander in lead(s) V4 since last tracing no significant change  Confirmed  by Eulis Foster  MD, Delbert Vu [id number]) on 09/28/2015 1:52:15 PM  MDM   Final diagnoses:  Metastatic cancer ([place])  Intractable pain    Intractable pain, likely secondary to metastatic cancer. Recently seen and treated by palliative care, however, the treatment program is not affording her enough pain relief. She will require admission for management at this time.  Nursing Notes Reviewed/ Care Coordinated Applicable Imaging Reviewed Interpretation of Laboratory Data incorporated into ED treatment   Plan: Admit  Daleen Bo, MD 09/28/15 [phone]

## 2015-09-28 NOTE — H&P (Signed)
[facility] History and Physical  Shelly Hall [name] DOB: Dec 24, 1970 DOA: 09/28/2015  Referring physician: Dr. Daleen Bo, EPD PCP: Elyn Peers, MD  Specialists: Dr. Jana Hakim, oncology Patient coming from: home  Chief Complaint: Leg pain and inability to walk.  HPI: Shelly Hall is a 45 y.o. female with a medical history of stage IV breast cancer, bony metastasis, hypertension, diabetes, that presented to the emergency department with complaints of pain and inability to walk. Patient states that this started just or evening. She was recently admitted and discharged 09/21/2015. At that time, she was seen by palliative care, and discharged on methadone, Dilaudid. She didn't feel that her pain was adequate controlled. She felt that OxyContin helped her pain more. At this time, patient does appear to be somewhat somnolent but is easily arousable. She feels that her pain is so bad that she cannot walk and she feels weakness in her legs. She was supposed to see her oncologist on 09/29/2015. At this time she denies any shortness of breath, chest pain, abdominal pain, nausea or vomiting, changes in bowel pattern, problems with urination.   ED Course: Patient was given pain medication, Dilaudid, with no improvement of her pain.  Review of Systems:  As per HPI otherwise 10 point review of systems negative.   Past Medical History  Diagnosis Date  . Hypertension   . Sarcoidosis ([place])     no problems per patient   . Anemia   . Complication of anesthesia     makes pt. restless and unable to be still  . High cholesterol   . Thyroid cancer ([place]) 2015  . Type II diabetes mellitus ([place])   . Breast cancer ([place]) 2015    left upper outer;  had chemo  . Radiation 06/23/14-08/11/14    Invasive ductal ca o left breast  . SVD (spontaneous vaginal delivery)     x 2  . Heart murmur     never had any problems as adult  . Bronchitis 2014  . Depression     no meds currently  .  Anxiety     no meds currently  . GERD (gastroesophageal reflux disease)     Past Surgical History  Procedure Laterality Date  . Tubal ligation  2001  . Endobronchial ultrasound Bilateral 12/02/2012    Procedure: ENDOBRONCHIAL ULTRASOUND;  Surgeon: Collene Gobble, MD;  [facility];  Service: Cardiopulmonary;  Laterality: Bilateral;  . Portacath placement N/A 10/22/2013    Procedure: INSERTION PORT-A-CATH;  Surgeon: [name], MD;  Location: [facility];  Service: General;  Laterality: N/A;  . Lung biopsy  2014  . Mastectomy w/ sentinel node biopsy Left 04/23/2014    & axillary LND  . Mastectomy w/ sentinel node biopsy Left 04/23/2014    Procedure: LEFT BREAST MASTECTOMY WITH SENTINEL LYMPH NODE BIOPSY AND AXILLARY LYMPH NODE DISECTION;  Surgeon: [name], MD;  Location: [facility];  Service: General;  Laterality: Left;  . Thyroid lobectomy Left 04/23/2014    Procedure: LEFT THYROID LOBECTOMY;  Surgeon: [name], MD;  Location: [facility];  Service: General;  Laterality: Left;  . Breast biopsy Left 2015 X 3    biopsy  . Wisdom tooth extraction    . Leep    . Laparoscopic assisted vaginal hysterectomy N/A 12/09/2014    Procedure: LAPAROSCOPIC ASSISTED VAGINAL HYSTERECTOMY;  Surgeon: Cheri Fowler, MD;  Location: [facility];  Service: Gynecology;  Laterality: N/A;  . Salpingoophorectomy Bilateral 12/09/2014    Procedure: SALPINGO OOPHORECTOMY;  Surgeon: Cheri Fowler, MD;  Location: [facility];  Service: Gynecology;  Laterality: Bilateral;  . Abdominal hysterectomy    . Port-a-cath removal Left 02/03/2015    Procedure: REMOVAL PORT-A-CATH;  Surgeon: [name], MD;  Location: [facility];  Service: General;  Laterality: Left;    Social History:  reports that she has been smoking Cigarettes.  She has a 6.25 pack-year smoking history. She has never used smokeless tobacco. She reports that she drinks about 6.0 oz of alcohol per week. She reports that she does not use illicit  drugs.   Allergies  Allergen Reactions  . Nyquil Multi-Symptom [Pseudoeph-Doxylamine-Dm-Apap] Itching and Other (See Comments)    Skin peels on hands, and feet    Family History  Problem Relation Age of Onset  . Cancer Paternal Aunt     "bone cancer"; deceased 46s  . Cancer Paternal Uncle     stomach cancer; deceased 86s  . Cancer Paternal Aunt     unk. primary; currently 71s  . Prostate cancer Maternal Grandfather 26  . Asthma Mother     Prior to Admission medications   Medication Sig Start Date End Date Taking? Authorizing Provider  albuterol (PROVENTIL HFA;VENTOLIN HFA) 108 (90 BASE) MCG/ACT inhaler Inhale 2 puffs into the lungs every 6 (six) hours as needed for wheezing. Reported on 09/01/2015   Yes Historical Provider, MD  dexamethasone (DECADRON) 4 MG tablet Take 1 tablet (4 mg total) by mouth 3 (three) times daily. 09/21/15  Yes Kelvin Cellar, MD  ferrous sulfate 325 (65 FE) MG tablet Take 325 mg by mouth daily with breakfast.    Yes Historical Provider, MD  FLUoxetine (PROZAC) 20 MG tablet Take 20 mg by mouth every morning.   Yes Historical Provider, MD  gabapentin (NEURONTIN) 300 MG capsule Take 2 capsules (600 mg total) by mouth 2 (two) times daily. Patient taking differently: Take 300-600 mg by mouth 3 (three) times daily. Take 300mg  in the morning and afternoon and 600mg  in the evening 02/18/15  Yes Heather F Boelter, NP  HYDROmorphone (DILAUDID) 8 MG tablet Take 1 tablet (8 mg total) by mouth every 3 (three) hours as needed for severe pain. 09/21/15  Yes Kelvin Cellar, MD  letrozole [facility]) 2.5 MG tablet Take 1 tablet (2.5 mg total) by mouth daily. 08/06/15  Yes Chauncey Cruel, MD  metFORMIN (GLUCOPHAGE) 1000 MG tablet Take 1,000 mg by mouth 2 (two) times daily with a meal.  04/13/14  Yes Historical Provider, MD  methadone (DOLOPHINE) 10 MG tablet Take 1 tablet (10 mg total) by mouth every 12 (twelve) hours. Patient taking differently: Take 10 mg by mouth every 8  (eight) hours.  09/21/15  Yes Kelvin Cellar, MD  morphine (MS CONTIN) 15 MG 12 hr tablet Take 15 mg by mouth every 12 (twelve) hours.   Yes Historical Provider, MD  Multiple Vitamin (MULTIVITAMIN WITH MINERALS) TABS tablet Take 1 tablet by mouth daily.   Yes Historical Provider, MD  palbociclib (IBRANCE) 100 MG capsule Take 1 capsule (100 mg total) by mouth daily with breakfast. Take whole with food. 09/01/15  Yes Laurie Panda, NP  lactulose (CHRONULAC) 10 GM/15ML solution Take 30 mLs (20 g total) by mouth 2 (two) times daily. 09/21/15   Kelvin Cellar, MD    Physical Exam: Filed Vitals:   09/28/15 1500 09/28/15 1516  BP:  116/81  Pulse: 64 65  Temp:    Resp: 14 12     General: Well developed, well nourished, NAD,  appears stated age, somnolent but arousable  HEENT: NCAT, PERRLA, EOMI, Anicteic Sclera, mucous membranes moist.   Neck: Supple, no JVD, no masses  Cardiovascular: S1 S2 auscultated, no rubs, murmurs or gallops. Regular rate and rhythm.  Respiratory: Clear to auscultation bilaterally with equal chest rise  Abdomen: Soft, nontender, nondistended, + bowel sounds  Extremities: warm dry without cyanosis clubbing or edema  Neuro: AAOx3, cranial nerves grossly intact. 4/5 strength in lower extremities. 5 out of 5 strength of the upper extremities. Pain with passive ROM of LE.   Skin: Without rashes exudates or nodules  Psych: Normal affect and demeanor   Labs on Admission: I have personally reviewed following labs and imaging studies CBC:  Recent Labs Lab 09/28/15 1318  WBC 3.4*  NEUTROABS 2.5  HGB 9.7*  HCT 29.4*  MCV 88.8  PLT [id number]   Basic Metabolic Panel:  Recent Labs Lab 09/28/15 1318  NA 141  K 4.2  CL 103  CO2 29  GLUCOSE 94  BUN 23*  CREATININE 1.18*  CALCIUM 8.8*   GFR: CrCl cannot be calculated (Unknown ideal weight.). Liver Function Tests: No results for input(s): AST, ALT, ALKPHOS, BILITOT, PROT, ALBUMIN in the last 168 hours. No  results for input(s): LIPASE, AMYLASE in the last 168 hours. No results for input(s): AMMONIA in the last 168 hours. Coagulation Profile: No results for input(s): INR, PROTIME in the last 168 hours. Cardiac Enzymes: No results for input(s): CKTOTAL, CKMB, CKMBINDEX, TROPONINI in the last 168 hours. BNP (last 3 results) No results for input(s): PROBNP in the last 8760 hours. HbA1C: No results for input(s): HGBA1C in the last 72 hours. CBG: No results for input(s): GLUCAP in the last 168 hours. Lipid Profile: No results for input(s): CHOL, HDL, LDLCALC, TRIG, CHOLHDL, LDLDIRECT in the last 72 hours. Thyroid Function Tests: No results for input(s): TSH, T4TOTAL, FREET4, T3FREE, THYROIDAB in the last 72 hours. Anemia Panel: No results for input(s): VITAMINB12, FOLATE, FERRITIN, TIBC, IRON, RETICCTPCT in the last 72 hours. Urine analysis:    Component Value Date/Time   COLORURINE YELLOW 01/04/2012 0246   APPEARANCEUR CLOUDY* 01/04/2012 0246   LABSPEC 1.030 01/04/2012 0246   PHURINE 6.5 01/04/2012 0246   GLUCOSEU NEGATIVE 01/04/2012 0246   HGBUR LARGE* 01/04/2012 0246   BILIRUBINUR NEGATIVE 01/04/2012 0246   KETONESUR NEGATIVE 01/04/2012 0246   PROTEINUR NEGATIVE 01/04/2012 0246   UROBILINOGEN 0.2 01/04/2012 0246   NITRITE NEGATIVE 01/04/2012 0246   LEUKOCYTESUR NEGATIVE 01/04/2012 0246   Sepsis Labs: @LABRCNTIP (procalcitonin:4,lacticidven:4) )No results found for this or any previous visit (from the past 240 hour(s)).   Radiological Exams on Admission: No results found.  EKG: Independently reviewed. Sinus rhythm, rate 62  Assessment/Plan  Pain secondary to bone metastasis -Patient was recently admitted proximal one week ago. She was seen by palliative care at that time. She required a PCA Dilaudid pump at that time, and was using methadone -Patient was transitioned to methadone, Dilaudid, MS contin. Her OxyContin was discontinued. -Patient states that her pain has been  worse and she's been unable to walk today. She was follow-up with her oncologist, Dr. Jana Hakim on 09/29/2015. -Consulted palliative care  Chronic Anemia  -Hemoglobin appears to be stable, continue to monitor CBC  History of stage IV breast cancer -Was receiving palliative radiation therapy along with chemotherapy -Dr. Jana Hakim added to treatment team -Continue Femara  Diabetes mellitus, type II -Continue metformin  Depression -Continue Prozac  Neuropathy -Continue gabapentin   DVT prophylaxis: Lovenox  Code Status: Full  Family Communication: Mother at bedside. Admission, patients condition and plan of care including tests being ordered have been discussed with the patient and Mother who indicate understanding and agree with the plan and Code Status.  Disposition Plan: Admitted to medical floor   Consults called: palliative care   Admission status: Observation   Time spent: 60 minutes  Jameshia Hayashida D.O. [facility] Pager [phone]  If 7PM-7AM, please contact night-coverage [email or web address] Password [facility] 09/28/2015, 5:19 PM

## 2015-09-28 NOTE — ED Notes (Signed)
Per EMS- Patient c/o pain all over, especially when she moves. Patient has a history of stage 4 bone cancer and reports that she is currently taking Dilaudid and Morphine po with no relief. Patient states unbearable pain since yesterday at 1800.

## 2015-09-28 NOTE — ED Notes (Signed)
Bed: [id number] Expected date:  Expected time:  Means of arrival:  Comments: EMS, bone cancer 45yo

## 2015-09-29 ENCOUNTER — Other Ambulatory Visit: Payer: Medicaid Other

## 2015-09-29 ENCOUNTER — Inpatient Hospital Stay (HOSPITAL_COMMUNITY): Payer: Medicaid Other

## 2015-09-29 DIAGNOSIS — F419 Anxiety disorder, unspecified: Secondary | ICD-10-CM | POA: Diagnosis present

## 2015-09-29 DIAGNOSIS — G893 Neoplasm related pain (acute) (chronic): Secondary | ICD-10-CM | POA: Diagnosis present

## 2015-09-29 DIAGNOSIS — Z7984 Long term (current) use of oral hypoglycemic drugs: Secondary | ICD-10-CM | POA: Diagnosis not present

## 2015-09-29 DIAGNOSIS — D638 Anemia in other chronic diseases classified elsewhere: Secondary | ICD-10-CM | POA: Diagnosis present

## 2015-09-29 DIAGNOSIS — K219 Gastro-esophageal reflux disease without esophagitis: Secondary | ICD-10-CM | POA: Diagnosis present

## 2015-09-29 DIAGNOSIS — E7800 Pure hypercholesterolemia, unspecified: Secondary | ICD-10-CM | POA: Diagnosis present

## 2015-09-29 DIAGNOSIS — Z9012 Acquired absence of left breast and nipple: Secondary | ICD-10-CM | POA: Diagnosis not present

## 2015-09-29 DIAGNOSIS — Z808 Family history of malignant neoplasm of other organs or systems: Secondary | ICD-10-CM | POA: Diagnosis not present

## 2015-09-29 DIAGNOSIS — M79606 Pain in leg, unspecified: Secondary | ICD-10-CM | POA: Diagnosis present

## 2015-09-29 DIAGNOSIS — Z79899 Other long term (current) drug therapy: Secondary | ICD-10-CM | POA: Diagnosis not present

## 2015-09-29 DIAGNOSIS — F17210 Nicotine dependence, cigarettes, uncomplicated: Secondary | ICD-10-CM | POA: Diagnosis present

## 2015-09-29 DIAGNOSIS — Z79891 Long term (current) use of opiate analgesic: Secondary | ICD-10-CM | POA: Diagnosis not present

## 2015-09-29 DIAGNOSIS — G622 Polyneuropathy due to other toxic agents: Secondary | ICD-10-CM | POA: Diagnosis present

## 2015-09-29 DIAGNOSIS — Z79811 Long term (current) use of aromatase inhibitors: Secondary | ICD-10-CM | POA: Diagnosis not present

## 2015-09-29 DIAGNOSIS — Z170 Estrogen receptor positive status [ER+]: Secondary | ICD-10-CM | POA: Diagnosis not present

## 2015-09-29 DIAGNOSIS — T451X5A Adverse effect of antineoplastic and immunosuppressive drugs, initial encounter: Secondary | ICD-10-CM | POA: Diagnosis present

## 2015-09-29 DIAGNOSIS — C7951 Secondary malignant neoplasm of bone: Secondary | ICD-10-CM | POA: Diagnosis present

## 2015-09-29 DIAGNOSIS — D869 Sarcoidosis, unspecified: Secondary | ICD-10-CM | POA: Diagnosis present

## 2015-09-29 DIAGNOSIS — Z7952 Long term (current) use of systemic steroids: Secondary | ICD-10-CM | POA: Diagnosis not present

## 2015-09-29 DIAGNOSIS — Z800 Family history of malignant neoplasm of digestive organs: Secondary | ICD-10-CM | POA: Diagnosis not present

## 2015-09-29 DIAGNOSIS — Z515 Encounter for palliative care: Secondary | ICD-10-CM | POA: Diagnosis not present

## 2015-09-29 DIAGNOSIS — R52 Pain, unspecified: Secondary | ICD-10-CM | POA: Diagnosis not present

## 2015-09-29 DIAGNOSIS — Z825 Family history of asthma and other chronic lower respiratory diseases: Secondary | ICD-10-CM | POA: Diagnosis not present

## 2015-09-29 DIAGNOSIS — C801 Malignant (primary) neoplasm, unspecified: Secondary | ICD-10-CM | POA: Diagnosis not present

## 2015-09-29 DIAGNOSIS — Z888 Allergy status to other drugs, medicaments and biological substances status: Secondary | ICD-10-CM | POA: Diagnosis not present

## 2015-09-29 DIAGNOSIS — C50412 Malignant neoplasm of upper-outer quadrant of left female breast: Secondary | ICD-10-CM | POA: Diagnosis present

## 2015-09-29 DIAGNOSIS — I10 Essential (primary) hypertension: Secondary | ICD-10-CM | POA: Diagnosis present

## 2015-09-29 DIAGNOSIS — E119 Type 2 diabetes mellitus without complications: Secondary | ICD-10-CM | POA: Diagnosis present

## 2015-09-29 DIAGNOSIS — Z90710 Acquired absence of both cervix and uterus: Secondary | ICD-10-CM | POA: Diagnosis not present

## 2015-09-29 DIAGNOSIS — K5900 Constipation, unspecified: Secondary | ICD-10-CM | POA: Diagnosis not present

## 2015-09-29 DIAGNOSIS — Z923 Personal history of irradiation: Secondary | ICD-10-CM | POA: Diagnosis not present

## 2015-09-29 DIAGNOSIS — Z85850 Personal history of malignant neoplasm of thyroid: Secondary | ICD-10-CM | POA: Diagnosis not present

## 2015-09-29 DIAGNOSIS — F3189 Other bipolar disorder: Secondary | ICD-10-CM | POA: Diagnosis present

## 2015-09-29 LAB — BASIC METABOLIC PANEL
Anion gap: 7 (ref 5–15)
BUN: 17 mg/dL (ref 6–20)
CHLORIDE: 104 mmol/L (ref 101–111)
CO2: 26 mmol/L (ref 22–32)
CREATININE: 1.1 mg/dL — AB (ref 0.44–1.00)
Calcium: 8.6 mg/dL — ABNORMAL LOW (ref 8.9–10.3)
GFR calc Af Amer: >60 mL/min (ref >60)
GFR calc non Af Amer: 59 mL/min — ABNORMAL LOW (ref >60)
Glucose, Bld: 172 mg/dL — ABNORMAL HIGH (ref 65–99)
Potassium: 4.9 mmol/L (ref 3.5–5.1)
Sodium: 137 mmol/L (ref 135–145)

## 2015-09-29 LAB — CBC
HCT: 30.1 % — ABNORMAL LOW (ref 36.0–46.0)
Hemoglobin: 9.8 g/dL — ABNORMAL LOW (ref 12.0–15.0)
MCH: 29.5 pg (ref 26.0–34.0)
MCHC: 32.6 g/dL (ref 30.0–36.0)
MCV: 90.7 fL (ref 78.0–100.0)
PLATELETS: 291 10*3/uL (ref 150–400)
RBC: 3.32 MIL/uL — ABNORMAL LOW (ref 3.87–5.11)
RDW: 17.8 % — AB (ref 11.5–15.5)
WBC: 2.9 10*3/uL — ABNORMAL LOW (ref 4.0–10.5)

## 2015-09-29 LAB — GLUCOSE, CAPILLARY
Glucose-Capillary: 121 mg/dL — ABNORMAL HIGH (ref 65–99)
Glucose-Capillary: 147 mg/dL — ABNORMAL HIGH (ref 65–99)
Glucose-Capillary: 216 mg/dL — ABNORMAL HIGH (ref 65–99)

## 2015-09-29 MED ORDER — ONDANSETRON HCL 4 MG/2ML IJ SOLN: 4.0000 mg | Freq: Four times a day (QID) | INTRAMUSCULAR | Status: DC | PRN

## 2015-09-29 MED ORDER — HYDROMORPHONE 1 MG/ML IV SOLN
INTRAVENOUS | Status: DC
  Administered 2015-09-29: 2.7 mg via INTRAVENOUS
  Administered 2015-09-29: 09:00:00 via INTRAVENOUS
  Administered 2015-09-29: 2.4 mg via INTRAVENOUS
  Administered 2015-09-29: 3.6 mg via INTRAVENOUS
  Administered 2015-09-30: 3.9 mg via INTRAVENOUS
  Administered 2015-09-30: 1.5 mg via INTRAVENOUS
  Administered 2015-09-30: 1.2 mg via INTRAVENOUS
  Administered 2015-09-30: 2.4 mg via INTRAVENOUS
  Administered 2015-09-30: 1.2 mg via INTRAVENOUS
  Administered 2015-09-30 (×2): 1.8 mg via INTRAVENOUS
  Administered 2015-10-01: 2.1 mg via INTRAVENOUS
  Administered 2015-10-01: 0.6 mg via INTRAVENOUS
  Administered 2015-10-01: 1.5 mg via INTRAVENOUS
  Administered 2015-10-01: 0.6 mg via INTRAVENOUS
  Administered 2015-10-01: 4.2 mg via INTRAVENOUS
  Administered 2015-10-02: 1.5 mg via INTRAVENOUS
  Administered 2015-10-02: 2.04 mg via INTRAVENOUS
  Administered 2015-10-02: 2.4 mg via INTRAVENOUS
  Administered 2015-10-02: 0 mg via INTRAVENOUS
  Filled 2015-09-29 (×2): qty 25

## 2015-09-29 MED ORDER — DEXAMETHASONE 4 MG PO TABS
4.0000 mg | ORAL_TABLET | Freq: Two times a day (BID) | ORAL | Status: DC
  Administered 2015-09-30 – 2015-10-03 (×7): 4 mg via ORAL
  Filled 2015-09-29 (×7): qty 1

## 2015-09-29 MED ORDER — PALBOCICLIB 100 MG PO CAPS
100.0000 mg | ORAL_CAPSULE | Freq: Every day | ORAL | Status: DC
  Administered 2015-09-29 – 2015-10-03 (×5): 100 mg via ORAL

## 2015-09-29 MED ORDER — METHADONE HCL 10 MG PO TABS
10.0000 mg | ORAL_TABLET | Freq: Three times a day (TID) | ORAL | Status: DC
  Administered 2015-09-29 – 2015-09-30 (×4): 10 mg via ORAL
  Filled 2015-09-29 (×5): qty 1

## 2015-09-29 MED ORDER — NALOXONE HCL 0.4 MG/ML IJ SOLN: 0.4000 mg | INTRAMUSCULAR | Status: DC | PRN

## 2015-09-29 MED ORDER — SODIUM CHLORIDE 0.9% FLUSH: 9.0000 mL | INTRAVENOUS | Status: DC | PRN

## 2015-09-29 MED ORDER — DIPHENHYDRAMINE HCL 12.5 MG/5ML PO ELIX: 12.5000 mg | ORAL_SOLUTION | Freq: Four times a day (QID) | ORAL | Status: DC | PRN

## 2015-09-29 MED ORDER — ENSURE ENLIVE PO LIQD
237.0000 mL | Freq: Two times a day (BID) | ORAL | Status: DC
  Administered 2015-09-30 – 2015-10-03 (×6): 237 mL via ORAL

## 2015-09-29 MED ORDER — DIPHENHYDRAMINE HCL 50 MG/ML IJ SOLN: 12.5000 mg | Freq: Four times a day (QID) | INTRAMUSCULAR | Status: DC | PRN

## 2015-09-29 NOTE — Progress Notes (Signed)
Notified [facility] of admission.

## 2015-09-29 NOTE — Progress Notes (Addendum)
Initial Nutrition Assessment  DOCUMENTATION CODES:   Not applicable  INTERVENTION:  -Ensure Enlive po BID, each supplement provides 350 kcal and 20 grams of protein -RD Continue to monitor -Encourage PO intake   NUTRITION DIAGNOSIS:   Increased nutrient needs related to cancer and cancer related treatments as evidenced by estimated needs.  GOAL:   Patient will meet greater than or equal to 90% of their needs  MONITOR:   PO intake, Supplement acceptance, Labs, Weight trends, I & O's  REASON FOR ASSESSMENT:   Malnutrition Screening Tool    ASSESSMENT:   Shelly Hall is a 45 y.o. female with a medical history of stage IV breast cancer, bony metastasis, hypertension, diabetes, that presented to the emergency department with complaints of pain and inability to walk. Patient states that this started just or evening. She was recently admitted and discharged 09/21/2015.  Shelly Hall is a pleasant lady who unfortunately recently discharged from [facility] On 4/18 but presented back r/t poor pain control. She states her appetite has continued to be good -> For dinner last night she had chicken and a salad. This morning she had cereal and toast she ate most of, and for lunch she consumed 100% of her chicken caesar salad w/ Mac & Cheese. Denies nausea/vomiting Denies chewing/swallowing problems. Does endorse taste changes but denies metallic or bland tastes. "Foods are not tasting the same as they used to." Weight is stable  Nutrition-Focused physical exam completed. Findings are no fat depletion, no muscle depletion, and no edema.   Labs and Medications reviewed.  Diet Order:  Diet Carb Modified Fluid consistency:: Thin; Room service appropriate?: Yes  Skin:  Reviewed, no issues  Last BM:  4/25  Height:   Ht Readings from Last 1 Encounters:  09/28/15 5\' 3"  (1.6 m)    Weight:   Wt Readings from Last 1 Encounters:  09/28/15 185 lb (83.915 kg)    Ideal Body Weight:  52.3  kg  BMI:  Body mass index is 32.78 kg/(m^2).  Estimated Nutritional Needs:   Kcal:  1800-2100 (30-35 cal/kg ABW)  Protein:  100-125 grams (1.2-1.5g/kg)  Fluid:  >/= 1.8L  EDUCATION NEEDS:   No education needs identified at this time  Satira Anis. Laymon Stockert, MS, RD LDN After Hours/Weekend Pager [phone]

## 2015-09-29 NOTE — Progress Notes (Signed)
PROGRESS NOTE    Shelly Hall  [name] DOB: March 20, 1971 DOA: 09/28/2015 PCP: Elyn Peers, MD  Outpatient Specialists:  Dr Magrinat-oncology   Brief Narrative:   75 ? 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 clavicular biopsy L side= ER +, Prog -,  amplification On Letrozole palbaciclib    Assessment & Plan:   Principal Problem:  Pain from bone metastases (HCC)  Bone pain due to G-CSF  Metastatic cancer ([place]): stg IV breast Ca   Pain due to malignant neoplasm metastatic to bone [facility])  Greatly appreciate expertise of palliative medicine and management of pain-home dosages of medication include MS Contin 15 every 12, methadone 10 every 8, Dilaudid 8 mg 3 times a day severe pain  Expect once pain needs are determined by PCA usage, can then adjust short-acting pain medications.  Hesitate to escalate dosage of MS Contin given tolerance and therapeutic duplication with methadone  QTC on EKG was noted to be 436-would recheck EKG prior to discharge to ensure no risks for QTC prolongation on methadone A of the  Expect discussion Re: further goals of therapy as an outpatient with the patient and will cc oncologist Dr. Jana Hakim  patient tells me that the majority of her pain is in her hips bilaterally-plain film x-ray does not have any consideration for fracture at this time   May need to taper from Decadron 4 mg 3 times a day to a lower dose in the near future  Continue gabapentin 600 twice a day for neuropathic component to pain  Add K pad, low dose Toradol for breakthrough and nonsteroidal component for pain man okay to continue metformin 1000 twice a dayagement   continue letrozole, Palbaciclib  type II, non insulin dependent (HCC) Blood sugars ranging 94-216 Continue a.m. checks   reported history of thyroid cancer? Outpatient screening  Sarcoid quiesecent  Multi-modal anemia-Chronic  disease/of malignancy WBC 2.9, hemoglobin 9.8 and stable RDW increased Outpatient further management and saturation levels as per oncologist  Bipolar continue Prozac 20 q AM     VT prophylaxis: Lovenox Code Status: Full Family Communication: none + Disposition Plan: when pain adequately controlled-still on PCA   Consultants:   Pallaitive for pain management  Procedures:   none  Antimicrobials:   none    Subjective:  Patient apparently in severe pain earlier this afternoon Now hardly able to stay awake after being given bolus of PCA Dilaudid  Objective: Filed Vitals:   09/28/15 1629 09/28/15 2145 09/29/15 0612 09/29/15 1054  BP: 131/72 134/78 125/76 116/58  Pulse: 61 64 65 69  Temp: 98 F (36.7 C) 98.8 F (37.1 C) 98.4 F (36.9 C) 97.9 F (36.6 C)  TempSrc: Oral Oral Oral Oral  Resp: 16 16 16 16   Height: 5\' 3"  (1.6 m)     Weight: 83.915 kg (185 lb)     SpO2: 100% 100% 99% 98%    Intake/Output Summary (Last 24 hours) at 09/29/15 1306 Last data filed at 09/29/15 0400  Gross per 24 hour  Intake      0 ml  Output    700 ml  Net   -700 ml   Filed Weights   09/28/15 1629  Weight: 83.915 kg (185 lb)    Examination:  General exam: Appears calm and comfortable  Respiratory system: Clear to auscultation. Respiratory effort normal. Cardiovascular system: S1 & S2 heard, RRR. No JVD, murmurs, rubs, gallops or  clicks. No pedal edema. Gastrointestinal system: Abdomen is nondistended, soft and nontender. No organomegaly or masses felt. Normal bowel sounds heard. Central nervous system: Alert and oriented. No focal neurological deficits. Extremities: Symmetric 5 x 5 power. Skin: No rashes, lesions or ulcers Psychiatry: Judgement and insight appear normal. Mood & affect appropriate.     Data Reviewed: I have personally reviewed following labs and imaging studies  CBC:  Recent Labs Lab 09/28/15 1318 09/29/15 0344  WBC 3.4* 2.9*  NEUTROABS 2.5  --   HGB  9.7* 9.8*  HCT 29.4* 30.1*  MCV 88.8 90.7  PLT 284 [id number]   Basic Metabolic Panel:  Recent Labs Lab 09/28/15 1318 09/29/15 0344  NA 141 137  K 4.2 4.9  CL 103 104  CO2 29 26  GLUCOSE 94 172*  BUN 23* 17  CREATININE 1.18* 1.10*  CALCIUM 8.8* 8.6*   GFR: Estimated Creatinine Clearance: 65.6 mL/min (by C-G formula based on Cr of 1.1). Liver Function Tests: No results for input(s): AST, ALT, ALKPHOS, BILITOT, PROT, ALBUMIN in the last 168 hours. No results for input(s): LIPASE, AMYLASE in the last 168 hours. No results for input(s): AMMONIA in the last 168 hours. Coagulation Profile: No results for input(s): INR, PROTIME in the last 168 hours. Cardiac Enzymes: No results for input(s): CKTOTAL, CKMB, CKMBINDEX, TROPONINI in the last 168 hours. BNP (last 3 results) No results for input(s): PROBNP in the last 8760 hours. HbA1C: No results for input(s): HGBA1C in the last 72 hours. CBG:  Recent Labs Lab 09/28/15 2128 09/29/15 0750 09/29/15 1250 09/29/15 1731  GLUCAP 170* 121* 147* 216*   Lipid Profile: No results for input(s): CHOL, HDL, LDLCALC, TRIG, CHOLHDL, LDLDIRECT in the last 72 hours. Thyroid Function Tests: No results for input(s): TSH, T4TOTAL, FREET4, T3FREE, THYROIDAB in the last 72 hours. Anemia Panel: No results for input(s): VITAMINB12, FOLATE, FERRITIN, TIBC, IRON, RETICCTPCT in the last 72 hours. Urine analysis:    Component Value Date/Time   COLORURINE YELLOW 01/04/2012 0246   APPEARANCEUR CLOUDY* 01/04/2012 0246   LABSPEC 1.030 01/04/2012 0246   PHURINE 6.5 01/04/2012 0246   GLUCOSEU NEGATIVE 01/04/2012 0246   HGBUR LARGE* 01/04/2012 0246   BILIRUBINUR NEGATIVE 01/04/2012 0246   KETONESUR NEGATIVE 01/04/2012 0246   PROTEINUR NEGATIVE 01/04/2012 0246   UROBILINOGEN 0.2 01/04/2012 0246   NITRITE NEGATIVE 01/04/2012 0246   LEUKOCYTESUR NEGATIVE 01/04/2012 0246   Sepsis Labs:      Radiology Studies: No results found.      Scheduled  Meds: . dexamethasone  4 mg Oral TID  . docusate sodium  100 mg Oral BID  . enoxaparin (LOVENOX) injection  40 mg Subcutaneous Q24H  . ferrous sulfate  325 mg Oral Q breakfast  . FLUoxetine  20 mg Oral Daily  . gabapentin  300 mg Oral BID WC  . gabapentin  600 mg Oral QHS  . HYDROmorphone   Intravenous Q4H  . lactulose  20 g Oral BID  . letrozole  2.5 mg Oral Daily  . metFORMIN  1,000 mg Oral BID WC  . methadone  10 mg Oral Q8H  . multivitamin with minerals  1 tablet Oral Daily  . palbociclib  100 mg Oral Q breakfast  . senna  1 tablet Oral BID  . sodium chloride flush  3 mL Intravenous Q12H   Continuous Infusions: . sodium chloride 75 mL/hr at 09/29/15 0949     LOS: 0 days    Time spent: [address],  MD [facility] (P) [phone]   If 7PM-7AM, please contact night-coverage [email or web address] Password [email or web address] 09/29/2015, 1:06 PM

## 2015-09-29 NOTE — Consult Note (Signed)
Consultation Note Date: 09/29/2015   Patient Name: Shelly Hall  DOB: 1970-10-02  MRN: [id number]  Age / Sex: 45 y.o., female  PCP: Shelly Lei, MD Referring Physician: Nita Sells, MD  Reason for Consultation: Pain control  HPI/Patient Profile: 45 y.o. female  with past medical history of Stage IV breast cancer with bony metastasis admitted on 09/28/2015 with uncontrolled pain.   Clinical Assessment and Goals of Care: [name] is a 45 year old female with history of stage IV breast cancer with bony metastasis who is admitted for uncontrolled pain. She was admitted around a week ago with similar complaints at which point in time her methadone was titrated to 10 mg twice daily as well as transitioning to oral Dilaudid for rescue medication. She reports this regimen worked well following discharge. Her pain then began to gradually increase. She reports that she had been taking extra doses of her methadone since that time. She has been averaging 3-4 doses daily. She had an acute exacerbation of her pain when she missed doses of her rescue pain medication and this is when her pain get out of control and she came to the emergency room.  She currently reports her pain is 9 out of 10 in the regimen she is currently out of the hospital has not been helping. She is not currently being scheduled her outpatient methadone and has been receiving 2 mg IV morphine as needed. This is a significant dose reduction from her outpatient regimen.  She reports the pain is predominantly in her hips and radiates down the bottom of her legs.  SUMMARY OF RECOMMENDATIONS   - We'll restart outpatient methadone. She has been taking 3-4 doses of 10 mg daily. As it is unclear to me exactly what she has been taking, will restart with 10 milligrams 3 times daily. - Plan for initiation of Dilaudid PCA in order to determine her overall  opioid need for adequate pain control.  Code Status/Advance Care Planning:    Code Status Orders        Start     Ordered   09/28/15 1625  Full code   Continuous     09/28/15 1624    Code Status History    Date Active Date Inactive Code Status Order ID Comments User Context   09/18/2015  6:06 PM 09/21/2015  5:26 PM Full Code [id number]  Shelly Cellar, MD Inpatient   12/09/2014  2:42 PM 12/10/2014  4:45 PM Full Code [id number]  Shelly Fowler, MD Inpatient   04/23/2014  3:41 PM 04/25/2014  6:38 PM Full Code [id number]  Shelly Klein, MD Inpatient     Symptom Management:   Pain management as above  Palliative Prophylaxis:   Frequent Pain Assessment  Psycho-social/Spiritual:   Desire for further Chaplaincy support:no  Additional Recommendations: Caregiving  Support/Resources  Prognosis:   Unable to determine  Discharge Planning: To be determined by Hospital course.      Primary Diagnoses: Present on Admission:  . Pain . Metastatic cancer ([place]) . Pain from bone  metastases ([place]) . Anemia, unspecified . Pain due to malignant neoplasm metastatic to bone [facility])  I have reviewed the medical record, interviewed the patient and family, and examined the patient. The following aspects are pertinent.  Past Medical History  Diagnosis Date  . Hypertension   . Sarcoidosis ([place])     no problems per patient   . Anemia   . Complication of anesthesia     makes pt. restless and unable to be still  . High cholesterol   . Thyroid cancer ([place]) 2015  . Type II diabetes mellitus ([place])   . Breast cancer ([place]) 2015    left upper outer;  had chemo  . Radiation 06/23/14-08/11/14    Invasive ductal ca o left breast  . SVD (spontaneous vaginal delivery)     x 2  . Heart murmur     never had any problems as adult  . Bronchitis 2014  . Depression     no meds currently  . Anxiety     no meds currently  . GERD (gastroesophageal reflux disease)    Social History   Social History  .  Marital Status: Single    Spouse Name: N/A  . Number of Children: 2  . Years of Education: N/A   Occupational History  . [employer]    Social History Main Topics  . Smoking status: Current Some Day Smoker -- 0.25 packs/day for 25 years    Types: Cigarettes  . Smokeless tobacco: Never Used     Comment: 04/23/2014 "using vapor; down to 3-4 cigarettes/day now"  . Alcohol Use: 6.0 oz/week    4 Cans of beer, 4 Shots of liquor, 2 Standard drinks or equivalent per week  . Drug Use: No  . Sexual Activity: Not Currently    Birth Control/ Protection: None     Comment: hx chemo   Other Topics Concern  . None   Social History Narrative   Family History  Problem Relation Age of Onset  . Cancer Paternal Aunt     "bone cancer"; deceased 38s  . Cancer Paternal Uncle     stomach cancer; deceased 32s  . Cancer Paternal Aunt     unk. primary; currently 58s  . Prostate cancer Maternal Grandfather 31  . Asthma Mother    Scheduled Meds: . dexamethasone  4 mg Oral TID  . docusate sodium  100 mg Oral BID  . enoxaparin (LOVENOX) injection  40 mg Subcutaneous Q24H  . ferrous sulfate  325 mg Oral Q breakfast  . FLUoxetine  20 mg Oral Daily  . gabapentin  300 mg Oral BID WC  . gabapentin  600 mg Oral QHS  . HYDROmorphone   Intravenous Q4H  . lactulose  20 g Oral BID  . letrozole  2.5 mg Oral Daily  . metFORMIN  1,000 mg Oral BID WC  . methadone  10 mg Oral Q8H  . multivitamin with minerals  1 tablet Oral Daily  . palbociclib  100 mg Oral Q breakfast  . senna  1 tablet Oral BID  . sodium chloride flush  3 mL Intravenous Q12H   Continuous Infusions: . sodium chloride 75 mL/hr at 09/29/15 0949   PRN Meds:.sodium chloride, acetaminophen **OR** acetaminophen, albuterol, diphenhydrAMINE **OR** diphenhydrAMINE, naloxone **AND** sodium chloride flush, ondansetron **OR** ondansetron (ZOFRAN) IV, sodium chloride flush Medications Prior to Admission:  Prior to Admission medications   Medication  Sig Start Date End Date Taking? Authorizing Provider  albuterol (PROVENTIL HFA;VENTOLIN HFA) 108 (90  BASE) MCG/ACT inhaler Inhale 2 puffs into the lungs every 6 (six) hours as needed for wheezing. Reported on 09/01/2015   Yes Historical Provider, MD  dexamethasone (DECADRON) 4 MG tablet Take 1 tablet (4 mg total) by mouth 3 (three) times daily. 09/21/15  Yes Shelly Cellar, MD  ferrous sulfate 325 (65 FE) MG tablet Take 325 mg by mouth daily with breakfast.    Yes Historical Provider, MD  FLUoxetine (PROZAC) 20 MG tablet Take 20 mg by mouth every morning.   Yes Historical Provider, MD  gabapentin (NEURONTIN) 300 MG capsule Take 2 capsules (600 mg total) by mouth 2 (two) times daily. Patient taking differently: Take 300-600 mg by mouth 3 (three) times daily. Take 300mg  in the morning and afternoon and 600mg  in the evening 02/18/15  Yes Heather F Boelter, NP  HYDROmorphone (DILAUDID) 8 MG tablet Take 1 tablet (8 mg total) by mouth every 3 (three) hours as needed for severe pain. 09/21/15  Yes Shelly Cellar, MD  letrozole [facility]) 2.5 MG tablet Take 1 tablet (2.5 mg total) by mouth daily. 08/06/15  Yes Chauncey Cruel, MD  metFORMIN (GLUCOPHAGE) 1000 MG tablet Take 1,000 mg by mouth 2 (two) times daily with a meal.  04/13/14  Yes Historical Provider, MD  methadone (DOLOPHINE) 10 MG tablet Take 1 tablet (10 mg total) by mouth every 12 (twelve) hours. Patient taking differently: Take 10 mg by mouth every 8 (eight) hours.  09/21/15  Yes Shelly Cellar, MD  morphine (MS CONTIN) 15 MG 12 hr tablet Take 15 mg by mouth every 12 (twelve) hours.   Yes Historical Provider, MD  Multiple Vitamin (MULTIVITAMIN WITH MINERALS) TABS tablet Take 1 tablet by mouth daily.   Yes Historical Provider, MD  palbociclib (IBRANCE) 100 MG capsule Take 1 capsule (100 mg total) by mouth daily with breakfast. Take whole with food. 09/01/15  Yes Laurie Panda, NP  lactulose (CHRONULAC) 10 GM/15ML solution Take 30 mLs (20 g total) by  mouth 2 (two) times daily. 09/21/15   Shelly Cellar, MD   Allergies  Allergen Reactions  . Nyquil Multi-Symptom [Pseudoeph-Doxylamine-Dm-Apap] Itching and Other (See Comments)    Skin peels on hands, and feet   Review of Systems  Physical Exam  Vital Signs: BP 125/76 mmHg  Pulse 65  Temp(Src) 98.4 F (36.9 C) (Oral)  Resp 16  Ht 5\' 3"  (1.6 m)  Wt 83.915 kg (185 lb)  BMI 32.78 kg/m2  SpO2 99%  LMP 12/07/2014 Pain Assessment: No/denies pain   Pain Score: 9    SpO2: SpO2: 99 % O2 Device:SpO2: 99 % O2 Flow Rate: .   IO: Intake/output summary:  Intake/Output Summary (Last 24 hours) at 09/29/15 1001 Last data filed at 09/29/15 0400  Gross per 24 hour  Intake      0 ml  Output    700 ml  Net   -700 ml    LBM: Last BM Date: 09/28/15 Baseline Weight: Weight: 83.915 kg (185 lb) Most recent weight: Weight: 83.915 kg (185 lb)     Palliative Assessment/Data:   Flowsheet Rows        Most Recent Value   Intake Tab    Referral Department  Hospitalist   Unit at Time of Referral  Med/Surg Unit   Palliative Care Primary Diagnosis  Cancer   Date Notified  09/28/15   Palliative Care Type  Return patient Palliative Care   Reason for referral  Pain   Date of Admission  09/28/15  Date first seen by Palliative Care  09/29/15   # of days Palliative referral response time  1 Day(s)   # of days IP prior to Palliative referral  0   Clinical Assessment    Palliative Performance Scale Score  40%   Pain Max last 24 hours  10   Pain Min Last 24 hours  5   Psychosocial & Spiritual Assessment    Palliative Care Outcomes       Time In: 0755 Time Out: 0910 Time Total: 75 Greater than 50%  of this time was spent counseling and coordinating care related to the above assessment and plan.  Signed by: Micheline Rough, MD   Please contact Palliative Medicine Team phone at [phone] for questions and concerns.

## 2015-09-30 ENCOUNTER — Other Ambulatory Visit: Payer: Self-pay | Admitting: Oncology

## 2015-09-30 ENCOUNTER — Telehealth: Payer: Self-pay | Admitting: Oncology

## 2015-09-30 DIAGNOSIS — Z515 Encounter for palliative care: Secondary | ICD-10-CM | POA: Insufficient documentation

## 2015-09-30 DIAGNOSIS — R52 Pain, unspecified: Secondary | ICD-10-CM | POA: Insufficient documentation

## 2015-09-30 LAB — GLUCOSE, CAPILLARY
GLUCOSE-CAPILLARY: 92 mg/dL (ref 65–99)
Glucose-Capillary: 185 mg/dL — ABNORMAL HIGH (ref 65–99)
Glucose-Capillary: 81 mg/dL (ref 65–99)
Glucose-Capillary: 98 mg/dL (ref 65–99)
Glucose-Capillary: 91 mg/dL (ref 65–99)

## 2015-09-30 MED ORDER — METHADONE HCL 5 MG PO TABS
15.0000 mg | ORAL_TABLET | Freq: Three times a day (TID) | ORAL | Status: DC
  Administered 2015-09-30 – 2015-10-03 (×9): 15 mg via ORAL
  Filled 2015-09-30 (×9): qty 1

## 2015-09-30 MED ORDER — POLYETHYLENE GLYCOL 3350 17 G PO PACK
17.0000 g | PACK | Freq: Every day | ORAL | Status: DC
  Administered 2015-09-30 – 2015-10-03 (×4): 17 g via ORAL
  Filled 2015-09-30 (×4): qty 1

## 2015-09-30 MED ORDER — HYDROMORPHONE HCL 2 MG PO TABS: 2.0000 mg | ORAL_TABLET | ORAL | Status: DC | PRN

## 2015-09-30 MED ORDER — METHADONE HCL 5 MG PO TABS: 15.0000 mg | ORAL_TABLET | Freq: Three times a day (TID) | ORAL | Status: DC

## 2015-09-30 MED ORDER — METHADONE HCL 5 MG PO TABS
5.0000 mg | ORAL_TABLET | ORAL | Status: AC
  Administered 2015-09-30: 5 mg via ORAL
  Filled 2015-09-30: qty 1

## 2015-09-30 NOTE — Progress Notes (Signed)
For methadone 5 mg tablets, 90, to take 3 tablets every 8 hours round-the-clock, and hydromorphone, 2 mg, 60 tablets, to take 1 or 2 every 2 hours as needed for pain. These will be taken to the [facility] pharmacy by the patient's family-- the phamacy is not open on weekends.

## 2015-09-30 NOTE — Progress Notes (Signed)
Daily Progress Note   Patient Name: Shelly Hall       Date: 09/30/2015 DOB: [birth year]  Age: 45 y.o. MRN#: [id number] Attending Physician: Nita Sells, MD Primary Care Physician: Elyn Peers, MD Admit Date: 09/28/2015  Reason for Consultation/Follow-up: Pain control  Subjective: [name] reports that her pain is better since starting dilaudid PCA but still with significant pain overall.  Currently 7/10.  Has been averaging 1mg /hr of dilaudid for the last several hours.  Length of Stay: 1  Current Medications: Scheduled Meds:  . dexamethasone  4 mg Oral Q12H  . docusate sodium  100 mg Oral BID  . enoxaparin (LOVENOX) injection  40 mg Subcutaneous Q24H  . feeding supplement (ENSURE ENLIVE)  237 mL Oral BID BM  . ferrous sulfate  325 mg Oral Q breakfast  . FLUoxetine  20 mg Oral Daily  . gabapentin  300 mg Oral BID WC  . gabapentin  600 mg Oral QHS  . HYDROmorphone   Intravenous Q4H  . lactulose  20 g Oral BID  . letrozole  2.5 mg Oral Daily  . metFORMIN  1,000 mg Oral BID WC  . methadone  15 mg Oral Q8H  . multivitamin with minerals  1 tablet Oral Daily  . palbociclib  100 mg Oral Q breakfast  . polyethylene glycol  17 g Oral Daily  . senna  1 tablet Oral BID  . sodium chloride flush  3 mL Intravenous Q12H    Continuous Infusions: . sodium chloride 75 mL/hr at 09/30/15 1230    PRN Meds: sodium chloride, acetaminophen **OR** acetaminophen, albuterol, diphenhydrAMINE **OR** diphenhydrAMINE, naloxone **AND** sodium chloride flush, ondansetron **OR** ondansetron (ZOFRAN) IV, sodium chloride flush  Physical Exam  General: Alert, awake, in mild distress.  HEENT: No bruits, no goiter, no JVD Heart: Regular rate and rhythm. No murmur appreciated. Lungs: Good air  movement, clear Abdomen: Soft, nontender, nondistended, positive bowel sounds.  Ext: No significant edema Skin: Warm and dry Neuro: Grossly intact, nonfocal.          Vital Signs: BP 133/73 mmHg  Pulse 68  Temp(Src) 98.1 F (36.7 C) (Oral)  Resp 19  Ht 5\' 3"  (1.6 m)  Wt 81.647 kg (180 lb)  BMI 31.89 kg/m2  SpO2 98%  LMP 12/07/2014 SpO2: SpO2: 98 % O2 Device: O2  Device: Not Delivered O2 Flow Rate:    Intake/output summary:  Intake/Output Summary (Last 24 hours) at 09/30/15 1957 Last data filed at 09/30/15 1937  Gross per 24 hour  Intake    960 ml  Output      0 ml  Net    960 ml   LBM: Last BM Date: 09/29/15 Baseline Weight: Weight: 83.915 kg (185 lb) Most recent weight: Weight: 81.647 kg (180 lb)       Palliative Assessment/Data:    Flowsheet Rows        Most Recent Value   Intake Tab    Referral Department  Hospitalist   Unit at Time of Referral  Med/Surg Unit   Palliative Care Primary Diagnosis  Cancer   Date Notified  09/28/15   Palliative Care Type  Return patient Palliative Care   Reason for referral  Pain   Date of Admission  09/28/15   Date first seen by Palliative Care  09/29/15   # of days Palliative referral response time  1 Day(s)   # of days IP prior to Palliative referral  0   Clinical Assessment    Palliative Performance Scale Score  40%   Pain Max last 24 hours  10   Pain Min Last 24 hours  5   Psychosocial & Spiritual Assessment    Palliative Care Outcomes       Patient Active Problem List   Diagnosis Date Noted  . Diverticula of colon 09/28/2015  . Pain 09/28/2015  . Encounter for palliative care   . Metastatic cancer ([place]) 09/18/2015  . Cancer associated pain 09/18/2015  . Pain from bone metastases ([place]) 08/27/2015  . Recurrent cancer of left breast ([place]) 08/07/2015  . Pain due to malignant neoplasm metastatic to bone ([place]) 08/06/2015  . Genetic testing 04/09/2015  . Lymphedema of arm 02/18/2015  . S/P hysterectomy 12/09/2014    . Lymphedema 11/17/2014  . Gum symptoms 03/18/2014  . Peripheral neuropathy due to chemotherapy ([place]) 01/22/2014  . Hot flashes 01/15/2014  . 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 Esophageal reflux 11/27/2013  . 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 Recurrent genital herpes 11/06/2013  . Chemotherapy induced neutropenia ([place]) 11/06/2013  . Anemia, unspecified 11/06/2013  . Diabetes mellitus type II, non insulin dependent ([place]) 11/06/2013  . Left thyroid nodule 11/06/2013  . Sarcoidosis ([place]) 10/15/2013  . Breast cancer of upper-outer quadrant of left female breast ([place]) 10/10/2013  . Mediastinal lymphadenopathy 11/21/2012  . Tobacco use disorder 11/21/2012    Palliative Care Assessment & Plan   Patient Profile: 45 year old female with breast cancer with bone metastasis  Recommendations/Plan:  Pain: On further discussion, patient has been taking methadone 10mg  every 6 hours for at least 5 days prior to admit (40mg  per day).  Agree with Dr. Jana Hakim that she will likely require 15mg  TID and will increase to this this evening.  We will need to continue to monitor closely for the next several days and continue to adjust as needed.  Goals of Care and Additional Recommendations:  Limitations on Scope of Treatment: Full Scope Treatment  Code Status:    Code Status Orders        Start     Ordered   09/28/15 1625  Full code   Continuous     09/28/15 1624    Code Status History    Date Active Date Inactive Code Status Order ID Comments User Context   09/18/2015  6:06 PM 09/21/2015  5:26 PM Full Code [id number]  Kelvin Cellar, MD Inpatient   12/09/2014  2:42 PM 12/10/2014  4:45 PM Full Code [id number]  Cheri Fowler, MD Inpatient   04/23/2014  3:41 PM 04/25/2014  6:38 PM Full Code [id number]  [name], MD Inpatient       Prognosis:   Unable to determine  Discharge Planning:  Home with [employer] was discussed with patient  Thank you for  allowing the Palliative Medicine Team to assist in the care of this patient.   Time In: 1100 Time Out: 1130 Total Time 30 Prolonged Time Billed  No      Greater than 50%  of this time was spent counseling and coordinating care related to the above assessment and plan.  Micheline Rough, MD  Please contact Palliative Medicine Team phone at [phone] for questions and concerns.

## 2015-09-30 NOTE — Progress Notes (Signed)
PROGRESS NOTE    Shelly Hall  [id number] DOB: 04/06/1971 DOA: 09/28/2015 PCP: Elyn Peers, MD  Outpatient Specialists:  Dr Magrinat-oncology   Brief Narrative:   33 ? 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 Marily Memos and I discussed pain management-Methadone 15 tid, Dilaudid 2 mg 1-2 tab q 2 prn--He will re-address pain management as OP if prn 10/04/15  EKG ordered for 4/28 am  patient tells me that the majority of her pain is in her hips bilaterally-plain film x-ray does not have any consideration for fracture at this time   May need to taper from Decadron 4 mg 3 times a day to a lower dose in the near future  Continue gabapentin 600 twice a day for neuropathic component to pain  Add K pad, low dose Toradol for breakthrough and nonsteroidal component for pain management   continue letrozole, Palbaciclib  type II, non insulin dependent (HCC) Blood sugars ranging 94-216 Continue a.m. checks  continue metfomin  reported history of thyroid cancer? Outpatient screening  Sarcoid quiesecent  Multi-modal anemia-Chronic disease/of malignancy WBC 2.9, hemoglobin 9.8 and stable RDW increased Outpatient further management and saturation levels as per oncologist  Bipolar continue Prozac 20 q AM     VT prophylaxis: Lovenox Code Status: Full Family Communication: none + Disposition Plan: when  pain adequately controlled-still on PCA   Consultants:   Pallaitive for pain management  Procedures:   none  Antimicrobials:   none    Subjective:  Looks to be in less pain eaing lunc at bedside No n/v/chills nor rigor Passing stool  Objective: Filed Vitals:   09/30/15 0801 09/30/15 1037 09/30/15 1233 09/30/15 1259  BP:  138/74  122/75  Pulse:  67  68  Temp:  98.2 F (36.8 C)  97.6 F (36.4 C)  TempSrc:  Oral  Oral  Resp: 11 15 11 16   Height:      Weight:      SpO2: 100% 100% 96% 98%    Intake/Output Summary (Last 24 hours) at 09/30/15 1332 Last data filed at 09/30/15 0900  Gross per 24 hour  Intake    720 ml  Output      0 ml  Net    720 ml   Filed Weights   09/28/15 1629 09/30/15 0648  Weight: 83.915 kg (185 lb) 81.647 kg (180 lb)    Examination:  General exam: calm Respiratory system: Clear to auscultation. Respiratory effort normal. Cardiovascular system: S1 & S2 heard, RRR. No JVD, murmurs, rubs, gallops or clicks. No pedal edema. Gastrointestinal system: Abdomen is nondistended, soft and nontender. Central nervous system: Alert and oriented. No focal neurological deficits.     Data Reviewed: I have personally reviewed following labs and imaging studies  CBC:  Recent Labs Lab 09/28/15 1318 09/29/15 0344  WBC 3.4* 2.9*  NEUTROABS 2.5  --   HGB 9.7*  9.8*  HCT 29.4* 30.1*  MCV 88.8 90.7  PLT 284 [id number]   Basic Metabolic Panel:  Recent Labs Lab 09/28/15 1318 09/29/15 0344  NA 141 137  K 4.2 4.9  CL 103 104  CO2 29 26  GLUCOSE 94 172*  BUN 23* 17  CREATININE 1.18* 1.10*  CALCIUM 8.8* 8.6*   GFR: Estimated Creatinine Clearance: 64.7 mL/min (by C-G formula based on Cr of 1.1). Liver Function Tests: No results for input(s): AST, ALT, ALKPHOS, BILITOT, PROT, ALBUMIN in the last 168 hours. No results for input(s): LIPASE, AMYLASE in the last 168 hours. No results for input(s): AMMONIA in the last 168 hours. Coagulation  Profile: No results for input(s): INR, PROTIME in the last 168 hours. Cardiac Enzymes: No results for input(s): CKTOTAL, CKMB, CKMBINDEX, TROPONINI in the last 168 hours. BNP (last 3 results) No results for input(s): PROBNP in the last 8760 hours. HbA1C: No results for input(s): HGBA1C in the last 72 hours. CBG:  Recent Labs Lab 09/29/15 0750 09/29/15 1250 09/29/15 1731 09/30/15 0744 09/30/15 1206  GLUCAP 121* 147* 216* 81 98   Lipid Profile: No results for input(s): CHOL, HDL, LDLCALC, TRIG, CHOLHDL, LDLDIRECT in the last 72 hours. Thyroid Function Tests: No results for input(s): TSH, T4TOTAL, FREET4, T3FREE, THYROIDAB in the last 72 hours. Anemia Panel: No results for input(s): VITAMINB12, FOLATE, FERRITIN, TIBC, IRON, RETICCTPCT in the last 72 hours. Urine analysis:    Component Value Date/Time   COLORURINE YELLOW 01/04/2012 0246   APPEARANCEUR CLOUDY* 01/04/2012 0246   LABSPEC 1.030 01/04/2012 0246   PHURINE 6.5 01/04/2012 0246   GLUCOSEU NEGATIVE 01/04/2012 0246   HGBUR LARGE* 01/04/2012 0246   BILIRUBINUR NEGATIVE 01/04/2012 0246   KETONESUR NEGATIVE 01/04/2012 0246   PROTEINUR NEGATIVE 01/04/2012 0246   UROBILINOGEN 0.2 01/04/2012 0246   NITRITE NEGATIVE 01/04/2012 0246   LEUKOCYTESUR NEGATIVE 01/04/2012 0246   Sepsis Labs:      Radiology Studies: Dg Hips Bilat With Pelvis 3-4 Views  09/29/2015  CLINICAL DATA:  Bilateral hip pain for 2 months, no known injury, initial encounter EXAM: DG HIP (WITH OR WITHOUT PELVIS) 3-4V BILAT COMPARISON:  None. FINDINGS: Pelvic ring is intact. The hip joints show no acute fracture or dislocation. No gross soft tissue abnormality is noted. IMPRESSION: No acute abnormality noted. Electronically Signed   By: Inez Catalina M.D.   On: 09/29/2015 15:53        Scheduled Meds: . dexamethasone  4 mg Oral Q12H  . docusate sodium  100 mg Oral BID  . enoxaparin (LOVENOX) injection  40 mg Subcutaneous Q24H  . feeding supplement  (ENSURE ENLIVE)  237 mL Oral BID BM  . ferrous sulfate  325 mg Oral Q breakfast  . FLUoxetine  20 mg Oral Daily  . gabapentin  300 mg Oral BID WC  . gabapentin  600 mg Oral QHS  . HYDROmorphone   Intravenous Q4H  . lactulose  20 g Oral BID  . letrozole  2.5 mg Oral Daily  . metFORMIN  1,000 mg Oral BID WC  . methadone  15 mg Oral Q8H  . multivitamin with minerals  1 tablet Oral Daily  . palbociclib  100 mg Oral Q breakfast  . polyethylene glycol  17 g Oral Daily  . senna  1 tablet Oral BID  . sodium chloride flush  3 mL Intravenous Q12H   Continuous Infusions: . sodium chloride 75 mL/hr at 09/30/15 1230     LOS: 1 day  Time spent: [place], MD [facility]   If 7PM-7AM, please contact night-coverage [email or web address] Password [email or web address] 09/30/2015, 1:32 PM

## 2015-09-30 NOTE — Telephone Encounter (Signed)
no answer on either number...mailed pt appt sched and letter

## 2015-09-30 NOTE — Progress Notes (Signed)
COURTESY NOTE: Appreciate your help to Ms Porche! Pain management remains a challenge. I like the idea of increasing methadone (likely will need 15 mg po TID) with dilaudid for breakthrough; as methadone titration is slow she may need MScontin through the transition. We can continue to work with [place] as outpatient if she is discharged over the next few days-- i will be out of town until 10/04/2015  She has a few more days on her current 21-day cycle of palbociclib. She continues on letrozole with good tolerance.   At discharge will need miralax daily, docusate 200 mg BIDE for bowel prophylaxis.  I will see her 10/08/2015 at 3 PM in the [place].   Again, greatly appreciate your help to this patient!  SUMMARY: 45 y.o. BRCA negative [place] woman with a history of sarcoidosis and breast cancer as follows:  (1) status post left breast and left axillary lymph node biopsy 10/03/2013, both positive for a clinical T2 N2, stage IIIA invasive ductal carcinoma, grade 2, estrogen and progesterone receptor positive, HER-2 negative, with an MIB-1 of 20%.  (2) Treated with neoadjuvant chemotherapy consisting of doxorubicin and cyclophosphamide in dose dense fashion x4, completed 12/11/2013, followed by paclitaxel weekly x12 (dose reduced by 20% at cycle 5 because of neuropathy concerns) completed 03/19/2014.  (3) status post left mastectomy and left axillary lymph node dissection 04/23/2014 for a pT1c pN2, stage IIIA invasive ductal carcinoma, grade 3, estrogen receptor 99% positive, progesterone receptor 4% positive, with no HER-2 amplification  (3) adjuvant radiation completed 08/11/2014 Left chest wall / 50.4 Gray @ 1.8 Gray per fraction x 28 fractions Left Supraclavicular fossa / 45 Gray _0 .8 Gray per fraction x 25 fractions Left PAB / 45 Gy at 1.8 Gray per fraction x 25 fractions Left scar / 10 Gray at [employer] per fraction x 5 fractions  (4) tamoxifen started 10/10/14, discontinued March  2017 with evidence of metastatic recurrence  (5) current smoker. Attending quitting cessation therapy with the moviation that she must quit before she has a breast reduction.   (6) genetic testing sent 10/16/2013 did not reveal any deleterious mutation in any of these genes. The genes tested were ATM, BARD1, BRCA1, BRCA2, BRIP1, CDH1, CHEK2, MRE11A, MUTYH, NBN, NF1, PALB2, PTEN, RAD50, RAD51C, RAD51D, and TP53.  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

## 2015-10-01 LAB — COMPREHENSIVE METABOLIC PANEL
ALT: 20 U/L (ref 14–54)
AST: 24 U/L (ref 15–41)
Albumin: 3.4 g/dL — ABNORMAL LOW (ref 3.5–5.0)
Alkaline Phosphatase: 170 U/L — ABNORMAL HIGH (ref 38–126)
Anion gap: 10 (ref 5–15)
BILIRUBIN TOTAL: 0.3 mg/dL (ref 0.3–1.2)
BUN: 21 mg/dL — ABNORMAL HIGH (ref 6–20)
CALCIUM: 9 mg/dL (ref 8.9–10.3)
CHLORIDE: 105 mmol/L (ref 101–111)
CO2: 24 mmol/L (ref 22–32)
CREATININE: 1.07 mg/dL — AB (ref 0.44–1.00)
Glucose, Bld: 165 mg/dL — ABNORMAL HIGH (ref 65–99)
Potassium: 5 mmol/L (ref 3.5–5.1)
Sodium: 139 mmol/L (ref 135–145)
Total Protein: 7 g/dL (ref 6.5–8.1)

## 2015-10-01 LAB — GLUCOSE, CAPILLARY
Glucose-Capillary: 135 mg/dL — ABNORMAL HIGH (ref 65–99)
Glucose-Capillary: 137 mg/dL — ABNORMAL HIGH (ref 65–99)
Glucose-Capillary: 111 mg/dL — ABNORMAL HIGH (ref 65–99)
Glucose-Capillary: 81 mg/dL (ref 65–99)

## 2015-10-01 LAB — CBC
HEMATOCRIT: 30.1 % — AB (ref 36.0–46.0)
HEMOGLOBIN: 9.9 g/dL — AB (ref 12.0–15.0)
MCH: 29.8 pg (ref 26.0–34.0)
MCHC: 32.9 g/dL (ref 30.0–36.0)
MCV: 90.7 fL (ref 78.0–100.0)
Platelets: 235 10*3/uL (ref 150–400)
RBC: 3.32 MIL/uL — AB (ref 3.87–5.11)
RDW: 18 % — ABNORMAL HIGH (ref 11.5–15.5)
WBC: 3.8 10*3/uL — AB (ref 4.0–10.5)

## 2015-10-01 MED ORDER — NICOTINE 21 MG/24HR TD PT24
21.0000 mg | MEDICATED_PATCH | Freq: Every day | TRANSDERMAL | Status: DC
  Administered 2015-10-02 (×2): 21 mg via TRANSDERMAL
  Filled 2015-10-01 (×2): qty 1

## 2015-10-01 MED ORDER — SENNA 8.6 MG PO TABS
2.0000 | ORAL_TABLET | Freq: Two times a day (BID) | ORAL | Status: DC
  Administered 2015-10-01 – 2015-10-03 (×4): 17.2 mg via ORAL
  Filled 2015-10-01 (×4): qty 2

## 2015-10-01 MED FILL — METHADONE HCL 5 MG TABLET: 5 | 30 days supply | Qty: 90 | Fill #0

## 2015-10-01 MED FILL — HYDROmorphone HCL 2 MG TABS: 2 | 4 days supply | Qty: 60 | Fill #0

## 2015-10-01 NOTE — Progress Notes (Signed)
PROGRESS NOTE    Shelly Hall  [name] DOB: [birth year] DOA: 09/28/2015 PCP: Elyn Peers, MD  Outpatient Specialists:  Dr Magrinat-oncology   Brief Narrative:   67 ? 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 Marily Memos and I discussed pain management-Methadone 15 tid, Dilaudid 2 mg 1-2 tab q 2 prn--He will re-address pain management as OP if prn 5/1/170--continue PCA today--has  Had some ? pain at night  Majority of her pain is in her hips bilaterally-plain film x-ray does not have any consideration for fracture at this time   May need to taper from Decadron 4 mg 3 times a day to a lower dose in the near future  Continue gabapentin 600 twice a day for neuropathic component to pain  Add K pad, low dose Toradol for breakthrough and nonsteroidal component for pain management   continue letrozole, Palbaciclib  type II, non insulin dependent (HCC) Blood sugars ranging 135-167 Continue a.m. checks  continue metfomin  reported history of thyroid cancer? Outpatient screening  Sarcoid quiesecent  Multi-modal anemia-Chronic disease/of malignancy WBC 2.9-->3.8, hemoglobin 9.9 Outpatient further management and saturation levels as per oncologist  Bipolar continue Prozac 20 q AM     VT prophylaxis: Lovenox Code Status: Full Family Communication: none + Disposition Plan: when pain adequately  controlled-still on PCA   Consultants:   Pallaitive for pain management  Procedures:   none  Antimicrobials:   none    Subjective:  Better overall Constipated Palliative care added senna No other issues  Objective: Filed Vitals:   10/01/15 0527 10/01/15 0800 10/01/15 1026 10/01/15 1241  BP: 154/82  164/87   Pulse: 60  64   Temp: 98.4 F (36.9 C)  97.8 F (36.6 C)   TempSrc: Oral  Oral   Resp: 11 13 13 16   Height:      Weight: 81.511 kg (179 lb 11.2 oz)     SpO2: 92% 95% 96% 100%    Intake/Output Summary (Last 24 hours) at 10/01/15 1437 Last data filed at 10/01/15 1026  Gross per 24 hour  Intake 7048.33 ml  Output      0 ml  Net 7048.33 ml   Filed Weights   09/28/15 1629 09/30/15 0648 10/01/15 0527  Weight: 83.915 kg (185 lb) 81.647 kg (180 lb) 81.511 kg (179 lb 11.2 oz)    Examination:  General exam: calm, seems to be brighter and in lesss distress today Respiratory system: Clear to auscultation. Respiratory effort normal. Cardiovascular system: S1 & S2 heard, RRR. No JVD, murmurs, rubs, gallops or clicks. No pedal edema. Gastrointestinal system: Abdomen is nondistended, soft and nontender. norebound Central nervous system: Alert and oriented. No focal neurological deficits.     Data Reviewed: I have personally reviewed following labs and imaging studies  CBC:  Recent Labs Lab 09/28/15 1318 09/29/15 0344 10/01/15 0331  WBC 3.4* 2.9* 3.8*  NEUTROABS  2.5  --   --   HGB 9.7* 9.8* 9.9*  HCT 29.4* 30.1* 30.1*  MCV 88.8 90.7 90.7  PLT 284 291 [id number]   Basic Metabolic Panel:  Recent Labs Lab 09/28/15 1318 09/29/15 0344 10/01/15 0331  NA 141 137 139  K 4.2 4.9 5.0  CL 103 104 105  CO2 29 26 24   GLUCOSE 94 172* 165*  BUN 23* 17 21*  CREATININE 1.18* 1.10* 1.07*  CALCIUM 8.8* 8.6* 9.0   GFR: Estimated Creatinine Clearance: 66.4 mL/min (by C-G formula based on Cr of 1.07). Liver Function Tests:  Recent Labs Lab 10/01/15 0331  AST  24  ALT 20  ALKPHOS 170*  BILITOT 0.3  PROT 7.0  ALBUMIN 3.4*   No results for input(s): LIPASE, AMYLASE in the last 168 hours. No results for input(s): AMMONIA in the last 168 hours. Coagulation Profile: No results for input(s): INR, PROTIME in the last 168 hours. Cardiac Enzymes: No results for input(s): CKTOTAL, CKMB, CKMBINDEX, TROPONINI in the last 168 hours. BNP (last 3 results) No results for input(s): PROBNP in the last 8760 hours. HbA1C: No results for input(s): HGBA1C in the last 72 hours. CBG:  Recent Labs Lab 09/30/15 1206 09/30/15 1702 09/30/15 2123 10/01/15 0752 10/01/15 1229  GLUCAP 98 92 91 135* 137*   Lipid Profile: No results for input(s): CHOL, HDL, LDLCALC, TRIG, CHOLHDL, LDLDIRECT in the last 72 hours. Thyroid Function Tests: No results for input(s): TSH, T4TOTAL, FREET4, T3FREE, THYROIDAB in the last 72 hours. Anemia Panel: No results for input(s): VITAMINB12, FOLATE, FERRITIN, TIBC, IRON, RETICCTPCT in the last 72 hours. Urine analysis:    Component Value Date/Time   COLORURINE YELLOW 01/04/2012 0246   APPEARANCEUR CLOUDY* 01/04/2012 0246   LABSPEC 1.030 01/04/2012 0246   PHURINE 6.5 01/04/2012 0246   GLUCOSEU NEGATIVE 01/04/2012 0246   HGBUR LARGE* 01/04/2012 0246   BILIRUBINUR NEGATIVE 01/04/2012 0246   KETONESUR NEGATIVE 01/04/2012 0246   PROTEINUR NEGATIVE 01/04/2012 0246   UROBILINOGEN 0.2 01/04/2012 0246   NITRITE NEGATIVE 01/04/2012 0246   LEUKOCYTESUR NEGATIVE 01/04/2012 0246   Sepsis Labs:      Radiology Studies: Dg Hips Bilat With Pelvis 3-4 Views  09/29/2015  CLINICAL DATA:  Bilateral hip pain for 2 months, no known injury, initial encounter EXAM: DG HIP (WITH OR WITHOUT PELVIS) 3-4V BILAT COMPARISON:  None. FINDINGS: Pelvic ring is intact. The hip joints show no acute fracture or dislocation. No gross soft tissue abnormality is noted. IMPRESSION: No acute abnormality noted. Electronically Signed   By: Inez Catalina M.D.   On:  09/29/2015 15:53        Scheduled Meds: . dexamethasone  4 mg Oral Q12H  . docusate sodium  100 mg Oral BID  . enoxaparin (LOVENOX) injection  40 mg Subcutaneous Q24H  . feeding supplement (ENSURE ENLIVE)  237 mL Oral BID BM  . ferrous sulfate  325 mg Oral Q breakfast  . FLUoxetine  20 mg Oral Daily  . gabapentin  300 mg Oral BID WC  . gabapentin  600 mg Oral QHS  . HYDROmorphone   Intravenous Q4H  . letrozole  2.5 mg Oral Daily  . metFORMIN  1,000 mg Oral BID WC  . methadone  15 mg Oral Q8H  . multivitamin with minerals  1 tablet Oral Daily  . palbociclib  100 mg Oral Q breakfast  . polyethylene glycol  17 g Oral Daily  . senna  2 tablet Oral BID  . sodium chloride flush  3 mL Intravenous Q12H   Continuous Infusions: . sodium chloride 75 mL/hr at 10/01/15 0145     LOS: 2 days    Time spent: [place], MD [facility]   If 7PM-7AM, please contact night-coverage [email or web address] Password [facility] 10/01/2015, 2:37 PM

## 2015-10-01 NOTE — Progress Notes (Signed)
Daily Progress Note   Patient Name: Shelly Hall       Date: 10/01/2015 DOB: 03/23/1971  Age: 45 y.o. MRN#: [id number] Attending Physician: Nita Sells, MD Primary Care Physician: Elyn Peers, MD Admit Date: 09/28/2015  Reason for Consultation/Follow-up: Pain control  Subjective: Shelly Hall reports that her pain is better since starting increased dose of methadone, still requiring frequent use of dilaudid PCA.  Currently 7/10.  Overall PCA use has decreased slightly.  She reports that she has felt the urge to have bowel movement but has not had bowel movement today.  Length of Stay: 2  Current Medications: Scheduled Meds:  . dexamethasone  4 mg Oral Q12H  . docusate sodium  100 mg Oral BID  . enoxaparin (LOVENOX) injection  40 mg Subcutaneous Q24H  . feeding supplement (ENSURE ENLIVE)  237 mL Oral BID BM  . ferrous sulfate  325 mg Oral Q breakfast  . FLUoxetine  20 mg Oral Daily  . gabapentin  300 mg Oral BID WC  . gabapentin  600 mg Oral QHS  . HYDROmorphone   Intravenous Q4H  . lactulose  20 g Oral BID  . letrozole  2.5 mg Oral Daily  . metFORMIN  1,000 mg Oral BID WC  . methadone  15 mg Oral Q8H  . multivitamin with minerals  1 tablet Oral Daily  . palbociclib  100 mg Oral Q breakfast  . polyethylene glycol  17 g Oral Daily  . senna  1 tablet Oral BID  . sodium chloride flush  3 mL Intravenous Q12H    Continuous Infusions: . sodium chloride 75 mL/hr at 10/01/15 0145    PRN Meds: sodium chloride, acetaminophen **OR** acetaminophen, albuterol, diphenhydrAMINE **OR** diphenhydrAMINE, naloxone **AND** sodium chloride flush, ondansetron **OR** ondansetron (ZOFRAN) IV, sodium chloride flush  Physical Exam  General: Alert, awake, in no distress sitting in  bedside chair.  Heart: Regular rate and rhythm. No murmur appreciated. Lungs: Good air movement, clear Abdomen: Soft, nontender, nondistended, positive bowel sounds.  Ext: No significant edema Skin: Warm and dry Neuro: Grossly intact, nonfocal.          Vital Signs: BP 164/87 mmHg  Pulse 64  Temp(Src) 97.8 F (36.6 C) (Oral)  Resp 16  Ht 5\' 3"  (1.6 m)  Wt 81.511 kg (179 lb 11.2 oz)  BMI 31.84 kg/m2  SpO2 100%  LMP 12/07/2014 SpO2: SpO2: 100 % O2 Device: O2 Device: Not Delivered O2 Flow Rate:    Intake/output summary:   Intake/Output Summary (Last 24 hours) at 10/01/15 1418 Last data filed at 10/01/15 1026  Gross per 24 hour  Intake 7048.33 ml  Output      0 ml  Net 7048.33 ml   LBM: Last BM Date: 09/29/15 Baseline Weight: Weight: 83.915 kg (185 lb) Most recent weight: Weight: 81.511 kg (179 lb 11.2 oz)       Palliative Assessment/Data:    Flowsheet Rows        Most Recent Value   Intake Tab    Referral Department  Hospitalist   Unit at Time of Referral  Med/Surg Unit   Palliative Care Primary Diagnosis  Cancer   Date Notified  09/28/15   Palliative Care Type  Return patient Palliative Care   Reason for referral  Pain   Date of Admission  09/28/15   Date first seen by Palliative Care  09/29/15   # of days Palliative referral response time  1 Day(s)   # of days IP prior to Palliative referral  0   Clinical Assessment    Palliative Performance Scale Score  40%   Pain Max last 24 hours  10   Pain Min Last 24 hours  5   Psychosocial & Spiritual Assessment    Palliative Care Outcomes       Patient Active Problem List   Diagnosis Date Noted  . Intractable pain   . Palliative care encounter   . Diverticula of colon 09/28/2015  . Pain 09/28/2015  . Encounter for palliative care   . Metastatic cancer ([place]) 09/18/2015  . Cancer associated pain 09/18/2015  . Pain from bone metastases ([place]) 08/27/2015  . Recurrent cancer of left breast ([place]) 08/07/2015    . Pain due to malignant neoplasm metastatic to bone ([place]) 08/06/2015  . Genetic testing 04/09/2015  . Lymphedema of arm 02/18/2015  . S/P hysterectomy 12/09/2014  . Lymphedema 11/17/2014  . Gum symptoms 03/18/2014  . Peripheral neuropathy due to chemotherapy ([place]) 01/22/2014  . Hot flashes 01/15/2014  . 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 Esophageal reflux 11/27/2013  . 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 Recurrent genital herpes 11/06/2013  . Chemotherapy induced neutropenia ([place]) 11/06/2013  . Anemia, unspecified 11/06/2013  . Diabetes mellitus type II, non insulin dependent ([place]) 11/06/2013  . Left thyroid nodule 11/06/2013  . Sarcoidosis ([place]) 10/15/2013  . Breast cancer of upper-outer quadrant of left female breast ([place]) 10/10/2013  . Mediastinal lymphadenopathy 11/21/2012  . Tobacco use disorder 11/21/2012    Palliative Care Assessment & Plan   Patient Profile: 45 year old female with breast cancer with bone metastasis  Recommendations/Plan:  Pain: She reports that her pain is a little better controlled than yesterday, but she is still requiring frequent doses from her PCA. As her methadone wishes to increase yesterday, I would anticipate this to be the case. As it reaches steady state I would suspect that her overall need will continue to decrease. Continue same regimen. Her cousin will be able to pick up prescription for her outpatient meds so that she has them available at time of discharge.  Constipation: Continue MiraLAX. We'll increase senna to 2 tabs twice a day  Goals of Care and Additional Recommendations:  Limitations on Scope of Treatment: Full Scope Treatment  Code Status:    Code Status  Orders        Start     Ordered   09/28/15 1625  Full code   Continuous     09/28/15 1624    Code Status History    Date Active Date Inactive Code Status Order ID Comments User Context   09/18/2015  6:06 PM 09/21/2015  5:26 PM Full Code [id number]   Kelvin Cellar, MD Inpatient   12/09/2014  2:42 PM 12/10/2014  4:45 PM Full Code [id number]  Cheri Fowler, MD Inpatient   04/23/2014  3:41 PM 04/25/2014  6:38 PM Full Code [id number]  [name], MD Inpatient       Prognosis:   Unable to determine  Discharge Planning:  Home with [place] was discussed with patient  Thank you for allowing the Palliative Medicine Team to assist in the care of this patient.   Time In: 1220 Time Out: 1250 Total Time 30 Prolonged Time Billed  No      Greater than 50%  of this time was spent counseling and coordinating care related to the above assessment and plan.  Micheline Rough, MD  Please contact Palliative Medicine Team phone at [phone] for questions and concerns.

## 2015-10-02 LAB — GLUCOSE, CAPILLARY
GLUCOSE-CAPILLARY: 177 mg/dL — AB (ref 65–99)
GLUCOSE-CAPILLARY: 118 mg/dL — AB (ref 65–99)
Glucose-Capillary: 148 mg/dL — ABNORMAL HIGH (ref 65–99)
Glucose-Capillary: 125 mg/dL — ABNORMAL HIGH (ref 65–99)

## 2015-10-02 MED ORDER — HYDROMORPHONE HCL 2 MG PO TABS
2.0000 mg | ORAL_TABLET | Freq: Every day | ORAL | Status: DC
  Administered 2015-10-02: 2 mg via ORAL
  Filled 2015-10-02: qty 1

## 2015-10-02 MED ORDER — HYDROMORPHONE HCL 4 MG PO TABS: 4.0000 mg | ORAL_TABLET | ORAL | Status: DC | PRN

## 2015-10-02 MED ORDER — HYDROMORPHONE HCL 2 MG PO TABS
2.0000 mg | ORAL_TABLET | ORAL | Status: DC
  Administered 2015-10-02 (×2): 4 mg via ORAL
  Administered 2015-10-02: 2 mg via ORAL
  Administered 2015-10-02 – 2015-10-03 (×5): 4 mg via ORAL
  Administered 2015-10-03 (×2): 2 mg via ORAL
  Administered 2015-10-03 (×2): 4 mg via ORAL
  Filled 2015-10-02 (×9): qty 2
  Filled 2015-10-02: qty 1
  Filled 2015-10-02: qty 2
  Filled 2015-10-02: qty 1

## 2015-10-02 NOTE — Progress Notes (Signed)
Daily Progress Note   Patient Name: Shelly Hall       Date: 10/02/2015 DOB: Jan 30, 1971  Age: 45 y.o. MRN#: [id number] Attending Physician: Nita Sells, MD Primary Care Physician: Elyn Peers, MD Admit Date: 09/28/2015  Reason for Consultation/Follow-up: Pain control  Subjective: Ms. Stater reports that her pain Continues to improve.  Discussed with Dr. Verlon Au and we'll plan for transition off of PCA today. I discussed with patient the need to premedicate prior to any activities that generally will cause her pain as well as recommendation for her to wake in the middle of night to take a rescue dose (4 hours after going to bed) rather than sleeping until her pain gets out of control. Has her methadone gets to steady state, I think her need for this will continued to decrease.  Length of Stay: 3  Current Medications: Scheduled Meds:  . dexamethasone  4 mg Oral Q12H  . docusate sodium  100 mg Oral BID  . enoxaparin (LOVENOX) injection  40 mg Subcutaneous Q24H  . feeding supplement (ENSURE ENLIVE)  237 mL Oral BID BM  . ferrous sulfate  325 mg Oral Q breakfast  . FLUoxetine  20 mg Oral Daily  . gabapentin  300 mg Oral BID WC  . gabapentin  600 mg Oral QHS  . HYDROmorphone  2 mg Oral QHS  . HYDROmorphone  2-4 mg Oral Q2H  . letrozole  2.5 mg Oral Daily  . metFORMIN  1,000 mg Oral BID WC  . methadone  15 mg Oral Q8H  . multivitamin with minerals  1 tablet Oral Daily  . nicotine  21 mg Transdermal Daily  . palbociclib  100 mg Oral Q breakfast  . polyethylene glycol  17 g Oral Daily  . senna  2 tablet Oral BID  . sodium chloride flush  3 mL Intravenous Q12H    Continuous Infusions: . sodium chloride 75 mL/hr at 10/01/15 0145    PRN Meds: sodium chloride,  acetaminophen **OR** acetaminophen, albuterol, ondansetron **OR** ondansetron (ZOFRAN) IV, sodium chloride flush  Physical Exam  General: Alert, awake, in no distress sitting in bedside chair.  Heart: Regular rate and rhythm. No murmur appreciated. Lungs: Good air movement, clear Abdomen: Soft, nontender, nondistended, positive bowel sounds.  Ext: No significant edema Skin: Warm and dry Neuro: Grossly intact,  nonfocal.          Vital Signs: BP 147/76 mmHg  Pulse 68  Temp(Src) 98 F (36.7 C) (Oral)  Resp 14  Ht 5\' 3"  (1.6 m)  Wt 81.511 kg (179 lb 11.2 oz)  BMI 31.84 kg/m2  SpO2 100%  LMP 12/07/2014 SpO2: SpO2: 100 % O2 Device: O2 Device: Not Delivered O2 Flow Rate:    Intake/output summary:   Intake/Output Summary (Last 24 hours) at 10/02/15 1416 Last data filed at 10/02/15 0900  Gross per 24 hour  Intake   1635 ml  Output      0 ml  Net   1635 ml   LBM: Last BM Date: 10/01/15 Baseline Weight: Weight: 83.915 kg (185 lb) Most recent weight: Weight: 81.511 kg (179 lb 11.2 oz)       Palliative Assessment/Data:    Flowsheet Rows        Most Recent Value   Intake Tab    Referral Department  Hospitalist   Unit at Time of Referral  Med/Surg Unit   Palliative Care Primary Diagnosis  Cancer   Date Notified  09/28/15   Palliative Care Type  Return patient Palliative Care   Reason for referral  Pain   Date of Admission  09/28/15   Date first seen by Palliative Care  09/29/15   # of days Palliative referral response time  1 Day(s)   # of days IP prior to Palliative referral  0   Clinical Assessment    Palliative Performance Scale Score  40%   Pain Max last 24 hours  10   Pain Min Last 24 hours  5   Psychosocial & Spiritual Assessment    Palliative Care Outcomes       Patient Active Problem List   Diagnosis Date Noted  . Intractable pain   . Palliative care encounter   . Diverticula of colon 09/28/2015  . Pain 09/28/2015  . Encounter for palliative care     . Metastatic cancer ([place]) 09/18/2015  . Cancer associated pain 09/18/2015  . Pain from bone metastases ([place]) 08/27/2015  . Recurrent cancer of left breast ([place]) 08/07/2015  . Pain due to malignant neoplasm metastatic to bone ([place]) 08/06/2015  . Genetic testing 04/09/2015  . Lymphedema of arm 02/18/2015  . S/P hysterectomy 12/09/2014  . Lymphedema 11/17/2014  . Gum symptoms 03/18/2014  . Peripheral neuropathy due to chemotherapy ([place]) 01/22/2014  . Hot flashes 01/15/2014  . 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 Esophageal reflux 11/27/2013  . 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 Recurrent genital herpes 11/06/2013  . Chemotherapy induced neutropenia ([place]) 11/06/2013  . Anemia, unspecified 11/06/2013  . Diabetes mellitus type II, non insulin dependent ([place]) 11/06/2013  . Left thyroid nodule 11/06/2013  . Sarcoidosis ([place]) 10/15/2013  . Breast cancer of upper-outer quadrant of left female breast ([place]) 10/10/2013  . Mediastinal lymphadenopathy 11/21/2012  . Tobacco use disorder 11/21/2012    Palliative Care Assessment & Plan   Patient Profile: 45 year old female with breast cancer with bone metastasis  Recommendations/Plan:  Pain: Continue methadone 15 mg 3 times daily. Agree with plan to stop Dilaudid PCA today. We'll try to transition over to outpatient regimen as recommended by Dr. Griffith Citron. This includes methadone 15 mg 3 times per day as well as Dilaudid by mouth 2-4 mg every 2 hours as needed for breakthrough pain. I think until her methadone reaches steady state she'll require at least 4 mg of Dilaudid  when she needs a rescue dose. I therefore added on Dilaudid 4 mg by mouth every 2 hours as needed. If this is insufficient to control her pain, she would likely tolerate Dilaudid 8 mg by mouth dose for breakthrough pain.  Constipation: Continue MiraLAX. We'll increase senna to 2 tabs twice a day  Goals of Care and Additional Recommendations:  Limitations on Scope  of Treatment: Full Scope Treatment  Code Status:    Code Status Orders        Start     Ordered   09/28/15 1625  Full code   Continuous     09/28/15 1624    Code Status History    Date Active Date Inactive Code Status Order ID Comments User Context   09/18/2015  6:06 PM 09/21/2015  5:26 PM Full Code [id number]  Kelvin Cellar, MD Inpatient   12/09/2014  2:42 PM 12/10/2014  4:45 PM Full Code [id number]  Cheri Fowler, MD Inpatient   04/23/2014  3:41 PM 04/25/2014  6:38 PM Full Code [id number]  [name], MD Inpatient       Prognosis:   Unable to determine  Discharge Planning:  Home with [place] was discussed with patient  Thank you for allowing the Palliative Medicine Team to assist in the care of this patient.   Time In: 1200 Time Out: 1230 Total Time 30 Prolonged Time Billed  No      Greater than 50%  of this time was spent counseling and coordinating care related to the above assessment and plan.  Micheline Rough, MD  Please contact Palliative Medicine Team phone at [phone] for questions and concerns.

## 2015-10-02 NOTE — Progress Notes (Signed)
PROGRESS NOTE    Shelly Hall  [name] DOB: Apr 05, 1971 DOA: 09/28/2015 PCP: Shelly Peers, MD  Outpatient Specialists:  Dr Shelly Hall-oncology   Brief Narrative:   23 ? 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 clavicular biopsy L side= ER +, Prog -,  amplification On Letrozole palbaciclib    Assessment & Plan:   Principal Problem:  Pain from bone metastases (HCC)  Bone pain due to G-CSF  Metastatic cancer ([place]): stg IV breast Ca   Pain due to malignant neoplasm metastatic to bone [employer])  Greatly appreciate expertise of palliative medicine and management of pain-home dosages of medication include MS Contin 15 every 12, methadone 10 every 8, Dilaudid 8 mg 3 times a day severe pain Current consensus discussion between Palliative and I re: pain management-Methadone 15 tid, Dilaudid 2-4 mg 1-2 tab q 2 -He will re-address pain management as OP if prn 10/04/15-- PCA  D/c 4.29--has  Had some ? pain at night--attempt 6 mg dilaudid dose 10 pm 4.29  Majority of her pain is in her hips bilaterally-plain film x-ray does not have any consideration for fracture at this time   May need to taper from Decadron 4 mg 3 times a day to a lower dose in the near future  Continue gabapentin 600 twice a day for neuropathic component to pain  Add K pad, low dose Toradol for breakthrough and nonsteroidal component for pain management   continue letrozole, Palbaciclib  type II, non insulin dependent (HCC) Blood sugars ranging 148-177 Continue a.m. checks  continue metfomin  reported history of thyroid cancer? Outpatient screening  Sarcoid quiesecent  Multi-modal anemia-Chronic disease/of malignancy WBC 2.9-->3.8, hemoglobin 9.9 Outpatient further management and saturation levels as per oncologist  Bipolar continue Prozac 20 q AM     VT prophylaxis: Lovenox Code Status: Full Family Communication: none  + Disposition Plan: when pain adequately controlled-probably am 10/03/15   Consultants:   Pallaitive for pain management  Procedures:   none  Antimicrobials:   none    Subjective:  Better overall Constipated but had stool with senna Pain at night 03:00 am no fevwer  Objective: Filed Vitals:   10/02/15 0804 10/02/15 0925 10/02/15 1227 10/02/15 1320  BP:  140/79  147/76  Pulse:  67  68  Temp:  98.3 F (36.8 C)  98 F (36.7 C)  TempSrc:  Oral  Oral  Resp: 13 15 13 14   Height:      Weight:      SpO2: 100% 100% 100% 100%    Intake/Output Summary (Last 24 hours) at 10/02/15 1533 Last data filed at 10/02/15 0900  Gross per 24 hour  Intake   1275 ml  Output      0 ml  Net   1275 ml   Filed Weights   09/28/15 1629 09/30/15 0648 10/01/15 0527  Weight: 83.915 kg (185 lb) 81.647 kg (180 lb) 81.511 kg (179 lb 11.2 oz)    Examination:  General exam: calm, seems to be brighter and in lesss distress today Respiratory system: Clear to auscultation. Respiratory effort normal. Cardiovascular system: S1 & S2 heard, RRR. No JVD, murmurs, rubs, gallops or clicks. No pedal edema. Gastrointestinal system: Abdomen is nondistended, soft and nontender. noreboun     Data Reviewed: I have personally reviewed following labs and imaging studies  CBC:  Recent Labs Lab 09/28/15 1318 09/29/15 0344 10/01/15 0331  WBC 3.4* 2.9* 3.8*  NEUTROABS 2.5  --   --   HGB 9.7* 9.8* 9.9*  HCT 29.4* 30.1* 30.1*  MCV 88.8 90.7 90.7  PLT 284 291 [id number]   Basic Metabolic Panel:  Recent Labs Lab 09/28/15 1318 09/29/15 0344 10/01/15 0331  NA 141 137 139  K 4.2 4.9 5.0  CL 103 104 105  CO2 29 26 24   GLUCOSE 94 172* 165*  BUN 23* 17 21*  CREATININE 1.18* 1.10* 1.07*  CALCIUM 8.8* 8.6* 9.0   GFR: Estimated Creatinine Clearance: 66.4 mL/min (by C-G formula based on Cr of 1.07). Liver Function Tests:  Recent Labs Lab 10/01/15 0331  AST 24  ALT 20  ALKPHOS 170*  BILITOT 0.3   PROT 7.0  ALBUMIN 3.4*   No results for input(s): LIPASE, AMYLASE in the last 168 hours. No results for input(s): AMMONIA in the last 168 hours. Coagulation Profile: No results for input(s): INR, PROTIME in the last 168 hours. Cardiac Enzymes: No results for input(s): CKTOTAL, CKMB, CKMBINDEX, TROPONINI in the last 168 hours. BNP (last 3 results) No results for input(s): PROBNP in the last 8760 hours. HbA1C: No results for input(s): HGBA1C in the last 72 hours. CBG:  Recent Labs Lab 10/01/15 1229 10/01/15 1720 10/01/15 2135 10/02/15 0750 10/02/15 1145  GLUCAP 137* 111* 81 148* 177*   Lipid Profile: No results for input(s): CHOL, HDL, LDLCALC, TRIG, CHOLHDL, LDLDIRECT in the last 72 hours. Thyroid Function Tests: No results for input(s): TSH, T4TOTAL, FREET4, T3FREE, THYROIDAB in the last 72 hours. Anemia Panel: No results for input(s): VITAMINB12, FOLATE, FERRITIN, TIBC, IRON, RETICCTPCT in the last 72 hours. Urine analysis:    Component Value Date/Time   COLORURINE YELLOW 01/04/2012 0246   APPEARANCEUR CLOUDY* 01/04/2012 0246   LABSPEC 1.030 01/04/2012 0246   PHURINE 6.5 01/04/2012 0246   GLUCOSEU NEGATIVE 01/04/2012 0246   HGBUR LARGE* 01/04/2012 0246   BILIRUBINUR NEGATIVE 01/04/2012 0246   KETONESUR NEGATIVE 01/04/2012 0246   PROTEINUR NEGATIVE 01/04/2012 0246   UROBILINOGEN 0.2 01/04/2012 0246   NITRITE NEGATIVE 01/04/2012 0246   LEUKOCYTESUR NEGATIVE 01/04/2012 0246   Sepsis Labs:      Radiology Studies: No results found.      Scheduled Meds: . dexamethasone  4 mg Oral Q12H  . docusate sodium  100 mg Oral BID  . enoxaparin (LOVENOX) injection  40 mg Subcutaneous Q24H  . feeding supplement (ENSURE ENLIVE)  237 mL Oral BID BM  . ferrous sulfate  325 mg Oral Q breakfast  . FLUoxetine  20 mg Oral Daily  . gabapentin  300 mg Oral BID WC  . gabapentin  600 mg Oral QHS  . HYDROmorphone  2 mg Oral QHS  . HYDROmorphone  2-4 mg Oral Q2H  . letrozole   2.5 mg Oral Daily  . metFORMIN  1,000 mg Oral BID WC  . methadone  15 mg Oral Q8H  . multivitamin with minerals  1 tablet Oral Daily  . nicotine  21 mg Transdermal Daily  . palbociclib  100 mg Oral Q breakfast  . polyethylene glycol  17 g Oral Daily  . senna  2 tablet Oral BID  . sodium chloride flush  3 mL Intravenous Q12H   Continuous Infusions: . sodium chloride 75 mL/hr at 10/01/15 0145     LOS: 3 days    Time spent: [place], MD [facility] (P) [phone]   If 7PM-7AM, please contact night-coverage [email or web address] Password [facility] 10/02/2015, 3:33 PM

## 2015-10-03 MED ORDER — POLYETHYLENE GLYCOL 3350 17 G PO PACK: 17.0000 g | PACK | Freq: Every day | ORAL | Status: DC

## 2015-10-03 MED ORDER — NICOTINE 21 MG/24HR TD PT24: 21.0000 mg | MEDICATED_PATCH | Freq: Every day | TRANSDERMAL | Status: DC

## 2015-10-03 MED ORDER — SENNA 8.6 MG PO TABS: 2.0000 | ORAL_TABLET | Freq: Two times a day (BID) | ORAL | Status: DC

## 2015-10-03 MED ORDER — DEXAMETHASONE 4 MG PO TABS: 2.0000 mg | ORAL_TABLET | Freq: Three times a day (TID) | ORAL | Status: DC

## 2015-10-03 NOTE — Discharge Summary (Signed)
Physician Discharge Summary  Shelly Hall [name] DOB: [birth year] DOA: 09/28/2015  PCP: Elyn Peers, MD  Admit date: 09/28/2015 Discharge date: 10/03/2015  Time spent: 40 minutes  Recommendations for Outpatient Follow-up:  1. Rx already given to patient for Pain control 2. Decadron tapering to continue as OP--Went from Decadron 4 mg-->2 mg tid on day of d/c--this wa sgiven to patient at prior admission for boney pain 3. Bowel regimen Rx as OP senna 2 tab, miralax on d/c   Discharge Diagnoses:  Principal Problem:   Pain from bone metastases (HCC) Active Problems:   Anemia, unspecified   Diabetes mellitus type II, non insulin dependent (HCC)   Bone pain due to G-CSF   Pain due to malignant neoplasm metastatic to bone [facility])   Metastatic cancer (HCC)   Pain   Intractable pain   Palliative care encounter   Discharge Condition: improved  Diet recommendation:  hh low saklt  Filed Weights   09/30/15 0648 10/01/15 0527 10/03/15 0527  Weight: 81.647 kg (180 lb) 81.511 kg (179 lb 11.2 oz) 81.058 kg (178 lb 11.2 oz)    History of present illness:  44 ? 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 clavicular biopsy L side= ER +, Prog -,  amplification On Letrozole palbaciclib   Admitted recently for poor pain tolerance Readmitted 4/25 for inability to control pain of metastatic br Ca as OP  Hospital Course:  Metastatic cancer ([place]): stg IV breast Ca  Pain due to malignant neoplasm metastatic to bone [facility])  Greatly appreciate expertise of palliative medicine and management of pain-home dosages of medication include MS Contin 15 every 12, methadone 10 every 8, Dilaudid 8 mg 3 times a day severe pain  Consensus discussion  re: pain management-Methadone 15 tid, Dilaudid 2-4 mg 1-2 tab q 2        -He will re-address pain management as OP if prn 10/04/15-         - PCA D/c 4.29--         - Had some ?  pain at night-         -attempt 6 mg dilaudid dose 10 pm 4.29--seemed to work marginally  Majority of her pain is in her hips bilaterally-plain film x-ray does not have any consideration for fracture at this time   Tapered from Decadron 4 mg 3 times a day to a 2 mg tid on d/c  Continue gabapentin 600 twice a day for neuropathic component to pain  continue letrozole, Palbaciclib  type II, non insulin dependent (HCC) Blood sugars controlled Continue a.m. checks  continue metfomin  reported history of thyroid cancer? Outpatient screening  Sarcoid quiesecent  Multi-modal anemia-Chronic disease/of malignancy WBC 2.9-->3.8, hemoglobin 9.9 Outpatient further management and saturation levels as per oncologist  Bipolar continue Prozac 20 q AM  Stop smoking Patch Rx given  Consultations:  palliative care dr Domingo Cocking  CC Dr. Jana Hakim on d/c  Discharge Exam: Filed Vitals:   10/02/15 2053 10/03/15 0527  BP: 142/80 127/78  Pulse: 80   Temp: 97.9 F (36.6 C) 98.8 F (37.1 C)  Resp: 16 16    General: alert but a little sleepy Cardiovascular:  s1 s 2no m/r/g Respiratory: clear Mild discomfort in walking to bedside commode however  Discharge Instructions   Discharge Instructions    Diet - low sodium heart healthy    Complete by:  As directed  Discharge instructions    Complete by:  As directed   Please take you pain medications as has been Rx By Dr. Jana Hakim Recommend complete smoking cessation We will cut back your steroids to 2 mg three times a day and this can be tapered and     Increase activity slowly    Complete by:  As directed           Current Discharge Medication List    START taking these medications   Details  nicotine (NICODERM CQ - DOSED IN MG/24 HOURS) 21 mg/24hr patch Place 1 patch (21 mg total) onto the skin daily. Qty: 28 patch, Refills: 0    polyethylene glycol (MIRALAX / GLYCOLAX) packet Take 17 g by mouth daily. Qty: 14 each,  Refills: 0    senna (SENOKOT) 8.6 MG TABS tablet Take 2 tablets (17.2 mg total) by mouth 2 (two) times daily. Qty: 120 each, Refills: 0      CONTINUE these medications which have CHANGED   Details  dexamethasone (DECADRON) 4 MG tablet Take 0.5 tablets (2 mg total) by mouth 3 (three) times daily. Qty: 30 tablet, Refills: 0      CONTINUE these medications which have NOT CHANGED   Details  albuterol (PROVENTIL HFA;VENTOLIN HFA) 108 (90 BASE) MCG/ACT inhaler Inhale 2 puffs into the lungs every 6 (six) hours as needed for wheezing. Reported on 09/01/2015    ferrous sulfate 325 (65 FE) MG tablet Take 325 mg by mouth daily with breakfast.     FLUoxetine (PROZAC) 20 MG tablet Take 20 mg by mouth every morning.    gabapentin (NEURONTIN) 300 MG capsule Take 2 capsules (600 mg total) by mouth 2 (two) times daily. Qty: 120 capsule, Refills: 5    letrozole (FEMARA) 2.5 MG tablet Take 1 tablet (2.5 mg total) by mouth daily. Qty: 90 tablet, Refills: 4    metFORMIN (GLUCOPHAGE) 1000 MG tablet Take 1,000 mg by mouth 2 (two) times daily with a meal.     palbociclib (IBRANCE) 100 MG capsule Take 1 capsule (100 mg total) by mouth daily with breakfast. Take whole with food. Qty: 21 capsule, Refills: 2    HYDROmorphone (DILAUDID) 2 MG tablet Take 1-2 tablets (2-4 mg total) by mouth every 2 (two) hours as needed for severe pain. Qty: 60 tablet, Refills: 0    lactulose (CHRONULAC) 10 GM/15ML solution Take 30 mLs (20 g total) by mouth 2 (two) times daily. Qty: 240 mL, Refills: 0    methadone (DOLOPHINE) 5 MG tablet Take 3 tablets (15 mg total) by mouth every 8 (eight) hours. Qty: 90 tablet, Refills: 0      STOP taking these medications     morphine (MS CONTIN) 15 MG 12 hr tablet      Multiple Vitamin (MULTIVITAMIN WITH MINERALS) TABS tablet        Allergies  Allergen Reactions  . Nyquil Multi-Symptom [Pseudoeph-Doxylamine-Dm-Apap] Itching and Other (See Comments)    Skin peels on hands, and  feet      The results of significant diagnostics from this hospitalization (including imaging, microbiology, ancillary and laboratory) are listed below for reference.    Significant Diagnostic Studies: Dg Hips Bilat With Pelvis 3-4 Views  09/29/2015  CLINICAL DATA:  Bilateral hip pain for 2 months, no known injury, initial encounter EXAM: DG HIP (WITH OR WITHOUT PELVIS) 3-4V BILAT COMPARISON:  None. FINDINGS: Pelvic ring is intact. The hip joints show no acute fracture or dislocation. No gross soft tissue abnormality is noted.  IMPRESSION: No acute abnormality noted. Electronically Signed   By: Inez Catalina M.D.   On: 09/29/2015 15:53    Microbiology: No results found for this or any previous visit (from the past 240 hour(s)).   Labs: Basic Metabolic Panel:  Recent Labs Lab 09/28/15 1318 09/29/15 0344 10/01/15 0331  NA 141 137 139  K 4.2 4.9 5.0  CL 103 104 105  CO2 29 26 24   GLUCOSE 94 172* 165*  BUN 23* 17 21*  CREATININE 1.18* 1.10* 1.07*  CALCIUM 8.8* 8.6* 9.0   Liver Function Tests:  Recent Labs Lab 10/01/15 0331  AST 24  ALT 20  ALKPHOS 170*  BILITOT 0.3  PROT 7.0  ALBUMIN 3.4*   No results for input(s): LIPASE, AMYLASE in the last 168 hours. No results for input(s): AMMONIA in the last 168 hours. CBC:  Recent Labs Lab 09/28/15 1318 09/29/15 0344 10/01/15 0331  WBC 3.4* 2.9* 3.8*  NEUTROABS 2.5  --   --   HGB 9.7* 9.8* 9.9*  HCT 29.4* 30.1* 30.1*  MCV 88.8 90.7 90.7  PLT 284 291 235   Cardiac Enzymes: No results for input(s): CKTOTAL, CKMB, CKMBINDEX, TROPONINI in the last 168 hours. BNP: BNP (last 3 results) No results for input(s): BNP in the last 8760 hours.  ProBNP (last 3 results) No results for input(s): PROBNP in the last 8760 hours.  CBG:  Recent Labs Lab 10/01/15 2135 10/02/15 0750 10/02/15 1145 10/02/15 1659 10/02/15 2056  GLUCAP 81 148* 177* 118* 125*       Signed:  Nita Sells MD   [facility] 10/03/2015, 10:03 AM

## 2015-10-03 NOTE — Progress Notes (Signed)
Notified [facility] of dc to home with [facility] PT. Resumption of care orders received.  Jonnie Finner RN CCM Case Mgmt phone [phone]

## 2015-10-03 NOTE — Care Management Note (Addendum)
Case Management Note  Patient Details  Name: Shelly Hall MRN: [id number] Date of Birth: [birth year]  Subjective/Objective:  Breast cancer with bone metastases                    Action/Plan: Discharge Planning: AVS reviewed:  Active with [facility] Go for [facility] RN. Made [facility] aware of scheduled dc home today with resumption of care for [facility] RN. Pt has RW and 3n1 at home.    Expected Discharge Date:  10/03/2015              Expected Discharge Plan:  [place]  In-House Referral:  NA  Discharge planning Services  CM Consult  Post Acute Care Choice:  Home Health, Resumption of Svcs/PTA Provider Choice offered to:  Patient  DME Arranged:  N/A DME Agency:  NA  HH Arranged:  RN HH Agency:  [facility]  Status of Service:  Completed, signed off  Medicare Important Message Given:    Date Medicare IM Given:    Medicare IM give by:    Date Additional Medicare IM Given:    Additional Medicare Important Message give by:     If discussed at [place] of Stay Meetings, dates discussed:    Additional Comments:  Erenest Rasher, RN 10/03/2015, 3:00 PM

## 2015-10-03 NOTE — Progress Notes (Signed)
Patient discharged to home, all discharge medications and instructions reviewed and questions answered. Patient to be assisted to vehicle by wheelchair when ride arrives.  

## 2015-10-04 ENCOUNTER — Other Ambulatory Visit: Payer: Self-pay | Admitting: *Deleted

## 2015-10-04 ENCOUNTER — Other Ambulatory Visit: Payer: Self-pay | Admitting: Nurse Practitioner

## 2015-10-04 DIAGNOSIS — G893 Neoplasm related pain (acute) (chronic): Secondary | ICD-10-CM

## 2015-10-04 DIAGNOSIS — T451X5A Adverse effect of antineoplastic and immunosuppressive drugs, initial encounter: Secondary | ICD-10-CM

## 2015-10-04 DIAGNOSIS — R531 Weakness: Secondary | ICD-10-CM

## 2015-10-04 DIAGNOSIS — C50912 Malignant neoplasm of unspecified site of left female breast: Secondary | ICD-10-CM

## 2015-10-04 DIAGNOSIS — Z9181 History of falling: Secondary | ICD-10-CM

## 2015-10-04 DIAGNOSIS — C799 Secondary malignant neoplasm of unspecified site: Secondary | ICD-10-CM

## 2015-10-04 DIAGNOSIS — G620 Drug-induced polyneuropathy: Secondary | ICD-10-CM

## 2015-10-04 DIAGNOSIS — C50412 Malignant neoplasm of upper-outer quadrant of left female breast: Secondary | ICD-10-CM

## 2015-10-04 DIAGNOSIS — C7951 Secondary malignant neoplasm of bone: Secondary | ICD-10-CM

## 2015-10-05 ENCOUNTER — Other Ambulatory Visit: Payer: Self-pay | Admitting: *Deleted

## 2015-10-05 MED ORDER — GABAPENTIN 300 MG PO CAPS: 600.0000 mg | ORAL_CAPSULE | Freq: Two times a day (BID) | ORAL | Status: DC

## 2015-10-07 NOTE — Progress Notes (Signed)
   Department of Radiation Oncology  Phone:  [phone] Fax:        [phone]   Name: JACKI LEBOURGEOIS MRN: [id number]  DOB: [birth year]  Date: 10/08/2015  Follow Up Visit Note  Diagnosis: Breast cancer of upper-outer quadrant of left female breast [facility])   Staging form: Breast, AJCC 7th Edition     Clinical: Stage IIIA (T3, N1, cM0) - Unsigned       Staging comments: Staged at breast conference 10/15/13      Pathologic: Stage IV ([place], [place], M1) - Signed by Chauncey Cruel, MD on 08/06/2015  Recurrent cancer of left breast [facility])   Staging form: Breast, AJCC 7th Edition     Clinical: Stage IV (M1) - Signed by Chauncey Cruel, MD on 08/07/2015   Summary and Interval since last radiation: 1 month  08/23/2015-09/06/2015: Pelvis, right shoulder and left proximal femur were all treated to 30 GY in 10 fractions at 3 Gy per fraction  06/23/2014-08/11/2014:  Left chest wall / 50.4 Gray @ 1.8 [place] Cables per fraction x 28 fractions  Left Supraclavicular fossa / 45 Gray @1 .8 [place] Cables per fraction x 25 fractions  Left PAB / 45 Gy at 1.8 Gray per fraction x 25 fractions  Left scar / 10 Gray at [employer] per fraction x 5 fractions   Interval History: Merelene presents today for routine followup. Letrozole and Palbociclib were started on 08/13/15 and Zolendronate was started on 09/01/15. The patient most urgent complain is pain control. She ahs been in and out of the hospital for hip/pelvis pain.  The pain in her left femur and right shoulder is much improved but she struggles with low lumbar and sacral pain which of course is right where we treated. She is taking methadone now and feels that is slowly working into her system. She is accompanied by a friend.   Physical Exam:  Filed Vitals:   10/08/15 1600  BP: 113/77  Pulse: 92  Temp: 98.1 F (36.7 C)  TempSrc: Oral  Resp: 16  Height: 5\' 3"  (1.6 m)  SpO2: 97%   in pain. In a wheelchair.   IMPRESSION: Mccoy is a 45 y.o. female with metastatic stage IV  breast cancer recovering from the effects of radiation.  PLAN:  Continue methadone. Follow up in 2 months to discuss samarium.     Thea Silversmith, MD  This document serves as a record of services personally performed by Thea Silversmith, MD. It was created on her behalf by Darcus Austin, a trained medical scribe. The creation of this record is based on the scribe's personal observations and the provider's statements to them. This document has been checked and approved by the attending provider.

## 2015-10-07 NOTE — Progress Notes (Signed)
Shelly Hall is here for a one month for follow up visit for breast cancer upper-outer quadrant of left female breast to bone.  Skin status:Normal Lotion being used: Will start lotion with vitamin E. Have you seen your medical oncologist? Date If not ,when is appointment Dr. Jana Hakim 11-05-15 ER+,have started AI or Tamoxifen? If not, why? 08-13-15 Letrozole Discuss survivorship appointment:  Shelly Hall  No appointment Offer referral reading material for Survivorship, [employer] and [facility] given 09-06-15  Problems swallowing/pain/sore mouth:Decadron 2 mg. Tid  Thrush on tongue Appetite:Poor Pain:5/10 taking Methadone for pain Arm movement:Able to raise right arm without discomfort. Energy level:Having fatigue most of the day. BP 113/77 mmHg  Pulse 92  Temp(Src) 98.1 F (36.7 C) (Oral)  Resp 16  Ht 5\' 3"  (1.6 m)  SpO2 97%  LMP 12/07/2014

## 2015-10-08 ENCOUNTER — Telehealth: Payer: Self-pay | Admitting: Nurse Practitioner

## 2015-10-08 ENCOUNTER — Ambulatory Visit (HOSPITAL_BASED_OUTPATIENT_CLINIC_OR_DEPARTMENT_OTHER): Payer: Medicaid Other | Admitting: Oncology

## 2015-10-08 ENCOUNTER — Ambulatory Visit
Admission: RE | Admit: 2015-10-08 | Discharge: 2015-10-08 | Disposition: A | Discharge status: Home / Self Care | Payer: Medicaid Other | Source: Ambulatory Visit | Attending: Radiation Oncology | Admitting: Radiation Oncology

## 2015-10-08 ENCOUNTER — Encounter: Payer: Self-pay | Admitting: Radiation Oncology

## 2015-10-08 VITALS — BP 114/73 | HR 101 | Temp 97.8°F | Resp 20 | Ht 63.0 in | Wt 180.7 lb

## 2015-10-08 VITALS — BP 113/77 | HR 92 | Temp 98.1°F | Resp 16 | Ht 63.0 in

## 2015-10-08 DIAGNOSIS — C50812 Malignant neoplasm of overlapping sites of left female breast: Secondary | ICD-10-CM | POA: Diagnosis not present

## 2015-10-08 DIAGNOSIS — C50412 Malignant neoplasm of upper-outer quadrant of left female breast: Secondary | ICD-10-CM

## 2015-10-08 DIAGNOSIS — Z170 Estrogen receptor positive status [ER+]: Secondary | ICD-10-CM

## 2015-10-08 DIAGNOSIS — C773 Secondary and unspecified malignant neoplasm of axilla and upper limb lymph nodes: Secondary | ICD-10-CM | POA: Diagnosis not present

## 2015-10-08 DIAGNOSIS — C50912 Malignant neoplasm of unspecified site of left female breast: Secondary | ICD-10-CM

## 2015-10-08 DIAGNOSIS — C7951 Secondary malignant neoplasm of bone: Secondary | ICD-10-CM

## 2015-10-08 DIAGNOSIS — D869 Sarcoidosis, unspecified: Secondary | ICD-10-CM

## 2015-10-08 MED ORDER — PALBOCICLIB 100 MG PO CAPS: 100.0000 mg | ORAL_CAPSULE | Freq: Every day | ORAL | Status: DC

## 2015-10-08 MED ORDER — HYDROMORPHONE HCL 4 MG PO TABS: 4.0000 mg | ORAL_TABLET | ORAL | Status: DC | PRN

## 2015-10-08 MED ORDER — OMEPRAZOLE 40 MG PO CPDR: 40.0000 mg | DELAYED_RELEASE_CAPSULE | Freq: Every day | ORAL | Status: AC

## 2015-10-08 MED ORDER — GABAPENTIN 300 MG PO CAPS: ORAL_CAPSULE | ORAL | Status: DC

## 2015-10-08 MED ORDER — METHADONE HCL 5 MG PO TABS: 15.0000 mg | ORAL_TABLET | Freq: Three times a day (TID) | ORAL | Status: DC

## 2015-10-08 MED FILL — HYDROmorphone HCL 4 MG TABS: 4 | 23 days supply | Qty: 180 | Fill #0

## 2015-10-08 MED FILL — IBRANCE 100 MG CAPSULE: 100 | 28 days supply | Qty: 21 | Fill #0

## 2015-10-08 MED FILL — METHADONE HCL 5 MG TABLET: 5 | 30 days supply | Qty: 270 | Fill #0

## 2015-10-08 NOTE — Telephone Encounter (Signed)
appt made and avs printed °

## 2015-10-08 NOTE — Progress Notes (Signed)
[place]  [phone] [phone]    ID: Shelly Hall OB: July 22, 1970  MR#: [id number]  VPX#:[id number]  PCP: Elyn Peers, MD GYN:  [name] SU: [name] OTHER MD: Thea Silversmith  CHIEF COMPLAINT:  Left Breast Cancer, estrogen receptor positive  CURRENT TREATMENT: letrozole, palbociclib, zolendronate  BREAST CANCER HISTORY: From the original intake note:  Shelly Hall palpated a mass in her left breast mid-April 2015 and immediately brought it to Dr. Benjie Karvonen' attention. She was set up for bilateral screening mammography with tomography at [facility] hospital 09/19/2013. The possible distortion in the left breast and a nearby group of calcifications was felt to warrant further evaluation, and on 09/30/2013 she underwent unilateral left diagnostic mammography and ultrasonography. There was an irregular mass in the outer left breast associated with microcalcifications. 4 cm anterior to this there was another cluster of pleomorphic calcifications spanning 7 mm. He had more anteriorly there was an irregular mass associated with other calcifications. In total the abnormalities in the left breast spent approximately 10 cm, and is segmental distribution. On exam there was a palpable firm mass at the 2:30 o'clock position of the left breast 10 cm from the nipple. There was palpable adenopathy in the inferior left axilla. Ultrasound confirmed an irregular hypoechoic mass in the left breast measuring 4.6 cm. In addition, a 1.4 cm oval slightly irregular mass was noted at the 3:00 position and yet another mass measured 9 mm in the same area. Ultrasound of the left axilla showed a dominant 3.6 cm lymph node.  Biopsy of 2 of the left breast masses and the suspicious left breast lymph node on 10/03/2013 showed ([id number]) an invasive ductal carcinoma, grade 2, estrogen receptor 90% positive with strong staining intensity, progesterone receptor 40% positive but moderate  staining intensity, with an MIB-1 of 20% and no HER-2 amplification. (Because all 3 biopsies were morphologically identical, only one prognostic panel was sent).  On 10/10/2013 the patient underwent bilateral breast MRI. This showed numerous enhancing masses in the left breast, the largest measuring 3.5 cm, and the aggregate measuring 12.7 cm. There were numerous enlarged left axillary lymph nodes, at least 5 of which were enlarged, both at levels 1 and 2. The largest measured 2.3 cm. The right breast was negative.  The patient's subsequent history is as detailed below.  INTERVAL HISTORY: Shelly Hall returns today for follow-up of her metastatic estrogen receptor positive breast cancer accompanied by her friend Melody. Since the last visit here, [place] had an admission, 09/18/2015, for uncontrolled pain. Her methadone was continued and she was started on a dilaudid PCA. She was slowly weaned and at the time of discharge 10/03/2015 her pain management had been modified to methadone 15 mg 3 times a day, with Dilaudid 2-4 mg as needed every 2 hours.  However when she got home she read the old bottle which said one tablet 3 times a day so actually she has been on 5 mg 3 times a day until 2 days ago when a home health nurse came by and reviewed her medications. She has only been on the 15 mg 3 times a day for 48 hours. Accordingly her pain has been just about as poorly controlled as it was before she was admitted.  On the plus side she is on MiraLAX daily and also takes stool softeners. She is having fairly soft bowel movements every 2 days, which is very favorable.  The majority of her pain was in her hips, and plain  hip films were obtained 09/29/2015 which showed no acute fracture or dislocation and an intact pelvic ring.   Letrozole was continued through the hospitalization. She completes a palbociclib cycle today.  She returns today for further follow-up of her pain management and resumption of the  palbociclib.  REVIEW OF SYSTEMS: Shelly Hall is a little pink around the right eye. She is breathing fine, with no cough, pleurisy, or shortness of breath. She is not using any inhalers. Her pain particularly is in her back and pelvis and she has trouble walking as a result. She is using a cane but has a walker which she plans to be cup tomorrow. Her mother has been staying with her but her mother's can of the implications so she is planning to stay with Melody for at least a few days. The patient also is having some reflux issues. She feels anxious but not depressed. Her blood sugars are well controlled. She would like to be able to have some physical therapy and some Ensure but tells me Medicaid does not pay for either of those. A detailed review of systems today was otherwise stable  PAST MEDICAL HISTORY: Past Medical History  Diagnosis Date  . Hypertension   . Sarcoidosis ([place])     no problems per patient   . Anemia   . Complication of anesthesia     makes pt. restless and unable to be still  . High cholesterol   . Thyroid cancer ([place]) 2015  . Type II diabetes mellitus ([place])   . Breast cancer ([place]) 2015    left upper outer;  had chemo  . Radiation 06/23/14-08/11/14    Invasive ductal ca o left breast  . SVD (spontaneous vaginal delivery)     x 2  . Heart murmur     never had any problems as adult  . Bronchitis 2014  . Depression     no meds currently  . Anxiety     no meds currently  . GERD (gastroesophageal reflux disease)     PAST SURGICAL HISTORY: Past Surgical History  Procedure Laterality Date  . Tubal ligation  2001  . Endobronchial ultrasound Bilateral 12/02/2012    Procedure: ENDOBRONCHIAL ULTRASOUND;  Surgeon: Collene Gobble, MD;  Location: [facility];  Service: Cardiopulmonary;  Laterality: Bilateral;  . Portacath placement N/A 10/22/2013    Procedure: INSERTION PORT-A-CATH;  Surgeon: [name], MD;  Location: [facility];  Service: General;  Laterality: N/A;  . Lung  biopsy  2014  . Mastectomy w/ sentinel node biopsy Left 04/23/2014    & axillary LND  . Mastectomy w/ sentinel node biopsy Left 04/23/2014    Procedure: LEFT BREAST MASTECTOMY WITH SENTINEL LYMPH NODE BIOPSY AND AXILLARY LYMPH NODE DISECTION;  Surgeon: [name], MD;  Location: [facility];  Service: General;  Laterality: Left;  . Thyroid lobectomy Left 04/23/2014    Procedure: LEFT THYROID LOBECTOMY;  Surgeon: [name], MD;  Location: [facility];  Service: General;  Laterality: Left;  . Breast biopsy Left 2015 X 3    biopsy  . Wisdom tooth extraction    . Leep    . Laparoscopic assisted vaginal hysterectomy N/A 12/09/2014    Procedure: LAPAROSCOPIC ASSISTED VAGINAL HYSTERECTOMY;  Surgeon: Cheri Fowler, MD;  Location: [facility];  Service: Gynecology;  Laterality: N/A;  . Salpingoophorectomy Bilateral 12/09/2014    Procedure: SALPINGO OOPHORECTOMY;  Surgeon: Cheri Fowler, MD;  Location: [facility];  Service: Gynecology;  Laterality: Bilateral;  . Abdominal hysterectomy    .  Port-a-cath removal Left 02/03/2015    Procedure: REMOVAL PORT-A-CATH;  Surgeon: [name], MD;  Location: [facility];  Service: General;  Laterality: Left;    FAMILY HISTORY Family History  Problem Relation Age of Onset  . Cancer Paternal Aunt     "bone cancer"; deceased 27s  . Cancer Paternal Uncle     stomach cancer; deceased 87s  . Cancer Paternal Aunt     unk. primary; currently 17s  . Prostate cancer Maternal Grandfather 70  . Asthma Mother   The patient's parents are living, in fair mid to late 60s. The patient had one brother, no sisters. There is no history of breast or ovarian cancer in the family to her knowledge.   GYNECOLOGIC HISTORY:   (Reviewed 11/27/2013)  menarche age 8, first live birth at 67. She is GX P2. She was still having regular periods at the time of the start of her chemotherapy in May 2015.  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 She is  single and lives alone, although her mother is currently staying with her. Her son Michalla Ringer lives in [place] and works at [employer]. Son [name] lives in [place] where he works at a [employer]. The patient has 1 grandchild born in Sept 2015, by her son, [name]. She is a [place]: not in place; discussed again on 10/08/2015   HEALTH MAINTENANCE: (Updated 11/27/2013) Social History  Substance Use Topics  . Smoking status: Current Some Day Smoker -- 0.25 packs/day for 25 years    Types: Cigarettes  . Smokeless tobacco: Never Used     Comment: 04/23/2014 "using vapor; down to 3-4 cigarettes/day now"  . Alcohol Use: 6.0 oz/week    4 Cans of beer, 4 Shots of liquor, 2 Standard drinks or equivalent per week     Colonoscopy: Never  PAP: Not on file  Bone density: Not on file  Lipid panel:  Not on file  Allergies  Allergen Reactions  . Nyquil Multi-Symptom [Pseudoeph-Doxylamine-Dm-Apap] Itching and Other (See Comments)    Skin peels on hands, and feet    Current Outpatient Prescriptions  Medication Sig Dispense Refill  . ferrous sulfate 325 (65 FE) MG tablet Take 325 mg by mouth daily with breakfast.     . FLUoxetine (PROZAC) 20 MG tablet Take 20 mg by mouth every morning.    . gabapentin (NEURONTIN) 300 MG capsule Take one capsule in the morning, one afternoon, and two at bedtime 120 capsule 5  . HYDROmorphone (DILAUDID) 4 MG tablet Take 1 tablet (4 mg total) by mouth every 2 (two) hours as needed for severe pain. 180 tablet 0  . letrozole (FEMARA) 2.5 MG tablet Take 1 tablet (2.5 mg total) by mouth daily. 90 tablet 4  . metFORMIN (GLUCOPHAGE) 1000 MG tablet Take 1,000 mg by mouth 2 (two) times daily with a meal.     . methadone (DOLOPHINE) 5 MG tablet Take 3 tablets (15 mg total) by mouth every 8 (eight) hours. 90 tablet 0  . omeprazole (PRILOSEC) 40 MG capsule Take 1 capsule (40 mg total) by mouth daily. 90 capsule 12  .  palbociclib (IBRANCE) 100 MG capsule Take 1 capsule (100 mg total) by mouth daily with breakfast. Take whole with food. 21 capsule 2  . polyethylene glycol (MIRALAX / GLYCOLAX) packet Take 17 g by mouth daily. 14 each 0  . senna (SENOKOT) 8.6 MG TABS tablet Take 2  tablets (17.2 mg total) by mouth 2 (two) times daily. 120 each 0   No current facility-administered medications for this visit.    OBJECTIVE: Middle-aged [place] American woman who appears stated age  45 Vitals:   10/08/15 1504  BP: 114/73  Pulse: 101  Temp: 97.8 F (36.6 C)  Resp: 20     Body mass index is 32.02 kg/(m^2).    ECOG FS:2 - Symptomatic, <50% confined to bed  Sclerae unicteric, EOMs intact Oropharynx clear, dentition in poor repair No cervical or supraclavicular adenopathy Lungs no rales or rhonchi Heart regular rate and rhythm Abd soft, nontender, positive bowel sounds MSK no focal spinal tenderness, no upper extremity lymphedema Neuro: nonfocal, well oriented, appropriate affect Breasts: Deferred   LAB RESULTS:   Lab Results  Component Value Date   WBC 3.8* 10/01/2015   NEUTROABS 2.5 09/28/2015   HGB 9.9* 10/01/2015   HCT 30.1* 10/01/2015   MCV 90.7 10/01/2015   PLT 235 10/01/2015      Chemistry      Component Value Date/Time   NA 139 10/01/2015 0331   NA 137 09/15/2015 1509   K 5.0 10/01/2015 0331   K 4.3 09/15/2015 1509   CL 105 10/01/2015 0331   CO2 24 10/01/2015 0331   CO2 28 09/15/2015 1509   BUN 21* 10/01/2015 0331   BUN 21.1 09/15/2015 1509   CREATININE 1.07* 10/01/2015 0331   CREATININE 1.2* 09/15/2015 1509      Component Value Date/Time   CALCIUM 9.0 10/01/2015 0331   CALCIUM 9.4 09/15/2015 1509   ALKPHOS 170* 10/01/2015 0331   ALKPHOS 141 09/15/2015 1509   AST 24 10/01/2015 0331   AST 29 09/15/2015 1509   ALT 20 10/01/2015 0331   ALT 18 09/15/2015 1509   BILITOT 0.3 10/01/2015 0331   BILITOT <0.30 09/15/2015 1509      STUDIES: Dg Hips Bilat With Pelvis 3-4  Views  09/29/2015  CLINICAL DATA:  Bilateral hip pain for 2 months, no known injury, initial encounter EXAM: DG HIP (WITH OR WITHOUT PELVIS) 3-4V BILAT COMPARISON:  None. FINDINGS: Pelvic ring is intact. The hip joints show no acute fracture or dislocation. No gross soft tissue abnormality is noted. IMPRESSION: No acute abnormality noted. Electronically Signed   By: Inez Catalina M.D.   On: 09/29/2015 15:53    ASSESSMENT: 45 y.o. BRCA negative [place] woman with a history of sarcoidosis and breast cancer as follows:  (1)  status post left breast and left axillary lymph node biopsy 10/03/2013, both positive for a clinical T2 N2, stage IIIA invasive ductal carcinoma, grade 2, estrogen and progesterone receptor positive, HER-2 negative, with an MIB-1 of 20%.  (2) Treated with neoadjuvant chemotherapy consisting of doxorubicin and cyclophosphamide in dose dense fashion x4, completed 12/11/2013, followed by paclitaxel weekly x12 (dose reduced by 20% at cycle 5 because of neuropathy concerns) completed 03/19/2014.  (3) status post left mastectomy and left axillary lymph node dissection 04/23/2014 for a pT1c pN2, stage IIIA invasive ductal carcinoma, grade 3, estrogen receptor 99% positive, progesterone receptor 4% positive, with no HER-2 amplification  (3) adjuvant radiation completed 08/11/2014 Left chest wall / 50.4 Gray @ 1.8 Gray per fraction x 28 fractions Left Supraclavicular fossa / 45 Gray '@1'$ .8 Gray per fraction x 25 fractions Left PAB / 45 Gy at 1.8 Gray per fraction x 25 fractions Left scar / 10 Gray at [employer] per fraction x 5 fractions  (4) tamoxifen started 10/10/14, discontinued March 2017 with evidence  of metastatic recurrence  (5) current smoker. Attending quitting cessation therapy with the moviation that she must quit before she has a breast reduction.   (6) genetic testing sent 10/16/2013 did not reveal any deleterious mutation in any of these genes. The genes tested were ATM,  BARD1, BRCA1, BRCA2, BRIP1, CDH1, CHEK2, MRE11A, MUTYH, NBN, NF1, PALB2, PTEN, RAD50, RAD51C, RAD51D, and TP53.  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, no evidence of malignancy   (8) s/p hysterectomy and bilateral salpingo-oophorectomy 12/09/2014, with benign pathology  METASTATIC DISEASE: documented February 2017, with bone involvement, no lung or liver lesions (9) biopsy 08/13/2015 confirms metastatic breast cancer, estrogen receptor positive, progesterone receptor and HER-2 negative  (10) letrozole and palbociclib started 08/13/2015  (a) palbociclib dropped to 125m on 09/01/15  (11) palliative radiation 08/23/2015-09/06/2015: Pelvis, right shoulder and left proximal femur were all treated to 30 GY in 10 fractions at 3 Gy per fraction  (12) started zolendronate 09/01/2015   PLAN: JOralis still in a lot of pain, which she was taking the methadone wrong. She was taking 5 mg 3 times a day and of course that's what she was doing before she had the admission for poor pain control. At the hospital they told her to take 3 tablets 3 times a day, but has not with the bottles that so that confused her. 2 days ago her home health nurse corrected that so she has only been taking the 15 mg 3 times a day for the last 2 days.  Accordingly I think we need to continue on 15 mg 3 times a day of the methadone for 2 weeks. I gave her a new prescription today  (#270).  I also refilled her hydromorphone (#180) and she can take 2-4 mg every 2 hours as needed. Hopefully by the time she sees [place] on May 19 her pain will be a lipid little bit better control and she may be using the downloaded every 3 or 4 hours instead of every 2 hours.  She has a good bowel prophylaxis with daily MiraLAX and stool softeners.  In any case on the 5/19 visit we will consider increasing the methadone further to 20 mg 3 times a day and if we do that then I would switch her to the 10 mg  tablets.  I am also setting her up for Zometa on 10/22/2015. I'm hopeful this will accelerate the healing of the bone lesions.  I also started her on omeprazole 40 mg daily. I refilled her palbociclib which she will pick up today from the [facility]  She will see me again then on June 2. We will obtain a restaging CT scan just before that visit.  MChauncey Cruel MD 10/08/2015 3:48 PM

## 2015-10-11 ENCOUNTER — Encounter: Payer: Self-pay | Admitting: Nutrition

## 2015-10-11 NOTE — Progress Notes (Signed)
Received a message that patient requested additional Ensure. Attempted to contact patient by telephone but she did not answer and I was unable to leave a voicemail.

## 2015-10-15 ENCOUNTER — Telehealth: Payer: Self-pay | Admitting: *Deleted

## 2015-10-15 NOTE — Telephone Encounter (Signed)
Pt called to state she is having ongoing pain not controlled well with use of methadone 15 mg tid and dilaudid Q 2 hours. Nyree has OxyContin 10 mg in home from prior script and has been using intermittently ( 1 tablet a day ) with some benefit.  She now has 1 OxyContin left.  Above will be discussed with MD and pt contacted for best management.  Return call number given as [phone].

## 2015-10-19 ENCOUNTER — Telehealth: Payer: Self-pay | Admitting: *Deleted

## 2015-10-19 ENCOUNTER — Telehealth: Payer: Self-pay | Admitting: Oncology

## 2015-10-19 MED ORDER — POLYETHYLENE GLYCOL 3350 17 GM/SCOOP PO POWD: 17.0000 g | Freq: Every day | ORAL | Status: DC

## 2015-10-19 MED FILL — POLYETHYLENE GLYCOL 3350 PO: 31 days supply | Qty: 527 | Fill #0

## 2015-10-19 NOTE — Telephone Encounter (Signed)
Shelly Hall states she is still having breakthrough pain managed by dilaudid post increasing methadone on 5/12.  Current dose is 20 mg methadone q 8 hours with dilaudid 6 mg ( 1& 1/2 tabs ) every 2 hours.( she is having to use the dilaudid through out the day )  Per inquiry regarding pain concerns and management - Shelly Hall states she is most functional and without pain " usually in the afternoons- when I wake up and late at night are the worse"  Plan at present is she will add 10 mg methadone at 6 pm to current regimen- Shelly Hall stated understanding of new regimen of 4 tabs ( 20 mg ) methadone in am- then 6 hours later take 4 tabs then 6 hours later to take 2 tabs then at 6 hours to take 4 tabs-   She understands goal is to slowly increase the methadone to managed her pain - with not having to take dilaudid routinely - and allow her greatest function.  Shelly Hall will continue above regimen until she is seen Friday by HB/NP.  No refills of pain meds needed at this time.  She is having regular BMs since she started using miralax daily per hospital d/c.  Refill of miralax needed - sent to [facility] per pt's request.

## 2015-10-19 NOTE — Telephone Encounter (Signed)
pt called to confirm appt....done.Marland KitchenMarland KitchenMarland KitchenMarland Kitchenpt ok and aware of appts

## 2015-10-21 ENCOUNTER — Other Ambulatory Visit: Payer: Self-pay | Admitting: Nurse Practitioner

## 2015-10-22 ENCOUNTER — Ambulatory Visit (HOSPITAL_BASED_OUTPATIENT_CLINIC_OR_DEPARTMENT_OTHER): Payer: Medicaid Other | Admitting: Nurse Practitioner

## 2015-10-22 ENCOUNTER — Other Ambulatory Visit (HOSPITAL_BASED_OUTPATIENT_CLINIC_OR_DEPARTMENT_OTHER): Payer: Medicaid Other

## 2015-10-22 ENCOUNTER — Ambulatory Visit (HOSPITAL_BASED_OUTPATIENT_CLINIC_OR_DEPARTMENT_OTHER): Payer: Medicaid Other

## 2015-10-22 ENCOUNTER — Other Ambulatory Visit: Payer: Self-pay | Admitting: *Deleted

## 2015-10-22 VITALS — BP 111/66 | HR 73 | Temp 98.0°F | Resp 18

## 2015-10-22 DIAGNOSIS — G893 Neoplasm related pain (acute) (chronic): Secondary | ICD-10-CM

## 2015-10-22 DIAGNOSIS — C773 Secondary and unspecified malignant neoplasm of axilla and upper limb lymph nodes: Secondary | ICD-10-CM | POA: Diagnosis not present

## 2015-10-22 DIAGNOSIS — C50412 Malignant neoplasm of upper-outer quadrant of left female breast: Secondary | ICD-10-CM | POA: Diagnosis present

## 2015-10-22 DIAGNOSIS — C50912 Malignant neoplasm of unspecified site of left female breast: Secondary | ICD-10-CM

## 2015-10-22 DIAGNOSIS — C7951 Secondary malignant neoplasm of bone: Secondary | ICD-10-CM

## 2015-10-22 DIAGNOSIS — Z170 Estrogen receptor positive status [ER+]: Secondary | ICD-10-CM | POA: Diagnosis not present

## 2015-10-22 DIAGNOSIS — C799 Secondary malignant neoplasm of unspecified site: Secondary | ICD-10-CM

## 2015-10-22 LAB — CBC WITH DIFFERENTIAL/PLATELET
BASO%: 0.3 % (ref 0.0–2.0)
BASOS ABS: 0 10*3/uL (ref 0.0–0.1)
EOS%: 1.7 % (ref 0.0–7.0)
Eosinophils Absolute: 0.1 10*3/uL (ref 0.0–0.5)
HEMATOCRIT: 27.5 % — AB (ref 34.8–46.6)
HGB: 8.9 g/dL — ABNORMAL LOW (ref 11.6–15.9)
LYMPH#: 0.3 10*3/uL — AB (ref 0.9–3.3)
LYMPH%: 10.1 % — ABNORMAL LOW (ref 14.0–49.7)
MCH: 30 pg (ref 25.1–34.0)
MCHC: 32.4 g/dL (ref 31.5–36.0)
MCV: 92.5 fL (ref 79.5–101.0)
MONO#: 0.2 10*3/uL (ref 0.1–0.9)
MONO%: 7.6 % (ref 0.0–14.0)
NEUT#: 2.5 10*3/uL (ref 1.5–6.5)
NEUT%: 80.3 % — AB (ref 38.4–76.8)
Platelets: 209 10*3/uL (ref 145–400)
RBC: 2.97 10*6/uL — ABNORMAL LOW (ref 3.70–5.45)
RDW: 21.5 % — ABNORMAL HIGH (ref 11.2–14.5)
WBC: 3.1 10*3/uL — ABNORMAL LOW (ref 3.9–10.3)

## 2015-10-22 LAB — COMPREHENSIVE METABOLIC PANEL
ALT: 28 U/L (ref 0–55)
AST: 33 U/L (ref 5–34)
Albumin: 3.5 g/dL (ref 3.5–5.0)
Alkaline Phosphatase: 137 U/L (ref 40–150)
Anion Gap: 7 mEq/L (ref 3–11)
BUN: 18.3 mg/dL (ref 7.0–26.0)
CALCIUM: 9.6 mg/dL (ref 8.4–10.4)
CHLORIDE: 105 meq/L (ref 98–109)
CO2: 28 mEq/L (ref 22–29)
Creatinine: 1.4 mg/dL — ABNORMAL HIGH (ref 0.6–1.1)
EGFR: 51 mL/min/{1.73_m2} — ABNORMAL LOW (ref >90)
Glucose: 100 mg/dl (ref 70–140)
POTASSIUM: 4.8 meq/L (ref 3.5–5.1)
Sodium: 140 mEq/L (ref 136–145)
Total Bilirubin: <0.3 mg/dL (ref 0.20–1.20)
Total Protein: 7.3 g/dL (ref 6.4–8.3)

## 2015-10-22 MED ORDER — SODIUM CHLORIDE 0.9 % IV SOLN
Freq: Once | INTRAVENOUS | Status: AC
  Administered 2015-10-22: 15:00:00 via INTRAVENOUS

## 2015-10-22 MED ORDER — HYDROMORPHONE HCL 4 MG PO TABS: 4.0000 mg | ORAL_TABLET | ORAL | Status: DC | PRN

## 2015-10-22 MED ORDER — ZOLEDRONIC ACID 4 MG/100ML IV SOLN
4.0000 mg | Freq: Once | INTRAVENOUS | Status: AC
  Administered 2015-10-22: 4 mg via INTRAVENOUS
  Filled 2015-10-22: qty 100

## 2015-10-22 MED FILL — HYDROmorphone HCL 4 MG TABS: 4 | 15 days supply | Qty: 180 | Fill #0

## 2015-10-22 NOTE — Progress Notes (Signed)
[place]  [phone] [phone]    ID: Shelly Hall OB: July 02, 1970  MR#: [id number]  OQH#:[id number]  PCP: Elyn Peers, MD GYN:  [name] SU: [name] OTHER MD: Thea Silversmith  CHIEF COMPLAINT:  Left Breast Cancer, estrogen receptor positive  CURRENT TREATMENT: letrozole, palbociclib, zolendronate  BREAST CANCER HISTORY: From the original intake note:  Shelly Hall palpated a mass in her left breast mid-April 2015 and immediately brought it to Dr. Benjie Karvonen' attention. She was set up for bilateral screening mammography with tomography at [facility] 09/19/2013. The possible distortion in the left breast and a nearby group of calcifications was felt to warrant further evaluation, and on 09/30/2013 she underwent unilateral left diagnostic mammography and ultrasonography. There was an irregular mass in the outer left breast associated with microcalcifications. 4 cm anterior to this there was another cluster of pleomorphic calcifications spanning 7 mm. He had more anteriorly there was an irregular mass associated with other calcifications. In total the abnormalities in the left breast spent approximately 10 cm, and is segmental distribution. On exam there was a palpable firm mass at the 2:30 o'clock position of the left breast 10 cm from the nipple. There was palpable adenopathy in the inferior left axilla. Ultrasound confirmed an irregular hypoechoic mass in the left breast measuring 4.6 cm. In addition, a 1.4 cm oval slightly irregular mass was noted at the 3:00 position and yet another mass measured 9 mm in the same area. Ultrasound of the left axilla showed a dominant 3.6 cm lymph node.  Biopsy of 2 of the left breast masses and the suspicious left breast lymph node on 10/03/2013 showed ([id number]) an invasive ductal carcinoma, grade 2, estrogen receptor 90% positive with strong staining intensity, progesterone receptor 40% positive but moderate  staining intensity, with an MIB-1 of 20% and no HER-2 amplification. (Because all 3 biopsies were morphologically identical, only one prognostic panel was sent).  On 10/10/2013 the patient underwent bilateral breast MRI. This showed numerous enhancing masses in the left breast, the largest measuring 3.5 cm, and the aggregate measuring 12.7 cm. There were numerous enlarged left axillary lymph nodes, at least 5 of which were enlarged, both at levels 1 and 2. The largest measured 2.3 cm. The right breast was negative.  The patient's subsequent history is as detailed below.  INTERVAL HISTORY: Shelly Hall returns today for follow-up of her metastatic estrogen receptor positive breast cancer, accompanied by her friend, Melody. She continues today is day 6 of her current cycle of 149m palbociclib. She takes this along with letrozole daily. She tolerates both drugs well. Her hot flashes have improved. Shelly Hall's main concern has been pain control. She has recently increased her methadone use to 230mat 6am, 12pm, and 3084mt 6pm. She is donw use using just 6mg18m dilaudid every 2 hours instead of 8mg.70mr pain can be as good as a 2/10 after she has had a few doses in the morning. Of course the morning is when her pain is the worst. Some days she is not able to make it out of the bed for several house. The pain medicine slightly constipates her, but she regularly takes stool softeners and miralax daily. Her pain is in her hips and legs.  REVIEW OF SYSTEMS: Shelly Hall denies fever or chills. She is less nauseous these days, but her appetite is down secondary to taste changes. She is consuming Ensure for a supplement. She denies fevers, chills, shortness of breath, chest pain, cough,  or palpitations. She has occasional headaches, but denies dizziness, or vision changes. A detailed review of systems is otherwise stable. \  PAST MEDICAL HISTORY: Past Medical History  Diagnosis Date  . Hypertension   . Sarcoidosis ([place])      no problems per patient   . Anemia   . Complication of anesthesia     makes pt. restless and unable to be still  . High cholesterol   . Thyroid cancer ([place]) 2015  . Type II diabetes mellitus ([place])   . Breast cancer ([place]) 2015    left upper outer;  had chemo  . Radiation 06/23/14-08/11/14    Invasive ductal ca o left breast  . SVD (spontaneous vaginal delivery)     x 2  . Heart murmur     never had any problems as adult  . Bronchitis 2014  . Depression     no meds currently  . Anxiety     no meds currently  . GERD (gastroesophageal reflux disease)     PAST SURGICAL HISTORY: Past Surgical History  Procedure Laterality Date  . Tubal ligation  2001  . Endobronchial ultrasound Bilateral 12/02/2012    Procedure: ENDOBRONCHIAL ULTRASOUND;  Surgeon: Collene Gobble, MD;  Location: [facility];  Service: Cardiopulmonary;  Laterality: Bilateral;  . Portacath placement N/A 10/22/2013    Procedure: INSERTION PORT-A-CATH;  Surgeon: [name], MD;  Location: [facility];  Service: General;  Laterality: N/A;  . Lung biopsy  2014  . Mastectomy w/ sentinel node biopsy Left 04/23/2014    & axillary LND  . Mastectomy w/ sentinel node biopsy Left 04/23/2014    Procedure: LEFT BREAST MASTECTOMY WITH SENTINEL LYMPH NODE BIOPSY AND AXILLARY LYMPH NODE DISECTION;  Surgeon: [name], MD;  Location: [place];  Service: General;  Laterality: Left;  . Thyroid lobectomy Left 04/23/2014    Procedure: LEFT THYROID LOBECTOMY;  Surgeon: [name], MD;  Location: [place];  Service: General;  Laterality: Left;  . Breast biopsy Left 2015 X 3    biopsy  . Wisdom tooth extraction    . Leep    . Laparoscopic assisted vaginal hysterectomy N/A 12/09/2014    Procedure: LAPAROSCOPIC ASSISTED VAGINAL HYSTERECTOMY;  Surgeon: Cheri Fowler, MD;  Location: [facility];  Service: Gynecology;  Laterality: N/A;  . Salpingoophorectomy Bilateral 12/09/2014    Procedure: SALPINGO OOPHORECTOMY;  Surgeon: Cheri Fowler, MD;   Location: [facility];  Service: Gynecology;  Laterality: Bilateral;  . Abdominal hysterectomy    . Port-a-cath removal Left 02/03/2015    Procedure: REMOVAL PORT-A-CATH;  Surgeon: [name], MD;  Location: [facility];  Service: General;  Laterality: Left;    FAMILY HISTORY Family History  Problem Relation Age of Onset  . Cancer Paternal Aunt     "bone cancer"; deceased 12s  . Cancer Paternal Uncle     stomach cancer; deceased 24s  . Cancer Paternal Aunt     unk. primary; currently 46s  . Prostate cancer Maternal Grandfather 49  . Asthma Mother   The patient's parents are living, in fair mid to late 95s. The patient had one brother, no sisters. There is no history of breast or ovarian cancer in the family to her knowledge.   GYNECOLOGIC HISTORY:   (Reviewed 11/27/2013)  menarche age 40, first live birth at 6. She is GX P2. She was still having regular periods at the time of the start of her chemotherapy in May 2015.  SOCIAL HISTORY:   (Reviewed 11/27/2013) [place]  works as a Sports coach for [employer]. She is single and lives alone, although her mother is currently staying with her. Her son Symphany Fleissner lives in [place] and works at [employer]. Son [name] lives in [place] where he works at a [employer]. The patient has 1 grandchild born in Sept 2015, by her son, [name]. She is a Clifford: not in place; discussed again on 10/08/2015   HEALTH MAINTENANCE: (Updated 11/27/2013) Social History  Substance Use Topics  . Smoking status: Current Some Day Smoker -- 0.25 packs/day for 25 years    Types: Cigarettes  . Smokeless tobacco: Never Used     Comment: 04/23/2014 "using vapor; down to 3-4 cigarettes/day now"  . Alcohol Use: 6.0 oz/week    4 Cans of beer, 4 Shots of liquor, 2 Standard drinks or equivalent per week     Colonoscopy: Never  PAP: Not on file  Bone density: Not on file  Lipid panel:  Not on  file  Allergies  Allergen Reactions  . Nyquil Multi-Symptom [Pseudoeph-Doxylamine-Dm-Apap] Itching and Other (See Comments)    Skin peels on hands, and feet    Current Outpatient Prescriptions  Medication Sig Dispense Refill  . ferrous sulfate 325 (65 FE) MG tablet Take 325 mg by mouth daily with breakfast.     . FLUoxetine (PROZAC) 20 MG tablet Take 20 mg by mouth every morning.    . gabapentin (NEURONTIN) 300 MG capsule Take one capsule in the morning, one afternoon, and two at bedtime 120 capsule 5  . letrozole (FEMARA) 2.5 MG tablet Take 1 tablet (2.5 mg total) by mouth daily. 90 tablet 4  . metFORMIN (GLUCOPHAGE) 1000 MG tablet Take 1,000 mg by mouth 2 (two) times daily with a meal.     . methadone (DOLOPHINE) 5 MG tablet Take 3 tablets (15 mg total) by mouth every 8 (eight) hours. 270 tablet 0  . omeprazole (PRILOSEC) 40 MG capsule Take 1 capsule (40 mg total) by mouth daily. 90 capsule 12  . palbociclib (IBRANCE) 100 MG capsule Take 1 capsule (100 mg total) by mouth daily with breakfast. Take whole with food. 21 capsule 2  . polyethylene glycol powder (GLYCOLAX/MIRALAX) powder Take 17 g by mouth daily. 500 g 3  . senna (SENOKOT) 8.6 MG TABS tablet Take 2 tablets (17.2 mg total) by mouth 2 (two) times daily. 120 each 0  . dexamethasone (DECADRON) 4 MG tablet Reported on 10/22/2015  0  . HYDROmorphone (DILAUDID) 4 MG tablet Take 1 tablet (4 mg total) by mouth every 2 (two) hours as needed for severe pain. 180 tablet 0   No current facility-administered medications for this visit.   Facility-Administered Medications Ordered in Other Visits  Medication Dose Route Frequency Provider Last Rate Last Dose  . 0.9 %  sodium chloride infusion   Intravenous Once Laurie Panda, NP      . zolendronic acid (ZOMETA) 4 mg in sodium chloride 0.9 % 100 mL IVPB  4 mg Intravenous Once Chauncey Cruel, MD        OBJECTIVE: Middle-aged [place] American woman who appears stated age  There were no  vitals filed for this visit.   There is no weight on file to calculate BMI.    ECOG FS:2 - Symptomatic, <50% confined to bed   Skin: warm, dry  HEENT: sclerae anicteric, conjunctivae pink, oropharynx clear. No thrush or mucositis.  Lymph Nodes: No cervical or supraclavicular lymphadenopathy  Lungs: clear to auscultation  bilaterally, no rales, wheezes, or rhonci  Heart: regular rate and rhythm  Abdomen: round, soft, non tender, positive bowel sounds  Musculoskeletal: No focal spinal tenderness, no peripheral edema  Neuro: non focal, well oriented, positive affect  Breasts: deferred   LAB RESULTS:   Lab Results  Component Value Date   WBC 3.1* 10/22/2015   NEUTROABS 2.5 10/22/2015   HGB 8.9* 10/22/2015   HCT 27.5* 10/22/2015   MCV 92.5 10/22/2015   PLT 209 10/22/2015      Chemistry      Component Value Date/Time   NA 140 10/22/2015 1353   NA 139 10/01/2015 0331   K 4.8 10/22/2015 1353   K 5.0 10/01/2015 0331   CL 105 10/01/2015 0331   CO2 28 10/22/2015 1353   CO2 24 10/01/2015 0331   BUN 18.3 10/22/2015 1353   BUN 21* 10/01/2015 0331   CREATININE 1.4* 10/22/2015 1353   CREATININE 1.07* 10/01/2015 0331      Component Value Date/Time   CALCIUM 9.6 10/22/2015 1353   CALCIUM 9.0 10/01/2015 0331   ALKPHOS 137 10/22/2015 1353   ALKPHOS 170* 10/01/2015 0331   AST 33 10/22/2015 1353   AST 24 10/01/2015 0331   ALT 28 10/22/2015 1353   ALT 20 10/01/2015 0331   BILITOT <0.30 10/22/2015 1353   BILITOT 0.3 10/01/2015 0331      STUDIES: Dg Hips Bilat With Pelvis 3-4 Views  09/29/2015  CLINICAL DATA:  Bilateral hip pain for 2 months, no known injury, initial encounter EXAM: DG HIP (WITH OR WITHOUT PELVIS) 3-4V BILAT COMPARISON:  None. FINDINGS: Pelvic ring is intact. The hip joints show no acute fracture or dislocation. No gross soft tissue abnormality is noted. IMPRESSION: No acute abnormality noted. Electronically Signed   By: Inez Catalina M.D.   On: 09/29/2015 15:53     ASSESSMENT: 45 y.o. BRCA negative [place] woman with a history of sarcoidosis and breast cancer as follows:  (1)  status post left breast and left axillary lymph node biopsy 10/03/2013, both positive for a clinical T2 N2, stage IIIA invasive ductal carcinoma, grade 2, estrogen and progesterone receptor positive, HER-2 negative, with an MIB-1 of 20%.  (2) Treated with neoadjuvant chemotherapy consisting of doxorubicin and cyclophosphamide in dose dense fashion x4, completed 12/11/2013, followed by paclitaxel weekly x12 (dose reduced by 20% at cycle 5 because of neuropathy concerns) completed 03/19/2014.  (3) status post left mastectomy and left axillary lymph node dissection 04/23/2014 for a pT1c pN2, stage IIIA invasive ductal carcinoma, grade 3, estrogen receptor 99% positive, progesterone receptor 4% positive, with no HER-2 amplification  (3) adjuvant radiation completed 08/11/2014 Left chest wall / 50.4 [place] @ 1.8 [place] per fraction x 28 fractions Left Supraclavicular fossa / 45 [place] '@1'$ .8 [place] per fraction x 25 fractions Left PAB / 45 Gy at 1.8 [place] per fraction x 25 fractions Left scar / 10 [place] at [employer] per fraction x 5 fractions  (4) tamoxifen started 10/10/14, discontinued March 2017 with evidence of metastatic recurrence  (5) current smoker. Attending quitting cessation therapy with the moviation that she must quit before she has a breast reduction.   (6) genetic testing sent 10/16/2013 did not reveal any deleterious mutation in any of these genes. The genes tested were ATM, BARD1, BRCA1, BRCA2, BRIP1, CDH1, CHEK2, MRE11A, MUTYH, NBN, NF1, PALB2, PTEN, RAD50, RAD51C, RAD51D, and TP53.  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, no evidence of malignancy   (8) s/p hysterectomy  and bilateral salpingo-oophorectomy 12/09/2014, with benign pathology  METASTATIC DISEASE: documented February 2017, with bone involvement, no lung or liver lesions (9)  biopsy 08/13/2015 confirms metastatic breast cancer, estrogen receptor positive, progesterone receptor and HER-2 negative  (10) letrozole and palbociclib started 08/13/2015  (a) palbociclib dropped to 194m on 09/01/15  (11) palliative radiation 08/23/2015-09/06/2015: Pelvis, right shoulder and left proximal femur were all treated to 30 GY in 10 fractions at 3 Gy per fraction  (12) started zolendronate 09/01/2015   PLAN: JAntoninais managing greater pain control, and hopefully this continues to improve over the weekend. She is tolerating the palbociclib well. The labs were reviewed in detail and her [place] at 2.3. She will proceed with her next dose of zometa this after noon.   I have placed orders for a bone scan to be performed in the last week of May.   JDmyawill return in 2 week for follow up with Dr. MJana Hakim During this visit they will review the results of her scan. She understands and agrees with this plan. She knows the goal of treatment in her case is control. She has been encouraged to call with any issues that might arise before her next visit here.   HLaurie Panda NP 10/22/2015 3:28 PM

## 2015-10-22 NOTE — Patient Instructions (Signed)

## 2015-10-25 ENCOUNTER — Encounter: Payer: Self-pay | Admitting: Nurse Practitioner

## 2015-11-03 ENCOUNTER — Ambulatory Visit (HOSPITAL_COMMUNITY): Payer: Medicaid Other

## 2015-11-03 ENCOUNTER — Encounter (HOSPITAL_COMMUNITY): Payer: Medicaid Other

## 2015-11-04 DIAGNOSIS — C50412 Malignant neoplasm of upper-outer quadrant of left female breast: Secondary | ICD-10-CM | POA: Diagnosis not present

## 2015-11-05 ENCOUNTER — Ambulatory Visit (HOSPITAL_BASED_OUTPATIENT_CLINIC_OR_DEPARTMENT_OTHER): Payer: Medicare Other | Admitting: Oncology

## 2015-11-05 ENCOUNTER — Other Ambulatory Visit (HOSPITAL_BASED_OUTPATIENT_CLINIC_OR_DEPARTMENT_OTHER): Payer: Medicare Other

## 2015-11-05 ENCOUNTER — Telehealth: Payer: Self-pay | Admitting: Oncology

## 2015-11-05 VITALS — BP 118/71 | HR 90 | Temp 98.2°F | Resp 18 | Ht 63.0 in | Wt 178.5 lb

## 2015-11-05 DIAGNOSIS — D869 Sarcoidosis, unspecified: Secondary | ICD-10-CM

## 2015-11-05 DIAGNOSIS — Z720 Tobacco use: Secondary | ICD-10-CM

## 2015-11-05 DIAGNOSIS — C7951 Secondary malignant neoplasm of bone: Secondary | ICD-10-CM | POA: Diagnosis not present

## 2015-11-05 DIAGNOSIS — Z170 Estrogen receptor positive status [ER+]: Secondary | ICD-10-CM

## 2015-11-05 DIAGNOSIS — C50412 Malignant neoplasm of upper-outer quadrant of left female breast: Secondary | ICD-10-CM

## 2015-11-05 DIAGNOSIS — G893 Neoplasm related pain (acute) (chronic): Secondary | ICD-10-CM

## 2015-11-05 DIAGNOSIS — C50912 Malignant neoplasm of unspecified site of left female breast: Secondary | ICD-10-CM

## 2015-11-05 DIAGNOSIS — C773 Secondary and unspecified malignant neoplasm of axilla and upper limb lymph nodes: Secondary | ICD-10-CM | POA: Diagnosis not present

## 2015-11-05 DIAGNOSIS — E119 Type 2 diabetes mellitus without complications: Secondary | ICD-10-CM

## 2015-11-05 LAB — CBC WITH DIFFERENTIAL/PLATELET
BASO%: 0 % (ref 0.0–2.0)
BASOS ABS: 0 10*3/uL (ref 0.0–0.1)
EOS%: 2.7 % (ref 0.0–7.0)
Eosinophils Absolute: 0.1 10*3/uL (ref 0.0–0.5)
HEMATOCRIT: 29.7 % — AB (ref 34.8–46.6)
HGB: 9.7 g/dL — ABNORMAL LOW (ref 11.6–15.9)
LYMPH#: 0.4 10*3/uL — AB (ref 0.9–3.3)
LYMPH%: 13.7 % — ABNORMAL LOW (ref 14.0–49.7)
MCH: 30.1 pg (ref 25.1–34.0)
MCHC: 32.7 g/dL (ref 31.5–36.0)
MCV: 92.2 fL (ref 79.5–101.0)
MONO#: 0.3 10*3/uL (ref 0.1–0.9)
MONO%: 11.3 % (ref 0.0–14.0)
NEUT#: 1.9 10*3/uL (ref 1.5–6.5)
NEUT%: 72.3 % (ref 38.4–76.8)
PLATELETS: 178 10*3/uL (ref 145–400)
RBC: 3.22 10*6/uL — ABNORMAL LOW (ref 3.70–5.45)
RDW: 19.9 % — ABNORMAL HIGH (ref 11.2–14.5)
WBC: 2.6 10*3/uL — ABNORMAL LOW (ref 3.9–10.3)

## 2015-11-05 LAB — COMPREHENSIVE METABOLIC PANEL
ALK PHOS: 169 U/L — AB (ref 40–150)
ALT: 28 U/L (ref 0–55)
ANION GAP: 9 meq/L (ref 3–11)
AST: 40 U/L — ABNORMAL HIGH (ref 5–34)
Albumin: 3.6 g/dL (ref 3.5–5.0)
BILIRUBIN TOTAL: 0.33 mg/dL (ref 0.20–1.20)
BUN: 11 mg/dL (ref 7.0–26.0)
CALCIUM: 8.9 mg/dL (ref 8.4–10.4)
CHLORIDE: 105 meq/L (ref 98–109)
CO2: 23 mEq/L (ref 22–29)
CREATININE: 1.3 mg/dL — AB (ref 0.6–1.1)
EGFR: 58 mL/min/{1.73_m2} — ABNORMAL LOW (ref >90)
Glucose: 106 mg/dl (ref 70–140)
Potassium: 4 mEq/L (ref 3.5–5.1)
Sodium: 137 mEq/L (ref 136–145)
Total Protein: 7.8 g/dL (ref 6.4–8.3)

## 2015-11-05 MED ORDER — METHADONE HCL 10 MG PO TABS: 20.0000 mg | ORAL_TABLET | Freq: Four times a day (QID) | ORAL | Status: DC

## 2015-11-05 MED ORDER — HYDROMORPHONE HCL 4 MG PO TABS: 4.0000 mg | ORAL_TABLET | ORAL | Status: DC | PRN

## 2015-11-05 MED FILL — METHADONE HCL 10 MG TABLET: 10 | 10 days supply | Qty: 120 | Fill #0

## 2015-11-05 MED FILL — HYDROmorphone HCL 4 MG TABS: 4 | 15 days supply | Qty: 180 | Fill #0

## 2015-11-05 NOTE — Progress Notes (Signed)
[place]  Telephone:([phone]) [phone] Fax:(336) [phone]    ID: Shelly Hall OB: 1971-03-21  MR#: [id number]  OVA#:[id number]  PCP: Shelly Peers, MD GYN:  Shelly Hall SU: Shelly Hall OTHER MD: Shelly Hall  CHIEF COMPLAINT:  Left Breast Cancer, estrogen receptor positive  CURRENT TREATMENT: letrozole, palbociclib, zolendronate  BREAST CANCER HISTORY: From the original intake note:  Shelly Hall palpated a mass in her left breast mid-April 2015 and immediately brought it to Dr. Benjie Hall' attention. She was set up for bilateral screening mammography with tomography at [facility] 09/19/2013. The possible distortion in the left breast and a nearby group of calcifications was felt to warrant further evaluation, and on 09/30/2013 she underwent unilateral left diagnostic mammography and ultrasonography. There was an irregular mass in the outer left breast associated with microcalcifications. 4 cm anterior to this there was another cluster of pleomorphic calcifications spanning 7 mm. He had more anteriorly there was an irregular mass associated with other calcifications. In total the abnormalities in the left breast spent approximately 10 cm, and is segmental distribution. On exam there was a palpable firm mass at the 2:30 o'clock position of the left breast 10 cm from the nipple. There was palpable adenopathy in the inferior left axilla. Ultrasound confirmed an irregular hypoechoic mass in the left breast measuring 4.6 cm. In addition, a 1.4 cm oval slightly irregular mass was noted at the 3:00 position and yet another mass measured 9 mm in the same area. Ultrasound of the left axilla showed a dominant 3.6 cm lymph node.  Biopsy of 2 of the left breast masses and the suspicious left breast lymph node on 10/03/2013 showed ([id number]) an invasive ductal carcinoma, grade 2, estrogen receptor 90% positive with strong staining intensity, progesterone receptor 40% positive but moderate  staining intensity, with an MIB-1 of 20% and no HER-2 amplification. (Because all 3 biopsies were morphologically identical, only one prognostic panel was sent).  On 10/10/2013 the patient underwent bilateral breast MRI. This showed numerous enhancing masses in the left breast, the largest measuring 3.5 cm, and the aggregate measuring 12.7 cm. There were numerous enlarged left axillary lymph nodes, at least 5 of which were enlarged, both at levels 1 and 2. The largest measured 2.3 cm. The right breast was negative.  The patient's subsequent history is as detailed below.  INTERVAL HISTORY: Shelly Hall returns today for follow-up of her  Stage IV breast cancer.  She is completing her second week of the current cycle of palbociclib. She tolerates it well. She has no nauseaandno increased fatigue that she has noticed. She is losing her hair to some extent and wonders if that can be related. He is of course also on letrozole. Her hot flashes fortunately have pretty much resolved.   Her pain is much better controlled on her current regimen, which consists ofmethadone 20 mg when she wakes up in the morning, 20 mg at noon, 10 mg in the middle of the afternoon, and 20 mg at bedtime. Despite this she still needs time on it pretty much every 2 hours or so.  Her bowel prophylaxis is working well and she has a bowel movement most days. They are not hard.  He was supposed to have had a bone scanrepeated before today's visit that she did not feel good that day and reschedule it for next week  REVIEW OF SYSTEMS: Shelly Hall  Would like to try but she understands while on narcotics this is really not recommended. She continues fatigued. This  is very variable. For the last day or 2 she has spent a little bit more time in bed. Other days she is able to go about them do more. She has had some increased pain in the left Hall, including the left leg in the left arm, for the last day or so. Sometimes she has blurred vision. Her  appetite is poor. Things don't taste well. She has stress urinary incontinence which  Is not a new symptom.She says her blood sugars are doing well. Overall she rates her current status as "pretty good". A detailed review of systems was otherwise noncontributory \  PAST MEDICAL HISTORY: Past Medical History  Diagnosis Date  . Hypertension   . Sarcoidosis (Shelly Hall)     no problems per patient   . Anemia   . Complication of anesthesia     makes pt. restless and unable to be still  . High cholesterol   . Thyroid cancer (Shelly Hall) 2015  . Type II diabetes mellitus ([place])   . Breast cancer (Shelly Hall) 2015    left upper outer;  had chemo  . Radiation 06/23/14-08/11/14    Invasive ductal ca o left breast  . SVD (spontaneous vaginal delivery)     x 2  . Heart murmur     never had any problems as adult  . Bronchitis 2014  . Depression     no meds currently  . Anxiety     no meds currently  . GERD (gastroesophageal reflux disease)     PAST SURGICAL HISTORY: Past Surgical History  Procedure Laterality Date  . Tubal ligation  2001  . Endobronchial ultrasound Bilateral 12/02/2012    Procedure: ENDOBRONCHIAL ULTRASOUND;  Surgeon: Collene Gobble, MD;  Location: [facility];  Service: Cardiopulmonary;  Laterality: Bilateral;  . Portacath placement N/A 10/22/2013    Procedure: INSERTION PORT-A-CATH;  Surgeon: Shelly Klein, MD;  Location: [place];  Service: General;  Laterality: N/A;  . Lung biopsy  2014  . Mastectomy w/ sentinel node biopsy Left 04/23/2014    & axillary LND  . Mastectomy w/ sentinel node biopsy Left 04/23/2014    Procedure: LEFT BREAST MASTECTOMY WITH SENTINEL LYMPH NODE BIOPSY AND AXILLARY LYMPH NODE DISECTION;  Surgeon: Shelly Klein, MD;  Location: [place];  Service: General;  Laterality: Left;  . Thyroid lobectomy Left 04/23/2014    Procedure: LEFT THYROID LOBECTOMY;  Surgeon: Shelly Klein, MD;  Location: [place];  Service: General;  Laterality: Left;  . Breast biopsy Left 2015 X 3    biopsy   . Wisdom tooth extraction    . Leep    . Laparoscopic assisted vaginal hysterectomy N/A 12/09/2014    Procedure: LAPAROSCOPIC ASSISTED VAGINAL HYSTERECTOMY;  Surgeon: Cheri Fowler, MD;  Location: [facility];  Service: Gynecology;  Laterality: N/A;  . Salpingoophorectomy Bilateral 12/09/2014    Procedure: SALPINGO OOPHORECTOMY;  Surgeon: Cheri Fowler, MD;  Location: [facility];  Service: Gynecology;  Laterality: Bilateral;  . Abdominal hysterectomy    . Port-a-cath removal Left 02/03/2015    Procedure: REMOVAL PORT-A-CATH;  Surgeon: Shelly Klein, MD;  Location: [facility];  Service: General;  Laterality: Left;    FAMILY HISTORY Family History  Problem Relation Age of Onset  . Cancer Paternal Aunt     "bone cancer"; deceased 28s  . Cancer Paternal Uncle     stomach cancer; deceased 69s  . Cancer Paternal Aunt     unk. primary; currently 3s  . Prostate cancer Maternal Grandfather 96  . Asthma  Mother   The patient's parents are living, in fair mid to late 64s. The patient had one brother, no sisters. There is no history of breast or ovarian cancer in the family to her knowledge.   GYNECOLOGIC HISTORY:   (Reviewed 11/27/2013)  menarche age 10, first live birth at 66. She is GX P2. She was still having regular periods at the time of the start of her chemotherapy in May 2015.  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 She is single and lives alone, although her mother is currently staying with her. Her son Sawsan Riggio lives in [place] and works at [employer]. Son [name] lives in [place] where he works at a [employer]. The patient has 1 grandchild born in Sept 2015, by her son, [name]. She is a [place]: not in place; discussed again on 10/08/2015   HEALTH MAINTENANCE: (Updated 11/27/2013) Social History  Substance Use Topics  . Smoking status: Current Some Day Smoker -- 0.25  packs/day for 25 years    Types: Cigarettes  . Smokeless tobacco: Never Used     Comment: 04/23/2014 "using vapor; down to 3-4 cigarettes/day now"  . Alcohol Use: 6.0 oz/week    4 Cans of beer, 4 Shots of liquor, 2 Standard drinks or equivalent per week     Colonoscopy: Never  PAP: Not on file  Bone density: Not on file  Lipid panel:  Not on file  Allergies  Allergen Reactions  . Nyquil Multi-Symptom [Pseudoeph-Doxylamine-Dm-Apap] Itching and Other (See Comments)    Skin peels on hands, and feet    Current Outpatient Prescriptions  Medication Sig Dispense Refill  . dexamethasone (DECADRON) 4 MG tablet Reported on 10/22/2015  0  . ferrous sulfate 325 (65 FE) MG tablet Take 325 mg by mouth daily with breakfast.     . FLUoxetine (PROZAC) 20 MG tablet Take 20 mg by mouth every morning.    . gabapentin (NEURONTIN) 300 MG capsule Take one capsule in the morning, one afternoon, and two at bedtime 120 capsule 5  . HYDROmorphone (DILAUDID) 4 MG tablet Take 1 tablet (4 mg total) by mouth every 2 (two) hours as needed for Shelly pain. 180 tablet 0  . letrozole (FEMARA) 2.5 MG tablet Take 1 tablet (2.5 mg total) by mouth daily. 90 tablet 4  . metFORMIN (GLUCOPHAGE) 1000 MG tablet Take 1,000 mg by mouth 2 (two) times daily with a meal.     . methadone (DOLOPHINE) 5 MG tablet Take 3 tablets (15 mg total) by mouth every 8 (eight) hours. 270 tablet 0  . omeprazole (PRILOSEC) 40 MG capsule Take 1 capsule (40 mg total) by mouth daily. 90 capsule 12  . palbociclib (IBRANCE) 100 MG capsule Take 1 capsule (100 mg total) by mouth daily with breakfast. Take whole with food. 21 capsule 2  . polyethylene glycol powder (GLYCOLAX/MIRALAX) powder Take 17 g by mouth daily. 500 g 3  . senna (SENOKOT) 8.6 MG TABS tablet Take 2 tablets (17.2 mg total) by mouth 2 (two) times daily. 120 each 0   No current facility-administered medications for this visit.    OBJECTIVE: Middle-aged [place] American woman walking  with a cane Filed Vitals:   11/05/15 1351  BP: 118/71  Pulse: 90  Temp: 98.2 F (36.8 C)  Resp: 18     Body mass index is 31.63 kg/(m^2).    ECOG FS:2 - Symptomatic, <50% confined to bed  Sclerae unicteric, pupils round and equal Oropharynx clear and moist-- no thrush or other lesions No cervical or supraclavicular adenopathy Lungs no rales or rhonchi Heart regular rate and rhythm Abd soft, nontender, positive bowel sounds MSK no focal spinal tendernessto moderate palpation Neuro: nonfocal, bilateral dorsiflexion 5 over 5 well oriented, appropriate affect Breasts: deferred     LAB RESULTS:   Lab Results  Component Value Date   WBC 2.6* 11/05/2015   NEUTROABS 1.9 11/05/2015   HGB 9.7* 11/05/2015   HCT 29.7* 11/05/2015   MCV 92.2 11/05/2015   PLT 178 11/05/2015      Chemistry      Component Value Date/Time   NA 140 10/22/2015 1353   NA 139 10/01/2015 0331   K 4.8 10/22/2015 1353   K 5.0 10/01/2015 0331   CL 105 10/01/2015 0331   CO2 28 10/22/2015 1353   CO2 24 10/01/2015 0331   BUN 18.3 10/22/2015 1353   BUN 21* 10/01/2015 0331   CREATININE 1.4* 10/22/2015 1353   CREATININE 1.07* 10/01/2015 0331      Component Value Date/Time   CALCIUM 9.6 10/22/2015 1353   CALCIUM 9.0 10/01/2015 0331   ALKPHOS 137 10/22/2015 1353   ALKPHOS 170* 10/01/2015 0331   AST 33 10/22/2015 1353   AST 24 10/01/2015 0331   ALT 28 10/22/2015 1353   ALT 20 10/01/2015 0331   BILITOT <0.30 10/22/2015 1353   BILITOT 0.3 10/01/2015 0331      STUDIES: No results found.  ASSESSMENT: 45 y.o. BRCA negative [place] woman with a history of sarcoidosis and breast cancer as follows:  (1)  status post left breast and left axillary lymph node biopsy 10/03/2013, both positive for a clinical T2 N2, stage IIIA invasive ductal carcinoma, grade 2, estrogen and progesterone receptor positive, HER-2 negative, with an MIB-1 of 20%.  (2) Treated with neoadjuvant chemotherapy consisting of  doxorubicin and cyclophosphamide in dose dense fashion x4, completed 12/11/2013, followed by paclitaxel weekly x12 (dose reduced by 20% at cycle 5 because of neuropathy concerns) completed 03/19/2014.  (3) status post left mastectomy and left axillary lymph node dissection 04/23/2014 for a pT1c pN2, stage IIIA invasive ductal carcinoma, grade 3, estrogen receptor 99% positive, progesterone receptor 4% positive, with no HER-2 amplification  (3) adjuvant radiation completed 08/11/2014 Left chest wall / 50.4 Gray @ 1.8 Gray per fraction x 28 fractions Left Supraclavicular fossa / 45 Gray _0 .8 Gray per fraction x 25 fractions Left PAB / 45 Gy at 1.8 Gray per fraction x 25 fractions Left scar / 10 Gray at [employer] per fraction x 5 fractions  (4) tamoxifen started 10/10/14, discontinued March 2017 with evidence of metastatic recurrence  (5) current smoker. Attending quitting cessation therapy with the moviation that she must quit before she has a breast reduction.   (6) genetic testing sent 10/16/2013 did not reveal any deleterious mutation in any of these genes. The genes tested were ATM, BARD1, BRCA1, BRCA2, BRIP1, CDH1, CHEK2, MRE11A, MUTYH, NBN, NF1, PALB2, PTEN, RAD50, RAD51C, RAD51D, and TP53.  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, no evidence of malignancy   (8) s/p hysterectomy and bilateral salpingo-oophorectomy 12/09/2014, with benign pathology  METASTATIC DISEASE: documented February 2017, with bone involvement, no lung or liver lesions (9) biopsy 08/13/2015 confirms metastatic breast cancer, estrogen receptor positive, progesterone receptor and HER-2 negative  (a) CA 27-29 is informative  (10) letrozole and palbociclib started 08/13/2015  (a) palbociclib dropped to 100 mg/day on 09/01/15  (  11) palliative radiation 08/23/2015-09/06/2015: Pelvis, right shoulder and left proximal femur were all treated to 30 GY in 10 fractions at 3 Gy per fraction  (12)  started zolendronate 09/01/2015   PLAN: Timmi looks significantly better now that her pain is better controlled.She is still using diluted every 2 hoursso I am increasing the methadone to 20 mg 4 times a day. I refilled that and the Dilaudid today. Her bowel prophylaxis is working well and we did not change that.  Her bone scan ulcer was a new baseline.We are going to start following her CA-27-29 now that she is on a stable dose of palbociclib. She is tolerating the letrozole well.She will have a repeat zolendronate dose in August.  At this point I am going to see her every 4 weeks until we have evidence of response and she is completely stable as far as her pain and other supportive metastases are concerned. After that I will brought the follow-up interval. Chauncey Cruel, MD 11/05/2015 2:08 PM

## 2015-11-05 NOTE — Telephone Encounter (Signed)
Gave and printed appt sched and avs for pt for june °

## 2015-11-08 ENCOUNTER — Encounter: Payer: Self-pay | Admitting: Oncology

## 2015-11-08 NOTE — Progress Notes (Signed)
Left nctraks form for prior auth for methadone for dr.magrinat to sign

## 2015-11-11 ENCOUNTER — Encounter (HOSPITAL_COMMUNITY)
Admission: RE | Admit: 2015-11-11 | Discharge: 2015-11-11 | Disposition: A | Discharge status: Home / Self Care | Payer: Medicare Other | Source: Ambulatory Visit | Attending: Nurse Practitioner | Admitting: Nurse Practitioner

## 2015-11-11 DIAGNOSIS — C419 Malignant neoplasm of bone and articular cartilage, unspecified: Secondary | ICD-10-CM | POA: Diagnosis not present

## 2015-11-11 DIAGNOSIS — C50412 Malignant neoplasm of upper-outer quadrant of left female breast: Secondary | ICD-10-CM | POA: Diagnosis not present

## 2015-11-11 MED ORDER — TECHNETIUM TC 99M MEDRONATE IV KIT
24.1000 | PACK | Freq: Once | INTRAVENOUS | Status: AC | PRN
  Administered 2015-11-11: 24.1 via INTRAVENOUS

## 2015-11-17 ENCOUNTER — Encounter: Payer: Self-pay | Admitting: Oncology

## 2015-11-17 NOTE — Progress Notes (Signed)
left nctraks for dr Jana Hakim to sign- faxed [phone]

## 2015-11-25 MED FILL — *IBRANCE 100 MG CAPSULE: 100 | 28 days supply | Qty: 21 | Fill #1

## 2015-11-26 ENCOUNTER — Other Ambulatory Visit: Payer: Self-pay | Admitting: *Deleted

## 2015-11-26 ENCOUNTER — Other Ambulatory Visit: Payer: Self-pay | Admitting: Oncology

## 2015-11-26 MED ORDER — METHADONE HCL 10 MG PO TABS: ORAL_TABLET | ORAL | Status: DC

## 2015-11-26 MED FILL — METHADONE HCL 10 MG TABLET: 10 | 15 days supply | Qty: 120 | Fill #0

## 2015-12-03 ENCOUNTER — Ambulatory Visit (HOSPITAL_BASED_OUTPATIENT_CLINIC_OR_DEPARTMENT_OTHER): Payer: Medicare Other | Admitting: Oncology

## 2015-12-03 ENCOUNTER — Telehealth: Payer: Self-pay | Admitting: Oncology

## 2015-12-03 ENCOUNTER — Other Ambulatory Visit (HOSPITAL_BASED_OUTPATIENT_CLINIC_OR_DEPARTMENT_OTHER): Payer: Medicare Other

## 2015-12-03 VITALS — BP 113/83 | HR 84 | Temp 98.7°F | Resp 18 | Ht 63.0 in | Wt 177.9 lb

## 2015-12-03 DIAGNOSIS — C7951 Secondary malignant neoplasm of bone: Secondary | ICD-10-CM

## 2015-12-03 DIAGNOSIS — G893 Neoplasm related pain (acute) (chronic): Secondary | ICD-10-CM | POA: Diagnosis not present

## 2015-12-03 DIAGNOSIS — Z79811 Long term (current) use of aromatase inhibitors: Secondary | ICD-10-CM

## 2015-12-03 DIAGNOSIS — C50812 Malignant neoplasm of overlapping sites of left female breast: Secondary | ICD-10-CM

## 2015-12-03 DIAGNOSIS — E119 Type 2 diabetes mellitus without complications: Secondary | ICD-10-CM

## 2015-12-03 DIAGNOSIS — C773 Secondary and unspecified malignant neoplasm of axilla and upper limb lymph nodes: Secondary | ICD-10-CM | POA: Diagnosis not present

## 2015-12-03 DIAGNOSIS — C50412 Malignant neoplasm of upper-outer quadrant of left female breast: Secondary | ICD-10-CM | POA: Diagnosis not present

## 2015-12-03 DIAGNOSIS — Z170 Estrogen receptor positive status [ER+]: Secondary | ICD-10-CM

## 2015-12-03 DIAGNOSIS — C50912 Malignant neoplasm of unspecified site of left female breast: Secondary | ICD-10-CM | POA: Diagnosis not present

## 2015-12-03 DIAGNOSIS — C799 Secondary malignant neoplasm of unspecified site: Secondary | ICD-10-CM

## 2015-12-03 DIAGNOSIS — D869 Sarcoidosis, unspecified: Secondary | ICD-10-CM

## 2015-12-03 LAB — CBC WITH DIFFERENTIAL/PLATELET
BASO%: 0.3 % (ref 0.0–2.0)
Basophils Absolute: 0 10*3/uL (ref 0.0–0.1)
EOS%: 4.4 % (ref 0.0–7.0)
Eosinophils Absolute: 0.1 10*3/uL (ref 0.0–0.5)
HCT: 26.9 % — ABNORMAL LOW (ref 34.8–46.6)
HGB: 8.8 g/dL — ABNORMAL LOW (ref 11.6–15.9)
LYMPH#: 0.2 10*3/uL — AB (ref 0.9–3.3)
LYMPH%: 9.2 % — AB (ref 14.0–49.7)
MCH: 31.3 pg (ref 25.1–34.0)
MCHC: 32.6 g/dL (ref 31.5–36.0)
MCV: 95.8 fL (ref 79.5–101.0)
MONO#: 0.1 10*3/uL (ref 0.1–0.9)
MONO%: 6.1 % (ref 0.0–14.0)
NEUT#: 1.9 10*3/uL (ref 1.5–6.5)
NEUT%: 80 % — AB (ref 38.4–76.8)
Platelets: 141 10*3/uL — ABNORMAL LOW (ref 145–400)
RBC: 2.8 10*6/uL — AB (ref 3.70–5.45)
RDW: 21.8 % — AB (ref 11.2–14.5)
WBC: 2.4 10*3/uL — ABNORMAL LOW (ref 3.9–10.3)

## 2015-12-03 LAB — COMPREHENSIVE METABOLIC PANEL
ALT: 25 U/L (ref 0–55)
AST: 38 U/L — AB (ref 5–34)
Albumin: 3.4 g/dL — ABNORMAL LOW (ref 3.5–5.0)
Alkaline Phosphatase: 197 U/L — ABNORMAL HIGH (ref 40–150)
Anion Gap: 10 mEq/L (ref 3–11)
BUN: 14.9 mg/dL (ref 7.0–26.0)
CHLORIDE: 106 meq/L (ref 98–109)
CO2: 24 meq/L (ref 22–29)
CREATININE: 1.3 mg/dL — AB (ref 0.6–1.1)
Calcium: 9.1 mg/dL (ref 8.4–10.4)
EGFR: 57 mL/min/{1.73_m2} — ABNORMAL LOW (ref >90)
GLUCOSE: 139 mg/dL (ref 70–140)
Potassium: 4.2 mEq/L (ref 3.5–5.1)
Sodium: 140 mEq/L (ref 136–145)
Total Bilirubin: <0.3 mg/dL (ref 0.20–1.20)
Total Protein: 7.5 g/dL (ref 6.4–8.3)

## 2015-12-03 MED ORDER — HYDROMORPHONE HCL 4 MG PO TABS: 4.0000 mg | ORAL_TABLET | ORAL | Status: DC | PRN

## 2015-12-03 MED ORDER — PROCHLORPERAZINE MALEATE 10 MG PO TABS: 10.0000 mg | ORAL_TABLET | Freq: Four times a day (QID) | ORAL | Status: DC | PRN

## 2015-12-03 MED FILL — HYDROmorphone HCL 4 MG TABS: 4 | 15 days supply | Qty: 180 | Fill #0

## 2015-12-03 MED FILL — PROCHLORPERAZINE 10 MG TAB: 10 | 7 days supply | Qty: 30 | Fill #0

## 2015-12-03 NOTE — Telephone Encounter (Signed)
appt made and avs printed. Per [facility] cxl 7/7 appt with Dr Pablo Ledger. Contacted her office to cxl appt

## 2015-12-03 NOTE — Progress Notes (Signed)
[facility]  Telephone:([phone]) [phone] Fax:([phone]) [phone]    ID: Shelly Hall OB: 1970/10/01  MR#: [id number]  UVO#:[id number]  PCP: Elyn Peers, MD GYN:  [name] SU: [name] OTHER MD: Thea Silversmith  CHIEF COMPLAINT:  Left Breast Cancer, estrogen receptor positive  CURRENT TREATMENT: letrozole, palbociclib, zolendronate  BREAST CANCER HISTORY: From the original intake note:  Shelly Hall palpated a mass in her left breast mid-April 2015 and immediately brought it to Dr. Benjie Karvonen' attention. She was set up for bilateral screening mammography with tomography at [facility] 09/19/2013. The possible distortion in the left breast and a nearby group of calcifications was felt to warrant further evaluation, and on 09/30/2013 she underwent unilateral left diagnostic mammography and ultrasonography. There was an irregular mass in the outer left breast associated with microcalcifications. 4 cm anterior to this there was another cluster of pleomorphic calcifications spanning 7 mm. He had more anteriorly there was an irregular mass associated with other calcifications. In total the abnormalities in the left breast spent approximately 10 cm, and is segmental distribution. On exam there was a palpable firm mass at the 2:30 o'clock position of the left breast 10 cm from the nipple. There was palpable adenopathy in the inferior left axilla. Ultrasound confirmed an irregular hypoechoic mass in the left breast measuring 4.6 cm. In addition, a 1.4 cm oval slightly irregular mass was noted at the 3:00 position and yet another mass measured 9 mm in the same area. Ultrasound of the left axilla showed a dominant 3.6 cm lymph node.  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 (Because all 3 biopsies were morphologically identical, only one prognostic panel was sent).  On 10/10/2013 the patient underwent bilateral breast MRI. This showed numerous enhancing masses in the left breast, the largest measuring 3.5 cm, and the aggregate measuring 12.7 cm. There were numerous enlarged left axillary lymph nodes, at least 5 of which were enlarged, both at levels 1 and 2. The largest measured 2.3 cm. The right breast was negative.  The patient's subsequent history is as detailed below.  INTERVAL HISTORY: Shelly Hall returns today for follow-up of her estrogen receptor positive metastatic breast cancer. She started the current cycle of cyclic on 63/87/5643. She has been tolerating it well. She does think it decreases her appetite and takes away her sense of taste. She is also on letrozole daily. This causes or sweats Terrence Dupont although she does not experience these as "flashes". She tolerates zolendronate well.  REVIEW OF SYSTEMS: Taleen describes herself is moderately fatigued. She continues to have pain in the pelvis lower back and the right upper arm. Her vision is poor and she has an appointment already scheduled for September. Her ears feel "full" and a clinic sometimes when she swallows. She is occasionally nauseated. She has urinary stress incontinence which is not new. She feels anxious and depressed. She denies unusual headaches or dizziness. A detailed review of systems today was otherwise stable \  PAST MEDICAL HISTORY: Past Medical History  Diagnosis Date  . Hypertension   . Sarcoidosis ([place])     no problems per patient   . Anemia   . Complication of anesthesia     makes pt. restless and unable to be still  .  High cholesterol   . Thyroid cancer ([place]) 2015  . Type II diabetes mellitus ([place])   . Breast cancer ([place]) 2015    left upper outer;  had chemo  . Radiation 06/23/14-08/11/14    Invasive ductal ca o  left breast  . SVD (spontaneous vaginal delivery)     x 2  . Heart murmur     never had any problems as adult  . Bronchitis 2014  . Depression     no meds currently  . Anxiety     no meds currently  . GERD (gastroesophageal reflux disease)     PAST SURGICAL HISTORY: Past Surgical History  Procedure Laterality Date  . Tubal ligation  2001  . Endobronchial ultrasound Bilateral 12/02/2012    Procedure: ENDOBRONCHIAL ULTRASOUND;  Surgeon: Collene Gobble, MD;  Location: [facility];  Service: Cardiopulmonary;  Laterality: Bilateral;  . Portacath placement N/A 10/22/2013    Procedure: INSERTION PORT-A-CATH;  Surgeon: [name], MD;  Location: [place];  Service: General;  Laterality: N/A;  . Lung biopsy  2014  . Mastectomy w/ sentinel node biopsy Left 04/23/2014    & axillary LND  . Mastectomy w/ sentinel node biopsy Left 04/23/2014    Procedure: LEFT BREAST MASTECTOMY WITH SENTINEL LYMPH NODE BIOPSY AND AXILLARY LYMPH NODE DISECTION;  Surgeon: [name], MD;  Location: [place];  Service: General;  Laterality: Left;  . Thyroid lobectomy Left 04/23/2014    Procedure: LEFT THYROID LOBECTOMY;  Surgeon: [name], MD;  Location: [place];  Service: General;  Laterality: Left;  . Breast biopsy Left 2015 X 3    biopsy  . Wisdom tooth extraction    . Leep    . Laparoscopic assisted vaginal hysterectomy N/A 12/09/2014    Procedure: LAPAROSCOPIC ASSISTED VAGINAL HYSTERECTOMY;  Surgeon: Cheri Fowler, MD;  Location: [facility];  Service: Gynecology;  Laterality: N/A;  . Salpingoophorectomy Bilateral 12/09/2014    Procedure: SALPINGO OOPHORECTOMY;  Surgeon: Cheri Fowler, MD;  Location: [facility];  Service: Gynecology;  Laterality: Bilateral;  . Abdominal hysterectomy    . Port-a-cath removal Left 02/03/2015    Procedure: REMOVAL PORT-A-CATH;  Surgeon: [name], MD;  Location: [facility];  Service: General;  Laterality: Left;    FAMILY HISTORY Family History  Problem Relation Age  of Onset  . Cancer Paternal Aunt     "bone cancer"; deceased 64s  . Cancer Paternal Uncle     stomach cancer; deceased 62s  . Cancer Paternal Aunt     unk. primary; currently 20s  . Prostate cancer Maternal Grandfather 51  . Asthma Mother   The patient's parents are living, in fair mid to late 76s. The patient had one brother, no sisters. There is no history of breast or ovarian cancer in the family to her knowledge.   GYNECOLOGIC HISTORY:   (Reviewed 11/27/2013)  menarche age 67, first live birth at 66. She is GX P2. She was still having regular periods at the time of the start of her chemotherapy in May 2015.  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 She is single and lives alone, although her mother is currently staying with her. Her son Eleanora Guinyard lives in [place] and works at [employer]. Son [name] lives in [place] where he works at a [employer]. The patient has 1 grandchild born in Sept 2015, by her son, [name]. She is a Psychologist, forensic.    ADVANCED DIRECTIVES: not in place; discussed  again on 10/08/2015   HEALTH MAINTENANCE: (Updated 11/27/2013) Social History  Substance Use Topics  . Smoking status: Current Some Day Smoker -- 0.25 packs/day for 25 years    Types: Cigarettes  . Smokeless tobacco: Never Used     Comment: 04/23/2014 "using vapor; down to 3-4 cigarettes/day now"  . Alcohol Use: 6.0 oz/week    4 Cans of beer, 4 Shots of liquor, 2 Standard drinks or equivalent per week     Colonoscopy: Never  PAP: Not on file  Bone density: Not on file  Lipid panel:  Not on file  Allergies  Allergen Reactions  . Nyquil Multi-Symptom [Pseudoeph-Doxylamine-Dm-Apap] Itching and Other (See Comments)    Skin peels on hands, and feet    Current Outpatient Prescriptions  Medication Sig Dispense Refill  . ferrous sulfate 325 (65 FE) MG tablet Take 325 mg by mouth daily with breakfast.     . FLUoxetine  (PROZAC) 20 MG tablet Take 20 mg by mouth every morning.    . gabapentin (NEURONTIN) 300 MG capsule Take one capsule in the morning, one afternoon, and two at bedtime 120 capsule 5  . HYDROmorphone (DILAUDID) 4 MG tablet Take 1 tablet (4 mg total) by mouth every 2 (two) hours as needed for severe pain. 180 tablet 0  . letrozole (FEMARA) 2.5 MG tablet Take 1 tablet (2.5 mg total) by mouth daily. 90 tablet 4  . metFORMIN (GLUCOPHAGE) 1000 MG tablet Take 1,000 mg by mouth 2 (two) times daily with a meal.     . methadone (DOLOPHINE) 10 MG tablet TAKE 2 TABLETS BY MOUTH 4 TIMES DAILY 120 tablet 0  . omeprazole (PRILOSEC) 40 MG capsule Take 1 capsule (40 mg total) by mouth daily. 90 capsule 12  . palbociclib (IBRANCE) 100 MG capsule Take 1 capsule (100 mg total) by mouth daily with breakfast. Take whole with food. 21 capsule 2  . polyethylene glycol powder (GLYCOLAX/MIRALAX) powder Take 17 g by mouth daily. 500 g 3  . prochlorperazine (COMPAZINE) 10 MG tablet Take 1 tablet (10 mg total) by mouth every 6 (six) hours as needed for nausea or vomiting. 30 tablet 0  . senna (SENOKOT) 8.6 MG TABS tablet Take 2 tablets (17.2 mg total) by mouth 2 (two) times daily. 120 each 0   No current facility-administered medications for this visit.    OBJECTIVE: Middle-aged [place] American woman who appears stated age 16 Vitals:   12/03/15 1425  BP: 113/83  Pulse: 84  Temp: 98.7 F (37.1 C)  Resp: 18     Body mass index is 31.52 kg/(m^2).    ECOG FS:2 - Symptomatic, <50% confined to bed   Sclerae unicteric, EOMs intact Oropharynx clear and moist No cervical or supraclavicular adenopathy Lungs no rales or rhonchi Heart regular rate and rhythm Abd soft, obese, nontender, positive bowel sounds MSK no focal spinal tenderness to mild palpation Neuro: nonfocal, well oriented, appropriate affect Breasts: Deferred     LAB RESULTS:   Lab Results  Component Value Date   WBC 2.4* 12/03/2015   NEUTROABS  1.9 12/03/2015   HGB 8.8* 12/03/2015   HCT 26.9* 12/03/2015   MCV 95.8 12/03/2015   PLT 141* 12/03/2015      Chemistry      Component Value Date/Time   NA 140 12/03/2015 1326   NA 139 10/01/2015 0331   K 4.2 12/03/2015 1326   K 5.0 10/01/2015 0331   CL 105 10/01/2015 0331   CO2 24 12/03/2015 1326  CO2 24 10/01/2015 0331   BUN 14.9 12/03/2015 1326   BUN 21* 10/01/2015 0331   CREATININE 1.3* 12/03/2015 1326   CREATININE 1.07* 10/01/2015 0331      Component Value Date/Time   CALCIUM 9.1 12/03/2015 1326   CALCIUM 9.0 10/01/2015 0331   ALKPHOS 197* 12/03/2015 1326   ALKPHOS 170* 10/01/2015 0331   AST 38* 12/03/2015 1326   AST 24 10/01/2015 0331   ALT 25 12/03/2015 1326   ALT 20 10/01/2015 0331   BILITOT <0.30 12/03/2015 1326   BILITOT 0.3 10/01/2015 0331      STUDIES: Nm Bone Scan Whole Body  11/11/2015  CLINICAL DATA:  History of left breast cancer with bone metastases. No recent falls or trauma. EXAM: NUCLEAR MEDICINE WHOLE BODY BONE SCAN TECHNIQUE: Whole body anterior and posterior images were obtained approximately 3 hours after intravenous injection of radiopharmaceutical. RADIOPHARMACEUTICALS:  24.1 mCi Technetium-36mMDP IV COMPARISON:  Previous bone scan 07/28/2015 and CT 08/03/2015 and 08/19/2015 FINDINGS: Exam demonstrates a focal region of increased radiotracer activity over the right skull unchanged. There is overall increased radiotracer uptake over the skull. Increased uptake over the right distal clavicle, inferior right scapula, right posterior ribs, thoracic spine and pelvic bones compatible with known osseous metastatic disease. Patchy uptake over the proximal femurs right worse than left as well as uptake over the femoral diaphysis right greater than left compatible with osseous metastatic disease. Findings are overall slightly progressed compared to the prior exam. Possible uptake left mid humerus. Mild degenerative changes of the knees and feet/ankles as well as  elbows. IMPRESSION: Evidence of known diffuse osseous metastatic disease involving the axial and appendicular skeleton with slight overall increased radiotracer intensity suggesting mild interval progression versus mild flare phenomenon related to treatment. Electronically Signed   By: DMarin OlpM.D.   On: 11/11/2015 14:24    ASSESSMENT: 45y.o. BRCA negative [place] woman with a history of sarcoidosis and breast cancer as follows:  (1)  status post left breast and left axillary lymph node biopsy 10/03/2013, both positive for a clinical T2 N2, stage IIIA invasive ductal carcinoma, grade 2, estrogen and progesterone receptor positive, HER-2 negative, with an MIB-1 of 20%.  (2) Treated with neoadjuvant chemotherapy consisting of doxorubicin and cyclophosphamide in dose dense fashion x4, completed 12/11/2013, followed by paclitaxel weekly x12 (dose reduced by 20% at cycle 5 because of neuropathy concerns) completed 03/19/2014.  (3) status post left mastectomy and left axillary lymph node dissection 04/23/2014 for a pT1c pN2, stage IIIA invasive ductal carcinoma, grade 3, estrogen receptor 99% positive, progesterone receptor 4% positive, with no HER-2 amplification  (3) adjuvant radiation completed 08/11/2014 Left chest wall / 50.4 Gray @ 1.8 Gray per fraction x 28 fractions Left Supraclavicular fossa / 45 Gray '@1'$ .8 Gray per fraction x 25 fractions Left PAB / 45 Gy at 1.8 Gray per fraction x 25 fractions Left scar / 10 Gray at 2[employer] Corporationper fraction x 5 fractions  (4) tamoxifen started 10/10/14, discontinued March 2017 with evidence of metastatic recurrence  (5) current smoker. Attending quitting cessation therapy with the moviation that she must quit before she has a breast reduction.   (6) genetic testing sent 10/16/2013 did not reveal any deleterious mutation in any of these genes. The genes tested were ATM, BARD1, BRCA1, BRCA2, BRIP1, CDH1, CHEK2, MRE11A, MUTYH, NBN, NF1, PALB2, PTEN, RAD50,  RAD51C, RAD51D, and TP53.  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, no evidence of malignancy   (  8) s/p hysterectomy and bilateral salpingo-oophorectomy 12/09/2014, with benign pathology  METASTATIC DISEASE: documented February 2017, with bone involvement, no lung or liver lesions (9) biopsy 08/13/2015 confirms metastatic breast cancer, estrogen receptor positive, progesterone receptor and HER-2 negative  (a) CA 27-29 is informative  (10) letrozole and palbociclib started 08/13/2015  (a) palbociclib dropped to 100 mg/day on 09/01/15  (11) palliative radiation 08/23/2015-09/06/2015: Pelvis, right shoulder and left proximal femur were all treated to 30 GY in 10 fractions at 3 Gy per fraction  (12) started zolendronate 09/01/2015   PLAN: Denene's pain is finally better controlled. She is able to get around more, take care of her grandchild, and wants to volunteer. Nevertheless she remains very limited in what she can do, of course. She does have a tendency to fall asleep which is of concern.  She tolerates the eye letrozole and palbociclib remarkably well and we have not had significant cytopenias.  At this point we are going to check lab work and months from now, which is when her next cycle would be starting, and then August 21. She will see me in with the August visit and she will receive zolendronate that same day.  We will repeat a bone scan sometime in November  I am adding Compazine to her medications since occasionally she experiences a little bit of nausea.  Rodena Piety knows to call for any problems that may develop before her next visit here.  Chauncey Cruel, MD 12/03/2015 2:30 PM

## 2015-12-04 DIAGNOSIS — C50412 Malignant neoplasm of upper-outer quadrant of left female breast: Secondary | ICD-10-CM | POA: Diagnosis not present

## 2015-12-04 LAB — CANCER ANTIGEN 27.29: CA 27.29: 437.5 U/mL — ABNORMAL HIGH (ref 0.0–38.6)

## 2015-12-07 ENCOUNTER — Other Ambulatory Visit: Payer: Self-pay | Admitting: Oncology

## 2015-12-07 NOTE — Progress Notes (Unsigned)
The CA 27 is up-- need to give her current tratment a little longer before definitive restaging

## 2015-12-10 ENCOUNTER — Ambulatory Visit: Payer: Medicaid Other | Admitting: Radiation Oncology

## 2015-12-10 ENCOUNTER — Telehealth: Payer: Self-pay | Admitting: *Deleted

## 2015-12-10 NOTE — Telephone Encounter (Signed)
This RN spoke with pt per call.  She is concerned due to the area of pain is " where my pain first occurred when my cancer came back "  With current therapy pain improved " but now is returning "  As per discussion with TRIAGE nurse - pain is managed with current dosing of dilaudid and methadone.  This note will be forwarded to MD for other recommendations such as possible need for radiation review.

## 2015-12-10 NOTE — Telephone Encounter (Signed)
Patient called concerned about new pain in her right shoulder.  She did say she had a knot there a while ago and it went away, but now it is recurring.  She states the pain started 3 days ago.  She thought it would go away, but it did not.  She reports it is a dull aching pain, but sometimes sharp.  She rates it a 3/10 currently.  She has both dilaudid and methadone which both are effective for the pain.  She is mainly calling b/c the pain is new and is of concern to her.  Let her know that I will notify Dr. Jana Hakim and his nurse.

## 2015-12-12 ENCOUNTER — Other Ambulatory Visit: Payer: Self-pay | Admitting: Oncology

## 2015-12-17 ENCOUNTER — Other Ambulatory Visit: Payer: Self-pay

## 2015-12-17 ENCOUNTER — Telehealth: Payer: Self-pay

## 2015-12-17 DIAGNOSIS — I10 Essential (primary) hypertension: Secondary | ICD-10-CM | POA: Diagnosis not present

## 2015-12-17 DIAGNOSIS — H68012 Acute Eustachian salpingitis, left ear: Secondary | ICD-10-CM | POA: Diagnosis not present

## 2015-12-17 DIAGNOSIS — C50119 Malignant neoplasm of central portion of unspecified female breast: Secondary | ICD-10-CM | POA: Diagnosis not present

## 2015-12-17 DIAGNOSIS — E118 Type 2 diabetes mellitus with unspecified complications: Secondary | ICD-10-CM | POA: Diagnosis not present

## 2015-12-17 MED ORDER — METHADONE HCL 10 MG PO TABS: ORAL_TABLET | ORAL | Status: DC

## 2015-12-17 MED FILL — METHADONE HCL 10 MG TABLET: 10 | 15 days supply | Qty: 120 | Fill #0

## 2015-12-17 NOTE — Telephone Encounter (Signed)
Pt called for refill of methadone. She needs it for the weekend. Please call her when ready for pickup

## 2015-12-22 MED FILL — POLYETHYLENE GLYCOL 3350 PO: 31 days supply | Qty: 527 | Fill #1

## 2015-12-24 ENCOUNTER — Telehealth: Payer: Self-pay

## 2015-12-24 ENCOUNTER — Other Ambulatory Visit: Payer: Self-pay | Admitting: *Deleted

## 2015-12-24 ENCOUNTER — Telehealth: Payer: Self-pay | Admitting: Oncology

## 2015-12-24 MED ORDER — HYDROMORPHONE HCL 4 MG PO TABS: 4.0000 mg | ORAL_TABLET | ORAL | Status: DC | PRN

## 2015-12-24 MED FILL — HYDROmorphone HCL 4 MG TABS: 4 | 15 days supply | Qty: 180 | Fill #0

## 2015-12-24 NOTE — Telephone Encounter (Signed)
Pt called for a dilaudid refill. She is going to the beach Sunday and will not be back until following Sunday. She does not have enough to get through the week. Please call [facility] when Rx ready.

## 2015-12-24 NOTE — Telephone Encounter (Signed)
Pt came in to resched lab will be out of town that week, gave pt cal

## 2015-12-27 ENCOUNTER — Other Ambulatory Visit: Payer: Medicare Other

## 2016-01-04 ENCOUNTER — Other Ambulatory Visit (HOSPITAL_BASED_OUTPATIENT_CLINIC_OR_DEPARTMENT_OTHER): Payer: Medicare Other

## 2016-01-04 ENCOUNTER — Telehealth: Payer: Self-pay

## 2016-01-04 DIAGNOSIS — C7951 Secondary malignant neoplasm of bone: Secondary | ICD-10-CM

## 2016-01-04 DIAGNOSIS — G893 Neoplasm related pain (acute) (chronic): Secondary | ICD-10-CM | POA: Diagnosis not present

## 2016-01-04 DIAGNOSIS — E119 Type 2 diabetes mellitus without complications: Secondary | ICD-10-CM

## 2016-01-04 DIAGNOSIS — C50812 Malignant neoplasm of overlapping sites of left female breast: Secondary | ICD-10-CM

## 2016-01-04 DIAGNOSIS — C50412 Malignant neoplasm of upper-outer quadrant of left female breast: Secondary | ICD-10-CM | POA: Diagnosis not present

## 2016-01-04 DIAGNOSIS — C50912 Malignant neoplasm of unspecified site of left female breast: Secondary | ICD-10-CM

## 2016-01-04 DIAGNOSIS — D869 Sarcoidosis, unspecified: Secondary | ICD-10-CM

## 2016-01-04 LAB — COMPREHENSIVE METABOLIC PANEL
ALT: 33 U/L (ref 0–55)
ANION GAP: 8 meq/L (ref 3–11)
AST: 57 U/L — ABNORMAL HIGH (ref 5–34)
Albumin: 3.5 g/dL (ref 3.5–5.0)
Alkaline Phosphatase: 187 U/L — ABNORMAL HIGH (ref 40–150)
BUN: 13.2 mg/dL (ref 7.0–26.0)
CHLORIDE: 104 meq/L (ref 98–109)
CO2: 27 meq/L (ref 22–29)
Calcium: 9.7 mg/dL (ref 8.4–10.4)
Creatinine: 1.1 mg/dL (ref 0.6–1.1)
EGFR: 68 mL/min/{1.73_m2} — AB (ref >90)
Glucose: 117 mg/dl (ref 70–140)
POTASSIUM: 4.1 meq/L (ref 3.5–5.1)
Sodium: 139 mEq/L (ref 136–145)
TOTAL PROTEIN: 7.5 g/dL (ref 6.4–8.3)

## 2016-01-04 LAB — CBC WITH DIFFERENTIAL/PLATELET
BASO%: 0.2 % (ref 0.0–2.0)
BASOS ABS: 0 10*3/uL (ref 0.0–0.1)
EOS ABS: 0.1 10*3/uL (ref 0.0–0.5)
EOS%: 2.1 % (ref 0.0–7.0)
HCT: 26.3 % — ABNORMAL LOW (ref 34.8–46.6)
HGB: 8.4 g/dL — ABNORMAL LOW (ref 11.6–15.9)
LYMPH%: 11 % — AB (ref 14.0–49.7)
MCH: 31.8 pg (ref 25.1–34.0)
MCHC: 31.8 g/dL (ref 31.5–36.0)
MCV: 100 fL (ref 79.5–101.0)
MONO#: 0.4 10*3/uL (ref 0.1–0.9)
MONO%: 12.6 % (ref 0.0–14.0)
NEUT#: 2.1 10*3/uL (ref 1.5–6.5)
NEUT%: 74.1 % (ref 38.4–76.8)
PLATELETS: 118 10*3/uL — AB (ref 145–400)
RBC: 2.63 10*6/uL — AB (ref 3.70–5.45)
RDW: 20.5 % — ABNORMAL HIGH (ref 11.2–14.5)
WBC: 2.9 10*3/uL — ABNORMAL LOW (ref 3.9–10.3)
lymph#: 0.3 10*3/uL — ABNORMAL LOW (ref 0.9–3.3)

## 2016-01-04 MED FILL — *IBRANCE 100 MG CAPSULE: 100 | 28 days supply | Qty: 21 | Fill #2

## 2016-01-04 NOTE — Telephone Encounter (Signed)
Pt called for time of today's appt. Given. Discussed that Dr Jana Hakim and all such questions are under "medical oncology" on the phone tree.

## 2016-01-05 DIAGNOSIS — C50119 Malignant neoplasm of central portion of unspecified female breast: Secondary | ICD-10-CM | POA: Diagnosis not present

## 2016-01-05 LAB — CANCER ANTIGEN 27.29: CA 27.29: 828.7 U/mL — ABNORMAL HIGH (ref 0.0–38.6)

## 2016-01-07 ENCOUNTER — Other Ambulatory Visit: Payer: Self-pay

## 2016-01-07 ENCOUNTER — Encounter (HOSPITAL_COMMUNITY): Payer: Self-pay | Admitting: Emergency Medicine

## 2016-01-07 ENCOUNTER — Emergency Department (HOSPITAL_COMMUNITY): Payer: Medicare Other

## 2016-01-07 ENCOUNTER — Inpatient Hospital Stay (HOSPITAL_COMMUNITY)
Admission: EM | Admit: 2016-01-07 | Discharge: 2016-01-18 | DRG: 948 | Disposition: A | Discharge status: Home Health | Payer: Medicare Other | Attending: Family Medicine | Admitting: Family Medicine

## 2016-01-07 DIAGNOSIS — Z888 Allergy status to other drugs, medicaments and biological substances status: Secondary | ICD-10-CM

## 2016-01-07 DIAGNOSIS — E876 Hypokalemia: Secondary | ICD-10-CM | POA: Diagnosis not present

## 2016-01-07 DIAGNOSIS — M2550 Pain in unspecified joint: Secondary | ICD-10-CM

## 2016-01-07 DIAGNOSIS — Z79899 Other long term (current) drug therapy: Secondary | ICD-10-CM

## 2016-01-07 DIAGNOSIS — C7951 Secondary malignant neoplasm of bone: Secondary | ICD-10-CM | POA: Diagnosis not present

## 2016-01-07 DIAGNOSIS — M25561 Pain in right knee: Secondary | ICD-10-CM | POA: Diagnosis not present

## 2016-01-07 DIAGNOSIS — Z85850 Personal history of malignant neoplasm of thyroid: Secondary | ICD-10-CM

## 2016-01-07 DIAGNOSIS — K219 Gastro-esophageal reflux disease without esophagitis: Secondary | ICD-10-CM | POA: Diagnosis present

## 2016-01-07 DIAGNOSIS — R000 Tachycardia, unspecified: Secondary | ICD-10-CM | POA: Diagnosis not present

## 2016-01-07 DIAGNOSIS — C50812 Malignant neoplasm of overlapping sites of left female breast: Secondary | ICD-10-CM | POA: Diagnosis not present

## 2016-01-07 DIAGNOSIS — C50912 Malignant neoplasm of unspecified site of left female breast: Secondary | ICD-10-CM | POA: Diagnosis not present

## 2016-01-07 DIAGNOSIS — I10 Essential (primary) hypertension: Secondary | ICD-10-CM | POA: Diagnosis present

## 2016-01-07 DIAGNOSIS — R140 Abdominal distension (gaseous): Secondary | ICD-10-CM | POA: Diagnosis not present

## 2016-01-07 DIAGNOSIS — C50919 Malignant neoplasm of unspecified site of unspecified female breast: Secondary | ICD-10-CM | POA: Diagnosis not present

## 2016-01-07 DIAGNOSIS — D61810 Antineoplastic chemotherapy induced pancytopenia: Secondary | ICD-10-CM | POA: Diagnosis not present

## 2016-01-07 DIAGNOSIS — R079 Chest pain, unspecified: Secondary | ICD-10-CM

## 2016-01-07 DIAGNOSIS — G893 Neoplasm related pain (acute) (chronic): Principal | ICD-10-CM | POA: Diagnosis present

## 2016-01-07 DIAGNOSIS — E119 Type 2 diabetes mellitus without complications: Secondary | ICD-10-CM | POA: Diagnosis not present

## 2016-01-07 DIAGNOSIS — L0292 Furuncle, unspecified: Secondary | ICD-10-CM | POA: Diagnosis not present

## 2016-01-07 DIAGNOSIS — Z170 Estrogen receptor positive status [ER+]: Secondary | ICD-10-CM

## 2016-01-07 DIAGNOSIS — E7800 Pure hypercholesterolemia, unspecified: Secondary | ICD-10-CM | POA: Diagnosis not present

## 2016-01-07 DIAGNOSIS — D638 Anemia in other chronic diseases classified elsewhere: Secondary | ICD-10-CM | POA: Diagnosis not present

## 2016-01-07 DIAGNOSIS — M25562 Pain in left knee: Secondary | ICD-10-CM | POA: Diagnosis not present

## 2016-01-07 DIAGNOSIS — K5900 Constipation, unspecified: Secondary | ICD-10-CM | POA: Diagnosis not present

## 2016-01-07 DIAGNOSIS — R61 Generalized hyperhidrosis: Secondary | ICD-10-CM | POA: Diagnosis not present

## 2016-01-07 DIAGNOSIS — M84411A Pathological fracture, right shoulder, initial encounter for fracture: Secondary | ICD-10-CM | POA: Diagnosis present

## 2016-01-07 DIAGNOSIS — Z90710 Acquired absence of both cervix and uterus: Secondary | ICD-10-CM

## 2016-01-07 DIAGNOSIS — R011 Cardiac murmur, unspecified: Secondary | ICD-10-CM | POA: Diagnosis not present

## 2016-01-07 DIAGNOSIS — R530 Neoplastic (malignant) related fatigue: Secondary | ICD-10-CM | POA: Diagnosis present

## 2016-01-07 DIAGNOSIS — D869 Sarcoidosis, unspecified: Secondary | ICD-10-CM | POA: Diagnosis present

## 2016-01-07 DIAGNOSIS — N949 Unspecified condition associated with female genital organs and menstrual cycle: Secondary | ICD-10-CM | POA: Diagnosis not present

## 2016-01-07 DIAGNOSIS — E860 Dehydration: Secondary | ICD-10-CM | POA: Diagnosis present

## 2016-01-07 DIAGNOSIS — N764 Abscess of vulva: Secondary | ICD-10-CM | POA: Diagnosis present

## 2016-01-07 DIAGNOSIS — Z79891 Long term (current) use of opiate analgesic: Secondary | ICD-10-CM

## 2016-01-07 DIAGNOSIS — Z825 Family history of asthma and other chronic lower respiratory diseases: Secondary | ICD-10-CM

## 2016-01-07 DIAGNOSIS — Z789 Other specified health status: Secondary | ICD-10-CM | POA: Diagnosis not present

## 2016-01-07 DIAGNOSIS — D61818 Other pancytopenia: Secondary | ICD-10-CM | POA: Diagnosis not present

## 2016-01-07 DIAGNOSIS — Z9221 Personal history of antineoplastic chemotherapy: Secondary | ICD-10-CM

## 2016-01-07 DIAGNOSIS — R0781 Pleurodynia: Secondary | ICD-10-CM | POA: Diagnosis not present

## 2016-01-07 DIAGNOSIS — R52 Pain, unspecified: Secondary | ICD-10-CM | POA: Diagnosis present

## 2016-01-07 DIAGNOSIS — M25571 Pain in right ankle and joints of right foot: Secondary | ICD-10-CM | POA: Diagnosis not present

## 2016-01-07 DIAGNOSIS — D649 Anemia, unspecified: Secondary | ICD-10-CM | POA: Diagnosis present

## 2016-01-07 DIAGNOSIS — Z800 Family history of malignant neoplasm of digestive organs: Secondary | ICD-10-CM

## 2016-01-07 DIAGNOSIS — Z9012 Acquired absence of left breast and nipple: Secondary | ICD-10-CM

## 2016-01-07 DIAGNOSIS — Z7984 Long term (current) use of oral hypoglycemic drugs: Secondary | ICD-10-CM

## 2016-01-07 DIAGNOSIS — Z7951 Long term (current) use of inhaled steroids: Secondary | ICD-10-CM

## 2016-01-07 DIAGNOSIS — Z515 Encounter for palliative care: Secondary | ICD-10-CM

## 2016-01-07 DIAGNOSIS — F17210 Nicotine dependence, cigarettes, uncomplicated: Secondary | ICD-10-CM | POA: Diagnosis present

## 2016-01-07 DIAGNOSIS — Z808 Family history of malignant neoplasm of other organs or systems: Secondary | ICD-10-CM

## 2016-01-07 DIAGNOSIS — Z923 Personal history of irradiation: Secondary | ICD-10-CM

## 2016-01-07 DIAGNOSIS — R7989 Other specified abnormal findings of blood chemistry: Secondary | ICD-10-CM

## 2016-01-07 LAB — CBC WITH DIFFERENTIAL/PLATELET
BASOS PCT: 0 %
Basophils Absolute: 0 10*3/uL (ref 0.0–0.1)
EOS ABS: 0.1 10*3/uL (ref 0.0–0.7)
EOS PCT: 2 %
HCT: 26.8 % — ABNORMAL LOW (ref 36.0–46.0)
HEMOGLOBIN: 8.7 g/dL — AB (ref 12.0–15.0)
Lymphocytes Relative: 11 %
Lymphs Abs: 0.4 10*3/uL — ABNORMAL LOW (ref 0.7–4.0)
MCH: 32 pg (ref 26.0–34.0)
MCHC: 32.5 g/dL (ref 30.0–36.0)
MCV: 98.5 fL (ref 78.0–100.0)
MONO ABS: 0.4 10*3/uL (ref 0.1–1.0)
MONOS PCT: 13 %
NEUTROS PCT: 74 %
Neutro Abs: 2.5 10*3/uL (ref 1.7–7.7)
PLATELETS: 109 10*3/uL — AB (ref 150–400)
RBC: 2.72 MIL/uL — ABNORMAL LOW (ref 3.87–5.11)
RDW: 19.1 % — AB (ref 11.5–15.5)
WBC: 3.3 10*3/uL — ABNORMAL LOW (ref 4.0–10.5)

## 2016-01-07 LAB — URINALYSIS, ROUTINE W REFLEX MICROSCOPIC
Bilirubin Urine: NEGATIVE
Glucose, UA: NEGATIVE mg/dL
Hgb urine dipstick: NEGATIVE
Ketones, ur: NEGATIVE mg/dL
Leukocytes, UA: NEGATIVE
Nitrite: NEGATIVE
Protein, ur: NEGATIVE mg/dL
Specific Gravity, Urine: 1.014 (ref 1.005–1.030)
pH: 6.5 (ref 5.0–8.0)

## 2016-01-07 LAB — COMPREHENSIVE METABOLIC PANEL
ALBUMIN: 3.9 g/dL (ref 3.5–5.0)
ALK PHOS: 196 U/L — AB (ref 38–126)
ALT: 32 U/L (ref 14–54)
AST: 63 U/L — AB (ref 15–41)
Anion gap: 8 (ref 5–15)
BILIRUBIN TOTAL: 0.4 mg/dL (ref 0.3–1.2)
BUN: 15 mg/dL (ref 6–20)
CALCIUM: 8.8 mg/dL — AB (ref 8.9–10.3)
CO2: 24 mmol/L (ref 22–32)
Chloride: 103 mmol/L (ref 101–111)
Creatinine, Ser: 1.05 mg/dL — ABNORMAL HIGH (ref 0.44–1.00)
GFR calc Af Amer: >60 mL/min (ref >60)
GFR calc non Af Amer: >60 mL/min (ref >60)
GLUCOSE: 185 mg/dL — AB (ref 65–99)
POTASSIUM: 3.4 mmol/L — AB (ref 3.5–5.1)
Sodium: 135 mmol/L (ref 135–145)
TOTAL PROTEIN: 8 g/dL (ref 6.5–8.1)

## 2016-01-07 LAB — I-STAT TROPONIN, ED: TROPONIN I, POC: 0 ng/mL (ref 0.00–0.08)

## 2016-01-07 LAB — I-STAT CG4 LACTIC ACID, ED: Lactic Acid, Venous: 1.79 mmol/L (ref 0.5–1.9)

## 2016-01-07 MED ORDER — METHADONE HCL 5 MG PO TABS
20.0000 mg | ORAL_TABLET | Freq: Once | ORAL | Status: AC
  Administered 2016-01-07: 20 mg via ORAL
  Filled 2016-01-07: qty 4

## 2016-01-07 MED ORDER — HYDROMORPHONE HCL 2 MG/ML IJ SOLN
2.0000 mg | Freq: Once | INTRAMUSCULAR | Status: AC
  Administered 2016-01-07: 2 mg via INTRAVENOUS
  Filled 2016-01-07: qty 1

## 2016-01-07 MED ORDER — POTASSIUM CHLORIDE CRYS ER 20 MEQ PO TBCR
20.0000 meq | EXTENDED_RELEASE_TABLET | Freq: Once | ORAL | Status: AC
  Administered 2016-01-07: 20 meq via ORAL
  Filled 2016-01-07: qty 1

## 2016-01-07 MED ORDER — SODIUM CHLORIDE 0.9 % IV BOLUS (SEPSIS)
1000.0000 mL | Freq: Once | INTRAVENOUS | Status: AC
  Administered 2016-01-07: 1000 mL via INTRAVENOUS

## 2016-01-07 MED ORDER — HYDROMORPHONE HCL 2 MG/ML IJ SOLN
2.0000 mg | Freq: Once | INTRAMUSCULAR | Status: AC
Start: 2016-01-07 — End: 2016-01-07
  Administered 2016-01-07: 2 mg via INTRAVENOUS
  Filled 2016-01-07: qty 1

## 2016-01-07 NOTE — ED Triage Notes (Signed)
Pt reports generalized body aches for the past 2 days. Pt thinks she is dehydrated. Pt reports pain is worst in her chest only with movement. No pain without movement. Also having dizziness/lightheadedness. Denies dysuria, but urine darker than usual. Went to PCP for this 2 days ago, no sign of UTI. No cough. Hx of breast cancer, last chemo within 3 weeks.

## 2016-01-07 NOTE — ED Notes (Deleted)
Pt aware urine sample needed. Cannot produce at this time-will recheck.

## 2016-01-07 NOTE — ED Notes (Signed)
Pt's o2 sat noted to be 88% on RA prior to administering dilaudid.  2L O2 via [place] applied w/ an improvement in O2 sats to 99%.

## 2016-01-07 NOTE — H&P (Signed)
Shelly Hall [id number] DOB: April 04, 1971 DOA: 01/07/2016     PCP: Elyn Peers, MD   Outpatient Specialists: [facility], SU: [name] Patient coming from:   home Lives alone,      Chief Complaint: Pain in multiple sites  HPI: Shelly Hall is a 45 y.o. female with medical history significant of DM, metastatic breast cancer with spread to bone, sarcoidosis, HTN, chronic anemia, GERD thyroid mass showed no evidence of malignancy    Presented with worsening pain she started to have new pain in her right shoulder about a month ago pain was not controlled with by mouth Dilaudid and methadone. She developed generalized body aches for the past 2 days pain in ankles thighs and knees bilateraly,  her pain has become worse and she started to experience some chest pain with movement she feels lightheaded and noticed that her urine is darker than usual and she is concerned that she's been dehydrated she was seen for this by her primary care provider 2 days ago was tested for UTI and was negative she denies any cough or fever. She reports decreased appetite. She has been on Ibrance 100 for few months and has been able to tolerate it.  Pain is worse in her back ribs and shoulder. She has known metastases to spine and pelvis. A not controlled by home medications patient denies any nausea or vomiting no diarrhea she's lightheaded but not been passing out. Headache no neurological complaints  Regarding pertinent Chronic problems: Diagnosed with breast cancer since 2015 status post neoadjuvant chemotherapy mastectomy and radiation therapy and tamoxifen which had to be discontinued in March 2017 secondary to metastatic recurrence she has chronic pain in her pelvis and lower back as well as right upper arm. She was treated with letrozole and palbociclib started 08/13/2015 stopped 09/01/15 and palliative radiation. She is on methadone for her chronic pain  IN ER: Afebrile  initially satting 97% room air to administration of IV Dilaudid patient experienced desaturation down to 88% and was started on 2 L Blood pressure initially 105/76 heart rate 80s like his blood pressure up to 118/81 Potassium 3.4 cranial 1.05 which is around baseline calcium 8.8 WBC 3.3 which is around her baseline hemoglobin 8.7 which is at baseline platelets 109 which likely below in June she was   Chest x-ray show no current cardiopulmonary disease but pathologic fracture of the distal right clavicle and also his metastatic disease widespread She was given Dilaudid 2 mg IV X 3 methadone 20 mg by mouth 1 L normal saline 20 mEq of potassium Patient still reporting some pain despite IV Dilaudid Hospitalist was called for admission for dehydration poorly controlled cancer-related pain  Review of Systems:    Pertinent positives include: chest pain, body aches  Constitutional:  No weight loss, night sweats, Fevers, chills, fatigue, weight loss  HEENT:  No headaches, Difficulty swallowing,Tooth/dental problems,Sore throat,  No sneezing, itching, ear ache, nasal congestion, post nasal drip,  Cardio-vascular:  No Orthopnea, PND, anasarca, dizziness, palpitations.no Bilateral lower extremity swelling  GI:  No heartburn, indigestion, abdominal pain, nausea, vomiting, diarrhea, change in bowel habits, loss of appetite, melena, blood in stool, hematemesis Resp:  no shortness of breath at rest. No dyspnea on exertion, No excess mucus, no productive cough, No non-productive cough, No coughing up of blood.No change in color of mucus.No wheezing. Skin:  no rash or lesions. No jaundice GU:  no dysuria, change in color of urine, no urgency or  frequency. No straining to urinate.  No flank pain.  Musculoskeletal:  No joint pain or no joint swelling. No decreased range of motion. No back pain.  Psych:  No change in mood or affect. No depression or anxiety. No memory loss.  Neuro: no localizing  neurological complaints, no tingling, no weakness, no double vision, no gait abnormality, no slurred speech, no confusion  As per HPI otherwise 10 point review of systems negative.   Past Medical History: Past Medical History:  Diagnosis Date  . Anemia   . Anxiety    no meds currently  . Breast cancer ([place]) 2015   left upper outer;  had chemo  . Bronchitis 2014  . Complication of anesthesia    makes pt. restless and unable to be still  . Depression    no meds currently  . GERD (gastroesophageal reflux disease)   . Heart murmur    never had any problems as adult  . High cholesterol   . Hypertension   . Radiation 06/23/14-08/11/14   Invasive ductal ca o left breast  . Sarcoidosis ([place])    no problems per patient   . SVD (spontaneous vaginal delivery)    x 2  . Thyroid cancer ([place]) 2015  . Type II diabetes mellitus ([place])    Past Surgical History:  Procedure Laterality Date  . ABDOMINAL HYSTERECTOMY    . BREAST BIOPSY Left 2015 X 3   biopsy  . ENDOBRONCHIAL ULTRASOUND Bilateral 12/02/2012   Procedure: ENDOBRONCHIAL ULTRASOUND;  Surgeon: Collene Gobble, MD;  Location: [facility];  Service: Cardiopulmonary;  Laterality: Bilateral;  . LAPAROSCOPIC ASSISTED VAGINAL HYSTERECTOMY N/A 12/09/2014   Procedure: LAPAROSCOPIC ASSISTED VAGINAL HYSTERECTOMY;  Surgeon: Cheri Fowler, MD;  Location: [facility];  Service: Gynecology;  Laterality: N/A;  . LEEP    . LUNG BIOPSY  2014  . MASTECTOMY W/ SENTINEL NODE BIOPSY Left 04/23/2014   & axillary LND  . MASTECTOMY W/ SENTINEL NODE BIOPSY Left 04/23/2014   Procedure: LEFT BREAST MASTECTOMY WITH SENTINEL LYMPH NODE BIOPSY AND AXILLARY LYMPH NODE DISECTION;  Surgeon: [name], MD;  Location: [facility];  Service: General;  Laterality: Left;  . PORT-A-CATH REMOVAL Left 02/03/2015   Procedure: REMOVAL PORT-A-CATH;  Surgeon: [name], MD;  Location: [facility];  Service: General;  Laterality: Left;  . PORTACATH PLACEMENT N/A 10/22/2013    Procedure: INSERTION PORT-A-CATH;  Surgeon: [name], MD;  Location: [facility];  Service: General;  Laterality: N/A;  . SALPINGOOPHORECTOMY Bilateral 12/09/2014   Procedure: SALPINGO OOPHORECTOMY;  Surgeon: Cheri Fowler, MD;  Location: [facility];  Service: Gynecology;  Laterality: Bilateral;  . THYROID LOBECTOMY Left 04/23/2014   Procedure: LEFT THYROID LOBECTOMY;  Surgeon: [name], MD;  Location: [facility];  Service: General;  Laterality: Left;  . TUBAL LIGATION  2001  . WISDOM TOOTH EXTRACTION       Social History:  Ambulatory  Kasandra Knudsen     reports that she has been smoking Cigarettes.  She has a 6.25 pack-year smoking history. She has never used smokeless tobacco. She reports that she drinks about 6.0 oz of alcohol per week . She reports that she does not use drugs.  Allergies:   Allergies  Allergen Reactions  . Nyquil Multi-Symptom [Pseudoeph-Doxylamine-Dm-Apap] Itching and Other (See Comments)    Reaction:  Skin peeling on hands/feet        Family History:   Family History  Problem Relation Age of Onset  . Cancer Paternal Aunt     "  bone cancer"; deceased 60s  . Cancer Paternal Uncle     stomach cancer; deceased 17s  . Cancer Paternal Aunt     unk. primary; currently 32s  . Asthma Mother   . Prostate cancer Maternal Grandfather 82    Medications: Prior to Admission medications   Medication Sig Start Date End Date Taking? Authorizing Provider  ferrous sulfate 325 (65 FE) MG tablet Take 325 mg by mouth daily with breakfast.    Yes Historical Provider, MD  FLUoxetine (PROZAC) 20 MG tablet Take 20 mg by mouth daily.    Yes Historical Provider, MD  gabapentin (NEURONTIN) 300 MG capsule Take 300-600 mg by mouth 3 (three) times daily. Pt takes one capsule in the morning, one in the afternoon, and two at bedtime.   Yes Historical Provider, MD  HYDROmorphone (DILAUDID) 4 MG tablet Take 1 tablet (4 mg total) by mouth every 2 (two) hours as needed for severe pain. 12/24/15  Yes  Chauncey Cruel, MD  letrozole [facility]) 2.5 MG tablet Take 1 tablet (2.5 mg total) by mouth daily. 08/06/15  Yes Chauncey Cruel, MD  metFORMIN (GLUCOPHAGE) 1000 MG tablet Take 1,000 mg by mouth 2 (two) times daily with a meal.    Yes Historical Provider, MD  methadone (DOLOPHINE) 10 MG tablet Take 20 mg by mouth 4 (four) times daily.   Yes Historical Provider, MD  mometasone (NASONEX) 50 MCG/ACT nasal spray Place 1 spray into the nose 2 (two) times daily.   Yes Historical Provider, MD  omeprazole (PRILOSEC) 40 MG capsule Take 1 capsule (40 mg total) by mouth daily. 10/08/15  Yes Chauncey Cruel, MD  palbociclib [name]) 100 MG capsule Take 1 capsule (100 mg total) by mouth daily with breakfast. Take whole with food. 10/08/15  Yes Chauncey Cruel, MD  polyethylene glycol (MIRALAX / GLYCOLAX) packet Take 17 g by mouth daily.   Yes Historical Provider, MD  prochlorperazine (COMPAZINE) 10 MG tablet Take 1 tablet (10 mg total) by mouth every 6 (six) hours as needed for nausea or vomiting. 12/03/15  Yes Chauncey Cruel, MD    Physical Exam: Patient Vitals for the past 24 hrs:  BP Temp Temp src Pulse Resp SpO2 Height Weight  01/07/16 2230 118/81 - - 83 (!) 28 91 % - -  01/07/16 2200 121/88 - - 81 16 99 % - -  01/07/16 2149 115/82 - - 87 15 100 % - -  01/07/16 2130 115/82 - - 92 23 (!) 89 % - -  01/07/16 2113 166/91 - - 93 16 99 % - -  01/07/16 2100 122/79 - - 89 18 (!) 88 % - -  01/07/16 2030 117/79 - - 88 23 90 % - -  01/07/16 2000 127/80 - - 80 (!) 27 97 % - -  01/07/16 1956 125/70 - - 97 18 99 % - -  01/07/16 1930 125/70 - - 81 17 93 % - -  01/07/16 1900 111/82 - - 80 22 94 % - -  01/07/16 1841 117/82 - - 82 19 91 % 5\' 3"  (1.6 m) 78.5 kg (173 lb)  01/07/16 1801 114/78 - - 85 18 98 % - -  01/07/16 1730 116/83 - - 82 23 96 % - -  01/07/16 1726 - 98.9 F (37.2 C) Rectal - - - - -  01/07/16 1709 126/80 - - 78 17 97 % - -  01/07/16 1635 115/74 - - 90 24 95 % - -  01/07/16 1600 104/78 - -  87 24 93 % - -  01/07/16 1553 106/74 - - 85 16 96 % - -  01/07/16 1453 105/76 98.1 F (36.7 C) Oral 89 16 97 % - -    1. General:  in No Acute distress 2. Psychological: Alert and   Oriented 3. Head/ENT:   Dry Mucous Membranes                          Head Non traumatic, neck supple                          Poor Dentition 4. SKIN:  decreased Skin turgor,  Skin clean Dry and intact no rash 5. Heart: Regular rate and rhythm no  Murmur, Rub or gallop 6. Lungs:  no wheezes or crackles   7. Abdomen: Soft, non-tender, Non distended 8. Lower extremities: no clubbing, cyanosis, or edema 9. Neurologically Grossly intact, moving all 4 extremities equally 10. MSK: Normal range of motion   body mass index is 30.65 kg/m.  Labs on Admission:   Labs on Admission: I have personally reviewed following labs and imaging studies  CBC:  Recent Labs Lab 01/04/16 1532 01/07/16 1509  WBC 2.9* 3.3*  NEUTROABS 2.1 2.5  HGB 8.4* 8.7*  HCT 26.3* 26.8*  MCV 100.0 98.5  PLT 118* 0000000*   Basic Metabolic Panel:  Recent Labs Lab 01/04/16 1533 01/07/16 1509  NA 139 135  K 4.1 3.4*  CL  --  103  CO2 27 24  GLUCOSE 117 185*  BUN 13.2 15  CREATININE 1.1 1.05*  CALCIUM 9.7 8.8*   GFR: Estimated Creatinine Clearance: 66.4 mL/min (by C-G formula based on SCr of 1.05 mg/dL). Liver Function Tests:  Recent Labs Lab 01/04/16 1533 01/07/16 1509  AST 57* 63*  ALT 33 32  ALKPHOS 187* 196*  BILITOT <0.30 0.4  PROT 7.5 8.0  ALBUMIN 3.5 3.9   No results for input(s): LIPASE, AMYLASE in the last 168 hours. No results for input(s): AMMONIA in the last 168 hours. Coagulation Profile: No results for input(s): INR, PROTIME in the last 168 hours. Cardiac Enzymes: No results for input(s): CKTOTAL, CKMB, CKMBINDEX, TROPONINI in the last 168 hours. BNP (last 3 results) No results for input(s): PROBNP in the last 8760 hours. HbA1C: No results for input(s): HGBA1C in the last 72 hours. CBG: No  results for input(s): GLUCAP in the last 168 hours. Lipid Profile: No results for input(s): CHOL, HDL, LDLCALC, TRIG, CHOLHDL, LDLDIRECT in the last 72 hours. Thyroid Function Tests: No results for input(s): TSH, T4TOTAL, FREET4, T3FREE, THYROIDAB in the last 72 hours. Anemia Panel: No results for input(s): VITAMINB12, FOLATE, FERRITIN, TIBC, IRON, RETICCTPCT in the last 72 hours. Urine analysis:    Component Value Date/Time   COLORURINE YELLOW 01/07/2016 1450   APPEARANCEUR CLEAR 01/07/2016 1450   LABSPEC 1.014 01/07/2016 1450   PHURINE 6.5 01/07/2016 1450   GLUCOSEU NEGATIVE 01/07/2016 1450   HGBUR NEGATIVE 01/07/2016 1450   BILIRUBINUR NEGATIVE 01/07/2016 1450   KETONESUR NEGATIVE 01/07/2016 1450   PROTEINUR NEGATIVE 01/07/2016 1450   UROBILINOGEN 0.2 01/04/2012 0246   NITRITE NEGATIVE 01/07/2016 1450   LEUKOCYTESUR NEGATIVE 01/07/2016 1450   Sepsis Labs: @LABRCNTIP (procalcitonin:4,lacticidven:4) ) Recent Results (from the past 240 hour(s))  Culture, blood (Routine X 2)     Status: None (Preliminary result)   Collection Time: 01/07/16  3:09 PM  Result Value  Ref Range Status   Specimen Description BLOOD RIGHT ANTECUBITAL  Final   Special Requests BOTTLES DRAWN AEROBIC AND ANAEROBIC 7ML  Final   Culture   Final    NO GROWTH <12 HOURS Performed at [facility]    Report Status PENDING  Incomplete  Culture, blood (Routine X 2)     Status: None (Preliminary result)   Collection Time: 01/07/16  3:29 PM  Result Value Ref Range Status   Specimen Description BLOOD RIGHT HAND  Final   Special Requests BOTTLES DRAWN AEROBIC AND ANAEROBIC 5ML  Final   Culture   Final    NO GROWTH <12 HOURS Performed at [facility]    Report Status PENDING  Incomplete      UA   no evidence of UTI  Lab Results  Component Value Date   HGBA1C 6.3 (H) 04/06/2010    Estimated Creatinine Clearance: 66.4 mL/min (by C-G formula based on SCr of 1.05 mg/dL).  BNP (last 3  results) No results for input(s): PROBNP in the last 8760 hours.   ECG REPORT  Independently reviewed Rate: 88  Rhythm: Sinus rhythm ST&T Change: No acute ischemic changes   [id number]  Filed Weights   01/07/16 1841  Weight: 78.5 kg (173 lb)     Cultures:    Component Value Date/Time   SDES BLOOD RIGHT HAND 01/07/2016 1529   SPECREQUEST BOTTLES DRAWN AEROBIC AND ANAEROBIC 5ML 01/07/2016 1529   CULT  01/07/2016 1529    NO GROWTH <12 HOURS Performed at [facility] PENDING 01/07/2016 1529     Radiological Exams on Admission: Dg Chest 2 View  Result Date: 01/07/2016 CLINICAL DATA:  45 year old current history of metastatic breast cancer for which the patient is currently undergoing chemotherapy. Generalized body aches, most severe involving the back, ribs and chest. EXAM: CHEST  2 VIEW COMPARISON:  CT chest 08/03/2015 and earlier. Chest x-rays 07/21/2015 and earlier. FINDINGS: AP semi-erect and lateral images were obtained. Cardiac silhouette normal in size, unchanged. Right hilar lymphadenopathy, somewhat less prominent than on the February, 2017 CT. Lungs clear. Bronchovascular markings normal. Pulmonary vascularity normal. No visible pleural effusions. No pneumothorax. Previously identified osseous metastases involving the ribs and lower right scapula is less conspicuous on the x-ray. Previously identified metastasis involving the distal right clavicle is partially visualized with an associated pathologic fracture. IMPRESSION: 1.  No acute cardiopulmonary disease. 2. Pathologic fracture involving the distal right clavicle. Other widespread osseous metastatic disease previously identified on bone scan and CT is less conspicuous on the x-ray appear Electronically Signed   By: Evangeline Dakin M.D.   On: 01/07/2016 16:02    Chart has been reviewed   Assessment/Plan  45 y.o. female with medical history significant of DM, metastatic breast cancer with spread to bone,  sarcoidosis, HTN, chronic anemia, GERD thyroid mass showed no evidence of malignancy admitted for dehydration and intractable pain related to metastatic disease      Present on Admission: . Pain from bone metastases ([place]) -continue methadone use Dilaudid PCA and increased doses needed. Palliative care consult as per recommendation of oncology will use IV Decadron 40 IV now and 20 daily . Anemia, unspecified - chronic likely secondary to anemia of chronic disease secondary to cancer Dm 2 - sliding scale ordered hold metformin in case she is any contrasted studies Hypertension stable continue to monitor Hypokalemia - mild will replace Other plan as per orders.  DVT prophylaxis:    Lovenox  Code Status:  FULL CODE  as per patient    Family Communication:   Family   at  Bedside  plan of care was discussed with   Son Michel Harrow- Micheli [phone]   Disposition Plan:     To home once workup is complete and patient is stable states she is interested in palliative care consult  Consults called: oncology   Admission status: obs    Level of care        medical floor         I have spent a total of 68 min on this admission  extra time was spent to discuss case with oncology and pharmacy  [place] 01/07/2016, 11:59 PM    [facility]  Pager [phone]   after 2 AM please page floor coverage PA If 7AM-7PM, please contact the day team taking care of the patient  [email or web address]  Password [email or web address]

## 2016-01-07 NOTE — ED Notes (Signed)
Pt comes in with c/o generalized body aches.Pain most severe in back, ribs, and chest p/t rating pain 8/10 usual pain with medication regimen is approx 4/10. Pt has stage 4 bone cancer and chemo last end date approx 9 days ago. Pt denies SOB, or hx of cardiac problems. No fever noted, vitals WDL.

## 2016-01-07 NOTE — ED Notes (Signed)
Pt aware urine sample needed. Cannot produce at this time-will recheck.

## 2016-01-07 NOTE — ED Notes (Signed)
PA at bedside.

## 2016-01-07 NOTE — ED Provider Notes (Signed)
[facility] DEPT Provider Note   CSN: [id number] Arrival date & time: 01/07/16  1441  First Provider Contact:  None       History   Chief Complaint Chief Complaint  Patient presents with  . Generalized Body Aches  . Chemotherapy    HPI Shelly Hall is a 45 y.o. female.  HPI   Patient is a 45 year old female with history of stage IIIa invasive ductal carcinoma, grade 2, estrogen and progesterone receptor positive, her-2 negative diagnosis in 2015, S/P left mastectomy, with metastases to her spine and pelvis in March 2017 on Ibrance chemo PO presents our department with Arthralgias (knees and ankles), fatigue, intermittent nausea, and uncontrolled lower back, chest, and rib pain for 2-3 days. Patient is on extensive pain regimen. She states her pain meds are not helping her pain. She states her lower back pain is the same as her normal pain but worse, 8/10. She states the chest pain and the bilateral rib pain is new, 8/10, worse with movement. Pt denies SOB, fevers, chills, abdominal pain, vomiting, diarrhea, syncope, visual changes, dysuria, hematuria, hematochezia, headache, sore throat, cough.  Past Medical History:  Diagnosis Date  . Anemia   . Anxiety    no meds currently  . Breast cancer ([place]) 2015   left upper outer;  had chemo  . Bronchitis 2014  . Complication of anesthesia    makes pt. restless and unable to be still  . Depression    no meds currently  . GERD (gastroesophageal reflux disease)   . Heart murmur    never had any problems as adult  . High cholesterol   . Hypertension   . Radiation 06/23/14-08/11/14   Invasive ductal ca o left breast  . Sarcoidosis ([place])    no problems per patient   . SVD (spontaneous vaginal delivery)    x 2  . Thyroid cancer ([place]) 2015  . Type II diabetes mellitus [facility])     Patient Active Problem List   Diagnosis Date Noted  . Uncontrolled pain 01/07/2016  . Intractable pain   . Palliative care encounter   .  Diverticula of colon 09/28/2015  . Pain 09/28/2015  . Encounter for palliative care   . Metastatic cancer ([place]) 09/18/2015  . Cancer associated pain 09/18/2015  . Pain from bone metastases ([place]) 08/27/2015  . Pain due to malignant neoplasm metastatic to bone ([place]) 08/06/2015  . Genetic testing 04/09/2015  . Lymphedema of arm 02/18/2015  . S/P hysterectomy 12/09/2014  . Lymphedema 11/17/2014  . Gum symptoms 03/18/2014  . Peripheral neuropathy due to chemotherapy ([place]) 01/22/2014  . Hot flashes 01/15/2014  . 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 Esophageal reflux 11/27/2013  . 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 Recurrent genital herpes 11/06/2013  . Chemotherapy induced neutropenia ([place]) 11/06/2013  . Anemia, unspecified 11/06/2013  . Diabetes mellitus type II, non insulin dependent ([place]) 11/06/2013  . Left thyroid nodule 11/06/2013  . Sarcoidosis ([place]) 10/15/2013  . Breast cancer of upper-outer quadrant of left female breast ([place]) 10/10/2013  . Mediastinal lymphadenopathy 11/21/2012  . Tobacco use disorder 11/21/2012    Past Surgical History:  Procedure Laterality Date  . ABDOMINAL HYSTERECTOMY    . BREAST BIOPSY Left 2015 X 3   biopsy  . ENDOBRONCHIAL ULTRASOUND Bilateral 12/02/2012   Procedure: ENDOBRONCHIAL ULTRASOUND;  Surgeon: Collene Gobble, MD;  Location: [facility];  Service: Cardiopulmonary;  Laterality: Bilateral;  . LAPAROSCOPIC ASSISTED VAGINAL HYSTERECTOMY N/A 12/09/2014  Procedure: LAPAROSCOPIC ASSISTED VAGINAL HYSTERECTOMY;  Surgeon: Cheri Fowler, MD;  Location: [facility];  Service: Gynecology;  Laterality: N/A;  . LEEP    . LUNG BIOPSY  2014  . MASTECTOMY W/ SENTINEL NODE BIOPSY Left 04/23/2014   & axillary LND  . MASTECTOMY W/ SENTINEL NODE BIOPSY Left 04/23/2014   Procedure: LEFT BREAST MASTECTOMY WITH SENTINEL LYMPH NODE BIOPSY AND AXILLARY LYMPH NODE DISECTION;  Surgeon: [name], MD;  Location: [facility];  Service: General;  Laterality: Left;   . PORT-A-CATH REMOVAL Left 02/03/2015   Procedure: REMOVAL PORT-A-CATH;  Surgeon: [name], MD;  Location: [facility];  Service: General;  Laterality: Left;  . PORTACATH PLACEMENT N/A 10/22/2013   Procedure: INSERTION PORT-A-CATH;  Surgeon: [name], MD;  Location: [facility];  Service: General;  Laterality: N/A;  . SALPINGOOPHORECTOMY Bilateral 12/09/2014   Procedure: SALPINGO OOPHORECTOMY;  Surgeon: Cheri Fowler, MD;  Location: [facility];  Service: Gynecology;  Laterality: Bilateral;  . THYROID LOBECTOMY Left 04/23/2014   Procedure: LEFT THYROID LOBECTOMY;  Surgeon: [name], MD;  Location: [facility];  Service: General;  Laterality: Left;  . TUBAL LIGATION  2001  . WISDOM TOOTH EXTRACTION      OB History    No data available       Home Medications    Prior to Admission medications   Medication Sig Start Date End Date Taking? Authorizing Provider  ferrous sulfate 325 (65 FE) MG tablet Take 325 mg by mouth daily with breakfast.    Yes Historical Provider, MD  FLUoxetine (PROZAC) 20 MG tablet Take 20 mg by mouth daily.    Yes Historical Provider, MD  gabapentin (NEURONTIN) 300 MG capsule Take 300-600 mg by mouth 3 (three) times daily. Pt takes one capsule in the morning, one in the afternoon, and two at bedtime.   Yes Historical Provider, MD  HYDROmorphone (DILAUDID) 4 MG tablet Take 1 tablet (4 mg total) by mouth every 2 (two) hours as needed for severe pain. 12/24/15  Yes Chauncey Cruel, MD  letrozole [facility]) 2.5 MG tablet Take 1 tablet (2.5 mg total) by mouth daily. 08/06/15  Yes Chauncey Cruel, MD  metFORMIN (GLUCOPHAGE) 1000 MG tablet Take 1,000 mg by mouth 2 (two) times daily with a meal.    Yes Historical Provider, MD  methadone (DOLOPHINE) 10 MG tablet Take 20 mg by mouth 4 (four) times daily.   Yes Historical Provider, MD  mometasone (NASONEX) 50 MCG/ACT nasal spray Place 1 spray into the nose 2 (two) times daily.   Yes Historical Provider, MD  omeprazole  (PRILOSEC) 40 MG capsule Take 1 capsule (40 mg total) by mouth daily. 10/08/15  Yes Chauncey Cruel, MD  palbociclib [name]) 100 MG capsule Take 1 capsule (100 mg total) by mouth daily with breakfast. Take whole with food. 10/08/15  Yes Chauncey Cruel, MD  polyethylene glycol (MIRALAX / GLYCOLAX) packet Take 17 g by mouth daily.   Yes Historical Provider, MD  prochlorperazine (COMPAZINE) 10 MG tablet Take 1 tablet (10 mg total) by mouth every 6 (six) hours as needed for nausea or vomiting. 12/03/15  Yes Chauncey Cruel, MD    Family History Family History  Problem Relation Age of Onset  . Cancer Paternal Aunt     "bone cancer"; deceased 40s  . Cancer Paternal Uncle     stomach cancer; deceased 37s  . Cancer Paternal Aunt     unk. primary; currently 21s  . Asthma Mother   .  Prostate cancer Maternal Grandfather 61    Social History Social History  Substance Use Topics  . Smoking status: Current Some Day Smoker    Packs/day: 0.25    Years: 25.00    Types: Cigarettes  . Smokeless tobacco: Never Used     Comment: 04/23/2014 "using vapor; down to 3-4 cigarettes/day now"  . Alcohol use 6.0 oz/week    4 Cans of beer, 4 Shots of liquor, 2 Standard drinks or equivalent per week     Allergies   Nyquil multi-symptom [pseudoeph-doxylamine-dm-apap]   Review of Systems Review of Systems  Constitutional: Positive for fatigue. Negative for chills, diaphoresis and fever.  HENT: Negative for sore throat.   Eyes: Negative for discharge, redness and visual disturbance.  Respiratory: Negative for cough, shortness of breath and wheezing.   Cardiovascular: Positive for chest pain.  Gastrointestinal: Positive for nausea. Negative for abdominal distention, abdominal pain, blood in stool, diarrhea and vomiting.  Genitourinary: Negative for difficulty urinating, dysuria and hematuria.  Musculoskeletal: Positive for arthralgias and back pain. Negative for joint swelling, myalgias and neck pain.    Skin: Negative for rash.  Allergic/Immunologic: Positive for immunocompromised state.  Neurological: Negative for syncope, weakness and headaches.  Psychiatric/Behavioral: Negative for confusion.     Physical Exam Updated Vital Signs BP 105/76   Pulse 89   Temp 98.1 F (36.7 C) (Oral)   Resp 16   LMP 12/07/2014 Comment: spotting  SpO2 97%   Physical Exam  Constitutional: She appears well-developed and well-nourished. No distress.  HENT:  Head: Normocephalic and atraumatic.  Eyes: Conjunctivae are normal.  Cardiovascular: Normal rate and regular rhythm.  Exam reveals no gallop and no friction rub.   Murmur heard.  Systolic murmur is present  Pulses:      Radial pulses are 2+ on the right side, and 2+ on the left side.       Dorsalis pedis pulses are 2+ on the right side, and 2+ on the left side.  Pulmonary/Chest: Effort normal and breath sounds normal. No respiratory distress. She has no decreased breath sounds. She has no wheezes. She has no rhonchi. She has no rales.     She exhibits tenderness. She exhibits no crepitus.    Abdominal: Soft. Normal appearance and bowel sounds are normal. She exhibits no distension. There is generalized tenderness. There is no rigidity, no rebound, no guarding and no CVA tenderness.  Musculoskeletal: Normal range of motion. She exhibits no edema.  Neurological: She is alert. Coordination normal.  Skin: Skin is warm and dry. No rash noted. She is not diaphoretic.  Psychiatric: She has a normal mood and affect. Her behavior is normal.  Nursing note and vitals reviewed.    ED Treatments / Results  Labs (all labs ordered are listed, but only abnormal results are displayed) Labs Reviewed  COMPREHENSIVE METABOLIC PANEL - Abnormal; Notable for the following:       Result Value   Potassium 3.4 (*)    Glucose, Bld 185 (*)    Creatinine, Ser 1.05 (*)    Calcium 8.8 (*)    AST 63 (*)    Alkaline Phosphatase 196 (*)    All other  components within normal limits  CBC WITH DIFFERENTIAL/PLATELET - Abnormal; Notable for the following:    WBC 3.3 (*)    RBC 2.72 (*)    Hemoglobin 8.7 (*)    HCT 26.8 (*)    RDW 19.1 (*)    Platelets 109 (*)    Lymphs  Abs 0.4 (*)    All other components within normal limits  CULTURE, BLOOD (ROUTINE X 2)  CULTURE, BLOOD (ROUTINE X 2)  URINE CULTURE  URINALYSIS, ROUTINE W REFLEX MICROSCOPIC (NOT AT [facility])  MAGNESIUM  PHOSPHORUS  TSH  COMPREHENSIVE METABOLIC PANEL  CBC  I-STAT CG4 LACTIC ACID, ED  Randolm Idol, ED    EKG  EKG Interpretation  Date/Time:  Friday January 07 2016 16:28:02 EDT Ventricular Rate:  88 PR Interval:    QRS Duration: 90 QT Interval:  397 QTC Calculation: 481 R Axis:   -11 Text Interpretation:  Sinus rhythm Left ventricular hypertrophy Borderline T abnormalities, diffuse leads , new since last tracing Confirmed by KNAPP  MD-J, JON ([id number]) on 01/07/2016 4:35:46 PM       Radiology Dg Chest 2 View  Result Date: 01/07/2016 CLINICAL DATA:  45 year old current history of metastatic breast cancer for which the patient is currently undergoing chemotherapy. Generalized body aches, most severe involving the back, ribs and chest. EXAM: CHEST  2 VIEW COMPARISON:  CT chest 08/03/2015 and earlier. Chest x-rays 07/21/2015 and earlier. FINDINGS: AP semi-erect and lateral images were obtained. Cardiac silhouette normal in size, unchanged. Right hilar lymphadenopathy, somewhat less prominent than on the February, 2017 CT. Lungs clear. Bronchovascular markings normal. Pulmonary vascularity normal. No visible pleural effusions. No pneumothorax. Previously identified osseous metastases involving the ribs and lower right scapula is less conspicuous on the x-ray. Previously identified metastasis involving the distal right clavicle is partially visualized with an associated pathologic fracture. IMPRESSION: 1.  No acute cardiopulmonary disease. 2. Pathologic fracture involving the  distal right clavicle. Other widespread osseous metastatic disease previously identified on bone scan and CT is less conspicuous on the x-ray appear Electronically Signed   By: Evangeline Dakin M.D.   On: 01/07/2016 16:02    Procedures Procedures (including critical care time)  Medications Ordered in ED Medications  gabapentin (NEURONTIN) capsule 300 mg (not administered)  methadone (DOLOPHINE) tablet 20 mg (not administered)  polyethylene glycol (MIRALAX / GLYCOLAX) packet 17 g (not administered)  prochlorperazine (COMPAZINE) tablet 10 mg (not administered)  pantoprazole (PROTONIX) EC tablet 40 mg (not administered)  letrozole (FEMARA) tablet 2.5 mg (not administered)  FLUoxetine (PROZAC) capsule 20 mg (not administered)  ferrous sulfate tablet 325 mg (not administered)  ondansetron (ZOFRAN) tablet 4 mg (not administered)    Or  ondansetron (ZOFRAN) injection 4 mg (not administered)  insulin aspart (novoLOG) injection 0-5 Units (not administered)  enoxaparin (LOVENOX) injection 40 mg (not administered)  senna (SENOKOT) tablet 8.6 mg (not administered)  bisacodyl (DULCOLAX) suppository 10 mg (not administered)  sodium phosphate (FLEET) 7-19 GM/118ML enema 1 enema (not administered)  thiamine (VITAMIN B-1) tablet 100 mg (not administered)  insulin aspart (novoLOG) injection 0-15 Units (not administered)  naloxone (NARCAN) injection 0.4 mg (not administered)    And  sodium chloride flush (NS) 0.9 % injection 9 mL (not administered)  ondansetron (ZOFRAN) injection 4 mg (not administered)  HYDROmorphone (DILAUDID) 1 mg/mL PCA injection (not administered)  dexamethasone (DECADRON) injection 40 mg (not administered)  dexamethasone (DECADRON) injection 20 mg (not administered)  gabapentin (NEURONTIN) capsule 600 mg (not administered)  HYDROmorphone (DILAUDID) injection 2 mg (2 mg Intravenous Given 01/07/16 1601)  methadone (DOLOPHINE) tablet 20 mg (20 mg Oral Given 01/07/16 1719)  sodium  chloride 0.9 % bolus 1,000 mL (0 mLs Intravenous Stopped 01/07/16 2136)  potassium chloride SA (K-DUR,KLOR-CON) CR tablet 20 mEq (20 mEq Oral Given 01/07/16 1736)  HYDROmorphone (DILAUDID) injection 2 mg (  2 mg Intravenous Given 01/07/16 1815)  HYDROmorphone (DILAUDID) injection 2 mg (2 mg Intravenous Given 01/07/16 2134)     Initial Impression / Assessment and Plan / ED Course  I have reviewed the triage vital signs and the nursing notes.  Pertinent labs & imaging results that were available during my care of the patient were reviewed by me and considered in my medical decision making (see chart for details).  Clinical Course  Value Comment By Time  Comment 3:        (Reviewed) Kalman Drape, PA 08/04 2244   5:14 PM Will give pt home methadone dose and another 42m dilaudid at 6pm. Will try to control her pain and if unsuccessful may admit pt for pain control. Pt states she has not had a discussion with her oncologist regarding plan if her current pain regiment fails. Discussed results of chest xray with pt that she has a pathological clavicle fracture. Pt is requesting fluids as she feels as she may be dehydrated. I agreed to give her a bolus of fluids. She is also slightly hypokalemic so will give PO K in the ED.   8:00pm pain is still not well controlled, patient is requesting another dose of Dilaudid. We'll reassess her at 10 PM  10:00pm patient states pain is still not well controlled. We'll give another dose of Dilaudid. Discussed the options with the patient of admission versus going home. Discussed with the patient that if she is admitted to the hospital and will increase her chance of infection but we also need to control her pain. Patient has requested admission due to poor pain control.  I will consult the hospitalist team to admit the patient for uncontrolled pain.  Patient with stage IV metastatic breast cancer. Multiple new complaints of pain and states her pain regiment is no longer  controlling her pain. Chest x-ray revealed a pathological right clavicle fracture. Patient states clavicle pain started roughly 2 weeks ago. Her new onset bone pain could likely be 2/2 her metastatic disease.  Labs relatively unchanged from 3 days ago. Patient afebrile, not neutropenic at this time. Blood cultures pending. Negative troponin, chest x-ray negative for infiltrates.  Pt will be admitted by Dr. DRoel Cluckfor further evaluation and treatment. Thank you Dr. DRoel Cluckfor your consult and care of this patient.  Final Clinical Impressions(s) / ED Diagnoses   Final diagnoses:  Rib pain  Arthralgia  Chest pain, unspecified chest pain type    New Prescriptions Current Discharge Medication List       JKalman Drape PUtah08/05/17 0036    JDorie Rank MD 01/11/16 0800

## 2016-01-07 NOTE — ED Notes (Signed)
Patient transported to X-ray 

## 2016-01-08 ENCOUNTER — Encounter (HOSPITAL_COMMUNITY): Payer: Self-pay

## 2016-01-08 DIAGNOSIS — G893 Neoplasm related pain (acute) (chronic): Principal | ICD-10-CM

## 2016-01-08 DIAGNOSIS — R52 Pain, unspecified: Secondary | ICD-10-CM | POA: Diagnosis not present

## 2016-01-08 DIAGNOSIS — R0602 Shortness of breath: Secondary | ICD-10-CM | POA: Diagnosis not present

## 2016-01-08 DIAGNOSIS — K219 Gastro-esophageal reflux disease without esophagitis: Secondary | ICD-10-CM | POA: Diagnosis present

## 2016-01-08 DIAGNOSIS — E876 Hypokalemia: Secondary | ICD-10-CM | POA: Diagnosis present

## 2016-01-08 DIAGNOSIS — E119 Type 2 diabetes mellitus without complications: Secondary | ICD-10-CM | POA: Diagnosis not present

## 2016-01-08 DIAGNOSIS — D649 Anemia, unspecified: Secondary | ICD-10-CM

## 2016-01-08 DIAGNOSIS — C419 Malignant neoplasm of bone and articular cartilage, unspecified: Secondary | ICD-10-CM | POA: Diagnosis not present

## 2016-01-08 DIAGNOSIS — R0781 Pleurodynia: Secondary | ICD-10-CM | POA: Diagnosis not present

## 2016-01-08 DIAGNOSIS — C7951 Secondary malignant neoplasm of bone: Secondary | ICD-10-CM

## 2016-01-08 DIAGNOSIS — D696 Thrombocytopenia, unspecified: Secondary | ICD-10-CM

## 2016-01-08 DIAGNOSIS — D61810 Antineoplastic chemotherapy induced pancytopenia: Secondary | ICD-10-CM | POA: Diagnosis not present

## 2016-01-08 DIAGNOSIS — Z789 Other specified health status: Secondary | ICD-10-CM | POA: Diagnosis not present

## 2016-01-08 DIAGNOSIS — M2550 Pain in unspecified joint: Secondary | ICD-10-CM | POA: Diagnosis not present

## 2016-01-08 DIAGNOSIS — E860 Dehydration: Secondary | ICD-10-CM | POA: Diagnosis present

## 2016-01-08 DIAGNOSIS — N764 Abscess of vulva: Secondary | ICD-10-CM | POA: Diagnosis present

## 2016-01-08 DIAGNOSIS — Z9221 Personal history of antineoplastic chemotherapy: Secondary | ICD-10-CM | POA: Diagnosis not present

## 2016-01-08 DIAGNOSIS — C50912 Malignant neoplasm of unspecified site of left female breast: Secondary | ICD-10-CM | POA: Diagnosis not present

## 2016-01-08 DIAGNOSIS — F17210 Nicotine dependence, cigarettes, uncomplicated: Secondary | ICD-10-CM | POA: Diagnosis present

## 2016-01-08 DIAGNOSIS — R530 Neoplastic (malignant) related fatigue: Secondary | ICD-10-CM | POA: Diagnosis not present

## 2016-01-08 DIAGNOSIS — E7800 Pure hypercholesterolemia, unspecified: Secondary | ICD-10-CM | POA: Diagnosis present

## 2016-01-08 DIAGNOSIS — K5900 Constipation, unspecified: Secondary | ICD-10-CM | POA: Diagnosis not present

## 2016-01-08 DIAGNOSIS — R61 Generalized hyperhidrosis: Secondary | ICD-10-CM | POA: Diagnosis not present

## 2016-01-08 DIAGNOSIS — I10 Essential (primary) hypertension: Secondary | ICD-10-CM | POA: Diagnosis present

## 2016-01-08 DIAGNOSIS — D869 Sarcoidosis, unspecified: Secondary | ICD-10-CM | POA: Diagnosis present

## 2016-01-08 DIAGNOSIS — L0292 Furuncle, unspecified: Secondary | ICD-10-CM | POA: Diagnosis present

## 2016-01-08 DIAGNOSIS — N949 Unspecified condition associated with female genital organs and menstrual cycle: Secondary | ICD-10-CM | POA: Diagnosis not present

## 2016-01-08 DIAGNOSIS — R011 Cardiac murmur, unspecified: Secondary | ICD-10-CM | POA: Diagnosis present

## 2016-01-08 DIAGNOSIS — D638 Anemia in other chronic diseases classified elsewhere: Secondary | ICD-10-CM | POA: Diagnosis present

## 2016-01-08 DIAGNOSIS — M84411A Pathological fracture, right shoulder, initial encounter for fracture: Secondary | ICD-10-CM | POA: Diagnosis not present

## 2016-01-08 DIAGNOSIS — C50812 Malignant neoplasm of overlapping sites of left female breast: Secondary | ICD-10-CM

## 2016-01-08 DIAGNOSIS — R000 Tachycardia, unspecified: Secondary | ICD-10-CM | POA: Diagnosis not present

## 2016-01-08 DIAGNOSIS — R140 Abdominal distension (gaseous): Secondary | ICD-10-CM | POA: Diagnosis not present

## 2016-01-08 DIAGNOSIS — D61818 Other pancytopenia: Secondary | ICD-10-CM | POA: Diagnosis not present

## 2016-01-08 DIAGNOSIS — C50919 Malignant neoplasm of unspecified site of unspecified female breast: Secondary | ICD-10-CM | POA: Diagnosis not present

## 2016-01-08 LAB — GLUCOSE, CAPILLARY
GLUCOSE-CAPILLARY: 103 mg/dL — AB (ref 65–99)
Glucose-Capillary: 218 mg/dL — ABNORMAL HIGH (ref 65–99)
Glucose-Capillary: 247 mg/dL — ABNORMAL HIGH (ref 65–99)

## 2016-01-08 LAB — CBC
HCT: 23.9 % — ABNORMAL LOW (ref 36.0–46.0)
Hemoglobin: 7.7 g/dL — ABNORMAL LOW (ref 12.0–15.0)
MCH: 31.8 pg (ref 26.0–34.0)
MCHC: 32.2 g/dL (ref 30.0–36.0)
MCV: 98.8 fL (ref 78.0–100.0)
PLATELETS: 102 10*3/uL — AB (ref 150–400)
RBC: 2.42 MIL/uL — AB (ref 3.87–5.11)
RDW: 19.2 % — AB (ref 11.5–15.5)
WBC: 3.3 10*3/uL — AB (ref 4.0–10.5)

## 2016-01-08 LAB — MAGNESIUM: Magnesium: 1.9 mg/dL (ref 1.7–2.4)

## 2016-01-08 LAB — PHOSPHORUS: Phosphorus: 4.2 mg/dL (ref 2.5–4.6)

## 2016-01-08 LAB — COMPREHENSIVE METABOLIC PANEL
ALK PHOS: 173 U/L — AB (ref 38–126)
ALT: 30 U/L (ref 14–54)
AST: 51 U/L — AB (ref 15–41)
Albumin: 3.6 g/dL (ref 3.5–5.0)
Anion gap: 8 (ref 5–15)
BUN: 13 mg/dL (ref 6–20)
CALCIUM: 9.1 mg/dL (ref 8.9–10.3)
CO2: 24 mmol/L (ref 22–32)
CREATININE: 1.03 mg/dL — AB (ref 0.44–1.00)
Chloride: 107 mmol/L (ref 101–111)
Glucose, Bld: 150 mg/dL — ABNORMAL HIGH (ref 65–99)
Potassium: 4 mmol/L (ref 3.5–5.1)
Sodium: 139 mmol/L (ref 135–145)
Total Bilirubin: 0.4 mg/dL (ref 0.3–1.2)
Total Protein: 6.9 g/dL (ref 6.5–8.1)

## 2016-01-08 LAB — URINE CULTURE

## 2016-01-08 LAB — TSH: TSH: 0.942 u[IU]/mL (ref 0.350–4.500)

## 2016-01-08 MED ORDER — SODIUM CHLORIDE 0.9% FLUSH: 9.0000 mL | INTRAVENOUS | Status: DC | PRN

## 2016-01-08 MED ORDER — HYDROMORPHONE 1 MG/ML IV SOLN
INTRAVENOUS | Status: DC
  Administered 2016-01-08: 01:00:00 via INTRAVENOUS
  Administered 2016-01-08 (×2): 1.5 mg via INTRAVENOUS
  Administered 2016-01-08: 0.6 mg via INTRAVENOUS
  Administered 2016-01-08: 3.3 mg via INTRAVENOUS
  Administered 2016-01-08: 3 mg via INTRAVENOUS
  Administered 2016-01-09: 1.89 mg via INTRAVENOUS
  Administered 2016-01-09: 2.4 mg via INTRAVENOUS
  Administered 2016-01-09: 1.5 mg via INTRAVENOUS
  Administered 2016-01-09: 1.8 mg via INTRAVENOUS
  Administered 2016-01-09: 0.6 mg via INTRAVENOUS
  Administered 2016-01-09: 0.3 mg via INTRAVENOUS
  Administered 2016-01-10: 07:00:00 via INTRAVENOUS
  Administered 2016-01-10: 3 mg via INTRAVENOUS
  Administered 2016-01-10: 3.2 mg via INTRAVENOUS
  Administered 2016-01-10: 0.9 mg via INTRAVENOUS
  Administered 2016-01-10: 3 mg via INTRAVENOUS
  Administered 2016-01-10: 2.5 mg via INTRAVENOUS
  Administered 2016-01-10: 1.5 mg via INTRAVENOUS
  Administered 2016-01-11: 0.9 mg via INTRAVENOUS
  Administered 2016-01-11: 3.3 mg via INTRAVENOUS
  Administered 2016-01-11: 4.8 mg via INTRAVENOUS
  Administered 2016-01-11: 3.3 mg via INTRAVENOUS
  Filled 2016-01-08 (×2): qty 25

## 2016-01-08 MED ORDER — PROCHLORPERAZINE MALEATE 10 MG PO TABS
10.0000 mg | ORAL_TABLET | Freq: Four times a day (QID) | ORAL | Status: DC | PRN
  Administered 2016-01-13: 10 mg via ORAL
  Filled 2016-01-08: qty 1

## 2016-01-08 MED ORDER — FLUOXETINE HCL 20 MG PO CAPS
20.0000 mg | ORAL_CAPSULE | Freq: Every day | ORAL | Status: DC
Start: 2016-01-08 — End: 2016-01-18
  Administered 2016-01-08 – 2016-01-18 (×11): 20 mg via ORAL
  Filled 2016-01-08 (×11): qty 1

## 2016-01-08 MED ORDER — VITAMIN B-1 100 MG PO TABS
100.0000 mg | ORAL_TABLET | Freq: Every day | ORAL | Status: DC
  Administered 2016-01-08 – 2016-01-18 (×11): 100 mg via ORAL
  Filled 2016-01-08 (×11): qty 1

## 2016-01-08 MED ORDER — DEXAMETHASONE SODIUM PHOSPHATE 10 MG/ML IJ SOLN
20.0000 mg | INTRAMUSCULAR | Status: DC
  Administered 2016-01-08 – 2016-01-17 (×9): 20 mg via INTRAVENOUS
  Filled 2016-01-08 (×10): qty 2

## 2016-01-08 MED ORDER — INSULIN ASPART 100 UNIT/ML ~~LOC~~ SOLN
0.0000 [IU] | Freq: Three times a day (TID) | SUBCUTANEOUS | Status: DC
  Administered 2016-01-09: 5 [IU] via SUBCUTANEOUS
  Administered 2016-01-09: 11 [IU] via SUBCUTANEOUS
  Administered 2016-01-10: 8 [IU] via SUBCUTANEOUS
  Administered 2016-01-10: 5 [IU] via SUBCUTANEOUS
  Administered 2016-01-11: 3 [IU] via SUBCUTANEOUS
  Administered 2016-01-11 – 2016-01-12 (×3): 5 [IU] via SUBCUTANEOUS
  Administered 2016-01-13: 3 [IU] via SUBCUTANEOUS
  Administered 2016-01-13: 2 [IU] via SUBCUTANEOUS
  Administered 2016-01-13: 5 [IU] via SUBCUTANEOUS
  Administered 2016-01-14: 3 [IU] via SUBCUTANEOUS
  Administered 2016-01-14: 15 [IU] via SUBCUTANEOUS
  Administered 2016-01-14 – 2016-01-15 (×2): 3 [IU] via SUBCUTANEOUS
  Administered 2016-01-15: 2 [IU] via SUBCUTANEOUS
  Administered 2016-01-15: 15 [IU] via SUBCUTANEOUS
  Administered 2016-01-17: 11 [IU] via SUBCUTANEOUS
  Administered 2016-01-17: 8 [IU] via SUBCUTANEOUS
  Administered 2016-01-18: 11 [IU] via SUBCUTANEOUS
  Administered 2016-01-18: 3 [IU] via SUBCUTANEOUS

## 2016-01-08 MED ORDER — NALOXONE HCL 0.4 MG/ML IJ SOLN: 0.4000 mg | INTRAMUSCULAR | Status: DC | PRN

## 2016-01-08 MED ORDER — DIPHENHYDRAMINE HCL 12.5 MG/5ML PO ELIX: 12.5000 mg | ORAL_SOLUTION | Freq: Four times a day (QID) | ORAL | Status: DC | PRN

## 2016-01-08 MED ORDER — ONDANSETRON HCL 4 MG/2ML IJ SOLN: 4.0000 mg | Freq: Four times a day (QID) | INTRAMUSCULAR | Status: DC | PRN

## 2016-01-08 MED ORDER — POLYETHYLENE GLYCOL 3350 17 G PO PACK
17.0000 g | PACK | Freq: Every day | ORAL | Status: DC
  Administered 2016-01-08 – 2016-01-18 (×11): 17 g via ORAL
  Filled 2016-01-08 (×12): qty 1

## 2016-01-08 MED ORDER — INSULIN ASPART 100 UNIT/ML ~~LOC~~ SOLN
0.0000 [IU] | Freq: Every day | SUBCUTANEOUS | Status: DC
  Administered 2016-01-08 – 2016-01-09 (×2): 2 [IU] via SUBCUTANEOUS
  Administered 2016-01-12 – 2016-01-13 (×2): 3 [IU] via SUBCUTANEOUS
  Administered 2016-01-14 – 2016-01-15 (×2): 5 [IU] via SUBCUTANEOUS
  Administered 2016-01-17: 3 [IU] via SUBCUTANEOUS

## 2016-01-08 MED ORDER — GABAPENTIN 300 MG PO CAPS
600.0000 mg | ORAL_CAPSULE | Freq: Every day | ORAL | Status: DC
  Administered 2016-01-08 – 2016-01-17 (×11): 600 mg via ORAL
  Filled 2016-01-08 (×11): qty 2

## 2016-01-08 MED ORDER — GABAPENTIN 300 MG PO CAPS
300.0000 mg | ORAL_CAPSULE | ORAL | Status: DC
  Administered 2016-01-08 – 2016-01-18 (×21): 300 mg via ORAL
  Filled 2016-01-08 (×21): qty 1

## 2016-01-08 MED ORDER — ENOXAPARIN SODIUM 40 MG/0.4ML ~~LOC~~ SOLN
40.0000 mg | SUBCUTANEOUS | Status: DC
  Administered 2016-01-08 – 2016-01-10 (×3): 40 mg via SUBCUTANEOUS
  Filled 2016-01-08 (×7): qty 0.4

## 2016-01-08 MED ORDER — BISACODYL 10 MG RE SUPP: 10.0000 mg | Freq: Every day | RECTAL | Status: DC | PRN

## 2016-01-08 MED ORDER — FERROUS SULFATE 325 (65 FE) MG PO TABS
325.0000 mg | ORAL_TABLET | Freq: Every day | ORAL | Status: DC
  Administered 2016-01-08 – 2016-01-18 (×11): 325 mg via ORAL
  Filled 2016-01-08 (×11): qty 1

## 2016-01-08 MED ORDER — SENNA 8.6 MG PO TABS
1.0000 | ORAL_TABLET | Freq: Two times a day (BID) | ORAL | Status: DC
  Administered 2016-01-08 – 2016-01-18 (×21): 8.6 mg via ORAL
  Filled 2016-01-08 (×23): qty 1

## 2016-01-08 MED ORDER — PANTOPRAZOLE SODIUM 40 MG PO TBEC
40.0000 mg | DELAYED_RELEASE_TABLET | Freq: Every day | ORAL | Status: DC
  Administered 2016-01-08 – 2016-01-18 (×11): 40 mg via ORAL
  Filled 2016-01-08 (×11): qty 1

## 2016-01-08 MED ORDER — METHADONE HCL 10 MG PO TABS
20.0000 mg | ORAL_TABLET | Freq: Four times a day (QID) | ORAL | Status: DC
  Administered 2016-01-08 – 2016-01-11 (×14): 20 mg via ORAL
  Filled 2016-01-08 (×14): qty 2

## 2016-01-08 MED ORDER — LETROZOLE 2.5 MG PO TABS
2.5000 mg | ORAL_TABLET | Freq: Every day | ORAL | Status: DC
  Administered 2016-01-08 – 2016-01-18 (×11): 2.5 mg via ORAL
  Filled 2016-01-08 (×17): qty 1

## 2016-01-08 MED ORDER — DIPHENHYDRAMINE HCL 50 MG/ML IJ SOLN: 12.5000 mg | Freq: Four times a day (QID) | INTRAMUSCULAR | Status: DC | PRN

## 2016-01-08 MED ORDER — ONDANSETRON HCL 4 MG PO TABS: 4.0000 mg | ORAL_TABLET | Freq: Four times a day (QID) | ORAL | Status: DC | PRN

## 2016-01-08 MED ORDER — FLEET ENEMA 7-19 GM/118ML RE ENEM
1.0000 | ENEMA | Freq: Once | RECTAL | Status: DC | PRN
Start: 2016-01-08 — End: 2016-01-18

## 2016-01-08 MED ORDER — DEXAMETHASONE SODIUM PHOSPHATE 10 MG/ML IJ SOLN
40.0000 mg | INTRAMUSCULAR | Status: AC
  Administered 2016-01-08: 40 mg via INTRAVENOUS
  Filled 2016-01-08: qty 4

## 2016-01-08 NOTE — Progress Notes (Signed)
IP PROGRESS NOTE  Subjective:   45 year old woman with metastatic breast cancer to the bone without any evidence of visceral metastasis. She is currently receiving therapy under the care of Dr. Jana Hakim with letrozole, palbociclib, zolendronate. She has chronic pain issues related to her diffuse bony metastasis and had been chronically on methadone.  She presented to the emergency department on 01/07/2016 with complaints of increased pain. Her pain is rather generalized and not localized a specific site. She reports it as graded at 10/10 and a slightly different than her usual pain. She reports in her back, hips, knees as well as ankles.  She was started on Dilaudid PCA and given dose of dexamethasone which have helped her pain this morning. She reports she is more comfortable but still using Dilaudid PCA for pain control. She has reported some abdominal discomfort associated with it but no nausea or vomiting. She denied any neurological deficits and able to ambulate to the bathroom without difficulties.  Objective:  Vital signs in last 24 hours: Temp:  [98.1 F (36.7 C)-99.3 F (37.4 C)] 99.3 F (37.4 C) (08/05 0657) Pulse Rate:  [77-97] 88 (08/05 0657) Resp:  [15-28] 20 (08/05 0657) BP: (104-166)/(70-91) 115/81 (08/05 0657) SpO2:  [88 %-100 %] 93 % (08/05 0657) Weight:  [172 lb 8 oz (78.2 kg)-173 lb (78.5 kg)] 172 lb 8 oz (78.2 kg) (08/05 0015) Weight change:  Last BM Date: 01/07/16  Intake/Output from previous day: 08/04 0701 - 08/05 0700 In: -  Out: 300 [Urine:300]  Mouth: mucous membranes moist, pharynx normal without lesions. No oral thrush noted. Resp: clear to auscultation bilaterally no dullness to percussion. Cardio: regular rate and rhythm, S1, S2 normal, no murmur, click, rub or gallop GI: soft, non-tender; bowel sounds normal; no masses,  no organomegaly Extremities: extremities normal, atraumatic, no cyanosis or edema Neurological examination: No deficits  noted.   Lab Results:  Recent Labs  01/07/16 1509 01/08/16 0356  WBC 3.3* 3.3*  HGB 8.7* 7.7*  HCT 26.8* 23.9*  PLT 109* 102*    BMET  Recent Labs  01/07/16 1509 01/08/16 0356  NA 135 139  K 3.4* 4.0  CL 103 107  CO2 24 24  GLUCOSE 185* 150*  BUN 15 13  CREATININE 1.05* 1.03*  CALCIUM 8.8* 9.1    Studies/Results: Dg Chest 2 View  Result Date: 01/07/2016 CLINICAL DATA:  45 year old current history of metastatic breast cancer for which the patient is currently undergoing chemotherapy. Generalized body aches, most severe involving the back, ribs and chest. EXAM: CHEST  2 VIEW COMPARISON:  CT chest 08/03/2015 and earlier. Chest x-rays 07/21/2015 and earlier. FINDINGS: AP semi-erect and lateral images were obtained. Cardiac silhouette normal in size, unchanged. Right hilar lymphadenopathy, somewhat less prominent than on the February, 2017 CT. Lungs clear. Bronchovascular markings normal. Pulmonary vascularity normal. No visible pleural effusions. No pneumothorax. Previously identified osseous metastases involving the ribs and lower right scapula is less conspicuous on the x-ray. Previously identified metastasis involving the distal right clavicle is partially visualized with an associated pathologic fracture. IMPRESSION: 1.  No acute cardiopulmonary disease. 2. Pathologic fracture involving the distal right clavicle. Other widespread osseous metastatic disease previously identified on bone scan and CT is less conspicuous on the x-ray appear Electronically Signed   By: Evangeline Dakin M.D.   On: 01/07/2016 16:02    Medications: I have reviewed the patient's current medications.  Assessment/Plan:  45 year old woman with the following issues:  1. Breast cancer long-standing history dating back to  2015. She was found to have BRCA negative hormone positive breast cancer. She is status post neoadjuvant chemotherapy with Adriamycin and cyclophosphamide followed by paclitaxel. She  received a left mastectomy and her tumor was found to be ER PR positive HER-2 negative. She received adjuvant radiation therapy and tamoxifen developed recurrent disease in March 2017.  She is currently on letrozole, palbociclib, zolendronate. Her last bone scan was in June 2017 but given her increase pain it would be reasonable to consider obtaining the bone scan sooner to assess her response to therapy. I'll leave this decision to Dr. Jana Hakim who will return on Monday.   2. Diffuse pain: Negative the current pain management approach utilizing PCA. I agree with palliative medicine consult to assist with pain management and the help will be greatly appreciated.  3. Anemia: Her hemoglobin slightly lower than baseline which is close to 9 previously with mild thrombocytopenia as well. This could be related to her treatment currently and previously. I recommend continue monitoring at this time and withhold any blood product transfusion.   4. Thrombocytopenia: Her platelet count around 109 and was within normal range in May 2017. Could also be related due to Palbociclib. No bleeding noted at this time. Continuous monitoring is recommended.      LOS: 0 days   JKQASU,ORVIF 01/08/2016, 9:05 AM

## 2016-01-08 NOTE — Progress Notes (Signed)
Patient ID: Shelly Hall, female   DOB: 05/07/1971, 44 y.o.   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 She is currently receiving therapy under the care of Dr. Magrinat with letrozole, palbociclib, zolendronate. She has chronic pain issues related to her diffuse bony metastasis and had been chronically on methadone.  She presented to the emergency department on 01/07/2016 with complaints of increased pain and was started on Dilaudid PCA and given dose of dexamethasone.  Assessment & Plan:  Breast cancer long-standing history dating back to 2015 - BRCA negative hormone positive breast cancer - status post neoadjuvant chemotherapy with Adriamycin and cyclophosphamide, paclitaxel.  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  - received adjuvant radiation therapy and tamoxifen developed recurrent disease in March 201 - ? Need to obtain the bone scan sooner to assess response to therapy  Diffuse pain  - rather somnolent this AM and barely able to open her eyes   Pancytopenia  - possibly related to treatment with Palbociclib.  - no bleeding at this time - CBC in AM  Hypokalemia  - supplemented and WNL this AM - BMP in AM  Pre renal azotemia - mild, monitor   ? Vaginal boil - pt to somnolent to participate with exam - will attempt exam in AM  DVT prophylaxis: Lovenox SQ Code Status: Full  Family Communication: Patient at bedside  Disposition Plan: Home in few days   Consultants:   Oncology   Procedures:   None   Antimicrobials:   None   Subjective: Very somnolent. Says she is in too much pain.   Objective: Vitals:   01/08/16 1304 01/08/16 1510 01/08/16 1827 01/08/16 2000  BP: (!) 149/79  (!) 149/81   Pulse:  85  77   Resp: 16 (!) 21 18 15  Temp: 98.9 F (37.2 C)  97.9 F (36.6 C)   TempSrc: Oral  Oral   SpO2: 98% 96% 99% 97%  Weight:      Height:        Intake/Output Summary (Last 24 hours) at 01/08/16 2039 Last data filed at 01/08/16 1832  Gross per 24 hour  Intake              240 ml  Output             1150 ml  Net             -910 ml   Filed Weights   01/07/16 1841 01/08/16 0015  Weight: 78.5 kg (173 lb) 78.2 kg (172 lb 8 oz)    Examination:  General exam: Appears tired, somnolent, barely able to follow any commands.  Respiratory system: Respiratory effort normal. Diminished breath sounds at bases  Cardiovascular system: S1 & S2 heard, RRR. No JVD, murmurs, rubs, gallops or clicks.  Gastrointestinal system: Abdomen is nondistended, soft and nontender.   Data Reviewed: I have personally reviewed following labs and imaging studies  CBC:  Recent Labs Lab 01/04/16 1532 01/07/16 1509 01/08/16 0356  WBC 2.9* 3.3* 3.3*  NEUTROABS 2.1 2.5  --   HGB 8.4* 8.7* 7.7*  HCT 26.3* 26.8* 23.9*  MCV 100.0 98.5 98.8  PLT 118* 109* 102*   Basic Metabolic Panel:  Recent Labs Lab   01/04/16 1533 01/07/16 1509 01/08/16 0356  NA 139 135 139  K 4.1 3.4* 4.0  CL  --  103 107  CO2 _0 GLUCOSE 117 185* 150*  BUN 13._1 CREATININE 1.1 1.05* 1.03*  CALCIUM 9.7 8.8* 9.1  MG  --   --  1.9  PHOS  --   --  4.2    Recent Labs Lab 01/04/16 1533 01/07/16 1509 01/08/16 0356  AST 57* 63* 51*  ALT 33 32 30  ALKPHOS 187* 196* 173*  BILITOT <0.30 0.4 0.4  PROT 7.5 8.0 6.9  ALBUMIN 3.5 3.9 3.6   CBG:  Recent Labs Lab 01/08/16 0800 01/08/16 1740  GLUCAP 103* 218*   Thyroid Function Tests:  Recent Labs  01/08/16 0356  TSH 0.942   Urine analysis:    Component Value Date/Time   COLORURINE YELLOW 01/07/2016 1450   APPEARANCEUR CLEAR 01/07/2016 1450   LABSPEC 1.014 01/07/2016 1450   PHURINE 6.5 01/07/2016 1450   GLUCOSEU NEGATIVE 01/07/2016 1450   HGBUR  NEGATIVE 01/07/2016 1450   BILIRUBINUR NEGATIVE 01/07/2016 1450   KETONESUR NEGATIVE 01/07/2016 1450   PROTEINUR NEGATIVE 01/07/2016 1450   UROBILINOGEN 0.2 01/04/2012 0246   NITRITE NEGATIVE 01/07/2016 1450   LEUKOCYTESUR NEGATIVE 01/07/2016 1450    Recent Results (from the past 240 hour(s))  Urine culture     Status: Abnormal   Collection Time: 01/07/16  2:50 PM  Result Value Ref Range Status   Specimen Description URINE, CLEAN CATCH  Final   Special Requests NONE  Final   Culture (A)  Final    <10,000 COLONIES/mL INSIGNIFICANT GROWTH Performed at [facility]    Report Status 01/08/2016 FINAL  Final  Culture, blood (Routine X 2)     Status: None (Preliminary result)   Collection Time: 01/07/16  3:09 PM  Result Value Ref Range Status   Specimen Description BLOOD RIGHT ANTECUBITAL  Final   Special Requests BOTTLES DRAWN AEROBIC AND ANAEROBIC 7ML  Final   Culture   Final    NO GROWTH < 24 HOURS Performed at [facility]    Report Status PENDING  Incomplete  Culture, blood (Routine X 2)     Status: None (Preliminary result)   Collection Time: 01/07/16  3:29 PM  Result Value Ref Range Status   Specimen Description BLOOD RIGHT HAND  Final   Special Requests BOTTLES DRAWN AEROBIC AND ANAEROBIC 5ML  Final   Culture   Final    NO GROWTH < 24 HOURS Performed at [facility]    Report Status PENDING  Incomplete      Radiology Studies: Dg Chest 2 View  Result Date: 01/07/2016 CLINICAL DATA:  45 year old current history of metastatic breast cancer for which the patient is currently undergoing chemotherapy. Generalized body aches, most severe involving the back, ribs and chest. EXAM: CHEST  2 VIEW COMPARISON:  CT chest 08/03/2015 and earlier. Chest x-rays 07/21/2015 and earlier. FINDINGS: AP semi-erect and lateral images were obtained. Cardiac silhouette normal in size, unchanged. Right hilar lymphadenopathy, somewhat less prominent than on the February, 2017  CT. Lungs clear. Bronchovascular markings normal. Pulmonary vascularity normal. No visible pleural effusions. No pneumothorax. Previously identified osseous metastases involving the ribs and lower right scapula is less conspicuous on the x-ray. Previously identified metastasis involving the distal right clavicle is partially visualized with an associated pathologic fracture. IMPRESSION: 1.  No acute cardiopulmonary disease. 2. Pathologic fracture involving the distal right clavicle. Other widespread  osseous metastatic disease previously identified on bone scan and CT is less conspicuous on the x-ray appear Electronically Signed   By: Evangeline Dakin M.D.   On: 01/07/2016 16:02      Scheduled Meds: . dexamethasone  20 mg Intravenous Q24H  . enoxaparin (LOVENOX) injection  40 mg Subcutaneous Q24H  . ferrous sulfate  325 mg Oral Q breakfast  . FLUoxetine  20 mg Oral Daily  . gabapentin  300 mg Oral 2 times per day  . gabapentin  600 mg Oral QHS  . HYDROmorphone   Intravenous Q4H  . insulin aspart  0-15 Units Subcutaneous TID WC  . insulin aspart  0-5 Units Subcutaneous QHS  . letrozole  2.5 mg Oral Daily  . methadone  20 mg Oral QID  . pantoprazole  40 mg Oral Daily  . polyethylene glycol  17 g Oral Daily  . senna  1 tablet Oral BID  . thiamine  100 mg Oral Daily   Continuous Infusions:    LOS: 0 days    Time spent: 20 minutes    Faye Ramsay, MD [facility] Pager [phone]  If 7PM-7AM, please contact night-coverage [email or web address] Password [facility] 01/08/2016, 8:39 PM

## 2016-01-09 DIAGNOSIS — D61810 Antineoplastic chemotherapy induced pancytopenia: Secondary | ICD-10-CM

## 2016-01-09 LAB — BASIC METABOLIC PANEL
ANION GAP: 10 (ref 5–15)
BUN: 18 mg/dL (ref 6–20)
CHLORIDE: 108 mmol/L (ref 101–111)
CO2: 22 mmol/L (ref 22–32)
CREATININE: 1.04 mg/dL — AB (ref 0.44–1.00)
Calcium: 9.2 mg/dL (ref 8.9–10.3)
GFR calc non Af Amer: >60 mL/min (ref >60)
Glucose, Bld: 189 mg/dL — ABNORMAL HIGH (ref 65–99)
POTASSIUM: 4.4 mmol/L (ref 3.5–5.1)
SODIUM: 140 mmol/L (ref 135–145)

## 2016-01-09 LAB — CBC
HEMATOCRIT: 24.9 % — AB (ref 36.0–46.0)
HEMOGLOBIN: 8.1 g/dL — AB (ref 12.0–15.0)
MCH: 31.8 pg (ref 26.0–34.0)
MCHC: 32.5 g/dL (ref 30.0–36.0)
MCV: 97.6 fL (ref 78.0–100.0)
Platelets: 119 10*3/uL — ABNORMAL LOW (ref 150–400)
RBC: 2.55 MIL/uL — AB (ref 3.87–5.11)
RDW: 18.6 % — ABNORMAL HIGH (ref 11.5–15.5)
WBC: 4.9 10*3/uL (ref 4.0–10.5)

## 2016-01-09 LAB — GLUCOSE, CAPILLARY
GLUCOSE-CAPILLARY: 125 mg/dL — AB (ref 65–99)
Glucose-Capillary: 248 mg/dL — ABNORMAL HIGH (ref 65–99)
Glucose-Capillary: 318 mg/dL — ABNORMAL HIGH (ref 65–99)
Glucose-Capillary: 246 mg/dL — ABNORMAL HIGH (ref 65–99)

## 2016-01-09 NOTE — Progress Notes (Signed)
Patient ID: Shelly Hall, female   DOB: 09-28-1970, 45 y.o.   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 She is currently receiving therapy under the care of Dr. Jana Hakim with letrozole, palbociclib, zolendronate. She has chronic pain issues related to her diffuse bony metastasis and had been chronically on methadone.  She presented to the emergency department on 01/07/2016 with complaints of increased pain and was started on Dilaudid PCA and given dose of dexamethasone.  Assessment & Plan:  Breast cancer long-standing history dating back to 2015 - BRCA negative hormone positive breast cancer - status post neoadjuvant chemotherapy with Adriamycin and cyclophosphamide, paclitaxel.  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  - received adjuvant radiation therapy and tamoxifen developed recurrent disease in March 201 - ? Need to obtain the bone scan sooner to assess response to therapy  Diffuse pain  - better this AM, less pain and reports pain medications are working well for pain control   Left labial area (vaginal) boil - pt examined with chaperone RN at the bedside - small amount of pus drained but not purulent - advised to place warm compresses and monitor  - if better in next 24 hours, would not need further interventions but if gets worse, may need draining  Pancytopenia  - possibly related to treatment with Palbociclib.  - no bleeding at this time, blood counts look better this AM - CBC in AM  Hypokalemia  - supplemented and WNL this AM - BMP in AM  Pre renal azotemia - mild, monitor   DVT prophylaxis: Lovenox SQ Code Status: Full  Family Communication: Patient at bedside  Disposition Plan: Home in few days    Consultants:   Oncology   Procedures:   None   Antimicrobials:   None   Subjective: Very somnolent. Says she is in too much pain.   Objective: Vitals:   01/09/16 0303 01/09/16 0400 01/09/16 0436 01/09/16 0759  BP: (!) 150/77  (!) 156/82   Pulse: 72  63   Resp: 14 17 16 16   Temp: 98.6 F (37 C)  97.7 F (36.5 C)   TempSrc: Oral  Oral   SpO2: 100% 93% 97% 93%  Weight:      Height:        Intake/Output Summary (Last 24 hours) at 01/09/16 1112 Last data filed at 01/09/16 0459  Gross per 24 hour  Intake           1386.4 ml  Output             1300 ml  Net             86.4 ml   Filed Weights   01/07/16 1841 01/08/16 0015  Weight: 78.5 kg (173 lb) 78.2 kg (172 lb 8 oz)    Examination:  General exam: Appears tired, somnolent, barely able to follow any commands.  Respiratory system: Respiratory effort normal. Diminished breath sounds at bases  Cardiovascular system: S1 & S2 heard, RRR. No JVD, murmurs, rubs, gallops or clicks.  Gastrointestinal system: Abdomen is nondistended, soft and nontender.  GU: small boil on the left labial area, opened up and with clear drainage but some surrounding blood, TTP, no surrounding erythema   Data  Reviewed: I have personally reviewed following labs and imaging studies  CBC:  Recent Labs Lab 01/04/16 1532 01/07/16 1509 01/08/16 0356 01/09/16 0418  WBC 2.9* 3.3* 3.3* 4.9  NEUTROABS 2.1 2.5  --   --   HGB 8.4* 8.7* 7.7* 8.1*  HCT 26.3* 26.8* 23.9* 24.9*  MCV 100.0 98.5 98.8 97.6  PLT 118* 109* 102* 579*   Basic Metabolic Panel:  Recent Labs Lab 01/04/16 1533 01/07/16 1509 01/08/16 0356 01/09/16 0418  NA 139 135 139 140  K 4.1 3.4* 4.0 4.4  CL  --  103 107 108  CO2 27 24 24 22   GLUCOSE 117 185* 150* 189*  BUN 13.2 15 13 18   CREATININE 1.1 1.05* 1.03* 1.04*  CALCIUM 9.7 8.8* 9.1 9.2  MG  --   --  1.9  --   PHOS  --   --  4.2  --     Recent Labs Lab 01/04/16 1533 01/07/16 1509 01/08/16 0356  AST 57*  63* 51*  ALT 33 32 30  ALKPHOS 187* 196* 173*  BILITOT <0.30 0.4 0.4  PROT 7.5 8.0 6.9  ALBUMIN 3.5 3.9 3.6   CBG:  Recent Labs Lab 01/08/16 0800 01/08/16 1740 01/08/16 2127 01/09/16 0743  GLUCAP 103* 218* 247* 125*   Thyroid Function Tests:  Recent Labs  01/08/16 0356  TSH 0.942   Urine analysis:    Component Value Date/Time   COLORURINE YELLOW 01/07/2016 1450   APPEARANCEUR CLEAR 01/07/2016 1450   LABSPEC 1.014 01/07/2016 1450   PHURINE 6.5 01/07/2016 1450   GLUCOSEU NEGATIVE 01/07/2016 1450   HGBUR NEGATIVE 01/07/2016 1450   BILIRUBINUR NEGATIVE 01/07/2016 1450   KETONESUR NEGATIVE 01/07/2016 1450   PROTEINUR NEGATIVE 01/07/2016 1450   UROBILINOGEN 0.2 01/04/2012 0246   NITRITE NEGATIVE 01/07/2016 1450   LEUKOCYTESUR NEGATIVE 01/07/2016 1450    Recent Results (from the past 240 hour(s))  Urine culture     Status: Abnormal   Collection Time: 01/07/16  2:50 PM  Result Value Ref Range Status   Specimen Description URINE, CLEAN CATCH  Final   Special Requests NONE  Final   Culture (A)  Final    <10,000 COLONIES/mL INSIGNIFICANT GROWTH Performed at [facility]    Report Status 01/08/2016 FINAL  Final  Culture, blood (Routine X 2)     Status: None (Preliminary result)   Collection Time: 01/07/16  3:09 PM  Result Value Ref Range Status   Specimen Description BLOOD RIGHT ANTECUBITAL  Final   Special Requests BOTTLES DRAWN AEROBIC AND ANAEROBIC 7ML  Final   Culture   Final    NO GROWTH < 24 HOURS Performed at [facility]    Report Status PENDING  Incomplete  Culture, blood (Routine X 2)     Status: None (Preliminary result)   Collection Time: 01/07/16  3:29 PM  Result Value Ref Range Status   Specimen Description BLOOD RIGHT HAND  Final   Special Requests BOTTLES DRAWN AEROBIC AND ANAEROBIC 5ML  Final   Culture   Final    NO GROWTH < 24 HOURS Performed at [facility]    Report Status PENDING  Incomplete      Radiology  Studies: Dg Chest 2 View  Result Date: 01/07/2016 CLINICAL DATA:  45 year old current history of metastatic breast cancer for which the patient is currently undergoing chemotherapy. Generalized body aches, most severe involving the back, ribs and chest. EXAM: CHEST  2 VIEW COMPARISON:  CT chest 08/03/2015 and earlier.  Chest x-rays 07/21/2015 and earlier. FINDINGS: AP semi-erect and lateral images were obtained. Cardiac silhouette normal in size, unchanged. Right hilar lymphadenopathy, somewhat less prominent than on the February, 2017 CT. Lungs clear. Bronchovascular markings normal. Pulmonary vascularity normal. No visible pleural effusions. No pneumothorax. Previously identified osseous metastases involving the ribs and lower right scapula is less conspicuous on the x-ray. Previously identified metastasis involving the distal right clavicle is partially visualized with an associated pathologic fracture. IMPRESSION: 1.  No acute cardiopulmonary disease. 2. Pathologic fracture involving the distal right clavicle. Other widespread osseous metastatic disease previously identified on bone scan and CT is less conspicuous on the x-ray appear Electronically Signed   By: Evangeline Dakin M.D.   On: 01/07/2016 16:02      Scheduled Meds: . dexamethasone  20 mg Intravenous Q24H  . enoxaparin (LOVENOX) injection  40 mg Subcutaneous Q24H  . ferrous sulfate  325 mg Oral Q breakfast  . FLUoxetine  20 mg Oral Daily  . gabapentin  300 mg Oral 2 times per day  . gabapentin  600 mg Oral QHS  . HYDROmorphone   Intravenous Q4H  . insulin aspart  0-15 Units Subcutaneous TID WC  . insulin aspart  0-5 Units Subcutaneous QHS  . letrozole  2.5 mg Oral Daily  . methadone  20 mg Oral QID  . pantoprazole  40 mg Oral Daily  . polyethylene glycol  17 g Oral Daily  . senna  1 tablet Oral BID  . thiamine  100 mg Oral Daily   Continuous Infusions:    LOS: 1 day    Time spent: 20 minutes    Faye Ramsay,  MD [facility] Pager [phone]  If 7PM-7AM, please contact night-coverage [email or web address] Password [email or web address] 01/09/2016, 11:12 AM

## 2016-01-09 NOTE — Progress Notes (Signed)
IP PROGRESS NOTE  Subjective:   No major events noted overnight. Her pain continues to be an issue despite using Dilaudid PCA. She is ambulating to the bathroom without any falls or syncope. She denied any fevers or chills or sweats. She denied any alteration of mental status.  Objective:  Vital signs in last 24 hours: Temp:  [97.5 F (36.4 C)-98.9 F (37.2 C)] 97.7 F (36.5 C) (08/06 0436) Pulse Rate:  [63-92] 63 (08/06 0436) Resp:  [13-21] 16 (08/06 0759) BP: (125-156)/(77-94) 156/82 (08/06 0436) SpO2:  [93 %-100 %] 93 % (08/06 0759) Weight change:  Last BM Date: 01/06/16 (thursday per patient)  Intake/Output from previous day: 08/05 0701 - 08/06 0700 In: 1386.4 [P.O.:840; I.V.:14.4] Out: 1500 [Urine:1500] Alert, awake woman appeared in some distress. Mouth: mucous membranes moist, pharynx normal without lesions Resp: clear to auscultation bilaterally Cardio: regular rate and rhythm, S1, S2 normal, no murmur, click, rub or gallop GI: soft, non-tender; bowel sounds normal; no masses,  no organomegaly Extremities: extremities normal, atraumatic, no cyanosis or edema  Portacath/PICC-without erythema  Lab Results:  Recent Labs  01/08/16 0356 01/09/16 0418  WBC 3.3* 4.9  HGB 7.7* 8.1*  HCT 23.9* 24.9*  PLT 102* 119*    BMET  Recent Labs  01/08/16 0356 01/09/16 0418  NA 139 140  K 4.0 4.4  CL 107 108  CO2 24 22  GLUCOSE 150* 189*  BUN 13 18  CREATININE 1.03* 1.04*  CALCIUM 9.1 9.2    Studies/Results: Dg Chest 2 View  Result Date: 01/07/2016 CLINICAL DATA:  45 year old current history of metastatic breast cancer for which the patient is currently undergoing chemotherapy. Generalized body aches, most severe involving the back, ribs and chest. EXAM: CHEST  2 VIEW COMPARISON:  CT chest 08/03/2015 and earlier. Chest x-rays 07/21/2015 and earlier. FINDINGS: AP semi-erect and lateral images were obtained. Cardiac silhouette normal in size, unchanged. Right hilar  lymphadenopathy, somewhat less prominent than on the February, 2017 CT. Lungs clear. Bronchovascular markings normal. Pulmonary vascularity normal. No visible pleural effusions. No pneumothorax. Previously identified osseous metastases involving the ribs and lower right scapula is less conspicuous on the x-ray. Previously identified metastasis involving the distal right clavicle is partially visualized with an associated pathologic fracture. IMPRESSION: 1.  No acute cardiopulmonary disease. 2. Pathologic fracture involving the distal right clavicle. Other widespread osseous metastatic disease previously identified on bone scan and CT is less conspicuous on the x-ray appear Electronically Signed   By: Evangeline Dakin M.D.   On: 01/07/2016 16:02    Medications: I have reviewed the patient's current medications.  Assessment/Plan:  45 year old woman with the following issues:  1. Breast cancer long-standing history dating back to 2015. She was found to have BRCA negative hormone positive breast cancer. She is status post neoadjuvant chemotherapy with Adriamycin and cyclophosphamide followed by paclitaxel. 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 She received adjuvant radiation therapy and tamoxifen developed recurrent disease in March 2017.  She is currently on letrozole, palbociclib, zolendronate. Given her increased pain, I recommended repeating the bone scan to rule out progression of disease. This will be ordered today so we'll be available for Dr. Jana Hakim for review on 01/10/2016 in case change of therapy is indicated.  2. Bone pain: Her pain appears to be poorly controlled despite the Dilaudid PCA. I recommended continue supportive management as you're doing and consideration for palliative medicine consult to assist in pain management.  3. Pancytopenia: Likely related to Palbociclib. Her counts are stable without any need for transfusion or  supportive measures.     LOS: 1 day   [facility] 01/09/2016, 8:37 AM

## 2016-01-10 ENCOUNTER — Encounter (HOSPITAL_COMMUNITY): Payer: Medicare Other

## 2016-01-10 ENCOUNTER — Inpatient Hospital Stay (HOSPITAL_COMMUNITY): Payer: Medicare Other

## 2016-01-10 LAB — GLUCOSE, CAPILLARY
GLUCOSE-CAPILLARY: 68 mg/dL (ref 65–99)
GLUCOSE-CAPILLARY: 157 mg/dL — AB (ref 65–99)
GLUCOSE-CAPILLARY: 144 mg/dL — AB (ref 65–99)
Glucose-Capillary: 244 mg/dL — ABNORMAL HIGH (ref 65–99)
Glucose-Capillary: 278 mg/dL — ABNORMAL HIGH (ref 65–99)

## 2016-01-10 LAB — BASIC METABOLIC PANEL
Anion gap: 8 (ref 5–15)
BUN: 16 mg/dL (ref 6–20)
CHLORIDE: 109 mmol/L (ref 101–111)
CO2: 21 mmol/L — AB (ref 22–32)
Calcium: 9 mg/dL (ref 8.9–10.3)
Creatinine, Ser: 0.84 mg/dL (ref 0.44–1.00)
GFR calc Af Amer: >60 mL/min (ref >60)
GFR calc non Af Amer: >60 mL/min (ref >60)
Glucose, Bld: 208 mg/dL — ABNORMAL HIGH (ref 65–99)
POTASSIUM: 4.4 mmol/L (ref 3.5–5.1)
SODIUM: 138 mmol/L (ref 135–145)

## 2016-01-10 LAB — CBC
HEMATOCRIT: 26.1 % — AB (ref 36.0–46.0)
HEMOGLOBIN: 8.3 g/dL — AB (ref 12.0–15.0)
MCH: 31.3 pg (ref 26.0–34.0)
MCHC: 31.8 g/dL (ref 30.0–36.0)
MCV: 98.5 fL (ref 78.0–100.0)
Platelets: 126 10*3/uL — ABNORMAL LOW (ref 150–400)
RBC: 2.65 MIL/uL — AB (ref 3.87–5.11)
RDW: 18.3 % — ABNORMAL HIGH (ref 11.5–15.5)
WBC: 5.9 10*3/uL (ref 4.0–10.5)

## 2016-01-10 MED ORDER — AMPHETAMINE-DEXTROAMPHETAMINE 10 MG PO TABS
10.0000 mg | ORAL_TABLET | Freq: Every day | ORAL | Status: DC
  Administered 2016-01-10 – 2016-01-11 (×2): 10 mg via ORAL
  Filled 2016-01-10 (×2): qty 1

## 2016-01-10 MED ORDER — TECHNETIUM TC 99M MEDRONATE IV KIT
25.0000 | PACK | Freq: Once | INTRAVENOUS | Status: AC | PRN
  Administered 2016-01-10: 23.4 via INTRAVENOUS

## 2016-01-10 MED ORDER — NICOTINE 21 MG/24HR TD PT24
21.0000 mg | MEDICATED_PATCH | Freq: Every day | TRANSDERMAL | Status: DC
  Administered 2016-01-10 – 2016-01-18 (×9): 21 mg via TRANSDERMAL
  Filled 2016-01-10 (×9): qty 1

## 2016-01-10 NOTE — Progress Notes (Signed)
Dr. Doyle Askew notified on the results of bone scan.

## 2016-01-10 NOTE — Progress Notes (Signed)
[place]  [phone] [phone]    ID: Shelly Hall OB: 10/26/1970  MR#: [id number]  MLJ#:[id number]  PCP: Elyn Peers, MD GYN:  [name] SU: [name] OTHER MD: Thea Silversmith  CHIEF COMPLAINT:  Left Breast Cancer, estrogen receptor positive  CURRENT TREATMENT: letrozole, palbociclib, zolendronate  BREAST CANCER HISTORY: From the original intake note:  Shelly Hall palpated a mass in her left breast mid-April 2015 and immediately brought it to Dr. Benjie Karvonen' attention. She was set up for bilateral screening mammography with tomography at [facility] hospital 09/19/2013. The possible distortion in the left breast and a nearby group of calcifications was felt to warrant further evaluation, and on 09/30/2013 she underwent unilateral left diagnostic mammography and ultrasonography. There was an irregular mass in the outer left breast associated with microcalcifications. 4 cm anterior to this there was another cluster of pleomorphic calcifications spanning 7 mm. He had more anteriorly there was an irregular mass associated with other calcifications. In total the abnormalities in the left breast spent approximately 10 cm, and is segmental distribution. On exam there was a palpable firm mass at the 2:30 o'clock position of the left breast 10 cm from the nipple. There was palpable adenopathy in the inferior left axilla. Ultrasound confirmed an irregular hypoechoic mass in the left breast measuring 4.6 cm. In addition, a 1.4 cm oval slightly irregular mass was noted at the 3:00 position and yet another mass measured 9 mm in the same area. Ultrasound of the left axilla showed a dominant 3.6 cm lymph node.  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 (Because all 3 biopsies were morphologically identical, only one prognostic panel was sent).  On 10/10/2013 the patient underwent bilateral breast MRI. This showed numerous enhancing masses in the left breast, the largest measuring 3.5 cm, and the aggregate measuring 12.7 cm. There were numerous enlarged left axillary lymph nodes, at least 5 of which were enlarged, both at levels 1 and 2. The largest measured 2.3 cm. The right breast was negative.  The patient's subsequent history is as detailed below.  INTERVAL HISTORY: Shelly Hall returns today for follow-up of her estrogen receptor positive metastatic breast cancer. She started the current cycle of cyclic on 88/32/5498. She has been tolerating it well. She does think it decreases her appetite and takes away her sense of taste. She is also on letrozole daily. This causes or sweats Shelly Hall although she does not experience these as "flashes". She tolerates zolendronate well.  REVIEW OF SYSTEMS: Shelly Hall describes herself is moderately fatigued. She continues to have pain in the pelvis lower back and the right upper arm. Her vision is poor and she has an appointment already scheduled for September. Her ears feel "full" and a clinic sometimes when she swallows. She is occasionally nauseated. She has urinary stress incontinence which is not new. She feels anxious and depressed. She denies unusual headaches or dizziness. A detailed review of systems today was otherwise stable \  PAST MEDICAL HISTORY: Past Medical History:  Diagnosis Date  . Anemia   . Anxiety    no meds currently  . Breast cancer ([place]) 2015   left upper outer;  had chemo  . Bronchitis 2014  . Complication of anesthesia  makes pt. restless and unable to be still  . Depression    no meds currently  . GERD (gastroesophageal reflux disease)   . Heart murmur    never had any problems as adult  . High cholesterol   .  Hypertension   . Radiation 06/23/14-08/11/14   Invasive ductal ca o left breast  . Sarcoidosis ([place])    no problems per patient   . SVD (spontaneous vaginal delivery)    x 2  . Thyroid cancer ([place]) 2015  . Type II diabetes mellitus ([place])     PAST SURGICAL HISTORY: Past Surgical History:  Procedure Laterality Date  . ABDOMINAL HYSTERECTOMY    . BREAST BIOPSY Left 2015 X 3   biopsy  . ENDOBRONCHIAL ULTRASOUND Bilateral 12/02/2012   Procedure: ENDOBRONCHIAL ULTRASOUND;  Surgeon: Collene Gobble, MD;  Location: [facility];  Service: Cardiopulmonary;  Laterality: Bilateral;  . LAPAROSCOPIC ASSISTED VAGINAL HYSTERECTOMY N/A 12/09/2014   Procedure: LAPAROSCOPIC ASSISTED VAGINAL HYSTERECTOMY;  Surgeon: Cheri Fowler, MD;  Location: [facility];  Service: Gynecology;  Laterality: N/A;  . LEEP    . LUNG BIOPSY  2014  . MASTECTOMY W/ SENTINEL NODE BIOPSY Left 04/23/2014   & axillary LND  . MASTECTOMY W/ SENTINEL NODE BIOPSY Left 04/23/2014   Procedure: LEFT BREAST MASTECTOMY WITH SENTINEL LYMPH NODE BIOPSY AND AXILLARY LYMPH NODE DISECTION;  Surgeon: [name], MD;  Location: [place];  Service: General;  Laterality: Left;  . PORT-A-CATH REMOVAL Left 02/03/2015   Procedure: REMOVAL PORT-A-CATH;  Surgeon: [name], MD;  Location: [place];  Service: General;  Laterality: Left;  . PORTACATH PLACEMENT N/A 10/22/2013   Procedure: INSERTION PORT-A-CATH;  Surgeon: [name], MD;  Location: [place];  Service: General;  Laterality: N/A;  . SALPINGOOPHORECTOMY Bilateral 12/09/2014   Procedure: SALPINGO OOPHORECTOMY;  Surgeon: Cheri Fowler, MD;  Location: [facility];  Service: Gynecology;  Laterality: Bilateral;  . THYROID LOBECTOMY Left 04/23/2014   Procedure: LEFT THYROID LOBECTOMY;  Surgeon: [name], MD;  Location: [place];  Service: General;  Laterality: Left;  . TUBAL LIGATION  2001  . WISDOM TOOTH EXTRACTION      FAMILY HISTORY Family History  Problem Relation Age of Onset  .  Cancer Paternal Aunt     "bone cancer"; deceased 14s  . Cancer Paternal Uncle     stomach cancer; deceased 67s  . Cancer Paternal Aunt     unk. primary; currently 13s  . Asthma Mother   . Prostate cancer Maternal Grandfather 1  The patient's parents are living, in fair mid to late 62s. The patient had one brother, no sisters. There is no history of breast or ovarian cancer in the family to her knowledge.   GYNECOLOGIC HISTORY:   (Reviewed 11/27/2013)  menarche age 89, first live birth at 89. She is GX P2. She was still having regular periods at the time of the start of her chemotherapy in May 2015.  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 She is single and lives alone, although her mother is currently staying with her. Her son Shelly Hall lives in [place] and works at [employer]. Son Shelly Hall lives in [place] where he works at a [employer]. The patient has 1 grandchild born in Sept 2015, by her son, Shelly Cowing. She is a [place]: not in place; discussed again on 10/08/2015   HEALTH MAINTENANCE: (Updated 11/27/2013) Social History  Substance Use Topics  .  Smoking status: Current Some Day Smoker    Packs/day: 0.25    Years: 25.00    Types: Cigarettes  . Smokeless tobacco: Never Used     Comment: 04/23/2014 "using vapor; down to 3-4 cigarettes/day now"  . Alcohol use 6.0 oz/week    4 Cans of beer, 4 Shots of liquor, 2 Standard drinks or equivalent per week     Colonoscopy: Never  PAP: Not on file  Bone density: Not on file  Lipid panel:  Not on file  Allergies  Allergen Reactions  . Nyquil Multi-Symptom [Pseudoeph-Doxylamine-Dm-Apap] Itching and Other (See Comments)    Reaction:  Skin peeling on hands/feet     Current Facility-Administered Medications  Medication Dose Route Frequency Provider Last Rate Last Dose  . bisacodyl (DULCOLAX) suppository 10 mg  10 mg Rectal Daily PRN  Toy Baker, MD      . dexamethasone (DECADRON) injection 20 mg  20 mg Intravenous Q24H Toy Baker, MD   20 mg at 01/09/16 1039  . diphenhydrAMINE (BENADRYL) injection 12.5 mg  12.5 mg Intravenous Q6H PRN Toy Baker, MD       Or  . diphenhydrAMINE (BENADRYL) 12.5 MG/5ML elixir 12.5 mg  12.5 mg Oral Q6H PRN Toy Baker, MD      . enoxaparin (LOVENOX) injection 40 mg  40 mg Subcutaneous Q24H Toy Baker, MD   40 mg at 01/09/16 1039  . ferrous sulfate tablet 325 mg  325 mg Oral Q breakfast Toy Baker, MD   325 mg at 01/09/16 1040  . FLUoxetine (PROZAC) capsule 20 mg  20 mg Oral Daily Toy Baker, MD   20 mg at 01/09/16 1041  . gabapentin (NEURONTIN) capsule 300 mg  300 mg Oral 2 times per day Toy Baker, MD   300 mg at 01/09/16 1543  . gabapentin (NEURONTIN) capsule 600 mg  600 mg Oral QHS Toy Baker, MD   600 mg at 01/09/16 2235  . HYDROmorphone (DILAUDID) 1 mg/mL PCA injection   Intravenous Q4H Anastassia Doutova, MD      . insulin aspart (novoLOG) injection 0-15 Units  0-15 Units Subcutaneous TID WC Toy Baker, MD   11 Units at 01/09/16 1825  . insulin aspart (novoLOG) injection 0-5 Units  0-5 Units Subcutaneous QHS Toy Baker, MD   2 Units at 01/09/16 2236  . letrozole [facility]) tablet 2.5 mg  2.5 mg Oral Daily Toy Baker, MD   2.5 mg at 01/09/16 1046  . methadone (DOLOPHINE) tablet 20 mg  20 mg Oral QID Toy Baker, MD   20 mg at 01/09/16 2235  . naloxone [facility]) injection 0.4 mg  0.4 mg Intravenous PRN Toy Baker, MD       And  . sodium chloride flush (NS) 0.9 % injection 9 mL  9 mL Intravenous PRN Toy Baker, MD      . ondansetron (ZOFRAN) tablet 4 mg  4 mg Oral Q6H PRN Toy Baker, MD       Or  . ondansetron (ZOFRAN) injection 4 mg  4 mg Intravenous Q6H PRN Toy Baker, MD      . ondansetron (ZOFRAN) injection 4 mg  4 mg Intravenous Q6H PRN Toy Baker, MD       . pantoprazole (PROTONIX) EC tablet 40 mg  40 mg Oral Daily Toy Baker, MD   40 mg at 01/09/16 1042  . polyethylene glycol (MIRALAX / GLYCOLAX) packet 17 g  17 g Oral Daily Toy Baker, MD   17 g at 01/09/16 1038  .  prochlorperazine (COMPAZINE) tablet 10 mg  10 mg Oral Q6H PRN Toy Baker, MD      . senna (SENOKOT) tablet 8.6 mg  1 tablet Oral BID Toy Baker, MD   8.6 mg at 01/09/16 2240  . sodium phosphate (FLEET) 7-19 GM/118ML enema 1 enema  1 enema Rectal Once PRN Toy Baker, MD      . thiamine (VITAMIN B-1) tablet 100 mg  100 mg Oral Daily Toy Baker, MD   100 mg at 01/09/16 1042    OBJECTIVE: Middle-aged [place] American woman who appears stated age 29:   01/10/16 0400 01/10/16 0632  BP:    Pulse:    Resp: 14 17  Temp:       Body mass index is 30.56 kg/m.    ECOG FS:2 - Symptomatic, <50% confined to bed   Sclerae unicteric, EOMs intact Oropharynx clear and moist No cervical or supraclavicular adenopathy Lungs no rales or rhonchi Heart regular rate and rhythm Abd soft, obese, nontender, positive bowel sounds MSK no focal spinal tenderness to mild palpation Neuro: nonfocal, well oriented, appropriate affect Breasts: Deferred     LAB RESULTS:   Lab Results  Component Value Date   WBC 5.9 01/10/2016   NEUTROABS 2.5 01/07/2016   HGB 8.3 (L) 01/10/2016   HCT 26.1 (L) 01/10/2016   MCV 98.5 01/10/2016   PLT 126 (L) 01/10/2016      Chemistry      Component Value Date/Time   NA 138 01/10/2016 0340   NA 139 01/04/2016 1533   K 4.4 01/10/2016 0340   K 4.1 01/04/2016 1533   CL 109 01/10/2016 0340   CO2 21 (L) 01/10/2016 0340   CO2 27 01/04/2016 1533   BUN 16 01/10/2016 0340   BUN 13.2 01/04/2016 1533   CREATININE 0.84 01/10/2016 0340   CREATININE 1.1 01/04/2016 1533      Component Value Date/Time   CALCIUM 9.0 01/10/2016 0340   CALCIUM 9.7 01/04/2016 1533   ALKPHOS 173 (H) 01/08/2016 0356   ALKPHOS 187 (H)  01/04/2016 1533   AST 51 (H) 01/08/2016 0356   AST 57 (H) 01/04/2016 1533   ALT 30 01/08/2016 0356   ALT 33 01/04/2016 1533   BILITOT 0.4 01/08/2016 0356   BILITOT <0.30 01/04/2016 1533      STUDIES: Dg Chest 2 View  Result Date: 01/07/2016 CLINICAL DATA:  45 year old current history of metastatic breast cancer for which the patient is currently undergoing chemotherapy. Generalized body aches, most severe involving the back, ribs and chest. EXAM: CHEST  2 VIEW COMPARISON:  CT chest 08/03/2015 and earlier. Chest x-rays 07/21/2015 and earlier. FINDINGS: AP semi-erect and lateral images were obtained. Cardiac silhouette normal in size, unchanged. Right hilar lymphadenopathy, somewhat less prominent than on the February, 2017 CT. Lungs clear. Bronchovascular markings normal. Pulmonary vascularity normal. No visible pleural effusions. No pneumothorax. Previously identified osseous metastases involving the ribs and lower right scapula is less conspicuous on the x-ray. Previously identified metastasis involving the distal right clavicle is partially visualized with an associated pathologic fracture. IMPRESSION: 1.  No acute cardiopulmonary disease. 2. Pathologic fracture involving the distal right clavicle. Other widespread osseous metastatic disease previously identified on bone scan and CT is less conspicuous on the x-ray appear Electronically Signed   By: Evangeline Dakin M.D.   On: 01/07/2016 16:02    ASSESSMENT: 45 y.o. BRCA negative [place] woman with a history of sarcoidosis and breast cancer as follows:  (1)  status post left breast and  left axillary lymph node biopsy 10/03/2013, both positive for a clinical T2 N2, stage IIIA invasive ductal carcinoma, grade 2, estrogen and progesterone receptor positive, HER-2 negative, with an MIB-1 of 20%.  (2) Treated with neoadjuvant chemotherapy consisting of doxorubicin and cyclophosphamide in dose dense fashion x4, completed 12/11/2013, followed by  paclitaxel weekly x12 (dose reduced by 20% at cycle 5 because of neuropathy concerns) completed 03/19/2014.  (3) status post left mastectomy and left axillary lymph node dissection 04/23/2014 for a pT1c pN2, stage IIIA invasive ductal carcinoma, grade 3, estrogen receptor 99% positive, progesterone receptor 4% positive, with no HER-2 amplification  (3) adjuvant radiation completed 08/11/2014 Left chest wall / 50.4 Gray @ 1.8 Gray per fraction x 28 fractions Left Supraclavicular fossa / 45 Gray _0 .8 Gray per fraction x 25 fractions Left PAB / 45 Gy at 1.8 Gray per fraction x 25 fractions Left scar / 10 Gray at [employer] per fraction x 5 fractions  (4) tamoxifen started 10/10/14, discontinued March 2017 with evidence of metastatic recurrence  (5) current smoker. Attending quitting cessation therapy with the moviation that she must quit before she has a breast reduction.   (6) genetic testing sent 10/16/2013 did not reveal any deleterious mutation in any of these genes. The genes tested were ATM, BARD1, BRCA1, BRCA2, BRIP1, CDH1, CHEK2, MRE11A, MUTYH, NBN, NF1, PALB2, PTEN, RAD50, RAD51C, RAD51D, and TP53.  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, no evidence of malignancy   (8) s/p hysterectomy and bilateral salpingo-oophorectomy 12/09/2014, with benign pathology  METASTATIC DISEASE: documented February 2017, with bone involvement, no lung or liver lesions (9) biopsy 08/13/2015 confirms metastatic breast cancer, estrogen receptor positive, progesterone receptor and HER-2 negative  (a) CA 27-29 is informative  (10) letrozole and palbociclib started 08/13/2015  (a) palbociclib dropped to 100 mg/day on 09/01/15  (11) palliative radiation 08/23/2015-09/06/2015: Pelvis, right shoulder and left proximal femur were all treated to 30 GY in 10 fractions at 3 Gy per fraction  (12) started zolendronate 09/01/2015   PLAN: Makenze's pain is finally better controlled. She is  able to get around more, take care of her grandchild, and wants to volunteer. Nevertheless she remains very limited in what she can do, of course. She does have a tendency to fall asleep which is of concern.  She tolerates the eye letrozole and palbociclib remarkably well and we have not had significant cytopenias.  At this point we are going to check lab work and months from now, which is when her next cycle would be starting, and then August 21. She will see me in with the August visit and she will receive zolendronate that same day.  We will repeat a bone scan sometime in November  I am adding Compazine to her medications since occasionally she experiences a little bit of nausea.  Shelly Hall knows to call for any problems that may develop before her next visit here.  Shelly Cruel, MD 01/10/2016 7:21 AM

## 2016-01-10 NOTE — Progress Notes (Signed)
Patient ID: Shelly Hall, female   DOB: December 03, 1970, 45 y.o.   MRN: [id number]    PROGRESS NOTE    Shelly Hall  IRJ:[id number] DOB: [birth year] DOA: 01/07/2016  PCP: Elyn Peers, MD   Brief Narrative:  45 year old woman with metastatic breast cancer to the bone without any evidence of visceral metastasis. She is currently receiving therapy under the care of Dr. Jana Hakim with letrozole, palbociclib, zolendronate. She has chronic pain issues related to her diffuse bony metastasis and had been chronically on methadone.  She presented to the emergency department on 01/07/2016 with complaints of increased pain and was started on Dilaudid PCA and given dose of dexamethasone.  Assessment & Plan:  Breast cancer long-standing history dating back to 2015 - BRCA negative hormone positive breast cancer - status post neoadjuvant chemotherapy with Adriamycin and cyclophosphamide, paclitaxel.  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  - received adjuvant radiation therapy and tamoxifen developed recurrent disease in March 201 - plan for bone scan in November   Diffuse pain  - better this AM, still on Dilaudid drip and says she feels like she needs breakthrough pain relief as well   Left labial area (vaginal) boil - pt examined with chaperone RN at the bedside 8/6 - small amount of pus drained but not purulent - pt says it feels better and like it drained, will defer today, and examine in AM   Pancytopenia  - possibly related to treatment with Palbociclib.  - no bleeding at this time, blood counts look better this AM - CBC in AM  Hypokalemia  - supplemented and WNL this AM - BMP in AM  Pre renal azotemia - resolved   DVT prophylaxis: Lovenox SQ Code Status: Full  Family Communication: Patient at bedside  Disposition Plan: Home in few days   Consultants:   Oncology   Procedures:   None   Antimicrobials:    None   Subjective: Very somnolent. Says she is in too much pain.   Objective: Vitals:   01/10/16 0632 01/10/16 0658 01/10/16 0943 01/10/16 1154  BP:  (!) 150/91 (!) 152/90   Pulse:  79 80   Resp: 17 16 16 15   Temp:  98 F (36.7 C) 97.6 F (36.4 C)   TempSrc:  Oral Oral   SpO2: 98% 100% 100% 98%  Weight:      Height:        Intake/Output Summary (Last 24 hours) at 01/10/16 1558 Last data filed at 01/10/16 1504  Gross per 24 hour  Intake           1074.9 ml  Output             1025 ml  Net             49.9 ml   Filed Weights   01/07/16 1841 01/08/16 0015  Weight: 78.5 kg (173 lb) 78.2 kg (172 lb 8 oz)    Examination:  General exam: Appears tired, somnolent, barely able to follow any commands.  Respiratory system: Respiratory effort normal. Diminished breath sounds at bases  Cardiovascular system: S1 & S2 heard, RRR. No JVD, murmurs, rubs, gallops or clicks.  Gastrointestinal system: Abdomen is nondistended, soft and nontender.  GU: small boil on the left labial area, opened up and with clear drainage but some surrounding blood, TTP, no surrounding erythema   Data Reviewed: I have personally reviewed following labs and imaging studies  CBC:  Recent Labs Lab 01/04/16 1532 01/07/16 1509 01/08/16 0356 01/09/16 0418 01/10/16 0340  WBC 2.9* 3.3* 3.3* 4.9 5.9  NEUTROABS 2.1 2.5  --   --   --   HGB 8.4* 8.7* 7.7* 8.1* 8.3*  HCT 26.3* 26.8* 23.9* 24.9* 26.1*  MCV 100.0 98.5 98.8 97.6 98.5  PLT 118* 109* 102* 119* 333*   Basic Metabolic Panel:  Recent Labs Lab 01/04/16 1533 01/07/16 1509 01/08/16 0356 01/09/16 0418 01/10/16 0340  NA 139 135 139 140 138  K 4.1 3.4* 4.0 4.4 4.4  CL  --  103 107 108 109  CO2 27 24 24 22  21*  GLUCOSE 117 185* 150* 189* 208*  BUN 13.2 15 13 18 16   CREATININE 1.1 1.05* 1.03* 1.04* 0.84  CALCIUM 9.7 8.8* 9.1 9.2 9.0  MG  --   --  1.9  --   --   PHOS  --   --  4.2  --   --     Recent Labs Lab 01/04/16 1533  01/07/16 1509 01/08/16 0356  AST 57* 63* 51*  ALT 33 32 30  ALKPHOS 187* 196* 173*  BILITOT <0.30 0.4 0.4  PROT 7.5 8.0 6.9  ALBUMIN 3.5 3.9 3.6   CBG:  Recent Labs Lab 01/09/16 1728 01/09/16 2213 01/10/16 0743 01/10/16 0927 01/10/16 1207  GLUCAP 318* 246* 68 157* 244*   Thyroid Function Tests:  Recent Labs  01/08/16 0356  TSH 0.942   Urine analysis:    Component Value Date/Time   COLORURINE YELLOW 01/07/2016 1450   APPEARANCEUR CLEAR 01/07/2016 1450   LABSPEC 1.014 01/07/2016 1450   PHURINE 6.5 01/07/2016 1450   GLUCOSEU NEGATIVE 01/07/2016 1450   HGBUR NEGATIVE 01/07/2016 1450   BILIRUBINUR NEGATIVE 01/07/2016 1450   KETONESUR NEGATIVE 01/07/2016 1450   PROTEINUR NEGATIVE 01/07/2016 1450   UROBILINOGEN 0.2 01/04/2012 0246   NITRITE NEGATIVE 01/07/2016 1450   LEUKOCYTESUR NEGATIVE 01/07/2016 1450    Recent Results (from the past 240 hour(s))  Urine culture     Status: Abnormal   Collection Time: 01/07/16  2:50 PM  Result Value Ref Range Status   Specimen Description URINE, CLEAN CATCH  Final   Special Requests NONE  Final   Culture (A)  Final    <10,000 COLONIES/mL INSIGNIFICANT GROWTH Performed at [facility]    Report Status 01/08/2016 FINAL  Final  Culture, blood (Routine X 2)     Status: None (Preliminary result)   Collection Time: 01/07/16  3:09 PM  Result Value Ref Range Status   Specimen Description BLOOD RIGHT ANTECUBITAL  Final   Special Requests BOTTLES DRAWN AEROBIC AND ANAEROBIC 7ML  Final   Culture   Final    NO GROWTH 3 DAYS Performed at [facility]    Report Status PENDING  Incomplete  Culture, blood (Routine X 2)     Status: None (Preliminary result)   Collection Time: 01/07/16  3:29 PM  Result Value Ref Range Status   Specimen Description BLOOD RIGHT HAND  Final   Special Requests BOTTLES DRAWN AEROBIC AND ANAEROBIC 5ML  Final   Culture   Final    NO GROWTH 3 DAYS Performed at [facility]    Report  Status PENDING  Incomplete      Radiology Studies: No results found.    Scheduled Meds: . amphetamine-dextroamphetamine  10 mg Oral Q breakfast  . dexamethasone  20 mg Intravenous Q24H  . enoxaparin (LOVENOX) injection  40 mg Subcutaneous Q24H  .  ferrous sulfate  325 mg Oral Q breakfast  . FLUoxetine  20 mg Oral Daily  . gabapentin  300 mg Oral 2 times per day  . gabapentin  600 mg Oral QHS  . HYDROmorphone   Intravenous Q4H  . insulin aspart  0-15 Units Subcutaneous TID WC  . insulin aspart  0-5 Units Subcutaneous QHS  . letrozole  2.5 mg Oral Daily  . methadone  20 mg Oral QID  . pantoprazole  40 mg Oral Daily  . polyethylene glycol  17 g Oral Daily  . senna  1 tablet Oral BID  . thiamine  100 mg Oral Daily   Continuous Infusions:    LOS: 2 days    Time spent: 20 minutes    Faye Ramsay, MD [facility] Pager [phone]  If 7PM-7AM, please contact night-coverage [email or web address] Password [facility] 01/10/2016, 3:58 PM

## 2016-01-10 NOTE — Progress Notes (Signed)
Hypoglycemic Event  CBG: 68  Treatment: 15 GM carbohydrate snack  Symptoms: None  Follow-up CBG: Time:157 CBG Result:0927  Possible Reasons for Event: Inadequate meal intake  Comments/MD notified:Dr. Annabelle Harman, Sherrine Maples

## 2016-01-11 DIAGNOSIS — M2550 Pain in unspecified joint: Secondary | ICD-10-CM

## 2016-01-11 LAB — BASIC METABOLIC PANEL
ANION GAP: 8 (ref 5–15)
BUN: 17 mg/dL (ref 6–20)
CHLORIDE: 109 mmol/L (ref 101–111)
CO2: 25 mmol/L (ref 22–32)
Calcium: 9 mg/dL (ref 8.9–10.3)
Creatinine, Ser: 0.96 mg/dL (ref 0.44–1.00)
GLUCOSE: 105 mg/dL — AB (ref 65–99)
POTASSIUM: 4.1 mmol/L (ref 3.5–5.1)
Sodium: 142 mmol/L (ref 135–145)

## 2016-01-11 LAB — GLUCOSE, CAPILLARY
GLUCOSE-CAPILLARY: 71 mg/dL (ref 65–99)
GLUCOSE-CAPILLARY: 172 mg/dL — AB (ref 65–99)
Glucose-Capillary: 250 mg/dL — ABNORMAL HIGH (ref 65–99)
Glucose-Capillary: 430 mg/dL — ABNORMAL HIGH (ref 65–99)
Glucose-Capillary: 447 mg/dL — ABNORMAL HIGH (ref 65–99)

## 2016-01-11 MED ORDER — HYDROMORPHONE HCL 1 MG/ML IJ SOLN
1.0000 mg | INTRAMUSCULAR | Status: DC | PRN
  Administered 2016-01-11 – 2016-01-12 (×4): 1 mg via INTRAVENOUS
  Filled 2016-01-11 (×4): qty 1

## 2016-01-11 MED ORDER — METHADONE HCL 10 MG PO TABS
25.0000 mg | ORAL_TABLET | Freq: Four times a day (QID) | ORAL | Status: DC
  Administered 2016-01-11 – 2016-01-17 (×23): 25 mg via ORAL
  Filled 2016-01-11 (×23): qty 1

## 2016-01-11 MED ORDER — LIP MEDEX EX OINT
TOPICAL_OINTMENT | CUTANEOUS | Status: DC | PRN
  Administered 2016-01-11: via TOPICAL

## 2016-01-11 MED ORDER — AMPHETAMINE-DEXTROAMPHETAMINE 10 MG PO TABS
10.0000 mg | ORAL_TABLET | Freq: Two times a day (BID) | ORAL | Status: DC
  Administered 2016-01-12 – 2016-01-18 (×14): 10 mg via ORAL
  Filled 2016-01-11 (×14): qty 1

## 2016-01-11 MED ORDER — LIP MEDEX EX OINT
TOPICAL_OINTMENT | CUTANEOUS | Status: AC
  Administered 2016-01-11
  Filled 2016-01-11: qty 7

## 2016-01-11 MED ORDER — INSULIN ASPART 100 UNIT/ML ~~LOC~~ SOLN
10.0000 [IU] | Freq: Once | SUBCUTANEOUS | Status: AC
Start: 2016-01-11 — End: 2016-01-11
  Administered 2016-01-11: 10 [IU] via SUBCUTANEOUS

## 2016-01-11 NOTE — Progress Notes (Signed)
Shelly Hall looks much better today- she is more interactive and much less lethargic. She is still on PCA with variable use and also continues to have pain in her left hip that goes down her leg.   Plan today is to transition her off the dilaudid PCA, titrate up her methadone and increase the frequency of the adderall and work on getting her home.  Before I left the room- she stopped me and asked me how long she had to live- she tells me she wants Dr. Jana Hakim to tell her - I discussed prognostic uncertainty and also that with breast cancer especially there are so many options for controlling the cancer progression even in metastatic disease.  She was in the middle of a bath when I saw her today, an I am planning on talking about advanced care planning and code status before she leaves the hospital.  1. D/C PCA Total Average 24 hour is about Hydromorphone IV 3mg , this is roughly equivalent to 18mg  of methadone-which converts to an additional 5 mg of methadone per dosing interval. Increased methadone to 25mg  QID. Will leave IV RN administer hydroM for severe pain and during transition of PCA.  2. Doing very well with the Adderall-increase to BID dosing-much less lethargy  3. Will follow-up with prognosis request.  4. She may need continued decadron for SM, will titrate down to lowest possible effective dose.   Lane Hacker, DO Palliative Medicine  Time: 3:45-410 Total: 25 min Greater than 50%  of this time was spent counseling and coordinating care related to the above assessment and plan.

## 2016-01-11 NOTE — Evaluation (Signed)
Physical Therapy Evaluation Patient Details Name: Shelly Hall MRN: [id number] DOB: 01-23-1971 Today's Date: 01/11/2016   History of Present Illness  45 yo female admitted with DM, intractable pain. Hx of DM, met breast ca with bony mets, sarcoidosis, HTN, chronic anemia  Clinical Impression  On eval, pt was Min guard assist for mobility. She walked ~200 feet while holding on to IV pole. Pt tolerated activity fairly well. Will plan to follow and progress activity as tolerated.     Follow Up Recommendations No PT follow up;Supervision - Intermittent    Equipment Recommendations  None recommended by PT    Recommendations for Other Services       Precautions / Restrictions Precautions Precautions: Fall Restrictions Weight Bearing Restrictions: No      Mobility  Bed Mobility Overal bed mobility: Modified Independent                Transfers Overall transfer level: Modified independent                  Ambulation/Gait Ambulation/Gait assistance: Min guard Ambulation Distance (Feet): 200 Feet Assistive device:  (IV pole for support) Gait Pattern/deviations: Step-through pattern     General Gait Details: close guard for safety. slow gait speed.   Stairs            Wheelchair Mobility    Modified Rankin (Stroke Patients Only)       Balance                                             Pertinent Vitals/Pain Pain Assessment: 0-10 Pain Score: 6  Pain Location: neck, back Pain Intervention(s): PCA encouraged    Home Living Family/patient expects to be discharged to:: Private residence Living Arrangements: Parent (may d/c to friend's or family's home if needed) Available Help at Discharge: Family;Friend(s) Type of Home: Apartment Home Access: Stairs to enter   [employer] of Steps: apt on 3rd floor Home Layout: One level Home Equipment: Cane - single point;Walker - 2 wheels;Wheelchair - [employer];Shower seat      Prior Function Level of Independence: Independent with assistive device(s)         Comments: cane     Hand Dominance        Extremity/Trunk Assessment   Upper Extremity Assessment: Generalized weakness           Lower Extremity Assessment: Generalized weakness      Cervical / Trunk Assessment: Normal  Communication   Communication: No difficulties  Cognition Arousal/Alertness: Awake/alert Behavior During Therapy: WFL for tasks assessed/performed Overall Cognitive Status: Within Functional Limits for tasks assessed                      General Comments      Exercises        Assessment/Plan    PT Assessment Patient needs continued PT services  PT Diagnosis Difficulty walking;Acute pain   PT Problem List Decreased mobility;Pain;Decreased activity tolerance  PT Treatment Interventions Gait training;Functional mobility training;Stair training;Therapeutic activities;Therapeutic exercise;DME instruction;Patient/family education   PT Goals (Current goals can be found in the Care Plan section) Acute Rehab PT Goals Patient Stated Goal: less pain. PT Goal Formulation: With patient Time For Goal Achievement: 01/25/16 Potential to Achieve Goals: Good    Frequency Min 3X/week   Barriers to discharge  Co-evaluation               End of Session   Activity Tolerance: Patient tolerated treatment well Patient left: in bed;with call bell/phone within reach           Time: 4825-0037 PT Time Calculation (min) (ACUTE ONLY): 17 min   Charges:   PT Evaluation $PT Eval Low Complexity: 1 Procedure     PT G Codes:        Weston Anna, MPT Pager: [phone]

## 2016-01-11 NOTE — Care Management Note (Signed)
Case Management Note  Patient Details  Name: Shelly Hall MRN: [id number] Date of Birth: [birth year]  Subjective/Objective:      45 yo admitted with Diabetes and metastatic breast cancer to the bone              Action/Plan: From home. Pt plans to go to friend/family home at discharge. She states she lives in a 3rd floor apartment and will have a hard time getting up and down the stairs right now. Pt asked about housing per income and I instructed her to check in with DSS for resources.  Pt wanting to exercise when discharged. She was encouraged by MD to swim due to her bone mets. This CM gave pt financial assistance application for the [employer] to apply for membership. Pt appreciative of CM assistance.  Expected Discharge Date:                  Expected Discharge Plan:  Home/Self Care  In-House Referral:     Discharge planning Services  CM Consult [facility] information)  Post Acute Care Choice:    Choice offered to:     DME Arranged:    DME Agency:     HH Arranged:    HH Agency:     Status of Service:  In process, will continue to follow  If discussed at Long Length of Stay Meetings, dates discussed:    Additional CommentsLynnell Catalan, RN 01/11/2016, 2:03 PM  [phone]

## 2016-01-11 NOTE — Progress Notes (Signed)
Patient ID: Shelly Hall, female   DOB: Nov 08, 1970, 45 y.o.   MRN: [id number]    PROGRESS NOTE    Shelly Hall  OIZ:[id number] DOB: 1970/11/05 DOA: 01/07/2016  PCP: Shelly Peers, MD   Brief Narrative:  45 year old woman with metastatic breast cancer to the bone without any evidence of visceral metastasis. She is currently receiving therapy under the care of Dr. Jana Hall with letrozole, palbociclib, zolendronate. She has chronic pain issues related to her diffuse bony metastasis and had been chronically on methadone.  She presented to the emergency department on 01/07/2016 with complaints of increased pain and was started on Dilaudid PCA and given dose of dexamethasone.  Assessment & Plan:  Breast cancer long-standing history dating back to 2015 - BRCA negative hormone positive breast cancer - status post neoadjuvant chemotherapy with Adriamycin and cyclophosphamide, paclitaxel.  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  - received adjuvant radiation therapy and tamoxifen developed recurrent disease in March 201 - bone scan with multiple mets in appendicular area and axillary area, unchanged from prior study   Diffuse pain  - better this AM, still on Dilaudid drip and says she feels like she needs breakthrough pain relief as well  - pt is likely to go home once pain better controlled and if able to change to oral regimen  - advised to consider aquatic exercises, will ask CM to run insurance to see if this will be covered   Left labial area (vaginal) boil - pt examined with chaperone RN at the bedside 8/6 - small amount of pus drained but not purulent - healing   Pancytopenia  - possibly related to treatment with Palbociclib.  - no bleeding at this time, blood counts look better this AM - CBC in AM  Hypokalemia  - supplemented and WNL this AM  Pre renal azotemia - resolved   DVT prophylaxis: Lovenox SQ Code Status: Full    Family Communication: Patient at bedside  Disposition Plan: Home in few days, once off iv pain meds and changed to oral meds   Consultants:   Oncology   PCT for pain management   Procedures:   None   Antimicrobials:   None  Subjective: Very somnolent. Says she is in too much pain.   Objective: Vitals:   01/10/16 2130 01/10/16 2359 01/11/16 0220 01/11/16 0400  BP: (!) 187/92  (!) 163/88   Pulse: (!) 56  64   Resp: 14 19 18 13   Temp: 98.3 F (36.8 C)  98.8 F (37.1 C)   TempSrc: Oral  Oral   SpO2: 97% 99% 96% 98%  Weight:      Height:        Intake/Output Summary (Last 24 hours) at 01/11/16 0636 Last data filed at 01/10/16 1953  Gross per 24 hour  Intake            604.2 ml  Output              750 ml  Net           -145.8 ml   Filed Weights   01/07/16 1841 01/08/16 0015  Weight: 78.5 kg (173 lb) 78.2 kg (172 lb 8 oz)    Examination:  General exam: Appears tired, somnolent, barely able to follow any commands.  Respiratory system: Respiratory effort normal. Diminished breath sounds at bases  Cardiovascular system: S1 & S2 heard, RRR. No JVD, murmurs, rubs, gallops or clicks.  Gastrointestinal system: Abdomen is nondistended, soft and nontender.  GU: small boil on the left labial area, opened up and with clear drainage but some surrounding blood, TTP, no surrounding erythema   Data Reviewed: I have personally reviewed following labs and imaging studies  CBC:  Recent Labs Lab 01/04/16 1532 01/07/16 1509 01/08/16 0356 01/09/16 0418 01/10/16 0340  WBC 2.9* 3.3* 3.3* 4.9 5.9  NEUTROABS 2.1 2.5  --   --   --   HGB 8.4* 8.7* 7.7* 8.1* 8.3*  HCT 26.3* 26.8* 23.9* 24.9* 26.1*  MCV 100.0 98.5 98.8 97.6 98.5  PLT 118* 109* 102* 119* 299*   Basic Metabolic Panel:  Recent Labs Lab 01/07/16 1509 01/08/16 0356 01/09/16 0418 01/10/16 0340 01/11/16 0515  NA 135 139 140 138 142  K 3.4* 4.0 4.4 4.4 4.1  CL 103 107 108 109 109  CO2 24 24 22  21* 25   GLUCOSE 185* 150* 189* 208* 105*  BUN 15 13 18 16 17   CREATININE 1.05* 1.03* 1.04* 0.84 0.96  CALCIUM 8.8* 9.1 9.2 9.0 9.0  MG  --  1.9  --   --   --   PHOS  --  4.2  --   --   --     Recent Labs Lab 01/04/16 1533 01/07/16 1509 01/08/16 0356  AST 57* 63* 51*  ALT 33 32 30  ALKPHOS 187* 196* 173*  BILITOT <0.30 0.4 0.4  PROT 7.5 8.0 6.9  ALBUMIN 3.5 3.9 3.6   CBG:  Recent Labs Lab 01/10/16 0743 01/10/16 0927 01/10/16 1207 01/10/16 1731 01/10/16 2128  GLUCAP 68 157* 244* 278* 144*   Thyroid Function Tests: No results for input(s): TSH, T4TOTAL, FREET4, T3FREE, THYROIDAB in the last 72 hours. Urine analysis:    Component Value Date/Time   COLORURINE YELLOW 01/07/2016 1450   APPEARANCEUR CLEAR 01/07/2016 1450   LABSPEC 1.014 01/07/2016 1450   PHURINE 6.5 01/07/2016 1450   GLUCOSEU NEGATIVE 01/07/2016 1450   HGBUR NEGATIVE 01/07/2016 1450   BILIRUBINUR NEGATIVE 01/07/2016 1450   KETONESUR NEGATIVE 01/07/2016 1450   PROTEINUR NEGATIVE 01/07/2016 1450   UROBILINOGEN 0.2 01/04/2012 0246   NITRITE NEGATIVE 01/07/2016 1450   LEUKOCYTESUR NEGATIVE 01/07/2016 1450    Recent Results (from the past 240 hour(s))  Urine culture     Status: Abnormal   Collection Time: 01/07/16  2:50 PM  Result Value Ref Range Status   Specimen Description URINE, CLEAN CATCH  Final   Special Requests NONE  Final   Culture (A)  Final    <10,000 COLONIES/mL INSIGNIFICANT GROWTH Performed at [facility]    Report Status 01/08/2016 FINAL  Final  Culture, blood (Routine X 2)     Status: None (Preliminary result)   Collection Time: 01/07/16  3:09 PM  Result Value Ref Range Status   Specimen Description BLOOD RIGHT ANTECUBITAL  Final   Special Requests BOTTLES DRAWN AEROBIC AND ANAEROBIC 7ML  Final   Culture   Final    NO GROWTH 3 DAYS Performed at [facility]    Report Status PENDING  Incomplete  Culture, blood (Routine X 2)     Status: None (Preliminary result)    Collection Time: 01/07/16  3:29 PM  Result Value Ref Range Status   Specimen Description BLOOD RIGHT HAND  Final   Special Requests BOTTLES DRAWN AEROBIC AND ANAEROBIC 5ML  Final   Culture   Final    NO GROWTH 3 DAYS Performed at [facility]  Report Status PENDING  Incomplete      Radiology Studies: Nm Bone Scan Whole Body  Result Date: 01/10/2016 CLINICAL DATA:  History of breast carcinoma. Evaluate bone metastatic disease. EXAM: NUCLEAR MEDICINE WHOLE BODY BONE SCAN TECHNIQUE: Whole body anterior and posterior images were obtained approximately 3 hours after intravenous injection of radiopharmaceutical. RADIOPHARMACEUTICALS:  23.4 mCi Technetium-84mMDP IV COMPARISON:  Bone scan, 11/11/2015 and 07/28/2015. FINDINGS: Uptake is again noted involving skull, right clavicle and scapula, multiple ribs, the sternum, pelvis, both femurs and subtly within both humeri. There are no definite new bone lesions when compared to the most recent prior study. The pattern of uptake appears stable. Renal uptake is minimally evident. IMPRESSION: 1. Multiple areas of metastatic disease to the axillary and appendicular skeleton, without significant change from the most recent prior study. Electronically Signed   By: DLajean ManesM.D.   On: 01/10/2016 16:22      Scheduled Meds: . amphetamine-dextroamphetamine  10 mg Oral Q breakfast  . dexamethasone  20 mg Intravenous Q24H  . enoxaparin (LOVENOX) injection  40 mg Subcutaneous Q24H  . ferrous sulfate  325 mg Oral Q breakfast  . FLUoxetine  20 mg Oral Daily  . gabapentin  300 mg Oral 2 times per day  . gabapentin  600 mg Oral QHS  . HYDROmorphone   Intravenous Q4H  . insulin aspart  0-15 Units Subcutaneous TID WC  . insulin aspart  0-5 Units Subcutaneous QHS  . letrozole  2.5 mg Oral Daily  . methadone  20 mg Oral QID  . nicotine  21 mg Transdermal Daily  . pantoprazole  40 mg Oral Daily  . polyethylene glycol  17 g Oral Daily  . senna  1  tablet Oral BID  . thiamine  100 mg Oral Daily   Continuous Infusions:    LOS: 3 days    Time spent: 20 minutes    MFaye Ramsay MD [facility] Pager [phone] If 7PM-7AM, please contact night-coverage [email or web address] Password [facility]8/01/2016, 6:36 AM

## 2016-01-12 DIAGNOSIS — Z515 Encounter for palliative care: Secondary | ICD-10-CM

## 2016-01-12 DIAGNOSIS — E119 Type 2 diabetes mellitus without complications: Secondary | ICD-10-CM

## 2016-01-12 DIAGNOSIS — R0781 Pleurodynia: Secondary | ICD-10-CM

## 2016-01-12 DIAGNOSIS — M2550 Pain in unspecified joint: Secondary | ICD-10-CM

## 2016-01-12 DIAGNOSIS — R52 Pain, unspecified: Secondary | ICD-10-CM

## 2016-01-12 DIAGNOSIS — R530 Neoplastic (malignant) related fatigue: Secondary | ICD-10-CM

## 2016-01-12 LAB — GLUCOSE, CAPILLARY
GLUCOSE-CAPILLARY: 102 mg/dL — AB (ref 65–99)
GLUCOSE-CAPILLARY: 111 mg/dL — AB (ref 65–99)
GLUCOSE-CAPILLARY: 216 mg/dL — AB (ref 65–99)
Glucose-Capillary: 238 mg/dL — ABNORMAL HIGH (ref 65–99)
Glucose-Capillary: 245 mg/dL — ABNORMAL HIGH (ref 65–99)
Glucose-Capillary: 280 mg/dL — ABNORMAL HIGH (ref 65–99)

## 2016-01-12 LAB — BASIC METABOLIC PANEL
ANION GAP: 10 (ref 5–15)
BUN: 18 mg/dL (ref 6–20)
CHLORIDE: 104 mmol/L (ref 101–111)
CO2: 23 mmol/L (ref 22–32)
Calcium: 8.6 mg/dL — ABNORMAL LOW (ref 8.9–10.3)
Creatinine, Ser: 1.1 mg/dL — ABNORMAL HIGH (ref 0.44–1.00)
GFR calc Af Amer: >60 mL/min (ref >60)
GFR, EST NON AFRICAN AMERICAN: 59 mL/min — AB (ref >60)
GLUCOSE: 266 mg/dL — AB (ref 65–99)
POTASSIUM: 4.1 mmol/L (ref 3.5–5.1)
Sodium: 137 mmol/L (ref 135–145)

## 2016-01-12 LAB — CULTURE, BLOOD (ROUTINE X 2)
CULTURE: NO GROWTH
Culture: NO GROWTH

## 2016-01-12 LAB — CBC
HEMATOCRIT: 25.8 % — AB (ref 36.0–46.0)
HEMOGLOBIN: 8.5 g/dL — AB (ref 12.0–15.0)
MCH: 32.2 pg (ref 26.0–34.0)
MCHC: 32.9 g/dL (ref 30.0–36.0)
MCV: 97.7 fL (ref 78.0–100.0)
PLATELETS: 171 10*3/uL (ref 150–400)
RBC: 2.64 MIL/uL — AB (ref 3.87–5.11)
RDW: 18.5 % — ABNORMAL HIGH (ref 11.5–15.5)
WBC: 6.4 10*3/uL (ref 4.0–10.5)

## 2016-01-12 MED ORDER — SODIUM CHLORIDE 0.9 % IV SOLN: 4.0000 mg | Freq: Once | INTRAVENOUS | Status: DC

## 2016-01-12 MED ORDER — HYDROMORPHONE HCL 1 MG/ML IJ SOLN
0.5000 mg | INTRAMUSCULAR | Status: DC | PRN
  Administered 2016-01-12 – 2016-01-15 (×8): 0.5 mg via INTRAVENOUS
  Filled 2016-01-12 (×8): qty 1

## 2016-01-12 MED ORDER — METHADONE HCL 10 MG PO TABS
10.0000 mg | ORAL_TABLET | Freq: Four times a day (QID) | ORAL | Status: DC | PRN
  Administered 2016-01-12 – 2016-01-17 (×5): 10 mg via ORAL
  Filled 2016-01-12 (×8): qty 1

## 2016-01-12 MED ORDER — SODIUM CHLORIDE 0.9 % IV SOLN
90.0000 mg | Freq: Once | INTRAVENOUS | Status: AC
  Administered 2016-01-12: 90 mg via INTRAVENOUS
  Filled 2016-01-12: qty 10

## 2016-01-12 NOTE — Progress Notes (Signed)
Nursing Note: 10 units of novolog given per one time order.wbb

## 2016-01-12 NOTE — Progress Notes (Signed)
Daily Progress Note   Patient Name: Shelly Hall       Date: 01/12/2016 DOB: April 26, 1971  Age: 45 y.o. MRN#: [id number] Attending Physician: Mariel Aloe, MD Primary Care Physician: Elyn Peers, MD Admit Date: 01/07/2016  Reason for Consultation/Follow-up: Non pain symptom management and Pain control  Subjective:  Patient seen at bedside. States she has had continuing pain today requiring frequent use of prn medication. She was able to walk around the floor last night and "felt great". However, today she has been unable to get herself out of bed or wash herself due to pain. She doesn't feel as tired, and feels she is benefiting from the Adderall, but notes the pain is lending to her fatigue. Denies palpitations, excessive thirst, constipation.  Length of Stay: 4  Current Medications: Scheduled Meds:  . amphetamine-dextroamphetamine  10 mg Oral BID WC  . dexamethasone  20 mg Intravenous Q24H  . enoxaparin (LOVENOX) injection  40 mg Subcutaneous Q24H  . ferrous sulfate  325 mg Oral Q breakfast  . FLUoxetine  20 mg Oral Daily  . gabapentin  300 mg Oral 2 times per day  . gabapentin  600 mg Oral QHS  . insulin aspart  0-15 Units Subcutaneous TID WC  . insulin aspart  0-5 Units Subcutaneous QHS  . letrozole  2.5 mg Oral Daily  . methadone  25 mg Oral QID  . nicotine  21 mg Transdermal Daily  . pantoprazole  40 mg Oral Daily  . polyethylene glycol  17 g Oral Daily  . senna  1 tablet Oral BID  . thiamine  100 mg Oral Daily  . zoledronic acid (ZOMETA) IV  4 mg Intravenous Once    Continuous Infusions:    PRN Meds: bisacodyl, HYDROmorphone (DILAUDID) injection, lip balm, methadone, ondansetron **OR** ondansetron (ZOFRAN) IV, prochlorperazine, sodium phosphate   Review of Systems    Respiratory: Negative.   Cardiovascular: Negative.   Musculoskeletal:       Pain r/t bone mets   Neurological: Positive for weakness.  Psychiatric/Behavioral: The patient does not have insomnia.     Physical Exam  Constitutional: She is oriented to person, place, and time. She appears well-developed and well-nourished. No distress.  Cardiovascular: Normal rate and regular rhythm.   Pulmonary/Chest: Effort normal and breath sounds normal.  Abdominal: Soft.  Bowel sounds are normal.  Musculoskeletal:  Decreased mobility of R arm r/t pain  Neurological: She is alert and oriented to person, place, and time.  Skin: Skin is warm and dry.  Psychiatric: She has a normal mood and affect. Her behavior is normal.            Vital Signs: BP (!) 138/99 (BP Location: Right Arm)   Pulse 88   Temp 98 F (36.7 C) (Oral)   Resp 18   Ht 5\' 3"  (1.6 m)   Wt 78.2 kg (172 lb 8 oz)   LMP 12/07/2014 Comment: spotting  SpO2 100%   BMI 30.56 kg/m  SpO2: SpO2: 100 % O2 Device: O2 Device: Not Delivered O2 Flow Rate:    Intake/output summary:  Intake/Output Summary (Last 24 hours) at 01/12/16 1402 Last data filed at 01/12/16 1141  Gross per 24 hour  Intake              240 ml  Output                0 ml  Net              240 ml   LBM: Last BM Date: 01/09/16 Baseline Weight: Weight: 78.5 kg (173 lb) Most recent weight: Weight: 78.2 kg (172 lb 8 oz)       Palliative Assessment/Data: PPS: 50%   Flowsheet Rows   Flowsheet Row Most Recent Value  Intake Tab  Referral Department  Hospitalist  Unit at Time of Referral  Oncology Unit  Palliative Care Primary Diagnosis  Cardiac  Date Notified  01/08/16  Palliative Care Type  Return patient Palliative Care  Reason for referral  Clarify Goals of Care, Pain  Date of Admission  01/07/16  Date first seen by Palliative Care  01/10/16  # of days Palliative referral response time  2 Day(s)  # of days IP prior to Palliative referral  1  Clinical  Assessment  Psychosocial & Spiritual Assessment  Palliative Care Outcomes      Patient Active Problem List   Diagnosis Date Noted  . Arthralgia   . Uncontrolled pain 01/07/2016  . Intractable pain   . Palliative care encounter   . Diverticula of colon 09/28/2015  . Pain 09/28/2015  . Encounter for palliative care   . Metastatic cancer ([place]) 09/18/2015  . Cancer associated pain 09/18/2015  . Pain from bone metastases ([place]) 08/27/2015  . Pain due to malignant neoplasm metastatic to bone ([place]) 08/06/2015  . Genetic testing 04/09/2015  . Lymphedema of arm 02/18/2015  . S/P hysterectomy 12/09/2014  . Lymphedema 11/17/2014  . Gum symptoms 03/18/2014  . Peripheral neuropathy due to chemotherapy ([place]) 01/22/2014  . Hot flashes 01/15/2014  . 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 Esophageal reflux 11/27/2013  . 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 Recurrent genital herpes 11/06/2013  . Chemotherapy induced neutropenia ([place]) 11/06/2013  . Anemia, unspecified 11/06/2013  . Diabetes mellitus type II, non insulin dependent ([place]) 11/06/2013  . Left thyroid nodule 11/06/2013  . Sarcoidosis ([place]) 10/15/2013  . Breast cancer of upper-outer quadrant of left female breast ([place]) 10/10/2013  . Mediastinal lymphadenopathy 11/21/2012  . Tobacco use disorder 11/21/2012    Palliative Care Assessment & Plan   Patient Profile:   Assessment:  Pain requiring some titration of medications. This is to be expected as methadone can require up to 10-21 days to reach a steady state.  Fatigue improved with Adderall, and  will likely improve even more when pain is controlled.   Recommendations/Plan:  Continue to transition to po pain meds. Reduce hydromorphone to .5mg  Q4 hours prn. Start methadone 10mg  po q6 hr prn. Continue methadone 25mg  QID.   Continue Adderall 10mg  BID  Zoledronic Acid 4mg  today as bisphosphonate adjuvant for pain relief d/t bone mets. Note: Ok to redose Zometa q2  weeks for pain relief if needed.  Goals of Care and Additional Recommendations:  Limitations on Scope of Treatment: Full Scope Treatment at this time.   Code Status:    Code Status Orders        Start     Ordered   01/08/16 0018  Full code  Continuous     01/08/16 0017    Code Status History    Date Active Date Inactive Code Status Order ID Comments User Context   09/28/2015  4:25 PM 10/03/2015  5:52 PM Full Code [id number]  Cristal Ford, DO Inpatient   09/18/2015  6:06 PM 09/21/2015  5:26 PM Full Code [id number]  Kelvin Cellar, MD Inpatient   12/09/2014  2:42 PM 12/10/2014  4:45 PM Full Code [id number]  Cheri Fowler, MD Inpatient   04/23/2014  3:41 PM 04/25/2014  6:38 PM Full Code [id number]  [name], MD Inpatient       Prognosis:   Unable to determine- prognostication in the setting of metastatic breast cancer is complicated and difficult. Will follow up with Dr. Hilma Favors to address.  Discharge Planning:  To Be Determined  Care plan was discussed with patient and Dr. Hilma Favors.  Thank you for allowing the Palliative Medicine Team to assist in the care of this patient.   Time In: 1345 Time Out: 1415 Total Time 30 Prolonged Time Billed No      Greater than 50%  of this time was spent counseling and coordinating care related to the above assessment and plan.  Mariana Kaufman, AGNP-C Palliative Medicine   Please contact Palliative Medicine Team phone at [phone] for questions and concerns.

## 2016-01-12 NOTE — Care Management Important Message (Signed)
Important Message  Patient Details  Name: KASAUNDRA JING MRN: [id number] Date of Birth: 10/18/1970   Medicare Important Message Given:       [name] 01/12/2016, 10:10 AMImportant Message  Patient Details  Name: SHAM CORAL MRN: [id number] Date of Birth: 01/24/1971   Medicare Important Message Given:       [name] 01/12/2016, 10:10 AM

## 2016-01-12 NOTE — Progress Notes (Signed)
Nursing Note: CBG [phone],paged on-call and made aware.

## 2016-01-12 NOTE — Progress Notes (Signed)
Patient ID: Shelly Hall, female   DOB: April 12, 1971, 45 y.o.   MRN: [id number]    PROGRESS NOTE    Shelly Hall  [id number] DOB: 08-Oct-1970 DOA: 01/07/2016  PCP: Elyn Peers, MD   Brief Narrative:  45 year old woman with metastatic breast cancer to the bone without any evidence of visceral metastasis. She is currently receiving therapy under the care of Dr. Jana Hakim with letrozole, palbociclib, zolendronate. She has chronic pain issues related to her diffuse bony metastasis and had been chronically on methadone.  She presented to the emergency department on 01/07/2016 with complaints of increased pain and was started on Dilaudid PCA and given dose of dexamethasone.  Assessment & Plan:  Breast cancer long-standing history dating back to 2015 - BRCA negative hormone positive breast cancer - status post neoadjuvant chemotherapy with Adriamycin and cyclophosphamide, paclitaxel.  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  - received adjuvant radiation therapy and tamoxifen developed recurrent disease in March 201 - bone scan with multiple mets in appendicular area and axillary area, unchanged from prior study   Diffuse pain  Continues to improve. Transitioned from PCA. Currently on methadone with dilaudid prn - continue analgesic regimen - continue to follow palliative recommendations  Left labial area (vaginal) boil Healing - monitor for symptoms  Pancytopenia  Resolving. Hemoglobin stable. WBC and platelets wnl - possibly related to treatment with Palbociclib.  - CBC in AM  Hypokalemia  - supplemented and WNL this AM  Pre renal azotemia - resolved   DVT prophylaxis: Lovenox SQ Code Status: Full  Family Communication: Patient at bedside  Disposition Plan: Home in few days, once off iv pain meds and changed to oral meds   Consultants:   Oncology   PCT for pain management   Procedures:   None   Antimicrobials:    None  Subjective: Pain around her neck and down thoracic spine. No other complaints overnight  Objective: Vitals:   01/11/16 2112 01/12/16 0200 01/12/16 0344 01/12/16 0656  BP: (!) 143/93 (!) 180/99 122/80 125/71  Pulse: 70 87 88 68  Resp: _0 Temp: 98.4 F (36.9 C) 98.6 F (37 C)  97.9 F (36.6 C)  TempSrc: Oral Oral  Oral  SpO2: 100% 100%  99%  Weight:      Height:        Intake/Output Summary (Last 24 hours) at 01/12/16 0749 Last data filed at 01/11/16 1614  Gross per 24 hour  Intake              240 ml  Output              800 ml  Net             -560 ml   Filed Weights   01/07/16 1841 01/08/16 0015  Weight: 78.5 kg (173 lb) 78.2 kg (172 lb 8 oz)    Examination:  General exam: Alert, eating breakfast. No distress.  Respiratory system: Respiratory effort normal. Diminished breath sounds at bases  Cardiovascular system: S1 & S2 heard, RRR. No JVD, murmurs, rubs, gallops or clicks.  Gastrointestinal system: Abdomen is nondistended, soft and nontender.    Data Reviewed: I have personally reviewed following labs and imaging studies  CBC:  Recent Labs Lab 01/07/16 1509 01/08/16 0356 01/09/16 0418 01/10/16 0340 01/12/16 0400  WBC 3.3* 3.3* 4.9 5.9 6.4  NEUTROABS 2.5  --   --   --   --  HGB 8.7* 7.7* 8.1* 8.3* 8.5*  HCT 26.8* 23.9* 24.9* 26.1* 25.8*  MCV 98.5 98.8 97.6 98.5 97.7  PLT 109* 102* 119* 126* 878   Basic Metabolic Panel:  Recent Labs Lab 01/08/16 0356 01/09/16 0418 01/10/16 0340 01/11/16 0515 01/12/16 0400  NA 139 140 138 142 137  K 4.0 4.4 4.4 4.1 4.1  CL 107 108 109 109 104  CO2 24 22 21* 25 23  GLUCOSE 150* 189* 208* 105* 266*  BUN _0 CREATININE 1.03* 1.04* 0.84 0.96 1.10*  CALCIUM 9.1 9.2 9.0 9.0 8.6*  MG 1.9  --   --   --   --   PHOS 4.2  --   --   --   --     Recent Labs Lab 01/07/16 1509 01/08/16 0356  AST 63* 51*  ALT 32 30  ALKPHOS 196* 173*  BILITOT 0.4 0.4  PROT 8.0 6.9  ALBUMIN 3.9 3.6    CBG:  Recent Labs Lab 01/11/16 1159 01/11/16 1731 01/11/16 2106 01/11/16 2110 01/12/16 0005  GLUCAP 172* 250* 430* 447* 238*   Thyroid Function Tests: No results for input(s): TSH, T4TOTAL, FREET4, T3FREE, THYROIDAB in the last 72 hours. Urine analysis:    Component Value Date/Time   COLORURINE YELLOW 01/07/2016 1450   APPEARANCEUR CLEAR 01/07/2016 1450   LABSPEC 1.014 01/07/2016 1450   PHURINE 6.5 01/07/2016 1450   GLUCOSEU NEGATIVE 01/07/2016 1450   HGBUR NEGATIVE 01/07/2016 1450   BILIRUBINUR NEGATIVE 01/07/2016 1450   KETONESUR NEGATIVE 01/07/2016 1450   PROTEINUR NEGATIVE 01/07/2016 1450   UROBILINOGEN 0.2 01/04/2012 0246   NITRITE NEGATIVE 01/07/2016 1450   LEUKOCYTESUR NEGATIVE 01/07/2016 1450    Recent Results (from the past 240 hour(s))  Urine culture     Status: Abnormal   Collection Time: 01/07/16  2:50 PM  Result Value Ref Range Status   Specimen Description URINE, CLEAN CATCH  Final   Special Requests NONE  Final   Culture (A)  Final    <10,000 COLONIES/mL INSIGNIFICANT GROWTH Performed at [facility]    Report Status 01/08/2016 FINAL  Final  Culture, blood (Routine X 2)     Status: None (Preliminary result)   Collection Time: 01/07/16  3:09 PM  Result Value Ref Range Status   Specimen Description BLOOD RIGHT ANTECUBITAL  Final   Special Requests BOTTLES DRAWN AEROBIC AND ANAEROBIC 7ML  Final   Culture   Final    NO GROWTH 4 DAYS Performed at [facility]    Report Status PENDING  Incomplete  Culture, blood (Routine X 2)     Status: None (Preliminary result)   Collection Time: 01/07/16  3:29 PM  Result Value Ref Range Status   Specimen Description BLOOD RIGHT HAND  Final   Special Requests BOTTLES DRAWN AEROBIC AND ANAEROBIC 5ML  Final   Culture   Final    NO GROWTH 4 DAYS Performed at [facility]    Report Status PENDING  Incomplete      Radiology Studies: Nm Bone Scan Whole Body  Result Date:  01/10/2016 CLINICAL DATA:  History of breast carcinoma. Evaluate bone metastatic disease. EXAM: NUCLEAR MEDICINE WHOLE BODY BONE SCAN TECHNIQUE: Whole body anterior and posterior images were obtained approximately 3 hours after intravenous injection of radiopharmaceutical. RADIOPHARMACEUTICALS:  23.4 mCi Technetium-83mMDP IV COMPARISON:  Bone scan, 11/11/2015 and 07/28/2015. FINDINGS: Uptake is again noted involving skull, right clavicle and scapula, multiple ribs, the sternum, pelvis, both femurs  and subtly within both humeri. There are no definite new bone lesions when compared to the most recent prior study. The pattern of uptake appears stable. Renal uptake is minimally evident. IMPRESSION: 1. Multiple areas of metastatic disease to the axillary and appendicular skeleton, without significant change from the most recent prior study. Electronically Signed   By: Lajean Manes M.D.   On: 01/10/2016 16:22      Scheduled Meds: . amphetamine-dextroamphetamine  10 mg Oral BID WC  . dexamethasone  20 mg Intravenous Q24H  . enoxaparin (LOVENOX) injection  40 mg Subcutaneous Q24H  . ferrous sulfate  325 mg Oral Q breakfast  . FLUoxetine  20 mg Oral Daily  . gabapentin  300 mg Oral 2 times per day  . gabapentin  600 mg Oral QHS  . insulin aspart  0-15 Units Subcutaneous TID WC  . insulin aspart  0-5 Units Subcutaneous QHS  . letrozole  2.5 mg Oral Daily  . methadone  25 mg Oral QID  . nicotine  21 mg Transdermal Daily  . pantoprazole  40 mg Oral Daily  . polyethylene glycol  17 g Oral Daily  . senna  1 tablet Oral BID  . thiamine  100 mg Oral Daily   Continuous Infusions:    LOS: 4 days       Cordelia Poche, MD [facility]  If 7PM-7AM, please contact night-coverage [email or web address] Password [email or web address] 01/12/2016, 7:49 AM

## 2016-01-12 NOTE — Progress Notes (Signed)
Nursing Note: Pt f/u cbg is 238.wbb

## 2016-01-13 ENCOUNTER — Encounter (HOSPITAL_COMMUNITY): Payer: Self-pay

## 2016-01-13 LAB — GLUCOSE, CAPILLARY
GLUCOSE-CAPILLARY: 122 mg/dL — AB (ref 65–99)
GLUCOSE-CAPILLARY: 172 mg/dL — AB (ref 65–99)
GLUCOSE-CAPILLARY: 208 mg/dL — AB (ref 65–99)
GLUCOSE-CAPILLARY: 297 mg/dL — AB (ref 65–99)
Glucose-Capillary: 236 mg/dL — ABNORMAL HIGH (ref 65–99)

## 2016-01-13 MED ORDER — SIMETHICONE 80 MG PO CHEW
160.0000 mg | CHEWABLE_TABLET | Freq: Four times a day (QID) | ORAL | Status: DC | PRN
  Administered 2016-01-13 – 2016-01-15 (×2): 160 mg via ORAL
  Filled 2016-01-13 (×2): qty 2

## 2016-01-13 NOTE — Progress Notes (Signed)
PHYSICAL THERAPY  Pt having a lot of pain today.  Will check back another day as schedule permits. Rica Koyanagi  PTA [facility]  Acute  Rehab Pager      [phone]

## 2016-01-14 LAB — GLUCOSE, CAPILLARY
Glucose-Capillary: 188 mg/dL — ABNORMAL HIGH (ref 65–99)
Glucose-Capillary: 191 mg/dL — ABNORMAL HIGH (ref 65–99)
Glucose-Capillary: 410 mg/dL — ABNORMAL HIGH (ref 65–99)
Glucose-Capillary: 380 mg/dL — ABNORMAL HIGH (ref 65–99)

## 2016-01-14 LAB — BASIC METABOLIC PANEL
Anion gap: 7 (ref 5–15)
BUN: 20 mg/dL (ref 6–20)
CHLORIDE: 105 mmol/L (ref 101–111)
CO2: 26 mmol/L (ref 22–32)
CREATININE: 1.13 mg/dL — AB (ref 0.44–1.00)
Calcium: 8.7 mg/dL — ABNORMAL LOW (ref 8.9–10.3)
GFR calc Af Amer: >60 mL/min (ref >60)
GFR calc non Af Amer: 57 mL/min — ABNORMAL LOW (ref >60)
Glucose, Bld: 96 mg/dL (ref 65–99)
Potassium: 5.4 mmol/L — ABNORMAL HIGH (ref 3.5–5.1)
Sodium: 138 mmol/L (ref 135–145)

## 2016-01-14 LAB — CBC
HCT: 27.2 % — ABNORMAL LOW (ref 36.0–46.0)
Hemoglobin: 8.9 g/dL — ABNORMAL LOW (ref 12.0–15.0)
MCH: 31.8 pg (ref 26.0–34.0)
MCHC: 32.7 g/dL (ref 30.0–36.0)
MCV: 97.1 fL (ref 78.0–100.0)
PLATELETS: 195 10*3/uL (ref 150–400)
RBC: 2.8 MIL/uL — ABNORMAL LOW (ref 3.87–5.11)
RDW: 18.6 % — AB (ref 11.5–15.5)
WBC: 6.3 10*3/uL (ref 4.0–10.5)

## 2016-01-14 NOTE — Progress Notes (Signed)
Physical Therapy Treatment Patient Details Name: TRUDY KORY MRN: [id number] DOB: [birth year] Today's Date: 01/14/2016    History of Present Illness 45 yo female admitted with DM, intractable pain. Hx of DM, met breast ca with bony mets, sarcoidosis, HTN, chronic anemia    PT Comments    Pt feeling "much better" today.  Pt is able to get self OOB to bathroom with her cane and tolerated a full lap around unit.  Slow gait.  Pt c/o poor vision "from the Steroids".    Follow Up Recommendations  No PT follow up;Supervision - Intermittent     Equipment Recommendations  None recommended by PT    Recommendations for Other Services       Precautions / Restrictions Precautions Precautions: Fall Restrictions Weight Bearing Restrictions: No    Mobility  Bed Mobility Overal bed mobility: Modified Independent                Transfers Overall transfer level: Modified independent Equipment used: None             General transfer comment: good safety cognition and awareness  Ambulation/Gait Ambulation/Gait assistance: Supervision Ambulation Distance (Feet): 500 Feet Assistive device: 1 person hand held assist;Straight cane Gait Pattern/deviations: Step-through pattern;Wide base of support Gait velocity: WFL   General Gait Details: slow gait with x 2 R toe trip self adjusted/corrected/quick responce.  Pt wearing "flip flops" and demonstarting minimal foot to floor clearance.    Stairs            Wheelchair Mobility    Modified Rankin (Stroke Patients Only)       Balance                                    Cognition Arousal/Alertness: Awake/alert Behavior During Therapy: WFL for tasks assessed/performed Overall Cognitive Status: Within Functional Limits for tasks assessed                      Exercises      General Comments        Pertinent Vitals/Pain Pain Assessment: Faces Faces Pain Scale: Hurts a little bit Pain  Location: B hips "earlier" Pain Descriptors / Indicators: Dull Pain Intervention(s): Monitored during session;Repositioned    Home Living                      Prior Function            PT Goals (current goals can now be found in the care plan section) Progress towards PT goals: Progressing toward goals    Frequency  Min 3X/week    PT Plan Current plan remains appropriate    Co-evaluation             End of Session Equipment Utilized During Treatment: Gait belt   Patient left: in bed;with call bell/phone within reach;with family/visitor present     Time: 0347-4259 PT Time Calculation (min) (ACUTE ONLY): 11 min  Charges:  $Gait Training: 8-22 mins                    G Codes:      Rica Koyanagi  PTA [facility]  Acute  Rehab Pager      [phone]

## 2016-01-14 NOTE — Progress Notes (Addendum)
Patient ID: Shelly Hall, female   DOB: 11/02/1970, 45 y.o.   MRN: [id number] Late Entry   PROGRESS NOTE    ASHLIN KREPS  [id number] DOB: 04-12-1971 DOA: 01/07/2016  PCP: Elyn Peers, MD   Brief Narrative:  87 year old woman with metastatic breast cancer to the bone without any evidence of visceral metastasis. She is currently receiving therapy under the care of Dr. Jana Hakim with letrozole, palbociclib, zolendronate. She has chronic pain issues related to her diffuse bony metastasis and had been chronically on methadone.  She presented to the emergency department on 01/07/2016 with complaints of increased pain and was started on Dilaudid PCA and given dose of dexamethasone.  Assessment & Plan:  Back pain  Continues to improve. Currently on methadone with methadone and dilaudid prn - continue analgesic regimen - continue to follow palliative recommendations  Left labial area (vaginal) boil Healing - monitor for symptoms  Pancytopenia  Resolving. Hemoglobin stable. WBC and platelets wnl - possibly related to treatment with Palbociclib.  - CBC in AM  Hypokalemia  Repleted - will repeat BMP in AM  Pre renal azotemia - resolved   Breast cancer long-standing history dating back to 2015 - BRCA negative hormone positive breast cancer - status post neoadjuvant chemotherapy with Adriamycin and cyclophosphamide, paclitaxel.  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  - received adjuvant radiation therapy and tamoxifen developed recurrent disease in March 201 - bone scan with multiple mets in appendicular area and axillary area, unchanged from prior study    DVT prophylaxis: Lovenox SQ Code Status: Full  Family Communication: Patient at bedside  Disposition Plan: Home in few days, once off iv pain meds and changed to oral meds   Consultants:   Oncology   PCT for pain management   Procedures:   None    Antimicrobials:   None  Subjective: Pain improved since transitioning to methadone. No other complaints.  Objective: Vitals:   01/13/16 0656 01/13/16 1528 01/13/16 2102 01/14/16 0520  BP: (!) 137/92 (!) 153/97 107/78 108/71  Pulse: 72 73 (!) 104 90  Resp: _0 Temp: 98.3 F (36.8 C) 98.2 F (36.8 C) 98 F (36.7 C) 98.2 F (36.8 C)  TempSrc: Oral Oral Oral Oral  SpO2: 94% 100% 98% 100%  Weight:      Height:        Intake/Output Summary (Last 24 hours) at 01/14/16 0922 Last data filed at 01/14/16 0521  Gross per 24 hour  Intake              720 ml  Output                0 ml  Net              720 ml   Filed Weights   01/07/16 1841 01/08/16 0015  Weight: 78.5 kg (173 lb) 78.2 kg (172 lb 8 oz)    Examination:  General exam: Alert, eating breakfast. No distress.  Respiratory system: Respiratory effort normal. Diminished breath sounds at bases  Cardiovascular system: S1 & S2 heard, RRR. No JVD, murmurs, rubs, gallops or clicks.  Gastrointestinal system: Abdomen is nondistended, soft and nontender.    Data Reviewed: I have personally reviewed following labs and imaging studies  CBC:  Recent Labs Lab 01/07/16 1509 01/08/16 0356 01/09/16 0418 01/10/16 0340 01/12/16 0400  WBC 3.3* 3.3* 4.9 5.9 6.4  NEUTROABS 2.5  --   --   --   --  HGB 8.7* 7.7* 8.1* 8.3* 8.5*  HCT 26.8* 23.9* 24.9* 26.1* 25.8*  MCV 98.5 98.8 97.6 98.5 97.7  PLT 109* 102* 119* 126* 413   Basic Metabolic Panel:  Recent Labs Lab 01/08/16 0356 01/09/16 0418 01/10/16 0340 01/11/16 0515 01/12/16 0400  NA 139 140 138 142 137  K 4.0 4.4 4.4 4.1 4.1  CL 107 108 109 109 104  CO2 24 22 21* 25 23  GLUCOSE 150* 189* 208* 105* 266*  BUN _0 CREATININE 1.03* 1.04* 0.84 0.96 1.10*  CALCIUM 9.1 9.2 9.0 9.0 8.6*  MG 1.9  --   --   --   --   PHOS 4.2  --   --   --   --     Recent Labs Lab 01/07/16 1509 01/08/16 0356  AST 63* 51*  ALT 32 30  ALKPHOS 196* 173*   BILITOT 0.4 0.4  PROT 8.0 6.9  ALBUMIN 3.9 3.6   CBG:  Recent Labs Lab 01/13/16 1136 01/13/16 1328 01/13/16 1746 01/13/16 2055  GLUCAP 172* 236* 208* 297*   Thyroid Function Tests: No results for input(s): TSH, T4TOTAL, FREET4, T3FREE, THYROIDAB in the last 72 hours. Urine analysis:    Component Value Date/Time   COLORURINE YELLOW 01/07/2016 1450   APPEARANCEUR CLEAR 01/07/2016 1450   LABSPEC 1.014 01/07/2016 1450   PHURINE 6.5 01/07/2016 1450   GLUCOSEU NEGATIVE 01/07/2016 1450   HGBUR NEGATIVE 01/07/2016 1450   BILIRUBINUR NEGATIVE 01/07/2016 1450   KETONESUR NEGATIVE 01/07/2016 1450   PROTEINUR NEGATIVE 01/07/2016 1450   UROBILINOGEN 0.2 01/04/2012 0246   NITRITE NEGATIVE 01/07/2016 1450   LEUKOCYTESUR NEGATIVE 01/07/2016 1450    Recent Results (from the past 240 hour(s))  Urine culture     Status: Abnormal   Collection Time: 01/07/16  2:50 PM  Result Value Ref Range Status   Specimen Description URINE, CLEAN CATCH  Final   Special Requests NONE  Final   Culture (A)  Final    <10,000 COLONIES/mL INSIGNIFICANT GROWTH Performed at [facility]    Report Status 01/08/2016 FINAL  Final  Culture, blood (Routine X 2)     Status: None   Collection Time: 01/07/16  3:09 PM  Result Value Ref Range Status   Specimen Description BLOOD RIGHT ANTECUBITAL  Final   Special Requests BOTTLES DRAWN AEROBIC AND ANAEROBIC 7ML  Final   Culture   Final    NO GROWTH 5 DAYS Performed at [facility]    Report Status 01/12/2016 FINAL  Final  Culture, blood (Routine X 2)     Status: None   Collection Time: 01/07/16  3:29 PM  Result Value Ref Range Status   Specimen Description BLOOD RIGHT HAND  Final   Special Requests BOTTLES DRAWN AEROBIC AND ANAEROBIC 5ML  Final   Culture   Final    NO GROWTH 5 DAYS Performed at [facility]    Report Status 01/12/2016 FINAL  Final      Radiology Studies: No results found.    Scheduled Meds: .  amphetamine-dextroamphetamine  10 mg Oral BID WC  . dexamethasone  20 mg Intravenous Q24H  . enoxaparin (LOVENOX) injection  40 mg Subcutaneous Q24H  . ferrous sulfate  325 mg Oral Q breakfast  . FLUoxetine  20 mg Oral Daily  . gabapentin  300 mg Oral 2 times per day  . gabapentin  600 mg Oral QHS  . insulin aspart  0-15 Units Subcutaneous TID WC  .  insulin aspart  0-5 Units Subcutaneous QHS  . letrozole  2.5 mg Oral Daily  . methadone  25 mg Oral QID  . nicotine  21 mg Transdermal Daily  . pantoprazole  40 mg Oral Daily  . polyethylene glycol  17 g Oral Daily  . senna  1 tablet Oral BID  . thiamine  100 mg Oral Daily   Continuous Infusions:    LOS: 5 days     Cordelia Poche, MD [facility]  If 7PM-7AM, please contact night-coverage [email or web address] Password [email or web address] 01/14/2016, 9:22 AM

## 2016-01-14 NOTE — Progress Notes (Signed)
Patient ID: Shelly Hall, female   DOB: [birth year], 45 y.o.   MRN: [id number]    PROGRESS NOTE    Shelly Hall  [id number] DOB: Mar 19, 1971 DOA: 01/07/2016  PCP: Elyn Peers, MD   Brief Narrative:  45 year old woman with metastatic breast cancer to the bone without any evidence of visceral metastasis. She is currently receiving therapy under the care of Dr. Jana Hakim with letrozole, palbociclib, zolendronate. She has chronic pain issues related to her diffuse bony metastasis and had been chronically on methadone.  She presented to the emergency department on 01/07/2016 with complaints of increased pain and was started on Dilaudid PCA and given dose of dexamethasone.  Assessment & Plan:   Diffuse pain  Improving. Patient still using dilaudid. - advised to use methadone prn before dilaudid - continue methadone per palliative care recommendations - continue to follow palliative recommendations  Left labial area (vaginal) boil Healing - monitor for symptoms  Pancytopenia  Resolving. Hemoglobin stable. WBC and platelets wnl - possibly related to treatment with Palbociclib.  - CBC in AM  Hypokalemia  Elevated today - repeat bmp tomorrow  Pre renal azotemia - resolved  Breast cancer long-standing history dating back to 2015 - BRCA negative hormone positive breast cancer - status post neoadjuvant chemotherapy with Adriamycin and cyclophosphamide, paclitaxel.  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  - received adjuvant radiation therapy and tamoxifen developed recurrent disease in March 201 - bone scan with multiple mets in appendicular area and axillary area, unchanged from prior study   DVT prophylaxis: Lovenox SQ Code Status: Full  Family Communication: Patient at bedside  Disposition Plan: Home in few days, once off iv pain meds and changed to oral meds   Consultants:   Oncology   PCT for pain management    Procedures:   None   Antimicrobials:   None  Subjective: Pain improved and she reports increased mobility. She is optimistic about discharge  Objective: Vitals:   01/13/16 0656 01/13/16 1528 01/13/16 2102 01/14/16 0520  BP: (!) 137/92 (!) 153/97 107/78 108/71  Pulse: 72 73 (!) 104 90  Resp: 16 20 20 20   Temp: 98.3 F (36.8 C) 98.2 F (36.8 C) 98 F (36.7 C) 98.2 F (36.8 C)  TempSrc: Oral Oral Oral Oral  SpO2: 94% 100% 98% 100%  Weight:      Height:        Intake/Output Summary (Last 24 hours) at 01/14/16 1333 Last data filed at 01/14/16 0521  Gross per 24 hour  Intake              480 ml  Output                0 ml  Net              480 ml   Filed Weights   01/07/16 1841 01/08/16 0015  Weight: 78.5 kg (173 lb) 78.2 kg (172 lb 8 oz)    Examination:  General exam: Alert, watching TV. No distress.  Respiratory system: Respiratory effort normal. Diminished breath sounds at bases  Cardiovascular system: S1 & S2 heard, RRR. No murmurs, rubs, gallops or clicks.  Gastrointestinal system: Abdomen is nondistended, soft and nontender.   Data Reviewed: I have personally reviewed following labs and imaging studies  CBC:  Recent Labs Lab 01/07/16 1509 01/08/16 0356 01/09/16 0418 01/10/16 0340 01/12/16 0400 01/14/16 0353  WBC 3.3* 3.3* 4.9 5.9 6.4 6.3  NEUTROABS 2.5  --   --   --   --   --   HGB 8.7* 7.7* 8.1* 8.3* 8.5* 8.9*  HCT 26.8* 23.9* 24.9* 26.1* 25.8* 27.2*  MCV 98.5 98.8 97.6 98.5 97.7 97.1  PLT 109* 102* 119* 126* 171 388   Basic Metabolic Panel:  Recent Labs Lab 01/08/16 0356 01/09/16 0418 01/10/16 0340 01/11/16 0515 01/12/16 0400 01/14/16 0353  NA 139 140 138 142 137 138  K 4.0 4.4 4.4 4.1 4.1 5.4*  CL 107 108 109 109 104 105  CO2 24 22 21* 25 23 26   GLUCOSE 150* 189* 208* 105* 266* 96  BUN 13 18 16 17 18 20   CREATININE 1.03* 1.04* 0.84 0.96 1.10* 1.13*  CALCIUM 9.1 9.2 9.0 9.0 8.6* 8.7*  MG 1.9  --   --   --   --   --   PHOS 4.2   --   --   --   --   --     Recent Labs Lab 01/07/16 1509 01/08/16 0356  AST 63* 51*  ALT 32 30  ALKPHOS 196* 173*  BILITOT 0.4 0.4  PROT 8.0 6.9  ALBUMIN 3.9 3.6   CBG:  Recent Labs Lab 01/13/16 1328 01/13/16 1746 01/13/16 2055 01/14/16 0753 01/14/16 1217  GLUCAP 236* 208* 297* 188* 191*   Thyroid Function Tests: No results for input(s): TSH, T4TOTAL, FREET4, T3FREE, THYROIDAB in the last 72 hours. Urine analysis:    Component Value Date/Time   COLORURINE YELLOW 01/07/2016 1450   APPEARANCEUR CLEAR 01/07/2016 1450   LABSPEC 1.014 01/07/2016 1450   PHURINE 6.5 01/07/2016 1450   GLUCOSEU NEGATIVE 01/07/2016 1450   HGBUR NEGATIVE 01/07/2016 1450   BILIRUBINUR NEGATIVE 01/07/2016 1450   KETONESUR NEGATIVE 01/07/2016 1450   PROTEINUR NEGATIVE 01/07/2016 1450   UROBILINOGEN 0.2 01/04/2012 0246   NITRITE NEGATIVE 01/07/2016 1450   LEUKOCYTESUR NEGATIVE 01/07/2016 1450    Recent Results (from the past 240 hour(s))  Urine culture     Status: Abnormal   Collection Time: 01/07/16  2:50 PM  Result Value Ref Range Status   Specimen Description URINE, CLEAN CATCH  Final   Special Requests NONE  Final   Culture (A)  Final    <10,000 COLONIES/mL INSIGNIFICANT GROWTH Performed at [facility]    Report Status 01/08/2016 FINAL  Final  Culture, blood (Routine X 2)     Status: None   Collection Time: 01/07/16  3:09 PM  Result Value Ref Range Status   Specimen Description BLOOD RIGHT ANTECUBITAL  Final   Special Requests BOTTLES DRAWN AEROBIC AND ANAEROBIC 7ML  Final   Culture   Final    NO GROWTH 5 DAYS Performed at [facility]    Report Status 01/12/2016 FINAL  Final  Culture, blood (Routine X 2)     Status: None   Collection Time: 01/07/16  3:29 PM  Result Value Ref Range Status   Specimen Description BLOOD RIGHT HAND  Final   Special Requests BOTTLES DRAWN AEROBIC AND ANAEROBIC 5ML  Final   Culture   Final    NO GROWTH 5 DAYS Performed at [facility]    Report Status 01/12/2016 FINAL  Final      Radiology Studies: No results found.    Scheduled Meds: . amphetamine-dextroamphetamine  10 mg Oral BID WC  . dexamethasone  20 mg Intravenous Q24H  . ferrous sulfate  325 mg Oral Q breakfast  . FLUoxetine  20 mg  Oral Daily  . gabapentin  300 mg Oral 2 times per day  . gabapentin  600 mg Oral QHS  . insulin aspart  0-15 Units Subcutaneous TID WC  . insulin aspart  0-5 Units Subcutaneous QHS  . letrozole  2.5 mg Oral Daily  . methadone  25 mg Oral QID  . nicotine  21 mg Transdermal Daily  . pantoprazole  40 mg Oral Daily  . polyethylene glycol  17 g Oral Daily  . senna  1 tablet Oral BID  . thiamine  100 mg Oral Daily   Continuous Infusions:    LOS: 6 days       Cordelia Poche, MD [facility]  If 7PM-7AM, please contact night-coverage [email or web address] Password [email or web address] 01/14/2016, 1:33 PM

## 2016-01-14 NOTE — Care Management Important Message (Signed)
Important Message  Patient Details  Name: Shelly Hall MRN: [id number] Date of Birth: 1970/10/22   Medicare Important Message Given:  Yes    [name] 01/14/2016, 9:06 AMImportant Message  Patient Details  Name: Shelly Hall MRN: [id number] Date of Birth: July 02, 1970   Medicare Important Message Given:  Yes    [name] 01/14/2016, 9:06 AM

## 2016-01-15 ENCOUNTER — Inpatient Hospital Stay (HOSPITAL_COMMUNITY): Payer: Medicare Other

## 2016-01-15 DIAGNOSIS — R140 Abdominal distension (gaseous): Secondary | ICD-10-CM

## 2016-01-15 DIAGNOSIS — C50919 Malignant neoplasm of unspecified site of unspecified female breast: Secondary | ICD-10-CM

## 2016-01-15 DIAGNOSIS — N949 Unspecified condition associated with female genital organs and menstrual cycle: Secondary | ICD-10-CM

## 2016-01-15 LAB — GLUCOSE, CAPILLARY
GLUCOSE-CAPILLARY: 123 mg/dL — AB (ref 65–99)
GLUCOSE-CAPILLARY: 373 mg/dL — AB (ref 65–99)
Glucose-Capillary: 169 mg/dL — ABNORMAL HIGH (ref 65–99)
Glucose-Capillary: 352 mg/dL — ABNORMAL HIGH (ref 65–99)

## 2016-01-15 LAB — BASIC METABOLIC PANEL
Anion gap: 9 (ref 5–15)
BUN: 20 mg/dL (ref 6–20)
CHLORIDE: 101 mmol/L (ref 101–111)
CO2: 24 mmol/L (ref 22–32)
CREATININE: 1.27 mg/dL — AB (ref 0.44–1.00)
Calcium: 8.6 mg/dL — ABNORMAL LOW (ref 8.9–10.3)
GFR, EST AFRICAN AMERICAN: 58 mL/min — AB (ref >60)
GFR, EST NON AFRICAN AMERICAN: 50 mL/min — AB (ref >60)
Glucose, Bld: 372 mg/dL — ABNORMAL HIGH (ref 65–99)
POTASSIUM: 5.1 mmol/L (ref 3.5–5.1)
SODIUM: 134 mmol/L — AB (ref 135–145)

## 2016-01-15 MED ORDER — MILK AND MOLASSES ENEMA
1.0000 | Freq: Once | RECTAL | Status: AC
  Administered 2016-01-15: 250 mL via RECTAL
  Filled 2016-01-15: qty 250

## 2016-01-15 NOTE — Progress Notes (Signed)
Patient ID: Shelly Hall, female   DOB: 06-28-1970, 45 y.o.   MRN: [id number]    PROGRESS NOTE    Shelly Hall  XEN:[id number] DOB: 07/31/1970 DOA: 01/07/2016  PCP: Elyn Peers, MD   Brief Narrative:  24 year old woman with metastatic breast cancer to the bone without any evidence of visceral metastasis. She is currently receiving therapy under the care of Dr. Jana Hakim with letrozole, palbociclib, zolendronate. She has chronic pain issues related to her diffuse bony metastasis and had been chronically on methadone.  She presented to the emergency department on 01/07/2016 with complaints of increased pain and was started on Dilaudid PCA and given dose of dexamethasone.  Assessment & Plan:   Diffuse pain  Improving. Patient still using dilaudid. Patient motivated to go home. - Continue to advise patient to use methadone prn - Discontinue Dilaudid IV - continue methadone per palliative care recommendations - continue to follow palliative recommendations  Abdominal distension likely secondary to constipation even though patient is having daily soft stools. Patient reports associated discomfort. -Obtain a KUB  Left labial area (vaginal) boil There is some reaccumulation. Incision has closed. No associated cellulitis -warm compresses  Pancytopenia  Resolved. Hemoglobin stable. WBC and platelets wnl - possibly related to treatment with Palbociclib.   Hypokalemia  Elevated yesterday - repeat bmp now  Breast cancer long-standing history dating back to 2015 - BRCA negative hormone positive breast cancer - status post neoadjuvant chemotherapy with Adriamycin and cyclophosphamide, paclitaxel.  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  - received adjuvant radiation therapy and tamoxifen developed recurrent disease in March 201 - bone scan with multiple mets in appendicular area and axillary area, unchanged from prior study   DVT  prophylaxis: Lovenox SQ Code Status: Full  Family Communication: No family at bedside  Disposition Plan: Home in few days, once off iv pain meds and changed to oral meds   Consultants:   Oncology   PCT for pain management   Procedures:   None   Antimicrobials:   None  Subjective: Patient states that the pain was a little worse this morning. She is wanting to go home. Just complains of some abdominal distention in addition to discomfort and gas. She reports having a bowel movement yesterday that was soft. She required MiraLAX and prune juice in order to have that bowel movement.  Objective: Vitals:   01/14/16 1742 01/14/16 2050 01/15/16 0510 01/15/16 1330  BP: 129/78 (!) 133/99 125/86 (!) 141/93  Pulse: 100 92 82 95  Resp: 18 16 14 16   Temp: 97.6 F (36.4 C) 98 F (36.7 C) 97.8 F (36.6 C) 98.4 F (36.9 C)  TempSrc: Oral Oral Oral Oral  SpO2: 100% 99% 98% 98%  Weight:      Height:        Intake/Output Summary (Last 24 hours) at 01/15/16 1452 Last data filed at 01/15/16 1331  Gross per 24 hour  Intake             1355 ml  Output                0 ml  Net             1355 ml   Filed Weights   01/07/16 1841 01/08/16 0015  Weight: 78.5 kg (173 lb) 78.2 kg (172 lb 8 oz)    Examination:  General exam: Alert. No distress.  Respiratory system: Respiratory effort normal. Diminished breath  sounds at bases  Cardiovascular system: S1 & S2 heard, RRR. No murmurs, rubs, gallops or clicks.  Gastrointestinal system: Abdomen is slightly distended, soft and nontender. Bowel sounds normal  Data Reviewed: I have personally reviewed following labs and imaging studies  CBC:  Recent Labs Lab 01/09/16 0418 01/10/16 0340 01/12/16 0400 01/14/16 0353  WBC 4.9 5.9 6.4 6.3  HGB 8.1* 8.3* 8.5* 8.9*  HCT 24.9* 26.1* 25.8* 27.2*  MCV 97.6 98.5 97.7 97.1  PLT 119* 126* 171 449   Basic Metabolic Panel:  Recent Labs Lab 01/09/16 0418 01/10/16 0340 01/11/16 0515  01/12/16 0400 01/14/16 0353  NA 140 138 142 137 138  K 4.4 4.4 4.1 4.1 5.4*  CL 108 109 109 104 105  CO2 22 21* 25 23 26   GLUCOSE 189* 208* 105* 266* 96  BUN 18 16 17 18 20   CREATININE 1.04* 0.84 0.96 1.10* 1.13*  CALCIUM 9.2 9.0 9.0 8.6* 8.7*   No results for input(s): AST, ALT, ALKPHOS, BILITOT, PROT, ALBUMIN in the last 168 hours. CBG:  Recent Labs Lab 01/14/16 1217 01/14/16 1701 01/14/16 2049 01/15/16 0749 01/15/16 1158  GLUCAP 191* 410* 380* 169* 123*   Thyroid Function Tests: No results for input(s): TSH, T4TOTAL, FREET4, T3FREE, THYROIDAB in the last 72 hours. Urine analysis:    Component Value Date/Time   COLORURINE YELLOW 01/07/2016 1450   APPEARANCEUR CLEAR 01/07/2016 1450   LABSPEC 1.014 01/07/2016 1450   PHURINE 6.5 01/07/2016 1450   GLUCOSEU NEGATIVE 01/07/2016 1450   HGBUR NEGATIVE 01/07/2016 1450   BILIRUBINUR NEGATIVE 01/07/2016 1450   KETONESUR NEGATIVE 01/07/2016 1450   PROTEINUR NEGATIVE 01/07/2016 1450   UROBILINOGEN 0.2 01/04/2012 0246   NITRITE NEGATIVE 01/07/2016 1450   LEUKOCYTESUR NEGATIVE 01/07/2016 1450    Recent Results (from the past 240 hour(s))  Urine culture     Status: Abnormal   Collection Time: 01/07/16  2:50 PM  Result Value Ref Range Status   Specimen Description URINE, CLEAN CATCH  Final   Special Requests NONE  Final   Culture (A)  Final    <10,000 COLONIES/mL INSIGNIFICANT GROWTH Performed at [facility]    Report Status 01/08/2016 FINAL  Final  Culture, blood (Routine X 2)     Status: None   Collection Time: 01/07/16  3:09 PM  Result Value Ref Range Status   Specimen Description BLOOD RIGHT ANTECUBITAL  Final   Special Requests BOTTLES DRAWN AEROBIC AND ANAEROBIC 7ML  Final   Culture   Final    NO GROWTH 5 DAYS Performed at [facility]    Report Status 01/12/2016 FINAL  Final  Culture, blood (Routine X 2)     Status: None   Collection Time: 01/07/16  3:29 PM  Result Value Ref Range Status    Specimen Description BLOOD RIGHT HAND  Final   Special Requests BOTTLES DRAWN AEROBIC AND ANAEROBIC 5ML  Final   Culture   Final    NO GROWTH 5 DAYS Performed at [facility]    Report Status 01/12/2016 FINAL  Final      Radiology Studies: Dg Abd 1 View  Result Date: 01/15/2016 CLINICAL DATA:  Patient with abdominal pain, constipation and distention. EXAM: ABDOMEN - 1 VIEW COMPARISON:  CT abdomen pelvis 08/19/2015. FINDINGS: The lung bases are clear. There is a large amount of stool throughout the colon. Gas is demonstrated within nondilated loops of large and small bowel in a nonobstructed pattern. Innumerable lucent lesions throughout the visualized skeleton. IMPRESSION:  Large amount of stool throughout the colon as can be seen with constipation. Lucent lesions throughout the visualized skeleton most compatible with metastatic disease. Electronically Signed   By: Lovey Newcomer M.D.   On: 01/15/2016 10:58      Scheduled Meds: . amphetamine-dextroamphetamine  10 mg Oral BID WC  . dexamethasone  20 mg Intravenous Q24H  . ferrous sulfate  325 mg Oral Q breakfast  . FLUoxetine  20 mg Oral Daily  . gabapentin  300 mg Oral 2 times per day  . gabapentin  600 mg Oral QHS  . insulin aspart  0-15 Units Subcutaneous TID WC  . insulin aspart  0-5 Units Subcutaneous QHS  . letrozole  2.5 mg Oral Daily  . methadone  25 mg Oral QID  . nicotine  21 mg Transdermal Daily  . pantoprazole  40 mg Oral Daily  . polyethylene glycol  17 g Oral Daily  . senna  1 tablet Oral BID  . thiamine  100 mg Oral Daily   Continuous Infusions:    LOS: 7 days     Cordelia Poche, MD [facility] 01/15/2016, 2:57 PM Pager: [phone]  If 7PM-7AM, please contact night-coverage [email or web address] Password [facility] 01/15/2016, 2:52 PM

## 2016-01-15 NOTE — Progress Notes (Signed)
Shelly Hall  [phone]) [phone] [phone]) [phone]    ID: Shelly Hall OB: Jun 08, 1970  MR#: [id number]  XLK#:[id number]  PCP: Shelly Peers, MD GYN:  Shelly Hall SU: Shelly Hall OTHER MD: Shelly Hall  CHIEF COMPLAINT:  Left Breast Cancer, estrogen receptor positive  CURRENT TREATMENT: letrozole, palbociclib, zolendronate  BREAST CANCER HISTORY: From the original intake note:  Shelly Hall palpated a mass in her left breast mid-April 2015 and immediately brought it to Dr. Benjie Karvonen' attention. She was set up for bilateral screening mammography with tomography at [facility] hospital 09/19/2013. The possible distortion in the left breast and a nearby group of calcifications was felt to warrant further evaluation, and on 09/30/2013 she underwent unilateral left diagnostic mammography and ultrasonography. There was an irregular mass in the outer left breast associated with microcalcifications. 4 cm anterior to this there was another cluster of pleomorphic calcifications spanning 7 mm. He had more anteriorly there was an irregular mass associated with other calcifications. In total the abnormalities in the left breast spent approximately 10 cm, and is segmental distribution. On exam there was a palpable firm mass at the 2:30 o'clock position of the left breast 10 cm from the nipple. There was palpable adenopathy in the inferior left axilla. Ultrasound confirmed an irregular hypoechoic mass in the left breast measuring 4.6 cm. In addition, a 1.4 cm oval slightly irregular mass was noted at the 3:00 position and yet another mass measured 9 mm in the same area. Ultrasound of the left axilla showed a dominant 3.6 cm lymph node.  Biopsy of 2 of the left breast masses and the suspicious left breast lymph node on 10/03/2013 showed ([id number]) an invasive ductal carcinoma, grade 2, estrogen receptor 90% positive with strong staining intensity, progesterone receptor 40% positive but moderate  staining intensity, with an MIB-1 of 20% and no HER-2 amplification. (Because all 3 biopsies were morphologically identical, only one prognostic panel was sent).  On 10/10/2013 the patient underwent bilateral breast MRI. This showed numerous enhancing masses in the left breast, the largest measuring 3.5 cm, and the aggregate measuring 12.7 cm. There were numerous enlarged left axillary lymph nodes, at least 5 of which were enlarged, both at levels 1 and 2. The largest measured 2.3 cm. The right breast was negative.  The patient's subsequent history is as detailed below.  INTERVAL HISTORY: [name] tells me her pain is worse this AM. In general she feels she is making progress at pain control and is looking forward to discharge home "soon." She is having BMs. She is able to ambulate. She has a "spot" in her genital area that is causing her discomfort-- this is not new but is worse, she says. Her appetite is down and food "doesn't taste good." No family in room  REVIEW OF SYSTEMS: Aside from the above she denies unusual h/a, visual changes, cough, phlegm production or pleurisy. She is tolerating the letrozole w/o side effects. A detailed ROS was otherwise stable  PAST MEDICAL HISTORY: Past Medical History:  Diagnosis Date  . Anemia   . Anxiety    no meds currently  . Breast cancer ([place]) 2015   left upper outer;  had chemo  . Bronchitis 2014  . Complication of anesthesia    makes pt. restless and unable to be still  . Depression    no meds currently  . GERD (gastroesophageal reflux disease)   . Heart murmur    never had any problems as adult  . High cholesterol   .  Hypertension   . Radiation 06/23/14-08/11/14   Invasive ductal ca o left breast  . Sarcoidosis ([place])    no problems per patient   . SVD (spontaneous vaginal delivery)    x 2  . Thyroid cancer ([place]) 2015  . Type II diabetes mellitus ([place])     PAST SURGICAL HISTORY: Past Surgical History:  Procedure Laterality Date  .  ABDOMINAL HYSTERECTOMY    . BREAST BIOPSY Left 2015 X 3   biopsy  . ENDOBRONCHIAL ULTRASOUND Bilateral 12/02/2012   Procedure: ENDOBRONCHIAL ULTRASOUND;  Surgeon: Collene Gobble, MD;  Location: [facility];  Service: Cardiopulmonary;  Laterality: Bilateral;  . LAPAROSCOPIC ASSISTED VAGINAL HYSTERECTOMY N/A 12/09/2014   Procedure: LAPAROSCOPIC ASSISTED VAGINAL HYSTERECTOMY;  Surgeon: Cheri Fowler, MD;  Location: [facility];  Service: Gynecology;  Laterality: N/A;  . LEEP    . LUNG BIOPSY  2014  . MASTECTOMY W/ SENTINEL NODE BIOPSY Left 04/23/2014   & axillary LND  . MASTECTOMY W/ SENTINEL NODE BIOPSY Left 04/23/2014   Procedure: LEFT BREAST MASTECTOMY WITH SENTINEL LYMPH NODE BIOPSY AND AXILLARY LYMPH NODE DISECTION;  Surgeon: Shelly Klein, MD;  Location: [facility];  Service: General;  Laterality: Left;  . PORT-A-CATH REMOVAL Left 02/03/2015   Procedure: REMOVAL PORT-A-CATH;  Surgeon: Shelly Klein, MD;  Location: [facility];  Service: General;  Laterality: Left;  . PORTACATH PLACEMENT N/A 10/22/2013   Procedure: INSERTION PORT-A-CATH;  Surgeon: Shelly Klein, MD;  Location: [facility];  Service: General;  Laterality: N/A;  . SALPINGOOPHORECTOMY Bilateral 12/09/2014   Procedure: SALPINGO OOPHORECTOMY;  Surgeon: Cheri Fowler, MD;  Location: [facility];  Service: Gynecology;  Laterality: Bilateral;  . THYROID LOBECTOMY Left 04/23/2014   Procedure: LEFT THYROID LOBECTOMY;  Surgeon: Shelly Klein, MD;  Location: [facility];  Service: General;  Laterality: Left;  . TUBAL LIGATION  2001  . WISDOM TOOTH EXTRACTION      FAMILY HISTORY Family History  Problem Relation Age of Onset  . Cancer Paternal Aunt     "bone cancer"; deceased 82s  . Cancer Paternal Uncle     stomach cancer; deceased 72s  . Cancer Paternal Aunt     unk. primary; currently 51s  . Asthma Mother   . Prostate cancer Maternal Grandfather 15  The patient's parents are living, in fair mid to late 24s. The patient had one brother, no  sisters. There is no history of breast or ovarian cancer in the family to her knowledge.   GYNECOLOGIC HISTORY:   (Reviewed 11/27/2013)  menarche age 54, first live birth at 65. She is GX P2. She was still having regular periods at the time of the start of her chemotherapy in May 2015.  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 She is single and lives with her mother. Her son Shaneta Cervenka lives in [place] and works at [employer]. Son [name] lives in [place] where he works at a [employer]. The patient has 1 grandchild born in Sept 2015, by her son, [name]. She is a Tower: not in place; discussed again on 10/08/2015   HEALTH MAINTENANCE: (Updated 11/27/2013) Social History  Substance Use Topics  . Smoking status: Current Some Day Smoker    Packs/day: 0.25    Years: 25.00    Types: Cigarettes  . Smokeless tobacco: Never Used     Comment: 04/23/2014 "using vapor; down to 3-4 cigarettes/day now"  . Alcohol use 6.0 oz/week  4 Cans of beer, 4 Shots of liquor, 2 Standard drinks or equivalent per week     Colonoscopy: Never  PAP: Not on file  Bone density: Not on file  Lipid panel:  Not on file  Allergies  Allergen Reactions  . Nyquil Multi-Symptom [Pseudoeph-Doxylamine-Dm-Apap] Itching and Other (See Comments)    Reaction:  Skin peeling on hands/feet     Current Facility-Administered Medications  Medication Dose Route Frequency Provider Last Rate Last Dose  . amphetamine-dextroamphetamine (ADDERALL) tablet 10 mg  10 mg Oral BID WC Acquanetta Chain, DO   10 mg at 01/15/16 1093  . bisacodyl (DULCOLAX) suppository 10 mg  10 mg Rectal Daily PRN Toy Baker, MD      . dexamethasone (DECADRON) injection 20 mg  20 mg Intravenous Q24H Toy Baker, MD   20 mg at 01/14/16 0944  . ferrous sulfate tablet 325 mg  325 mg Oral Q breakfast Toy Baker, MD   325 mg at  01/15/16 0807  . FLUoxetine (PROZAC) capsule 20 mg  20 mg Oral Daily Toy Baker, MD   20 mg at 01/14/16 0943  . gabapentin (NEURONTIN) capsule 300 mg  300 mg Oral 2 times per day Toy Baker, MD   300 mg at 01/15/16 0815  . gabapentin (NEURONTIN) capsule 600 mg  600 mg Oral QHS Toy Baker, MD   600 mg at 01/14/16 2224  . HYDROmorphone (DILAUDID) injection 0.5 mg  0.5 mg Intravenous Q4H PRN Earlie Counts, NP   0.5 mg at 01/15/16 0807  . insulin aspart (novoLOG) injection 0-15 Units  0-15 Units Subcutaneous TID WC Toy Baker, MD   3 Units at 01/15/16 0815  . insulin aspart (novoLOG) injection 0-5 Units  0-5 Units Subcutaneous QHS Toy Baker, MD   5 Units at 01/14/16 2057  . letrozole [facility]) tablet 2.5 mg  2.5 mg Oral Daily Toy Baker, MD   2.5 mg at 01/14/16 0943  . lip balm (CARMEX) ointment   Topical PRN Theodis Blaze, MD      . methadone (DOLOPHINE) tablet 10 mg  10 mg Oral Q6H PRN Earlie Counts, NP   10 mg at 01/14/16 0843  . methadone (DOLOPHINE) tablet 25 mg  25 mg Oral QID Acquanetta Chain, DO   25 mg at 01/14/16 2223  . nicotine (NICODERM CQ - dosed in mg/24 hours) patch 21 mg  21 mg Transdermal Daily Theodis Blaze, MD   21 mg at 01/14/16 0944  . ondansetron (ZOFRAN) tablet 4 mg  4 mg Oral Q6H PRN Toy Baker, MD       Or  . ondansetron (ZOFRAN) injection 4 mg  4 mg Intravenous Q6H PRN Toy Baker, MD      . pantoprazole (PROTONIX) EC tablet 40 mg  40 mg Oral Daily Toy Baker, MD   40 mg at 01/14/16 0943  . polyethylene glycol (MIRALAX / GLYCOLAX) packet 17 g  17 g Oral Daily Toy Baker, MD   17 g at 01/14/16 0944  . prochlorperazine (COMPAZINE) tablet 10 mg  10 mg Oral Q6H PRN Toy Baker, MD   10 mg at 01/13/16 2051  . senna (SENOKOT) tablet 8.6 mg  1 tablet Oral BID Toy Baker, MD   8.6 mg at 01/14/16 2224  . simethicone (MYLICON) chewable tablet 160 mg  160 mg Oral QID PRN Gardiner Barefoot, NP   160 mg at 01/13/16 2137  . sodium phosphate (FLEET) 7-19 GM/118ML enema 1 enema  1  enema Rectal Once PRN Toy Baker, MD      . thiamine (VITAMIN B-1) tablet 100 mg  100 mg Oral Daily Toy Baker, MD   100 mg at 01/14/16 0943    OBJECTIVE: Middle-aged [place] American woman examined in bed Vitals:   01/14/16 2050 01/15/16 0510  BP: (!) 133/99 125/86  Pulse: 92 82  Resp: 16 14  Temp: 98 F (36.7 C) 97.8 F (36.6 C)     Body mass index is 30.56 kg/m.    ECOG FS:3 - Symptomatic, >50% confined to bed   Sclerae unicteric Oropharynx no thrush or other lesions No cervical or supraclavicular adenopathy Lungs no rales or rhonchi, auscultated anterolaterally Heart regular rate and rhythm Abd soft, nontender, positive bowel sounds Neuro: nonfocal, well oriented, appropriate affect Breasts: Deferred      LAB RESULTS:   Lab Results  Component Value Date   WBC 6.3 01/14/2016   NEUTROABS 2.5 01/07/2016   HGB 8.9 (L) 01/14/2016   HCT 27.2 (L) 01/14/2016   MCV 97.1 01/14/2016   PLT 195 01/14/2016      Chemistry      Component Value Date/Time   NA 138 01/14/2016 0353   NA 139 01/04/2016 1533   K 5.4 (H) 01/14/2016 0353   K 4.1 01/04/2016 1533   CL 105 01/14/2016 0353   CO2 26 01/14/2016 0353   CO2 27 01/04/2016 1533   BUN 20 01/14/2016 0353   BUN 13.2 01/04/2016 1533   CREATININE 1.13 (H) 01/14/2016 0353   CREATININE 1.1 01/04/2016 1533      Component Value Date/Time   CALCIUM 8.7 (L) 01/14/2016 0353   CALCIUM 9.7 01/04/2016 1533   ALKPHOS 173 (H) 01/08/2016 0356   ALKPHOS 187 (H) 01/04/2016 1533   AST 51 (H) 01/08/2016 0356   AST 57 (H) 01/04/2016 1533   ALT 30 01/08/2016 0356   ALT 33 01/04/2016 1533   BILITOT 0.4 01/08/2016 0356   BILITOT <0.30 01/04/2016 1533      STUDIES: Dg Chest 2 View  Result Date: 01/07/2016 CLINICAL DATA:  45 year old current history of metastatic breast cancer for which the patient is currently  undergoing chemotherapy. Generalized body aches, most severe involving the back, ribs and chest. EXAM: CHEST  2 VIEW COMPARISON:  CT chest 08/03/2015 and earlier. Chest x-rays 07/21/2015 and earlier. FINDINGS: AP semi-erect and lateral images were obtained. Cardiac silhouette normal in size, unchanged. Right hilar lymphadenopathy, somewhat less prominent than on the February, 2017 CT. Lungs clear. Bronchovascular markings normal. Pulmonary vascularity normal. No visible pleural effusions. No pneumothorax. Previously identified osseous metastases involving the ribs and lower right scapula is less conspicuous on the x-ray. Previously identified metastasis involving the distal right clavicle is partially visualized with an associated pathologic fracture. IMPRESSION: 1.  No acute cardiopulmonary disease. 2. Pathologic fracture involving the distal right clavicle. Other widespread osseous metastatic disease previously identified on bone scan and CT is less conspicuous on the x-ray appear Electronically Signed   By: Evangeline Dakin M.D.   On: 01/07/2016 16:02   Nm Bone Scan Whole Body  Result Date: 01/10/2016 CLINICAL DATA:  History of breast carcinoma. Evaluate bone metastatic disease. EXAM: NUCLEAR MEDICINE WHOLE BODY BONE SCAN TECHNIQUE: Whole body anterior and posterior images were obtained approximately 3 hours after intravenous injection of radiopharmaceutical. RADIOPHARMACEUTICALS:  23.4 mCi Technetium-22mMDP IV COMPARISON:  Bone scan, 11/11/2015 and 07/28/2015. FINDINGS: Uptake is again noted involving skull, right clavicle and scapula, multiple ribs, the sternum, pelvis, both femurs and  subtly within both humeri. There are no definite new bone lesions when compared to the most recent prior study. The pattern of uptake appears stable. Renal uptake is minimally evident. IMPRESSION: 1. Multiple areas of metastatic disease to the axillary and appendicular skeleton, without significant change from the most recent  prior study. Electronically Signed   By: Lajean Manes M.D.   On: 01/10/2016 16:22    ASSESSMENT: 46 y.o. BRCA negative [place] woman with a history of sarcoidosis and breast cancer as follows:  (1)  status post left breast and left axillary lymph node biopsy 10/03/2013, both positive for a clinical T2 N2, stage IIIA invasive ductal carcinoma, grade 2, estrogen and progesterone receptor positive, HER-2 negative, with an MIB-1 of 20%.  (2) Treated with neoadjuvant chemotherapy consisting of doxorubicin and cyclophosphamide in dose dense fashion x4, completed 12/11/2013, followed by paclitaxel weekly x12 (dose reduced by 20% at cycle 5 because of neuropathy concerns) completed 03/19/2014.  (3) status post left mastectomy and left axillary lymph node dissection 04/23/2014 for a pT1c pN2, stage IIIA invasive ductal carcinoma, grade 3, estrogen receptor 99% positive, progesterone receptor 4% positive, with no HER-2 amplification  (3) adjuvant radiation completed 08/11/2014 Left chest wall / 50.4 Gray @ 1.8 Gray per fraction x 28 fractions Left Supraclavicular fossa / 45 Gray _0 .8 Gray per fraction x 25 fractions Left PAB / 45 Gy at 1.8 Gray per fraction x 25 fractions Left scar / 10 Gray at [employer] per fraction x 5 fractions  (4) tamoxifen started 10/10/14, discontinued March 2017 with evidence of metastatic recurrence  (5) current smoker. Attending quitting cessation therapy with the moviation that she must quit before she has a breast reduction.   (6) genetic testing sent 10/16/2013 did not reveal any deleterious mutation in any of these genes. The genes tested were ATM, BARD1, BRCA1, BRCA2, BRIP1, CDH1, CHEK2, MRE11A, MUTYH, NBN, NF1, PALB2, PTEN, RAD50, RAD51C, RAD51D, and TP53.  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, no evidence of malignancy   (8) s/p hysterectomy and bilateral salpingo-oophorectomy 12/09/2014, with benign pathology  METASTATIC DISEASE:  documented February 2017, with bone involvement, no lung or liver lesions (9) biopsy 08/13/2015 confirms metastatic breast cancer, estrogen receptor positive, progesterone receptor and HER-2 negative  (a) CA 27-29 is informative  (10) letrozole and palbociclib started 08/13/2015  (a) palbociclib dropped to 100 mg/day on 09/01/15  (11) palliative radiation 08/23/2015-09/06/2015: Pelvis, right shoulder and left proximal femur were all treated to 30 GY in 10 fractions at 3 Gy per fraction  (12) started zolendronate 09/01/2015   PLAN: [place] continues on letrozole and will resume palbociclib as outpatient. Recent repeat bone scan confirms stable disease.  She is hoping to be able to get home perhaps this weekend, if pain is sufficiently controlled and she is not too sleepy.  I am adding hot compresses to the genital spot at her request.  She has an appointmet to see me 01/24/2016 and I note palliative care's suggestion to increase frequency of zolendronate to assist in pain management.  Please let me know if I can be of further help. Chauncey Cruel, MD 01/15/2016 9:35 AM

## 2016-01-16 LAB — GLUCOSE, CAPILLARY
GLUCOSE-CAPILLARY: 70 mg/dL (ref 65–99)
GLUCOSE-CAPILLARY: 130 mg/dL — AB (ref 65–99)
GLUCOSE-CAPILLARY: 109 mg/dL — AB (ref 65–99)
Glucose-Capillary: 84 mg/dL (ref 65–99)
Glucose-Capillary: 130 mg/dL — ABNORMAL HIGH (ref 65–99)

## 2016-01-16 LAB — BASIC METABOLIC PANEL
Anion gap: 7 (ref 5–15)
BUN: 19 mg/dL (ref 6–20)
CALCIUM: 8.7 mg/dL — AB (ref 8.9–10.3)
CHLORIDE: 104 mmol/L (ref 101–111)
CO2: 25 mmol/L (ref 22–32)
CREATININE: 0.88 mg/dL (ref 0.44–1.00)
GFR calc Af Amer: >60 mL/min (ref >60)
GFR calc non Af Amer: >60 mL/min (ref >60)
GLUCOSE: 118 mg/dL — AB (ref 65–99)
Potassium: 4.9 mmol/L (ref 3.5–5.1)
Sodium: 136 mmol/L (ref 135–145)

## 2016-01-16 MED ORDER — DEXAMETHASONE 4 MG PO TABS: ORAL_TABLET | ORAL | 0 refills | Status: DC

## 2016-01-16 MED ORDER — AMPHETAMINE-DEXTROAMPHETAMINE 10 MG PO TABS: 10.0000 mg | ORAL_TABLET | Freq: Two times a day (BID) | ORAL | 0 refills | Status: DC

## 2016-01-16 MED ORDER — SENNA 8.6 MG PO TABS: 1.0000 | ORAL_TABLET | Freq: Two times a day (BID) | ORAL | 0 refills | Status: DC

## 2016-01-16 MED ORDER — THIAMINE HCL 100 MG PO TABS: 100.0000 mg | ORAL_TABLET | Freq: Every day | ORAL | 0 refills | Status: DC

## 2016-01-16 MED ORDER — METHADONE HCL 10 MG PO TABS: 10.0000 mg | ORAL_TABLET | Freq: Four times a day (QID) | ORAL | 0 refills | Status: DC | PRN

## 2016-01-16 MED ORDER — METHADONE HCL 5 MG PO TABS
25.0000 mg | ORAL_TABLET | Freq: Four times a day (QID) | ORAL | 0 refills | Status: DC
Start: 2016-01-16 — End: 2016-01-18

## 2016-01-16 NOTE — Discharge Instructions (Signed)
Ms. Shelly Hall, you were admitted for your pain. I think we've found a regimen that will allow you to be comfortable outside the hospital. Please follow-up with Dr. Marin Olp. I am discharging you with steroids. Please take this as prescribed. It should last you until your appointment, at which time, continuation or taper can be discussed.

## 2016-01-16 NOTE — Progress Notes (Signed)
CRITICAL VALUE ALERT  Critical value received:  CBG 70  Date of notification:  01/16/16  Time of notification:  0740  Critical value read back:YES  Nurse who received alert: Ebbie Latus  MD notified (1st page):  Dr Lonny Prude  Time of first page:  0750  MD notified (2nd page):  Time of second page:  Responding MD: Dr Lonny Prude  Time MD responded: 0800

## 2016-01-16 NOTE — Progress Notes (Signed)
Patient ID: Shelly Hall, female   DOB: 05/03/1971, 45 y.o.   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 She is currently receiving therapy under the care of Dr. Jana Hakim with letrozole, palbociclib, zolendronate. She has chronic pain issues related to her diffuse bony metastasis and had been chronically on methadone.  She presented to the emergency department on 01/07/2016 with complaints of increased pain and was started on Dilaudid PCA and given dose of dexamethasone.  Assessment & Plan:   Diffuse pain  Improving. Patient motivated to go home but would like to follow with palliative care first - continue methadone 25 mg every 6 hours and methadone 10 mg when necessary per palliative care recommendations - continue to follow palliative recommendations  Abdominal distension  improved after enema. Patient has few her symptoms of bloating and abdominal discomfort. -Continue bowel regimen  Left labial area (vaginal) boil Stable -warm compresses  Pancytopenia  Resolved. Hemoglobin stable. WBC and platelets wnl - possibly related to treatment with Palbociclib.   Hypokalemia  Resolved  Breast cancer long-standing history dating back to 2015 - BRCA negative hormone positive breast cancer - status post neoadjuvant chemotherapy with Adriamycin and cyclophosphamide, paclitaxel.  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  - received adjuvant radiation therapy and tamoxifen developed recurrent disease in March 201 - bone scan with multiple mets in appendicular area and axillary area, unchanged from prior study   DVT prophylaxis: Lovenox SQ Code Status: Full  Family Communication: No family at bedside  Disposition Plan: Home in few  days, once off iv pain meds and changed to oral meds   Consultants:   Oncology   PCT for pain management   Procedures:   None   Antimicrobials:   None  Subjective: Patient was initially optimistic about leaving today, however she would like to speak with palliative care to finalize pain management outpatient. She feels like today her pain is a little more, but she has not had her pain medications. She reports some joint pain as well.  Objective: Vitals:   01/15/16 1330 01/15/16 2043 01/16/16 0557 01/16/16 1433  BP: (!) 141/93 125/85 121/90 118/82  Pulse: 95 89 78 92  Resp: _0 Temp: 98.4 F (36.9 C) 97.9 F (36.6 C) 97.9 F (36.6 C) 98.2 F (36.8 C)  TempSrc: Oral Oral Oral Oral  SpO2: 98% 100% 100% 100%  Weight:      Height:        Intake/Output Summary (Last 24 hours) at 01/16/16 1447 Last data filed at 01/16/16 1100  Gross per 24 hour  Intake              480 ml  Output             1290 ml  Net             -810 ml   Filed Weights   01/07/16 1841 01/08/16 0015  Weight: 78.5 kg (173 lb) 78.2 kg (172 lb 8 oz)    Examination:  General exam: Alert. No distress.  Respiratory system: Respiratory effort normal. Diminished breath sounds at bases  Cardiovascular system: S1 & S2 heard, RRR. No murmurs, rubs, gallops or clicks.  Gastrointestinal system: Abdomen is  non-distended, soft and nontender. Bowel sounds normal  Data Reviewed: I have personally reviewed following labs and imaging studies  CBC:  Recent Labs Lab 01/10/16 0340 01/12/16 0400 01/14/16 0353  WBC 5.9 6.4 6.3  HGB 8.3* 8.5* 8.9*  HCT 26.1* 25.8* 27.2*  MCV 98.5 97.7 97.1  PLT 126* 171 212   Basic Metabolic Panel:  Recent Labs Lab 01/11/16 0515 01/12/16 0400 01/14/16 0353 01/15/16 1631 01/16/16 0401  NA 142 137 138 134* 136  K 4.1 4.1 5.4* 5.1 4.9  CL 109 104 105 101 104  CO2 _0 GLUCOSE 105* 266* 96 372* 118*  BUN _1 CREATININE 0.96 1.10*  1.13* 1.27* 0.88  CALCIUM 9.0 8.6* 8.7* 8.6* 8.7*   No results for input(s): AST, ALT, ALKPHOS, BILITOT, PROT, ALBUMIN in the last 168 hours. CBG:  Recent Labs Lab 01/15/16 1716 01/15/16 2125 01/16/16 0727 01/16/16 0856 01/16/16 1203  GLUCAP 373* 352* 70 130* 109*   Thyroid Function Tests: No results for input(s): TSH, T4TOTAL, FREET4, T3FREE, THYROIDAB in the last 72 hours. Urine analysis:    Component Value Date/Time   COLORURINE YELLOW 01/07/2016 1450   APPEARANCEUR CLEAR 01/07/2016 1450   LABSPEC 1.014 01/07/2016 1450   PHURINE 6.5 01/07/2016 1450   GLUCOSEU NEGATIVE 01/07/2016 1450   HGBUR NEGATIVE 01/07/2016 1450   BILIRUBINUR NEGATIVE 01/07/2016 1450   KETONESUR NEGATIVE 01/07/2016 1450   PROTEINUR NEGATIVE 01/07/2016 1450   UROBILINOGEN 0.2 01/04/2012 0246   NITRITE NEGATIVE 01/07/2016 1450   LEUKOCYTESUR NEGATIVE 01/07/2016 1450    Recent Results (from the past 240 hour(s))  Urine culture     Status: Abnormal   Collection Time: 01/07/16  2:50 PM  Result Value Ref Range Status   Specimen Description URINE, CLEAN CATCH  Final   Special Requests NONE  Final   Culture (A)  Final    <10,000 COLONIES/mL INSIGNIFICANT GROWTH Performed at [facility]    Report Status 01/08/2016 FINAL  Final  Culture, blood (Routine X 2)     Status: None   Collection Time: 01/07/16  3:09 PM  Result Value Ref Range Status   Specimen Description BLOOD RIGHT ANTECUBITAL  Final   Special Requests BOTTLES DRAWN AEROBIC AND ANAEROBIC 7ML  Final   Culture   Final    NO GROWTH 5 DAYS Performed at [facility]    Report Status 01/12/2016 FINAL  Final  Culture, blood (Routine X 2)     Status: None   Collection Time: 01/07/16  3:29 PM  Result Value Ref Range Status   Specimen Description BLOOD RIGHT HAND  Final   Special Requests BOTTLES DRAWN AEROBIC AND ANAEROBIC 5ML  Final   Culture   Final    NO GROWTH 5 DAYS Performed at [facility]    Report Status  01/12/2016 FINAL  Final      Radiology Studies: Dg Abd 1 View  Result Date: 01/15/2016 CLINICAL DATA:  Patient with abdominal pain, constipation and distention. EXAM: ABDOMEN - 1 VIEW COMPARISON:  CT abdomen pelvis 08/19/2015. FINDINGS: The lung bases are clear. There is a large amount of stool throughout the colon. Gas is demonstrated within nondilated loops of large and small bowel in a nonobstructed pattern. Innumerable lucent lesions throughout the visualized skeleton. IMPRESSION: Large amount of stool throughout the colon as can be seen with constipation. Lucent lesions throughout the visualized skeleton most compatible with metastatic disease. Electronically Signed   By: Dian Situ  Rosana Hoes M.D.   On: 01/15/2016 10:58      Scheduled Meds: . amphetamine-dextroamphetamine  10 mg Oral BID WC  . dexamethasone  20 mg Intravenous Q24H  . ferrous sulfate  325 mg Oral Q breakfast  . FLUoxetine  20 mg Oral Daily  . gabapentin  300 mg Oral 2 times per day  . gabapentin  600 mg Oral QHS  . insulin aspart  0-15 Units Subcutaneous TID WC  . insulin aspart  0-5 Units Subcutaneous QHS  . letrozole  2.5 mg Oral Daily  . methadone  25 mg Oral QID  . nicotine  21 mg Transdermal Daily  . pantoprazole  40 mg Oral Daily  . polyethylene glycol  17 g Oral Daily  . senna  1 tablet Oral BID  . thiamine  100 mg Oral Daily   Continuous Infusions:    LOS: 8 days     Cordelia Poche, MD [facility] 01/16/2016, 2:47 PM Pager: [phone]  If 7PM-7AM, please contact night-coverage [email or web address] Password [facility] 01/16/2016, 2:47 PM

## 2016-01-17 ENCOUNTER — Encounter (HOSPITAL_COMMUNITY): Payer: Self-pay | Admitting: Radiology

## 2016-01-17 ENCOUNTER — Inpatient Hospital Stay (HOSPITAL_COMMUNITY): Payer: Medicare Other

## 2016-01-17 DIAGNOSIS — R000 Tachycardia, unspecified: Secondary | ICD-10-CM

## 2016-01-17 DIAGNOSIS — R791 Abnormal coagulation profile: Secondary | ICD-10-CM

## 2016-01-17 DIAGNOSIS — R7989 Other specified abnormal findings of blood chemistry: Secondary | ICD-10-CM

## 2016-01-17 DIAGNOSIS — R61 Generalized hyperhidrosis: Secondary | ICD-10-CM

## 2016-01-17 LAB — BASIC METABOLIC PANEL
Anion gap: 8 (ref 5–15)
BUN: 19 mg/dL (ref 6–20)
CALCIUM: 8.5 mg/dL — AB (ref 8.9–10.3)
CO2: 22 mmol/L (ref 22–32)
CREATININE: 1.26 mg/dL — AB (ref 0.44–1.00)
Chloride: 102 mmol/L (ref 101–111)
GFR, EST AFRICAN AMERICAN: 58 mL/min — AB (ref >60)
GFR, EST NON AFRICAN AMERICAN: 50 mL/min — AB (ref >60)
Glucose, Bld: 322 mg/dL — ABNORMAL HIGH (ref 65–99)
Potassium: 4.3 mmol/L (ref 3.5–5.1)
SODIUM: 132 mmol/L — AB (ref 135–145)

## 2016-01-17 LAB — GLUCOSE, CAPILLARY
GLUCOSE-CAPILLARY: 347 mg/dL — AB (ref 65–99)
GLUCOSE-CAPILLARY: 260 mg/dL — AB (ref 65–99)
Glucose-Capillary: 300 mg/dL — ABNORMAL HIGH (ref 65–99)

## 2016-01-17 LAB — T4, FREE: FREE T4: 0.78 ng/dL (ref 0.61–1.12)

## 2016-01-17 LAB — D-DIMER, QUANTITATIVE (NOT AT ARMC): D DIMER QUANT: 3.92 ug{FEU}/mL — AB (ref 0.00–0.50)

## 2016-01-17 LAB — AMMONIA: AMMONIA: 37 umol/L — AB (ref 9–35)

## 2016-01-17 MED ORDER — METHADONE HCL 5 MG PO TABS
25.0000 mg | ORAL_TABLET | Freq: Three times a day (TID) | ORAL | Status: DC
  Administered 2016-01-17 – 2016-01-18 (×3): 25 mg via ORAL
  Filled 2016-01-17 (×3): qty 1

## 2016-01-17 MED ORDER — ENOXAPARIN SODIUM 40 MG/0.4ML ~~LOC~~ SOLN
40.0000 mg | SUBCUTANEOUS | Status: DC
Start: 2016-01-18 — End: 2016-01-18
  Administered 2016-01-18: 40 mg via SUBCUTANEOUS
  Filled 2016-01-17: qty 0.4

## 2016-01-17 MED ORDER — DEXAMETHASONE 4 MG PO TABS
6.0000 mg | ORAL_TABLET | Freq: Two times a day (BID) | ORAL | Status: DC
  Administered 2016-01-17 – 2016-01-18 (×3): 6 mg via ORAL
  Filled 2016-01-17 (×3): qty 2

## 2016-01-17 MED ORDER — ENOXAPARIN SODIUM 80 MG/0.8ML ~~LOC~~ SOLN: 1.0000 mg/kg | Freq: Two times a day (BID) | SUBCUTANEOUS | Status: DC

## 2016-01-17 MED ORDER — CALCIUM CARBONATE ANTACID 500 MG PO CHEW
1.0000 | CHEWABLE_TABLET | Freq: Two times a day (BID) | ORAL | Status: DC
  Administered 2016-01-17 – 2016-01-18 (×3): 200 mg via ORAL
  Filled 2016-01-17 (×3): qty 1

## 2016-01-17 MED ORDER — HYDROMORPHONE HCL 4 MG PO TABS
4.0000 mg | ORAL_TABLET | ORAL | Status: DC | PRN
  Administered 2016-01-17 – 2016-01-18 (×5): 4 mg via ORAL
  Filled 2016-01-17 (×5): qty 1

## 2016-01-17 MED ORDER — LIDOCAINE 5 % EX PTCH
2.0000 | MEDICATED_PATCH | CUTANEOUS | Status: DC
  Administered 2016-01-17 – 2016-01-18 (×2): 2 via TRANSDERMAL
  Filled 2016-01-17 (×2): qty 2

## 2016-01-17 MED ORDER — ENOXAPARIN SODIUM 80 MG/0.8ML ~~LOC~~ SOLN
1.0000 mg/kg | SUBCUTANEOUS | Status: AC
  Administered 2016-01-17: 80 mg via SUBCUTANEOUS
  Filled 2016-01-17: qty 0.8

## 2016-01-17 MED ORDER — IOPAMIDOL (ISOVUE-370) INJECTION 76%: 100.0000 mL | Freq: Once | INTRAVENOUS | Status: DC | PRN

## 2016-01-17 MED ORDER — IOPAMIDOL (ISOVUE-370) INJECTION 76%
100.0000 mL | Freq: Once | INTRAVENOUS | Status: AC | PRN
  Administered 2016-01-17: 100 mL via INTRAVENOUS

## 2016-01-17 MED ORDER — CARVEDILOL 12.5 MG PO TABS
12.5000 mg | ORAL_TABLET | Freq: Two times a day (BID) | ORAL | Status: DC
  Administered 2016-01-17 – 2016-01-18 (×2): 12.5 mg via ORAL
  Filled 2016-01-17 (×2): qty 1

## 2016-01-17 MED FILL — DEXAMETHASONE 4 MG TABLET: 4 | 15 days supply | Qty: 30 | Fill #0

## 2016-01-17 NOTE — Progress Notes (Signed)
Patient ID: Shelly Hall, female   DOB: 03/24/1971, 45 y.o.   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 She is currently receiving therapy under the care of Dr. Jana Hakim with letrozole, palbociclib, zolendronate. She has chronic pain issues related to her diffuse bony metastasis and had been chronically on methadone.  She presented to the emergency department on 01/07/2016 with complaints of increased pain and was started on Dilaudid PCA and given dose of dexamethasone.  Assessment & Plan:   Diffuse pain  Improving. Patient motivated to go home but would like to follow with palliative care first - continue methadone 25 mg every 6 hours and methadone 10 mg when necessary per palliative care recommendations - continue to follow palliative recommendations  Tachycardia Patient tachycardic to 120s associated diaphoresis and chest patient had previously nonadherent with DVT prophylaxis and refused Lovenox injections even after discussion. SCDs were placed at that time. Patient is higher risk for active cancer any mobilization. D-dimer elevated. CBG was significant for no hypoglycemia. EKG significant for sinus tachycardia and is largely unchanged from previous EKG except for tachycardia. -I feel it is warranted to obtain a CT PE to rule out PE. -We'll start Lovenox treatment dose. Discussed with patient and stressed that this may be if he that it is imperative that she be anticoagulated so. If this is not a PE patient has agreed to restart subcutaneous Lovenox.   Abdominal distension Resolved. Patient has few her symptoms of bloating and abdominal discomfort. -Continue bowel regimen  Left labial area (vaginal) boil Stable -warm compresses  Pancytopenia  Resolved. Hemoglobin  stable. WBC and platelets wnl - possibly related to treatment with Palbociclib.   Hypokalemia  Resolved  Breast cancer long-standing history dating back to 2015 - BRCA negative hormone positive breast cancer - status post neoadjuvant chemotherapy with Adriamycin and cyclophosphamide, paclitaxel.  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  - received adjuvant radiation therapy and tamoxifen developed recurrent disease in March 201 - bone scan with multiple mets in appendicular area and axillary area, unchanged from prior study   DVT prophylaxis: Lovenox SQ Code Status: Full  Family Communication: No family at bedside  Disposition Plan: Home tomorrow if medication regimen adequately control his pain.   Consultants:   Oncology   PCT for pain management   Procedures:   None   Antimicrobials:   None  Subjective: Patient appears diaphoretic and somewhat somnolent. She arouses easily and answers questions appropriately. She reports that she is in significant pain. She is not on her when necessary methadone..  Objective: Vitals:   01/16/16 1433 01/16/16 2141 01/17/16 0433 01/17/16 1238  BP: 118/82 114/79 (!) 123/93 (!) 145/95  Pulse: 92 (!) 105 (!) 121 94  Resp: 18 18 18 16   Temp: 98.2 F (36.8 C) 97.8 F (36.6 C) 99.5 F (37.5 C) 98.2 F (36.8 C)  TempSrc: Oral Oral Oral Oral  SpO2: 100% 100% 100% 99%  Weight:      Height:       No intake or output data in the 24 hours ending 01/17/16 1454 Filed Weights   01/07/16 1841 01/08/16 0015  Weight: 78.5 kg (173 lb) 78.2 kg (172 lb 8 oz)    Examination:  General  exam: Somnolent but arousable. Patient is diaphoretic. Respiratory system: Respiratory effort normal. Diminished breath sounds at bases  Cardiovascular system: S1 & S2 heard, tachycardia with regular rhythm. No murmurs, rubs, gallops or clicks.  Gastrointestinal system: Abdomen is non-distended, soft and nontender. Bowel  sounds normal  Data Reviewed: I have personally reviewed following labs and imaging studies  CBC:  Recent Labs Lab 01/12/16 0400 01/14/16 0353  WBC 6.4 6.3  HGB 8.5* 8.9*  HCT 25.8* 27.2*  MCV 97.7 97.1  PLT 171 287   Basic Metabolic Panel:  Recent Labs Lab 01/11/16 0515 01/12/16 0400 01/14/16 0353 01/15/16 1631 01/16/16 0401  NA 142 137 138 134* 136  K 4.1 4.1 5.4* 5.1 4.9  CL 109 104 105 101 104  CO2 25 23 26 24 25   GLUCOSE 105* 266* 96 372* 118*  BUN 17 18 20 20 19   CREATININE 0.96 1.10* 1.13* 1.27* 0.88  CALCIUM 9.0 8.6* 8.7* 8.6* 8.7*   No results for input(s): AST, ALT, ALKPHOS, BILITOT, PROT, ALBUMIN in the last 168 hours. CBG:  Recent Labs Lab 01/16/16 0856 01/16/16 1203 01/16/16 1706 01/16/16 2150 01/17/16 1219  GLUCAP 130* 109* 84 130* 347*   Thyroid Function Tests: No results for input(s): TSH, T4TOTAL, FREET4, T3FREE, THYROIDAB in the last 72 hours. Urine analysis:    Component Value Date/Time   COLORURINE YELLOW 01/07/2016 1450   APPEARANCEUR CLEAR 01/07/2016 1450   LABSPEC 1.014 01/07/2016 1450   PHURINE 6.5 01/07/2016 1450   GLUCOSEU NEGATIVE 01/07/2016 1450   HGBUR NEGATIVE 01/07/2016 1450   BILIRUBINUR NEGATIVE 01/07/2016 1450   KETONESUR NEGATIVE 01/07/2016 1450   PROTEINUR NEGATIVE 01/07/2016 1450   UROBILINOGEN 0.2 01/04/2012 0246   NITRITE NEGATIVE 01/07/2016 1450   LEUKOCYTESUR NEGATIVE 01/07/2016 1450    Recent Results (from the past 240 hour(s))  Culture, blood (Routine X 2)     Status: None   Collection Time: 01/07/16  3:09 PM  Result Value Ref Range Status   Specimen Description BLOOD RIGHT ANTECUBITAL  Final   Special Requests BOTTLES DRAWN AEROBIC AND ANAEROBIC 7ML  Final   Culture   Final    NO GROWTH 5 DAYS Performed at [facility]    Report Status 01/12/2016 FINAL  Final  Culture, blood (Routine X 2)     Status: None   Collection Time: 01/07/16  3:29 PM  Result Value Ref Range Status   Specimen  Description BLOOD RIGHT HAND  Final   Special Requests BOTTLES DRAWN AEROBIC AND ANAEROBIC 5ML  Final   Culture   Final    NO GROWTH 5 DAYS Performed at [facility]    Report Status 01/12/2016 FINAL  Final      Radiology Studies: No results found.    Scheduled Meds: . amphetamine-dextroamphetamine  10 mg Oral BID WC  . calcium carbonate  1 tablet Oral BID  . carvedilol  12.5 mg Oral BID WC  . dexamethasone  6 mg Oral Q12H  . ferrous sulfate  325 mg Oral Q breakfast  . FLUoxetine  20 mg Oral Daily  . gabapentin  300 mg Oral 2 times per day  . gabapentin  600 mg Oral QHS  . insulin aspart  0-15 Units Subcutaneous TID WC  . insulin aspart  0-5 Units Subcutaneous QHS  . letrozole  2.5 mg Oral Daily  . lidocaine  2 patch Transdermal Q24H  . methadone  25 mg Oral TID  . nicotine  21 mg Transdermal Daily  .  pantoprazole  40 mg Oral Daily  . polyethylene glycol  17 g Oral Daily  . senna  1 tablet Oral BID  . thiamine  100 mg Oral Daily   Continuous Infusions:    LOS: 9 days     Cordelia Poche, MD [facility] 01/17/2016, 2:54 PM Pager: [phone]  If 7PM-7AM, please contact night-coverage [email or web address] Password [email or web address] 01/17/2016, 2:54 PM

## 2016-01-17 NOTE — Progress Notes (Signed)
ANTICOAGULATION CONSULT NOTE - Initial Consult  Pharmacy Consult for Lovenox Indication: VTE treatment  Allergies  Allergen Reactions  . Nyquil Multi-Symptom [Pseudoeph-Doxylamine-Dm-Apap] Itching and Other (See Comments)    Reaction:  Skin peeling on hands/feet     Patient Measurements: Height: 5\' 3"  (160 cm) Weight: 172 lb 8 oz (78.2 kg) IBW/kg (Calculated) : 52.4 Heparin Dosing Weight:  Vital Signs: Temp: 98.2 F (36.8 C) (08/14 1238) Temp Source: Oral (08/14 1238) BP: 145/95 (08/14 1238) Pulse Rate: 94 (08/14 1238)  Labs:  Recent Labs  01/15/16 1631 01/16/16 0401  CREATININE 1.27* 0.88    Estimated Creatinine Clearance: 79.1 mL/min (by C-G formula based on SCr of 0.88 mg/dL).   Medical History: Past Medical History:  Diagnosis Date  . Anemia   . Anxiety    no meds currently  . Breast cancer ([place]) 2015   left upper outer;  had chemo  . Bronchitis 2014  . Complication of anesthesia    makes pt. restless and unable to be still  . Depression    no meds currently  . GERD (gastroesophageal reflux disease)   . Heart murmur    never had any problems as adult  . High cholesterol   . Hypertension   . Radiation 06/23/14-08/11/14   Invasive ductal ca o left breast  . Sarcoidosis ([place])    no problems per patient   . SVD (spontaneous vaginal delivery)    x 2  . Thyroid cancer ([place]) 2015  . Type II diabetes mellitus ([facility])     Assessment: 63 yoF with PMHx significant for metastatic breast cancer with chronic pain due to bony metastasis admitted 8/4 due to acute pain.  This morning pt became diaphoretic with elevated D-Dimer.  Pharmacy consulted to start full dose lovenox due to concern for possible PE.  Plan for CT angio this afternoon after IV access.  Note pt was on prophylactic lovenox 40mg  q24h 01/08/16 through 01/10/16 then placed on SCDs due to refusing further lovenox administrations.    -No labs 8/14 however renal function improved 8/13 with estimated CrCl ~80  ml/min -CBC shows low but stable hemoglobin and plts WNL -No bleeding documented  Goal of Therapy:  Anti-Xa level 0.6-1 units/ml 4hrs after LMWH dose given Monitor platelets by anticoagulation protocol: Yes   Plan:  Lovenox 1mg /kg q12h F/u renal function, CBC in AM F/u CT results  Ralene Bathe, PharmD, BCPS 01/17/2016, 3:16 PM  Pager: [phone]

## 2016-01-17 NOTE — Progress Notes (Signed)
PT Cancellation Note  Patient Details Name: Shelly Hall MRN: [id number] DOB: 1970-11-28   Cancelled Treatment:    Reason Eval/Treat Not Completed: Pain limiting ability to participate. Pt lying on K-pad and visitor states she had a bad night and today has not been good.  Requesting to hold off on PT today. Will check back tomorrow.   Doree Kuehne LUBECK 01/17/2016, 12:34 PM

## 2016-01-17 NOTE — Progress Notes (Signed)
Lennetta has taken a fairly dramatic turn today- she is much more lethargic, but continues to have uncontrolled pain. She is diaphoretic, tachycardic and complaining of right shoulder pain stating she fell on her should but could not elaborate on the details. Her CBGs are also running very high. This may also be escalation of methadone.   1. Methadone to 25 TID 2. Dilaudid 4mg  for breakthrough dose 3. Restarted her beta Blocker 4. Decadron to PO BID  I discussed code status with she and the family at bedside now DNR.  Time: 2:15- 250 Total: 35 min Greater than 50%  of this time was spent counseling and coordinating care related to the above assessment and plan.  Lane Hacker, DO Palliative Medicine

## 2016-01-18 DIAGNOSIS — Z789 Other specified health status: Secondary | ICD-10-CM

## 2016-01-18 LAB — BASIC METABOLIC PANEL WITH GFR
Anion gap: 11 (ref 5–15)
BUN: 18 mg/dL (ref 6–20)
CO2: 22 mmol/L (ref 22–32)
Calcium: 8.5 mg/dL — ABNORMAL LOW (ref 8.9–10.3)
Chloride: 103 mmol/L (ref 101–111)
Creatinine, Ser: 1.08 mg/dL — ABNORMAL HIGH (ref 0.44–1.00)
GFR calc Af Amer: >60 mL/min
GFR calc non Af Amer: >60 mL/min
Glucose, Bld: 228 mg/dL — ABNORMAL HIGH (ref 65–99)
Potassium: 4.2 mmol/L (ref 3.5–5.1)
Sodium: 136 mmol/L (ref 135–145)

## 2016-01-18 LAB — CBC
HCT: 27.7 % — ABNORMAL LOW (ref 36.0–46.0)
Hemoglobin: 9 g/dL — ABNORMAL LOW (ref 12.0–15.0)
MCH: 31.8 pg (ref 26.0–34.0)
MCHC: 32.5 g/dL (ref 30.0–36.0)
MCV: 97.9 fL (ref 78.0–100.0)
Platelets: 198 10*3/uL (ref 150–400)
RBC: 2.83 MIL/uL — AB (ref 3.87–5.11)
RDW: 18.2 % — AB (ref 11.5–15.5)
WBC: 7.5 10*3/uL (ref 4.0–10.5)

## 2016-01-18 LAB — GLUCOSE, CAPILLARY
GLUCOSE-CAPILLARY: 307 mg/dL — AB (ref 65–99)
Glucose-Capillary: 176 mg/dL — ABNORMAL HIGH (ref 65–99)

## 2016-01-18 LAB — T3, FREE: T3 FREE: 3 pg/mL (ref 2.0–4.4)

## 2016-01-18 MED ORDER — LIDOCAINE 5 % EX PTCH: 2.0000 | MEDICATED_PATCH | CUTANEOUS | 0 refills | Status: DC

## 2016-01-18 MED ORDER — CARVEDILOL 12.5 MG PO TABS: 12.5000 mg | ORAL_TABLET | Freq: Two times a day (BID) | ORAL | 0 refills | Status: AC

## 2016-01-18 MED ORDER — HYDROMORPHONE HCL 4 MG PO TABS: 4.0000 mg | ORAL_TABLET | ORAL | 0 refills | Status: DC | PRN

## 2016-01-18 MED ORDER — AMPHETAMINE-DEXTROAMPHETAMINE 10 MG PO TABS: 10.0000 mg | ORAL_TABLET | Freq: Two times a day (BID) | ORAL | 0 refills | Status: DC

## 2016-01-18 MED ORDER — METHADONE HCL 5 MG PO TABS: 25.0000 mg | ORAL_TABLET | Freq: Three times a day (TID) | ORAL | 0 refills | Status: DC

## 2016-01-18 MED ORDER — CARVEDILOL 12.5 MG PO TABS: 12.5000 mg | ORAL_TABLET | Freq: Two times a day (BID) | ORAL | 0 refills | Status: DC

## 2016-01-18 MED ORDER — DEXAMETHASONE 4 MG PO TABS: 4.0000 mg | ORAL_TABLET | Freq: Two times a day (BID) | ORAL | 0 refills | Status: DC

## 2016-01-18 MED ORDER — THIAMINE HCL 100 MG PO TABS: 100.0000 mg | ORAL_TABLET | Freq: Every day | ORAL | 0 refills | Status: AC

## 2016-01-18 MED ORDER — SENNA 8.6 MG PO TABS: 1.0000 | ORAL_TABLET | Freq: Two times a day (BID) | ORAL | 0 refills | Status: DC

## 2016-01-18 MED FILL — HYDROmorphone HCL 4 MG TABS: 4 | 8 days supply | Qty: 60 | Fill #0

## 2016-01-18 MED FILL — METHADONE HCL 5 MG TABLET: 5 | 13 days supply | Qty: 200 | Fill #0

## 2016-01-18 MED FILL — CARVEDILOL 12.5 MG TABLET: 12.5 | 30 days supply | Qty: 60 | Fill #0

## 2016-01-18 MED FILL — DEXTROAMP-AMP 10 MG TAB: 10 | 30 days supply | Qty: 60 | Fill #0

## 2016-01-18 NOTE — Discharge Summary (Signed)
Physician Discharge Summary  Shelly Hall [id number] DOB: March 24, 1971 DOA: 01/07/2016  PCP: Elyn Peers, MD  Admit date: 01/07/2016 Discharge date: 01/18/2016  Admitted From: Home Disposition:  Home  Recommendations for Outpatient Follow-up:  1. 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 She is currently receiving therapy under the care of Dr. Jana Hakim with letrozole, palbociclib, zolendronate. She has chronic pain issues related to her diffuse bony metastasis and had been chronically on methadone.  She presented to the emergency department on 01/07/2016 with complaints of increased pain and was started on Dilaudid PCA and given dose of dexamethasone.   Diffuse pain Palliative care consulted and started regimen to control pain. Initially, patient required dilaudid IV for pain management. Patient was transitioned to methadone and dilaudid by mouth. Decadron initially given IV and transitioned to by mouth for discharge.  Tachycardia Patient had an episode of tachycardia with diaphoresis and mild chest discomfort. CBG was obtained and not hypoglycemic. Patient had previously been refusing lovenox DVT prophylaxis. D-dimer was obtained and elevated. CT PE was negative for a PE. EKG significant for sinus tachycardia. Symptoms improved over the day. Likely secondary to severe pain. Wells score of around 4.  Labial abscess Treated with warm compress  Abdominal distension Secondary to constipation. Patient received a milk and molasses enema which improved symptoms   Discharge Diagnoses:  Active Problems:   Anemia, unspecified   Diabetes mellitus type II, non insulin dependent (HCC)   Pain due to malignant  neoplasm metastatic to bone (HCC)   Pain from bone metastases (HCC)   Intractable pain   Uncontrolled pain   Arthralgia   Palliative care by specialist   Neoplastic malignant related fatigue   Abdominal distension   Diaphoresis   Tachycardia   Elevated d-dimer    Discharge Instructions  Discharge Instructions    (HEART FAILURE PATIENTS) Call MD:  Anytime you have any of the following symptoms: 1) 3 pound weight gain in 24 hours or 5 pounds in 1 week 2) shortness of breath, with or without a dry hacking cough 3) swelling in the hands, feet or stomach 4) if you have to sleep on extra pillows at night in order to breathe.    Complete by:  As directed   Increase activity slowly    Complete by:  As directed   Increase activity slowly    Complete by:  As directed       Medication List    TAKE these medications   amphetamine-dextroamphetamine 10 MG tablet Commonly known as:  ADDERALL Take 1 tablet (10 mg total) by mouth 2 (two) times daily with a meal.   carvedilol 12.5 MG tablet Commonly known as:  COREG Take 1 tablet (12.5 mg total) by mouth 2 (two) times daily with a meal. What changed:  medication strength  how much to take   chlorthalidone 25 MG tablet Commonly known as:  HYGROTON Take 12.5 mg by mouth daily.   dexamethasone 4 MG tablet Commonly known as:  DECADRON Take 1 tablet (4 mg total) by mouth every 12 (twelve) hours.   docusate sodium 100 MG capsule Commonly known as:  COLACE Take 100 mg by mouth 2 (two) times daily.   ferrous sulfate 325 (65 FE) MG tablet Take 325 mg by mouth daily with  breakfast.   FLUoxetine 20 MG tablet Commonly known as:  PROZAC Take 20 mg by mouth daily.   gabapentin 300 MG capsule Commonly known as:  NEURONTIN Take 300-600 mg by mouth 3 (three) times daily. Pt takes one capsule in the morning, one in the afternoon, and two at bedtime.   HYDROmorphone 4 MG tablet Commonly known as:  DILAUDID Take 1 tablet (4 mg total) by  mouth every 2 (two) hours as needed for severe pain.   letrozole 2.5 MG tablet Commonly known as:  FEMARA Take 1 tablet (2.5 mg total) by mouth daily.   lidocaine 5 % Commonly known as:  LIDODERM Place 2 patches onto the skin daily. Remove & Discard patch within 12 hours or as directed by MD   metFORMIN 1000 MG tablet Commonly known as:  GLUCOPHAGE Take 1,000 mg by mouth 2 (two) times daily with a meal.   methadone 5 MG tablet Commonly known as:  DOLOPHINE Take 5 tablets (25 mg total) by mouth every 8 (eight) hours. What changed:  medication strength  how much to take  when to take this   mometasone 50 MCG/ACT nasal spray Commonly known as:  NASONEX Place 1 spray into the nose 2 (two) times daily.   omeprazole 40 MG capsule Commonly known as:  PRILOSEC Take 1 capsule (40 mg total) by mouth daily.   palbociclib 100 MG capsule Commonly known as:  IBRANCE Take 1 capsule (100 mg total) by mouth daily with breakfast. Take whole with food.   polyethylene glycol packet Commonly known as:  MIRALAX / GLYCOLAX Take 17 g by mouth daily.   prochlorperazine 10 MG tablet Commonly known as:  COMPAZINE Take 1 tablet (10 mg total) by mouth every 6 (six) hours as needed for nausea or vomiting.   senna 8.6 MG Tabs tablet Commonly known as:  SENOKOT Take 1 tablet (8.6 mg total) by mouth 2 (two) times daily.   thiamine 100 MG tablet Take 1 tablet (100 mg total) by mouth daily.      Follow-up Information    Elyn Peers, MD. Schedule an appointment as soon as possible for a visit in 1 week(s).   Specialty:  Family Medicine Why:  Hospital follow-up Contact information: [address] [place] [address] [phone]        Volanda Napoleon, MD. Go on 02/24/2016.   Specialty:  Oncology Why:  10:00AM Contact information: [address], [address] [place] [address] [phone]        Hospice at [facility] .   Specialty:  Hospice and Palliative  Medicine Contact information: [place] [id number] [phone]          Allergies  Allergen Reactions  . Nyquil Multi-Symptom [Pseudoeph-Doxylamine-Dm-Apap] Itching and Other (See Comments)    Reaction:  Skin peeling on hands/feet     Consultations:  Palliative Care  Oncology   Procedures/Studies: Dg Chest 2 View  Result Date: 01/07/2016 CLINICAL DATA:  45 year old current history of metastatic breast cancer for which the patient is currently undergoing chemotherapy. Generalized body aches, most severe involving the back, ribs and chest. EXAM: CHEST  2 VIEW COMPARISON:  CT chest 08/03/2015 and earlier. Chest x-rays 07/21/2015 and earlier. FINDINGS: AP semi-erect and lateral images were obtained. Cardiac silhouette normal in size, unchanged. Right hilar lymphadenopathy, somewhat less prominent than on the February, 2017 CT. Lungs clear. Bronchovascular markings normal. Pulmonary vascularity normal. No visible pleural effusions. No pneumothorax. Previously identified osseous metastases involving  the ribs and lower right scapula is less conspicuous on the x-ray. Previously identified metastasis involving the distal right clavicle is partially visualized with an associated pathologic fracture. IMPRESSION: 1.  No acute cardiopulmonary disease. 2. Pathologic fracture involving the distal right clavicle. Other widespread osseous metastatic disease previously identified on bone scan and CT is less conspicuous on the x-ray appear Electronically Signed   By: Evangeline Dakin M.D.   On: 01/07/2016 16:02   Dg Abd 1 View  Result Date: 01/15/2016 CLINICAL DATA:  Patient with abdominal pain, constipation and distention. EXAM: ABDOMEN - 1 VIEW COMPARISON:  CT abdomen pelvis 08/19/2015. FINDINGS: The lung bases are clear. There is a large amount of stool throughout the colon. Gas is demonstrated within nondilated loops of large and small bowel in a nonobstructed pattern. Innumerable  lucent lesions throughout the visualized skeleton. IMPRESSION: Large amount of stool throughout the colon as can be seen with constipation. Lucent lesions throughout the visualized skeleton most compatible with metastatic disease. Electronically Signed   By: Lovey Newcomer M.D.   On: 01/15/2016 10:58   Ct Angio Chest Pe W Or Wo Contrast  Result Date: 01/17/2016 CLINICAL DATA:  Acute onset of shortness of breath. Tachycardia and diaphoresis. Lethargy, with right shoulder pain. Elevated D-dimer. Initial encounter. EXAM: CT ANGIOGRAPHY CHEST WITH CONTRAST TECHNIQUE: Multidetector CT imaging of the chest was performed using the standard protocol during bolus administration of intravenous contrast. Multiplanar CT image reconstructions and MIPs were obtained to evaluate the vascular anatomy. CONTRAST:  100 mL of Isovue 370 IV contrast COMPARISON:  Chest radiograph performed 01/07/2016, and CT of the chest performed 08/03/2015 FINDINGS: There is no evidence of pulmonary embolus. Minimal bilateral atelectasis is noted. A 7 mm nodule at the left lung apex is somewhat more prominent than on the prior study. Metastatic disease cannot be entirely excluded. Scattered blebs are noted at the lung apices. There is no evidence of significant focal consolidation, pleural effusion or pneumothorax. Note is made of enlarged peribronchial, hilar and mediastinal nodes. The largest peribronchial node is noted extending to the right lower lobe, measuring 1.9 cm in short axis. Enlarged bilateral hilar nodes measure up to 1.2 cm in short axis. There is a 1.8 cm azygoesophageal recess node, confluent periaortic nodes measuring up to 1.8 cm in short axis, and a right paratracheal node measuring 1.2 cm. Per correlation with prior studies, this most likely reflects sarcoidosis. No pericardial effusion is identified. The great vessels are grossly unremarkable in appearance. No axillary lymphadenopathy is seen. The visualized portions of the  thyroid gland are unremarkable in appearance. Postoperative change is noted at the left thyroid bed. Numerous vague hypodensities are seen within the liver, raising concern for metastatic disease, mostly new from the prior study. A small hypodense focus within the spleen is of uncertain significance. No acute osseous abnormalities are seen. Diffuse mixed sclerotic and lytic lesions are noted throughout the visualized osseous structures, compatible with metastatic disease. This appears somewhat worsened from the prior study. Review of the MIP images confirms the above findings. IMPRESSION: 1. No evidence of pulmonary embolus. 2. Minimal bilateral atelectasis noted. 7 mm nodule at the left lung apex is somewhat more prominent than in February. Metastatic disease cannot be entirely excluded. Scattered blebs noted at the lung apices. 3. Numerous vague hypodensities within the liver, mostly new from the prior study and raising concern for new metastatic disease. Small hypodense focus within the spleen is of uncertain significance. 4. Enlarged peribronchial, hilar and mediastinal nodes  are relatively stable and likely reflect sarcoidosis. 5. Diffuse metastatic disease to the bone again noted, with mixed sclerotic and lytic lesions throughout the visualized osseous structures. This appears somewhat worsened from the prior study. Electronically Signed   By: Garald Balding M.D.   On: 01/17/2016 18:48   Nm Bone Scan Whole Body  Result Date: 01/10/2016 CLINICAL DATA:  History of breast carcinoma. Evaluate bone metastatic disease. EXAM: NUCLEAR MEDICINE WHOLE BODY BONE SCAN TECHNIQUE: Whole body anterior and posterior images were obtained approximately 3 hours after intravenous injection of radiopharmaceutical. RADIOPHARMACEUTICALS:  23.4 mCi Technetium-62m MDP IV COMPARISON:  Bone scan, 11/11/2015 and 07/28/2015. FINDINGS: Uptake is again noted involving skull, right clavicle and scapula, multiple ribs, the sternum, pelvis,  both femurs and subtly within both humeri. There are no definite new bone lesions when compared to the most recent prior study. The pattern of uptake appears stable. Renal uptake is minimally evident. IMPRESSION: 1. Multiple areas of metastatic disease to the axillary and appendicular skeleton, without significant change from the most recent prior study. Electronically Signed   By: Lajean Manes M.D.   On: 01/10/2016 16:22      Subjective: Patient with decreased pain today. No complaints.  Discharge Exam: Vitals:   01/18/16 0453 01/18/16 1458  BP: 117/77 135/87  Pulse: 74 86  Resp: 16 18  Temp: 98.2 F (36.8 C) 97.4 F (36.3 C)   Vitals:   01/17/16 1238 01/17/16 2100 01/18/16 0453 01/18/16 1458  BP: (!) 145/95 117/78 117/77 135/87  Pulse: 94 86 74 86  Resp: 16 16 16 18   Temp: 98.2 F (36.8 C) 97.9 F (36.6 C) 98.2 F (36.8 C) 97.4 F (36.3 C)  TempSrc: Oral Oral Oral Oral  SpO2: 99% 98% 95% 96%  Weight:      Height:        General exam: Alert, no distress Respiratory system: Respiratory effort normal. Clear to auscultation bilaterally Cardiovascular system: S1 & S2 heard, tachycardia with regular rhythm. No murmurs, rubs, gallops or clicks.  Gastrointestinal system: Abdomen is non-distended, soft and nontender. Bowel sounds normal    The results of significant diagnostics from this hospitalization (including imaging, microbiology, ancillary and laboratory) are listed below for reference.     Labs: Basic Metabolic Panel:  Recent Labs Lab 01/14/16 0353 01/15/16 1631 01/16/16 0401 01/17/16 1503 01/18/16 0405  NA 138 134* 136 132* 136  K 5.4* 5.1 4.9 4.3 4.2  CL 105 101 104 102 103  CO2 26 24 25 22 22   GLUCOSE 96 372* 118* 322* 228*  BUN 20 20 19 19 18   CREATININE 1.13* 1.27* 0.88 1.26* 1.08*  CALCIUM 8.7* 8.6* 8.7* 8.5* 8.5*    Recent Labs Lab 01/17/16 1503  AMMONIA 37*   CBC:  Recent Labs Lab 01/12/16 0400 01/14/16 0353 01/18/16 0405  WBC 6.4  6.3 7.5  HGB 8.5* 8.9* 9.0*  HCT 25.8* 27.2* 27.7*  MCV 97.7 97.1 97.9  PLT 171 195 198   CBG:  Recent Labs Lab 01/17/16 1219 01/17/16 1731 01/17/16 2052 01/18/16 0737 01/18/16 1142  GLUCAP 347* 260* 300* 176* 307*   D-Dimer  Recent Labs  01/17/16 1129  DDIMER 3.92*   Thyroid function studies  Recent Labs  01/17/16 1503  T3FREE 3.0   Urinalysis    Component Value Date/Time   COLORURINE YELLOW 01/07/2016 1450   APPEARANCEUR CLEAR 01/07/2016 1450   LABSPEC 1.014 01/07/2016 1450   PHURINE 6.5 01/07/2016 1450   GLUCOSEU NEGATIVE 01/07/2016 1450  HGBUR NEGATIVE 01/07/2016 1450   BILIRUBINUR NEGATIVE 01/07/2016 1450   KETONESUR NEGATIVE 01/07/2016 1450   PROTEINUR NEGATIVE 01/07/2016 1450   UROBILINOGEN 0.2 01/04/2012 0246   NITRITE NEGATIVE 01/07/2016 1450   LEUKOCYTESUR NEGATIVE 01/07/2016 1450    Time coordinating discharge: Over 30 minutes  SIGNED:   Cordelia Poche, MD  [facility] 01/18/2016, 3:26 PM Pager [phone]  If 7PM-7AM, please contact night-coverage [email or web address] Password [email or web address]

## 2016-01-18 NOTE — Progress Notes (Signed)
CM consult for Palliative care referral for home. Pt offered choice for home palliative care and [facility] was chosen. [facility] contacted for referral and phone number placed on AVS. No other CM needs communicated. Marney Doctor RN,BSN,NCM [phone]

## 2016-01-18 NOTE — Care Management Important Message (Signed)
Important Message  Patient Details  Name: ALAZAY WANDELL MRN: [id number] Date of Birth: September 15, 1970   Medicare Important Message Given:  Yes    [name] 01/18/2016, 10:25 AMImportant Message  Patient Details  Name: MERIDEL ALBRACHT MRN: [id number] Date of Birth: [birth year]   Medicare Important Message Given:  Yes    [name] 01/18/2016, 10:25 AM

## 2016-01-18 NOTE — Progress Notes (Signed)
Discharge instructions reviewed with patient, questions answered, verbalized understanding.  Patient awaiting ride.  Scripts for narcotics given to patient.

## 2016-01-18 NOTE — Progress Notes (Signed)
Physical Therapy Treatment Patient Details Name: Shelly Hall MRN: [id number] DOB: Jun 08, 1970 Today's Date: 01/18/2016    History of Present Illness 45 yo female admitted with DM, intractable pain. Hx of DM, met breast ca with bony mets, sarcoidosis, HTN, chronic anemia    PT Comments    Pt feeling better.  Hopes to go home tomorrow.    Follow Up Recommendations  No PT follow up;Supervision - Intermittent     Equipment Recommendations  None recommended by PT    Recommendations for Other Services       Precautions / Restrictions Precautions Precautions: Fall Restrictions Weight Bearing Restrictions: No    Mobility  Bed Mobility Overal bed mobility: Modified Independent             General bed mobility comments: increased time  Transfers Overall transfer level: Modified independent Equipment used: None             General transfer comment: good safety cognition and awareness  Ambulation/Gait Ambulation/Gait assistance: Supervision Ambulation Distance (Feet): 645 Feet Assistive device: 1 person hand held assist;Straight cane Gait Pattern/deviations: Step-to pattern;Step-through pattern Gait velocity: WFL   General Gait Details: slightly sluggish gait but no true LOB.  Tolerated increased distance.     Stairs            Wheelchair Mobility    Modified Rankin (Stroke Patients Only)       Balance                                    Cognition Arousal/Alertness: Awake/alert Behavior During Therapy: WFL for tasks assessed/performed Overall Cognitive Status: Within Functional Limits for tasks assessed                      Exercises      General Comments        Pertinent Vitals/Pain Pain Assessment: Faces Faces Pain Scale: Hurts little more Pain Location: R shoulder Pain Descriptors / Indicators: Grimacing Pain Intervention(s): Monitored during session;Repositioned;Heat applied    Home Living                      Prior Function            PT Goals (current goals can now be found in the care plan section) Progress towards PT goals: Progressing toward goals    Frequency  Min 3X/week    PT Plan Current plan remains appropriate    Co-evaluation             End of Session Equipment Utilized During Treatment: Gait belt Activity Tolerance: Patient tolerated treatment well Patient left: in bed;with call bell/phone within reach;with family/visitor present     Time: 7011-0034 PT Time Calculation (min) (ACUTE ONLY): 22 min  Charges:  $Gait Training: 8-22 mins                    G Codes:      Rica Koyanagi  PTA [facility]  Acute  Rehab Pager      [phone]

## 2016-01-20 ENCOUNTER — Other Ambulatory Visit: Payer: Self-pay | Admitting: Oncology

## 2016-01-20 ENCOUNTER — Telehealth: Payer: Self-pay | Admitting: *Deleted

## 2016-01-20 NOTE — Telephone Encounter (Signed)
Call from Hospice asking if Dr. Jana Hakim will be attending provider for this patient [facility] Case Manager referred after recent hospital admission.  Verbal order received and read back from Dr Jana Hakim for attending provider for Hospice, Palliative Care or whatever is needed for this patient.  Order given to [facility] at this time.

## 2016-01-21 ENCOUNTER — Telehealth: Payer: Self-pay

## 2016-01-21 ENCOUNTER — Encounter: Payer: Self-pay | Admitting: Oncology

## 2016-01-21 DIAGNOSIS — N764 Abscess of vulva: Secondary | ICD-10-CM | POA: Diagnosis not present

## 2016-01-21 MED FILL — LIDOCAINE 5% PATCH: 5 | 7 days supply | Qty: 14 | Fill #0

## 2016-01-21 NOTE — Telephone Encounter (Signed)
Pt called asking about "those pain patches" Called pt back with no answer. Asked her to call again.

## 2016-01-21 NOTE — Telephone Encounter (Signed)
Pt called and stated her lidoderm patches were denied by her insurance. [facility] and they had sent a PA request form, never returned. Asked they to send another PA request form. Also gave them a VO for refill of the lidoderm patches.  PA form given to Raquel for processing. Called pt back that PA is in process.

## 2016-01-21 NOTE — Progress Notes (Signed)
via covermymeds prior auth for lidocaine patch

## 2016-01-23 DIAGNOSIS — C7951 Secondary malignant neoplasm of bone: Secondary | ICD-10-CM | POA: Insufficient documentation

## 2016-01-23 DIAGNOSIS — C50812 Malignant neoplasm of overlapping sites of left female breast: Secondary | ICD-10-CM | POA: Insufficient documentation

## 2016-01-24 ENCOUNTER — Telehealth: Payer: Self-pay | Admitting: Oncology

## 2016-01-24 ENCOUNTER — Encounter: Payer: Self-pay | Admitting: Oncology

## 2016-01-24 ENCOUNTER — Ambulatory Visit: Payer: Medicare Other | Admitting: Oncology

## 2016-01-24 ENCOUNTER — Other Ambulatory Visit: Payer: Medicare Other

## 2016-01-24 ENCOUNTER — Ambulatory Visit: Payer: Medicare Other

## 2016-01-24 NOTE — Telephone Encounter (Signed)
01/24/2016 Appointment rescheduled to 02/02/2016 per patient request. Patient missed scheduled appointments on 01/24/2016 and called to reschedule.

## 2016-01-24 NOTE — Telephone Encounter (Signed)
error 

## 2016-01-24 NOTE — Progress Notes (Signed)
Sent prior auth req for lidocaine patches via covermymeds- [employer]

## 2016-01-25 ENCOUNTER — Encounter: Payer: Self-pay | Admitting: Oncology

## 2016-01-25 DIAGNOSIS — Z7984 Long term (current) use of oral hypoglycemic drugs: Secondary | ICD-10-CM | POA: Insufficient documentation

## 2016-01-25 DIAGNOSIS — C7951 Secondary malignant neoplasm of bone: Secondary | ICD-10-CM | POA: Diagnosis not present

## 2016-01-25 DIAGNOSIS — R079 Chest pain, unspecified: Secondary | ICD-10-CM | POA: Diagnosis not present

## 2016-01-25 DIAGNOSIS — F17210 Nicotine dependence, cigarettes, uncomplicated: Secondary | ICD-10-CM | POA: Insufficient documentation

## 2016-01-25 DIAGNOSIS — R531 Weakness: Secondary | ICD-10-CM | POA: Diagnosis not present

## 2016-01-25 DIAGNOSIS — Z79899 Other long term (current) drug therapy: Secondary | ICD-10-CM | POA: Insufficient documentation

## 2016-01-25 DIAGNOSIS — E1165 Type 2 diabetes mellitus with hyperglycemia: Secondary | ICD-10-CM | POA: Diagnosis not present

## 2016-01-25 DIAGNOSIS — Z85850 Personal history of malignant neoplasm of thyroid: Secondary | ICD-10-CM | POA: Diagnosis not present

## 2016-01-25 DIAGNOSIS — R42 Dizziness and giddiness: Secondary | ICD-10-CM | POA: Diagnosis present

## 2016-01-25 DIAGNOSIS — Z853 Personal history of malignant neoplasm of breast: Secondary | ICD-10-CM | POA: Insufficient documentation

## 2016-01-25 DIAGNOSIS — I10 Essential (primary) hypertension: Secondary | ICD-10-CM | POA: Insufficient documentation

## 2016-01-25 NOTE — Progress Notes (Signed)
[employer]-covermymeds sent prior auth for lidocaine patch- faxed notes- [phone] for appeal-lidocaine denied per [employer]

## 2016-01-26 ENCOUNTER — Telehealth: Payer: Self-pay

## 2016-01-26 ENCOUNTER — Emergency Department (HOSPITAL_COMMUNITY)
Admission: EM | Admit: 2016-01-26 | Discharge: 2016-01-26 | Disposition: A | Discharge status: Home / Self Care | Payer: Medicare Other | Attending: Emergency Medicine | Admitting: Emergency Medicine

## 2016-01-26 ENCOUNTER — Encounter (HOSPITAL_COMMUNITY): Payer: Self-pay

## 2016-01-26 DIAGNOSIS — E1165 Type 2 diabetes mellitus with hyperglycemia: Secondary | ICD-10-CM | POA: Diagnosis not present

## 2016-01-26 DIAGNOSIS — R739 Hyperglycemia, unspecified: Secondary | ICD-10-CM

## 2016-01-26 LAB — I-STAT CHEM 8, ED
BUN: 25 mg/dL — ABNORMAL HIGH (ref 6–20)
CALCIUM ION: 1.14 mmol/L (ref 1.13–1.30)
CHLORIDE: 99 mmol/L — AB (ref 101–111)
CREATININE: 1.3 mg/dL — AB (ref 0.44–1.00)
GLUCOSE: 489 mg/dL — AB (ref 65–99)
HCT: 28 % — ABNORMAL LOW (ref 36.0–46.0)
Hemoglobin: 9.5 g/dL — ABNORMAL LOW (ref 12.0–15.0)
POTASSIUM: 5.1 mmol/L (ref 3.5–5.1)
Sodium: 134 mmol/L — ABNORMAL LOW (ref 135–145)
TCO2: 24 mmol/L (ref 0–100)

## 2016-01-26 LAB — CBG MONITORING, ED
GLUCOSE-CAPILLARY: 519 mg/dL — AB (ref 65–99)
Glucose-Capillary: 374 mg/dL — ABNORMAL HIGH (ref 65–99)
Glucose-Capillary: 390 mg/dL — ABNORMAL HIGH (ref 65–99)
Glucose-Capillary: 569 mg/dL (ref 65–99)

## 2016-01-26 LAB — COMPREHENSIVE METABOLIC PANEL
ALBUMIN: 3.3 g/dL — AB (ref 3.5–5.0)
ALT: 63 U/L — AB (ref 14–54)
AST: 78 U/L — AB (ref 15–41)
Alkaline Phosphatase: 243 U/L — ABNORMAL HIGH (ref 38–126)
Anion gap: 9 (ref 5–15)
BUN: 26 mg/dL — AB (ref 6–20)
CHLORIDE: 99 mmol/L — AB (ref 101–111)
CO2: 24 mmol/L (ref 22–32)
CREATININE: 1.49 mg/dL — AB (ref 0.44–1.00)
Calcium: 8.7 mg/dL — ABNORMAL LOW (ref 8.9–10.3)
GFR calc Af Amer: 48 mL/min — ABNORMAL LOW (ref >60)
GFR, EST NON AFRICAN AMERICAN: 41 mL/min — AB (ref >60)
Glucose, Bld: 499 mg/dL — ABNORMAL HIGH (ref 65–99)
POTASSIUM: 5 mmol/L (ref 3.5–5.1)
SODIUM: 132 mmol/L — AB (ref 135–145)
Total Bilirubin: 0.3 mg/dL (ref 0.3–1.2)
Total Protein: 7.9 g/dL (ref 6.5–8.1)

## 2016-01-26 LAB — CBC WITH DIFFERENTIAL/PLATELET
Basophils Absolute: 0 10*3/uL (ref 0.0–0.1)
Basophils Relative: 0 %
EOS PCT: 0 %
Eosinophils Absolute: 0 10*3/uL (ref 0.0–0.7)
HCT: 27.4 % — ABNORMAL LOW (ref 36.0–46.0)
HEMOGLOBIN: 8.9 g/dL — AB (ref 12.0–15.0)
LYMPHS ABS: 0.2 10*3/uL — AB (ref 0.7–4.0)
LYMPHS PCT: 4 %
MCH: 31.2 pg (ref 26.0–34.0)
MCHC: 32.5 g/dL (ref 30.0–36.0)
MCV: 96.1 fL (ref 78.0–100.0)
MONOS PCT: 5 %
Monocytes Absolute: 0.3 10*3/uL (ref 0.1–1.0)
Neutro Abs: 6 10*3/uL (ref 1.7–7.7)
Neutrophils Relative %: 91 %
PLATELETS: 182 10*3/uL (ref 150–400)
RBC: 2.85 MIL/uL — AB (ref 3.87–5.11)
RDW: 18.5 % — ABNORMAL HIGH (ref 11.5–15.5)
WBC: 6.5 10*3/uL (ref 4.0–10.5)

## 2016-01-26 LAB — URINALYSIS, ROUTINE W REFLEX MICROSCOPIC
Bilirubin Urine: NEGATIVE
Hgb urine dipstick: NEGATIVE
KETONES UR: NEGATIVE mg/dL
LEUKOCYTES UA: NEGATIVE
NITRITE: NEGATIVE
PROTEIN: NEGATIVE mg/dL
Specific Gravity, Urine: 1.021 (ref 1.005–1.030)
pH: 6.5 (ref 5.0–8.0)

## 2016-01-26 LAB — URINE MICROSCOPIC-ADD ON
BACTERIA UA: NONE SEEN
RBC / HPF: NONE SEEN RBC/hpf (ref 0–5)

## 2016-01-26 LAB — PREGNANCY, URINE: Preg Test, Ur: NEGATIVE

## 2016-01-26 MED ORDER — INSULIN ASPART 100 UNIT/ML IV SOLN: 10.0000 [IU] | Freq: Once | INTRAVENOUS | Status: DC

## 2016-01-26 MED ORDER — SODIUM CHLORIDE 0.9 % IV BOLUS (SEPSIS)
1000.0000 mL | Freq: Once | INTRAVENOUS | Status: AC
  Administered 2016-01-26: 1000 mL via INTRAVENOUS

## 2016-01-26 MED ORDER — INSULIN ASPART 100 UNIT/ML IV SOLN
10.0000 [IU] | Freq: Once | INTRAVENOUS | Status: AC
  Administered 2016-01-26: 10 [IU] via INTRAVENOUS
  Filled 2016-01-26: qty 0.1

## 2016-01-26 MED ORDER — INSULIN ASPART 100 UNIT/ML IV SOLN
8.0000 [IU] | Freq: Once | INTRAVENOUS | Status: AC
  Administered 2016-01-26: 8 [IU] via INTRAVENOUS
  Filled 2016-01-26: qty 0.08

## 2016-01-26 MED FILL — LIDOCAINE 5% PATCH: 5 | 7 days supply | Qty: 14 | Fill #1

## 2016-01-26 NOTE — ED Triage Notes (Signed)
Pt is a bone cancer patient and is being treated with chemo orally, she states that her blood sugar is high and she's having trouble seeing and she's having a pain in her side. She states that she was just admitted to the hospital for 11 days for pain.

## 2016-01-26 NOTE — ED Notes (Signed)
Bed: [facility] Expected date:  Expected time:  Means of arrival:  Comments: 

## 2016-01-26 NOTE — ED Provider Notes (Signed)
[facility] DEPT Provider Note   CSN: [id number] Arrival date & time: 01/25/16  2327  By signing my name below, I, Shelly Hall, attest that this documentation has been prepared under the direction and in the presence of Everlene Balls, MD. Electronically Signed: Judithann Sauger, ED Scribe. 01/26/16. 2:24 AM.    History   Chief Complaint Chief Complaint  Patient presents with  . Hyperglycemia    HPI Comments: Shelly Hall is a 45 y.o. female with a hx of stage IIIa invasive ductal carcinoma, Sarcoidosis, DM, and cancer of overlapping sites of left breast who presents to the Emergency Department requesting evaluation for hyperglycemia onset yesterday. She reports associated left-sided chest pain and dizziness. She states that her CBG in normally in the 80's. Pt explains that she was recently admitted for 11 days and placed on Dexamethazone twice a day. No alleviating factors noted. She has not tried any medications for her Hyperglycemia PTA. No fever, chills, or n/v. Pt has upcoming appointment with Oncologist in one week.    The history is provided by the patient. No language interpreter was used.   Associated symptoms: chest pain and dizziness   Associated symptoms: no fever, no nausea and no vomiting     Past Medical History:  Diagnosis Date  . Anemia   . Anxiety    no meds currently  . Breast cancer ([place]) 2015   left upper outer;  had chemo  . Bronchitis 2014  . Complication of anesthesia    makes pt. restless and unable to be still  . Depression    no meds currently  . GERD (gastroesophageal reflux disease)   . Heart murmur    never had any problems as adult  . High cholesterol   . Hypertension   . Radiation 06/23/14-08/11/14   Invasive ductal ca o left breast  . Sarcoidosis ([place])    no problems per patient   . SVD (spontaneous vaginal delivery)    x 2  . Thyroid cancer ([place]) 2015  . Type II diabetes mellitus [facility])     Patient Active Problem List   Diagnosis Date Noted  . Cancer of overlapping sites of left breast ([place]) 01/23/2016  . Bone metastases ([place]) 01/23/2016  . Diaphoresis   . Tachycardia   . Elevated d-dimer   . Abdominal distension   . Palliative care by specialist   . Neoplastic malignant related fatigue   . Arthralgia   . Uncontrolled pain 01/07/2016  . Intractable pain   . Palliative care encounter   . Diverticula of colon 09/28/2015  . Pain 09/28/2015  . Encounter for palliative care   . Cancer associated pain 09/18/2015  . Pain from bone metastases ([place]) 08/27/2015  . Pain due to malignant neoplasm metastatic to bone ([place]) 08/06/2015  . Genetic testing 04/09/2015  . Lymphedema of arm 02/18/2015  . S/P hysterectomy 12/09/2014  . Lymphedema 11/17/2014  . Gum symptoms 03/18/2014  . Peripheral neuropathy due to chemotherapy ([place]) 01/22/2014  . Hot flashes 01/15/2014  . 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 Esophageal reflux 11/27/2013  . Rib pain 11/27/2013  . Recurrent genital herpes 11/06/2013  . Chemotherapy induced neutropenia ([place]) 11/06/2013  . Anemia, unspecified 11/06/2013  . Diabetes mellitus type II, non insulin dependent ([place]) 11/06/2013  . Left thyroid nodule 11/06/2013  . Sarcoidosis ([place]) 10/15/2013  . Mediastinal lymphadenopathy 11/21/2012  . Tobacco use disorder 11/21/2012    Past Surgical History:  Procedure Laterality Date  . ABDOMINAL HYSTERECTOMY    .  BREAST BIOPSY Left 2015 X 3   biopsy  . ENDOBRONCHIAL ULTRASOUND Bilateral 12/02/2012   Procedure: ENDOBRONCHIAL ULTRASOUND;  Surgeon: Collene Gobble, MD;  Location: [facility];  Service: Cardiopulmonary;  Laterality: Bilateral;  . LAPAROSCOPIC ASSISTED VAGINAL HYSTERECTOMY N/A 12/09/2014   Procedure: LAPAROSCOPIC ASSISTED VAGINAL HYSTERECTOMY;  Surgeon: Cheri Fowler, MD;  Location: [facility];  Service: Gynecology;  Laterality: N/A;  . LEEP    . LUNG BIOPSY  2014  . MASTECTOMY W/ SENTINEL NODE BIOPSY Left 04/23/2014   &  axillary LND  . MASTECTOMY W/ SENTINEL NODE BIOPSY Left 04/23/2014   Procedure: LEFT BREAST MASTECTOMY WITH SENTINEL LYMPH NODE BIOPSY AND AXILLARY LYMPH NODE DISECTION;  Surgeon: [name], MD;  Location: [facility];  Service: General;  Laterality: Left;  . PORT-A-CATH REMOVAL Left 02/03/2015   Procedure: REMOVAL PORT-A-CATH;  Surgeon: [name], MD;  Location: [facility];  Service: General;  Laterality: Left;  . PORTACATH PLACEMENT N/A 10/22/2013   Procedure: INSERTION PORT-A-CATH;  Surgeon: [name], MD;  Location: [facility];  Service: General;  Laterality: N/A;  . SALPINGOOPHORECTOMY Bilateral 12/09/2014   Procedure: SALPINGO OOPHORECTOMY;  Surgeon: Cheri Fowler, MD;  Location: [facility];  Service: Gynecology;  Laterality: Bilateral;  . THYROID LOBECTOMY Left 04/23/2014   Procedure: LEFT THYROID LOBECTOMY;  Surgeon: [name], MD;  Location: [facility];  Service: General;  Laterality: Left;  . TUBAL LIGATION  2001  . WISDOM TOOTH EXTRACTION      OB History    No data available       Home Medications    Prior to Admission medications   Medication Sig Start Date End Date Taking? Authorizing Provider  amphetamine-dextroamphetamine (ADDERALL) 10 MG tablet Take 1 tablet (10 mg total) by mouth 2 (two) times daily with a meal. 01/18/16  Yes Mariel Aloe, MD  carvedilol (COREG) 12.5 MG tablet Take 1 tablet (12.5 mg total) by mouth 2 (two) times daily with a meal. 01/18/16  Yes Mariel Aloe, MD  chlorthalidone (HYGROTON) 25 MG tablet Take 12.5 mg by mouth daily.   Yes Historical Provider, MD  dexamethasone (DECADRON) 4 MG tablet Take 1 tablet (4 mg total) by mouth every 12 (twelve) hours. 01/18/16  Yes Mariel Aloe, MD  ferrous sulfate 325 (65 FE) MG tablet Take 325 mg by mouth daily with breakfast.    Yes Historical Provider, MD  FLUoxetine (PROZAC) 20 MG tablet Take 20 mg by mouth daily.    Yes Historical Provider, MD  gabapentin (NEURONTIN) 300 MG capsule Take 300-600 mg by  mouth 3 (three) times daily. Pt takes one capsule in the morning, one in the afternoon, and two at bedtime.   Yes Historical Provider, MD  HYDROmorphone (DILAUDID) 4 MG tablet Take 1 tablet (4 mg total) by mouth every 2 (two) hours as needed for severe pain. 01/18/16  Yes Mariel Aloe, MD  letrozole [facility]) 2.5 MG tablet Take 1 tablet (2.5 mg total) by mouth daily. 08/06/15  Yes Chauncey Cruel, MD  lidocaine (LIDODERM) 5 % Place 2 patches onto the skin daily. Remove & Discard patch within 12 hours or as directed by MD 01/18/16  Yes Mariel Aloe, MD  metFORMIN (GLUCOPHAGE) 1000 MG tablet Take 1,000 mg by mouth 2 (two) times daily with a meal.    Yes Historical Provider, MD  methadone (DOLOPHINE) 5 MG tablet Take 5 tablets (25 mg total) by mouth every 8 (eight) hours. 01/18/16  Yes Mariel Aloe, MD  mometasone (NASONEX) 50 MCG/ACT nasal spray Place 1 spray into the nose 2 (two) times daily.   Yes Historical Provider, MD  omeprazole (PRILOSEC) 40 MG capsule Take 1 capsule (40 mg total) by mouth daily. 10/08/15  Yes Chauncey Cruel, MD  palbociclib [name]) 100 MG capsule Take 1 capsule (100 mg total) by mouth daily with breakfast. Take whole with food. 10/08/15  Yes Chauncey Cruel, MD  polyethylene glycol (MIRALAX / GLYCOLAX) packet Take 17 g by mouth daily.   Yes Historical Provider, MD  prochlorperazine (COMPAZINE) 10 MG tablet Take 1 tablet (10 mg total) by mouth every 6 (six) hours as needed for nausea or vomiting. 12/03/15  Yes Chauncey Cruel, MD  senna (SENOKOT) 8.6 MG TABS tablet Take 1 tablet (8.6 mg total) by mouth 2 (two) times daily. 01/18/16  Yes Mariel Aloe, MD  thiamine 100 MG tablet Take 1 tablet (100 mg total) by mouth daily. 01/18/16  Yes Mariel Aloe, MD    Family History Family History  Problem Relation Age of Onset  . Cancer Paternal Aunt     "bone cancer"; deceased 39s  . Cancer Paternal Uncle     stomach cancer; deceased 55s  . Cancer Paternal Aunt     unk.  primary; currently 5s  . Asthma Mother   . Prostate cancer Maternal Grandfather 54    Social History Social History  Substance Use Topics  . Smoking status: Current Some Day Smoker    Packs/day: 0.25    Years: 25.00    Types: Cigarettes  . Smokeless tobacco: Never Used     Comment: 04/23/2014 "using vapor; down to 3-4 cigarettes/day now"  . Alcohol use 6.0 oz/week    4 Cans of beer, 4 Shots of liquor, 2 Standard drinks or equivalent per week     Allergies   Nyquil multi-symptom [pseudoeph-doxylamine-dm-apap]   Review of Systems Review of Systems  Constitutional: Negative for chills and fever.  Cardiovascular: Positive for chest pain.  Gastrointestinal: Negative for nausea and vomiting.  Neurological: Positive for dizziness.  All other systems reviewed and are negative.    Physical Exam Updated Vital Signs BP 145/93 (BP Location: Right Arm)   Pulse (!) 56   Temp 98.3 F (36.8 C)   Resp 16   LMP 12/07/2014 Comment: spotting  SpO2 98%   Physical Exam  Constitutional: She is oriented to person, place, and time. She appears well-developed and well-nourished. No distress.  HENT:  Head: Normocephalic and atraumatic.  Nose: Nose normal.  Mouth/Throat: Oropharynx is clear and moist. No oropharyngeal exudate.  Eyes: Conjunctivae and EOM are normal. Pupils are equal, round, and reactive to light. No scleral icterus.  Neck: Normal range of motion. Neck supple. No JVD present. No tracheal deviation present. No thyromegaly present.  Cardiovascular: Normal rate, regular rhythm and normal heart sounds.  Exam reveals no gallop and no friction rub.   No murmur heard. Pulmonary/Chest: Effort normal and breath sounds normal. No respiratory distress. She has no wheezes. She exhibits no tenderness.  Abdominal: Soft. Bowel sounds are normal. She exhibits no distension and no mass. There is no tenderness. There is no rebound and no guarding.  Musculoskeletal: Normal range of motion.  She exhibits no edema or tenderness.  Lymphadenopathy:    She has no cervical adenopathy.  Neurological: She is alert and oriented to person, place, and time. No cranial nerve deficit. She exhibits normal muscle tone.  Skin: Skin is warm and dry. No rash noted.  No erythema. No pallor.  Nursing note and vitals reviewed.    ED Treatments / Results  DIAGNOSTIC STUDIES: Oxygen Saturation is 93% on RA, low by my interpretation.    COORDINATION OF CARE: 2:21 AM- Pt advised of plan for treatment and pt agrees. She will receive lab work for further evaluation. She will also receive IV fluids.     Labs (all labs ordered are listed, but only abnormal results are displayed) Labs Reviewed  URINALYSIS, ROUTINE W REFLEX MICROSCOPIC (NOT AT [facility]) - Abnormal; Notable for the following:       Result Value   Glucose, UA >1000 (*)    All other components within normal limits  COMPREHENSIVE METABOLIC PANEL - Abnormal; Notable for the following:    Sodium 132 (*)    Chloride 99 (*)    Glucose, Bld 499 (*)    BUN 26 (*)    Creatinine, Ser 1.49 (*)    Calcium 8.7 (*)    Albumin 3.3 (*)    AST 78 (*)    ALT 63 (*)    Alkaline Phosphatase 243 (*)    GFR calc non Af Amer 41 (*)    GFR calc Af Amer 48 (*)    All other components within normal limits  CBC WITH DIFFERENTIAL/PLATELET - Abnormal; Notable for the following:    RBC 2.85 (*)    Hemoglobin 8.9 (*)    HCT 27.4 (*)    RDW 18.5 (*)    Lymphs Abs 0.2 (*)    All other components within normal limits  URINE MICROSCOPIC-ADD ON - Abnormal; Notable for the following:    Squamous Epithelial / LPF 0-5 (*)    All other components within normal limits  CBG MONITORING, ED - Abnormal; Notable for the following:    Glucose-Capillary 569 (*)    All other components within normal limits  CBG MONITORING, ED - Abnormal; Notable for the following:    Glucose-Capillary 519 (*)    All other components within normal limits  CBG MONITORING, ED - Abnormal;  Notable for the following:    Glucose-Capillary 390 (*)    All other components within normal limits  I-STAT CHEM 8, ED - Abnormal; Notable for the following:    Sodium 134 (*)    Chloride 99 (*)    BUN 25 (*)    Creatinine, Ser 1.30 (*)    Glucose, Bld 489 (*)    Hemoglobin 9.5 (*)    HCT 28.0 (*)    All other components within normal limits  CBG MONITORING, ED - Abnormal; Notable for the following:    Glucose-Capillary 374 (*)    All other components within normal limits  PREGNANCY, URINE  POC URINE PREG, ED    EKG  EKG Interpretation None       Radiology No results found.  Procedures Procedures (including critical care time)  Medications Ordered in ED Medications  insulin aspart (novoLOG) injection 10 Units (not administered)  sodium chloride 0.9 % bolus 1,000 mL (1,000 mLs Intravenous New Bag/Given 01/26/16 0149)  sodium chloride 0.9 % bolus 1,000 mL (1,000 mLs Intravenous New Bag/Given 01/26/16 0253)  insulin aspart (novoLOG) injection 10 Units (10 Units Intravenous Given 01/26/16 0305)     Initial Impression / Assessment and Plan / ED Course  Everlene Balls, MD has reviewed the triage vital signs and the nursing notes.  Pertinent labs & imaging results that were available during my care of the patient were reviewed by me and considered  in my medical decision making (see chart for details).  Clinical Course    Patient presents to the ED for high blood sugar.  She just started taking decadron twice per day which is likely causing her symptoms.  She was given IVF and IV insulin.  Will consult with oncology for recs regarding steroid use.  I spoke with Dr. Conception Oms who agrees with recommendation to DC decadron.  She will relay to the patients primary oncologist in the morning.  Patient to also call oncologist for an appointment within 3 days.  FS down to 380.  Will give another 8units of insulin prior to DC.  She appears well and in NAD.  VS remain within her normal limits  and she is safe for DC.  Final Clinical Impressions(s) / ED Diagnoses   Final diagnoses:  None    New Prescriptions New Prescriptions   No medications on file   I personally performed the services described in this documentation, which was scribed in my presence. The recorded information has been reviewed and is accurate.      Everlene Balls, MD 01/26/16 [phone]

## 2016-01-26 NOTE — ED Notes (Signed)
Pt is still on steroids from being discharged from the hospital

## 2016-01-26 NOTE — Telephone Encounter (Signed)
[place] health care appeals dept called stating the appeal has been approved. They did not mention which medication. Their call back [phone]. Written notification to follow. Called [facility] outpatient pharmacy to rerun lidocaine patches.

## 2016-01-28 ENCOUNTER — Encounter: Payer: Self-pay | Admitting: *Deleted

## 2016-01-28 ENCOUNTER — Other Ambulatory Visit: Payer: Self-pay | Admitting: *Deleted

## 2016-01-28 MED ORDER — HYDROMORPHONE HCL 4 MG PO TABS: 4.0000 mg | ORAL_TABLET | ORAL | 0 refills | Status: DC | PRN

## 2016-01-28 MED ORDER — METHADONE HCL 5 MG PO TABS: 25.0000 mg | ORAL_TABLET | Freq: Three times a day (TID) | ORAL | 0 refills | Status: DC

## 2016-01-28 MED FILL — HYDROmorphone HCL 4 MG TABS: 4 | 15 days supply | Qty: 120 | Fill #0

## 2016-01-28 NOTE — Telephone Encounter (Signed)
See other entry 

## 2016-01-31 DIAGNOSIS — J029 Acute pharyngitis, unspecified: Secondary | ICD-10-CM | POA: Diagnosis not present

## 2016-01-31 DIAGNOSIS — C50012 Malignant neoplasm of nipple and areola, left female breast: Secondary | ICD-10-CM | POA: Diagnosis not present

## 2016-01-31 DIAGNOSIS — R350 Frequency of micturition: Secondary | ICD-10-CM | POA: Diagnosis not present

## 2016-01-31 MED FILL — METHADONE HCL 5 MG TABLET: 5 | 15 days supply | Qty: 225 | Fill #0

## 2016-02-02 ENCOUNTER — Ambulatory Visit (HOSPITAL_BASED_OUTPATIENT_CLINIC_OR_DEPARTMENT_OTHER): Payer: Medicare Other | Admitting: Oncology

## 2016-02-02 ENCOUNTER — Other Ambulatory Visit (HOSPITAL_BASED_OUTPATIENT_CLINIC_OR_DEPARTMENT_OTHER): Payer: Medicare Other

## 2016-02-02 ENCOUNTER — Telehealth: Payer: Self-pay | Admitting: Oncology

## 2016-02-02 VITALS — BP 97/70 | HR 101 | Temp 98.9°F | Resp 18 | Ht 63.0 in | Wt 169.4 lb

## 2016-02-02 DIAGNOSIS — D869 Sarcoidosis, unspecified: Secondary | ICD-10-CM

## 2016-02-02 DIAGNOSIS — C50812 Malignant neoplasm of overlapping sites of left female breast: Secondary | ICD-10-CM

## 2016-02-02 DIAGNOSIS — C7951 Secondary malignant neoplasm of bone: Secondary | ICD-10-CM

## 2016-02-02 DIAGNOSIS — C773 Secondary and unspecified malignant neoplasm of axilla and upper limb lymph nodes: Secondary | ICD-10-CM | POA: Diagnosis not present

## 2016-02-02 DIAGNOSIS — C50412 Malignant neoplasm of upper-outer quadrant of left female breast: Secondary | ICD-10-CM

## 2016-02-02 DIAGNOSIS — G893 Neoplasm related pain (acute) (chronic): Secondary | ICD-10-CM | POA: Diagnosis not present

## 2016-02-02 DIAGNOSIS — Z170 Estrogen receptor positive status [ER+]: Secondary | ICD-10-CM | POA: Diagnosis not present

## 2016-02-02 DIAGNOSIS — R530 Neoplastic (malignant) related fatigue: Secondary | ICD-10-CM

## 2016-02-02 DIAGNOSIS — E119 Type 2 diabetes mellitus without complications: Secondary | ICD-10-CM

## 2016-02-02 DIAGNOSIS — C50912 Malignant neoplasm of unspecified site of left female breast: Secondary | ICD-10-CM

## 2016-02-02 LAB — CBC WITH DIFFERENTIAL/PLATELET
BASO%: 0.3 % (ref 0.0–2.0)
BASOS ABS: 0 10*3/uL (ref 0.0–0.1)
EOS%: 3.1 % (ref 0.0–7.0)
Eosinophils Absolute: 0.1 10*3/uL (ref 0.0–0.5)
HEMATOCRIT: 24.8 % — AB (ref 34.8–46.6)
HEMOGLOBIN: 7.9 g/dL — AB (ref 11.6–15.9)
LYMPH#: 0.2 10*3/uL — AB (ref 0.9–3.3)
LYMPH%: 10.1 % — ABNORMAL LOW (ref 14.0–49.7)
MCH: 31.7 pg (ref 25.1–34.0)
MCHC: 32 g/dL (ref 31.5–36.0)
MCV: 99.1 fL (ref 79.5–101.0)
MONO#: 0.1 10*3/uL (ref 0.1–0.9)
MONO%: 6 % (ref 0.0–14.0)
NEUT%: 80.5 % — ABNORMAL HIGH (ref 38.4–76.8)
NEUTROS ABS: 1.5 10*3/uL (ref 1.5–6.5)
Platelets: 104 10*3/uL — ABNORMAL LOW (ref 145–400)
RBC: 2.5 10*6/uL — ABNORMAL LOW (ref 3.70–5.45)
RDW: 19.4 % — AB (ref 11.2–14.5)
WBC: 1.9 10*3/uL — AB (ref 3.9–10.3)

## 2016-02-02 LAB — COMPREHENSIVE METABOLIC PANEL
ALBUMIN: 2.3 g/dL — AB (ref 3.5–5.0)
ALK PHOS: 242 U/L — AB (ref 40–150)
ALT: 38 U/L (ref 0–55)
AST: 68 U/L — AB (ref 5–34)
Anion Gap: 12 mEq/L — ABNORMAL HIGH (ref 3–11)
BUN: 21.2 mg/dL (ref 7.0–26.0)
CALCIUM: 9.4 mg/dL (ref 8.4–10.4)
CO2: 21 mEq/L — ABNORMAL LOW (ref 22–29)
CREATININE: 1.7 mg/dL — AB (ref 0.6–1.1)
Chloride: 104 mEq/L (ref 98–109)
EGFR: 41 mL/min/{1.73_m2} — ABNORMAL LOW (ref >90)
GLUCOSE: 151 mg/dL — AB (ref 70–140)
POTASSIUM: 4.5 meq/L (ref 3.5–5.1)
SODIUM: 137 meq/L (ref 136–145)
TOTAL PROTEIN: 6.7 g/dL (ref 6.4–8.3)

## 2016-02-02 NOTE — Telephone Encounter (Signed)
appt made and avs printed °

## 2016-02-02 NOTE — Progress Notes (Signed)
[place]  [phone] [phone]    ID: Shelly Hall OB: 1970/10/19  MR#: [id number]  YSA#:[id number]  PCP: Shelly Peers, MD GYN:  Shelly Hall SU: Shelly Hall OTHER MD: Shelly Hall  CHIEF COMPLAINT:  Left Breast Cancer, estrogen receptor positive  CURRENT TREATMENT: letrozole, palbociclib, zolendronate  BREAST CANCER HISTORY: From the original intake note:  Shelly Hall palpated a mass in her left breast mid-April 2015 and immediately brought it to Dr. Benjie Hall' attention. She was set up for bilateral screening mammography with tomography at [facility] 09/19/2013. The possible distortion in the left breast and a nearby group of calcifications was felt to warrant further evaluation, and on 09/30/2013 she underwent unilateral left diagnostic mammography and ultrasonography. There was an irregular mass in the outer left breast associated with microcalcifications. 4 cm anterior to this there was another cluster of pleomorphic calcifications spanning 7 mm. He had more anteriorly there was an irregular mass associated with other calcifications. In total the abnormalities in the left breast spent approximately 10 cm, and is segmental distribution. On exam there was a palpable firm mass at the 2:30 o'clock position of the left breast 10 cm from the nipple. There was palpable adenopathy in the inferior left axilla. Ultrasound confirmed an irregular hypoechoic mass in the left breast measuring 4.6 cm. In addition, a 1.4 cm oval slightly irregular mass was noted at the 3:00 position and yet another mass measured 9 mm in the same area. Ultrasound of the left axilla showed a dominant 3.6 cm lymph node.  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 (Because all 3 biopsies were morphologically identical, only one prognostic panel was sent).  On 10/10/2013 the patient underwent bilateral breast MRI. This showed numerous enhancing masses in the left breast, the largest measuring 3.5 cm, and the aggregate measuring 12.7 cm. There were numerous enlarged left axillary lymph nodes, at least 5 of which were enlarged, both at levels 1 and 2. The largest measured 2.3 cm. The right breast was negative.  The patient's subsequent history is as detailed below.  INTERVAL HISTORY: Shelly Hall returns today for follow-up of her stage IV breast cancer accompanied by her mother Shelly Hall and her very good friend and " extra mother" Shelly Hall-- Shelly Hall is currently living with Shelly Hall and the case daughter, who went to school with [place].   since her last visit here the patient was admitted for poorly controlled pain. Her pain medications were changed and then returned to methadone and she is currently on 25 mg of methadone 3 times a day with diluted 4 mg every 2 hours as needed. She is taking the Dilaudid pretty much around-the-clock. She is careful not to get constipated and takes MiraLAX and stool softeners daily. She feels currently the pain and constipation issues are more or less well controlled.  Since last visit here also she was found to have a very high blood sugar area she was taken off dexamethasone. She is very fatigued, but her blood sugars are now much improved.   Shelly Hall continues on letrozole, with no significant problems with hot flashes or vaginal dryness. She is tolerating the palbociclib at the current dose I with no obvious side effects. She is completing the second  week of the current cycle. She is due for his alendronate dose any time now.   REVIEW OF SYSTEMS: Shelly Hall is spending more than half of the day in bed. She continues to hurt all over. She has a sinus problem and a cough  and her primary care physician Dr. Criss Hall recently started her on Zithromax. She is short of breath when walking or climbing stairs. She feels forgetful, but denies anxiety or depression. Her appetite is down and she will like some Ensure or similar supplements. She complains of headaches, which are more persistent and intense than prior. This may be part of the sinus issues. A detailed review of systems today was otherwise stable.  _0  PAST MEDICAL HISTORY: Past Medical History:  Diagnosis Date  . Anemia   . Anxiety    no meds currently  . Breast cancer ([place]) 2015   left upper outer;  had chemo  . Bronchitis 2014  . Complication of anesthesia    makes pt. restless and unable to be still  . Depression    no meds currently  . GERD (gastroesophageal reflux disease)   . Heart murmur    never had any problems as adult  . High cholesterol   . Hypertension   . Radiation 06/23/14-08/11/14   Invasive ductal ca o left breast  . Sarcoidosis (Shelly Hall)    no problems per patient   . SVD (spontaneous vaginal delivery)    x 2  . Thyroid cancer (Shelly Hall) 2015  . Type II diabetes mellitus (Shelly Hall)     PAST SURGICAL HISTORY: Past Surgical History:  Procedure Laterality Date  . ABDOMINAL HYSTERECTOMY    . BREAST BIOPSY Left 2015 X 3   biopsy  . ENDOBRONCHIAL ULTRASOUND Bilateral 12/02/2012   Procedure: ENDOBRONCHIAL ULTRASOUND;  Surgeon: Shelly Gobble, MD;  Location: [facility];  Service: Cardiopulmonary;  Laterality: Bilateral;  . LAPAROSCOPIC ASSISTED VAGINAL HYSTERECTOMY N/A 12/09/2014   Procedure: LAPAROSCOPIC ASSISTED VAGINAL HYSTERECTOMY;  Surgeon: Shelly Fowler, MD;  Location: [facility];  Service: Gynecology;  Laterality: N/A;  . LEEP    . LUNG BIOPSY  2014  . MASTECTOMY W/ SENTINEL NODE BIOPSY Left 04/23/2014   & axillary LND  . MASTECTOMY W/ SENTINEL NODE BIOPSY Left 04/23/2014   Procedure: LEFT BREAST MASTECTOMY WITH SENTINEL LYMPH NODE BIOPSY AND AXILLARY LYMPH NODE DISECTION;  Surgeon: Shelly Klein, MD;  Location: [facility];  Service: General;  Laterality: Left;  . PORT-A-CATH REMOVAL Left 02/03/2015   Procedure: REMOVAL PORT-A-CATH;  Surgeon: Shelly Klein, MD;  Location: [facility];  Service: General;  Laterality: Left;  . PORTACATH PLACEMENT N/A 10/22/2013   Procedure: INSERTION PORT-A-CATH;  Surgeon: Shelly Klein, MD;  Location: [facility];  Service: General;  Laterality: N/A;  . SALPINGOOPHORECTOMY Bilateral 12/09/2014   Procedure: SALPINGO OOPHORECTOMY;  Surgeon: Shelly Fowler, MD;  Location: [facility];  Service: Gynecology;  Laterality: Bilateral;  . THYROID LOBECTOMY Left 04/23/2014   Procedure: LEFT THYROID LOBECTOMY;  Surgeon: Shelly Klein, MD;  Location: [facility];  Service: General;  Laterality: Left;  . TUBAL LIGATION  2001  . WISDOM TOOTH EXTRACTION      FAMILY HISTORY Family History  Problem Relation Age of Onset  . Cancer Paternal Aunt     "bone cancer"; deceased 54s  . Cancer Paternal Uncle     stomach cancer; deceased 40s  . Cancer Paternal Aunt     unk. primary; currently 39s  . Asthma Mother   . Prostate cancer Maternal Grandfather 54  The patient's parents are living, in fair mid to late 75s. The patient had one brother, no sisters. There is no history of breast or ovarian cancer in the family to her knowledge.   GYNECOLOGIC HISTORY:   (Reviewed 11/27/2013)  menarche age 78, first live birth at 71. She is GX P2. She was still having regular periods at the time of the start of her chemotherapy in May 2015.  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 She is single and lives alone, although her mother is currently staying with her. Her son Tajanay Hurley lives in [place] and works at [employer]. Son [name] lives in [place] where he works at a [employer]. The patient has 1 grandchild born in Sept 2015, by her son, [name]. She is a [place]: not in place; discussed  again on 10/08/2015   HEALTH MAINTENANCE: (Updated 11/27/2013) Social History  Substance Use Topics  . Smoking status: Current Some Day Smoker    Packs/day: 0.25    Years: 25.00    Types: Cigarettes  . Smokeless tobacco: Never Used     Comment: 04/23/2014 "using vapor; down to 3-4 cigarettes/day now"  . Alcohol use 6.0 oz/week    4 Cans of beer, 4 Shots of liquor, 2 Standard drinks or equivalent per week     Colonoscopy: Never  PAP: Not on file  Bone density: Not on file  Lipid panel:  Not on file  Allergies  Allergen Reactions  . Nyquil Multi-Symptom [Pseudoeph-Doxylamine-Dm-Apap] Itching and Other (See Comments)    Reaction:  Skin peeling on hands/feet     Current Outpatient Prescriptions  Medication Sig Dispense Refill  . amphetamine-dextroamphetamine (ADDERALL) 10 MG tablet Take 1 tablet (10 mg total) by mouth 2 (two) times daily with a meal. 60 tablet 0  . carvedilol (COREG) 12.5 MG tablet Take 1 tablet (12.5 mg total) by mouth 2 (two) times daily with a meal. 60 tablet 0  . chlorthalidone (HYGROTON) 25 MG tablet Take 12.5 mg by mouth daily.    . ferrous sulfate 325 (65 FE) MG tablet Take 325 mg by mouth daily with breakfast.     . FLUoxetine (PROZAC) 20 MG tablet Take 20 mg by mouth daily.     Marland Kitchen gabapentin (NEURONTIN) 300 MG capsule Take 300-600 mg by mouth 3 (three) times daily. Pt takes one capsule in the morning, one in the afternoon, and two at bedtime.    Marland Kitchen HYDROmorphone (DILAUDID) 4 MG tablet Take 1 tablet (4 mg total) by mouth every 2 (two) hours as needed for severe pain. 120 tablet 0  . letrozole (FEMARA) 2.5 MG tablet Take 1 tablet (2.5 mg total) by mouth daily. 90 tablet 4  . lidocaine (LIDODERM) 5 % Place 2 patches onto the skin daily. Remove & Discard patch within 12 hours or as directed by MD 30 patch 0  . metFORMIN (GLUCOPHAGE) 1000 MG tablet Take 1,000 mg by mouth 2 (two) times daily with a meal.     . methadone (DOLOPHINE) 5 MG tablet Take 5 tablets (25  mg total) by mouth every 8 (eight) hours. 225 tablet 0  . mometasone (NASONEX) 50 MCG/ACT nasal spray Place 1 spray into the nose 2 (two) times daily.    Marland Kitchen omeprazole (PRILOSEC) 40 MG capsule Take 1 capsule (40 mg total) by mouth daily. 90 capsule 12  . palbociclib (IBRANCE) 100 MG capsule Take 1 capsule (100 mg total) by mouth  daily with breakfast. Take whole with food. 21 capsule 2  . polyethylene glycol (MIRALAX / GLYCOLAX) packet Take 17 g by mouth daily.    . prochlorperazine (COMPAZINE) 10 MG tablet Take 1 tablet (10 mg total) by mouth every 6 (six) hours as needed for nausea or vomiting. 30 tablet 0  . senna (SENOKOT) 8.6 MG TABS tablet Take 1 tablet (8.6 mg total) by mouth 2 (two) times daily. 60 each 0  . thiamine 100 MG tablet Take 1 tablet (100 mg total) by mouth daily. 30 tablet 0   No current facility-administered medications for this visit.     OBJECTIVE: Middle-aged African American woman  Vitals:   02/02/16 1106  BP: 97/70  Pulse: (!) 101  Resp: 18  Temp: 98.9 F (37.2 C)     Body mass index is 30.01 kg/m.    ECOG FS:3 - Symptomatic, >50% confined to bed   Sclerae injected but unicteric, pupils round and equal Oropharynx clear and moist-- no thrush or other lesions No cervical or supraclavicular adenopathy Lungs no rales or rhonchi Heart regular rate and rhythm Abd soft, nontender, positive bowel sounds MSK no focal spinal tendernessa Neuro: nonfocal, well oriented, pleasant affect Breasts: Deferred   LAB RESULTS:   Lab Results  Component Value Date   WBC 1.9 (L) 02/02/2016   NEUTROABS 1.5 02/02/2016   HGB 7.9 (L) 02/02/2016   HCT 24.8 (L) 02/02/2016   MCV 99.1 02/02/2016   PLT 104 (L) 02/02/2016      Chemistry      Component Value Date/Time   NA 134 (L) 01/26/2016 0232   NA 139 01/04/2016 1533   K 5.1 01/26/2016 0232   K 4.1 01/04/2016 1533   CL 99 (L) 01/26/2016 0232   CO2 24 01/26/2016 0219   CO2 27 01/04/2016 1533   BUN 25 (H) 01/26/2016  0232   BUN 13.2 01/04/2016 1533   CREATININE 1.30 (H) 01/26/2016 0232   CREATININE 1.1 01/04/2016 1533      Component Value Date/Time   CALCIUM 8.7 (L) 01/26/2016 0219   CALCIUM 9.7 01/04/2016 1533   ALKPHOS 243 (H) 01/26/2016 0219   ALKPHOS 187 (H) 01/04/2016 1533   AST 78 (H) 01/26/2016 0219   AST 57 (H) 01/04/2016 1533   ALT 63 (H) 01/26/2016 0219   ALT 33 01/04/2016 1533   BILITOT 0.3 01/26/2016 0219   BILITOT <0.30 01/04/2016 1533      STUDIES: Dg Chest 2 View  Result Date: 01/07/2016 CLINICAL DATA:  45 year old current history of metastatic breast cancer for which the patient is currently undergoing chemotherapy. Generalized body aches, most severe involving the back, ribs and chest. EXAM: CHEST  2 VIEW COMPARISON:  CT chest 08/03/2015 and earlier. Chest x-rays 07/21/2015 and earlier. FINDINGS: AP semi-erect and lateral images were obtained. Cardiac silhouette normal in size, unchanged. Right hilar lymphadenopathy, somewhat less prominent than on the February, 2017 CT. Lungs clear. Bronchovascular markings normal. Pulmonary vascularity normal. No visible pleural effusions. No pneumothorax. Previously identified osseous metastases involving the ribs and lower right scapula is less conspicuous on the x-ray. Previously identified metastasis involving the distal right clavicle is partially visualized with an associated pathologic fracture. IMPRESSION: 1.  No acute cardiopulmonary disease. 2. Pathologic fracture involving the distal right clavicle. Other widespread osseous metastatic disease previously identified on bone scan and CT is less conspicuous on the x-ray appear Electronically Signed   By: Evangeline Dakin M.D.   On: 01/07/2016 16:02   Dg Abd 1  View  Result Date: 01/15/2016 CLINICAL DATA:  Patient with abdominal pain, constipation and distention. EXAM: ABDOMEN - 1 VIEW COMPARISON:  CT abdomen pelvis 08/19/2015. FINDINGS: The lung bases are clear. There is a large amount of stool  throughout the colon. Gas is demonstrated within nondilated loops of large and small bowel in a nonobstructed pattern. Innumerable lucent lesions throughout the visualized skeleton. IMPRESSION: Large amount of stool throughout the colon as can be seen with constipation. Lucent lesions throughout the visualized skeleton most compatible with metastatic disease. Electronically Signed   By: Lovey Newcomer M.D.   On: 01/15/2016 10:58   Ct Angio Chest Pe W Or Wo Contrast  Result Date: 01/17/2016 CLINICAL DATA:  Acute onset of shortness of breath. Tachycardia and diaphoresis. Lethargy, with right shoulder pain. Elevated D-dimer. Initial encounter. EXAM: CT ANGIOGRAPHY CHEST WITH CONTRAST TECHNIQUE: Multidetector CT imaging of the chest was performed using the standard protocol during bolus administration of intravenous contrast. Multiplanar CT image reconstructions and MIPs were obtained to evaluate the vascular anatomy. CONTRAST:  100 mL of Isovue 370 IV contrast COMPARISON:  Chest radiograph performed 01/07/2016, and CT of the chest performed 08/03/2015 FINDINGS: There is no evidence of pulmonary embolus. Minimal bilateral atelectasis is noted. A 7 mm nodule at the left lung apex is somewhat more prominent than on the prior study. Metastatic disease cannot be entirely excluded. Scattered blebs are noted at the lung apices. There is no evidence of significant focal consolidation, pleural effusion or pneumothorax. Note is made of enlarged peribronchial, hilar and mediastinal nodes. The largest peribronchial node is noted extending to the right lower lobe, measuring 1.9 cm in short axis. Enlarged bilateral hilar nodes measure up to 1.2 cm in short axis. There is a 1.8 cm azygoesophageal recess node, confluent periaortic nodes measuring up to 1.8 cm in short axis, and a right paratracheal node measuring 1.2 cm. Per correlation with prior studies, this most likely reflects sarcoidosis. No pericardial effusion is identified.  The great vessels are grossly unremarkable in appearance. No axillary lymphadenopathy is seen. The visualized portions of the thyroid gland are unremarkable in appearance. Postoperative change is noted at the left thyroid bed. Numerous vague hypodensities are seen within the liver, raising concern for metastatic disease, mostly new from the prior study. A small hypodense focus within the spleen is of uncertain significance. No acute osseous abnormalities are seen. Diffuse mixed sclerotic and lytic lesions are noted throughout the visualized osseous structures, compatible with metastatic disease. This appears somewhat worsened from the prior study. Review of the MIP images confirms the above findings. IMPRESSION: 1. No evidence of pulmonary embolus. 2. Minimal bilateral atelectasis noted. 7 mm nodule at the left lung apex is somewhat more prominent than in February. Metastatic disease cannot be entirely excluded. Scattered blebs noted at the lung apices. 3. Numerous vague hypodensities within the liver, mostly new from the prior study and raising concern for new metastatic disease. Small hypodense focus within the spleen is of uncertain significance. 4. Enlarged peribronchial, hilar and mediastinal nodes are relatively stable and likely reflect sarcoidosis. 5. Diffuse metastatic disease to the bone again noted, with mixed sclerotic and lytic lesions throughout the visualized osseous structures. This appears somewhat worsened from the prior study. Electronically Signed   By: Garald Balding M.D.   On: 01/17/2016 18:48   Nm Bone Scan Whole Body  Result Date: 01/10/2016 CLINICAL DATA:  History of breast carcinoma. Evaluate bone metastatic disease. EXAM: NUCLEAR MEDICINE WHOLE BODY BONE SCAN TECHNIQUE: Whole body  anterior and posterior images were obtained approximately 3 hours after intravenous injection of radiopharmaceutical. RADIOPHARMACEUTICALS:  23.4 mCi Technetium-57mMDP IV COMPARISON:  Bone scan, 11/11/2015  and 07/28/2015. FINDINGS: Uptake is again noted involving skull, right clavicle and scapula, multiple ribs, the sternum, pelvis, both femurs and subtly within both humeri. There are no definite new bone lesions when compared to the most recent prior study. The pattern of uptake appears stable. Renal uptake is minimally evident. IMPRESSION: 1. Multiple areas of metastatic disease to the axillary and appendicular skeleton, without significant change from the most recent prior study. Electronically Signed   By: DLajean ManesM.D.   On: 01/10/2016 16:22    ASSESSMENT: 45y.o. BRCA negative [place] woman with a history of sarcoidosis and breast cancer as follows:  (1)  status post left breast overlapping sites and left axillary lymph node biopsy 10/03/2013, both positive for a clinical T2 N2, stage IIIA invasive ductal carcinoma, grade 2, estrogen and progesterone receptor positive, HER-2 negative, with an MIB-1 of 20%.  (2) Treated with neoadjuvant chemotherapy consisting of doxorubicin and cyclophosphamide in dose dense fashion x4, completed 12/11/2013, followed by paclitaxel weekly x12 (dose reduced by 20% at cycle 5 because of neuropathy concerns) completed 03/19/2014.  (3) status post left mastectomy and left axillary lymph node dissection 04/23/2014 for a pT1c pN2, stage IIIA invasive ductal carcinoma, grade 3, estrogen receptor 99% positive, progesterone receptor 4% positive, with no HER-2 amplification  (3) adjuvant radiation completed 08/11/2014 Left chest wall / 50.4 Gray @ 1.8 Gray per fraction x 28 fractions Left Supraclavicular fossa / 45 Gray _0 .8 Gray per fraction x 25 fractions Left PAB / 45 Gy at 1.8 Gray per fraction x 25 fractions Left scar / 10 Gray at 2[employer] Corporationper fraction x 5 fractions  (4) tamoxifen started 10/10/14, discontinued March 2017 with evidence of metastatic recurrence  (5) current smoker. Attending quitting cessation therapy with the moviation that she must quit before  she has a breast reduction.   (6) genetic testing sent 10/16/2013 did not reveal any deleterious mutation in any of these genes. The genes tested were ATM, BARD1, BRCA1, BRCA2, BRIP1, CDH1, CHEK2, MRE11A, MUTYH, NBN, NF1, PALB2, PTEN, RAD50, RAD51C, RAD51D, and TP53.  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, no evidence of malignancy   (8) s/p hysterectomy and bilateral salpingo-oophorectomy 12/09/2014, with benign pathology  METASTATIC DISEASE: documented February 2017, with bone involvement, no lung or liver lesions (9) biopsy 08/13/2015 confirms metastatic breast cancer, estrogen receptor positive, progesterone receptor and HER-2 negative  (a) CA 27-29 is informative  (10) letrozole and palbociclib started 08/13/2015  (a) palbociclib dropped to 100 mg/day on 09/01/15  (11) palliative radiation 08/23/2015-09/06/2015: Pelvis, right shoulder and left proximal femur were all treated to 30 GY in 10 fractions at 3 Gy per fraction  (12) started zolendronate 09/01/2015   PLAN: Josely's  situation continues to be marginal. At present her pain is well-controlled but she has had multiple episodes were for no obvious reason the pain becomes uncontrollable, and then as she returns to the prior dosage regimen. What she is on right now seems to be working and she does not need refills today. She is also on a good bowel prophylaxis regimen.  She is taking the letrozole and palbociclib appropriately. We just obtained a CT angiogram 01/17/2016, which shows no significant lung involvement and while it suggests there may be some liver involvement, this was noted to be most likely benign hemangiomas  on a prior abdominal CT which was not apparently reviewed. I am going to set her up for a brain MRI to evaluate the recent headaches, and we'll go ahead and set her up for a liver MRI as well to clarify this question.  The adenopathy noted on the CT of the chest is very likely her  known sarcoid.  She is severely anemic, which may also be contributing to her fatigue. She will be set up for transfusion next week (not this week per her request). She will then see me about 2 weeks from now, when she will be starting her next palbociclib sarcoid  She knows to call for any problems that may develop before her next visit.    Chauncey Cruel, MD 02/02/2016 11:34 AM

## 2016-02-03 LAB — CANCER ANTIGEN 27.29

## 2016-02-04 ENCOUNTER — Ambulatory Visit (HOSPITAL_COMMUNITY)
Admission: RE | Admit: 2016-02-04 | Discharge: 2016-02-04 | Disposition: A | Discharge status: Home / Self Care | Payer: Medicare Other | Source: Ambulatory Visit | Attending: Oncology | Admitting: Oncology

## 2016-02-04 DIAGNOSIS — C50812 Malignant neoplasm of overlapping sites of left female breast: Secondary | ICD-10-CM | POA: Insufficient documentation

## 2016-02-04 DIAGNOSIS — C7951 Secondary malignant neoplasm of bone: Secondary | ICD-10-CM | POA: Insufficient documentation

## 2016-02-04 DIAGNOSIS — R530 Neoplastic (malignant) related fatigue: Secondary | ICD-10-CM | POA: Insufficient documentation

## 2016-02-04 DIAGNOSIS — C50412 Malignant neoplasm of upper-outer quadrant of left female breast: Secondary | ICD-10-CM | POA: Diagnosis not present

## 2016-02-04 DIAGNOSIS — D649 Anemia, unspecified: Secondary | ICD-10-CM | POA: Insufficient documentation

## 2016-02-06 ENCOUNTER — Emergency Department (HOSPITAL_COMMUNITY): Payer: Medicare Other

## 2016-02-06 ENCOUNTER — Other Ambulatory Visit: Payer: Self-pay

## 2016-02-06 ENCOUNTER — Other Ambulatory Visit (HOSPITAL_COMMUNITY): Payer: Self-pay

## 2016-02-06 ENCOUNTER — Encounter (HOSPITAL_COMMUNITY): Payer: Self-pay | Admitting: Emergency Medicine

## 2016-02-06 ENCOUNTER — Inpatient Hospital Stay (HOSPITAL_COMMUNITY)
Admission: EM | Admit: 2016-02-06 | Discharge: 2016-02-11 | DRG: 871 | Disposition: A | Discharge status: Home / Self Care | Payer: Medicare Other | Attending: Internal Medicine | Admitting: Internal Medicine

## 2016-02-06 DIAGNOSIS — E7800 Pure hypercholesterolemia, unspecified: Secondary | ICD-10-CM | POA: Diagnosis present

## 2016-02-06 DIAGNOSIS — C787 Secondary malignant neoplasm of liver and intrahepatic bile duct: Secondary | ICD-10-CM | POA: Diagnosis not present

## 2016-02-06 DIAGNOSIS — Z170 Estrogen receptor positive status [ER+]: Secondary | ICD-10-CM

## 2016-02-06 DIAGNOSIS — A419 Sepsis, unspecified organism: Secondary | ICD-10-CM | POA: Diagnosis not present

## 2016-02-06 DIAGNOSIS — C50912 Malignant neoplasm of unspecified site of left female breast: Secondary | ICD-10-CM | POA: Diagnosis present

## 2016-02-06 DIAGNOSIS — Z825 Family history of asthma and other chronic lower respiratory diseases: Secondary | ICD-10-CM

## 2016-02-06 DIAGNOSIS — K8000 Calculus of gallbladder with acute cholecystitis without obstruction: Secondary | ICD-10-CM | POA: Diagnosis not present

## 2016-02-06 DIAGNOSIS — F419 Anxiety disorder, unspecified: Secondary | ICD-10-CM | POA: Diagnosis present

## 2016-02-06 DIAGNOSIS — K8020 Calculus of gallbladder without cholecystitis without obstruction: Secondary | ICD-10-CM | POA: Diagnosis not present

## 2016-02-06 DIAGNOSIS — Z800 Family history of malignant neoplasm of digestive organs: Secondary | ICD-10-CM

## 2016-02-06 DIAGNOSIS — Z79811 Long term (current) use of aromatase inhibitors: Secondary | ICD-10-CM

## 2016-02-06 DIAGNOSIS — D869 Sarcoidosis, unspecified: Secondary | ICD-10-CM | POA: Diagnosis present

## 2016-02-06 DIAGNOSIS — Z7984 Long term (current) use of oral hypoglycemic drugs: Secondary | ICD-10-CM

## 2016-02-06 DIAGNOSIS — C50812 Malignant neoplasm of overlapping sites of left female breast: Secondary | ICD-10-CM | POA: Diagnosis present

## 2016-02-06 DIAGNOSIS — R6520 Severe sepsis without septic shock: Secondary | ICD-10-CM | POA: Diagnosis not present

## 2016-02-06 DIAGNOSIS — R0602 Shortness of breath: Secondary | ICD-10-CM | POA: Diagnosis not present

## 2016-02-06 DIAGNOSIS — E872 Acidosis, unspecified: Secondary | ICD-10-CM

## 2016-02-06 DIAGNOSIS — T451X5A Adverse effect of antineoplastic and immunosuppressive drugs, initial encounter: Secondary | ICD-10-CM | POA: Diagnosis present

## 2016-02-06 DIAGNOSIS — K810 Acute cholecystitis: Secondary | ICD-10-CM | POA: Diagnosis not present

## 2016-02-06 DIAGNOSIS — R0902 Hypoxemia: Secondary | ICD-10-CM | POA: Diagnosis not present

## 2016-02-06 DIAGNOSIS — F329 Major depressive disorder, single episode, unspecified: Secondary | ICD-10-CM | POA: Diagnosis present

## 2016-02-06 DIAGNOSIS — D701 Agranulocytosis secondary to cancer chemotherapy: Secondary | ICD-10-CM | POA: Diagnosis not present

## 2016-02-06 DIAGNOSIS — E119 Type 2 diabetes mellitus without complications: Secondary | ICD-10-CM

## 2016-02-06 DIAGNOSIS — Z66 Do not resuscitate: Secondary | ICD-10-CM | POA: Diagnosis not present

## 2016-02-06 DIAGNOSIS — C7951 Secondary malignant neoplasm of bone: Secondary | ICD-10-CM | POA: Diagnosis not present

## 2016-02-06 DIAGNOSIS — E11649 Type 2 diabetes mellitus with hypoglycemia without coma: Secondary | ICD-10-CM | POA: Diagnosis not present

## 2016-02-06 DIAGNOSIS — G893 Neoplasm related pain (acute) (chronic): Secondary | ICD-10-CM | POA: Diagnosis present

## 2016-02-06 DIAGNOSIS — N179 Acute kidney failure, unspecified: Secondary | ICD-10-CM | POA: Diagnosis present

## 2016-02-06 DIAGNOSIS — D72819 Decreased white blood cell count, unspecified: Secondary | ICD-10-CM | POA: Diagnosis present

## 2016-02-06 DIAGNOSIS — Z90710 Acquired absence of both cervix and uterus: Secondary | ICD-10-CM

## 2016-02-06 DIAGNOSIS — K829 Disease of gallbladder, unspecified: Secondary | ICD-10-CM | POA: Diagnosis not present

## 2016-02-06 DIAGNOSIS — R1011 Right upper quadrant pain: Secondary | ICD-10-CM | POA: Diagnosis not present

## 2016-02-06 DIAGNOSIS — Z79899 Other long term (current) drug therapy: Secondary | ICD-10-CM

## 2016-02-06 DIAGNOSIS — J189 Pneumonia, unspecified organism: Secondary | ICD-10-CM | POA: Diagnosis present

## 2016-02-06 DIAGNOSIS — Z853 Personal history of malignant neoplasm of breast: Secondary | ICD-10-CM | POA: Diagnosis not present

## 2016-02-06 DIAGNOSIS — D696 Thrombocytopenia, unspecified: Secondary | ICD-10-CM | POA: Diagnosis present

## 2016-02-06 DIAGNOSIS — I10 Essential (primary) hypertension: Secondary | ICD-10-CM | POA: Diagnosis present

## 2016-02-06 DIAGNOSIS — K219 Gastro-esophageal reflux disease without esophagitis: Secondary | ICD-10-CM | POA: Diagnosis not present

## 2016-02-06 DIAGNOSIS — J811 Chronic pulmonary edema: Secondary | ICD-10-CM | POA: Diagnosis not present

## 2016-02-06 DIAGNOSIS — Z85850 Personal history of malignant neoplasm of thyroid: Secondary | ICD-10-CM

## 2016-02-06 DIAGNOSIS — Z888 Allergy status to other drugs, medicaments and biological substances status: Secondary | ICD-10-CM

## 2016-02-06 DIAGNOSIS — Z9012 Acquired absence of left breast and nipple: Secondary | ICD-10-CM

## 2016-02-06 DIAGNOSIS — C50919 Malignant neoplasm of unspecified site of unspecified female breast: Secondary | ICD-10-CM | POA: Diagnosis not present

## 2016-02-06 DIAGNOSIS — Z79891 Long term (current) use of opiate analgesic: Secondary | ICD-10-CM

## 2016-02-06 DIAGNOSIS — Z6829 Body mass index (BMI) 29.0-29.9, adult: Secondary | ICD-10-CM

## 2016-02-06 DIAGNOSIS — R05 Cough: Secondary | ICD-10-CM | POA: Diagnosis not present

## 2016-02-06 DIAGNOSIS — Z808 Family history of malignant neoplasm of other organs or systems: Secondary | ICD-10-CM

## 2016-02-06 DIAGNOSIS — Y95 Nosocomial condition: Secondary | ICD-10-CM | POA: Diagnosis present

## 2016-02-06 DIAGNOSIS — F17210 Nicotine dependence, cigarettes, uncomplicated: Secondary | ICD-10-CM | POA: Diagnosis present

## 2016-02-06 LAB — COMPREHENSIVE METABOLIC PANEL
ALBUMIN: 2.9 g/dL — AB (ref 3.5–5.0)
ALK PHOS: 201 U/L — AB (ref 38–126)
ALT: 54 U/L (ref 14–54)
AST: 93 U/L — AB (ref 15–41)
Anion gap: 7 (ref 5–15)
BUN: 24 mg/dL — AB (ref 6–20)
CALCIUM: 9.3 mg/dL (ref 8.9–10.3)
CO2: 23 mmol/L (ref 22–32)
Chloride: 108 mmol/L (ref 101–111)
Creatinine, Ser: 1.41 mg/dL — ABNORMAL HIGH (ref 0.44–1.00)
GFR calc Af Amer: 51 mL/min — ABNORMAL LOW (ref >60)
GFR calc non Af Amer: 44 mL/min — ABNORMAL LOW (ref >60)
GLUCOSE: 114 mg/dL — AB (ref 65–99)
Potassium: 4.9 mmol/L (ref 3.5–5.1)
SODIUM: 138 mmol/L (ref 135–145)
TOTAL PROTEIN: 6.9 g/dL (ref 6.5–8.1)
Total Bilirubin: 0.3 mg/dL (ref 0.3–1.2)

## 2016-02-06 LAB — URINALYSIS, ROUTINE W REFLEX MICROSCOPIC
BILIRUBIN URINE: NEGATIVE
Glucose, UA: NEGATIVE mg/dL
HGB URINE DIPSTICK: NEGATIVE
Ketones, ur: NEGATIVE mg/dL
Leukocytes, UA: NEGATIVE
NITRITE: NEGATIVE
PROTEIN: NEGATIVE mg/dL
Specific Gravity, Urine: 1.01 (ref 1.005–1.030)
pH: 5.5 (ref 5.0–8.0)

## 2016-02-06 LAB — CBC WITH DIFFERENTIAL/PLATELET
BASOS ABS: 0 10*3/uL (ref 0.0–0.1)
BASOS PCT: 1 %
Eosinophils Absolute: 0 10*3/uL (ref 0.0–0.7)
Eosinophils Relative: 1 %
HCT: 43.6 % (ref 36.0–46.0)
HEMOGLOBIN: 14.4 g/dL (ref 12.0–15.0)
LYMPHS ABS: 0.1 10*3/uL — AB (ref 0.7–4.0)
LYMPHS PCT: 15 %
MCH: 31.9 pg (ref 26.0–34.0)
MCHC: 33 g/dL (ref 30.0–36.0)
MCV: 96.5 fL (ref 78.0–100.0)
Monocytes Absolute: 0 10*3/uL — ABNORMAL LOW (ref 0.1–1.0)
Monocytes Relative: 3 %
NEUTROS ABS: 0.8 10*3/uL — AB (ref 1.7–7.7)
Neutrophils Relative %: 80 %
Platelets: 51 10*3/uL — ABNORMAL LOW (ref 150–400)
RBC: 4.52 MIL/uL (ref 3.87–5.11)
RDW: 18.3 % — AB (ref 11.5–15.5)
WBC: 0.9 10*3/uL — CL (ref 4.0–10.5)

## 2016-02-06 LAB — I-STAT CG4 LACTIC ACID, ED
Lactic Acid, Venous: 2.96 mmol/L (ref 0.5–1.9)
Lactic Acid, Venous: 2.03 mmol/L (ref 0.5–1.9)

## 2016-02-06 LAB — LIPASE, BLOOD: Lipase: 65 U/L — ABNORMAL HIGH (ref 11–51)

## 2016-02-06 LAB — I-STAT TROPONIN, ED: TROPONIN I, POC: 0.01 ng/mL (ref 0.00–0.08)

## 2016-02-06 MED ORDER — SODIUM CHLORIDE 0.9 % IV BOLUS (SEPSIS)
1000.0000 mL | Freq: Once | INTRAVENOUS | Status: AC
  Administered 2016-02-06: 1000 mL via INTRAVENOUS

## 2016-02-06 MED ORDER — PIPERACILLIN-TAZOBACTAM 3.375 G IVPB 30 MIN
3.3750 g | Freq: Once | INTRAVENOUS | Status: AC
  Administered 2016-02-06: 3.375 g via INTRAVENOUS
  Filled 2016-02-06: qty 50

## 2016-02-06 MED ORDER — HYDROMORPHONE HCL 1 MG/ML IJ SOLN
1.0000 mg | Freq: Once | INTRAMUSCULAR | Status: AC
  Administered 2016-02-06: 1 mg via INTRAVENOUS
  Filled 2016-02-06: qty 1

## 2016-02-06 MED ORDER — SODIUM CHLORIDE 0.9 % IV BOLUS (SEPSIS)
1000.0000 mL | Freq: Once | INTRAVENOUS | Status: AC
  Administered 2016-02-07: 1000 mL via INTRAVENOUS

## 2016-02-06 MED ORDER — HYDROMORPHONE HCL 1 MG/ML IJ SOLN
1.0000 mg | Freq: Once | INTRAMUSCULAR | Status: AC
Start: 2016-02-06 — End: 2016-02-06
  Administered 2016-02-06: 1 mg via INTRAVENOUS
  Filled 2016-02-06: qty 1

## 2016-02-06 MED ORDER — IOPAMIDOL (ISOVUE-370) INJECTION 76%
100.0000 mL | Freq: Once | INTRAVENOUS | Status: AC | PRN
  Administered 2016-02-06: 80 mL via INTRAVENOUS

## 2016-02-06 NOTE — ED Notes (Signed)
US Abdomen in process at bedside at this time.

## 2016-02-06 NOTE — ED Notes (Signed)
PROTECTIVE PRECAUTIONS initiated at this time. 

## 2016-02-06 NOTE — ED Provider Notes (Signed)
[facility] DEPT Provider Note   CSN: [id number] Arrival date & time: 02/06/16  1651     History   Chief Complaint No chief complaint on file.   HPI Shelly Hall is a 45 y.o. female.  The history is provided by the patient.  Abdominal Pain   This is a new problem. The current episode started 2 days ago. The problem occurs constantly. The pain is located in the RUQ and chest. The quality of the pain is sharp and shooting. The pain is severe. Associated symptoms include anorexia. Pertinent negatives include fever, diarrhea, nausea, vomiting, dysuria and headaches. The symptoms are aggravated by certain positions and palpation. The symptoms are relieved by being still.   Pt was recently treated for possible bronchitis with Z-pak. Pt also with productive cough, SOB.   No prior h/o clots, but with on chemo for metastatic breast cancer.   Past Medical History:  Diagnosis Date  . Anemia   . Anxiety    no meds currently  . Breast cancer ([place]) 2015   left upper outer;  had chemo  . Bronchitis 2014  . Complication of anesthesia    makes pt. restless and unable to be still  . Depression    no meds currently  . GERD (gastroesophageal reflux disease)   . Heart murmur    never had any problems as adult  . High cholesterol   . Hypertension   . Radiation 06/23/14-08/11/14   Invasive ductal ca o left breast  . Sarcoidosis ([place])    no problems per patient   . SVD (spontaneous vaginal delivery)    x 2  . Thyroid cancer ([place]) 2015  . Type II diabetes mellitus [facility])     Patient Active Problem List   Diagnosis Date Noted  . Severe sepsis ([place]) 02/07/2016  . Acute cholecystitis 02/07/2016  . Leukopenia 02/07/2016  . Thrombocytopenia ([place]) 02/07/2016  . AKI (acute kidney injury) ([place]) 02/07/2016  . Cancer of overlapping sites of left breast ([place]) 01/23/2016  . Bone metastases ([place]) 01/23/2016  . Diaphoresis   . Tachycardia   . Elevated d-dimer   . Abdominal distension   .  Palliative care by specialist   . Neoplastic malignant related fatigue   . Arthralgia   . Uncontrolled pain 01/07/2016  . Intractable pain   . Palliative care encounter   . Diverticula of colon 09/28/2015  . Pain 09/28/2015  . Encounter for palliative care   . Cancer associated pain 09/18/2015  . Pain from bone metastases ([place]) 08/27/2015  . Pain due to malignant neoplasm metastatic to bone ([place]) 08/06/2015  . Genetic testing 04/09/2015  . Lymphedema of arm 02/18/2015  . S/P hysterectomy 12/09/2014  . Lymphedema 11/17/2014  . Gum symptoms 03/18/2014  . 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 Hot flashes 01/15/2014  . 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 Esophageal reflux 11/27/2013  . Rib pain 11/27/2013  . Recurrent genital herpes 11/06/2013  . Chemotherapy induced neutropenia ([place]) 11/06/2013  . Anemia, unspecified 11/06/2013  . Diabetes mellitus type II, non insulin dependent ([place]) 11/06/2013  . Left thyroid nodule 11/06/2013  . Sarcoidosis ([place]) 10/15/2013  . Mediastinal lymphadenopathy 11/21/2012  . Tobacco use disorder 11/21/2012    Past Surgical History:  Procedure Laterality Date  . ABDOMINAL HYSTERECTOMY    . BREAST BIOPSY Left 2015 X 3   biopsy  . ENDOBRONCHIAL ULTRASOUND Bilateral 12/02/2012   Procedure: ENDOBRONCHIAL ULTRASOUND;  Surgeon: Collene Gobble, MD;  Location: [facility];  Service: Cardiopulmonary;  Laterality: Bilateral;  . LAPAROSCOPIC ASSISTED VAGINAL HYSTERECTOMY N/A 12/09/2014   Procedure: LAPAROSCOPIC ASSISTED VAGINAL HYSTERECTOMY;  Surgeon: Cheri Fowler, MD;  Location: [facility];  Service: Gynecology;  Laterality: N/A;  . LEEP    . LUNG BIOPSY  2014  . MASTECTOMY W/ SENTINEL NODE BIOPSY Left 04/23/2014   & axillary LND  . MASTECTOMY W/ SENTINEL NODE BIOPSY Left 04/23/2014   Procedure: LEFT BREAST MASTECTOMY WITH SENTINEL LYMPH NODE BIOPSY AND AXILLARY LYMPH NODE DISECTION;  Surgeon: [name], MD;  Location:  [place];  Service: General;  Laterality: Left;  . PORT-A-CATH REMOVAL Left 02/03/2015   Procedure: REMOVAL PORT-A-CATH;  Surgeon: [name], MD;  Location: [place];  Service: General;  Laterality: Left;  . PORTACATH PLACEMENT N/A 10/22/2013   Procedure: INSERTION PORT-A-CATH;  Surgeon: [name], MD;  Location: [place];  Service: General;  Laterality: N/A;  . SALPINGOOPHORECTOMY Bilateral 12/09/2014   Procedure: SALPINGO OOPHORECTOMY;  Surgeon: Cheri Fowler, MD;  Location: [facility];  Service: Gynecology;  Laterality: Bilateral;  . THYROID LOBECTOMY Left 04/23/2014   Procedure: LEFT THYROID LOBECTOMY;  Surgeon: [name], MD;  Location: [place];  Service: General;  Laterality: Left;  . TUBAL LIGATION  2001  . WISDOM TOOTH EXTRACTION      OB History    No data available       Home Medications    Prior to Admission medications   Medication Sig Start Date End Date Taking? Authorizing Provider  amphetamine-dextroamphetamine (ADDERALL) 10 MG tablet Take 1 tablet (10 mg total) by mouth 2 (two) times daily with a meal. 01/18/16  Yes Mariel Aloe, MD  carvedilol (COREG) 12.5 MG tablet Take 1 tablet (12.5 mg total) by mouth 2 (two) times daily with a meal. 01/18/16  Yes Mariel Aloe, MD  ferrous sulfate 325 (65 FE) MG tablet Take 325 mg by mouth daily with breakfast.    Yes Historical Provider, MD  FLUoxetine (PROZAC) 20 MG tablet Take 20 mg by mouth daily.    Yes Historical Provider, MD  gabapentin (NEURONTIN) 300 MG capsule Take 300-600 mg by mouth 3 (three) times daily. Pt takes one capsule in the morning, one in the afternoon, and two at bedtime.   Yes Historical Provider, MD  HYDROmorphone (DILAUDID) 4 MG tablet Take 1 tablet (4 mg total) by mouth every 2 (two) hours as needed for severe pain. 01/28/16  Yes Chauncey Cruel, MD  letrozole [facility]) 2.5 MG tablet Take 1 tablet (2.5 mg total) by mouth daily. 08/06/15  Yes Chauncey Cruel, MD  lidocaine (LIDODERM) 5 % Place 2  patches onto the skin daily. Remove & Discard patch within 12 hours or as directed by MD 01/18/16  Yes Mariel Aloe, MD  loratadine-pseudoephedrine (CLARITIN-D 24-HOUR) 10-240 MG 24 hr tablet Take 1 tablet by mouth daily as needed for allergies.    Yes Historical Provider, MD  metFORMIN (GLUCOPHAGE) 1000 MG tablet Take 1,000 mg by mouth 2 (two) times daily with a meal.    Yes Historical Provider, MD  methadone (DOLOPHINE) 5 MG tablet Take 5 tablets (25 mg total) by mouth every 8 (eight) hours. 01/28/16  Yes Chauncey Cruel, MD  mometasone (NASONEX) 50 MCG/ACT nasal spray Place 1 spray into the nose 2 (two) times daily.   Yes Historical Provider, MD  omeprazole (PRILOSEC) 40 MG capsule Take 1 capsule (40 mg total) by mouth daily. 10/08/15  Yes Chauncey Cruel, MD  palbociclib (IBRANCE) 100 MG capsule Take 1 capsule (100 mg total) by mouth daily with breakfast. Take whole with food. 10/08/15  Yes Chauncey Cruel, MD  polyethylene glycol (MIRALAX / GLYCOLAX) packet Take 17 g by mouth daily.   Yes Historical Provider, MD  prochlorperazine (COMPAZINE) 10 MG tablet Take 1 tablet (10 mg total) by mouth every 6 (six) hours as needed for nausea or vomiting. 12/03/15  Yes Chauncey Cruel, MD  senna (SENOKOT) 8.6 MG TABS tablet Take 1 tablet (8.6 mg total) by mouth 2 (two) times daily. 01/18/16  Yes Mariel Aloe, MD  thiamine 100 MG tablet Take 1 tablet (100 mg total) by mouth daily. 01/18/16  Yes Mariel Aloe, MD    Family History Family History  Problem Relation Age of Onset  . Cancer Paternal Aunt     "bone cancer"; deceased 52s  . Cancer Paternal Uncle     stomach cancer; deceased 79s  . Cancer Paternal Aunt     unk. primary; currently 30s  . Asthma Mother   . Prostate cancer Maternal Grandfather 45    Social History Social History  Substance Use Topics  . Smoking status: Current Some Day Smoker    Packs/day: 0.25    Years: 25.00    Types: Cigarettes  . Smokeless tobacco: Never Used      Comment: 04/23/2014 "using vapor; down to 3-4 cigarettes/day now"  . Alcohol use 6.0 oz/week    4 Cans of beer, 4 Shots of liquor, 2 Standard drinks or equivalent per week     Allergies   Nyquil multi-symptom [pseudoeph-doxylamine-dm-apap]   Review of Systems Review of Systems  Constitutional: Negative for chills, fatigue and fever.  HENT: Negative for congestion and sore throat.   Eyes: Negative for visual disturbance.  Respiratory: Positive for cough and shortness of breath. Negative for chest tightness.   Cardiovascular: Positive for chest pain. Negative for palpitations.  Gastrointestinal: Positive for abdominal pain and anorexia. Negative for blood in stool, diarrhea, nausea and vomiting.  Genitourinary: Negative for decreased urine volume, difficulty urinating and dysuria.  Musculoskeletal: Negative for back pain and neck stiffness.  Skin: Negative for rash.  Neurological: Negative for light-headedness and headaches.  Psychiatric/Behavioral: Negative for confusion.  All other systems reviewed and are negative.    Physical Exam Updated Vital Signs BP 154/96   Pulse 97   Temp 99.7 F (37.6 C) (Oral)   Resp 22   Ht 5\' 3"  (1.6 m)   Wt 169 lb (76.7 kg)   LMP 12/07/2014 Comment: spotting  SpO2 98%   BMI 29.94 kg/m   Physical Exam  Constitutional: She is oriented to person, place, and time. She appears well-developed and well-nourished. No distress.  HENT:  Head: Normocephalic and atraumatic.  Nose: Nose normal.  Eyes: Conjunctivae and EOM are normal. Pupils are equal, round, and reactive to light. Right eye exhibits no discharge. Left eye exhibits no discharge. No scleral icterus.  Neck: Normal range of motion. Neck supple.  Cardiovascular: Regular rhythm.  Tachycardia present.  Exam reveals no gallop and no friction rub.   No murmur heard. Pulmonary/Chest: Effort normal and breath sounds normal. No stridor. No respiratory distress. She has no rales.  Abdominal:  Soft. She exhibits no distension. There is tenderness in the right upper quadrant and periumbilical area. There is positive Murphy's sign. There is no rigidity, no rebound, no guarding and no CVA tenderness.  Musculoskeletal: She exhibits no edema or tenderness.  Neurological: She is alert  and oriented to person, place, and time.  Skin: Skin is warm and dry. No rash noted. She is not diaphoretic. No erythema.  Psychiatric: She has a normal mood and affect.  Vitals reviewed.    ED Treatments / Results  Labs (all labs ordered are listed, but only abnormal results are displayed) Labs Reviewed  COMPREHENSIVE METABOLIC PANEL - Abnormal; Notable for the following:       Result Value   Glucose, Bld 114 (*)    BUN 24 (*)    Creatinine, Ser 1.41 (*)    Albumin 2.9 (*)    AST 93 (*)    Alkaline Phosphatase 201 (*)    GFR calc non Af Amer 44 (*)    GFR calc Af Amer 51 (*)    All other components within normal limits  CBC WITH DIFFERENTIAL/PLATELET - Abnormal; Notable for the following:    WBC 0.9 (*)    RDW 18.3 (*)    Platelets 51 (*)    Neutro Abs 0.8 (*)    Lymphs Abs 0.1 (*)    Monocytes Absolute 0.0 (*)    All other components within normal limits  LIPASE, BLOOD - Abnormal; Notable for the following:    Lipase 65 (*)    All other components within normal limits  I-STAT CG4 LACTIC ACID, ED - Abnormal; Notable for the following:    Lactic Acid, Venous 2.96 (*)    All other components within normal limits  I-STAT CG4 LACTIC ACID, ED - Abnormal; Notable for the following:    Lactic Acid, Venous 2.03 (*)    All other components within normal limits  CULTURE, BLOOD (ROUTINE X 2)  CULTURE, BLOOD (ROUTINE X 2)  URINE CULTURE  URINALYSIS, ROUTINE W REFLEX MICROSCOPIC (NOT AT [facility])  LACTIC ACID, PLASMA  LACTIC ACID, PLASMA  PROTIME-INR  APTT  COMPREHENSIVE METABOLIC PANEL  CBC  FIBRINOGEN  I-STAT TROPOININ, ED  TYPE AND SCREEN    EKG  EKG Interpretation None        Radiology Dg Chest 2 View  Result Date: 02/06/2016 CLINICAL DATA:  Productive cough and shortness of breath. History of metastatic breast cancer. EXAM: CHEST  2 VIEW COMPARISON:  PA and lateral chest 01/07/2016.  CT chest 01/17/2016. FINDINGS: Lung volumes are lower than on the prior chest film with associated basilar atelectasis. Peribronchial thickening is noted. No pneumothorax or pleural effusion. Heart size is normal. Surgical clips left axilla are noted. Extensive osseous sclerosis consistent with metastatic disease is seen. Remote fracture distal right clavicle is identified. IMPRESSION: Bronchitic change without focal process. Extensive osseous metastatic disease. Electronically Signed   By: Inge Rise M.D.   On: 02/06/2016 18:13   Ct Angio Chest Pe W And/or Wo Contrast  Result Date: 02/06/2016 CLINICAL DATA:  45 year old female with history of breast cancer. Rule out PE. EXAM: CT ANGIOGRAPHY CHEST CT ABDOMEN AND PELVIS WITH CONTRAST TECHNIQUE: Multidetector CT imaging of the chest was performed using the standard protocol during bolus administration of intravenous contrast. Multiplanar CT image reconstructions and MIPs were obtained to evaluate the vascular anatomy. Multidetector CT imaging of the abdomen and pelvis was performed using the standard protocol during bolus administration of intravenous contrast. CONTRAST:  100 cc Isovue 370 COMPARISON:  Chest CT dated 01/17/2016, abdominal CT dated 08/19/2015 and bone scan dated 01/10/2016 FINDINGS: CTA CHEST FINDINGS There is shallow inspiration. Minimal bibasilar dependent atelectatic changes. Diffuse airspace ground-glass density, likely related to atelectatic changes and hypovolemia. There is apparent diffuse minimal  interlobular septal thickening with Kerley B-lines, likely mild pulmonary edema. Small faint nodular densities along the bronchovascular interstitium predominantly in the right upper and middle lobes noted. Although these may  be inflammatory/infectious in etiology, an early pulmonary metastases or lymphangitic spread of the tumor is not entirely excluded. Clinical correlation and follow-up recommended. There is no pleural effusion or pneumothorax. The central airways are patent. The thoracic aorta appears unremarkable. The origins of the great vessels of the aortic arch appear patent. There is no CT evidence of pulmonary embolism. There is mild cardiomegaly. No pericardial effusion. There are bilateral hilar adenopathy. The largest lymph nodes measure up to 2.7 x 1.7 cm (previously 1.9 x 1.9 cm) in the right infrahilar region. Anterior mediastinal adenopathy in the prevascular space noted similar to prior study. Right posterior mediastinal/subcarinal adenopathy measures 2.3 x 2.3 cm. The esophagus is grossly unremarkable. Left thyroidectomy. Left axillary surgical clips noted. No axillary adenopathy identified. Left-sided mastectomy. The chest wall soft tissues are otherwise unremarkable. There is extensive osseous metastatic disease. There is nondisplaced fracture of the right posterior fourth and tenth ribs as seen on the prior CT, likely pathologic fracture. No new fracture. T1 and T8 compression fracture deformity similar to prior study. CT ABDOMEN and PELVIS FINDINGS No intra-abdominal free air or free fluid. There are extensive hepatic hypodense lesions compatible with metastatic disease. There is associated irregular appearance of the hepatic contour. There multiple small stones within the gallbladder. There is thickened appearance of the gallbladder wall. Ultrasound may provide better evaluation of the gallbladder if an acute cholecystitis is clinically suspected. The pancreas, spleen, adrenal glands, kidneys, visualized ureters, and urinary bladder appear unremarkable. Hysterectomy. There is moderate stool throughout the colon. No evidence of bowel obstruction or active inflammation. Normal appendix. There is mild aortoiliac  atherosclerotic disease. The origins of the celiac axis, SMA, IMA as well as the origins of the renal arteries appear patent. The the IVC, SMV, splenic vein, and main portal vein are patent. No portal venous gas identified. Upper abdominal adenopathy in the gastrohepatic space, and portacaval adenopathy. The abdominal wall soft tissues appear unremarkable. There is extensive osseous metastatic disease. There is pathologic compression fracture of the L5 vertebra with more than 50% loss of vertebral body height. There is retropulsed fracture fragment from the superior posterior corner of the L5 vertebral with moderate focal narrowing of the central canal. There is disc desiccation with vacuum phenomena at L4-5 and L5-S1. Overall there has been interval progression of osseous metastatic disease compared to the study dated 08/19/2015. Review of the MIP images confirms the above findings. IMPRESSION: No CT evidence of pulmonary embolism. Mild atelectatic changes with probable mild pulmonary edema. Small nodular densities predominantly in the right upper and middle lobes may be inflammatory or infectious in etiology. Metastatic disease or less likely early lymphangitic spread of tumor are not excluded. Clinical correlation and follow-up recommended. Interval progression of hilar and mediastinal adenopathy. Extensive hepatic metastatic disease. Small gallstones with diffuse gallbladder wall thickening. Ultrasound may provide better evaluation of the gallbladder if an acute cholecystitis is clinically suspected. Upper abdominal adenopathy. Extensive osseous metastatic disease with overall progression since the prior study. Multilevel compression deformities. L5 pathologic compression fracture, new from study dated 08/19/2015 with associated retropulsed fragment and focal narrowing of the central canal. Electronically Signed   By: Anner Crete M.D.   On: 02/06/2016 22:34   Ct Abdomen Pelvis W Contrast  Result Date:  02/06/2016 CLINICAL DATA:  45 year old female with history of  breast cancer. Rule out PE. EXAM: CT ANGIOGRAPHY CHEST CT ABDOMEN AND PELVIS WITH CONTRAST TECHNIQUE: Multidetector CT imaging of the chest was performed using the standard protocol during bolus administration of intravenous contrast. Multiplanar CT image reconstructions and MIPs were obtained to evaluate the vascular anatomy. Multidetector CT imaging of the abdomen and pelvis was performed using the standard protocol during bolus administration of intravenous contrast. CONTRAST:  100 cc Isovue 370 COMPARISON:  Chest CT dated 01/17/2016, abdominal CT dated 08/19/2015 and bone scan dated 01/10/2016 FINDINGS: CTA CHEST FINDINGS There is shallow inspiration. Minimal bibasilar dependent atelectatic changes. Diffuse airspace ground-glass density, likely related to atelectatic changes and hypovolemia. There is apparent diffuse minimal interlobular septal thickening with Kerley B-lines, likely mild pulmonary edema. Small faint nodular densities along the bronchovascular interstitium predominantly in the right upper and middle lobes noted. Although these may be inflammatory/infectious in etiology, an early pulmonary metastases or lymphangitic spread of the tumor is not entirely excluded. Clinical correlation and follow-up recommended. There is no pleural effusion or pneumothorax. The central airways are patent. The thoracic aorta appears unremarkable. The origins of the great vessels of the aortic arch appear patent. There is no CT evidence of pulmonary embolism. There is mild cardiomegaly. No pericardial effusion. There are bilateral hilar adenopathy. The largest lymph nodes measure up to 2.7 x 1.7 cm (previously 1.9 x 1.9 cm) in the right infrahilar region. Anterior mediastinal adenopathy in the prevascular space noted similar to prior study. Right posterior mediastinal/subcarinal adenopathy measures 2.3 x 2.3 cm. The esophagus is grossly unremarkable. Left  thyroidectomy. Left axillary surgical clips noted. No axillary adenopathy identified. Left-sided mastectomy. The chest wall soft tissues are otherwise unremarkable. There is extensive osseous metastatic disease. There is nondisplaced fracture of the right posterior fourth and tenth ribs as seen on the prior CT, likely pathologic fracture. No new fracture. T1 and T8 compression fracture deformity similar to prior study. CT ABDOMEN and PELVIS FINDINGS No intra-abdominal free air or free fluid. There are extensive hepatic hypodense lesions compatible with metastatic disease. There is associated irregular appearance of the hepatic contour. There multiple small stones within the gallbladder. There is thickened appearance of the gallbladder wall. Ultrasound may provide better evaluation of the gallbladder if an acute cholecystitis is clinically suspected. The pancreas, spleen, adrenal glands, kidneys, visualized ureters, and urinary bladder appear unremarkable. Hysterectomy. There is moderate stool throughout the colon. No evidence of bowel obstruction or active inflammation. Normal appendix. There is mild aortoiliac atherosclerotic disease. The origins of the celiac axis, SMA, IMA as well as the origins of the renal arteries appear patent. The the IVC, SMV, splenic vein, and main portal vein are patent. No portal venous gas identified. Upper abdominal adenopathy in the gastrohepatic space, and portacaval adenopathy. The abdominal wall soft tissues appear unremarkable. There is extensive osseous metastatic disease. There is pathologic compression fracture of the L5 vertebra with more than 50% loss of vertebral body height. There is retropulsed fracture fragment from the superior posterior corner of the L5 vertebral with moderate focal narrowing of the central canal. There is disc desiccation with vacuum phenomena at L4-5 and L5-S1. Overall there has been interval progression of osseous metastatic disease compared to the  study dated 08/19/2015. Review of the MIP images confirms the above findings. IMPRESSION: No CT evidence of pulmonary embolism. Mild atelectatic changes with probable mild pulmonary edema. Small nodular densities predominantly in the right upper and middle lobes may be inflammatory or infectious in etiology. Metastatic disease or less likely  early lymphangitic spread of tumor are not excluded. Clinical correlation and follow-up recommended. Interval progression of hilar and mediastinal adenopathy. Extensive hepatic metastatic disease. Small gallstones with diffuse gallbladder wall thickening. Ultrasound may provide better evaluation of the gallbladder if an acute cholecystitis is clinically suspected. Upper abdominal adenopathy. Extensive osseous metastatic disease with overall progression since the prior study. Multilevel compression deformities. L5 pathologic compression fracture, new from study dated 08/19/2015 with associated retropulsed fragment and focal narrowing of the central canal. Electronically Signed   By: Anner Crete M.D.   On: 02/06/2016 22:34   US Abdomen Limited Ruq  Result Date: 02/06/2016 CLINICAL DATA:  RIGHT upper quadrant pain for 3 days, history hypertension, sarcoidosis, type II diabetes mellitus, thyroid cancer, breast cancer EXAM: US ABDOMEN LIMITED - RIGHT UPPER QUADRANT COMPARISON:  CT abdomen pelvis 02/06/2016 FINDINGS: Gallbladder: Thickened gallbladder wall. Dependent shadowing calculi. Small gallbladder polyps noted. Sonographic Murphy sign present. No definite pericholecystic fluid. Common bile duct: Diameter: 6 mm diameter, upper normal Liver: Markedly heterogeneous appearance of liver parenchyma with multiple rounded areas of hypo echogenicity compatible with widespread hepatic metastases as noted on earlier CT. Hepatopetal portal venous flow. No RIGHT upper quadrant free fluid. IMPRESSION: Thickened gallbladder wall with gallstones, gallbladder polyps, and sonographic  Murphy sign suspicious for acute cholecystitis. Widespread hepatic metastatic disease. Electronically Signed   By: Lavonia Dana M.D.   On: 02/06/2016 22:50    Procedures Procedures (including critical care time)   EMERGENCY DEPARTMENT BILIARY ULTRASOUND INTERPRETATION "Study: Limited Abdominal Ultrasound of the gallbladder and common bile duct."  INDICATIONS: RUQ pain Indication: Multiple views of the gallbladder and common bile duct were obtained in real-time with a Multi-frequency probe." PERFORMED BY:  Myself IMAGES ARCHIVED?: Yes FINDINGS: Gallstones present, Sonographic Murphy's sign present and Common bile duct enlarged unable to measure wall thickness due to numerous stones shadowing. LIMITATIONS: Body Habitus and Abdominal pain INTERPRETATION: Cholelithiasis and Cholecystitis  CPT Code [phone] (limited abdominal)  Medications Ordered in ED Medications  methadone (DOLOPHINE) tablet 25 mg (not administered)  gabapentin (NEURONTIN) capsule 300-600 mg (not administered)  lidocaine (LIDODERM) 5 % 2 patch (not administered)  fluticasone (FLONASE) 50 MCG/ACT nasal spray 2 spray (not administered)  polyethylene glycol (MIRALAX / GLYCOLAX) packet 17 g (not administered)  senna (SENOKOT) tablet 8.6 mg (not administered)  thiamine tablet 100 mg (not administered)  FLUoxetine (PROZAC) tablet 20 mg (not administered)  ferrous sulfate tablet 325 mg (not administered)  insulin aspart (novoLOG) injection 0-9 Units (not administered)  sodium chloride flush (NS) 0.9 % injection 3 mL (not administered)  0.9 %  sodium chloride infusion (not administered)  magnesium hydroxide (MILK OF MAGNESIA) suspension 30 mL (not administered)  sorbitol 70 % solution 30 mL (not administered)  sodium phosphate (FLEET) 7-19 GM/118ML enema 1 enema (not administered)  ondansetron (ZOFRAN) tablet 4 mg (not administered)    Or  ondansetron (ZOFRAN) injection 4 mg (not administered)  HYDROmorphone (DILAUDID)  injection 1.5 mg (not administered)  sodium chloride 0.9 % bolus 1,000 mL (0 mLs Intravenous Stopped 02/06/16 2030)  HYDROmorphone (DILAUDID) injection 1 mg (1 mg Intravenous Given 02/06/16 2009)  iopamidol (ISOVUE-370) 76 % injection 100 mL (80 mLs Intravenous Contrast Given 02/06/16 2127)  HYDROmorphone (DILAUDID) injection 1 mg (1 mg Intravenous Given 02/06/16 2115)  sodium chloride 0.9 % bolus 1,000 mL (0 mLs Intravenous Stopped 02/06/16 2328)  piperacillin-tazobactam (ZOSYN) IVPB 3.375 g (0 g Intravenous Stopped 02/06/16 2208)  HYDROmorphone (DILAUDID) injection 1 mg (1 mg Intravenous Given 02/06/16 2306)  sodium chloride 0.9 % bolus 1,000 mL (0 mLs Intravenous Stopped 02/07/16 0029)     Initial Impression / Assessment and Plan / ED Course  I have reviewed the triage vital signs and the nursing notes.  Pertinent labs & imaging results that were available during my care of the patient were reviewed by me and considered in my medical decision making (see chart for details).  Clinical Course    Presentation a highly concerning for acute cholecystitis. However given patient's significant cancer history we'll like to obtain a CT of the abdomen to identify any other etiology. Patient also with shortness of breath, would also like to obtain CT chest to rule out PE or pneumonia.  Bedside ultrasound with numerous gallstones and common bile duct dilatation with positive Murphy sign. Elevated lactic acid at 2.9. CBC with leukopenia, thrombocytopenia. Patient was started on Zosyn and IV fluids.    CT chest without evidence of pulmonary embolism or pneumonia. CT abdomen with multiple liver metastases, and evidence to suggest acute cholecystitis. Formal right upper quadrant confirming diagnosis. Surgery consulted who assessed the patient in the emergency department. The case determined the patient is not a good surgical candidate at this time. Would recommend percutaneous drain placement by IR and medicine  admission.  Appreciate hospitalist service for stepdown admission.  CRITICAL CARE Performed by: Grayce Sessions Charmin Aguiniga Total critical care time: 40 minutes Critical care time was exclusive of separately billable procedures and treating other patients. Critical care was necessary to treat or prevent imminent or life-threatening deterioration. Critical care was time spent personally by me on the following activities: development of treatment plan with patient and/or surrogate as well as nursing, discussions with consultants, evaluation of patient's response to treatment, examination of patient, obtaining history from patient or surrogate, ordering and performing treatments and interventions, ordering and review of laboratory studies, ordering and review of radiographic studies, pulse oximetry and re-evaluation of patient's condition.    Final Clinical Impressions(s) / ED Diagnoses   Final diagnoses:  RUQ pain  Acute cholecystitis  Sepsis, due to unspecified organism ([place])  Lactic acidosis  Leukopenia  Thrombocytopenia ([place])    Disposition: Admit  Condition: serious    Fatima Blank, MD 02/07/16 [phone]

## 2016-02-06 NOTE — ED Triage Notes (Signed)
Pt states she has been coughing up a yellow thick mucus. Pt was placed on a Z-pack that finished yesterday. Pt is now feeling SOB. Lungs sound clear but pt c/o pain when taking a deep breath. Pt has breast cancer that has metastasized to bone. Pt is currently being treated and doing a 21 day regimen that ends Thursday.

## 2016-02-06 NOTE — ED Notes (Signed)
Patient transported to CT 

## 2016-02-06 NOTE — Consult Note (Signed)
Re:   Shelly Hall DOB:   Jun 22, 1970 MRN:   [id number]   [facility] Surgical Consultation  ASSESSMENT AND PLAN: 1.  Cholecystitis, cholelithiasis  Lactic acid - 2.96 originally, now down to 2.03 - 02/06/2016  With her extensive liver mets - I'm not sure that the gall bladder is the sole source of RUQ pain.  She is not a surgical candidate with her severe leukopenia and thrombocytopenia.  If she needs something done to the gall bladder, it will have to be a perc drain.  For now IVF, antibiotics, and resuscitation are in order.  I gave the family copies of the CT scan and [place] - so they are aware of the progression of her disease.  She will need oncology to weight in on her treatment plan.  2.  Severe leukopenia and thrombocytopena  WBC - 900 - 02/06/2016  Plts - 51,000 - 02/06/2016  3.  Significant metastatic disease to the liver - I'm surprised that the LFT's are worse.  In reading Dr. Virgie Dad note of 02/02/2016 - the advanced liver disease will be new to him.  It appears to mean that her currently therapy is not controlling her disease and her prognosis is poorer than previously thought.  4.  Metastatic breast cancer  Treating oncologist - Dr. Jana Hakim  Left mastectomy with left axillary node dissection - 04/23/2014 by Dr. Barry Dienes + adjuvant rad tx to left chest by Dr. Pablo Ledger  Metastatic recurrence noted Mar 2017  5.  Pain issues - secondary to met breast cancer  She is taking Methadone - 25 mg QID and Dilaudid 4 mg 4 to 6 times per day  6.  History of sarcoidosis  7.  History of thyroid adenoma  Left thyroid lobectomy by Dr. Barry Dienes - 04/23/2014 8.  Acute renal injury  Creat - 1.41 - 02/06/2016 9.  Recent headaches - Dr. Jana Hakim wrote in his note about getting a brain MRI  No chief complaint on file.  REFERRING PHYSICIAN:  Dr. Leonette Monarch, [facility] ER  HISTORY OF PRESENT ILLNESS: Shelly Hall is a 45 y.o. (DOB: [birth year])  AA female whose primary care physician is Elyn Peers, MD  and comes to [facility] ER today for abdominal/chest pain.  Mother, Sidney Ace, friend, Morrison, friend's mother, Otelia Sergeant, and son, Ashayla Subia at bed side.  She has had severe pain secondary to mets to her bone for some time.  But over the last week, she has had increasing RUQ pain and right chest pain.  Her appetite has been poor and she has had no energy.  Breast cancer history: Her breast cancer was originally found by biopsy of 2 of the left breast masses and the suspicious left breast lymph node on 10/03/2013 showed ([id number]) an invasive ductal carcinoma, grade 2, estrogen receptor 90% positive, progesterone receptor 40% positive, with an MIB-1 of 20% and no HER-2 amplification.   She is currently on letrozole, palbociclib, and alendronate for metastatic breast ca.  She has chronic pain now, spends about 1/2 day in bed, she last saw Dr. Jana Hakim on 02/02/2016.  US liver - 02/06/2016 - Thickened gallbladder wall with gallstones, gallbladder polyps, and sonographic Murphy sign suspicious for acute cholecystitis.  Widespread hepatic metastatic disease.  CT scan of chest and abdomen - 02/06/2016 -  No CT evidence of pulmonary embolism.  Mild atelectatic changes with probable mild pulmonary edema. Small nodular densities predominantly in the right upper and middle lobes may be inflammatory or infectious in etiology.  Metastatic disease or less likely early lymphangitic spread of tumor are not excluded. Clinical correlation and follow-up recommended.  Interval progression of hilar and mediastinal adenopathy.  Extensive hepatic metastatic disease.  Small gallstones with diffuse gallbladder wall thickening. Ultrasound may provide better evaluation of the gallbladder if an acute cholecystitis is clinically suspected.  Upper abdominal adenopathy.  Extensive osseous metastatic disease with overall progression since the prior study. Multilevel compression deformities. L5  pathologic compression fracture, new from study dated 08/19/2015 with associated retropulsed fragment and focal narrowing of the central canal.    Past Medical History:  Diagnosis Date  . Anemia   . Anxiety    no meds currently  . Breast cancer ([place]) 2015   left upper outer;  had chemo  . Bronchitis 2014  . Complication of anesthesia    makes pt. restless and unable to be still  . Depression    no meds currently  . GERD (gastroesophageal reflux disease)   . Heart murmur    never had any problems as adult  . High cholesterol   . Hypertension   . Radiation 06/23/14-08/11/14   Invasive ductal ca o left breast  . Sarcoidosis ([place])    no problems per patient   . SVD (spontaneous vaginal delivery)    x 2  . Thyroid cancer ([place]) 2015  . Type II diabetes mellitus ([place])       Past Surgical History:  Procedure Laterality Date  . ABDOMINAL HYSTERECTOMY    . BREAST BIOPSY Left 2015 X 3   biopsy  . ENDOBRONCHIAL ULTRASOUND Bilateral 12/02/2012   Procedure: ENDOBRONCHIAL ULTRASOUND;  Surgeon: Collene Gobble, MD;  Location: [facility];  Service: Cardiopulmonary;  Laterality: Bilateral;  . LAPAROSCOPIC ASSISTED VAGINAL HYSTERECTOMY N/A 12/09/2014   Procedure: LAPAROSCOPIC ASSISTED VAGINAL HYSTERECTOMY;  Surgeon: Cheri Fowler, MD;  Location: [place] [facility];  Service: Gynecology;  Laterality: N/A;  . LEEP    . LUNG BIOPSY  2014  . MASTECTOMY W/ SENTINEL NODE BIOPSY Left 04/23/2014   & axillary LND  . MASTECTOMY W/ SENTINEL NODE BIOPSY Left 04/23/2014   Procedure: LEFT BREAST MASTECTOMY WITH SENTINEL LYMPH NODE BIOPSY AND AXILLARY LYMPH NODE DISECTION;  Surgeon: [name], MD;  Location: [place];  Service: General;  Laterality: Left;  . PORT-A-CATH REMOVAL Left 02/03/2015   Procedure: REMOVAL PORT-A-CATH;  Surgeon: [name], MD;  Location: [place];  Service: General;  Laterality: Left;  . PORTACATH PLACEMENT N/A 10/22/2013   Procedure: INSERTION PORT-A-CATH;  Surgeon: [name], MD;  Location: [place];  Service: General;  Laterality: N/A;  . SALPINGOOPHORECTOMY Bilateral 12/09/2014   Procedure: SALPINGO OOPHORECTOMY;  Surgeon: Cheri Fowler, MD;  Location: [place] [facility];  Service: Gynecology;  Laterality: Bilateral;  . THYROID LOBECTOMY Left 04/23/2014   Procedure: LEFT THYROID LOBECTOMY;  Surgeon: [name], MD;  Location: [place];  Service: General;  Laterality: Left;  . TUBAL LIGATION  2001  . WISDOM TOOTH EXTRACTION        No current facility-administered medications for this encounter.    Current Outpatient Prescriptions  Medication Sig Dispense Refill  . amphetamine-dextroamphetamine (ADDERALL) 10 MG tablet Take 1 tablet (10 mg total) by mouth 2 (two) times daily with a meal. 60 tablet 0  . carvedilol (COREG) 12.5 MG tablet Take 1 tablet (12.5 mg total) by mouth 2 (two) times daily with a meal. 60 tablet 0  . ferrous sulfate 325 (65 FE) MG tablet Take 325 mg by mouth daily with breakfast.     .  FLUoxetine (PROZAC) 20 MG tablet Take 20 mg by mouth daily.     Marland Kitchen gabapentin (NEURONTIN) 300 MG capsule Take 300-600 mg by mouth 3 (three) times daily. Pt takes one capsule in the morning, one in the afternoon, and two at bedtime.    Marland Kitchen HYDROmorphone (DILAUDID) 4 MG tablet Take 1 tablet (4 mg total) by mouth every 2 (two) hours as needed for severe pain. 120 tablet 0  . letrozole (FEMARA) 2.5 MG tablet Take 1 tablet (2.5 mg total) by mouth daily. 90 tablet 4  . lidocaine (LIDODERM) 5 % Place 2 patches onto the skin daily. Remove & Discard patch within 12 hours or as directed by MD 30 patch 0  . loratadine-pseudoephedrine (CLARITIN-D 24-HOUR) 10-240 MG 24 hr tablet Take 1 tablet by mouth daily as needed for allergies.     . metFORMIN (GLUCOPHAGE) 1000 MG tablet Take 1,000 mg by mouth 2 (two) times daily with a meal.     . methadone (DOLOPHINE) 5 MG tablet Take 5 tablets (25 mg total) by mouth every 8 (eight) hours. 225 tablet 0  . mometasone (NASONEX) 50 MCG/ACT nasal  spray Place 1 spray into the nose 2 (two) times daily.    Marland Kitchen omeprazole (PRILOSEC) 40 MG capsule Take 1 capsule (40 mg total) by mouth daily. 90 capsule 12  . palbociclib (IBRANCE) 100 MG capsule Take 1 capsule (100 mg total) by mouth daily with breakfast. Take whole with food. 21 capsule 2  . polyethylene glycol (MIRALAX / GLYCOLAX) packet Take 17 g by mouth daily.    . prochlorperazine (COMPAZINE) 10 MG tablet Take 1 tablet (10 mg total) by mouth every 6 (six) hours as needed for nausea or vomiting. 30 tablet 0  . senna (SENOKOT) 8.6 MG TABS tablet Take 1 tablet (8.6 mg total) by mouth 2 (two) times daily. 60 each 0  . thiamine 100 MG tablet Take 1 tablet (100 mg total) by mouth daily. 30 tablet 0      Allergies  Allergen Reactions  . Nyquil Multi-Symptom [Pseudoeph-Doxylamine-Dm-Apap] Itching and Other (See Comments)    Reaction:  Skin peeling on hands/feet     REVIEW OF SYSTEMS: Skin:  No history of rash.  No history of abnormal moles. Infection:  No history of hepatitis or HIV.  No history of MRSA. Neurologic:  Recent headaches - Dr. Jana Hakim wrote in his note about getting a brain MRI Cardiac:  No history of hypertension. No history of heart disease.   Pulmonary:  Sarcoidosis since 2013  Endocrine:  No diabetes. No thyroid disease. Gastrointestinal:  See HPI Urologic:  No history of kidney stones.  No history of bladder infections. GYN - Hysterectomy - [place] - 12/2014 Musculoskeletal:  Has known bone mets Hematologic:  Leukopenia and thrombocytopenia Psycho-social:  The patient is oriented.   The patient has no obvious psychologic or social impairment to understanding our conversation and plan.  SOCIAL and FAMILY HISTORY: Mother, Sidney Ace, friend, Melody Jenne Campus, friend's mother, Otelia Sergeant, and son, Dejanee Thibeaux at bed side. She has one other son.  PHYSICAL EXAM: BP 142/97   Pulse 98   Temp 99.7 F (37.6 C) (Oral)   Resp 24   Ht _0  (1.6 m)   Wt  76.7 kg (169 lb)   LMP 12/07/2014 Comment: spotting  SpO2 99%   BMI 29.94 kg/m   General: AA F who is alert.  She is wearing a mask, but does not feel good. HEENT: Normal. Pupils equal. Neck: Supple.  No mass.  No thyroid mass. Lymph Nodes:  No supraclavicular or cervical nodes. Breast - Right - no mass  Left - absent, no mass Lungs: Clear to auscultation and symmetric breath sounds. Heart:  RRR. No murmur or rub.  Abdomen: Soft. No mass. No hernia. Decreased bowel sounds.  RUQ pain. Rectal: Not done. Extremities:  Weak upper and lower extremities. Neurologic:  Grossly intact to motor and sensory function. Psychiatric:  Behavior is normal, though she does not feel good.  DATA REVIEWED: Epic notes  Alphonsa Overall, MD,  [facility], [place] [address]  [place], [place]    [place] Phone:  [phone] FAX:  [phone]

## 2016-02-07 ENCOUNTER — Encounter (HOSPITAL_COMMUNITY): Payer: Self-pay | Admitting: Family Medicine

## 2016-02-07 DIAGNOSIS — C50919 Malignant neoplasm of unspecified site of unspecified female breast: Secondary | ICD-10-CM

## 2016-02-07 DIAGNOSIS — J811 Chronic pulmonary edema: Secondary | ICD-10-CM | POA: Diagnosis present

## 2016-02-07 DIAGNOSIS — E872 Acidosis, unspecified: Secondary | ICD-10-CM

## 2016-02-07 DIAGNOSIS — C787 Secondary malignant neoplasm of liver and intrahepatic bile duct: Secondary | ICD-10-CM

## 2016-02-07 DIAGNOSIS — N179 Acute kidney failure, unspecified: Secondary | ICD-10-CM | POA: Diagnosis not present

## 2016-02-07 DIAGNOSIS — J189 Pneumonia, unspecified organism: Secondary | ICD-10-CM

## 2016-02-07 DIAGNOSIS — E7800 Pure hypercholesterolemia, unspecified: Secondary | ICD-10-CM | POA: Diagnosis present

## 2016-02-07 DIAGNOSIS — G893 Neoplasm related pain (acute) (chronic): Secondary | ICD-10-CM | POA: Diagnosis present

## 2016-02-07 DIAGNOSIS — C7951 Secondary malignant neoplasm of bone: Secondary | ICD-10-CM | POA: Diagnosis not present

## 2016-02-07 DIAGNOSIS — R1011 Right upper quadrant pain: Secondary | ICD-10-CM | POA: Diagnosis not present

## 2016-02-07 DIAGNOSIS — T451X5A Adverse effect of antineoplastic and immunosuppressive drugs, initial encounter: Secondary | ICD-10-CM | POA: Diagnosis present

## 2016-02-07 DIAGNOSIS — I10 Essential (primary) hypertension: Secondary | ICD-10-CM | POA: Diagnosis present

## 2016-02-07 DIAGNOSIS — A419 Sepsis, unspecified organism: Secondary | ICD-10-CM | POA: Diagnosis present

## 2016-02-07 DIAGNOSIS — E11649 Type 2 diabetes mellitus with hypoglycemia without coma: Secondary | ICD-10-CM | POA: Diagnosis present

## 2016-02-07 DIAGNOSIS — F329 Major depressive disorder, single episode, unspecified: Secondary | ICD-10-CM | POA: Diagnosis present

## 2016-02-07 DIAGNOSIS — Z6829 Body mass index (BMI) 29.0-29.9, adult: Secondary | ICD-10-CM | POA: Diagnosis not present

## 2016-02-07 DIAGNOSIS — K219 Gastro-esophageal reflux disease without esophagitis: Secondary | ICD-10-CM | POA: Diagnosis present

## 2016-02-07 DIAGNOSIS — D649 Anemia, unspecified: Secondary | ICD-10-CM

## 2016-02-07 DIAGNOSIS — Z66 Do not resuscitate: Secondary | ICD-10-CM | POA: Diagnosis present

## 2016-02-07 DIAGNOSIS — K810 Acute cholecystitis: Secondary | ICD-10-CM | POA: Diagnosis not present

## 2016-02-07 DIAGNOSIS — R6520 Severe sepsis without septic shock: Secondary | ICD-10-CM

## 2016-02-07 DIAGNOSIS — D869 Sarcoidosis, unspecified: Secondary | ICD-10-CM | POA: Diagnosis present

## 2016-02-07 DIAGNOSIS — D701 Agranulocytosis secondary to cancer chemotherapy: Secondary | ICD-10-CM | POA: Diagnosis present

## 2016-02-07 DIAGNOSIS — K8000 Calculus of gallbladder with acute cholecystitis without obstruction: Secondary | ICD-10-CM | POA: Diagnosis present

## 2016-02-07 DIAGNOSIS — R0902 Hypoxemia: Secondary | ICD-10-CM | POA: Diagnosis present

## 2016-02-07 DIAGNOSIS — F419 Anxiety disorder, unspecified: Secondary | ICD-10-CM | POA: Diagnosis present

## 2016-02-07 DIAGNOSIS — D696 Thrombocytopenia, unspecified: Secondary | ICD-10-CM | POA: Diagnosis present

## 2016-02-07 DIAGNOSIS — Y95 Nosocomial condition: Secondary | ICD-10-CM | POA: Diagnosis present

## 2016-02-07 DIAGNOSIS — F17210 Nicotine dependence, cigarettes, uncomplicated: Secondary | ICD-10-CM | POA: Diagnosis present

## 2016-02-07 DIAGNOSIS — D72819 Decreased white blood cell count, unspecified: Secondary | ICD-10-CM | POA: Diagnosis present

## 2016-02-07 DIAGNOSIS — C50912 Malignant neoplasm of unspecified site of left female breast: Secondary | ICD-10-CM | POA: Diagnosis present

## 2016-02-07 LAB — COMPREHENSIVE METABOLIC PANEL
ALBUMIN: 2.5 g/dL — AB (ref 3.5–5.0)
ALT: 48 U/L (ref 14–54)
ANION GAP: 6 (ref 5–15)
AST: 85 U/L — AB (ref 15–41)
Alkaline Phosphatase: 176 U/L — ABNORMAL HIGH (ref 38–126)
BILIRUBIN TOTAL: 0.5 mg/dL (ref 0.3–1.2)
BUN: 21 mg/dL — AB (ref 6–20)
CO2: 23 mmol/L (ref 22–32)
Calcium: 8.6 mg/dL — ABNORMAL LOW (ref 8.9–10.3)
Chloride: 112 mmol/L — ABNORMAL HIGH (ref 101–111)
Creatinine, Ser: 1.37 mg/dL — ABNORMAL HIGH (ref 0.44–1.00)
GFR calc Af Amer: 53 mL/min — ABNORMAL LOW (ref >60)
GFR calc non Af Amer: 45 mL/min — ABNORMAL LOW (ref >60)
GLUCOSE: 88 mg/dL (ref 65–99)
POTASSIUM: 5 mmol/L (ref 3.5–5.1)
SODIUM: 141 mmol/L (ref 135–145)
TOTAL PROTEIN: 6.4 g/dL — AB (ref 6.5–8.1)

## 2016-02-07 LAB — LACTIC ACID, PLASMA
LACTIC ACID, VENOUS: 2.1 mmol/L — AB (ref 0.5–1.9)
Lactic Acid, Venous: 1.7 mmol/L (ref 0.5–1.9)

## 2016-02-07 LAB — GLUCOSE, CAPILLARY
GLUCOSE-CAPILLARY: 123 mg/dL — AB (ref 65–99)
GLUCOSE-CAPILLARY: 68 mg/dL (ref 65–99)
GLUCOSE-CAPILLARY: 52 mg/dL — AB (ref 65–99)
GLUCOSE-CAPILLARY: 81 mg/dL (ref 65–99)
Glucose-Capillary: 93 mg/dL (ref 65–99)
Glucose-Capillary: 67 mg/dL (ref 65–99)
Glucose-Capillary: 112 mg/dL — ABNORMAL HIGH (ref 65–99)
Glucose-Capillary: 68 mg/dL (ref 65–99)
Glucose-Capillary: 104 mg/dL — ABNORMAL HIGH (ref 65–99)
Glucose-Capillary: 83 mg/dL (ref 65–99)

## 2016-02-07 LAB — CBC
HEMATOCRIT: 21.2 % — AB (ref 36.0–46.0)
HEMOGLOBIN: 6.9 g/dL — AB (ref 12.0–15.0)
MCH: 31.5 pg (ref 26.0–34.0)
MCHC: 32.5 g/dL (ref 30.0–36.0)
MCV: 96.8 fL (ref 78.0–100.0)
Platelets: 74 10*3/uL — ABNORMAL LOW (ref 150–400)
RBC: 2.19 MIL/uL — ABNORMAL LOW (ref 3.87–5.11)
RDW: 18.4 % — AB (ref 11.5–15.5)
WBC: 1.9 10*3/uL — ABNORMAL LOW (ref 4.0–10.5)

## 2016-02-07 LAB — EXPECTORATED SPUTUM ASSESSMENT W REFEX TO RESP CULTURE

## 2016-02-07 LAB — STREP PNEUMONIAE URINARY ANTIGEN: STREP PNEUMO URINARY ANTIGEN: NEGATIVE

## 2016-02-07 LAB — HIV ANTIBODY (ROUTINE TESTING W REFLEX): HIV SCREEN 4TH GENERATION: NONREACTIVE

## 2016-02-07 LAB — DIC (DISSEMINATED INTRAVASCULAR COAGULATION) PANEL
APTT: 41 s — AB (ref 24–36)
D DIMER QUANT: 15.27 ug{FEU}/mL — AB (ref 0.00–0.50)
FIBRINOGEN: 632 mg/dL — AB (ref 210–475)
INR: 1
PLATELETS: 74 10*3/uL — AB (ref 150–400)
PROTHROMBIN TIME: 13.2 s (ref 11.4–15.2)

## 2016-02-07 LAB — EXPECTORATED SPUTUM ASSESSMENT W GRAM STAIN, RFLX TO RESP C

## 2016-02-07 LAB — MRSA PCR SCREENING: MRSA by PCR: NEGATIVE

## 2016-02-07 LAB — ABO/RH: ABO/RH(D): B POS

## 2016-02-07 LAB — PREPARE RBC (CROSSMATCH)

## 2016-02-07 LAB — DIC (DISSEMINATED INTRAVASCULAR COAGULATION)PANEL: Smear Review: NONE SEEN

## 2016-02-07 MED ORDER — SODIUM CHLORIDE 0.9% FLUSH
3.0000 mL | Freq: Two times a day (BID) | INTRAVENOUS | Status: DC
  Administered 2016-02-07 – 2016-02-10 (×5): 3 mL via INTRAVENOUS

## 2016-02-07 MED ORDER — FERROUS SULFATE 325 (65 FE) MG PO TABS
325.0000 mg | ORAL_TABLET | Freq: Every day | ORAL | Status: DC
  Administered 2016-02-07 – 2016-02-11 (×4): 325 mg via ORAL
  Filled 2016-02-07 (×5): qty 1

## 2016-02-07 MED ORDER — HYDROMORPHONE HCL 2 MG/ML IJ SOLN
2.0000 mg | INTRAMUSCULAR | Status: DC | PRN
  Administered 2016-02-07 – 2016-02-09 (×5): 2 mg via INTRAVENOUS
  Filled 2016-02-07 (×4): qty 1

## 2016-02-07 MED ORDER — LIDOCAINE 5 % EX PTCH
2.0000 | MEDICATED_PATCH | CUTANEOUS | Status: DC
  Administered 2016-02-07 – 2016-02-10 (×4): 2 via TRANSDERMAL
  Filled 2016-02-07 (×5): qty 2

## 2016-02-07 MED ORDER — FLUTICASONE PROPIONATE 50 MCG/ACT NA SUSP
2.0000 | Freq: Every day | NASAL | Status: DC
  Administered 2016-02-09 – 2016-02-11 (×2): 2 via NASAL
  Filled 2016-02-07: qty 16

## 2016-02-07 MED ORDER — SENNA 8.6 MG PO TABS
1.0000 | ORAL_TABLET | Freq: Two times a day (BID) | ORAL | Status: DC
  Administered 2016-02-07 – 2016-02-11 (×8): 8.6 mg via ORAL
  Filled 2016-02-07 (×9): qty 1

## 2016-02-07 MED ORDER — GABAPENTIN 300 MG PO CAPS
600.0000 mg | ORAL_CAPSULE | Freq: Every day | ORAL | Status: DC
  Administered 2016-02-07 – 2016-02-10 (×5): 600 mg via ORAL
  Filled 2016-02-07 (×6): qty 2

## 2016-02-07 MED ORDER — POLYETHYLENE GLYCOL 3350 17 G PO PACK
17.0000 g | PACK | Freq: Every day | ORAL | Status: DC
  Administered 2016-02-09 – 2016-02-11 (×3): 17 g via ORAL
  Filled 2016-02-07 (×3): qty 1

## 2016-02-07 MED ORDER — DEXTROSE 50 % IV SOLN
INTRAVENOUS | Status: AC
  Filled 2016-02-07: qty 50

## 2016-02-07 MED ORDER — HYDROMORPHONE HCL 2 MG/ML IJ SOLN: 3.0000 mg | Freq: Once | INTRAMUSCULAR | Status: AC

## 2016-02-07 MED ORDER — VITAMIN B-1 100 MG PO TABS
100.0000 mg | ORAL_TABLET | Freq: Every day | ORAL | Status: DC
  Administered 2016-02-07 – 2016-02-11 (×5): 100 mg via ORAL
  Filled 2016-02-07 (×5): qty 1

## 2016-02-07 MED ORDER — DEXTROSE 50 % IV SOLN
25.0000 mL | Freq: Once | INTRAVENOUS | Status: AC
  Administered 2016-02-07: 25 mL via INTRAVENOUS
  Filled 2016-02-07: qty 50

## 2016-02-07 MED ORDER — DEXTROSE 5 % IV SOLN: 500.0000 mg | INTRAVENOUS | Status: DC

## 2016-02-07 MED ORDER — SODIUM CHLORIDE 0.9 % IV SOLN
INTRAVENOUS | Status: DC
  Administered 2016-02-07: 02:00:00 via INTRAVENOUS

## 2016-02-07 MED ORDER — FLUOXETINE HCL 20 MG PO CAPS
20.0000 mg | ORAL_CAPSULE | Freq: Every day | ORAL | Status: DC
  Administered 2016-02-07 – 2016-02-11 (×5): 20 mg via ORAL
  Filled 2016-02-07 (×5): qty 1

## 2016-02-07 MED ORDER — SODIUM CHLORIDE 0.9 % IV SOLN
INTRAVENOUS | Status: DC
  Administered 2016-02-07: 1000 mL via INTRAVENOUS

## 2016-02-07 MED ORDER — DEXTROSE 50 % IV SOLN
INTRAVENOUS | Status: AC
  Administered 2016-02-07: 25 mL
  Filled 2016-02-07: qty 50

## 2016-02-07 MED ORDER — MAGNESIUM HYDROXIDE 400 MG/5ML PO SUSP: 30.0000 mL | Freq: Every day | ORAL | Status: DC | PRN

## 2016-02-07 MED ORDER — DEXTROSE 10 % IV SOLN
INTRAVENOUS | Status: DC
  Administered 2016-02-07: 500 mL via INTRAVENOUS
  Administered 2016-02-08 – 2016-02-10 (×4): via INTRAVENOUS
  Filled 2016-02-07 (×7): qty 1000

## 2016-02-07 MED ORDER — INSULIN ASPART 100 UNIT/ML ~~LOC~~ SOLN
0.0000 [IU] | SUBCUTANEOUS | Status: DC
  Administered 2016-02-10: 1 [IU] via SUBCUTANEOUS
  Administered 2016-02-10: 2 [IU] via SUBCUTANEOUS
  Administered 2016-02-10: 1 [IU] via SUBCUTANEOUS
  Administered 2016-02-11: 3 [IU] via SUBCUTANEOUS
  Administered 2016-02-11: 1 [IU] via SUBCUTANEOUS

## 2016-02-07 MED ORDER — FLEET ENEMA 7-19 GM/118ML RE ENEM: 1.0000 | ENEMA | Freq: Once | RECTAL | Status: DC | PRN

## 2016-02-07 MED ORDER — HYDROMORPHONE HCL 2 MG/ML IJ SOLN
1.5000 mg | INTRAMUSCULAR | Status: DC | PRN
  Administered 2016-02-07: 3 mg via INTRAVENOUS
  Filled 2016-02-07 (×2): qty 1

## 2016-02-07 MED ORDER — GABAPENTIN 300 MG PO CAPS
300.0000 mg | ORAL_CAPSULE | ORAL | Status: DC
  Administered 2016-02-07 – 2016-02-11 (×9): 300 mg via ORAL
  Filled 2016-02-07 (×8): qty 1

## 2016-02-07 MED ORDER — VANCOMYCIN HCL IN DEXTROSE 750-5 MG/150ML-% IV SOLN
750.0000 mg | Freq: Two times a day (BID) | INTRAVENOUS | Status: DC
  Administered 2016-02-07 (×2): 750 mg via INTRAVENOUS
  Filled 2016-02-07 (×3): qty 150

## 2016-02-07 MED ORDER — PIPERACILLIN-TAZOBACTAM 3.375 G IVPB
3.3750 g | Freq: Three times a day (TID) | INTRAVENOUS | Status: DC
  Administered 2016-02-07 – 2016-02-11 (×13): 3.375 g via INTRAVENOUS
  Filled 2016-02-07 (×14): qty 50

## 2016-02-07 MED ORDER — LEVOFLOXACIN IN D5W 750 MG/150ML IV SOLN
750.0000 mg | Freq: Every day | INTRAVENOUS | Status: DC
  Administered 2016-02-07 – 2016-02-08 (×3): 750 mg via INTRAVENOUS
  Filled 2016-02-07 (×3): qty 150

## 2016-02-07 MED ORDER — METHADONE HCL 5 MG PO TABS
25.0000 mg | ORAL_TABLET | Freq: Three times a day (TID) | ORAL | Status: DC
  Administered 2016-02-07 – 2016-02-11 (×14): 25 mg via ORAL
  Filled 2016-02-07 (×14): qty 5

## 2016-02-07 MED ORDER — HYDROMORPHONE HCL 2 MG/ML IJ SOLN
INTRAMUSCULAR | Status: AC
  Filled 2016-02-07: qty 1

## 2016-02-07 MED ORDER — SORBITOL 70 % SOLN
30.0000 mL | Freq: Every day | Status: DC | PRN
  Filled 2016-02-07: qty 30

## 2016-02-07 MED ORDER — ONDANSETRON HCL 4 MG PO TABS: 4.0000 mg | ORAL_TABLET | Freq: Four times a day (QID) | ORAL | Status: DC | PRN

## 2016-02-07 MED ORDER — SODIUM CHLORIDE 0.9 % IV SOLN
Freq: Once | INTRAVENOUS | Status: AC
  Administered 2016-02-07: 11:00:00 via INTRAVENOUS

## 2016-02-07 MED ORDER — SODIUM CHLORIDE 0.9 % IV BOLUS (SEPSIS): 1000.0000 mL | Freq: Once | INTRAVENOUS | Status: DC

## 2016-02-07 MED ORDER — ONDANSETRON HCL 4 MG/2ML IJ SOLN: 4.0000 mg | Freq: Four times a day (QID) | INTRAMUSCULAR | Status: DC | PRN

## 2016-02-07 MED ORDER — DEXTROSE-NACL 5-0.45 % IV SOLN
INTRAVENOUS | Status: DC
  Administered 2016-02-07: 13:00:00 via INTRAVENOUS

## 2016-02-07 NOTE — Progress Notes (Signed)
HEMATOLOGY-ONCOLOGY PROGRESS NOTE  SUBJECTIVE:45 year old BRCA negative with metastatic breast cancer and sarcoidosis, admitted to the hospital with right upper quadrant abdominal pain  And CT of the abdomen was performed that suggested thickened gallbladder wall with gallstones  And signs of acute cholecystitis. Patient was seen by surgery who felt that she was not a good candidate at this point because of pancytopenia.  And I was asked to see the patient to discuss the findings on the CT scan that appeared to suggest that she has widespread liver metastatic disease.  OBJECTIVE: REVIEW OF SYSTEMS:   Constitutional:fatigue but in no acute distress Eyes: Denies blurriness of vision Ears, nose, mouth, throat, and face: Denies mucositis or sore throat Respiratory: Denies cough, dyspnea or wheezes Cardiovascular: Denies palpitation, chest discomfort Gastrointestinal: severe right upper quadrant abdominal pain Skin: Denies abnormal skin rashes Neurological:recurrent headaches Behavioral/Psych: depressed Extremities: No lower extremity edema All other systems were reviewed with the patient and are negative.  I have reviewed the past medical history, past surgical history, social history and family history with the patient and they are unchanged from previous note.   PHYSICAL EXAMINATION: ECOG PERFORMANCE STATUS: 3 - Symptomatic, >50% confined to bed  Vitals:   02/07/16 0505 02/07/16 0800  BP: (!) 162/90   Pulse: 91   Resp: (!) 22   Temp:  97.7 F (36.5 C)   Filed Weights   02/06/16 1921 02/07/16 0500  Weight: 169 lb (76.7 kg) 173 lb 1 oz (78.5 kg)    GENERAL:alert, no distress and comfortable SKIN: skin color, texture, turgor are normal, no rashes or significant lesions EYES: normal, Conjunctiva are pink and non-injected, sclera clear OROPHARYNX:no exudate, no erythema and lips, buccal mucosa, and tongue normal  NECK: supple, thyroid normal size, non-tender, without  nodularity LYMPH:  no palpable lymphadenopathy in the cervical, axillary or inguinal LUNGS: clear to auscultation and percussion with normal breathing effort HEART: regular rate & rhythm and no murmurs and no lower extremity edema ABDOMEN:tenderness right upper quadrant Musculoskeletal:no cyanosis of digits and no clubbing  NEURO: alert & oriented x 3 with fluent speech, no focal motor/sensory deficits  LABORATORY DATA:  I have reviewed the data as listed CMP Latest Ref Rng & Units 02/07/2016 02/06/2016 02/02/2016  Glucose 65 - 99 mg/dL 88 114(H) 151(H)  BUN 6 - 20 mg/dL 21(H) 24(H) 21.2  Creatinine 0.44 - 1.00 mg/dL 1.37(H) 1.41(H) 1.7(H)  Sodium 135 - 145 mmol/L 141 138 137  Potassium 3.5 - 5.1 mmol/L 5.0 4.9 4.5  Chloride 101 - 111 mmol/L 112(H) 108 -  CO2 22 - 32 mmol/L 23 23 21(L)  Calcium 8.9 - 10.3 mg/dL 8.6(L) 9.3 9.4  Total Protein 6.5 - 8.1 g/dL 6.4(L) 6.9 6.7  Total Bilirubin 0.3 - 1.2 mg/dL 0.5 0.3 <0.30  Alkaline Phos 38 - 126 U/L 176(H) 201(H) 242(H)  AST 15 - 41 U/L 85(H) 93(H) 68(H)  ALT 14 - 54 U/L 48 54 38    Lab Results  Component Value Date   WBC 1.9 (L) 02/07/2016   HGB 6.9 (LL) 02/07/2016   HCT 21.2 (L) 02/07/2016   MCV 96.8 02/07/2016   PLT 74 (L) 02/07/2016   NEUTROABS 0.8 (L) 02/06/2016    ASSESSMENT AND PLAN: 1. Metastatic breast cancer: I reviewed the CT scan with the patient that showed extensive liver metastases. I reviewed Dr. Virgie Hall note from before. There was some question as to whether or not these are truly liver metastases or whether there may be hemangiomas. Given her  extensive bone metastatic disease in the nature of the liver findings on CT, I strongly suspect that these are true liver metastases. There is also some concern for small lung lesions which could be post infection/inflammation versus lymphangitic spread. The hilar and mediastinal adenopathy could be sarcoidosis. Patient is currently on Ibrance with letrozole.  2. I briefly  discussed with her about goals of care. Patient understands fully from Dr. Virgie Hall previous discussion with the goal of care is palliation and prolongation of life.  3. Patient wishes to continue to be aggressive in her approach to treatment given the fact that she is only 45 years old and is hopeful that there may be other treatments that could be given to help control her disease.  4. Acute calculus cholecystitis: Interventional radiology has been consulted for a percutaneous drain placement In the meantime we will hold off on Ibrance.  5. Severe anemia: Blood transfusion being given today. 6. Currently on Zosyn and vancomycin 7. Intense bone marrow abdominal pain:  Appears to be well controlled on the current pain regimen.  Dr. Jana Hall should be here tomorrow to discuss options with her.

## 2016-02-07 NOTE — Progress Notes (Signed)
Patient refused blood at this time, patient educated and  Informed that if she change her mind to let the RN know.

## 2016-02-07 NOTE — Progress Notes (Signed)
Pharmacy Antibiotic Note  Shelly Hall is a 45 y.o. female admitted on 02/06/2016 with pneumonia, intra-abdominal infection.  Pharmacy has been consulted for Vancomycin and Zosyn dosing.  Plan: Vancomycin 750mg  IV every 12 hours.  Goal trough 15-20 mcg/mL. Zosyn 3.375g IV q8h (4 hour infusion).  Height: 5\' 3"  (160 cm) Weight: 169 lb (76.7 kg) IBW/kg (Calculated) : 52.4  Temp (24hrs), Avg:98.7 F (37.1 C), Min:97.6 F (36.4 C), Max:99.7 F (37.6 C)   Recent Labs Lab 02/02/16 1053 02/02/16 1054 02/06/16 1914 02/06/16 2004 02/06/16 2219 02/07/16 0207 02/07/16 0402  WBC 1.9*  --  0.9*  --   --   --  1.9*  CREATININE  --  1.7* 1.41*  --   --   --  1.37*  LATICACIDVEN  --   --   --  2.96* 2.03* 2.1* 1.7    Estimated Creatinine Clearance: 50.3 mL/min (by C-G formula based on SCr of 1.37 mg/dL).    Allergies  Allergen Reactions  . Nyquil Multi-Symptom [Pseudoeph-Doxylamine-Dm-Apap] Itching and Other (See Comments)    Reaction:  Skin peeling on hands/feet     Antimicrobials this admission: Zosyn 9/3 >> Vancomycin 9/4 >> Levofloxacin 9/4 >>  Dose adjustments this admission: -  Microbiology results: pending  Thank you for allowing pharmacy to be a part of this patient's care.  Nani Skillern Crowford 02/07/2016 5:40 AM

## 2016-02-07 NOTE — Progress Notes (Signed)
MD notified of lactic acid of 2.1

## 2016-02-07 NOTE — Progress Notes (Signed)
Patient was originally refusing blood products from night shift RN. After speaking with Dr. Posey Pronto she is now agreeable to receive the unit of blood. Blood consent obtained and document placed in chart.

## 2016-02-07 NOTE — Progress Notes (Signed)
Subjective: Complains of pain.  Denies nausea/vomiting.    Objective: Vital signs in last 24 hours: Temp:  [97.6 F (36.4 C)-99.7 F (37.6 C)] 97.6 F (36.4 C) (09/04 0455) Pulse Rate:  [91-108] 91 (09/04 0505) Resp:  [16-24] 22 (09/04 0505) BP: (115-162)/(83-97) 162/90 (09/04 0505) SpO2:  [90 %-100 %] 100 % (09/04 0505) Weight:  [76.7 kg (169 lb)-78.5 kg (173 lb 1 oz)] 78.5 kg (173 lb 1 oz) (09/04 0500) Last BM Date: 02/06/16  Intake/Output from previous day: 09/03 0701 - 09/04 0700 In: 3601.3 [I.V.:3251.3; IV Piggyback:350] Out: -  Intake/Output this shift: No intake/output data recorded.  General appearance: mild distress and sleepy, but arousable and oriented Resp: breathing comfortably GI: soft, sl distended, tender RUQ  Lab Results:   Recent Labs  02/06/16 1914 02/07/16 0207 02/07/16 0402  WBC 0.9*  --  1.9*  HGB 14.4  --  6.9*  HCT 43.6  --  21.2*  PLT 51* 74* 74*   BMET  Recent Labs  02/06/16 1914 02/07/16 0402  NA 138 141  K 4.9 5.0  CL 108 112*  CO2 23 23  GLUCOSE 114* 88  BUN 24* 21*  CREATININE 1.41* 1.37*  CALCIUM 9.3 8.6*   PT/INR  Recent Labs  02/07/16 0207  LABPROT 13.2  INR 1.00   ABG No results for input(s): PHART, HCO3 in the last 72 hours.  Invalid input(s): PCO2, PO2  Studies/Results: Dg Chest 2 View  Result Date: 02/06/2016 CLINICAL DATA:  Productive cough and shortness of breath. History of metastatic breast cancer. EXAM: CHEST  2 VIEW COMPARISON:  PA and lateral chest 01/07/2016.  CT chest 01/17/2016. FINDINGS: Lung volumes are lower than on the prior chest film with associated basilar atelectasis. Peribronchial thickening is noted. No pneumothorax or pleural effusion. Heart size is normal. Surgical clips left axilla are noted. Extensive osseous sclerosis consistent with metastatic disease is seen. Remote fracture distal right clavicle is identified. IMPRESSION: Bronchitic change without focal process. Extensive osseous  metastatic disease. Electronically Signed   By: Inge Rise M.D.   On: 02/06/2016 18:13   Ct Angio Chest Pe W And/or Wo Contrast  Result Date: 02/06/2016 CLINICAL DATA:  45 year old female with history of breast cancer. Rule out PE. EXAM: CT ANGIOGRAPHY CHEST CT ABDOMEN AND PELVIS WITH CONTRAST TECHNIQUE: Multidetector CT imaging of the chest was performed using the standard protocol during bolus administration of intravenous contrast. Multiplanar CT image reconstructions and MIPs were obtained to evaluate the vascular anatomy. Multidetector CT imaging of the abdomen and pelvis was performed using the standard protocol during bolus administration of intravenous contrast. CONTRAST:  100 cc Isovue 370 COMPARISON:  Chest CT dated 01/17/2016, abdominal CT dated 08/19/2015 and bone scan dated 01/10/2016 FINDINGS: CTA CHEST FINDINGS There is shallow inspiration. Minimal bibasilar dependent atelectatic changes. Diffuse airspace ground-glass density, likely related to atelectatic changes and hypovolemia. There is apparent diffuse minimal interlobular septal thickening with Kerley B-lines, likely mild pulmonary edema. Small faint nodular densities along the bronchovascular interstitium predominantly in the right upper and middle lobes noted. Although these may be inflammatory/infectious in etiology, an early pulmonary metastases or lymphangitic spread of the tumor is not entirely excluded. Clinical correlation and follow-up recommended. There is no pleural effusion or pneumothorax. The central airways are patent. The thoracic aorta appears unremarkable. The origins of the great vessels of the aortic arch appear patent. There is no CT evidence of pulmonary embolism. There is mild cardiomegaly. No pericardial effusion. There are bilateral hilar adenopathy.  The largest lymph nodes measure up to 2.7 x 1.7 cm (previously 1.9 x 1.9 cm) in the right infrahilar region. Anterior mediastinal adenopathy in the prevascular space  noted similar to prior study. Right posterior mediastinal/subcarinal adenopathy measures 2.3 x 2.3 cm. The esophagus is grossly unremarkable. Left thyroidectomy. Left axillary surgical clips noted. No axillary adenopathy identified. Left-sided mastectomy. The chest wall soft tissues are otherwise unremarkable. There is extensive osseous metastatic disease. There is nondisplaced fracture of the right posterior fourth and tenth ribs as seen on the prior CT, likely pathologic fracture. No new fracture. T1 and T8 compression fracture deformity similar to prior study. CT ABDOMEN and PELVIS FINDINGS No intra-abdominal free air or free fluid. There are extensive hepatic hypodense lesions compatible with metastatic disease. There is associated irregular appearance of the hepatic contour. There multiple small stones within the gallbladder. There is thickened appearance of the gallbladder wall. Ultrasound may provide better evaluation of the gallbladder if an acute cholecystitis is clinically suspected. The pancreas, spleen, adrenal glands, kidneys, visualized ureters, and urinary bladder appear unremarkable. Hysterectomy. There is moderate stool throughout the colon. No evidence of bowel obstruction or active inflammation. Normal appendix. There is mild aortoiliac atherosclerotic disease. The origins of the celiac axis, SMA, IMA as well as the origins of the renal arteries appear patent. The the IVC, SMV, splenic vein, and main portal vein are patent. No portal venous gas identified. Upper abdominal adenopathy in the gastrohepatic space, and portacaval adenopathy. The abdominal wall soft tissues appear unremarkable. There is extensive osseous metastatic disease. There is pathologic compression fracture of the L5 vertebra with more than 50% loss of vertebral body height. There is retropulsed fracture fragment from the superior posterior corner of the L5 vertebral with moderate focal narrowing of the central canal. There is  disc desiccation with vacuum phenomena at L4-5 and L5-S1. Overall there has been interval progression of osseous metastatic disease compared to the study dated 08/19/2015. Review of the MIP images confirms the above findings. IMPRESSION: No CT evidence of pulmonary embolism. Mild atelectatic changes with probable mild pulmonary edema. Small nodular densities predominantly in the right upper and middle lobes may be inflammatory or infectious in etiology. Metastatic disease or less likely early lymphangitic spread of tumor are not excluded. Clinical correlation and follow-up recommended. Interval progression of hilar and mediastinal adenopathy. Extensive hepatic metastatic disease. Small gallstones with diffuse gallbladder wall thickening. Ultrasound may provide better evaluation of the gallbladder if an acute cholecystitis is clinically suspected. Upper abdominal adenopathy. Extensive osseous metastatic disease with overall progression since the prior study. Multilevel compression deformities. L5 pathologic compression fracture, new from study dated 08/19/2015 with associated retropulsed fragment and focal narrowing of the central canal. Electronically Signed   By: Anner Crete M.D.   On: 02/06/2016 22:34   Ct Abdomen Pelvis W Contrast  Result Date: 02/06/2016 CLINICAL DATA:  45 year old female with history of breast cancer. Rule out PE. EXAM: CT ANGIOGRAPHY CHEST CT ABDOMEN AND PELVIS WITH CONTRAST TECHNIQUE: Multidetector CT imaging of the chest was performed using the standard protocol during bolus administration of intravenous contrast. Multiplanar CT image reconstructions and MIPs were obtained to evaluate the vascular anatomy. Multidetector CT imaging of the abdomen and pelvis was performed using the standard protocol during bolus administration of intravenous contrast. CONTRAST:  100 cc Isovue 370 COMPARISON:  Chest CT dated 01/17/2016, abdominal CT dated 08/19/2015 and bone scan dated 01/10/2016  FINDINGS: CTA CHEST FINDINGS There is shallow inspiration. Minimal bibasilar dependent atelectatic changes. Diffuse  airspace ground-glass density, likely related to atelectatic changes and hypovolemia. There is apparent diffuse minimal interlobular septal thickening with Kerley B-lines, likely mild pulmonary edema. Small faint nodular densities along the bronchovascular interstitium predominantly in the right upper and middle lobes noted. Although these may be inflammatory/infectious in etiology, an early pulmonary metastases or lymphangitic spread of the tumor is not entirely excluded. Clinical correlation and follow-up recommended. There is no pleural effusion or pneumothorax. The central airways are patent. The thoracic aorta appears unremarkable. The origins of the great vessels of the aortic arch appear patent. There is no CT evidence of pulmonary embolism. There is mild cardiomegaly. No pericardial effusion. There are bilateral hilar adenopathy. The largest lymph nodes measure up to 2.7 x 1.7 cm (previously 1.9 x 1.9 cm) in the right infrahilar region. Anterior mediastinal adenopathy in the prevascular space noted similar to prior study. Right posterior mediastinal/subcarinal adenopathy measures 2.3 x 2.3 cm. The esophagus is grossly unremarkable. Left thyroidectomy. Left axillary surgical clips noted. No axillary adenopathy identified. Left-sided mastectomy. The chest wall soft tissues are otherwise unremarkable. There is extensive osseous metastatic disease. There is nondisplaced fracture of the right posterior fourth and tenth ribs as seen on the prior CT, likely pathologic fracture. No new fracture. T1 and T8 compression fracture deformity similar to prior study. CT ABDOMEN and PELVIS FINDINGS No intra-abdominal free air or free fluid. There are extensive hepatic hypodense lesions compatible with metastatic disease. There is associated irregular appearance of the hepatic contour. There multiple small  stones within the gallbladder. There is thickened appearance of the gallbladder wall. Ultrasound may provide better evaluation of the gallbladder if an acute cholecystitis is clinically suspected. The pancreas, spleen, adrenal glands, kidneys, visualized ureters, and urinary bladder appear unremarkable. Hysterectomy. There is moderate stool throughout the colon. No evidence of bowel obstruction or active inflammation. Normal appendix. There is mild aortoiliac atherosclerotic disease. The origins of the celiac axis, SMA, IMA as well as the origins of the renal arteries appear patent. The the IVC, SMV, splenic vein, and main portal vein are patent. No portal venous gas identified. Upper abdominal adenopathy in the gastrohepatic space, and portacaval adenopathy. The abdominal wall soft tissues appear unremarkable. There is extensive osseous metastatic disease. There is pathologic compression fracture of the L5 vertebra with more than 50% loss of vertebral body height. There is retropulsed fracture fragment from the superior posterior corner of the L5 vertebral with moderate focal narrowing of the central canal. There is disc desiccation with vacuum phenomena at L4-5 and L5-S1. Overall there has been interval progression of osseous metastatic disease compared to the study dated 08/19/2015. Review of the MIP images confirms the above findings. IMPRESSION: No CT evidence of pulmonary embolism. Mild atelectatic changes with probable mild pulmonary edema. Small nodular densities predominantly in the right upper and middle lobes may be inflammatory or infectious in etiology. Metastatic disease or less likely early lymphangitic spread of tumor are not excluded. Clinical correlation and follow-up recommended. Interval progression of hilar and mediastinal adenopathy. Extensive hepatic metastatic disease. Small gallstones with diffuse gallbladder wall thickening. Ultrasound may provide better evaluation of the gallbladder if an  acute cholecystitis is clinically suspected. Upper abdominal adenopathy. Extensive osseous metastatic disease with overall progression since the prior study. Multilevel compression deformities. L5 pathologic compression fracture, new from study dated 08/19/2015 with associated retropulsed fragment and focal narrowing of the central canal. Electronically Signed   By: Anner Crete M.D.   On: 02/06/2016 22:34   US Abdomen Limited  Ruq  Result Date: 02/06/2016 CLINICAL DATA:  RIGHT upper quadrant pain for 3 days, history hypertension, sarcoidosis, type II diabetes mellitus, thyroid cancer, breast cancer EXAM: US ABDOMEN LIMITED - RIGHT UPPER QUADRANT COMPARISON:  CT abdomen pelvis 02/06/2016 FINDINGS: Gallbladder: Thickened gallbladder wall. Dependent shadowing calculi. Small gallbladder polyps noted. Sonographic Murphy sign present. No definite pericholecystic fluid. Common bile duct: Diameter: 6 mm diameter, upper normal Liver: Markedly heterogeneous appearance of liver parenchyma with multiple rounded areas of hypo echogenicity compatible with widespread hepatic metastases as noted on earlier CT. Hepatopetal portal venous flow. No RIGHT upper quadrant free fluid. IMPRESSION: Thickened gallbladder wall with gallstones, gallbladder polyps, and sonographic Murphy sign suspicious for acute cholecystitis. Widespread hepatic metastatic disease. Electronically Signed   By: Lavonia Dana M.D.   On: 02/06/2016 22:50    Anti-infectives: Anti-infectives    Start     Dose/Rate Route Frequency Ordered Stop   02/07/16 0600  piperacillin-tazobactam (ZOSYN) IVPB 3.375 g     3.375 g 12.5 mL/hr over 240 Minutes Intravenous Every 8 hours 02/07/16 0536     02/07/16 0130  levofloxacin (LEVAQUIN) IVPB 750 mg     750 mg 100 mL/hr over 90 Minutes Intravenous Daily at bedtime 02/07/16 0112     02/07/16 0130  vancomycin (VANCOCIN) IVPB 750 mg/150 ml premix     750 mg 150 mL/hr over 60 Minutes Intravenous 2 times daily  02/07/16 0129     02/07/16 0115  azithromycin (ZITHROMAX) 500 mg in dextrose 5 % 250 mL IVPB  Status:  Discontinued     500 mg 250 mL/hr over 60 Minutes Intravenous Every 24 hours 02/07/16 0106 02/07/16 0108   02/06/16 2115  piperacillin-tazobactam (ZOSYN) IVPB 3.375 g     3.375 g 100 mL/hr over 30 Minutes Intravenous  Once 02/06/16 2103 02/06/16 2208      Assessment/Plan: s/p * No surgery found * Acute calculous cholecystitis.  Too sick currently for surgery given thrombocytopenia and neutropenia. Recommend perc cholecystostomy tube.   LOS: 0 days    Shelly Hall 02/07/2016

## 2016-02-07 NOTE — H&P (Signed)
History and Physical    NAKENDRA CRISTOFARO [id number] DOB: 10-23-1970 DOA: 02/06/2016  PCP: Elyn Peers, MD   Patient coming from: Home   Chief Complaint: RUQ pain, dyspnea, fatigue   HPI: Shelly Hall is a 45 y.o. 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 Patient is currently under the care of oncology for treatment of her metastatic breast cancer. She was recently admitted for pain control and was doing somewhat better back at home when she noted the insidious development of lethargy, worsening dyspnea, and right upper quadrant abdominal pain approximately 3 days ago. She reports the pain is severe, worse with deep inspiration or cough, and localized to the right upper quadrant. She is unable to say whether a meal worsens this she has not had an appetite during this illness. She has been taking 25 mg of methadone 3 times daily, in addition to 4 mg oral Dilaudid every 4 hours at home. Despite these measures, pain became severe and unmanageable. She presents for evaluation of this.   ED Course: Upon arrival to the ED, patient is found to be afebrile, saturating 90% on room air, tachycardic in the low 100s, and with vitals otherwise stable. EKG demonstrates sinus tachycardia with rate 106 and LVH by voltage criteria. Chest x-ray is notable for bronchitic changes without a focal process, and also notable for diffuse osseous metastases. CMP features a serum creatinine 1.41, up from an apparent baseline of 0.8-0.9, elevation in alkaline phosphatase to 200, and AST of 93. CBC is notable for a leukopenia with WBC 900, and thrombocytopenia with platelet count of 51,000. Lactic acid is elevated to a value of 2.96, and came down to 2.03 after fluids. Troponin is  reassuring at 0.01 and urinalysis is unremarkable. CTA PE study is negative for PE but notable for metastases versus infection in RUL lung and interval progression of mediastinal adenopathy and bony metastases with a new pathologic fracture at L5. A right upper quadrant ultrasound was obtained and notable for thickened gallbladder wall with gallstones, polyps, and sonographic Murphy sign concerning for acute cholecystitis. Also noted on the right upper quadrant ultrasound was widespread metastatic liver disease, which appears to be a new finding. General surgery was consulted by the ED physician and evaluated the patient here in the ED in regards to possible acute cholecystitis. Patient is not considered to be a surgical candidate given the profound leukopenia and thrombocytopenia, but she may benefit from management with the percutaneous drain. She has remained hemodynamically stable in the ED and in no acute respiratory distress. She'll be admitted to the stepdown unit for ongoing evaluation and management of severe sepsis, possibly secondary to acute cholecystitis.  Review of Systems:  All other systems reviewed and apart from HPI, are negative.  Past Medical History:  Diagnosis Date  . Anemia   . Anxiety    no meds currently  . Breast cancer ([place]) 2015   left upper outer;  had chemo  . Bronchitis 2014  . Complication of anesthesia    makes pt. restless and unable to be still  . Depression    no meds currently  . GERD (gastroesophageal reflux disease)   . Heart murmur    never had any problems as adult  . High cholesterol   . Hypertension   .  Radiation 06/23/14-08/11/14   Invasive ductal ca o left breast  . Sarcoidosis ([place])    no problems per patient   . SVD (spontaneous vaginal delivery)    x 2  . Thyroid cancer ([place]) 2015  . Type II diabetes mellitus ([place])     Past Surgical History:  Procedure Laterality Date  . ABDOMINAL HYSTERECTOMY    . BREAST BIOPSY Left 2015 X 3   biopsy    . ENDOBRONCHIAL ULTRASOUND Bilateral 12/02/2012   Procedure: ENDOBRONCHIAL ULTRASOUND;  Surgeon: Collene Gobble, MD;  Location: [facility];  Service: Cardiopulmonary;  Laterality: Bilateral;  . LAPAROSCOPIC ASSISTED VAGINAL HYSTERECTOMY N/A 12/09/2014   Procedure: LAPAROSCOPIC ASSISTED VAGINAL HYSTERECTOMY;  Surgeon: Cheri Fowler, MD;  Location: [facility];  Service: Gynecology;  Laterality: N/A;  . LEEP    . LUNG BIOPSY  2014  . MASTECTOMY W/ SENTINEL NODE BIOPSY Left 04/23/2014   & axillary LND  . MASTECTOMY W/ SENTINEL NODE BIOPSY Left 04/23/2014   Procedure: LEFT BREAST MASTECTOMY WITH SENTINEL LYMPH NODE BIOPSY AND AXILLARY LYMPH NODE DISECTION;  Surgeon: [name], MD;  Location: [facility];  Service: General;  Laterality: Left;  . PORT-A-CATH REMOVAL Left 02/03/2015   Procedure: REMOVAL PORT-A-CATH;  Surgeon: [name], MD;  Location: [facility];  Service: General;  Laterality: Left;  . PORTACATH PLACEMENT N/A 10/22/2013   Procedure: INSERTION PORT-A-CATH;  Surgeon: [name], MD;  Location: [facility];  Service: General;  Laterality: N/A;  . SALPINGOOPHORECTOMY Bilateral 12/09/2014   Procedure: SALPINGO OOPHORECTOMY;  Surgeon: Cheri Fowler, MD;  Location: [facility];  Service: Gynecology;  Laterality: Bilateral;  . THYROID LOBECTOMY Left 04/23/2014   Procedure: LEFT THYROID LOBECTOMY;  Surgeon: [name], MD;  Location: [facility];  Service: General;  Laterality: Left;  . TUBAL LIGATION  2001  . WISDOM TOOTH EXTRACTION       reports that she has been smoking Cigarettes.  She has a 6.25 pack-year smoking history. She has never used smokeless tobacco. She reports that she drinks about 6.0 oz of alcohol per week . She reports that she does not use drugs.  Allergies  Allergen Reactions  . Nyquil Multi-Symptom [Pseudoeph-Doxylamine-Dm-Apap] Itching and Other (See Comments)    Reaction:  Skin peeling on hands/feet     Family History  Problem Relation Age of Onset  . Cancer  Paternal Aunt     "bone cancer"; deceased 20s  . Cancer Paternal Uncle     stomach cancer; deceased 65s  . Cancer Paternal Aunt     unk. primary; currently 55s  . Asthma Mother   . Prostate cancer Maternal Grandfather 82     Prior to Admission medications   Medication Sig Start Date End Date Taking? Authorizing Provider  amphetamine-dextroamphetamine (ADDERALL) 10 MG tablet Take 1 tablet (10 mg total) by mouth 2 (two) times daily with a meal. 01/18/16  Yes Mariel Aloe, MD  carvedilol (COREG) 12.5 MG tablet Take 1 tablet (12.5 mg total) by mouth 2 (two) times daily with a meal. 01/18/16  Yes Mariel Aloe, MD  ferrous sulfate 325 (65 FE) MG tablet Take 325 mg by mouth daily with breakfast.    Yes Historical Provider, MD  FLUoxetine (PROZAC) 20 MG tablet Take 20 mg by mouth daily.    Yes Historical Provider, MD  gabapentin (NEURONTIN) 300 MG capsule Take 300-600 mg by mouth 3 (three) times daily. Pt takes one capsule in the morning, one in the afternoon, and two at bedtime.  Yes Historical Provider, MD  HYDROmorphone (DILAUDID) 4 MG tablet Take 1 tablet (4 mg total) by mouth every 2 (two) hours as needed for severe pain. 01/28/16  Yes Chauncey Cruel, MD  letrozole [facility]) 2.5 MG tablet Take 1 tablet (2.5 mg total) by mouth daily. 08/06/15  Yes Chauncey Cruel, MD  lidocaine (LIDODERM) 5 % Place 2 patches onto the skin daily. Remove & Discard patch within 12 hours or as directed by MD 01/18/16  Yes Mariel Aloe, MD  loratadine-pseudoephedrine (CLARITIN-D 24-HOUR) 10-240 MG 24 hr tablet Take 1 tablet by mouth daily as needed for allergies.    Yes Historical Provider, MD  metFORMIN (GLUCOPHAGE) 1000 MG tablet Take 1,000 mg by mouth 2 (two) times daily with a meal.    Yes Historical Provider, MD  methadone (DOLOPHINE) 5 MG tablet Take 5 tablets (25 mg total) by mouth every 8 (eight) hours. 01/28/16  Yes Chauncey Cruel, MD  mometasone (NASONEX) 50 MCG/ACT nasal spray Place 1 spray into the  nose 2 (two) times daily.   Yes Historical Provider, MD  omeprazole (PRILOSEC) 40 MG capsule Take 1 capsule (40 mg total) by mouth daily. 10/08/15  Yes Chauncey Cruel, MD  palbociclib [name]) 100 MG capsule Take 1 capsule (100 mg total) by mouth daily with breakfast. Take whole with food. 10/08/15  Yes Chauncey Cruel, MD  polyethylene glycol (MIRALAX / GLYCOLAX) packet Take 17 g by mouth daily.   Yes Historical Provider, MD  prochlorperazine (COMPAZINE) 10 MG tablet Take 1 tablet (10 mg total) by mouth every 6 (six) hours as needed for nausea or vomiting. 12/03/15  Yes Chauncey Cruel, MD  senna (SENOKOT) 8.6 MG TABS tablet Take 1 tablet (8.6 mg total) by mouth 2 (two) times daily. 01/18/16  Yes Mariel Aloe, MD  thiamine 100 MG tablet Take 1 tablet (100 mg total) by mouth daily. 01/18/16  Yes Mariel Aloe, MD    Physical Exam: Vitals:   02/06/16 2200 02/06/16 2201 02/06/16 2300 02/07/16 0028  BP: 153/92 142/97 150/93 154/96  Pulse: 100 98 94 97  Resp: 23 24 23 22   Temp:      TempSrc:      SpO2: 98% 99% 100% 98%  Weight:      Height:          Constitutional: NAD, calm, in apparent discomfort Eyes: PERTLA, lids and conjunctivae normal ENMT: Mucous membranes are dry. Posterior pharynx clear of any exudate or lesions.   Neck: normal, supple, no masses, no thyromegaly Respiratory: Scattered rhonchi and wheezes b/l. Normal respiratory effort.    Cardiovascular: S1 & S2 heard, regular rate and rhythm. Hyperdynamic precordium. No extremity edema. No significant JVD. Abdomen: No distension, tender in RUQ with rebound pain, but no guarding; no masses palpated. Bowel sounds normal.  Musculoskeletal: no clubbing / cyanosis. No joint deformity upper and lower extremities. Normal muscle tone.  Skin: no significant rashes, lesions, ulcers. Warm, dry, well-perfused. Neurologic: CN 2-12 grossly intact. Sensation intact, DTR normal. Strength 5/5 in all 4 limbs.  Psychiatric: Normal judgment  and insight. Alert and oriented x 3. Normal mood and affect.     Labs on Admission: I have personally reviewed following labs and imaging studies  CBC:  Recent Labs Lab 02/02/16 1053 02/06/16 1914  WBC 1.9* 0.9*  NEUTROABS 1.5 0.8*  HGB 7.9* 14.4  HCT 24.8* 43.6  MCV 99.1 96.5  PLT 104* 51*   Basic Metabolic Panel:  Recent Labs Lab  02/02/16 1054 02/06/16 1914  NA 137 138  K 4.5 4.9  CL  --  108  CO2 21* 23  GLUCOSE 151* 114*  BUN 21.2 24*  CREATININE 1.7* 1.41*  CALCIUM 9.4 9.3   GFR: Estimated Creatinine Clearance: 48.9 mL/min (by C-G formula based on SCr of 1.41 mg/dL). Liver Function Tests:  Recent Labs Lab 02/02/16 1054 02/06/16 1914  AST 68* 93*  ALT 38 54  ALKPHOS 242* 201*  BILITOT <0.30 0.3  PROT 6.7 6.9  ALBUMIN 2.3* 2.9*    Recent Labs Lab 02/06/16 1914  LIPASE 65*   No results for input(s): AMMONIA in the last 168 hours. Coagulation Profile: No results for input(s): INR, PROTIME in the last 168 hours. Cardiac Enzymes: No results for input(s): CKTOTAL, CKMB, CKMBINDEX, TROPONINI in the last 168 hours. BNP (last 3 results) No results for input(s): PROBNP in the last 8760 hours. HbA1C: No results for input(s): HGBA1C in the last 72 hours. CBG: No results for input(s): GLUCAP in the last 168 hours. Lipid Profile: No results for input(s): CHOL, HDL, LDLCALC, TRIG, CHOLHDL, LDLDIRECT in the last 72 hours. Thyroid Function Tests: No results for input(s): TSH, T4TOTAL, FREET4, T3FREE, THYROIDAB in the last 72 hours. Anemia Panel: No results for input(s): VITAMINB12, FOLATE, FERRITIN, TIBC, IRON, RETICCTPCT in the last 72 hours. Urine analysis:    Component Value Date/Time   COLORURINE YELLOW 02/06/2016 1923   APPEARANCEUR CLEAR 02/06/2016 1923   LABSPEC 1.010 02/06/2016 1923   PHURINE 5.5 02/06/2016 1923   GLUCOSEU NEGATIVE 02/06/2016 1923   HGBUR NEGATIVE 02/06/2016 1923   BILIRUBINUR NEGATIVE 02/06/2016 1923   KETONESUR NEGATIVE  02/06/2016 1923   PROTEINUR NEGATIVE 02/06/2016 1923   UROBILINOGEN 0.2 01/04/2012 0246   NITRITE NEGATIVE 02/06/2016 1923   LEUKOCYTESUR NEGATIVE 02/06/2016 1923   Sepsis Labs: @LABRCNTIP (procalcitonin:4,lacticidven:4) )No results found for this or any previous visit (from the past 240 hour(s)).   Radiological Exams on Admission: Dg Chest 2 View  Result Date: 02/06/2016 CLINICAL DATA:  Productive cough and shortness of breath. History of metastatic breast cancer. EXAM: CHEST  2 VIEW COMPARISON:  PA and lateral chest 01/07/2016.  CT chest 01/17/2016. FINDINGS: Lung volumes are lower than on the prior chest film with associated basilar atelectasis. Peribronchial thickening is noted. No pneumothorax or pleural effusion. Heart size is normal. Surgical clips left axilla are noted. Extensive osseous sclerosis consistent with metastatic disease is seen. Remote fracture distal right clavicle is identified. IMPRESSION: Bronchitic change without focal process. Extensive osseous metastatic disease. Electronically Signed   By: Inge Rise M.D.   On: 02/06/2016 18:13   Ct Angio Chest Pe W And/or Wo Contrast  Result Date: 02/06/2016 CLINICAL DATA:  45 year old female with history of breast cancer. Rule out PE. EXAM: CT ANGIOGRAPHY CHEST CT ABDOMEN AND PELVIS WITH CONTRAST TECHNIQUE: Multidetector CT imaging of the chest was performed using the standard protocol during bolus administration of intravenous contrast. Multiplanar CT image reconstructions and MIPs were obtained to evaluate the vascular anatomy. Multidetector CT imaging of the abdomen and pelvis was performed using the standard protocol during bolus administration of intravenous contrast. CONTRAST:  100 cc Isovue 370 COMPARISON:  Chest CT dated 01/17/2016, abdominal CT dated 08/19/2015 and bone scan dated 01/10/2016 FINDINGS: CTA CHEST FINDINGS There is shallow inspiration. Minimal bibasilar dependent atelectatic changes. Diffuse airspace  ground-glass density, likely related to atelectatic changes and hypovolemia. There is apparent diffuse minimal interlobular septal thickening with Kerley B-lines, likely mild pulmonary edema. Small faint nodular densities  along the bronchovascular interstitium predominantly in the right upper and middle lobes noted. Although these may be inflammatory/infectious in etiology, an early pulmonary metastases or lymphangitic spread of the tumor is not entirely excluded. Clinical correlation and follow-up recommended. There is no pleural effusion or pneumothorax. The central airways are patent. The thoracic aorta appears unremarkable. The origins of the great vessels of the aortic arch appear patent. There is no CT evidence of pulmonary embolism. There is mild cardiomegaly. No pericardial effusion. There are bilateral hilar adenopathy. The largest lymph nodes measure up to 2.7 x 1.7 cm (previously 1.9 x 1.9 cm) in the right infrahilar region. Anterior mediastinal adenopathy in the prevascular space noted similar to prior study. Right posterior mediastinal/subcarinal adenopathy measures 2.3 x 2.3 cm. The esophagus is grossly unremarkable. Left thyroidectomy. Left axillary surgical clips noted. No axillary adenopathy identified. Left-sided mastectomy. The chest wall soft tissues are otherwise unremarkable. There is extensive osseous metastatic disease. There is nondisplaced fracture of the right posterior fourth and tenth ribs as seen on the prior CT, likely pathologic fracture. No new fracture. T1 and T8 compression fracture deformity similar to prior study. CT ABDOMEN and PELVIS FINDINGS No intra-abdominal free air or free fluid. There are extensive hepatic hypodense lesions compatible with metastatic disease. There is associated irregular appearance of the hepatic contour. There multiple small stones within the gallbladder. There is thickened appearance of the gallbladder wall. Ultrasound may provide better evaluation of  the gallbladder if an acute cholecystitis is clinically suspected. The pancreas, spleen, adrenal glands, kidneys, visualized ureters, and urinary bladder appear unremarkable. Hysterectomy. There is moderate stool throughout the colon. No evidence of bowel obstruction or active inflammation. Normal appendix. There is mild aortoiliac atherosclerotic disease. The origins of the celiac axis, SMA, IMA as well as the origins of the renal arteries appear patent. The the IVC, SMV, splenic vein, and main portal vein are patent. No portal venous gas identified. Upper abdominal adenopathy in the gastrohepatic space, and portacaval adenopathy. The abdominal wall soft tissues appear unremarkable. There is extensive osseous metastatic disease. There is pathologic compression fracture of the L5 vertebra with more than 50% loss of vertebral body height. There is retropulsed fracture fragment from the superior posterior corner of the L5 vertebral with moderate focal narrowing of the central canal. There is disc desiccation with vacuum phenomena at L4-5 and L5-S1. Overall there has been interval progression of osseous metastatic disease compared to the study dated 08/19/2015. Review of the MIP images confirms the above findings. IMPRESSION: No CT evidence of pulmonary embolism. Mild atelectatic changes with probable mild pulmonary edema. Small nodular densities predominantly in the right upper and middle lobes may be inflammatory or infectious in etiology. Metastatic disease or less likely early lymphangitic spread of tumor are not excluded. Clinical correlation and follow-up recommended. Interval progression of hilar and mediastinal adenopathy. Extensive hepatic metastatic disease. Small gallstones with diffuse gallbladder wall thickening. Ultrasound may provide better evaluation of the gallbladder if an acute cholecystitis is clinically suspected. Upper abdominal adenopathy. Extensive osseous metastatic disease with overall  progression since the prior study. Multilevel compression deformities. L5 pathologic compression fracture, new from study dated 08/19/2015 with associated retropulsed fragment and focal narrowing of the central canal. Electronically Signed   By: Anner Crete M.D.   On: 02/06/2016 22:34   Ct Abdomen Pelvis W Contrast  Result Date: 02/06/2016 CLINICAL DATA:  45 year old female with history of breast cancer. Rule out PE. EXAM: CT ANGIOGRAPHY CHEST CT ABDOMEN AND PELVIS WITH CONTRAST  TECHNIQUE: Multidetector CT imaging of the chest was performed using the standard protocol during bolus administration of intravenous contrast. Multiplanar CT image reconstructions and MIPs were obtained to evaluate the vascular anatomy. Multidetector CT imaging of the abdomen and pelvis was performed using the standard protocol during bolus administration of intravenous contrast. CONTRAST:  100 cc Isovue 370 COMPARISON:  Chest CT dated 01/17/2016, abdominal CT dated 08/19/2015 and bone scan dated 01/10/2016 FINDINGS: CTA CHEST FINDINGS There is shallow inspiration. Minimal bibasilar dependent atelectatic changes. Diffuse airspace ground-glass density, likely related to atelectatic changes and hypovolemia. There is apparent diffuse minimal interlobular septal thickening with Kerley B-lines, likely mild pulmonary edema. Small faint nodular densities along the bronchovascular interstitium predominantly in the right upper and middle lobes noted. Although these may be inflammatory/infectious in etiology, an early pulmonary metastases or lymphangitic spread of the tumor is not entirely excluded. Clinical correlation and follow-up recommended. There is no pleural effusion or pneumothorax. The central airways are patent. The thoracic aorta appears unremarkable. The origins of the great vessels of the aortic arch appear patent. There is no CT evidence of pulmonary embolism. There is mild cardiomegaly. No pericardial effusion. There are  bilateral hilar adenopathy. The largest lymph nodes measure up to 2.7 x 1.7 cm (previously 1.9 x 1.9 cm) in the right infrahilar region. Anterior mediastinal adenopathy in the prevascular space noted similar to prior study. Right posterior mediastinal/subcarinal adenopathy measures 2.3 x 2.3 cm. The esophagus is grossly unremarkable. Left thyroidectomy. Left axillary surgical clips noted. No axillary adenopathy identified. Left-sided mastectomy. The chest wall soft tissues are otherwise unremarkable. There is extensive osseous metastatic disease. There is nondisplaced fracture of the right posterior fourth and tenth ribs as seen on the prior CT, likely pathologic fracture. No new fracture. T1 and T8 compression fracture deformity similar to prior study. CT ABDOMEN and PELVIS FINDINGS No intra-abdominal free air or free fluid. There are extensive hepatic hypodense lesions compatible with metastatic disease. There is associated irregular appearance of the hepatic contour. There multiple small stones within the gallbladder. There is thickened appearance of the gallbladder wall. Ultrasound may provide better evaluation of the gallbladder if an acute cholecystitis is clinically suspected. The pancreas, spleen, adrenal glands, kidneys, visualized ureters, and urinary bladder appear unremarkable. Hysterectomy. There is moderate stool throughout the colon. No evidence of bowel obstruction or active inflammation. Normal appendix. There is mild aortoiliac atherosclerotic disease. The origins of the celiac axis, SMA, IMA as well as the origins of the renal arteries appear patent. The the IVC, SMV, splenic vein, and main portal vein are patent. No portal venous gas identified. Upper abdominal adenopathy in the gastrohepatic space, and portacaval adenopathy. The abdominal wall soft tissues appear unremarkable. There is extensive osseous metastatic disease. There is pathologic compression fracture of the L5 vertebra with more  than 50% loss of vertebral body height. There is retropulsed fracture fragment from the superior posterior corner of the L5 vertebral with moderate focal narrowing of the central canal. There is disc desiccation with vacuum phenomena at L4-5 and L5-S1. Overall there has been interval progression of osseous metastatic disease compared to the study dated 08/19/2015. Review of the MIP images confirms the above findings. IMPRESSION: No CT evidence of pulmonary embolism. Mild atelectatic changes with probable mild pulmonary edema. Small nodular densities predominantly in the right upper and middle lobes may be inflammatory or infectious in etiology. Metastatic disease or less likely early lymphangitic spread of tumor are not excluded. Clinical correlation and follow-up recommended. Interval progression  of hilar and mediastinal adenopathy. Extensive hepatic metastatic disease. Small gallstones with diffuse gallbladder wall thickening. Ultrasound may provide better evaluation of the gallbladder if an acute cholecystitis is clinically suspected. Upper abdominal adenopathy. Extensive osseous metastatic disease with overall progression since the prior study. Multilevel compression deformities. L5 pathologic compression fracture, new from study dated 08/19/2015 with associated retropulsed fragment and focal narrowing of the central canal. Electronically Signed   By: Anner Crete M.D.   On: 02/06/2016 22:34   US Abdomen Limited Ruq  Result Date: 02/06/2016 CLINICAL DATA:  RIGHT upper quadrant pain for 3 days, history hypertension, sarcoidosis, type II diabetes mellitus, thyroid cancer, breast cancer EXAM: US ABDOMEN LIMITED - RIGHT UPPER QUADRANT COMPARISON:  CT abdomen pelvis 02/06/2016 FINDINGS: Gallbladder: Thickened gallbladder wall. Dependent shadowing calculi. Small gallbladder polyps noted. Sonographic Murphy sign present. No definite pericholecystic fluid. Common bile duct: Diameter: 6 mm diameter, upper normal  Liver: Markedly heterogeneous appearance of liver parenchyma with multiple rounded areas of hypo echogenicity compatible with widespread hepatic metastases as noted on earlier CT. Hepatopetal portal venous flow. No RIGHT upper quadrant free fluid. IMPRESSION: Thickened gallbladder wall with gallstones, gallbladder polyps, and sonographic Murphy sign suspicious for acute cholecystitis. Widespread hepatic metastatic disease. Electronically Signed   By: Lavonia Dana M.D.   On: 02/06/2016 22:50    EKG: Independently reviewed. Sinus tachycardia (rate 106), LVH by voltage criteria   Assessment/Plan  1. Severe sepsis, suspected secondary to acute cholecystitis   - Presents with leukopenia, tachycardia, AKI, elevated lactate, and possible acute cholecystitis  - Blood and urine cultures are incubating - 30 cc/kg NS bolus given  - Empiric treatment with Zosyn initiated; vancomycin and Levaquin were added for possible HCAP as below - CXR with bronchitic changes; CTA chest with mets vs PNA in RUL; UA unremarkable  - Gen surgery is consulting and much appreciated; pt is not a surgical candidate but may benefit from percutaneous GB drain  - Admit to stepdown, continue IVF and abx, trend lactate   2. Leukopenia, thrombocytopenia  - WBC 900 and platelets 51,000 on admission; both indices worse from 02/02/16 - Could be chemotherapy-induced, but there is concern for sepsis as above  - No suggestion of active bleed at time of admission  - Type and screen requested; DIC panel ordered  - Maintain neutropenic precautions - Hold Ibrance for now - On broad-spectrum abx as above    3. AKI - SCr 1.41 on admission, up from apparent baseline of 0.8-0.9  - Likely a prerenal azotemia in setting of sepsis and poor oral intake  - Given 30 cc/kg NS bolus in ED and is continued on IVF infusion  - Repeat chem panel in am    4. Dyspnea, hypoxia  - Dyspnea has been chronic, but now worse  - CXR with bronchitic changes;  CTA neg for PE, but notable for nodular densities in RUL that are likely PNA, versus sarcoid or metastases    - On broad-spectrum abx with Levaquin for atypical coverage - Sputum culture and gram stain requeseted - Check urine for strep pneumo and legionella antigens  - Monitor with continuous pulse oximetry and titrate FiO2 to maintain sat >92%    5. Cancer of left breast, widely metastatic  - Diagnosed in Spring of 2015 - Has known widespread osseous metastatic disease - Appears to be widespread liver metastases, and possible lung and mediastinal involvement, on imaging obtained in ED  - Is following with oncology and currently under treatment with chemotherapy  -  Ibrance held for now given leukopenia and thrombocytopenia - Letrazole on hold for now given the suspected infection  6. Type II DM  - No recent A1c on file, was 6.3% in 2011  - Managed with metformin only at home; this will be held while in hospital and should not be given for at least 48 hrs after admin of contrast media in ED  - Check CBG q4h for now while not eating  - Institute a low-intensity sliding-scale correctional insulin    7. Chronic cancer pain - Under better control since recent admission - Managed at home with 25 mg methadone q8h, Dilaudid 4 mg q4h prn breakthrough pain, and Lidoderm patches - She is currently NPO but plan to resume methadone when she is appropriate for a diet   - For now, will treat with Dilaudid 1.5 mg IV q2h prn  - Palliative consultation requested - May benefit from fentanyl patch for basal pain-control while NPO    DVT prophylaxis: SCD's  Code Status: DNR Family Communication: Family updated at the bedside  Disposition Plan: Admit to stepdown unit  Consults called: General surgery Admission status: Inpatient    Vianne Bulls, MD [facility] Pager [phone]  If 7PM-7AM, please contact night-coverage [email or web address] Password [email or web address]  02/07/2016, 12:40 AM

## 2016-02-07 NOTE — Progress Notes (Signed)
[facility] PLAN OF CARE NOTE Patient: Shelly Hall [id number]   PCP: Elyn Peers, MD DOB: May 14, 1971   DOA: 02/06/2016   DOS: 02/07/2016    Patient was admitted by my colleague Dr.Opyd earlier on 02/07/2016. I have reviewed the H&P as well as assessment and plan and agree with the same. Important changes in the plan are listed below.  Plan of care: Principal Problem:   Sepsis ([place]) Active Problems:   Chemotherapy induced neutropenia ([place])   Diabetes mellitus type II, non insulin dependent ([place])   Cancer associated pain   Cancer of overlapping sites of left breast (H[facility])   Bone metastases ([place])   Acute cholecystitis   Leukopenia   Thrombocytopenia (HCC)   AKI (acute kidney injury) ([place])   Lactic acidosis   HCAP (healthcare-associated pneumonia) Patient wants everything to be done to help her get better. Agreeing to blood transfusion. HIDA scan ordered by Interventional radiology and if positive patient will undergo percutaneous drainage placement. Continue broad-spectrum antibiotics.  Author: Berle Mull, MD [facility] Pager: [phone] 02/07/2016 3:43 PM   If 7PM-7AM, please contact night-coverage at [email or web address], password [facility]

## 2016-02-08 ENCOUNTER — Inpatient Hospital Stay (HOSPITAL_COMMUNITY): Payer: Medicare Other

## 2016-02-08 LAB — CBC WITH DIFFERENTIAL/PLATELET
Basophils Absolute: 0 10*3/uL (ref 0.0–0.1)
Basophils Relative: 0 %
EOS ABS: 0.1 10*3/uL (ref 0.0–0.7)
Eosinophils Relative: 4 %
HEMATOCRIT: 26.8 % — AB (ref 36.0–46.0)
HEMOGLOBIN: 8.6 g/dL — AB (ref 12.0–15.0)
LYMPHS ABS: 0.2 10*3/uL — AB (ref 0.7–4.0)
Lymphocytes Relative: 15 %
MCH: 30.6 pg (ref 26.0–34.0)
MCHC: 32.1 g/dL (ref 30.0–36.0)
MCV: 95.4 fL (ref 78.0–100.0)
MONOS PCT: 3 %
Monocytes Absolute: 0.1 10*3/uL (ref 0.1–1.0)
NEUTROS ABS: 1.2 10*3/uL — AB (ref 1.7–7.7)
NEUTROS PCT: 78 %
Platelets: 67 10*3/uL — ABNORMAL LOW (ref 150–400)
RBC: 2.81 MIL/uL — ABNORMAL LOW (ref 3.87–5.11)
RDW: 19.7 % — ABNORMAL HIGH (ref 11.5–15.5)
WBC: 1.5 10*3/uL — ABNORMAL LOW (ref 4.0–10.5)

## 2016-02-08 LAB — GLUCOSE, CAPILLARY
GLUCOSE-CAPILLARY: 174 mg/dL — AB (ref 65–99)
GLUCOSE-CAPILLARY: 92 mg/dL (ref 65–99)
GLUCOSE-CAPILLARY: 95 mg/dL (ref 65–99)
Glucose-Capillary: 64 mg/dL — ABNORMAL LOW (ref 65–99)
Glucose-Capillary: 84 mg/dL (ref 65–99)

## 2016-02-08 LAB — TYPE AND SCREEN
ABO/RH(D): B POS
ANTIBODY SCREEN: NEGATIVE
Unit division: 0
Unit division: 0

## 2016-02-08 LAB — COMPREHENSIVE METABOLIC PANEL
ALBUMIN: 2.5 g/dL — AB (ref 3.5–5.0)
ALT: 43 U/L (ref 14–54)
ANION GAP: 7 (ref 5–15)
AST: 80 U/L — ABNORMAL HIGH (ref 15–41)
Alkaline Phosphatase: 157 U/L — ABNORMAL HIGH (ref 38–126)
BUN: 14 mg/dL (ref 6–20)
CO2: 21 mmol/L — AB (ref 22–32)
CREATININE: 1.37 mg/dL — AB (ref 0.44–1.00)
Calcium: 8.9 mg/dL (ref 8.9–10.3)
Chloride: 114 mmol/L — ABNORMAL HIGH (ref 101–111)
GFR calc non Af Amer: 45 mL/min — ABNORMAL LOW (ref >60)
GFR, EST AFRICAN AMERICAN: 53 mL/min — AB (ref >60)
Glucose, Bld: 134 mg/dL — ABNORMAL HIGH (ref 65–99)
POTASSIUM: 4.2 mmol/L (ref 3.5–5.1)
SODIUM: 142 mmol/L (ref 135–145)
Total Bilirubin: 0.5 mg/dL (ref 0.3–1.2)
Total Protein: 6.3 g/dL — ABNORMAL LOW (ref 6.5–8.1)

## 2016-02-08 LAB — PROTIME-INR
INR: 1.04
Prothrombin Time: 13.7 seconds (ref 11.4–15.2)

## 2016-02-08 LAB — URINE CULTURE: Culture: NO GROWTH

## 2016-02-08 LAB — LEGIONELLA PNEUMOPHILA SEROGP 1 UR AG: L. pneumophila Serogp 1 Ur Ag: NEGATIVE

## 2016-02-08 LAB — MAGNESIUM: Magnesium: 1.7 mg/dL (ref 1.7–2.4)

## 2016-02-08 MED ORDER — DEXTROSE 50 % IV SOLN
25.0000 mL | INTRAVENOUS | Status: DC | PRN
  Administered 2016-02-08: 25 mL via INTRAVENOUS
  Filled 2016-02-08: qty 50

## 2016-02-08 MED ORDER — TECHNETIUM TC 99M MEBROFENIN IV KIT
5.1000 | PACK | Freq: Once | INTRAVENOUS | Status: AC | PRN
  Administered 2016-02-08: 5 via INTRAVENOUS

## 2016-02-08 NOTE — Progress Notes (Signed)
Patient arrived to the floor from Nuclear Medicine. Patient denies any pain at this time. Vital signs stable. Patient placed on telemetry. Patient oriented to the room and call light and to fall safety plan.   Othella Boyer [facility] 02/08/2016

## 2016-02-08 NOTE — Progress Notes (Signed)
Patient transported to Nuclear Medicine at 1020. Patient on ICU telemetry monitor. Patient to be transferred to room 1402 after HIDA scan complete, report already called to receiving nurse. 1 pink tote bag, 1 blue duffle bag, and cell phone charger taken to new room (1402) and placed in closet. Patient wanted to keep black purse, purple cell phone and shoes with her during scan.

## 2016-02-08 NOTE — Progress Notes (Signed)
[facility] Progress Note  Patient: Shelly Hall [name]   PCP: Elyn Peers, MD DOB: 11/14/1970   DOA: 02/06/2016   DOS: 02/08/2016   Date of Service: the patient was seen and examined on 02/08/2016  Subjective: Patient mentions her pain is well-controlled. Denies any nausea or vomiting. Continues to have abdominal pain. No diarrhea. No fever no chills. Nutrition: He remains nothing by mouth.  Brief hospital course: Pt. with PMH of metastatic breast cancer, depression, type II DM, GERD, chronic cancer pain; admitted on 02/06/2016, with complaint of right upper quadrant pain and fatigue, was found to have suspected cholecystitis as well as pneumonia. Patient was started on broad-spectrum antibiotics and admitted to step down unit. Blood pressure improved and the patient was clinically stable and transferred to telemetry. Gen. surgery was consulted and felt that the patient was poor surgical candidate. Interventional radiology was consulted, HIDA scan is negative for cholecystitis. We will await further recommendation from general surgery as well as IR. Palliative care and medical oncology is also on board. Currently further plan is continue IV antibiotics.  Assessment and Plan: 1. Severe sepsis, suspected secondary to acute cholecystitis   - Presents with leukopenia, tachycardia, AKI, elevated lactate, and possible acute cholecystitis  - Blood and urine cultures are incubating - 30 cc/kg NS bolus given  - Empiric treatment with Zosyn initiated; vancomycin and Levaquin were added for possible HCAP as below Currently continue Zosyn and Levaquin, discontinue vancomycin. MRSA PCR negative. - CXR with bronchitic changes; CTA chest with mets vs PNA in RUL; UA unremarkable  - Gen surgery is consulting and much appreciated; pt is not a surgical candidate but may benefit from percutaneous GB drain   2. Leukopenia, thrombocytopenia and anemia - H&H stable after 1 PRBC on admission. -  WBC 900 and platelets 51,000 on admission; both indices worse from 02/02/16 - Could be chemotherapy-induced, but there is concern for sepsis as above  - No suggestion of active bleed at time of admission  - Type and screen requested; DIC panel negative - Maintain neutropenic precautions - Hold Ibrance for now - On broad-spectrum abx as above    3. AKI improving - SCr 1.41 on admission, up from apparent baseline of 0.8-0.9  - Likely a prerenal azotemia in setting of sepsis and poor oral intake  - Given 30 cc/kg NS bolus in ED and is continued on IVF infusion  - Repeat chem panel in am    4. Dyspnea, hypoxia  - Dyspnea has been chronic, but now worse  - CXR with bronchitic changes; CTA neg for PE, but notable for nodular densities in RUL that are likely PNA, versus sarcoid or metastases    - On broad-spectrum abx with Levaquin for atypical coverage - Sputum culture and gram stain positive for gram-positive cocci - Negative urine for strep pneumo and legionella antigens  - Monitor with continuous pulse oximetry and titrate FiO2 to maintain sat >92%    5. Cancer of left breast, widely metastatic  - Diagnosed in Spring of 2015 - Has known widespread osseous metastatic disease - Appears to be widespread liver metastases, and possible lung and mediastinal involvement, on imaging obtained in ED  - Is following with oncology and currently under treatment with chemotherapy  - Ibrance held for now given leukopenia and thrombocytopenia - Letrazole on hold for now given the suspected infection Palliative consulted although has not seen the patient yet.  6. Type II DM  Hypoglycemia - No recent A1c on file,  was 6.3% in 2011  - Hold metformin - Check CBG q4h for now while not eating  - Currently on D10 drip.  7. Chronic cancer pain - Under better control since recent admission - Managed at home with 25 mg methadone q8h, Dilaudid 4 mg q4h prn breakthrough pain, and Lidoderm patches - She  is currently NPO but plan to resume methadone when she is appropriate for a diet   - For now, will treat with Dilaudid 1.5 mg IV q2h prn  - Palliative consultation requested - May benefit from fentanyl patch for basal pain-control while NPO   Pain management: Continue Dilaudid, Lidoderm patch,  Activity: Consulted physical therapy Bowel regimen: last BM 02/07/2016 Diet: Currently remains nothing by mouth DVT Prophylaxis: mechanical compression device.  Advance goals of care discussion: DNR/DNI  Family Communication: no family was present at bedside, at the time of interview.  Disposition:  Discharge to home. Expected discharge date: 02/10/2016, pending pain control  Consultants: Medical oncology, interventional radiology, Gen. surgery, palliative care Procedures: None  Antibiotics: Anti-infectives    Start     Dose/Rate Route Frequency Ordered Stop   02/07/16 0600  piperacillin-tazobactam (ZOSYN) IVPB 3.375 g     3.375 g 12.5 mL/hr over 240 Minutes Intravenous Every 8 hours 02/07/16 0536     02/07/16 0130  levofloxacin (LEVAQUIN) IVPB 750 mg     750 mg 100 mL/hr over 90 Minutes Intravenous Daily at bedtime 02/07/16 0112     02/07/16 0130  vancomycin (VANCOCIN) IVPB 750 mg/150 ml premix  Status:  Discontinued     750 mg 150 mL/hr over 60 Minutes Intravenous 2 times daily 02/07/16 0129 02/07/16 1544   02/07/16 0115  azithromycin (ZITHROMAX) 500 mg in dextrose 5 % 250 mL IVPB  Status:  Discontinued     500 mg 250 mL/hr over 60 Minutes Intravenous Every 24 hours 02/07/16 0106 02/07/16 0108   02/06/16 2115  piperacillin-tazobactam (ZOSYN) IVPB 3.375 g     3.375 g 100 mL/hr over 30 Minutes Intravenous  Once 02/06/16 2103 02/06/16 2208        Intake/Output Summary (Last 24 hours) at 02/08/16 1854 Last data filed at 02/08/16 1456  Gross per 24 hour  Intake          2039.17 ml  Output             1600 ml  Net           439.17 ml   Filed Weights   02/06/16 1921 02/07/16 0500  02/08/16 0428  Weight: 76.7 kg (169 lb) 78.5 kg (173 lb 1 oz) 79 kg (174 lb 2.6 oz)    Objective: Physical Exam: Vitals:   02/08/16 0600 02/08/16 0800 02/08/16 1210 02/08/16 1454  BP: (!) 165/97 (!) 157/95 (!) 149/109 (!) 162/103  Pulse: 79 79 82 74  Resp: 14 12 16 17   Temp:  98.1 F (36.7 C) 97.4 F (36.3 C) 98.4 F (36.9 C)  TempSrc:  Oral Oral Oral  SpO2: 99% 92% 98% 97%  Weight:      Height:        General: Alert, Awake and Oriented to Time, Place and Person. Appear in moderate distress Eyes: PERRL, Conjunctiva normal ENT: Oral Mucosa clear moist. Neck: difficult to assess JVD, no Abnormal Mass Or lumps Cardiovascular: S1 and S2 Present, aortic systolic Murmur, Respiratory: Bilateral Air entry equal and Decreased, Clear to Auscultation, no Crackles, no wheezes Abdomen: Bowel Sound present, Soft and mild tenderness Skin: no redness,  no Rash  Extremities: no Pedal edema, no calf tenderness Neurologic: Grossly no focal neuro deficit. Bilaterally Equal motor strength  Data Reviewed: CBC:  Recent Labs Lab 02/02/16 1053 02/06/16 1914 02/07/16 0207 02/07/16 0402 02/08/16 0613  WBC 1.9* 0.9*  --  1.9* 1.5*  NEUTROABS 1.5 0.8*  --   --  1.2*  HGB 7.9* 14.4  --  6.9* 8.6*  HCT 24.8* 43.6  --  21.2* 26.8*  MCV 99.1 96.5  --  96.8 95.4  PLT 104* 51* 74* 74* 67*   Basic Metabolic Panel:  Recent Labs Lab 02/02/16 1054 02/06/16 1914 02/07/16 0402 02/08/16 0613  NA 137 138 141 142  K 4.5 4.9 5.0 4.2  CL  --  108 112* 114*  CO2 21* 23 23 21*  GLUCOSE 151* 114* 88 134*  BUN 21.2 24* 21* 14  CREATININE 1.7* 1.41* 1.37* 1.37*  CALCIUM 9.4 9.3 8.6* 8.9  MG  --   --   --  1.7    Liver Function Tests:  Recent Labs Lab 02/02/16 1054 02/06/16 1914 02/07/16 0402 02/08/16 0613  AST 68* 93* 85* 80*  ALT 38 54 48 43  ALKPHOS 242* 201* 176* 157*  BILITOT <0.30 0.3 0.5 0.5  PROT 6.7 6.9 6.4* 6.3*  ALBUMIN 2.3* 2.9* 2.5* 2.5*    Recent Labs Lab 02/06/16 1914    LIPASE 65*   No results for input(s): AMMONIA in the last 168 hours. Coagulation Profile:  Recent Labs Lab 02/07/16 0207 02/08/16 0613  INR 1.00 1.04   Cardiac Enzymes: No results for input(s): CKTOTAL, CKMB, CKMBINDEX, TROPONINI in the last 168 hours. BNP (last 3 results) No results for input(s): PROBNP in the last 8760 hours.  CBG:  Recent Labs Lab 02/07/16 2332 02/08/16 0402 02/08/16 0457 02/08/16 0821 02/08/16 1634  GLUCAP 83 64* 174* 92 95    Studies: Nm Hepatobiliary Liver Func  Result Date: 02/08/2016 CLINICAL DATA:  Right upper quadrant pain . EXAM: NUCLEAR MEDICINE HEPATOBILIARY IMAGING TECHNIQUE: Sequential images of the abdomen were obtained out to 60 minutes following intravenous administration of radiopharmaceutical. RADIOPHARMACEUTICALS:  5.1 mCi Tc-81m  Choletec IV COMPARISON:  Ultrasound 02/06/2016.  CT 02/06/2016. FINDINGS: Liver visualizes normally. Gallbladder activity is visualized, consistent with patency of cystic duct. Biliary activity passes into small bowel, consistent with patent common bile duct. IMPRESSION: Normal exam.  Gallbladder visualizes normally. Electronically Signed   By: Marcello Moores  Register   On: 02/08/2016 13:24     Scheduled Meds: . ferrous sulfate  325 mg Oral Q breakfast  . FLUoxetine  20 mg Oral Daily  . fluticasone  2 spray Each Nare Daily  . gabapentin  300 mg Oral 2 times per day  . gabapentin  600 mg Oral QHS  . insulin aspart  0-9 Units Subcutaneous Q4H  . levofloxacin (LEVAQUIN) IV  750 mg Intravenous QHS  . lidocaine  2 patch Transdermal Q24H  . methadone  25 mg Oral Q8H  . piperacillin-tazobactam (ZOSYN)  IV  3.375 g Intravenous Q8H  . polyethylene glycol  17 g Oral Daily  . senna  1 tablet Oral BID  . sodium chloride flush  3 mL Intravenous Q12H  . thiamine  100 mg Oral Daily   Continuous Infusions: . dextrose 75 mL/hr at 02/08/16 1605   PRN Meds: dextrose, HYDROmorphone (DILAUDID) injection, magnesium hydroxide,  ondansetron **OR** ondansetron (ZOFRAN) IV, sodium phosphate, sorbitol  Time spent: 30 minutes  Author: Berle Mull, MD [facility] Pager: [phone] 02/08/2016 6:54 PM  If 7PM-7AM, please contact night-coverage at [email or web address], password [facility]

## 2016-02-08 NOTE — Progress Notes (Signed)
Hypoglycemic Event  CBG: 64  Treatment: 1 amp D50  Symptoms: N/A  Follow-up CBG: Time: 15 min after D50     CBG Result: 174  Possible Reasons for Event: Patient NPO  Comments/MD notified: [place], [place]

## 2016-02-08 NOTE — Progress Notes (Signed)
Subjective: Still complains of some RUQ pain  Objective: Vital signs in last 24 hours: Temp:  [97.2 F (36.2 C)-98.3 F (36.8 C)] 98.1 F (36.7 C) (09/05 0800) Pulse Rate:  [76-105] 79 (09/05 0800) Resp:  [11-29] 12 (09/05 0800) BP: (143-175)/(70-108) 157/95 (09/05 0800) SpO2:  [92 %-100 %] 92 % (09/05 0800) Weight:  [79 kg (174 lb 2.6 oz)] 79 kg (174 lb 2.6 oz) (09/05 0428) Last BM Date: 02/07/16  Intake/Output from previous day: 09/04 0701 - 09/05 0700 In: 2754.2 [I.V.:1979.2; Blood:325; IV Piggyback:450] Out: 2350 [Urine:2350] Intake/Output this shift: Total I/O In: 375 [I.V.:375] Out: 400 [Urine:400]  Resp: clear to auscultation bilaterally Cardio: regular rate and rhythm GI: soft, moderate focal RUQ pain  Lab Results:   Recent Labs  02/07/16 0402 02/08/16 0613  WBC 1.9* 1.5*  HGB 6.9* 8.6*  HCT 21.2* 26.8*  PLT 74* 67*   BMET  Recent Labs  02/07/16 0402 02/08/16 0613  NA 141 142  K 5.0 4.2  CL 112* 114*  CO2 23 21*  GLUCOSE 88 134*  BUN 21* 14  CREATININE 1.37* 1.37*  CALCIUM 8.6* 8.9   PT/INR  Recent Labs  02/07/16 0207 02/08/16 0613  LABPROT 13.2 13.7  INR 1.00 1.04   ABG No results for input(s): PHART, HCO3 in the last 72 hours.  Invalid input(s): PCO2, PO2  Studies/Results: Dg Chest 2 View  Result Date: 02/06/2016 CLINICAL DATA:  Productive cough and shortness of breath. History of metastatic breast cancer. EXAM: CHEST  2 VIEW COMPARISON:  PA and lateral chest 01/07/2016.  CT chest 01/17/2016. FINDINGS: Lung volumes are lower than on the prior chest film with associated basilar atelectasis. Peribronchial thickening is noted. No pneumothorax or pleural effusion. Heart size is normal. Surgical clips left axilla are noted. Extensive osseous sclerosis consistent with metastatic disease is seen. Remote fracture distal right clavicle is identified. IMPRESSION: Bronchitic change without focal process. Extensive osseous metastatic disease.  Electronically Signed   By: Inge Rise M.D.   On: 02/06/2016 18:13   Ct Angio Chest Pe W And/or Wo Contrast  Result Date: 02/06/2016 CLINICAL DATA:  45 year old female with history of breast cancer. Rule out PE. EXAM: CT ANGIOGRAPHY CHEST CT ABDOMEN AND PELVIS WITH CONTRAST TECHNIQUE: Multidetector CT imaging of the chest was performed using the standard protocol during bolus administration of intravenous contrast. Multiplanar CT image reconstructions and MIPs were obtained to evaluate the vascular anatomy. Multidetector CT imaging of the abdomen and pelvis was performed using the standard protocol during bolus administration of intravenous contrast. CONTRAST:  100 cc Isovue 370 COMPARISON:  Chest CT dated 01/17/2016, abdominal CT dated 08/19/2015 and bone scan dated 01/10/2016 FINDINGS: CTA CHEST FINDINGS There is shallow inspiration. Minimal bibasilar dependent atelectatic changes. Diffuse airspace ground-glass density, likely related to atelectatic changes and hypovolemia. There is apparent diffuse minimal interlobular septal thickening with Kerley B-lines, likely mild pulmonary edema. Small faint nodular densities along the bronchovascular interstitium predominantly in the right upper and middle lobes noted. Although these may be inflammatory/infectious in etiology, an early pulmonary metastases or lymphangitic spread of the tumor is not entirely excluded. Clinical correlation and follow-up recommended. There is no pleural effusion or pneumothorax. The central airways are patent. The thoracic aorta appears unremarkable. The origins of the great vessels of the aortic arch appear patent. There is no CT evidence of pulmonary embolism. There is mild cardiomegaly. No pericardial effusion. There are bilateral hilar adenopathy. The largest lymph nodes measure up to 2.7 x 1.7 cm (  previously 1.9 x 1.9 cm) in the right infrahilar region. Anterior mediastinal adenopathy in the prevascular space noted similar to  prior study. Right posterior mediastinal/subcarinal adenopathy measures 2.3 x 2.3 cm. The esophagus is grossly unremarkable. Left thyroidectomy. Left axillary surgical clips noted. No axillary adenopathy identified. Left-sided mastectomy. The chest wall soft tissues are otherwise unremarkable. There is extensive osseous metastatic disease. There is nondisplaced fracture of the right posterior fourth and tenth ribs as seen on the prior CT, likely pathologic fracture. No new fracture. T1 and T8 compression fracture deformity similar to prior study. CT ABDOMEN and PELVIS FINDINGS No intra-abdominal free air or free fluid. There are extensive hepatic hypodense lesions compatible with metastatic disease. There is associated irregular appearance of the hepatic contour. There multiple small stones within the gallbladder. There is thickened appearance of the gallbladder wall. Ultrasound may provide better evaluation of the gallbladder if an acute cholecystitis is clinically suspected. The pancreas, spleen, adrenal glands, kidneys, visualized ureters, and urinary bladder appear unremarkable. Hysterectomy. There is moderate stool throughout the colon. No evidence of bowel obstruction or active inflammation. Normal appendix. There is mild aortoiliac atherosclerotic disease. The origins of the celiac axis, SMA, IMA as well as the origins of the renal arteries appear patent. The the IVC, SMV, splenic vein, and main portal vein are patent. No portal venous gas identified. Upper abdominal adenopathy in the gastrohepatic space, and portacaval adenopathy. The abdominal wall soft tissues appear unremarkable. There is extensive osseous metastatic disease. There is pathologic compression fracture of the L5 vertebra with more than 50% loss of vertebral body height. There is retropulsed fracture fragment from the superior posterior corner of the L5 vertebral with moderate focal narrowing of the central canal. There is disc desiccation  with vacuum phenomena at L4-5 and L5-S1. Overall there has been interval progression of osseous metastatic disease compared to the study dated 08/19/2015. Review of the MIP images confirms the above findings. IMPRESSION: No CT evidence of pulmonary embolism. Mild atelectatic changes with probable mild pulmonary edema. Small nodular densities predominantly in the right upper and middle lobes may be inflammatory or infectious in etiology. Metastatic disease or less likely early lymphangitic spread of tumor are not excluded. Clinical correlation and follow-up recommended. Interval progression of hilar and mediastinal adenopathy. Extensive hepatic metastatic disease. Small gallstones with diffuse gallbladder wall thickening. Ultrasound may provide better evaluation of the gallbladder if an acute cholecystitis is clinically suspected. Upper abdominal adenopathy. Extensive osseous metastatic disease with overall progression since the prior study. Multilevel compression deformities. L5 pathologic compression fracture, new from study dated 08/19/2015 with associated retropulsed fragment and focal narrowing of the central canal. Electronically Signed   By: Anner Crete M.D.   On: 02/06/2016 22:34   Ct Abdomen Pelvis W Contrast  Result Date: 02/06/2016 CLINICAL DATA:  45 year old female with history of breast cancer. Rule out PE. EXAM: CT ANGIOGRAPHY CHEST CT ABDOMEN AND PELVIS WITH CONTRAST TECHNIQUE: Multidetector CT imaging of the chest was performed using the standard protocol during bolus administration of intravenous contrast. Multiplanar CT image reconstructions and MIPs were obtained to evaluate the vascular anatomy. Multidetector CT imaging of the abdomen and pelvis was performed using the standard protocol during bolus administration of intravenous contrast. CONTRAST:  100 cc Isovue 370 COMPARISON:  Chest CT dated 01/17/2016, abdominal CT dated 08/19/2015 and bone scan dated 01/10/2016 FINDINGS: CTA CHEST  FINDINGS There is shallow inspiration. Minimal bibasilar dependent atelectatic changes. Diffuse airspace ground-glass density, likely related to atelectatic changes and hypovolemia. There  is apparent diffuse minimal interlobular septal thickening with Kerley B-lines, likely mild pulmonary edema. Small faint nodular densities along the bronchovascular interstitium predominantly in the right upper and middle lobes noted. Although these may be inflammatory/infectious in etiology, an early pulmonary metastases or lymphangitic spread of the tumor is not entirely excluded. Clinical correlation and follow-up recommended. There is no pleural effusion or pneumothorax. The central airways are patent. The thoracic aorta appears unremarkable. The origins of the great vessels of the aortic arch appear patent. There is no CT evidence of pulmonary embolism. There is mild cardiomegaly. No pericardial effusion. There are bilateral hilar adenopathy. The largest lymph nodes measure up to 2.7 x 1.7 cm (previously 1.9 x 1.9 cm) in the right infrahilar region. Anterior mediastinal adenopathy in the prevascular space noted similar to prior study. Right posterior mediastinal/subcarinal adenopathy measures 2.3 x 2.3 cm. The esophagus is grossly unremarkable. Left thyroidectomy. Left axillary surgical clips noted. No axillary adenopathy identified. Left-sided mastectomy. The chest wall soft tissues are otherwise unremarkable. There is extensive osseous metastatic disease. There is nondisplaced fracture of the right posterior fourth and tenth ribs as seen on the prior CT, likely pathologic fracture. No new fracture. T1 and T8 compression fracture deformity similar to prior study. CT ABDOMEN and PELVIS FINDINGS No intra-abdominal free air or free fluid. There are extensive hepatic hypodense lesions compatible with metastatic disease. There is associated irregular appearance of the hepatic contour. There multiple small stones within the  gallbladder. There is thickened appearance of the gallbladder wall. Ultrasound may provide better evaluation of the gallbladder if an acute cholecystitis is clinically suspected. The pancreas, spleen, adrenal glands, kidneys, visualized ureters, and urinary bladder appear unremarkable. Hysterectomy. There is moderate stool throughout the colon. No evidence of bowel obstruction or active inflammation. Normal appendix. There is mild aortoiliac atherosclerotic disease. The origins of the celiac axis, SMA, IMA as well as the origins of the renal arteries appear patent. The the IVC, SMV, splenic vein, and main portal vein are patent. No portal venous gas identified. Upper abdominal adenopathy in the gastrohepatic space, and portacaval adenopathy. The abdominal wall soft tissues appear unremarkable. There is extensive osseous metastatic disease. There is pathologic compression fracture of the L5 vertebra with more than 50% loss of vertebral body height. There is retropulsed fracture fragment from the superior posterior corner of the L5 vertebral with moderate focal narrowing of the central canal. There is disc desiccation with vacuum phenomena at L4-5 and L5-S1. Overall there has been interval progression of osseous metastatic disease compared to the study dated 08/19/2015. Review of the MIP images confirms the above findings. IMPRESSION: No CT evidence of pulmonary embolism. Mild atelectatic changes with probable mild pulmonary edema. Small nodular densities predominantly in the right upper and middle lobes may be inflammatory or infectious in etiology. Metastatic disease or less likely early lymphangitic spread of tumor are not excluded. Clinical correlation and follow-up recommended. Interval progression of hilar and mediastinal adenopathy. Extensive hepatic metastatic disease. Small gallstones with diffuse gallbladder wall thickening. Ultrasound may provide better evaluation of the gallbladder if an acute  cholecystitis is clinically suspected. Upper abdominal adenopathy. Extensive osseous metastatic disease with overall progression since the prior study. Multilevel compression deformities. L5 pathologic compression fracture, new from study dated 08/19/2015 with associated retropulsed fragment and focal narrowing of the central canal. Electronically Signed   By: Anner Crete M.D.   On: 02/06/2016 22:34   US Abdomen Limited Ruq  Result Date: 02/06/2016 CLINICAL DATA:  RIGHT upper quadrant  pain for 3 days, history hypertension, sarcoidosis, type II diabetes mellitus, thyroid cancer, breast cancer EXAM: US ABDOMEN LIMITED - RIGHT UPPER QUADRANT COMPARISON:  CT abdomen pelvis 02/06/2016 FINDINGS: Gallbladder: Thickened gallbladder wall. Dependent shadowing calculi. Small gallbladder polyps noted. Sonographic Murphy sign present. No definite pericholecystic fluid. Common bile duct: Diameter: 6 mm diameter, upper normal Liver: Markedly heterogeneous appearance of liver parenchyma with multiple rounded areas of hypo echogenicity compatible with widespread hepatic metastases as noted on earlier CT. Hepatopetal portal venous flow. No RIGHT upper quadrant free fluid. IMPRESSION: Thickened gallbladder wall with gallstones, gallbladder polyps, and sonographic Murphy sign suspicious for acute cholecystitis. Widespread hepatic metastatic disease. Electronically Signed   By: Lavonia Dana M.D.   On: 02/06/2016 22:50    Anti-infectives: Anti-infectives    Start     Dose/Rate Route Frequency Ordered Stop   02/07/16 0600  piperacillin-tazobactam (ZOSYN) IVPB 3.375 g     3.375 g 12.5 mL/hr over 240 Minutes Intravenous Every 8 hours 02/07/16 0536     02/07/16 0130  levofloxacin (LEVAQUIN) IVPB 750 mg     750 mg 100 mL/hr over 90 Minutes Intravenous Daily at bedtime 02/07/16 0112     02/07/16 0130  vancomycin (VANCOCIN) IVPB 750 mg/150 ml premix  Status:  Discontinued     750 mg 150 mL/hr over 60 Minutes Intravenous 2  times daily 02/07/16 0129 02/07/16 1544   02/07/16 0115  azithromycin (ZITHROMAX) 500 mg in dextrose 5 % 250 mL IVPB  Status:  Discontinued     500 mg 250 mL/hr over 60 Minutes Intravenous Every 24 hours 02/07/16 0106 02/07/16 0108   02/06/16 2115  piperacillin-tazobactam (ZOSYN) IVPB 3.375 g     3.375 g 100 mL/hr over 30 Minutes Intravenous  Once 02/06/16 2103 02/06/16 2208      Assessment/Plan: s/p * No surgery found * Plan for HIDA prior to placement of perc chole drain  Currently not a good surgical candidate secondary to metastatic disease and neutropenia and thrombocytopenia. Will follow  LOS: 1 day    TOTH III,PAUL S 02/08/2016

## 2016-02-08 NOTE — Progress Notes (Signed)
   02/08/16 1500  Clinical Encounter Type  Visited With Patient and family together  Visit Type Psychological support;Spiritual support  Referral From Chaplain;Other (Comment) (saw patient based on palliative care consult)  Counseling intern visited with patient to introduce counseling and spiritual care services. Patient welcomed intern's visit. She was alert and spoke clearly to intern and her mother, who was also visiting patient during this time. Patient indicated that she had been visited by her pastor earlier in the day and another minister the previous day and said she was "taken care of on that front", referring to her spiritual care needs. She stated that her pastor had offered her communion but she was unable to take it due to not being able to eat or drink. Patient stated that she is excited about her birthday tomorrow, which she stated she has in common with a younger relative (2-year-old), and that the best gift would be for her to get out of the hospital to celebrate. Patient's mother noted that patient appeared stronger today. Intern offered that counseling and spiritual care services are available to both the patient andto her mother and family. As intern left, she shook patient's hand, which patient held firmly for several seconds while inviting intern to visit her again.  Lamount Cohen, Counseling Intern Department for [facility] and [facility] Supervisor - [place], [place] [phone]

## 2016-02-09 DIAGNOSIS — A419 Sepsis, unspecified organism: Principal | ICD-10-CM

## 2016-02-09 DIAGNOSIS — T451X5A Adverse effect of antineoplastic and immunosuppressive drugs, initial encounter: Secondary | ICD-10-CM

## 2016-02-09 DIAGNOSIS — J189 Pneumonia, unspecified organism: Secondary | ICD-10-CM

## 2016-02-09 DIAGNOSIS — N179 Acute kidney failure, unspecified: Secondary | ICD-10-CM

## 2016-02-09 DIAGNOSIS — D72819 Decreased white blood cell count, unspecified: Secondary | ICD-10-CM

## 2016-02-09 DIAGNOSIS — E872 Acidosis: Secondary | ICD-10-CM

## 2016-02-09 DIAGNOSIS — D701 Agranulocytosis secondary to cancer chemotherapy: Secondary | ICD-10-CM

## 2016-02-09 DIAGNOSIS — E119 Type 2 diabetes mellitus without complications: Secondary | ICD-10-CM

## 2016-02-09 DIAGNOSIS — G893 Neoplasm related pain (acute) (chronic): Secondary | ICD-10-CM

## 2016-02-09 DIAGNOSIS — D696 Thrombocytopenia, unspecified: Secondary | ICD-10-CM

## 2016-02-09 DIAGNOSIS — C50812 Malignant neoplasm of overlapping sites of left female breast: Secondary | ICD-10-CM

## 2016-02-09 LAB — CULTURE, RESPIRATORY W GRAM STAIN

## 2016-02-09 LAB — COMPREHENSIVE METABOLIC PANEL
ALT: 42 U/L (ref 14–54)
AST: 85 U/L — AB (ref 15–41)
Albumin: 2.5 g/dL — ABNORMAL LOW (ref 3.5–5.0)
Alkaline Phosphatase: 166 U/L — ABNORMAL HIGH (ref 38–126)
Anion gap: 10 (ref 5–15)
BUN: 12 mg/dL (ref 6–20)
CHLORIDE: 110 mmol/L (ref 101–111)
CO2: 23 mmol/L (ref 22–32)
CREATININE: 1.38 mg/dL — AB (ref 0.44–1.00)
Calcium: 8.9 mg/dL (ref 8.9–10.3)
GFR, EST AFRICAN AMERICAN: 52 mL/min — AB (ref >60)
GFR, EST NON AFRICAN AMERICAN: 45 mL/min — AB (ref >60)
Glucose, Bld: 87 mg/dL (ref 65–99)
POTASSIUM: 4 mmol/L (ref 3.5–5.1)
SODIUM: 143 mmol/L (ref 135–145)
Total Bilirubin: 0.6 mg/dL (ref 0.3–1.2)
Total Protein: 6.4 g/dL — ABNORMAL LOW (ref 6.5–8.1)

## 2016-02-09 LAB — CULTURE, RESPIRATORY: CULTURE: NORMAL

## 2016-02-09 LAB — CBC WITH DIFFERENTIAL/PLATELET
Basophils Absolute: 0 10*3/uL (ref 0.0–0.1)
Basophils Relative: 1 %
Eosinophils Absolute: 0.1 10*3/uL (ref 0.0–0.7)
Eosinophils Relative: 3 %
HEMATOCRIT: 26.5 % — AB (ref 36.0–46.0)
HEMOGLOBIN: 8.9 g/dL — AB (ref 12.0–15.0)
LYMPHS ABS: 0.3 10*3/uL — AB (ref 0.7–4.0)
LYMPHS PCT: 15 %
MCH: 31.4 pg (ref 26.0–34.0)
MCHC: 33.6 g/dL (ref 30.0–36.0)
MCV: 93.6 fL (ref 78.0–100.0)
MONOS PCT: 5 %
Monocytes Absolute: 0.1 10*3/uL (ref 0.1–1.0)
NEUTROS PCT: 76 %
Neutro Abs: 1.3 10*3/uL — ABNORMAL LOW (ref 1.7–7.7)
Platelets: 57 10*3/uL — ABNORMAL LOW (ref 150–400)
RBC: 2.83 MIL/uL — AB (ref 3.87–5.11)
RDW: 19.4 % — ABNORMAL HIGH (ref 11.5–15.5)
WBC: 1.7 10*3/uL — AB (ref 4.0–10.5)

## 2016-02-09 LAB — GLUCOSE, CAPILLARY
GLUCOSE-CAPILLARY: 100 mg/dL — AB (ref 65–99)
GLUCOSE-CAPILLARY: 138 mg/dL — AB (ref 65–99)
GLUCOSE-CAPILLARY: 116 mg/dL — AB (ref 65–99)
Glucose-Capillary: 90 mg/dL (ref 65–99)
Glucose-Capillary: 91 mg/dL (ref 65–99)
Glucose-Capillary: 92 mg/dL (ref 65–99)

## 2016-02-09 LAB — MAGNESIUM: MAGNESIUM: 1.6 mg/dL — AB (ref 1.7–2.4)

## 2016-02-09 NOTE — Progress Notes (Signed)
   02/09/16 1700  Clinical Encounter Type  Visited With Patient and family together  Visit Type Psychological support;Spiritual support  Counseling intern visited briefly with patient to wish her a happy birthday. Patient was being visited by her mother, granddaughter, and one other person. Patient was sitting up in a chair; she appeared alert and in good spirits. Patient said she had had a very good day.   Lamount Cohen, Counseling Intern Department for [facility] and [facility] Supervisor - [place], [place]

## 2016-02-09 NOTE — Progress Notes (Signed)
COURTESY VISIT:  Shelly Hall is enjoying her birthday with her family tonight. Will return in AM.  GM

## 2016-02-09 NOTE — Progress Notes (Signed)
Palliative consult received. Our team knows Shelly Hall from her prior hospitalization. Fortunately her pain management is going well after challenging symptoms last admission. She is a DNR which I established with her on her last admission. With her disease progression and documented goals she would be hospice appropriate- I will discuss with Dr. Jana Hakim to determine so I know how to proceed with goals of care and how to best support her moving forward.  Shelly Hacker, DO Palliative Medicine

## 2016-02-09 NOTE — Progress Notes (Signed)
PROGRESS NOTE    Shelly Hall  [id number] DOB: 30-Jul-1970 DOA: 02/06/2016 PCP: Elyn Peers, MD   Chief Complaint: RUQ pain, dyspnea, fatigue    Brief Narrative:  HPI on 02/07/2016 by Dr. Christia Reading Opyd Shelly Hall is a 45 y.o. 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 Patient is currently under the care of oncology for treatment of her metastatic breast cancer. She was recently admitted for pain control and was doing somewhat better back at [facility] when she noted the insidious development of lethargy, worsening dyspnea, and right upper quadrant abdominal pain approximately 3 days ago. She reports the pain is severe, worse with deep inspiration or cough, and localized to the right upper quadrant. She is unable to say whether a meal worsens this she has not had an appetite during this illness. She has been taking 25 mg of methadone 3 times daily, in addition to 4 mg oral Dilaudid every 4 hours at [facility]. Despite these measures, pain became severe and unmanageable. She presents for evaluation of this.   Assessment & Plan   Severe sepsis, suspected secondary to acute cholecystitis  -Presents with leukopenia, tachycardia, AKI, elevated lactate, and possible acute cholecystitis  -Blood cultures show no growth to date -Urine culture shows no growth -Empiric treatment with Zosyn initiated; vancomycin and Levaquin were added for possible HCAP as below Currently continue Zosyn and Levaquin, discontinue vancomycin and levaquin. MRSA PCR negative. -CXR with bronchitic changes; CTA chest with mets vs PNA in RUL -Gen surgery consulted and appreciated, does not appear to a surgical candidate due to neutropenia and thrombocytopenia. Per patient, she states she  is not getting a drain -HIDA: normal exam -Currently NPO, pending further recommendations from surgery   Pancytopenia- Leukopenia, thrombocytopenia and anemia -H&H stable after 1 PRBC on admission. Hemoglobin currently 8.9 -WBC 900 and platelets 51,000 on admission; both indices worse from 02/02/16 -Could be chemotherapy-induced, but there is concern for sepsis as above  -Currently platelets 57 -No suggestion of active bleed at time of admission  -Type and screen requested; DIC panel negative -Maintain neutropenic precautions -Hold Ibrance for now  Acute kidney injury -SCr 1.41 on admission (baseline around 1.2-1.3- was 1.7 prior to admission) -Likely a prerenal azotemia in setting of sepsis and poor oral intake  -Was placed on IVF -Currently Creatinine 1.38, continue to monitor BMP  Dyspnea/hypoxia  -Dyspnea has been chronic -CXR with bronchitic changes; CTA neg for PE, but notable for nodular densities in RUL that are likely PNA, versus sarcoid or metastases  -Continue antibiotics  -Sputum culture and gram stain positive for gram-positive cocci -Negative urine for strep pneumo and legionella antigens  -Monitor with continuous pulse oximetry and titrate FiO2 to maintain sat >92%   Cancer of left breast, widely metastatic  -Diagnosed in Spring of 2015 -Has known widespread osseous metastatic disease -Appears to be widespread liver metastases, and possible lung and mediastinal involvement, on imaging obtained in ED  -oncology made aware of patient's admission. Spoke with Dr. Jana Hakim, patient is not a candidate for hospice as she continues to want treatment.   Shelly Hall held for now given leukopenia and thrombocytopenia -Letrazole on hold for now given the suspected infection  Diabetes Mellitus, type II complicated with episodes of hypoglycemia -No recent A1c on file, was 6.3% in  2011  -Hold metformin -Check CBG q4h for now while not eating  -Currently on D10  drip.  Chronic cancer pain -Under better control since recent admission -Managed at [facility] with 25 mg methadone q8h, Dilaudid 4 mg q4h prn breakthrough pain, and Lidoderm patches -She is currently NPO but plan to resume methadone when she is appropriate for a diet  -For now, continue Dilaudid 1.5 mg IV q2h prn  -Palliative consultation requested -May benefit from fentanyl patch for basal pain-control while NPO  DVT Prophylaxis  SCDs  Code Status: DNR  Family Communication: Friend at bedside  Disposition Plan: Admitted. Continue current treatment. Pending further recommendations from surgery.  Consultants General surgery Palliative care Oncology  Procedures  HIDA  Antibiotics   Anti-infectives    Start     Dose/Rate Route Frequency Ordered Stop   02/07/16 0600  piperacillin-tazobactam (ZOSYN) IVPB 3.375 g     3.375 g 12.5 mL/hr over 240 Minutes Intravenous Every 8 hours 02/07/16 0536     02/07/16 0130  levofloxacin (LEVAQUIN) IVPB 750 mg  Status:  Discontinued     750 mg 100 mL/hr over 90 Minutes Intravenous Daily at bedtime 02/07/16 0112 02/09/16 0907   02/07/16 0130  vancomycin (VANCOCIN) IVPB 750 mg/150 ml premix  Status:  Discontinued     750 mg 150 mL/hr over 60 Minutes Intravenous 2 times daily 02/07/16 0129 02/07/16 1544   02/07/16 0115  azithromycin (ZITHROMAX) 500 mg in dextrose 5 % 250 mL IVPB  Status:  Discontinued     500 mg 250 mL/hr over 60 Minutes Intravenous Every 24 hours 02/07/16 0106 02/07/16 0108   02/06/16 2115  piperacillin-tazobactam (ZOSYN) IVPB 3.375 g     3.375 g 100 mL/hr over 30 Minutes Intravenous  Once 02/06/16 2103 02/06/16 2208      Subjective:   Shelly Hall seen and examined today.  Patient does endorse that she is currently hungry and would like to eat something today. She denies any abdominal pain at this time. Denies any nausea or vomiting. States that she was told she would not have surgery or drain placement. Currently denies  chest pain, shortness of breath, dizziness or headache, diarrhea or constipation.    Objective:   Vitals:   02/08/16 1210 02/08/16 1454 02/08/16 2028 02/09/16 0510  BP: (!) 149/109 (!) 162/103 (!) 154/97 130/88  Pulse: 82 74 65 80  Resp: 16 17 18 18   Temp: 97.4 F (36.3 C) 98.4 F (36.9 C) 98.8 F (37.1 C) 98 F (36.7 C)  TempSrc: Oral Oral Oral Oral  SpO2: 98% 97% 96% 98%  Weight:    79.1 kg (174 lb 6.1 oz)  Height:        Intake/Output Summary (Last 24 hours) at 02/09/16 1206 Last data filed at 02/09/16 0700  Gross per 24 hour  Intake             1875 ml  Output                0 ml  Net             1875 ml   Filed Weights   02/07/16 0500 02/08/16 0428 02/09/16 0510  Weight: 78.5 kg (173 lb 1 oz) 79 kg (174 lb 2.6 oz) 79.1 kg (174 lb 6.1 oz)    Exam  General: Well developed, well nourished, NAD, appears stated age  HEENT: NCAT, mucous membranes moist.   Cardiovascular: S1 S2 auscultated, 2/6 SEM, RRR  Respiratory: Clear to auscultation  bilaterally with equal chest rise  Abdomen: Soft, nontender, nondistended, + bowel sounds  Extremities: warm dry without cyanosis clubbing or edema  Neuro: AAOx3, nonfocal  Psych: Normal affect and demeanor with intact judgement and insight   Data Reviewed: I have personally reviewed following labs and imaging studies  CBC:  Recent Labs Lab 02/06/16 1914 02/07/16 0207 02/07/16 0402 02/08/16 0613 02/09/16 0534  WBC 0.9*  --  1.9* 1.5* 1.7*  NEUTROABS 0.8*  --   --  1.2* 1.3*  HGB 14.4  --  6.9* 8.6* 8.9*  HCT 43.6  --  21.2* 26.8* 26.5*  MCV 96.5  --  96.8 95.4 93.6  PLT 51* 74* 74* 67* 57*   Basic Metabolic Panel:  Recent Labs Lab 02/06/16 1914 02/07/16 0402 02/08/16 0613 02/09/16 0534  NA 138 141 142 143  K 4.9 5.0 4.2 4.0  CL 108 112* 114* 110  CO2 23 23 21* 23  GLUCOSE 114* 88 134* 87  BUN 24* 21* 14 12  CREATININE 1.41* 1.37* 1.37* 1.38*  CALCIUM 9.3 8.6* 8.9 8.9  MG  --   --  1.7 1.6*    GFR: Estimated Creatinine Clearance: 50.2 mL/min (by C-G formula based on SCr of 1.38 mg/dL). Liver Function Tests:  Recent Labs Lab 02/06/16 1914 02/07/16 0402 02/08/16 0613 02/09/16 0534  AST 93* 85* 80* 85*  ALT 54 48 43 42  ALKPHOS 201* 176* 157* 166*  BILITOT 0.3 0.5 0.5 0.6  PROT 6.9 6.4* 6.3* 6.4*  ALBUMIN 2.9* 2.5* 2.5* 2.5*    Recent Labs Lab 02/06/16 1914  LIPASE 65*   No results for input(s): AMMONIA in the last 168 hours. Coagulation Profile:  Recent Labs Lab 02/07/16 0207 02/08/16 0613  INR 1.00 1.04   Cardiac Enzymes: No results for input(s): CKTOTAL, CKMB, CKMBINDEX, TROPONINI in the last 168 hours. BNP (last 3 results) No results for input(s): PROBNP in the last 8760 hours. HbA1C: No results for input(s): HGBA1C in the last 72 hours. CBG:  Recent Labs Lab 02/08/16 2023 02/09/16 0027 02/09/16 0503 02/09/16 0750 02/09/16 1141  GLUCAP 84 90 92 91 100*   Lipid Profile: No results for input(s): CHOL, HDL, LDLCALC, TRIG, CHOLHDL, LDLDIRECT in the last 72 hours. Thyroid Function Tests: No results for input(s): TSH, T4TOTAL, FREET4, T3FREE, THYROIDAB in the last 72 hours. Anemia Panel: No results for input(s): VITAMINB12, FOLATE, FERRITIN, TIBC, IRON, RETICCTPCT in the last 72 hours. Urine analysis:    Component Value Date/Time   COLORURINE YELLOW 02/06/2016 1923   APPEARANCEUR CLEAR 02/06/2016 1923   LABSPEC 1.010 02/06/2016 1923   PHURINE 5.5 02/06/2016 1923   GLUCOSEU NEGATIVE 02/06/2016 1923   HGBUR NEGATIVE 02/06/2016 1923   BILIRUBINUR NEGATIVE 02/06/2016 1923   KETONESUR NEGATIVE 02/06/2016 1923   PROTEINUR NEGATIVE 02/06/2016 1923   UROBILINOGEN 0.2 01/04/2012 0246   NITRITE NEGATIVE 02/06/2016 1923   LEUKOCYTESUR NEGATIVE 02/06/2016 1923   Sepsis Labs: @LABRCNTIP (procalcitonin:4,lacticidven:4)  ) Recent Results (from the past 240 hour(s))  Blood Culture (routine x 2)     Status: None (Preliminary result)   Collection  Time: 02/06/16  7:14 PM  Result Value Ref Range Status   Specimen Description BLOOD BLOOD LEFT FOREARM UPPER  Final   Special Requests BOTTLES DRAWN AEROBIC AND ANAEROBIC 5ML  Final   Culture   Final    NO GROWTH 2 DAYS Performed at [facility]    Report Status PENDING  Incomplete  Blood Culture (routine x 2)  Status: None (Preliminary result)   Collection Time: 02/06/16  7:14 PM  Result Value Ref Range Status   Specimen Description BLOOD BLOOD LEFT FOREARM LOWER  Final   Special Requests BOTTLES DRAWN AEROBIC AND ANAEROBIC 5ML  Final   Culture   Final    NO GROWTH 2 DAYS Performed at [facility]    Report Status PENDING  Incomplete  Urine culture     Status: None   Collection Time: 02/06/16  7:23 PM  Result Value Ref Range Status   Specimen Description URINE, RANDOM  Final   Special Requests NONE  Final   Culture NO GROWTH Performed at [facility]   Final   Report Status 02/08/2016 FINAL  Final  MRSA PCR Screening     Status: None   Collection Time: 02/07/16  1:10 AM  Result Value Ref Range Status   MRSA by PCR NEGATIVE NEGATIVE Final    Comment:        The GeneXpert MRSA Assay (FDA approved for NASAL specimens only), is one component of a comprehensive MRSA colonization surveillance program. It is not intended to diagnose MRSA infection nor to guide or monitor treatment for MRSA infections.   Culture, sputum-assessment     Status: None   Collection Time: 02/07/16  7:40 PM  Result Value Ref Range Status   Specimen Description SPUTUM  Final   Special Requests Immunocompromised  Final   Sputum evaluation   Final    THIS SPECIMEN IS ACCEPTABLE. RESPIRATORY CULTURE REPORT TO FOLLOW.   Report Status 02/07/2016 FINAL  Final  Culture, respiratory (NON-Expectorated)     Status: None   Collection Time: 02/07/16  7:40 PM  Result Value Ref Range Status   Specimen Description SPUTUM  Final   Special Requests NONE  Final   Gram Stain   Final     RARE SQUAMOUS EPITHELIAL CELLS PRESENT RARE WBC PRESENT,BOTH PMN AND MONONUCLEAR RARE GRAM POSITIVE COCCI RARE GRAM VARIABLE ROD    Culture   Final    Consistent with normal respiratory flora. Performed at [facility]    Report Status 02/09/2016 FINAL  Final      Radiology Studies: Nm Hepatobiliary Liver Func  Result Date: 02/08/2016 CLINICAL DATA:  Right upper quadrant pain . EXAM: NUCLEAR MEDICINE HEPATOBILIARY IMAGING TECHNIQUE: Sequential images of the abdomen were obtained out to 60 minutes following intravenous administration of radiopharmaceutical. RADIOPHARMACEUTICALS:  5.1 mCi Tc-89m  Choletec IV COMPARISON:  Ultrasound 02/06/2016.  CT 02/06/2016. FINDINGS: Liver visualizes normally. Gallbladder activity is visualized, consistent with patency of cystic duct. Biliary activity passes into small bowel, consistent with patent common bile duct. IMPRESSION: Normal exam.  Gallbladder visualizes normally. Electronically Signed   By: Marcello Moores  Register   On: 02/08/2016 13:24     Scheduled Meds: . ferrous sulfate  325 mg Oral Q breakfast  . FLUoxetine  20 mg Oral Daily  . fluticasone  2 spray Each Nare Daily  . gabapentin  300 mg Oral 2 times per day  . gabapentin  600 mg Oral QHS  . insulin aspart  0-9 Units Subcutaneous Q4H  . lidocaine  2 patch Transdermal Q24H  . methadone  25 mg Oral Q8H  . piperacillin-tazobactam (ZOSYN)  IV  3.375 g Intravenous Q8H  . polyethylene glycol  17 g Oral Daily  . senna  1 tablet Oral BID  . sodium chloride flush  3 mL Intravenous Q12H  . thiamine  100 mg Oral Daily   Continuous Infusions: .  dextrose 75 mL/hr at 02/09/16 0617     LOS: 2 days   Time Spent in minutes   30 minutes  Denton Derks D.O. on 02/09/2016 at 12:06 PM  Between 7am to 7pm - Pager - [phone]  After 7pm go to [email or web address] - password [email or web address]  And look for the night coverage person covering for me after hours  [facility] Office  [phone]

## 2016-02-09 NOTE — Progress Notes (Signed)
Subjective: Her pain is much better, still sore in RUQ, trying her on some clears now.  It is her Shelly Hall.  Still having a little blood with cough.    Objective: Vital signs in last 24 hours: Temp:  [97.4 F (36.3 C)-98.8 F (37.1 C)] 98 F (36.7 C) (09/06 0510) Pulse Rate:  [65-82] 80 (09/06 0510) Resp:  [16-18] 18 (09/06 0510) BP: (130-162)/(88-109) 130/88 (09/06 0510) SpO2:  [96 %-98 %] 98 % (09/06 0510) Weight:  [79.1 kg (174 lb 6.1 oz)] 79.1 kg (174 lb 6.1 oz) (09/06 0510) Last BM Date: 02/07/16 NPO 2250 IV 400 urine recorded Afebrile, VSS Creatinine is stable, WBC 1.7 Platelets 57K Normal HIDA 02/08/16 Intake/Output from previous day: 09/05 0701 - 09/06 0700 In: 2250 [I.V.:1950; IV Piggyback:300] Out: 400 [Urine:400] Intake/Output this shift: No intake/output data recorded.  General appearance: alert, cooperative and no distress GI: soft, still sore and a little tender RUQ.    Lab Results:   Recent Labs  02/08/16 0613 02/09/16 0534  WBC 1.5* 1.7*  HGB 8.6* 8.9*  HCT 26.8* 26.5*  PLT 67* 57*    BMET  Recent Labs  02/08/16 0613 02/09/16 0534  NA 142 143  K 4.2 4.0  CL 114* 110  CO2 21* 23  GLUCOSE 134* 87  BUN 14 12  CREATININE 1.37* 1.38*  CALCIUM 8.9 8.9   PT/INR  Recent Labs  02/07/16 0207 02/08/16 0613  LABPROT 13.2 13.7  INR 1.00 1.04     Recent Labs Lab 02/06/16 1914 02/07/16 0402 02/08/16 0613 02/09/16 0534  AST 93* 85* 80* 85*  ALT 54 48 43 42  ALKPHOS 201* 176* 157* 166*  BILITOT 0.3 0.5 0.5 0.6  PROT 6.9 6.4* 6.3* 6.4*  ALBUMIN 2.9* 2.5* 2.5* 2.5*     Lipase     Component Value Date/Time   LIPASE 65 (H) 02/06/2016 1914     Studies/Results: Nm Hepatobiliary Liver Func  Result Date: 02/08/2016 CLINICAL DATA:  Right upper quadrant pain . EXAM: NUCLEAR MEDICINE HEPATOBILIARY IMAGING TECHNIQUE: Sequential images of the abdomen were obtained out to 60 minutes following intravenous administration of  radiopharmaceutical. RADIOPHARMACEUTICALS:  5.1 mCi Tc-22m  Choletec IV COMPARISON:  Ultrasound 02/06/2016.  CT 02/06/2016. FINDINGS: Liver visualizes normally. Gallbladder activity is visualized, consistent with patency of cystic duct. Biliary activity passes into small bowel, consistent with patent common bile duct. IMPRESSION: Normal exam.  Gallbladder visualizes normally. Electronically Signed   By: Marcello Moores  Register   On: 02/08/2016 13:24   Prior to Admission medications   Medication Sig Start Date End Date Taking? Authorizing Provider  amphetamine-dextroamphetamine (ADDERALL) 10 MG tablet Take 1 tablet (10 mg total) by mouth 2 (two) times daily with a meal. 01/18/16  Yes Mariel Aloe, MD  carvedilol (COREG) 12.5 MG tablet Take 1 tablet (12.5 mg total) by mouth 2 (two) times daily with a meal. 01/18/16  Yes Mariel Aloe, MD  ferrous sulfate 325 (65 FE) MG tablet Take 325 mg by mouth daily with breakfast.    Yes Historical Provider, MD  FLUoxetine (PROZAC) 20 MG tablet Take 20 mg by mouth daily.    Yes Historical Provider, MD  gabapentin (NEURONTIN) 300 MG capsule Take 300-600 mg by mouth 3 (three) times daily. Pt takes one capsule in the morning, one in the afternoon, and two at bedtime.   Yes Historical Provider, MD  HYDROmorphone (DILAUDID) 4 MG tablet Take 1 tablet (4 mg total) by mouth every 2 (two) hours as needed for  severe pain. 01/28/16  Yes Chauncey Cruel, MD  letrozole [facility]) 2.5 MG tablet Take 1 tablet (2.5 mg total) by mouth daily. 08/06/15  Yes Chauncey Cruel, MD  lidocaine (LIDODERM) 5 % Place 2 patches onto the skin daily. Remove & Discard patch within 12 hours or as directed by MD 01/18/16  Yes Mariel Aloe, MD  loratadine-pseudoephedrine (CLARITIN-D 24-HOUR) 10-240 MG 24 hr tablet Take 1 tablet by mouth daily as needed for allergies.    Yes Historical Provider, MD  metFORMIN (GLUCOPHAGE) 1000 MG tablet Take 1,000 mg by mouth 2 (two) times daily with a meal.    Yes Historical  Provider, MD  methadone (DOLOPHINE) 5 MG tablet Take 5 tablets (25 mg total) by mouth every 8 (eight) hours. 01/28/16  Yes Chauncey Cruel, MD  mometasone (NASONEX) 50 MCG/ACT nasal spray Place 1 spray into the nose 2 (two) times daily.   Yes Historical Provider, MD  omeprazole (PRILOSEC) 40 MG capsule Take 1 capsule (40 mg total) by mouth daily. 10/08/15  Yes Chauncey Cruel, MD  palbociclib [name]) 100 MG capsule Take 1 capsule (100 mg total) by mouth daily with breakfast. Take whole with food. 10/08/15  Yes Chauncey Cruel, MD  polyethylene glycol (MIRALAX / GLYCOLAX) packet Take 17 g by mouth daily.   Yes Historical Provider, MD  prochlorperazine (COMPAZINE) 10 MG tablet Take 1 tablet (10 mg total) by mouth every 6 (six) hours as needed for nausea or vomiting. 12/03/15  Yes Chauncey Cruel, MD  senna (SENOKOT) 8.6 MG TABS tablet Take 1 tablet (8.6 mg total) by mouth 2 (two) times daily. 01/18/16  Yes Mariel Aloe, MD  thiamine 100 MG tablet Take 1 tablet (100 mg total) by mouth daily. 01/18/16  Yes Mariel Aloe, MD    Medications: . ferrous sulfate  325 mg Oral Q breakfast  . FLUoxetine  20 mg Oral Daily  . fluticasone  2 spray Each Nare Daily  . gabapentin  300 mg Oral 2 times per day  . gabapentin  600 mg Oral QHS  . insulin aspart  0-9 Units Subcutaneous Q4H  . lidocaine  2 patch Transdermal Q24H  . methadone  25 mg Oral Q8H  . piperacillin-tazobactam (ZOSYN)  IV  3.375 g Intravenous Q8H  . polyethylene glycol  17 g Oral Daily  . senna  1 tablet Oral BID  . sodium chloride flush  3 mL Intravenous Q12H  . thiamine  100 mg Oral Daily   . dextrose 75 mL/hr at 02/09/16 [id number]    Assessment/Plan Acute cholecystitis Metastatic breast cancer with thrombocytopenia, neutropenia, and anemia chronic pain secondary to breast cancer on Methadone/Dilaudid at home Hx of sarcoid FEN:  NPO - IV fluids ID: Day 3 Levofloxacin completed  -  Zosyn  Day 4 DVT:  SCD's only  Plan:  Medical  management of the infection for now.         LOS: 2 days    Shelly Hall 02/09/2016 [phone]

## 2016-02-10 LAB — BASIC METABOLIC PANEL
Anion gap: 10 (ref 5–15)
BUN: 10 mg/dL (ref 6–20)
CO2: 24 mmol/L (ref 22–32)
Calcium: 8.8 mg/dL — ABNORMAL LOW (ref 8.9–10.3)
Chloride: 110 mmol/L (ref 101–111)
Creatinine, Ser: 1.48 mg/dL — ABNORMAL HIGH (ref 0.44–1.00)
GFR, EST AFRICAN AMERICAN: 48 mL/min — AB (ref >60)
GFR, EST NON AFRICAN AMERICAN: 41 mL/min — AB (ref >60)
Glucose, Bld: 153 mg/dL — ABNORMAL HIGH (ref 65–99)
POTASSIUM: 3.6 mmol/L (ref 3.5–5.1)
SODIUM: 144 mmol/L (ref 135–145)

## 2016-02-10 LAB — CBC
HCT: 29.3 % — ABNORMAL LOW (ref 36.0–46.0)
HEMOGLOBIN: 9.6 g/dL — AB (ref 12.0–15.0)
MCH: 30.9 pg (ref 26.0–34.0)
MCHC: 32.8 g/dL (ref 30.0–36.0)
MCV: 94.2 fL (ref 78.0–100.0)
PLATELETS: 50 10*3/uL — AB (ref 150–400)
RBC: 3.11 MIL/uL — ABNORMAL LOW (ref 3.87–5.11)
RDW: 19.3 % — ABNORMAL HIGH (ref 11.5–15.5)
WBC: 2.2 10*3/uL — AB (ref 4.0–10.5)

## 2016-02-10 LAB — GLUCOSE, CAPILLARY
GLUCOSE-CAPILLARY: 92 mg/dL (ref 65–99)
GLUCOSE-CAPILLARY: 186 mg/dL — AB (ref 65–99)
GLUCOSE-CAPILLARY: 176 mg/dL — AB (ref 65–99)
GLUCOSE-CAPILLARY: 142 mg/dL — AB (ref 65–99)
Glucose-Capillary: 99 mg/dL (ref 65–99)
Glucose-Capillary: 149 mg/dL — ABNORMAL HIGH (ref 65–99)

## 2016-02-10 NOTE — Progress Notes (Signed)
Subjective: Still tender on palpation or deep breathing in the right upper quadrant. She did well with full liquids, and seems more comfortable. No nausea or vomiting.  Objective: Vital signs in last 24 hours: Temp:  [97.9 F (36.6 C)-98.6 F (37 C)] 98.6 F (37 C) (09/07 0423) Pulse Rate:  [71-84] 72 (09/07 0811) Resp:  [18-19] 18 (09/07 0423) BP: (136-163)/(87-103) 152/90 (09/07 0811) SpO2:  [97 %-100 %] 100 % (09/07 0423) Weight:  [77 kg (169 lb 12.8 oz)] 77 kg (169 lb 12.8 oz) (09/07 0423) Last BM Date: 02/09/16 240 po VOIDED X 6 STOOL X 4 Afebrile, VSS BP up WBC is up, platelets down, but not much   Intake/Output from previous day: 09/06 0701 - 09/07 0700 In: 240 [P.O.:240] Out: -  Intake/Output this shift: No intake/output data recorded.  General appearance: alert, cooperative and no distress GI: Soft, tender in the right upper quadrant to palpation, positive bowel sounds, no distention.  Lab Results:   Recent Labs  02/09/16 0534 02/10/16 0536  WBC 1.7* 2.2*  HGB 8.9* 9.6*  HCT 26.5* 29.3*  PLT 57* 50*    BMET  Recent Labs  02/09/16 0534 02/10/16 0536  NA 143 144  K 4.0 3.6  CL 110 110  CO2 23 24  GLUCOSE 87 153*  BUN 12 10  CREATININE 1.38* 1.48*  CALCIUM 8.9 8.8*   PT/INR  Recent Labs  02/08/16 0613  LABPROT 13.7  INR 1.04     Recent Labs Lab 02/06/16 1914 02/07/16 0402 02/08/16 0613 02/09/16 0534  AST 93* 85* 80* 85*  ALT 54 48 43 42  ALKPHOS 201* 176* 157* 166*  BILITOT 0.3 0.5 0.5 0.6  PROT 6.9 6.4* 6.3* 6.4*  ALBUMIN 2.9* 2.5* 2.5* 2.5*     Lipase     Component Value Date/Time   LIPASE 65 (H) 02/06/2016 1914     Studies/Results: Nm Hepatobiliary Liver Func  Result Date: 02/08/2016 CLINICAL DATA:  Right upper quadrant pain . EXAM: NUCLEAR MEDICINE HEPATOBILIARY IMAGING TECHNIQUE: Sequential images of the abdomen were obtained out to 60 minutes following intravenous administration of radiopharmaceutical.  RADIOPHARMACEUTICALS:  5.1 mCi Tc-28m  Choletec IV COMPARISON:  Ultrasound 02/06/2016.  CT 02/06/2016. FINDINGS: Liver visualizes normally. Gallbladder activity is visualized, consistent with patency of cystic duct. Biliary activity passes into small bowel, consistent with patent common bile duct. IMPRESSION: Normal exam.  Gallbladder visualizes normally. Electronically Signed   By: Marcello Moores  Register   On: 02/08/2016 13:24    Medications: . ferrous sulfate  325 mg Oral Q breakfast  . FLUoxetine  20 mg Oral Daily  . fluticasone  2 spray Each Nare Daily  . gabapentin  300 mg Oral 2 times per day  . gabapentin  600 mg Oral QHS  . insulin aspart  0-9 Units Subcutaneous Q4H  . lidocaine  2 patch Transdermal Q24H  . methadone  25 mg Oral Q8H  . piperacillin-tazobactam (ZOSYN)  IV  3.375 g Intravenous Q8H  . polyethylene glycol  17 g Oral Daily  . senna  1 tablet Oral BID  . sodium chloride flush  3 mL Intravenous Q12H  . thiamine  100 mg Oral Daily   . dextrose 75 mL/hr at 02/10/16 0809    Assessment/Plan Acute cholecystitis Metastatic breast cancer with thrombocytopenia, neutropenia, and anemia chronic pain secondary to breast cancer on Methadone/Dilaudid at home Hx of sarcoid FEN:  Full liquids/IV fluids  ID: Day 3 Levofloxacin completed  -  Zosyn  Day 5  DVT:  SCD's only  Plan: Advance to a low-fat soft diet and continue antibiotics. No surgery planned at this time.       LOS: 3 days    Shelly Hall 02/10/2016 [phone]

## 2016-02-10 NOTE — Progress Notes (Signed)
PROGRESS NOTE    Shelly Hall  [name] DOB: February 06, 1971 DOA: 02/06/2016 PCP: Elyn Peers, MD   Chief Complaint: RUQ pain, dyspnea, fatigue    Brief Narrative:  HPI on 02/07/2016 by Dr. Christia Reading Opyd Shelly Hall is a 45 y.o. 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 Patient is currently under the care of oncology for treatment of her metastatic breast cancer. She was recently admitted for pain control and was doing somewhat better back at [facility] when she noted the insidious development of lethargy, worsening dyspnea, and right upper quadrant abdominal pain approximately 3 days ago. She reports the pain is severe, worse with deep inspiration or cough, and localized to the right upper quadrant. She is unable to say whether a meal worsens this she has not had an appetite during this illness. She has been taking 25 mg of methadone 3 times daily, in addition to 4 mg oral Dilaudid every 4 hours at [facility]. Despite these measures, pain became severe and unmanageable. She presents for evaluation of this.   Assessment & Plan   Severe sepsis, suspected secondary to acute cholecystitis  -Presents with leukopenia, tachycardia, AKI, elevated lactate, and possible acute cholecystitis  -Blood cultures show no growth to date -Urine culture shows no growth -Empiric treatment with Zosyn initiated; vancomycin and Levaquin were added for possible HCAP as below Currently continue Zosyn and Levaquin, discontinue vancomycin and levaquin. MRSA PCR negative. -CXR with bronchitic changes; CTA chest with mets vs PNA in RUL -Gen surgery consulted and appreciated, does not appear to a surgical candidate due to neutropenia and thrombocytopenia. Per patient, she states she  is not getting a drain -HIDA: normal exam -tolerated full liquid diet, advanced to heart healthy today by surgery  Pancytopenia- Leukopenia, thrombocytopenia and anemia -H&H stable after 1 PRBC on admission. Hemoglobin currently 9.6 -WBC 900 and platelets 51,000 on admission; both indices worse from 02/02/16 -Could be chemotherapy-induced, but there is concern for sepsis as above  -Currently platelets 50 -No suggestion of active bleed at time of admission  -Type and screen requested; DIC panel negative -Maintain neutropenic precautions -Hold Ibrance for now  Acute kidney injury -SCr 1.41 on admission (baseline around 1.2-1.3- was 1.7 prior to admission) -Likely a prerenal azotemia in setting of sepsis and poor oral intake  -Was placed on IVF -Currently Creatinine 1.48, continue to monitor BMP  Dyspnea/hypoxia  -Dyspnea has been chronic -CXR with bronchitic changes; CTA neg for PE, but notable for nodular densities in RUL that are likely PNA, versus sarcoid or metastases  -Continue antibiotics  -Sputum culture and gram stain positive for gram-positive cocci -Negative urine for strep pneumo and legionella antigens  -Monitor with continuous pulse oximetry and titrate FiO2 to maintain sat >92%   Cancer of left breast, widely metastatic  -Diagnosed in Spring of 2015 -Has known widespread osseous metastatic disease -Appears to be widespread liver metastases, and possible lung and mediastinal involvement, on imaging obtained in ED  -oncology made aware of patient's admission. Spoke with Dr. Jana Hakim, patient is not a candidate for hospice as she continues to want treatment.   Shelly Hall held for now given leukopenia and thrombocytopenia -Letrazole on hold for now given the suspected infection  Diabetes Mellitus, type II complicated with episodes of hypoglycemia -No recent A1c on file,  was 6.3% in 2011  -Hold metformin -Check CBG q4h for now while not eating  -Currently on D10  drip.  Chronic cancer pain -Under better control since recent admission -Managed at [facility] with 25 mg methadone q8h, Dilaudid 4 mg q4h prn breakthrough pain, and Lidoderm patches -She is currently NPO but plan to resume methadone when she is appropriate for a diet  -For now, continue Dilaudid 1.5 mg IV q2h prn  -Palliative consultation requested  DVT Prophylaxis  SCDs  Code Status: DNR  Family Communication: Friend at bedside  Disposition Plan: Admitted. Continue current treatment.   Consultants General surgery Palliative care Oncology  Procedures  HIDA  Antibiotics   Anti-infectives    Start     Dose/Rate Route Frequency Ordered Stop   02/07/16 0600  piperacillin-tazobactam (ZOSYN) IVPB 3.375 g     3.375 g 12.5 mL/hr over 240 Minutes Intravenous Every 8 hours 02/07/16 0536     02/07/16 0130  levofloxacin (LEVAQUIN) IVPB 750 mg  Status:  Discontinued     750 mg 100 mL/hr over 90 Minutes Intravenous Daily at bedtime 02/07/16 0112 02/09/16 0907   02/07/16 0130  vancomycin (VANCOCIN) IVPB 750 mg/150 ml premix  Status:  Discontinued     750 mg 150 mL/hr over 60 Minutes Intravenous 2 times daily 02/07/16 0129 02/07/16 1544   02/07/16 0115  azithromycin (ZITHROMAX) 500 mg in dextrose 5 % 250 mL IVPB  Status:  Discontinued     500 mg 250 mL/hr over 60 Minutes Intravenous Every 24 hours 02/07/16 0106 02/07/16 0108   02/06/16 2115  piperacillin-tazobactam (ZOSYN) IVPB 3.375 g     3.375 g 100 mL/hr over 30 Minutes Intravenous  Once 02/06/16 2103 02/06/16 2208      Subjective:   Shelly Hall seen and examined today.  Patient states she was able to tolerate liquids yesterday. She still has does endorse that she is currently hungry and would like to eat something today. She has mild abdominal pain. Denies nausea or vomiting, chest pain, shortness of breath, dizziness or headache, diarrhea or constipation.    Objective:   Vitals:   02/09/16 2100 02/10/16 0423 02/10/16 0811  02/10/16 1000  BP: (!) 158/87 (!) 163/103 (!) 152/90 (!) 144/88  Pulse: 74 71 72 78  Resp: 18 18  18   Temp: 98.3 F (36.8 C) 98.6 F (37 C)  97.5 F (36.4 C)  TempSrc: Oral Oral  Oral  SpO2: 100% 100%  98%  Weight:  77 kg (169 lb 12.8 oz)    Height:        Intake/Output Summary (Last 24 hours) at 02/10/16 1251 Last data filed at 02/09/16 1541  Gross per 24 hour  Intake              240 ml  Output                0 ml  Net              240 ml   Filed Weights   02/08/16 0428 02/09/16 0510 02/10/16 0423  Weight: 79 kg (174 lb 2.6 oz) 79.1 kg (174 lb 6.1 oz) 77 kg (169 lb 12.8 oz)    Exam  General: Well developed, well nourished, NAD, appears stated age  HEENT: NCAT, mucous membranes moist.   Cardiovascular: S1 S2 auscultated, 2/6 SEM, RRR  Respiratory: Clear to auscultation bilaterally with equal chest rise  Abdomen: Soft, RUQ tenderness, nondistended, + bowel sounds  Extremities: warm dry without  cyanosis clubbing or edema  Neuro: AAOx3, nonfocal  Psych: Normal affect and demeanor with intact judgement and insight, pleasant   Data Reviewed: I have personally reviewed following labs and imaging studies  CBC:  Recent Labs Lab 02/06/16 1914 02/07/16 0207 02/07/16 0402 02/08/16 [id number] 02/09/16 0534 02/10/16 0536  WBC 0.9*  --  1.9* 1.5* 1.7* 2.2*  NEUTROABS 0.8*  --   --  1.2* 1.3*  --   HGB 14.4  --  6.9* 8.6* 8.9* 9.6*  HCT 43.6  --  21.2* 26.8* 26.5* 29.3*  MCV 96.5  --  96.8 95.4 93.6 94.2  PLT 51* 74* 74* 67* 57* 50*   Basic Metabolic Panel:  Recent Labs Lab 02/06/16 1914 02/07/16 0402 02/08/16 0613 02/09/16 0534 02/10/16 0536  NA 138 141 142 143 144  K 4.9 5.0 4.2 4.0 3.6  CL 108 112* 114* 110 110  CO2 23 23 21* 23 24  GLUCOSE 114* 88 134* 87 153*  BUN 24* 21* 14 12 10   CREATININE 1.41* 1.37* 1.37* 1.38* 1.48*  CALCIUM 9.3 8.6* 8.9 8.9 8.8*  MG  --   --  1.7 1.6*  --    GFR: Estimated Creatinine Clearance: 46.1 mL/min (by C-G formula  based on SCr of 1.48 mg/dL). Liver Function Tests:  Recent Labs Lab 02/06/16 1914 02/07/16 0402 02/08/16 0613 02/09/16 0534  AST 93* 85* 80* 85*  ALT 54 48 43 42  ALKPHOS 201* 176* 157* 166*  BILITOT 0.3 0.5 0.5 0.6  PROT 6.9 6.4* 6.3* 6.4*  ALBUMIN 2.9* 2.5* 2.5* 2.5*    Recent Labs Lab 02/06/16 1914  LIPASE 65*   No results for input(s): AMMONIA in the last 168 hours. Coagulation Profile:  Recent Labs Lab 02/07/16 0207 02/08/16 0613  INR 1.00 1.04   Cardiac Enzymes: No results for input(s): CKTOTAL, CKMB, CKMBINDEX, TROPONINI in the last 168 hours. BNP (last 3 results) No results for input(s): PROBNP in the last 8760 hours. HbA1C: No results for input(s): HGBA1C in the last 72 hours. CBG:  Recent Labs Lab 02/09/16 2110 02/10/16 0151 02/10/16 0419 02/10/16 0745 02/10/16 1146  GLUCAP 116* 92 99 186* 176*   Lipid Profile: No results for input(s): CHOL, HDL, LDLCALC, TRIG, CHOLHDL, LDLDIRECT in the last 72 hours. Thyroid Function Tests: No results for input(s): TSH, T4TOTAL, FREET4, T3FREE, THYROIDAB in the last 72 hours. Anemia Panel: No results for input(s): VITAMINB12, FOLATE, FERRITIN, TIBC, IRON, RETICCTPCT in the last 72 hours. Urine analysis:    Component Value Date/Time   COLORURINE YELLOW 02/06/2016 1923   APPEARANCEUR CLEAR 02/06/2016 1923   LABSPEC 1.010 02/06/2016 1923   PHURINE 5.5 02/06/2016 1923   GLUCOSEU NEGATIVE 02/06/2016 1923   HGBUR NEGATIVE 02/06/2016 1923   BILIRUBINUR NEGATIVE 02/06/2016 1923   KETONESUR NEGATIVE 02/06/2016 1923   PROTEINUR NEGATIVE 02/06/2016 1923   UROBILINOGEN 0.2 01/04/2012 0246   NITRITE NEGATIVE 02/06/2016 1923   LEUKOCYTESUR NEGATIVE 02/06/2016 1923   Sepsis Labs: @LABRCNTIP (procalcitonin:4,lacticidven:4)  ) Recent Results (from the past 240 hour(s))  Blood Culture (routine x 2)     Status: None (Preliminary result)   Collection Time: 02/06/16  7:14 PM  Result Value Ref Range Status   Specimen  Description BLOOD BLOOD LEFT FOREARM UPPER  Final   Special Requests BOTTLES DRAWN AEROBIC AND ANAEROBIC 5ML  Final   Culture   Final    NO GROWTH 3 DAYS Performed at [facility]    Report Status PENDING  Incomplete  Blood Culture (routine  x 2)     Status: None (Preliminary result)   Collection Time: 02/06/16  7:14 PM  Result Value Ref Range Status   Specimen Description BLOOD BLOOD LEFT FOREARM LOWER  Final   Special Requests BOTTLES DRAWN AEROBIC AND ANAEROBIC 5ML  Final   Culture   Final    NO GROWTH 3 DAYS Performed at [facility]    Report Status PENDING  Incomplete  Urine culture     Status: None   Collection Time: 02/06/16  7:23 PM  Result Value Ref Range Status   Specimen Description URINE, RANDOM  Final   Special Requests NONE  Final   Culture NO GROWTH Performed at [facility]   Final   Report Status 02/08/2016 FINAL  Final  MRSA PCR Screening     Status: None   Collection Time: 02/07/16  1:10 AM  Result Value Ref Range Status   MRSA by PCR NEGATIVE NEGATIVE Final    Comment:        The GeneXpert MRSA Assay (FDA approved for NASAL specimens only), is one component of a comprehensive MRSA colonization surveillance program. It is not intended to diagnose MRSA infection nor to guide or monitor treatment for MRSA infections.   Culture, sputum-assessment     Status: None   Collection Time: 02/07/16  7:40 PM  Result Value Ref Range Status   Specimen Description SPUTUM  Final   Special Requests Immunocompromised  Final   Sputum evaluation   Final    THIS SPECIMEN IS ACCEPTABLE. RESPIRATORY CULTURE REPORT TO FOLLOW.   Report Status 02/07/2016 FINAL  Final  Culture, respiratory (NON-Expectorated)     Status: None   Collection Time: 02/07/16  7:40 PM  Result Value Ref Range Status   Specimen Description SPUTUM  Final   Special Requests NONE  Final   Gram Stain   Final    RARE SQUAMOUS EPITHELIAL CELLS PRESENT RARE WBC PRESENT,BOTH PMN  AND MONONUCLEAR RARE GRAM POSITIVE COCCI RARE GRAM VARIABLE ROD    Culture   Final    Consistent with normal respiratory flora. Performed at [facility]    Report Status 02/09/2016 FINAL  Final      Radiology Studies: Nm Hepatobiliary Liver Func  Result Date: 02/08/2016 CLINICAL DATA:  Right upper quadrant pain . EXAM: NUCLEAR MEDICINE HEPATOBILIARY IMAGING TECHNIQUE: Sequential images of the abdomen were obtained out to 60 minutes following intravenous administration of radiopharmaceutical. RADIOPHARMACEUTICALS:  5.1 mCi Tc-41m  Choletec IV COMPARISON:  Ultrasound 02/06/2016.  CT 02/06/2016. FINDINGS: Liver visualizes normally. Gallbladder activity is visualized, consistent with patency of cystic duct. Biliary activity passes into small bowel, consistent with patent common bile duct. IMPRESSION: Normal exam.  Gallbladder visualizes normally. Electronically Signed   By: Marcello Moores  Register   On: 02/08/2016 13:24     Scheduled Meds: . ferrous sulfate  325 mg Oral Q breakfast  . FLUoxetine  20 mg Oral Daily  . fluticasone  2 spray Each Nare Daily  . gabapentin  300 mg Oral 2 times per day  . gabapentin  600 mg Oral QHS  . insulin aspart  0-9 Units Subcutaneous Q4H  . lidocaine  2 patch Transdermal Q24H  . methadone  25 mg Oral Q8H  . piperacillin-tazobactam (ZOSYN)  IV  3.375 g Intravenous Q8H  . polyethylene glycol  17 g Oral Daily  . senna  1 tablet Oral BID  . sodium chloride flush  3 mL Intravenous Q12H  . thiamine  100 mg  Oral Daily   Continuous Infusions: . dextrose 75 mL/hr at 02/10/16 0809     LOS: 3 days   Time Spent in minutes   30 minutes  Brihany Butch D.O. on 02/10/2016 at 12:51 PM  Between 7am to 7pm - Pager - [phone]  After 7pm go to [email or web address] - password [email or web address]  And look for the night coverage person covering for me after hours  [facility] Office  [phone]

## 2016-02-10 NOTE — Progress Notes (Signed)
Pharmacy Antibiotic Note  Shelly Hall is a 45 y.o. female admitted on 02/06/2016 with sepsis, suspected 2/2 acute cholecystitis, not currently a surgical candidate.  Pharmacy has been following for Zosyn dosing.  Plan: Continue Zosyn 3.375g IV q8h (4 hour infusion time).  Patient's SCr at baseline.  Current dosage is appropriate and need for further dosage adjustment appears unlikely at present. Will sign off at this time.  Please reconsult if a change in clinical status warrants re-evaluation of dosage.   Height: 5\' 3"  (160 cm) Weight: 169 lb 12.8 oz (77 kg) IBW/kg (Calculated) : 52.4  Temp (24hrs), Avg:98.1 F (36.7 C), Min:97.5 F (36.4 C), Max:98.6 F (37 C)   Recent Labs Lab 02/06/16 1914 02/06/16 2004 02/06/16 2219 02/07/16 0207 02/07/16 0402 02/08/16 [id number] 02/09/16 0534 02/10/16 0536  WBC 0.9*  --   --   --  1.9* 1.5* 1.7* 2.2*  CREATININE 1.41*  --   --   --  1.37* 1.37* 1.38* 1.48*  LATICACIDVEN  --  2.96* 2.03* 2.1* 1.7  --   --   --     Estimated Creatinine Clearance: 46.1 mL/min (by C-G formula based on SCr of 1.48 mg/dL).    Allergies  Allergen Reactions  . Nyquil Multi-Symptom [Pseudoeph-Doxylamine-Dm-Apap] Itching and Other (See Comments)    Reaction:  Skin peeling on hands/feet     Antimicrobials this admission: Vancomycin 9/4 >> 9/4 Zosyn 9/3 >> Levofloxacin 9/4 >> 9/6  Dose adjustments this admission:  Microbiology results: 9/3 Urine strep/legionella: neg/neg 9/3 BCx: ngtd 9/3 UCx: NGF 9/4 Sputum: normal resp flora 9/4 MRSA PCR: (-) 9/4 HIV Ab: non-reactive  Thank you for allowing pharmacy to be a part of this patient's care.  Hershal Coria 02/10/2016 10:40 AM

## 2016-02-11 LAB — CULTURE, BLOOD (ROUTINE X 2)
Culture: NO GROWTH
Culture: NO GROWTH

## 2016-02-11 LAB — GLUCOSE, CAPILLARY
Glucose-Capillary: 107 mg/dL — ABNORMAL HIGH (ref 65–99)
Glucose-Capillary: 107 mg/dL — ABNORMAL HIGH (ref 65–99)
Glucose-Capillary: 136 mg/dL — ABNORMAL HIGH (ref 65–99)
Glucose-Capillary: 222 mg/dL — ABNORMAL HIGH (ref 65–99)

## 2016-02-11 MED ORDER — AMOXICILLIN-POT CLAVULANATE 875-125 MG PO TABS: 1.0000 | ORAL_TABLET | Freq: Two times a day (BID) | ORAL | 0 refills | Status: DC

## 2016-02-11 MED ORDER — HYDRALAZINE HCL 20 MG/ML IJ SOLN
5.0000 mg | Freq: Once | INTRAMUSCULAR | Status: AC
  Administered 2016-02-11: 5 mg via INTRAVENOUS
  Filled 2016-02-11: qty 1

## 2016-02-11 MED FILL — AMOX-CLAV 875-125 MG TABLET: 875-125 | 7 days supply | Qty: 14 | Fill #0

## 2016-02-11 NOTE — Care Management Note (Signed)
Case Management Note  Patient Details  Name: Shelly Hall MRN: [id number] Date of Birth: 1970/12/29  Subjective/Objective:                    Action/Plan:d/c home no needs or orders.   Expected Discharge Date:                Expected Discharge Plan:  Home/Self Care  In-House Referral:     Discharge planning Services  CM Consult  Post Acute Care Choice:    Choice offered to:     DME Arranged:    DME Agency:     HH Arranged:    [place] Agency:     Status of Service:  Completed, signed off  If discussed at [employer] of Stay Meetings, dates discussed:    Additional Comments:  Dessa Phi, RN 02/11/2016, 10:50 AM

## 2016-02-11 NOTE — Discharge Summary (Signed)
Physician Discharge Summary  Shelly Hall [id number] DOB: May 05, 1971 DOA: 02/06/2016  PCP: Elyn Peers, MD  Admit date: 02/06/2016 Discharge date: 02/11/2016  Time spent:  minutes  Recommendations for Outpatient Follow-up:  Patient will be discharged to home.  Patient will need to follow up with primary care provider within one week of discharge, repeat BMP in one week.  Follow up with Dr. Jana Hakim in 1-2 weeks. Patient should continue medications as prescribed.  Patient should follow a heart healthy/carb modified diet.   Discharge Diagnoses:  Severe sepsis, suspected secondary to acute cholecystitis  Pancytopenia- Leukopenia, thrombocytopenia and anemia Acute kidney injury Dyspnea/hypoxia  Cancer of left breast, widely metastatic  Diabetes Mellitus, type II complicated with episodes of hypoglycemia Chronic cancer pain  Discharge Condition: Stable  Diet recommendation: Heart healthy/carb modified  Filed Weights   02/09/16 0510 02/10/16 0423 02/11/16 0432  Weight: 79.1 kg (174 lb 6.1 oz) 77 kg (169 lb 12.8 oz) 77 kg (169 lb 12.8 oz)    History of present illness:  on 02/07/2016 by Dr. Christia Reading Opyd [name] medical history significant forcancer of the left breast with metastases to bone, depression, type 2 diabetes mellitus, GERD, and chronic cancer related pain who presents to the emergency department for evaluation of lethargy, dyspnea, and right upper quadrant abdominal pain. Patient is currently under the care of oncology for treatment of her metastatic breast cancer. She was recently admitted for pain control and was doing somewhat better back at home when she noted the insidious development of lethargy, worsening dyspnea, and right upper quadrant abdominal pain approximately 3 days ago. She reports the pain is severe, worse with deep inspiration or cough, and localized to the right upper quadrant. She is unable to say whether a meal worsens  this she has not had an appetite during this illness. She has been taking 25 mg of methadone 3 times daily, in addition to 4 mg oral Dilaudid every 4 hours at home. Despite these measures, pain became severe and unmanageable. She presents for evaluation of this.  Hospital Course:  Severe sepsis, suspected secondary to acute cholecystitis  -Presents with leukopenia, tachycardia, AKI, elevated lactate, and possible acute cholecystitis  -Blood cultures show no growth to date -Urine culture shows no growth -Empiric treatment with Zosyn initiated; vancomycin and Levaquin were added for possible HCAP as below Currently continue Zosyn, discontinued vancomycin and levaquin. MRSA PCR negative. -CXR with bronchitic changes; CTA chest with mets vs PNA in RUL -Gen surgery consulted and appreciated, does not appear to a surgical candidate due to neutropenia and thrombocytopenia. Per patient, she states she is not getting a drain -HIDA: normal exam -tolerated diet with no nausea or vomiting -Will discharge with Augmentin  Pancytopenia- Leukopenia, thrombocytopenia and anemia -H&H stable after 1 PRBC on admission. Hemoglobin currently 9.6 -WBC 900 and platelets 51,000 on admission; both indices worse from 02/02/16 -Could be chemotherapy-induced, but there is concern for sepsis as above  -Currently platelets 50 -No suggestion of active bleed at time of admission  -Type and screen requested; DIC panel negative -Maintain neutropenic precautions -Continue to hold Ibrance until patient follows up with Dr. Jana Hakim  Acute kidney injury -SCr 1.41 on admission (baseline around 1.2-1.3- was 1.7 prior to admission) -Likely a prerenal azotemia in setting of sepsis and poor oral intake  -Was placed on IVF -Currently Creatinine 1.48 -Repeat BMP in one week  Dyspnea/hypoxia  -Dyspnea has been chronic -CXR with bronchitic changes; CTA neg for PE, but  notable for nodular densities in RUL that are likely PNA,  versus sarcoid or metastases  -Continue antibiotics  -Sputum culture and gram stain positive for gram-positive cocci -Negativeurine for strep pneumo and legionella antigens  -Monitor with continuous pulse oximetry and titrate FiO2 to maintain sat >92%   Cancer of left breast, widely metastatic  -Diagnosed in Spring of 2015 -Has known widespread osseous metastatic disease -Appears to be widespread liver metastases, and possible lung and mediastinal involvement, on imaging obtained in ED  -oncology made aware of patient's admission. Spoke with Dr. Jana Hakim, patient is not a candidate for hospice as she continues to want treatment.   [name] held for now given leukopenia and thrombocytopenia -Spoke with Dr. Jana Hakim, ok to restart Letrazole on discharge. Continue to hold Ibrance  Diabetes Mellitus, type II complicated with episodes of hypoglycemia -No recent A1c on file, was 6.3% in 2011  -Hold metformin, continue at discharge -Was placed on ISS and CBG monitoring during hospitalization  Chronic cancer pain -Under better control since recent admission -Managed at home with 25 mg methadone q8h, Dilaudid 4 mg q4h prn breakthrough pain, and Lidoderm patches -Continue home pain regimen at discharge -Palliative consultation requested. Hospice not an option at this time as patient wants to continue therapy.   Consultants General surgery Palliative care Oncology  Procedures  HIDA  Discharge Exam: Vitals:   02/11/16 0429 02/11/16 0722  BP: (!) 162/98 (!) 140/91  Pulse: 79 82  Resp: 20 18  Temp: 98.5 F (36.9 C) 98 F (36.7 C)   Exam  General: Well developed, well nourished, NAD  HEENT: NCAT, mucous membranes moist.   Cardiovascular: S1 S2 auscultated, 2/6 SEM, RRR  Respiratory: Clear to auscultation bilaterally with equal chest rise  Abdomen: Soft, mild RUQ tenderness, nondistended, + bowel sounds  Extremities: warm dry without cyanosis clubbing or  edema  Neuro: AAOx3, nonfocal  Psych: Normal affect and demeanor with intact judgement and insight, pleasant  Discharge Instructions Discharge Instructions    Discharge instructions    Complete by:  As directed   Patient will be discharged to home.  Patient will need to follow up with primary care provider within one week of discharge, repeat BMP in one week.  Follow up with Dr. Jana Hakim in 1-2 weeks. Patient should continue medications as prescribed.  Patient should follow a heart healthy/carb modified diet.     Current Discharge Medication List    START taking these medications   Details  amoxicillin-clavulanate (AUGMENTIN) 875-125 MG tablet Take 1 tablet by mouth 2 (two) times daily. Qty: 14 tablet, Refills: 0      CONTINUE these medications which have NOT CHANGED   Details  amphetamine-dextroamphetamine (ADDERALL) 10 MG tablet Take 1 tablet (10 mg total) by mouth 2 (two) times daily with a meal. Qty: 60 tablet, Refills: 0    carvedilol (COREG) 12.5 MG tablet Take 1 tablet (12.5 mg total) by mouth 2 (two) times daily with a meal. Qty: 60 tablet, Refills: 0    ferrous sulfate 325 (65 FE) MG tablet Take 325 mg by mouth daily with breakfast.     FLUoxetine (PROZAC) 20 MG tablet Take 20 mg by mouth daily.     gabapentin (NEURONTIN) 300 MG capsule Take 300-600 mg by mouth 3 (three) times daily. Pt takes one capsule in the morning, one in the afternoon, and two at bedtime.    HYDROmorphone (DILAUDID) 4 MG tablet Take 1 tablet (4 mg total) by mouth every 2 (two) hours as needed  for severe pain. Qty: 120 tablet, Refills: 0    letrozole (FEMARA) 2.5 MG tablet Take 1 tablet (2.5 mg total) by mouth daily. Qty: 90 tablet, Refills: 4    lidocaine (LIDODERM) 5 % Place 2 patches onto the skin daily. Remove & Discard patch within 12 hours or as directed by MD Qty: 30 patch, Refills: 0    loratadine-pseudoephedrine (CLARITIN-D 24-HOUR) 10-240 MG 24 hr tablet Take 1 tablet by mouth daily  as needed for allergies.     metFORMIN (GLUCOPHAGE) 1000 MG tablet Take 1,000 mg by mouth 2 (two) times daily with a meal.     methadone (DOLOPHINE) 5 MG tablet Take 5 tablets (25 mg total) by mouth every 8 (eight) hours. Qty: 225 tablet, Refills: 0    mometasone (NASONEX) 50 MCG/ACT nasal spray Place 1 spray into the nose 2 (two) times daily.    omeprazole (PRILOSEC) 40 MG capsule Take 1 capsule (40 mg total) by mouth daily. Qty: 90 capsule, Refills: 12    polyethylene glycol (MIRALAX / GLYCOLAX) packet Take 17 g by mouth daily.    prochlorperazine (COMPAZINE) 10 MG tablet Take 1 tablet (10 mg total) by mouth every 6 (six) hours as needed for nausea or vomiting. Qty: 30 tablet, Refills: 0    senna (SENOKOT) 8.6 MG TABS tablet Take 1 tablet (8.6 mg total) by mouth 2 (two) times daily. Qty: 60 each, Refills: 0    thiamine 100 MG tablet Take 1 tablet (100 mg total) by mouth daily. Qty: 30 tablet, Refills: 0      STOP taking these medications     palbociclib (IBRANCE) 100 MG capsule        Allergies  Allergen Reactions  . Nyquil Multi-Symptom [Pseudoeph-Doxylamine-Dm-Apap] Itching and Other (See Comments)    Reaction:  Skin peeling on hands/feet    Follow-up Information    BLAND,VEITA J, MD. Schedule an appointment as soon as possible for a visit in 1 week(s).   Specialty:  Family Medicine Why:  Hospital follow up Contact information: [address] [place] [address] [phone]        Chauncey Cruel, MD. Schedule an appointment as soon as possible for a visit in 1 week(s).   Specialty:  Oncology Why:  Hospital follow up Contact information: [place] [address] [phone]            The results of significant diagnostics from this hospitalization (including imaging, microbiology, ancillary and laboratory) are listed below for reference.    Significant Diagnostic Studies: Dg Chest 2 View  Result Date:  02/06/2016 CLINICAL DATA:  Productive cough and shortness of breath. History of metastatic breast cancer. EXAM: CHEST  2 VIEW COMPARISON:  PA and lateral chest 01/07/2016.  CT chest 01/17/2016. FINDINGS: Lung volumes are lower than on the prior chest film with associated basilar atelectasis. Peribronchial thickening is noted. No pneumothorax or pleural effusion. Heart size is normal. Surgical clips left axilla are noted. Extensive osseous sclerosis consistent with metastatic disease is seen. Remote fracture distal right clavicle is identified. IMPRESSION: Bronchitic change without focal process. Extensive osseous metastatic disease. Electronically Signed   By: Inge Rise M.D.   On: 02/06/2016 18:13   Dg Abd 1 View  Result Date: 01/15/2016 CLINICAL DATA:  Patient with abdominal pain, constipation and distention. EXAM: ABDOMEN - 1 VIEW COMPARISON:  CT abdomen pelvis 08/19/2015. FINDINGS: The lung bases are clear. There is a large amount of stool throughout the colon. Gas is  demonstrated within nondilated loops of large and small bowel in a nonobstructed pattern. Innumerable lucent lesions throughout the visualized skeleton. IMPRESSION: Large amount of stool throughout the colon as can be seen with constipation. Lucent lesions throughout the visualized skeleton most compatible with metastatic disease. Electronically Signed   By: Lovey Newcomer M.D.   On: 01/15/2016 10:58   Ct Angio Chest Pe W And/or Wo Contrast  Result Date: 02/06/2016 CLINICAL DATA:  45 year old female with history of breast cancer. Rule out PE. EXAM: CT ANGIOGRAPHY CHEST CT ABDOMEN AND PELVIS WITH CONTRAST TECHNIQUE: Multidetector CT imaging of the chest was performed using the standard protocol during bolus administration of intravenous contrast. Multiplanar CT image reconstructions and MIPs were obtained to evaluate the vascular anatomy. Multidetector CT imaging of the abdomen and pelvis was performed using the standard protocol during  bolus administration of intravenous contrast. CONTRAST:  100 cc Isovue 370 COMPARISON:  Chest CT dated 01/17/2016, abdominal CT dated 08/19/2015 and bone scan dated 01/10/2016 FINDINGS: CTA CHEST FINDINGS There is shallow inspiration. Minimal bibasilar dependent atelectatic changes. Diffuse airspace ground-glass density, likely related to atelectatic changes and hypovolemia. There is apparent diffuse minimal interlobular septal thickening with Kerley B-lines, likely mild pulmonary edema. Small faint nodular densities along the bronchovascular interstitium predominantly in the right upper and middle lobes noted. Although these may be inflammatory/infectious in etiology, an early pulmonary metastases or lymphangitic spread of the tumor is not entirely excluded. Clinical correlation and follow-up recommended. There is no pleural effusion or pneumothorax. The central airways are patent. The thoracic aorta appears unremarkable. The origins of the great vessels of the aortic arch appear patent. There is no CT evidence of pulmonary embolism. There is mild cardiomegaly. No pericardial effusion. There are bilateral hilar adenopathy. The largest lymph nodes measure up to 2.7 x 1.7 cm (previously 1.9 x 1.9 cm) in the right infrahilar region. Anterior mediastinal adenopathy in the prevascular space noted similar to prior study. Right posterior mediastinal/subcarinal adenopathy measures 2.3 x 2.3 cm. The esophagus is grossly unremarkable. Left thyroidectomy. Left axillary surgical clips noted. No axillary adenopathy identified. Left-sided mastectomy. The chest wall soft tissues are otherwise unremarkable. There is extensive osseous metastatic disease. There is nondisplaced fracture of the right posterior fourth and tenth ribs as seen on the prior CT, likely pathologic fracture. No new fracture. T1 and T8 compression fracture deformity similar to prior study. CT ABDOMEN and PELVIS FINDINGS No intra-abdominal free air or free  fluid. There are extensive hepatic hypodense lesions compatible with metastatic disease. There is associated irregular appearance of the hepatic contour. There multiple small stones within the gallbladder. There is thickened appearance of the gallbladder wall. Ultrasound may provide better evaluation of the gallbladder if an acute cholecystitis is clinically suspected. The pancreas, spleen, adrenal glands, kidneys, visualized ureters, and urinary bladder appear unremarkable. Hysterectomy. There is moderate stool throughout the colon. No evidence of bowel obstruction or active inflammation. Normal appendix. There is mild aortoiliac atherosclerotic disease. The origins of the celiac axis, SMA, IMA as well as the origins of the renal arteries appear patent. The the IVC, SMV, splenic vein, and main portal vein are patent. No portal venous gas identified. Upper abdominal adenopathy in the gastrohepatic space, and portacaval adenopathy. The abdominal wall soft tissues appear unremarkable. There is extensive osseous metastatic disease. There is pathologic compression fracture of the L5 vertebra with more than 50% loss of vertebral body height. There is retropulsed fracture fragment from the superior posterior corner of the L5 vertebral with  moderate focal narrowing of the central canal. There is disc desiccation with vacuum phenomena at L4-5 and L5-S1. Overall there has been interval progression of osseous metastatic disease compared to the study dated 08/19/2015. Review of the MIP images confirms the above findings. IMPRESSION: No CT evidence of pulmonary embolism. Mild atelectatic changes with probable mild pulmonary edema. Small nodular densities predominantly in the right upper and middle lobes may be inflammatory or infectious in etiology. Metastatic disease or less likely early lymphangitic spread of tumor are not excluded. Clinical correlation and follow-up recommended. Interval progression of hilar and mediastinal  adenopathy. Extensive hepatic metastatic disease. Small gallstones with diffuse gallbladder wall thickening. Ultrasound may provide better evaluation of the gallbladder if an acute cholecystitis is clinically suspected. Upper abdominal adenopathy. Extensive osseous metastatic disease with overall progression since the prior study. Multilevel compression deformities. L5 pathologic compression fracture, new from study dated 08/19/2015 with associated retropulsed fragment and focal narrowing of the central canal. Electronically Signed   By: Anner Crete M.D.   On: 02/06/2016 22:34   Ct Angio Chest Pe W Or Wo Contrast  Result Date: 01/17/2016 CLINICAL DATA:  Acute onset of shortness of breath. Tachycardia and diaphoresis. Lethargy, with right shoulder pain. Elevated D-dimer. Initial encounter. EXAM: CT ANGIOGRAPHY CHEST WITH CONTRAST TECHNIQUE: Multidetector CT imaging of the chest was performed using the standard protocol during bolus administration of intravenous contrast. Multiplanar CT image reconstructions and MIPs were obtained to evaluate the vascular anatomy. CONTRAST:  100 mL of Isovue 370 IV contrast COMPARISON:  Chest radiograph performed 01/07/2016, and CT of the chest performed 08/03/2015 FINDINGS: There is no evidence of pulmonary embolus. Minimal bilateral atelectasis is noted. A 7 mm nodule at the left lung apex is somewhat more prominent than on the prior study. Metastatic disease cannot be entirely excluded. Scattered blebs are noted at the lung apices. There is no evidence of significant focal consolidation, pleural effusion or pneumothorax. Note is made of enlarged peribronchial, hilar and mediastinal nodes. The largest peribronchial node is noted extending to the right lower lobe, measuring 1.9 cm in short axis. Enlarged bilateral hilar nodes measure up to 1.2 cm in short axis. There is a 1.8 cm azygoesophageal recess node, confluent periaortic nodes measuring up to 1.8 cm in short axis, and  a right paratracheal node measuring 1.2 cm. Per correlation with prior studies, this most likely reflects sarcoidosis. No pericardial effusion is identified. The great vessels are grossly unremarkable in appearance. No axillary lymphadenopathy is seen. The visualized portions of the thyroid gland are unremarkable in appearance. Postoperative change is noted at the left thyroid bed. Numerous vague hypodensities are seen within the liver, raising concern for metastatic disease, mostly new from the prior study. A small hypodense focus within the spleen is of uncertain significance. No acute osseous abnormalities are seen. Diffuse mixed sclerotic and lytic lesions are noted throughout the visualized osseous structures, compatible with metastatic disease. This appears somewhat worsened from the prior study. Review of the MIP images confirms the above findings. IMPRESSION: 1. No evidence of pulmonary embolus. 2. Minimal bilateral atelectasis noted. 7 mm nodule at the left lung apex is somewhat more prominent than in February. Metastatic disease cannot be entirely excluded. Scattered blebs noted at the lung apices. 3. Numerous vague hypodensities within the liver, mostly new from the prior study and raising concern for new metastatic disease. Small hypodense focus within the spleen is of uncertain significance. 4. Enlarged peribronchial, hilar and mediastinal nodes are relatively stable and likely reflect  sarcoidosis. 5. Diffuse metastatic disease to the bone again noted, with mixed sclerotic and lytic lesions throughout the visualized osseous structures. This appears somewhat worsened from the prior study. Electronically Signed   By: Garald Balding M.D.   On: 01/17/2016 18:48   Nm Hepatobiliary Liver Func  Result Date: 02/08/2016 CLINICAL DATA:  Right upper quadrant pain . EXAM: NUCLEAR MEDICINE HEPATOBILIARY IMAGING TECHNIQUE: Sequential images of the abdomen were obtained out to 60 minutes following intravenous  administration of radiopharmaceutical. RADIOPHARMACEUTICALS:  5.1 mCi Tc-61m  Choletec IV COMPARISON:  Ultrasound 02/06/2016.  CT 02/06/2016. FINDINGS: Liver visualizes normally. Gallbladder activity is visualized, consistent with patency of cystic duct. Biliary activity passes into small bowel, consistent with patent common bile duct. IMPRESSION: Normal exam.  Gallbladder visualizes normally. Electronically Signed   By: Marcello Moores  Register   On: 02/08/2016 13:24   Ct Abdomen Pelvis W Contrast  Result Date: 02/06/2016 CLINICAL DATA:  45 year old female with history of breast cancer. Rule out PE. EXAM: CT ANGIOGRAPHY CHEST CT ABDOMEN AND PELVIS WITH CONTRAST TECHNIQUE: Multidetector CT imaging of the chest was performed using the standard protocol during bolus administration of intravenous contrast. Multiplanar CT image reconstructions and MIPs were obtained to evaluate the vascular anatomy. Multidetector CT imaging of the abdomen and pelvis was performed using the standard protocol during bolus administration of intravenous contrast. CONTRAST:  100 cc Isovue 370 COMPARISON:  Chest CT dated 01/17/2016, abdominal CT dated 08/19/2015 and bone scan dated 01/10/2016 FINDINGS: CTA CHEST FINDINGS There is shallow inspiration. Minimal bibasilar dependent atelectatic changes. Diffuse airspace ground-glass density, likely related to atelectatic changes and hypovolemia. There is apparent diffuse minimal interlobular septal thickening with Kerley B-lines, likely mild pulmonary edema. Small faint nodular densities along the bronchovascular interstitium predominantly in the right upper and middle lobes noted. Although these may be inflammatory/infectious in etiology, an early pulmonary metastases or lymphangitic spread of the tumor is not entirely excluded. Clinical correlation and follow-up recommended. There is no pleural effusion or pneumothorax. The central airways are patent. The thoracic aorta appears unremarkable. The  origins of the great vessels of the aortic arch appear patent. There is no CT evidence of pulmonary embolism. There is mild cardiomegaly. No pericardial effusion. There are bilateral hilar adenopathy. The largest lymph nodes measure up to 2.7 x 1.7 cm (previously 1.9 x 1.9 cm) in the right infrahilar region. Anterior mediastinal adenopathy in the prevascular space noted similar to prior study. Right posterior mediastinal/subcarinal adenopathy measures 2.3 x 2.3 cm. The esophagus is grossly unremarkable. Left thyroidectomy. Left axillary surgical clips noted. No axillary adenopathy identified. Left-sided mastectomy. The chest wall soft tissues are otherwise unremarkable. There is extensive osseous metastatic disease. There is nondisplaced fracture of the right posterior fourth and tenth ribs as seen on the prior CT, likely pathologic fracture. No new fracture. T1 and T8 compression fracture deformity similar to prior study. CT ABDOMEN and PELVIS FINDINGS No intra-abdominal free air or free fluid. There are extensive hepatic hypodense lesions compatible with metastatic disease. There is associated irregular appearance of the hepatic contour. There multiple small stones within the gallbladder. There is thickened appearance of the gallbladder wall. Ultrasound may provide better evaluation of the gallbladder if an acute cholecystitis is clinically suspected. The pancreas, spleen, adrenal glands, kidneys, visualized ureters, and urinary bladder appear unremarkable. Hysterectomy. There is moderate stool throughout the colon. No evidence of bowel obstruction or active inflammation. Normal appendix. There is mild aortoiliac atherosclerotic disease. The origins of the celiac axis, SMA, IMA as well  as the origins of the renal arteries appear patent. The the IVC, SMV, splenic vein, and main portal vein are patent. No portal venous gas identified. Upper abdominal adenopathy in the gastrohepatic space, and portacaval adenopathy.  The abdominal wall soft tissues appear unremarkable. There is extensive osseous metastatic disease. There is pathologic compression fracture of the L5 vertebra with more than 50% loss of vertebral body height. There is retropulsed fracture fragment from the superior posterior corner of the L5 vertebral with moderate focal narrowing of the central canal. There is disc desiccation with vacuum phenomena at L4-5 and L5-S1. Overall there has been interval progression of osseous metastatic disease compared to the study dated 08/19/2015. Review of the MIP images confirms the above findings. IMPRESSION: No CT evidence of pulmonary embolism. Mild atelectatic changes with probable mild pulmonary edema. Small nodular densities predominantly in the right upper and middle lobes may be inflammatory or infectious in etiology. Metastatic disease or less likely early lymphangitic spread of tumor are not excluded. Clinical correlation and follow-up recommended. Interval progression of hilar and mediastinal adenopathy. Extensive hepatic metastatic disease. Small gallstones with diffuse gallbladder wall thickening. Ultrasound may provide better evaluation of the gallbladder if an acute cholecystitis is clinically suspected. Upper abdominal adenopathy. Extensive osseous metastatic disease with overall progression since the prior study. Multilevel compression deformities. L5 pathologic compression fracture, new from study dated 08/19/2015 with associated retropulsed fragment and focal narrowing of the central canal. Electronically Signed   By: Anner Crete M.D.   On: 02/06/2016 22:34   US Abdomen Limited Ruq  Result Date: 02/06/2016 CLINICAL DATA:  RIGHT upper quadrant pain for 3 days, history hypertension, sarcoidosis, type II diabetes mellitus, thyroid cancer, breast cancer EXAM: US ABDOMEN LIMITED - RIGHT UPPER QUADRANT COMPARISON:  CT abdomen pelvis 02/06/2016 FINDINGS: Gallbladder: Thickened gallbladder wall. Dependent  shadowing calculi. Small gallbladder polyps noted. Sonographic Murphy sign present. No definite pericholecystic fluid. Common bile duct: Diameter: 6 mm diameter, upper normal Liver: Markedly heterogeneous appearance of liver parenchyma with multiple rounded areas of hypo echogenicity compatible with widespread hepatic metastases as noted on earlier CT. Hepatopetal portal venous flow. No RIGHT upper quadrant free fluid. IMPRESSION: Thickened gallbladder wall with gallstones, gallbladder polyps, and sonographic Murphy sign suspicious for acute cholecystitis. Widespread hepatic metastatic disease. Electronically Signed   By: Lavonia Dana M.D.   On: 02/06/2016 22:50    Microbiology: Recent Results (from the past 240 hour(s))  Blood Culture (routine x 2)     Status: None   Collection Time: 02/06/16  7:14 PM  Result Value Ref Range Status   Specimen Description BLOOD BLOOD LEFT FOREARM UPPER  Final   Special Requests BOTTLES DRAWN AEROBIC AND ANAEROBIC 5ML  Final   Culture   Final    NO GROWTH 5 DAYS Performed at [facility]    Report Status 02/11/2016 FINAL  Final  Blood Culture (routine x 2)     Status: None   Collection Time: 02/06/16  7:14 PM  Result Value Ref Range Status   Specimen Description BLOOD BLOOD LEFT FOREARM LOWER  Final   Special Requests BOTTLES DRAWN AEROBIC AND ANAEROBIC 5ML  Final   Culture   Final    NO GROWTH 5 DAYS Performed at [facility]    Report Status 02/11/2016 FINAL  Final  Urine culture     Status: None   Collection Time: 02/06/16  7:23 PM  Result Value Ref Range Status   Specimen Description URINE, RANDOM  Final  Special Requests NONE  Final   Culture NO GROWTH Performed at [facility]   Final   Report Status 02/08/2016 FINAL  Final  MRSA PCR Screening     Status: None   Collection Time: 02/07/16  1:10 AM  Result Value Ref Range Status   MRSA by PCR NEGATIVE NEGATIVE Final    Comment:        The GeneXpert MRSA Assay  (FDA approved for NASAL specimens only), is one component of a comprehensive MRSA colonization surveillance program. It is not intended to diagnose MRSA infection nor to guide or monitor treatment for MRSA infections.   Culture, sputum-assessment     Status: None   Collection Time: 02/07/16  7:40 PM  Result Value Ref Range Status   Specimen Description SPUTUM  Final   Special Requests Immunocompromised  Final   Sputum evaluation   Final    THIS SPECIMEN IS ACCEPTABLE. RESPIRATORY CULTURE REPORT TO FOLLOW.   Report Status 02/07/2016 FINAL  Final  Culture, respiratory (NON-Expectorated)     Status: None   Collection Time: 02/07/16  7:40 PM  Result Value Ref Range Status   Specimen Description SPUTUM  Final   Special Requests NONE  Final   Gram Stain   Final    RARE SQUAMOUS EPITHELIAL CELLS PRESENT RARE WBC PRESENT,BOTH PMN AND MONONUCLEAR RARE GRAM POSITIVE COCCI RARE GRAM VARIABLE ROD    Culture   Final    Consistent with normal respiratory flora. Performed at [facility]    Report Status 02/09/2016 FINAL  Final     Labs: Basic Metabolic Panel:  Recent Labs Lab 02/06/16 1914 02/07/16 0402 02/08/16 0613 02/09/16 0534 02/10/16 0536  NA 138 141 142 143 144  K 4.9 5.0 4.2 4.0 3.6  CL 108 112* 114* 110 110  CO2 23 23 21* 23 24  GLUCOSE 114* 88 134* 87 153*  BUN 24* 21* 14 12 10   CREATININE 1.41* 1.37* 1.37* 1.38* 1.48*  CALCIUM 9.3 8.6* 8.9 8.9 8.8*  MG  --   --  1.7 1.6*  --    Liver Function Tests:  Recent Labs Lab 02/06/16 1914 02/07/16 0402 02/08/16 0613 02/09/16 0534  AST 93* 85* 80* 85*  ALT 54 48 43 42  ALKPHOS 201* 176* 157* 166*  BILITOT 0.3 0.5 0.5 0.6  PROT 6.9 6.4* 6.3* 6.4*  ALBUMIN 2.9* 2.5* 2.5* 2.5*    Recent Labs Lab 02/06/16 1914  LIPASE 65*   No results for input(s): AMMONIA in the last 168 hours. CBC:  Recent Labs Lab 02/06/16 1914 02/07/16 0207 02/07/16 0402 02/08/16 [id number] 02/09/16 0534 02/10/16 0536  WBC  0.9*  --  1.9* 1.5* 1.7* 2.2*  NEUTROABS 0.8*  --   --  1.2* 1.3*  --   HGB 14.4  --  6.9* 8.6* 8.9* 9.6*  HCT 43.6  --  21.2* 26.8* 26.5* 29.3*  MCV 96.5  --  96.8 95.4 93.6 94.2  PLT 51* 74* 74* 67* 57* 50*   Cardiac Enzymes: No results for input(s): CKTOTAL, CKMB, CKMBINDEX, TROPONINI in the last 168 hours. BNP: BNP (last 3 results) No results for input(s): BNP in the last 8760 hours.  ProBNP (last 3 results) No results for input(s): PROBNP in the last 8760 hours.  CBG:  Recent Labs Lab 02/10/16 1631 02/10/16 2046 02/11/16 0008 02/11/16 0418 02/11/16 0736  GLUCAP 142* 149* 107* 107* 136*       Signed:  Cristal Ford  [facility] 02/11/2016, 10:20  AM

## 2016-02-11 NOTE — Discharge Instructions (Signed)

## 2016-02-11 NOTE — Care Management Important Message (Signed)
Important Message  Patient Details  Name: Shelly Hall MRN: [id number] Date of Birth: December 11, 1970   Medicare Important Message Given:  Yes    [name] 02/11/2016, 11:10 AMImportant Message  Patient Details  Name: Shelly Hall MRN: [id number] Date of Birth: 1970-12-04   Medicare Important Message Given:  Yes    [name] 02/11/2016, 11:10 AM

## 2016-02-14 ENCOUNTER — Other Ambulatory Visit: Payer: Self-pay

## 2016-02-14 DIAGNOSIS — C50812 Malignant neoplasm of overlapping sites of left female breast: Secondary | ICD-10-CM

## 2016-02-15 ENCOUNTER — Telehealth: Payer: Self-pay

## 2016-02-15 ENCOUNTER — Other Ambulatory Visit: Payer: Self-pay | Admitting: General Surgery

## 2016-02-15 ENCOUNTER — Ambulatory Visit (HOSPITAL_BASED_OUTPATIENT_CLINIC_OR_DEPARTMENT_OTHER): Payer: Medicare Other

## 2016-02-15 ENCOUNTER — Other Ambulatory Visit (HOSPITAL_BASED_OUTPATIENT_CLINIC_OR_DEPARTMENT_OTHER): Payer: Medicare Other

## 2016-02-15 ENCOUNTER — Ambulatory Visit (HOSPITAL_BASED_OUTPATIENT_CLINIC_OR_DEPARTMENT_OTHER): Payer: Medicare Other | Admitting: Oncology

## 2016-02-15 VITALS — BP 113/81 | HR 99 | Temp 97.8°F | Resp 18 | Ht 63.0 in | Wt 172.3 lb

## 2016-02-15 DIAGNOSIS — C50812 Malignant neoplasm of overlapping sites of left female breast: Secondary | ICD-10-CM

## 2016-02-15 DIAGNOSIS — C7951 Secondary malignant neoplasm of bone: Secondary | ICD-10-CM

## 2016-02-15 DIAGNOSIS — C773 Secondary and unspecified malignant neoplasm of axilla and upper limb lymph nodes: Secondary | ICD-10-CM | POA: Diagnosis not present

## 2016-02-15 DIAGNOSIS — Z170 Estrogen receptor positive status [ER+]: Secondary | ICD-10-CM

## 2016-02-15 DIAGNOSIS — G893 Neoplasm related pain (acute) (chronic): Secondary | ICD-10-CM

## 2016-02-15 DIAGNOSIS — C50912 Malignant neoplasm of unspecified site of left female breast: Secondary | ICD-10-CM

## 2016-02-15 LAB — CBC WITH DIFFERENTIAL/PLATELET
BASO%: 0.7 % (ref 0.0–2.0)
Basophils Absolute: 0 10*3/uL (ref 0.0–0.1)
EOS%: 1.7 % (ref 0.0–7.0)
Eosinophils Absolute: 0.1 10*3/uL (ref 0.0–0.5)
HCT: 26 % — ABNORMAL LOW (ref 34.8–46.6)
HGB: 8.5 g/dL — ABNORMAL LOW (ref 11.6–15.9)
LYMPH%: 14.5 % (ref 14.0–49.7)
MCH: 31.1 pg (ref 25.1–34.0)
MCHC: 32.7 g/dL (ref 31.5–36.0)
MCV: 95.2 fL (ref 79.5–101.0)
MONO#: 0.5 10*3/uL (ref 0.1–0.9)
MONO%: 15.2 % — AB (ref 0.0–14.0)
NEUT%: 67.9 % (ref 38.4–76.8)
NEUTROS ABS: 2.1 10*3/uL (ref 1.5–6.5)
Platelets: 36 10*3/uL — ABNORMAL LOW (ref 145–400)
RBC: 2.73 10*6/uL — AB (ref 3.70–5.45)
RDW: 19.7 % — ABNORMAL HIGH (ref 11.2–14.5)
WBC: 3 10*3/uL — AB (ref 3.9–10.3)
lymph#: 0.4 10*3/uL — ABNORMAL LOW (ref 0.9–3.3)
nRBC: 2 % — ABNORMAL HIGH (ref 0–0)

## 2016-02-15 LAB — COMPREHENSIVE METABOLIC PANEL
ALT: 55 U/L (ref 0–55)
AST: 155 U/L — AB (ref 5–34)
Albumin: 2.4 g/dL — ABNORMAL LOW (ref 3.5–5.0)
Alkaline Phosphatase: 312 U/L — ABNORMAL HIGH (ref 40–150)
Anion Gap: 12 mEq/L — ABNORMAL HIGH (ref 3–11)
BUN: 12.4 mg/dL (ref 7.0–26.0)
CHLORIDE: 110 meq/L — AB (ref 98–109)
CO2: 22 meq/L (ref 22–29)
CREATININE: 1.2 mg/dL — AB (ref 0.6–1.1)
Calcium: 9 mg/dL (ref 8.4–10.4)
EGFR: 60 mL/min/{1.73_m2} — ABNORMAL LOW (ref >90)
Glucose: 121 mg/dl (ref 70–140)
POTASSIUM: 4.1 meq/L (ref 3.5–5.1)
SODIUM: 144 meq/L (ref 136–145)
Total Bilirubin: 0.44 mg/dL (ref 0.20–1.20)
Total Protein: 6.4 g/dL (ref 6.4–8.3)

## 2016-02-15 MED ORDER — AMPHETAMINE-DEXTROAMPHETAMINE 10 MG PO TABS: 10.0000 mg | ORAL_TABLET | Freq: Two times a day (BID) | ORAL | 0 refills | Status: AC

## 2016-02-15 MED ORDER — HYDROMORPHONE HCL 4 MG PO TABS: 4.0000 mg | ORAL_TABLET | ORAL | 0 refills | Status: DC | PRN

## 2016-02-15 MED ORDER — PROCHLORPERAZINE MALEATE 10 MG PO TABS
10.0000 mg | ORAL_TABLET | Freq: Four times a day (QID) | ORAL | 0 refills | Status: AC | PRN
Start: 2016-02-15 — End: ?

## 2016-02-15 MED ORDER — ZOLEDRONIC ACID 4 MG/100ML IV SOLN
4.0000 mg | Freq: Once | INTRAVENOUS | Status: AC
  Administered 2016-02-15: 4 mg via INTRAVENOUS
  Filled 2016-02-15: qty 100

## 2016-02-15 MED FILL — DEXTROAMP-AMP 10 MG TAB: 10 | 30 days supply | Qty: 60 | Fill #0

## 2016-02-15 MED FILL — HYDROmorphone HCL 4 MG TABS: 4 | 15 days supply | Qty: 120 | Fill #0

## 2016-02-15 MED FILL — PROCHLORPERAZINE 10 MG TAB: 10 | 7 days supply | Qty: 30 | Fill #0

## 2016-02-15 NOTE — Telephone Encounter (Signed)
Fax from [facility] asking for verification of need for wheelchair to renew the rental. OV note form 01/16/16 faxed to [facility].

## 2016-02-15 NOTE — Progress Notes (Signed)
[place]  [phone] [phone]    ID: Shelly Hall OB: Nov 23, 1970  MR#: [id number]  RDE#:[id number]  PCP: Elyn Peers, MD GYN:  [name] SU: [name] OTHER MD: Thea Silversmith  CHIEF COMPLAINT:  Left Breast Cancer, estrogen receptor positive  CURRENT TREATMENT: eribulin, zolendronate  BREAST CANCER HISTORY: From the original intake note:  Shelly Hall palpated a mass in her left breast mid-April 2015 and immediately brought it to Dr. Benjie Karvonen' attention. She was set up for bilateral screening mammography with tomography at [facility] 09/19/2013. The possible distortion in the left breast and a nearby group of calcifications was felt to warrant further evaluation, and on 09/30/2013 she underwent unilateral left diagnostic mammography and ultrasonography. There was an irregular mass in the outer left breast associated with microcalcifications. 4 cm anterior to this there was another cluster of pleomorphic calcifications spanning 7 mm. He had more anteriorly there was an irregular mass associated with other calcifications. In total the abnormalities in the left breast spent approximately 10 cm, and is segmental distribution. On exam there was a palpable firm mass at the 2:30 o'clock position of the left breast 10 cm from the nipple. There was palpable adenopathy in the inferior left axilla. Ultrasound confirmed an irregular hypoechoic mass in the left breast measuring 4.6 cm. In addition, a 1.4 cm oval slightly irregular mass was noted at the 3:00 position and yet another mass measured 9 mm in the same area. Ultrasound of the left axilla showed a dominant 3.6 cm lymph node.  Biopsy of 2 of the left breast masses and the suspicious left breast lymph node on 10/03/2013 showed ([id number]) an invasive ductal carcinoma, grade 2, estrogen receptor 90% positive with strong staining intensity, progesterone receptor 40% positive but moderate staining  intensity, with an MIB-1 of 20% and no HER-2 amplification. (Because all 3 biopsies were morphologically identical, only one prognostic panel was sent).  On 10/10/2013 the patient underwent bilateral breast MRI. This showed numerous enhancing masses in the left breast, the largest measuring 3.5 cm, and the aggregate measuring 12.7 cm. There were numerous enlarged left axillary lymph nodes, at least 5 of which were enlarged, both at levels 1 and 2. The largest measured 2.3 cm. The right breast was negative.  The patient's subsequent history is as detailed below.  INTERVAL HISTORY: Shelly Hall returns today for follow-up of her metastatic breast cancer accompanied by her good friend and " extra mother" Kadasia Kassing is currently living with Olegario Shearer and Vicky's daughter, who went to school with Shelly Hall].   Since her last clinic visit Shelly Hall yet another hospital admission. This time she was worked up for what seemed to be acute cholecystitis. A drain was considered, and she had a negative HIDA scan and the symptoms slowly improved on their own.  In the course of the admission however she had CT scan Of the abdomen and pelvis, as well as CT angiogram of the chest. These suggests clear evidence of disease progression particularly involving the liver. The patient is here today to discuss those findings.   REVIEW OF SYSTEMS: Shelly Hall is very tired. She still spends a lot of time in bed. Her appetite is decreased. She has nausea and vomiting. She has repeatedly loose stools, not accompanied by fever or blood. Her pain is well-controlled on her current dose of methadone with breakthrough Dilaudid. On good days she only uses the Dilaudid every 4 hours or so. A detailed review of systems today was otherwise  stable  PAST MEDICAL HISTORY: Past Medical History:  Diagnosis Date  . Anemia   . Anxiety    no meds currently  . Breast cancer (HCC) 2015   left upper outer;  had chemo  . Bronchitis 2014  .  Complication of anesthesia    makes pt. restless and unable to be still  . Depression    no meds currently  . GERD (gastroesophageal reflux disease)   . Heart murmur    never had any problems as adult  . High cholesterol   . Hypertension   . Radiation 06/23/14-08/11/14   Invasive ductal ca o left breast  . Sarcoidosis (HCC)    no problems per patient   . SVD (spontaneous vaginal delivery)    x 2  . Thyroid cancer (HCC) 2015  . Type II diabetes mellitus (HCC)     PAST SURGICAL HISTORY: Past Surgical History:  Procedure Laterality Date  . ABDOMINAL HYSTERECTOMY    . BREAST BIOPSY Left 2015 X 3   biopsy  . ENDOBRONCHIAL ULTRASOUND Bilateral 12/02/2012   Procedure: ENDOBRONCHIAL ULTRASOUND;  Surgeon: Leslye Peer, MD;  Location: [facility];  Service: Cardiopulmonary;  Laterality: Bilateral;  . LAPAROSCOPIC ASSISTED VAGINAL HYSTERECTOMY N/A 12/09/2014   Procedure: LAPAROSCOPIC ASSISTED VAGINAL HYSTERECTOMY;  Surgeon: Lavina Hamman, MD;  Location: [facility];  Service: Gynecology;  Laterality: N/A;  . LEEP    . LUNG BIOPSY  2014  . MASTECTOMY W/ SENTINEL NODE BIOPSY Left 04/23/2014   & axillary LND  . MASTECTOMY W/ SENTINEL NODE BIOPSY Left 04/23/2014   Procedure: LEFT BREAST MASTECTOMY WITH SENTINEL LYMPH NODE BIOPSY AND AXILLARY LYMPH NODE DISECTION;  Surgeon: Almond Lint, MD;  Location: [facility];  Service: General;  Laterality: Left;  . PORT-A-CATH REMOVAL Left 02/03/2015   Procedure: REMOVAL PORT-A-CATH;  Surgeon: Almond Lint, MD;  Location: [facility];  Service: General;  Laterality: Left;  . PORTACATH PLACEMENT N/A 10/22/2013   Procedure: INSERTION PORT-A-CATH;  Surgeon: Almond Lint, MD;  Location: [facility];  Service: General;  Laterality: N/A;  . SALPINGOOPHORECTOMY Bilateral 12/09/2014   Procedure: SALPINGO OOPHORECTOMY;  Surgeon: Lavina Hamman, MD;  Location: [facility];  Service: Gynecology;  Laterality: Bilateral;  . THYROID LOBECTOMY Left 04/23/2014   Procedure: LEFT  THYROID LOBECTOMY;  Surgeon: Almond Lint, MD;  Location: [facility];  Service: General;  Laterality: Left;  . TUBAL LIGATION  2001  . WISDOM TOOTH EXTRACTION      FAMILY HISTORY Family History  Problem Relation Age of Onset  . Cancer Paternal Aunt     "bone cancer"; deceased 29s  . Cancer Paternal Uncle     stomach cancer; deceased 32s  . Cancer Paternal Aunt     unk. primary; currently 60s  . Asthma Mother   . Prostate cancer Maternal Grandfather 53  The patient's parents are living, in fair mid to late 72s. The patient had one brother, no sisters. There is no history of breast or ovarian cancer in the family to her knowledge.   GYNECOLOGIC HISTORY:   (Reviewed 11/27/2013)  menarche age 43, first live birth at 19. She is GX P2. She was still having regular periods at the time of the start of her chemotherapy in May 2015.  SOCIAL HISTORY:   (Reviewed 11/27/2013) Shelly Hall works as a Arboriculturist for [employer] She is single and lives alone, although her mother is currently staying with her. Her son Shelly Hall lives in [place] and works at [employer]. Son Shelly Hall lives in  [place] where he works at a [employer]. The patient has 1 grandchild born in Sept 2015, by her son, Shelly Dame. She is a Control and instrumentation engineer.    ADVANCED DIRECTIVES: not in place; discussed again on 10/08/2015   HEALTH MAINTENANCE: (Updated 11/27/2013) Social History  Substance Use Topics  . Smoking status: Current Some Day Smoker    Packs/day: 0.25    Years: 25.00    Types: Cigarettes  . Smokeless tobacco: Never Used     Comment: 04/23/2014 "using vapor; down to 3-4 cigarettes/day now"  . Alcohol use 6.0 oz/week    4 Cans of beer, 4 Shots of liquor, 2 Standard drinks or equivalent per week     Colonoscopy: Never  PAP: Not on file  Bone density: Not on file  Lipid panel:  Not on file  Allergies  Allergen Reactions  . Nyquil Multi-Symptom [Pseudoeph-Doxylamine-Dm-Apap] Itching and Other (See  Comments)    Reaction:  Skin peeling on hands/feet     Current Outpatient Prescriptions  Medication Sig Dispense Refill  . amoxicillin-clavulanate (AUGMENTIN) 875-125 MG tablet Take 1 tablet by mouth 2 (two) times daily. 14 tablet 0  . amphetamine-dextroamphetamine (ADDERALL) 10 MG tablet Take 1 tablet (10 mg total) by mouth 2 (two) times daily with a meal. 60 tablet 0  . carvedilol (COREG) 12.5 MG tablet Take 1 tablet (12.5 mg total) by mouth 2 (two) times daily with a meal. 60 tablet 0  . ferrous sulfate 325 (65 FE) MG tablet Take 325 mg by mouth daily with breakfast.     . FLUoxetine (PROZAC) 20 MG tablet Take 20 mg by mouth daily.     Marland Kitchen gabapentin (NEURONTIN) 300 MG capsule Take 300-600 mg by mouth 3 (three) times daily. Pt takes one capsule in the morning, one in the afternoon, and two at bedtime.    Marland Kitchen HYDROmorphone (DILAUDID) 4 MG tablet Take 1 tablet (4 mg total) by mouth every 2 (two) hours as needed for severe pain. 120 tablet 0  . letrozole (FEMARA) 2.5 MG tablet Take 1 tablet (2.5 mg total) by mouth daily. 90 tablet 4  . lidocaine (LIDODERM) 5 % Place 2 patches onto the skin daily. Remove & Discard patch within 12 hours or as directed by MD 30 patch 0  . loratadine-pseudoephedrine (CLARITIN-D 24-HOUR) 10-240 MG 24 hr tablet Take 1 tablet by mouth daily as needed for allergies.     . metFORMIN (GLUCOPHAGE) 1000 MG tablet Take 1,000 mg by mouth 2 (two) times daily with a meal.     . methadone (DOLOPHINE) 5 MG tablet Take 5 tablets (25 mg total) by mouth every 8 (eight) hours. 225 tablet 0  . mometasone (NASONEX) 50 MCG/ACT nasal spray Place 1 spray into the nose 2 (two) times daily.    Marland Kitchen omeprazole (PRILOSEC) 40 MG capsule Take 1 capsule (40 mg total) by mouth daily. 90 capsule 12  . polyethylene glycol (MIRALAX / GLYCOLAX) packet Take 17 g by mouth daily.    . prochlorperazine (COMPAZINE) 10 MG tablet Take 1 tablet (10 mg total) by mouth every 6 (six) hours as needed for nausea or  vomiting. 30 tablet 0  . senna (SENOKOT) 8.6 MG TABS tablet Take 1 tablet (8.6 mg total) by mouth 2 (two) times daily. 60 each 0  . thiamine 100 MG tablet Take 1 tablet (100 mg total) by mouth daily. 30 tablet 0   No current facility-administered medications for this visit.     OBJECTIVE: Middle-aged [place] American woman Who appears  stated age  45:   02/15/16 1246  BP: 113/81  Pulse: 99  Resp: 18  Temp: 97.8 F (36.6 C)     Body mass index is 30.52 kg/m.    ECOG FS:2 - Symptomatic, <50% confined to bed   Sclerae unicteric, EOMs intact Oropharynx clear and moist No cervical or supraclavicular adenopathy Lungs no rales or rhonchi Heart regular rate and rhythm Abd soft, obese, nontender, positive bowel sounds MSK no focal spinal tenderness Neuro: nonfocal, well oriented, appropriate affect Breasts: Deferred   LAB RESULTS:   Lab Results  Component Value Date   WBC 3.0 (L) 02/15/2016   NEUTROABS 2.1 02/15/2016   HGB 8.5 (L) 02/15/2016   HCT 26.0 (L) 02/15/2016   MCV 95.2 02/15/2016   PLT 36 (L) 02/15/2016      Chemistry      Component Value Date/Time   NA 144 02/10/2016 0536   NA 137 02/02/2016 1054   K 3.6 02/10/2016 0536   K 4.5 02/02/2016 1054   CL 110 02/10/2016 0536   CO2 24 02/10/2016 0536   CO2 21 (L) 02/02/2016 1054   BUN 10 02/10/2016 0536   BUN 21.2 02/02/2016 1054   CREATININE 1.48 (H) 02/10/2016 0536   CREATININE 1.7 (H) 02/02/2016 1054      Component Value Date/Time   CALCIUM 8.8 (L) 02/10/2016 0536   CALCIUM 9.4 02/02/2016 1054   ALKPHOS 166 (H) 02/09/2016 0534   ALKPHOS 242 (H) 02/02/2016 1054   AST 85 (H) 02/09/2016 0534   AST 68 (H) 02/02/2016 1054   ALT 42 02/09/2016 0534   ALT 38 02/02/2016 1054   BILITOT 0.6 02/09/2016 0534   BILITOT <0.30 02/02/2016 1054      STUDIES: Dg Chest 2 View  Result Date: 02/06/2016 CLINICAL DATA:  Productive cough and shortness of breath. History of metastatic breast cancer. EXAM: CHEST  2 VIEW  COMPARISON:  PA and lateral chest 01/07/2016.  CT chest 01/17/2016. FINDINGS: Lung volumes are lower than on the prior chest film with associated basilar atelectasis. Peribronchial thickening is noted. No pneumothorax or pleural effusion. Heart size is normal. Surgical clips left axilla are noted. Extensive osseous sclerosis consistent with metastatic disease is seen. Remote fracture distal right clavicle is identified. IMPRESSION: Bronchitic change without focal process. Extensive osseous metastatic disease. Electronically Signed   By: Inge Rise M.D.   On: 02/06/2016 18:13   Ct Angio Chest Pe W And/or Wo Contrast  Result Date: 02/06/2016 CLINICAL DATA:  45 year old female with history of breast cancer. Rule out PE. EXAM: CT ANGIOGRAPHY CHEST CT ABDOMEN AND PELVIS WITH CONTRAST TECHNIQUE: Multidetector CT imaging of the chest was performed using the standard protocol during bolus administration of intravenous contrast. Multiplanar CT image reconstructions and MIPs were obtained to evaluate the vascular anatomy. Multidetector CT imaging of the abdomen and pelvis was performed using the standard protocol during bolus administration of intravenous contrast. CONTRAST:  100 cc Isovue 370 COMPARISON:  Chest CT dated 01/17/2016, abdominal CT dated 08/19/2015 and bone scan dated 01/10/2016 FINDINGS: CTA CHEST FINDINGS There is shallow inspiration. Minimal bibasilar dependent atelectatic changes. Diffuse airspace ground-glass density, likely related to atelectatic changes and hypovolemia. There is apparent diffuse minimal interlobular septal thickening with Kerley B-lines, likely mild pulmonary edema. Small faint nodular densities along the bronchovascular interstitium predominantly in the right upper and middle lobes noted. Although these may be inflammatory/infectious in etiology, an early pulmonary metastases or lymphangitic spread of the tumor is not entirely excluded. Clinical correlation and  follow-up  recommended. There is no pleural effusion or pneumothorax. The central airways are patent. The thoracic aorta appears unremarkable. The origins of the great vessels of the aortic arch appear patent. There is no CT evidence of pulmonary embolism. There is mild cardiomegaly. No pericardial effusion. There are bilateral hilar adenopathy. The largest lymph nodes measure up to 2.7 x 1.7 cm (previously 1.9 x 1.9 cm) in the right infrahilar region. Anterior mediastinal adenopathy in the prevascular space noted similar to prior study. Right posterior mediastinal/subcarinal adenopathy measures 2.3 x 2.3 cm. The esophagus is grossly unremarkable. Left thyroidectomy. Left axillary surgical clips noted. No axillary adenopathy identified. Left-sided mastectomy. The chest wall soft tissues are otherwise unremarkable. There is extensive osseous metastatic disease. There is nondisplaced fracture of the right posterior fourth and tenth ribs as seen on the prior CT, likely pathologic fracture. No new fracture. T1 and T8 compression fracture deformity similar to prior study. CT ABDOMEN and PELVIS FINDINGS No intra-abdominal free air or free fluid. There are extensive hepatic hypodense lesions compatible with metastatic disease. There is associated irregular appearance of the hepatic contour. There multiple small stones within the gallbladder. There is thickened appearance of the gallbladder wall. Ultrasound may provide better evaluation of the gallbladder if an acute cholecystitis is clinically suspected. The pancreas, spleen, adrenal glands, kidneys, visualized ureters, and urinary bladder appear unremarkable. Hysterectomy. There is moderate stool throughout the colon. No evidence of bowel obstruction or active inflammation. Normal appendix. There is mild aortoiliac atherosclerotic disease. The origins of the celiac axis, SMA, IMA as well as the origins of the renal arteries appear patent. The the IVC, SMV, splenic vein, and main  portal vein are patent. No portal venous gas identified. Upper abdominal adenopathy in the gastrohepatic space, and portacaval adenopathy. The abdominal wall soft tissues appear unremarkable. There is extensive osseous metastatic disease. There is pathologic compression fracture of the L5 vertebra with more than 50% loss of vertebral body height. There is retropulsed fracture fragment from the superior posterior corner of the L5 vertebral with moderate focal narrowing of the central canal. There is disc desiccation with vacuum phenomena at L4-5 and L5-S1. Overall there has been interval progression of osseous metastatic disease compared to the study dated 08/19/2015. Review of the MIP images confirms the above findings. IMPRESSION: No CT evidence of pulmonary embolism. Mild atelectatic changes with probable mild pulmonary edema. Small nodular densities predominantly in the right upper and middle lobes may be inflammatory or infectious in etiology. Metastatic disease or less likely early lymphangitic spread of tumor are not excluded. Clinical correlation and follow-up recommended. Interval progression of hilar and mediastinal adenopathy. Extensive hepatic metastatic disease. Small gallstones with diffuse gallbladder wall thickening. Ultrasound may provide better evaluation of the gallbladder if an acute cholecystitis is clinically suspected. Upper abdominal adenopathy. Extensive osseous metastatic disease with overall progression since the prior study. Multilevel compression deformities. L5 pathologic compression fracture, new from study dated 08/19/2015 with associated retropulsed fragment and focal narrowing of the central canal. Electronically Signed   By: Elgie Collard M.D.   On: 02/06/2016 22:34   Ct Angio Chest Pe W Or Wo Contrast  Result Date: 01/17/2016 CLINICAL DATA:  Acute onset of shortness of breath. Tachycardia and diaphoresis. Lethargy, with right shoulder pain. Elevated D-dimer. Initial  encounter. EXAM: CT ANGIOGRAPHY CHEST WITH CONTRAST TECHNIQUE: Multidetector CT imaging of the chest was performed using the standard protocol during bolus administration of intravenous contrast. Multiplanar CT image reconstructions and MIPs were obtained to evaluate  the vascular anatomy. CONTRAST:  100 mL of Isovue 370 IV contrast COMPARISON:  Chest radiograph performed 01/07/2016, and CT of the chest performed 08/03/2015 FINDINGS: There is no evidence of pulmonary embolus. Minimal bilateral atelectasis is noted. A 7 mm nodule at the left lung apex is somewhat more prominent than on the prior study. Metastatic disease cannot be entirely excluded. Scattered blebs are noted at the lung apices. There is no evidence of significant focal consolidation, pleural effusion or pneumothorax. Note is made of enlarged peribronchial, hilar and mediastinal nodes. The largest peribronchial node is noted extending to the right lower lobe, measuring 1.9 cm in short axis. Enlarged bilateral hilar nodes measure up to 1.2 cm in short axis. There is a 1.8 cm azygoesophageal recess node, confluent periaortic nodes measuring up to 1.8 cm in short axis, and a right paratracheal node measuring 1.2 cm. Per correlation with prior studies, this most likely reflects sarcoidosis. No pericardial effusion is identified. The great vessels are grossly unremarkable in appearance. No axillary lymphadenopathy is seen. The visualized portions of the thyroid gland are unremarkable in appearance. Postoperative change is noted at the left thyroid bed. Numerous vague hypodensities are seen within the liver, raising concern for metastatic disease, mostly new from the prior study. A small hypodense focus within the spleen is of uncertain significance. No acute osseous abnormalities are seen. Diffuse mixed sclerotic and lytic lesions are noted throughout the visualized osseous structures, compatible with metastatic disease. This appears somewhat worsened from  the prior study. Review of the MIP images confirms the above findings. IMPRESSION: 1. No evidence of pulmonary embolus. 2. Minimal bilateral atelectasis noted. 7 mm nodule at the left lung apex is somewhat more prominent than in February. Metastatic disease cannot be entirely excluded. Scattered blebs noted at the lung apices. 3. Numerous vague hypodensities within the liver, mostly new from the prior study and raising concern for new metastatic disease. Small hypodense focus within the spleen is of uncertain significance. 4. Enlarged peribronchial, hilar and mediastinal nodes are relatively stable and likely reflect sarcoidosis. 5. Diffuse metastatic disease to the bone again noted, with mixed sclerotic and lytic lesions throughout the visualized osseous structures. This appears somewhat worsened from the prior study. Electronically Signed   By: Garald Balding M.D.   On: 01/17/2016 18:48   Nm Hepatobiliary Liver Func  Result Date: 02/08/2016 CLINICAL DATA:  Right upper quadrant pain . EXAM: NUCLEAR MEDICINE HEPATOBILIARY IMAGING TECHNIQUE: Sequential images of the abdomen were obtained out to 60 minutes following intravenous administration of radiopharmaceutical. RADIOPHARMACEUTICALS:  5.1 mCi Tc-58m Choletec IV COMPARISON:  Ultrasound 02/06/2016.  CT 02/06/2016. FINDINGS: Liver visualizes normally. Gallbladder activity is visualized, consistent with patency of cystic duct. Biliary activity passes into small bowel, consistent with patent common bile duct. IMPRESSION: Normal exam.  Gallbladder visualizes normally. Electronically Signed   By: TMarcello Moores Register   On: 02/08/2016 13:24   Ct Abdomen Pelvis W Contrast  Result Date: 02/06/2016 CLINICAL DATA:  45year old female with history of breast cancer. Rule out PE. EXAM: CT ANGIOGRAPHY CHEST CT ABDOMEN AND PELVIS WITH CONTRAST TECHNIQUE: Multidetector CT imaging of the chest was performed using the standard protocol during bolus administration of intravenous  contrast. Multiplanar CT image reconstructions and MIPs were obtained to evaluate the vascular anatomy. Multidetector CT imaging of the abdomen and pelvis was performed using the standard protocol during bolus administration of intravenous contrast. CONTRAST:  100 cc Isovue 370 COMPARISON:  Chest CT dated 01/17/2016, abdominal CT dated 08/19/2015 and bone  scan dated 01/10/2016 FINDINGS: CTA CHEST FINDINGS There is shallow inspiration. Minimal bibasilar dependent atelectatic changes. Diffuse airspace ground-glass density, likely related to atelectatic changes and hypovolemia. There is apparent diffuse minimal interlobular septal thickening with Kerley B-lines, likely mild pulmonary edema. Small faint nodular densities along the bronchovascular interstitium predominantly in the right upper and middle lobes noted. Although these may be inflammatory/infectious in etiology, an early pulmonary metastases or lymphangitic spread of the tumor is not entirely excluded. Clinical correlation and follow-up recommended. There is no pleural effusion or pneumothorax. The central airways are patent. The thoracic aorta appears unremarkable. The origins of the great vessels of the aortic arch appear patent. There is no CT evidence of pulmonary embolism. There is mild cardiomegaly. No pericardial effusion. There are bilateral hilar adenopathy. The largest lymph nodes measure up to 2.7 x 1.7 cm (previously 1.9 x 1.9 cm) in the right infrahilar region. Anterior mediastinal adenopathy in the prevascular space noted similar to prior study. Right posterior mediastinal/subcarinal adenopathy measures 2.3 x 2.3 cm. The esophagus is grossly unremarkable. Left thyroidectomy. Left axillary surgical clips noted. No axillary adenopathy identified. Left-sided mastectomy. The chest wall soft tissues are otherwise unremarkable. There is extensive osseous metastatic disease. There is nondisplaced fracture of the right posterior fourth and tenth ribs as  seen on the prior CT, likely pathologic fracture. No new fracture. T1 and T8 compression fracture deformity similar to prior study. CT ABDOMEN and PELVIS FINDINGS No intra-abdominal free air or free fluid. There are extensive hepatic hypodense lesions compatible with metastatic disease. There is associated irregular appearance of the hepatic contour. There multiple small stones within the gallbladder. There is thickened appearance of the gallbladder wall. Ultrasound may provide better evaluation of the gallbladder if an acute cholecystitis is clinically suspected. The pancreas, spleen, adrenal glands, kidneys, visualized ureters, and urinary bladder appear unremarkable. Hysterectomy. There is moderate stool throughout the colon. No evidence of bowel obstruction or active inflammation. Normal appendix. There is mild aortoiliac atherosclerotic disease. The origins of the celiac axis, SMA, IMA as well as the origins of the renal arteries appear patent. The the IVC, SMV, splenic vein, and main portal vein are patent. No portal venous gas identified. Upper abdominal adenopathy in the gastrohepatic space, and portacaval adenopathy. The abdominal wall soft tissues appear unremarkable. There is extensive osseous metastatic disease. There is pathologic compression fracture of the L5 vertebra with more than 50% loss of vertebral body height. There is retropulsed fracture fragment from the superior posterior corner of the L5 vertebral with moderate focal narrowing of the central canal. There is disc desiccation with vacuum phenomena at L4-5 and L5-S1. Overall there has been interval progression of osseous metastatic disease compared to the study dated 08/19/2015. Review of the MIP images confirms the above findings. IMPRESSION: No CT evidence of pulmonary embolism. Mild atelectatic changes with probable mild pulmonary edema. Small nodular densities predominantly in the right upper and middle lobes may be inflammatory or  infectious in etiology. Metastatic disease or less likely early lymphangitic spread of tumor are not excluded. Clinical correlation and follow-up recommended. Interval progression of hilar and mediastinal adenopathy. Extensive hepatic metastatic disease. Small gallstones with diffuse gallbladder wall thickening. Ultrasound may provide better evaluation of the gallbladder if an acute cholecystitis is clinically suspected. Upper abdominal adenopathy. Extensive osseous metastatic disease with overall progression since the prior study. Multilevel compression deformities. L5 pathologic compression fracture, new from study dated 08/19/2015 with associated retropulsed fragment and focal narrowing of the central canal. Electronically Signed  By: Anner Crete M.D.   On: 02/06/2016 22:34   US Abdomen Limited Ruq  Result Date: 02/06/2016 CLINICAL DATA:  RIGHT upper quadrant pain for 3 days, history hypertension, sarcoidosis, type II diabetes mellitus, thyroid cancer, breast cancer EXAM: US ABDOMEN LIMITED - RIGHT UPPER QUADRANT COMPARISON:  CT abdomen pelvis 02/06/2016 FINDINGS: Gallbladder: Thickened gallbladder wall. Dependent shadowing calculi. Small gallbladder polyps noted. Sonographic Murphy sign present. No definite pericholecystic fluid. Common bile duct: Diameter: 6 mm diameter, upper normal Liver: Markedly heterogeneous appearance of liver parenchyma with multiple rounded areas of hypo echogenicity compatible with widespread hepatic metastases as noted on earlier CT. Hepatopetal portal venous flow. No RIGHT upper quadrant free fluid. IMPRESSION: Thickened gallbladder wall with gallstones, gallbladder polyps, and sonographic Murphy sign suspicious for acute cholecystitis. Widespread hepatic metastatic disease. Electronically Signed   By: Lavonia Dana M.D.   On: 02/06/2016 22:50    ASSESSMENT: 45 y.o. BRCA negative [place] woman with a history of sarcoidosis and breast cancer as follows:  (1)  status  post left breast overlapping sites and left axillary lymph node biopsy 10/03/2013, both positive for a clinical T2 N2, stage IIIA invasive ductal carcinoma, grade 2, estrogen and progesterone receptor positive, HER-2 negative, with an MIB-1 of 20%.  (2) Treated with neoadjuvant chemotherapy consisting of doxorubicin and cyclophosphamide in dose dense fashion x4, completed 12/11/2013, followed by paclitaxel weekly x12 (dose reduced by 20% at cycle 5 because of neuropathy concerns) completed 03/19/2014.  (3) status post left mastectomy and left axillary lymph node dissection 04/23/2014 for a pT1c pN2, stage IIIA invasive ductal carcinoma, grade 3, estrogen receptor 99% positive, progesterone receptor 4% positive, with no HER-2 amplification  (3) adjuvant radiation completed 08/11/2014 Left chest wall / 50.4 Gray @ 1.8 Gray per fraction x 28 fractions Left Supraclavicular fossa / 45 Gray _0 .8 Gray per fraction x 25 fractions Left PAB / 45 Gy at 1.8 Gray per fraction x 25 fractions Left scar / 10 Gray at [employer] per fraction x 5 fractions  (4) tamoxifen started 10/10/14, discontinued March 2017 with evidence of metastatic recurrence  (5) current smoker. Attending quitting cessation therapy with the moviation that she must quit before she has a breast reduction.   (6) genetic testing sent 10/16/2013 did not reveal any deleterious mutation in any of these genes. The genes tested were ATM, BARD1, BRCA1, BRCA2, BRIP1, CDH1, CHEK2, MRE11A, MUTYH, NBN, NF1, PALB2, PTEN, RAD50, RAD51C, RAD51D, and TP53.  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, no evidence of malignancy   (8) s/p hysterectomy and bilateral salpingo-oophorectomy 12/09/2014, with benign pathology  METASTATIC DISEASE: documented February 2017, with bone involvement, no lung or liver lesions (9) biopsy 08/13/2015 confirms metastatic breast cancer, estrogen receptor positive, progesterone receptor and HER-2  negative  (a) CA 27-29 is informative  (10) letrozole and palbociclib started 08/13/2015  (a) palbociclib dropped to 100 mg/day on 09/01/15  (b) letrozole and palbociclib discontinued September 2017 with evidence of progression  (11) palliative radiation 08/23/2015-09/06/2015: Pelvis, right shoulder and left proximal femur were all treated to 30 GY in 10 fractions at 3 Gy per fraction  (12) started zolendronate 09/01/2015  (13) started eribulin 02/28/2016   PLAN: Addalie's cancer has progressed despite her treatment with aromatase inhibitors and palbociclib. We are stopping those medications. We then discussed the situation of stage IV patients more broadly.  She understands that stage IV breast cancer is not curable with our current knowledge base. The goal of treatment is  control. The strategy of treatment is to do only the minimum necessary to control the growth of the tumor so that the patient can have as normal a life as possible. There is no survival advantage in treating aggressively if treating less aggressively results in tumor control. With this strategy stage IV breast cancer in many cases can function as a "chronic illness": something that cannot be quite gotten rid of but can be controlled for an indefinite period of time  There are always 3 questions to go over and so we reviewed those today. The first question is do we treat or not. In some cases the patient's overall situation is so discouraging that palliative/comfort care alone is appropriate. If the decision is made to treat, then the next question is whether anti-estrogens or chemotherapy is more appropriate. Once that decision is made than the third question is: Which agent or combination of agents in particular should be used?  Jenayah is very clear that she would like to be treated at this point, although I am concerned about her functional status. Since she has not responded to what I would consider fairly optimal  antiestrogen therapy I would vote on the second question to proceed to chemotherapy. More specifically I suggested eribulin. We discussed the possible toxicities, side effects and complications of this agent. She is agreeable to proceed.   She will need a port. It will be helpful to have a liver MRI and also a brain MRI as a new baseline. I am sending some of her stool for C. difficile testing. She will return to see me the first day of treatment, September 25, and we will go over her the management of supportive medications at that time. Today I did refill several of the medications that she had run out of  I did tell Shanen that if we don't get a good response from these treatments we should consider switching to come/palliative care since the chance of benefit from additional lines of treatment would be low and given her poor functional status the chance of harming her would be fairly high.         Chauncey Cruel, MD 02/15/2016 1:11 PM

## 2016-02-15 NOTE — Telephone Encounter (Signed)
Pt told Dr Shelly Hall she had brought in papers for getting help in the home, home health aid. No note found in pt chart. Asked pt to call us back with the name of the company and their phone number so we can contact them to request forms.

## 2016-02-15 NOTE — Patient Instructions (Signed)

## 2016-02-17 ENCOUNTER — Encounter (HOSPITAL_BASED_OUTPATIENT_CLINIC_OR_DEPARTMENT_OTHER): Payer: Self-pay | Admitting: *Deleted

## 2016-02-19 ENCOUNTER — Ambulatory Visit (HOSPITAL_COMMUNITY)
Admission: RE | Admit: 2016-02-19 | Discharge: 2016-02-19 | Disposition: A | Discharge status: Home / Self Care | Payer: Medicare Other | Source: Ambulatory Visit | Attending: Oncology | Admitting: Oncology

## 2016-02-19 DIAGNOSIS — C787 Secondary malignant neoplasm of liver and intrahepatic bile duct: Secondary | ICD-10-CM | POA: Diagnosis not present

## 2016-02-19 DIAGNOSIS — C7951 Secondary malignant neoplasm of bone: Secondary | ICD-10-CM

## 2016-02-19 DIAGNOSIS — J019 Acute sinusitis, unspecified: Secondary | ICD-10-CM | POA: Insufficient documentation

## 2016-02-19 DIAGNOSIS — K769 Liver disease, unspecified: Secondary | ICD-10-CM | POA: Diagnosis not present

## 2016-02-19 DIAGNOSIS — C50812 Malignant neoplasm of overlapping sites of left female breast: Secondary | ICD-10-CM

## 2016-02-19 DIAGNOSIS — C7952 Secondary malignant neoplasm of bone marrow: Secondary | ICD-10-CM | POA: Diagnosis not present

## 2016-02-19 MED ORDER — GADOBENATE DIMEGLUMINE 529 MG/ML IV SOLN
15.0000 mL | Freq: Once | INTRAVENOUS | Status: AC | PRN
  Administered 2016-02-19: 15 mL via INTRAVENOUS

## 2016-02-21 DIAGNOSIS — H43391 Other vitreous opacities, right eye: Secondary | ICD-10-CM | POA: Diagnosis not present

## 2016-02-21 DIAGNOSIS — D3132 Benign neoplasm of left choroid: Secondary | ICD-10-CM | POA: Diagnosis not present

## 2016-02-21 DIAGNOSIS — H2513 Age-related nuclear cataract, bilateral: Secondary | ICD-10-CM | POA: Diagnosis not present

## 2016-02-21 DIAGNOSIS — H1851 Endothelial corneal dystrophy: Secondary | ICD-10-CM | POA: Diagnosis not present

## 2016-02-21 DIAGNOSIS — D3131 Benign neoplasm of right choroid: Secondary | ICD-10-CM | POA: Diagnosis not present

## 2016-02-23 ENCOUNTER — Other Ambulatory Visit: Payer: Self-pay | Admitting: Oncology

## 2016-02-23 ENCOUNTER — Ambulatory Visit (HOSPITAL_BASED_OUTPATIENT_CLINIC_OR_DEPARTMENT_OTHER): Payer: Medicare Other | Admitting: Certified Registered"

## 2016-02-23 ENCOUNTER — Ambulatory Visit (HOSPITAL_COMMUNITY): Payer: Medicare Other

## 2016-02-23 ENCOUNTER — Encounter (HOSPITAL_BASED_OUTPATIENT_CLINIC_OR_DEPARTMENT_OTHER): Payer: Self-pay | Admitting: Certified Registered"

## 2016-02-23 ENCOUNTER — Other Ambulatory Visit: Payer: Self-pay | Admitting: *Deleted

## 2016-02-23 ENCOUNTER — Telehealth: Payer: Self-pay | Admitting: *Deleted

## 2016-02-23 ENCOUNTER — Ambulatory Visit (HOSPITAL_BASED_OUTPATIENT_CLINIC_OR_DEPARTMENT_OTHER)
Admission: RE | Admit: 2016-02-23 | Discharge: 2016-02-23 | Disposition: A | Discharge status: Home / Self Care | Payer: Medicare Other | Source: Ambulatory Visit | Attending: General Surgery | Admitting: General Surgery

## 2016-02-23 ENCOUNTER — Encounter (HOSPITAL_BASED_OUTPATIENT_CLINIC_OR_DEPARTMENT_OTHER)
Admission: RE | Disposition: A | Discharge status: Home / Self Care | Payer: Self-pay | Source: Ambulatory Visit | Attending: General Surgery

## 2016-02-23 DIAGNOSIS — D649 Anemia, unspecified: Secondary | ICD-10-CM | POA: Insufficient documentation

## 2016-02-23 DIAGNOSIS — C7951 Secondary malignant neoplasm of bone: Secondary | ICD-10-CM | POA: Diagnosis not present

## 2016-02-23 DIAGNOSIS — E119 Type 2 diabetes mellitus without complications: Secondary | ICD-10-CM | POA: Insufficient documentation

## 2016-02-23 DIAGNOSIS — Z79899 Other long term (current) drug therapy: Secondary | ICD-10-CM | POA: Diagnosis not present

## 2016-02-23 DIAGNOSIS — Z9012 Acquired absence of left breast and nipple: Secondary | ICD-10-CM | POA: Insufficient documentation

## 2016-02-23 DIAGNOSIS — Z7984 Long term (current) use of oral hypoglycemic drugs: Secondary | ICD-10-CM | POA: Diagnosis not present

## 2016-02-23 DIAGNOSIS — Z452 Encounter for adjustment and management of vascular access device: Secondary | ICD-10-CM | POA: Diagnosis not present

## 2016-02-23 DIAGNOSIS — C50412 Malignant neoplasm of upper-outer quadrant of left female breast: Secondary | ICD-10-CM | POA: Insufficient documentation

## 2016-02-23 DIAGNOSIS — T451X5A Adverse effect of antineoplastic and immunosuppressive drugs, initial encounter: Secondary | ICD-10-CM | POA: Diagnosis not present

## 2016-02-23 DIAGNOSIS — F419 Anxiety disorder, unspecified: Secondary | ICD-10-CM | POA: Insufficient documentation

## 2016-02-23 DIAGNOSIS — Z85850 Personal history of malignant neoplasm of thyroid: Secondary | ICD-10-CM | POA: Diagnosis not present

## 2016-02-23 DIAGNOSIS — I10 Essential (primary) hypertension: Secondary | ICD-10-CM | POA: Insufficient documentation

## 2016-02-23 DIAGNOSIS — I129 Hypertensive chronic kidney disease with stage 1 through stage 4 chronic kidney disease, or unspecified chronic kidney disease: Secondary | ICD-10-CM | POA: Diagnosis not present

## 2016-02-23 DIAGNOSIS — Z95828 Presence of other vascular implants and grafts: Secondary | ICD-10-CM

## 2016-02-23 DIAGNOSIS — K219 Gastro-esophageal reflux disease without esophagitis: Secondary | ICD-10-CM | POA: Insufficient documentation

## 2016-02-23 DIAGNOSIS — G893 Neoplasm related pain (acute) (chronic): Secondary | ICD-10-CM | POA: Insufficient documentation

## 2016-02-23 DIAGNOSIS — Z923 Personal history of irradiation: Secondary | ICD-10-CM | POA: Diagnosis not present

## 2016-02-23 DIAGNOSIS — G620 Drug-induced polyneuropathy: Secondary | ICD-10-CM | POA: Insufficient documentation

## 2016-02-23 DIAGNOSIS — C50919 Malignant neoplasm of unspecified site of unspecified female breast: Secondary | ICD-10-CM | POA: Diagnosis not present

## 2016-02-23 DIAGNOSIS — F17210 Nicotine dependence, cigarettes, uncomplicated: Secondary | ICD-10-CM | POA: Diagnosis not present

## 2016-02-23 HISTORY — PX: PORTACATH PLACEMENT: SHX2246

## 2016-02-23 LAB — GLUCOSE, CAPILLARY
GLUCOSE-CAPILLARY: 54 mg/dL — AB (ref 65–99)
Glucose-Capillary: 161 mg/dL — ABNORMAL HIGH (ref 65–99)

## 2016-02-23 SURGERY — INSERTION, TUNNELED CENTRAL VENOUS DEVICE, WITH PORT
Anesthesia: General | Site: Chest

## 2016-02-23 MED ORDER — PROMETHAZINE HCL 25 MG/ML IJ SOLN: 6.2500 mg | INTRAMUSCULAR | Status: DC | PRN

## 2016-02-23 MED ORDER — HYDROMORPHONE HCL 1 MG/ML IJ SOLN: 0.2500 mg | INTRAMUSCULAR | Status: DC | PRN

## 2016-02-23 MED ORDER — HEPARIN SOD (PORK) LOCK FLUSH 100 UNIT/ML IV SOLN
INTRAVENOUS | Status: DC | PRN
Start: 2016-02-23 — End: 2016-02-23
  Administered 2016-02-23: 500 [IU] via INTRAVENOUS

## 2016-02-23 MED ORDER — DEXTROSE 50 % IV SOLN
1.0000 | Freq: Once | INTRAVENOUS | Status: AC
  Administered 2016-02-23: 50 mL via INTRAVENOUS

## 2016-02-23 MED ORDER — LACTATED RINGERS IV SOLN
INTRAVENOUS | Status: DC | PRN
  Administered 2016-02-23: 14:00:00 via INTRAVENOUS

## 2016-02-23 MED ORDER — CEFAZOLIN SODIUM-DEXTROSE 2-4 GM/100ML-% IV SOLN
INTRAVENOUS | Status: AC
  Filled 2016-02-23: qty 100

## 2016-02-23 MED ORDER — ONDANSETRON HCL 4 MG/2ML IJ SOLN
INTRAMUSCULAR | Status: DC | PRN
  Administered 2016-02-23: 4 mg via INTRAVENOUS

## 2016-02-23 MED ORDER — CHLORHEXIDINE GLUCONATE CLOTH 2 % EX PADS: 6.0000 | MEDICATED_PAD | Freq: Once | CUTANEOUS | Status: DC

## 2016-02-23 MED ORDER — HEPARIN (PORCINE) IN NACL 2-0.9 UNIT/ML-% IJ SOLN
INTRAMUSCULAR | Status: DC | PRN
  Administered 2016-02-23: 1 via INTRAVENOUS

## 2016-02-23 MED ORDER — LIDOCAINE 2% (20 MG/ML) 5 ML SYRINGE
INTRAMUSCULAR | Status: AC
  Filled 2016-02-23: qty 5

## 2016-02-23 MED ORDER — METHADONE HCL 5 MG PO TABS: 25.0000 mg | ORAL_TABLET | Freq: Three times a day (TID) | ORAL | 0 refills | Status: DC

## 2016-02-23 MED ORDER — ONDANSETRON HCL 4 MG/2ML IJ SOLN
INTRAMUSCULAR | Status: AC
  Filled 2016-02-23: qty 2

## 2016-02-23 MED ORDER — DEXAMETHASONE SODIUM PHOSPHATE 10 MG/ML IJ SOLN
INTRAMUSCULAR | Status: AC
  Filled 2016-02-23: qty 1

## 2016-02-23 MED ORDER — MIDAZOLAM HCL 2 MG/2ML IJ SOLN
INTRAMUSCULAR | Status: AC
  Filled 2016-02-23: qty 2

## 2016-02-23 MED ORDER — GLYCOPYRROLATE 0.2 MG/ML IJ SOLN: 0.2000 mg | Freq: Once | INTRAMUSCULAR | Status: DC | PRN

## 2016-02-23 MED ORDER — FENTANYL CITRATE (PF) 100 MCG/2ML IJ SOLN
50.0000 ug | INTRAMUSCULAR | Status: DC | PRN
  Administered 2016-02-23: 100 ug via INTRAVENOUS

## 2016-02-23 MED ORDER — MIDAZOLAM HCL 2 MG/2ML IJ SOLN
1.0000 mg | INTRAMUSCULAR | Status: DC | PRN
  Administered 2016-02-23: 2 mg via INTRAVENOUS

## 2016-02-23 MED ORDER — CEFAZOLIN SODIUM-DEXTROSE 2-3 GM-% IV SOLR
INTRAVENOUS | Status: DC | PRN
  Administered 2016-02-23: 2 g via INTRAVENOUS

## 2016-02-23 MED ORDER — DEXTROSE 50 % IV SOLN
INTRAVENOUS | Status: AC
  Filled 2016-02-23: qty 50

## 2016-02-23 MED ORDER — SCOPOLAMINE 1 MG/3DAYS TD PT72: 1.0000 | MEDICATED_PATCH | Freq: Once | TRANSDERMAL | Status: DC | PRN

## 2016-02-23 MED ORDER — LIDOCAINE HCL (CARDIAC) 20 MG/ML IV SOLN
INTRAVENOUS | Status: DC | PRN
  Administered 2016-02-23: 100 mg via INTRAVENOUS

## 2016-02-23 MED ORDER — LACTATED RINGERS IV SOLN
INTRAVENOUS | Status: DC
  Administered 2016-02-23: 14:00:00 via INTRAVENOUS

## 2016-02-23 MED ORDER — PROPOFOL 10 MG/ML IV BOLUS
INTRAVENOUS | Status: DC | PRN
  Administered 2016-02-23: 160 mg via INTRAVENOUS

## 2016-02-23 MED ORDER — SUCCINYLCHOLINE CHLORIDE 20 MG/ML IJ SOLN
INTRAMUSCULAR | Status: DC | PRN
  Administered 2016-02-23: 100 mg via INTRAVENOUS

## 2016-02-23 MED ORDER — PHENYLEPHRINE HCL 10 MG/ML IJ SOLN
INTRAMUSCULAR | Status: DC | PRN
  Administered 2016-02-23 (×2): 40 ug via INTRAVENOUS

## 2016-02-23 MED ORDER — PHENYLEPHRINE 40 MCG/ML (10ML) SYRINGE FOR IV PUSH (FOR BLOOD PRESSURE SUPPORT)
PREFILLED_SYRINGE | INTRAVENOUS | Status: AC
  Filled 2016-02-23: qty 10

## 2016-02-23 MED ORDER — LIDOCAINE HCL 1 % IJ SOLN
INTRAMUSCULAR | Status: DC | PRN
  Administered 2016-02-23: 10 mL

## 2016-02-23 MED ORDER — FENTANYL CITRATE (PF) 100 MCG/2ML IJ SOLN
INTRAMUSCULAR | Status: AC
  Filled 2016-02-23: qty 2

## 2016-02-23 MED FILL — METHADONE HCL 5 MG TABLET: 5 | 19 days supply | Qty: 150 | Fill #0

## 2016-02-23 SURGICAL SUPPLY — 51 items
ADH SKN CLS APL DERMABOND .7 (GAUZE/BANDAGES/DRESSINGS) ×1
BAG DECANTER FOR FLEXI CONT (MISCELLANEOUS) ×3 IMPLANT
BLADE HEX COATED 2.75 (ELECTRODE) ×3 IMPLANT
BLADE SURG 11 STRL SS (BLADE) ×3 IMPLANT
BLADE SURG 15 STRL LF DISP TIS (BLADE) ×1 IMPLANT
BLADE SURG 15 STRL SS (BLADE) ×3
CHLORAPREP W/TINT 26ML (MISCELLANEOUS) ×3 IMPLANT
COVER BACK TABLE 60X90IN (DRAPES) ×3 IMPLANT
COVER MAYO STAND STRL (DRAPES) ×3 IMPLANT
DECANTER SPIKE VIAL GLASS SM (MISCELLANEOUS) IMPLANT
DERMABOND ADVANCED (GAUZE/BANDAGES/DRESSINGS) ×2
DERMABOND ADVANCED .7 DNX12 (GAUZE/BANDAGES/DRESSINGS) IMPLANT
DRAPE C-ARM 42X72 X-RAY (DRAPES) ×3 IMPLANT
DRAPE LAPAROTOMY TRNSV 102X78 (DRAPE) ×3 IMPLANT
DRAPE UTILITY XL STRL (DRAPES) ×3 IMPLANT
DRSG TEGADERM 4X4.75 (GAUZE/BANDAGES/DRESSINGS) IMPLANT
ELECT REM PT RETURN 9FT ADLT (ELECTROSURGICAL) ×3
ELECTRODE REM PT RTRN 9FT ADLT (ELECTROSURGICAL) ×1 IMPLANT
GAUZE SPONGE 4X4 16PLY XRAY LF (GAUZE/BANDAGES/DRESSINGS) ×2 IMPLANT
GLOVE BIO SURGEON STRL SZ 6 (GLOVE) ×3 IMPLANT
GLOVE BIO SURGEON STRL SZ7.5 (GLOVE) ×2 IMPLANT
GLOVE BIOGEL PI IND STRL 6.5 (GLOVE) ×1 IMPLANT
GLOVE BIOGEL PI IND STRL 7.0 (GLOVE) IMPLANT
GLOVE BIOGEL PI IND STRL 7.5 (GLOVE) IMPLANT
GLOVE BIOGEL PI INDICATOR 6.5 (GLOVE) ×2
GLOVE BIOGEL PI INDICATOR 7.0 (GLOVE) ×4
GLOVE BIOGEL PI INDICATOR 7.5 (GLOVE) ×2
GLOVE ECLIPSE 6.5 STRL STRAW (GLOVE) ×2 IMPLANT
GOWN STRL REUS W/ TWL LRG LVL3 (GOWN DISPOSABLE) ×1 IMPLANT
GOWN STRL REUS W/ TWL XL LVL3 (GOWN DISPOSABLE) IMPLANT
GOWN STRL REUS W/TWL 2XL LVL3 (GOWN DISPOSABLE) ×3 IMPLANT
GOWN STRL REUS W/TWL LRG LVL3 (GOWN DISPOSABLE) ×3
GOWN STRL REUS W/TWL XL LVL3 (GOWN DISPOSABLE) ×3
KIT PORT POWER 8FR ISP CVUE (Catheter) ×2 IMPLANT
LIQUID BAND (GAUZE/BANDAGES/DRESSINGS) ×1 IMPLANT
NDL HYPO 25X1 1.5 SAFETY (NEEDLE) ×1 IMPLANT
NEEDLE HYPO 25X1 1.5 SAFETY (NEEDLE) ×3 IMPLANT
PACK BASIN DAY SURGERY FS (CUSTOM PROCEDURE TRAY) ×3 IMPLANT
PENCIL BUTTON HOLSTER BLD 10FT (ELECTRODE) ×3 IMPLANT
SLEEVE SCD COMPRESS KNEE MED (MISCELLANEOUS) ×3 IMPLANT
SPONGE GAUZE 4X4 12PLY STER LF (GAUZE/BANDAGES/DRESSINGS) IMPLANT
SUT MNCRL AB 4-0 PS2 18 (SUTURE) ×3 IMPLANT
SUT PROLENE 2 0 SH DA (SUTURE) ×6 IMPLANT
SUT VIC AB 3-0 SH 27 (SUTURE) ×3
SUT VIC AB 3-0 SH 27X BRD (SUTURE) ×1 IMPLANT
SUT VICRYL 3-0 CR8 SH (SUTURE) IMPLANT
SYR 5ML LUER SLIP (SYRINGE) ×3 IMPLANT
SYR CONTROL 10ML LL (SYRINGE) ×3 IMPLANT
SYRINGE 10CC LL (SYRINGE) ×3 IMPLANT
TOWEL OR 17X24 6PK STRL BLUE (TOWEL DISPOSABLE) ×3 IMPLANT
TOWEL OR NON WOVEN STRL DISP B (DISPOSABLE) ×3 IMPLANT

## 2016-02-23 NOTE — Anesthesia Procedure Notes (Signed)
Procedure Name: Intubation Date/Time: 02/23/2016 2:07 PM Performed by: Baxter Flattery Pre-anesthesia Checklist: Patient identified, Emergency Drugs available, Suction available and Patient being monitored Patient Re-evaluated:Patient Re-evaluated prior to inductionOxygen Delivery Method: Circle system utilized Preoxygenation: Pre-oxygenation with 100% oxygen Intubation Type: IV induction Ventilation: Mask ventilation without difficulty Laryngoscope Size: Miller and 2 Grade View: Grade II Tube type: Oral Tube size: 7.0 mm Number of attempts: 1 Airway Equipment and Method: Stylet and Bite block Placement Confirmation: ETT inserted through vocal cords under direct vision,  positive ETCO2 and breath sounds checked- equal and bilateral Secured at: 22 cm Tube secured with: Tape Dental Injury: Teeth and Oropharynx as per pre-operative assessment

## 2016-02-23 NOTE — Anesthesia Preprocedure Evaluation (Addendum)
Anesthesia Evaluation  Patient identified by MRN, date of birth, ID band Patient awake    Reviewed: Allergy & Precautions, NPO status , Patient's Chart, lab work & pertinent test results, reviewed documented beta blocker date and time   History of Anesthesia Complications (+) history of anesthetic complications (restlessness)  Airway Mallampati: II  TM Distance: >3 FB Neck ROM: Full    Dental  (+) Dental Advisory Given, Teeth Intact   Pulmonary neg shortness of breath, neg sleep apnea, pneumonia, resolved, neg COPD, neg recent URI, Current Smoker,  sarcoidosis   Pulmonary exam normal breath sounds clear to auscultation       Cardiovascular hypertension, Pt. on medications and Pt. on home beta blockers (-) angina(-) Past MI, (-) Cardiac Stents and (-) CABG + Valvular Problems/Murmurs  Rhythm:Regular Rate:Normal  HLD   Neuro/Psych neg Seizures PSYCHIATRIC DISORDERS Anxiety Depression Chronic cancer pain - on methadone, hydromorphone, gabapentin, and lidocaine patch. Chemo-induced neuropathy.    GI/Hepatic GERD  Medicated,  Endo/Other  diabetes, Type 2, Oral Hypoglycemic AgentsH/o thyroid cancer in 2015   Renal/GU ARFRenal disease (earlier this month)     Musculoskeletal   Abdominal   Peds  Hematology  (+) Blood dyscrasia (leukopenia (plt 36, Hgb 8.5)), anemia ,   Anesthesia Other Findings Metastatic breast cancer s/p radiation, now progressing to chemotherapy  Recent hospital admission earlier this month for sepsis secondary to acute cholecystitis  Reproductive/Obstetrics                            Anesthesia Physical Anesthesia Plan  ASA: IV  Anesthesia Plan: General   Post-op Pain Management:    Induction: Intravenous  Airway Management Planned: Oral ETT  Additional Equipment:   Intra-op Plan:   Post-operative Plan: Extubation in OR  Informed Consent: I have reviewed the  patients History and Physical, chart, labs and discussed the procedure including the risks, benefits and alternatives for the proposed anesthesia with the patient or authorized representative who has indicated his/her understanding and acceptance.   Dental advisory given  Plan Discussed with: CRNA  Anesthesia Plan Comments: (Risks of general anesthesia discussed including, but not limited to, sore throat, hoarse voice, chipped/damaged teeth, injury to vocal cords, nausea and vomiting, allergic reactions, lung infection, heart attack, stroke, and death. All questions answered. )       Anesthesia Quick Evaluation

## 2016-02-23 NOTE — H&P (Signed)
Shelly Hall is an 45 y.o. female.   Chief Complaint: recurrent metastatic breast cancer HPI: Pt is a 45 yo F who has developed recurrent metastatic breast cancer and needs additional access for chemotherapy.  She had been getting palbociclib and aromatase inhibitors, but has had disease progression.    Past Medical History:  Diagnosis Date  . Anemia   . Anxiety    no meds currently  . Breast cancer ([place]) 2015   left upper outer;  had chemo  . Bronchitis 2014  . Complication of anesthesia    makes pt. restless and unable to be still  . Depression    no meds currently  . GERD (gastroesophageal reflux disease)   . Heart murmur    never had any problems as adult  . High cholesterol   . Hypertension   . Radiation 06/23/14-08/11/14   Invasive ductal ca o left breast  . Sarcoidosis ([place])    no problems per patient   . SVD (spontaneous vaginal delivery)    x 2  . Thyroid cancer ([place]) 2015  . Type II diabetes mellitus ([place])     Past Surgical History:  Procedure Laterality Date  . ABDOMINAL HYSTERECTOMY    . BREAST BIOPSY Left 2015 X 3   biopsy  . ENDOBRONCHIAL ULTRASOUND Bilateral 12/02/2012   Procedure: ENDOBRONCHIAL ULTRASOUND;  Surgeon: Collene Gobble, MD;  Location: [facility];  Service: Cardiopulmonary;  Laterality: Bilateral;  . LAPAROSCOPIC ASSISTED VAGINAL HYSTERECTOMY N/A 12/09/2014   Procedure: LAPAROSCOPIC ASSISTED VAGINAL HYSTERECTOMY;  Surgeon: Cheri Fowler, MD;  Location: [facility];  Service: Gynecology;  Laterality: N/A;  . LEEP    . LUNG BIOPSY  2014  . MASTECTOMY W/ SENTINEL NODE BIOPSY Left 04/23/2014   & axillary LND  . MASTECTOMY W/ SENTINEL NODE BIOPSY Left 04/23/2014   Procedure: LEFT BREAST MASTECTOMY WITH SENTINEL LYMPH NODE BIOPSY AND AXILLARY LYMPH NODE DISECTION;  Surgeon: [name], MD;  Location: [facility];  Service: General;  Laterality: Left;  . PORT-A-CATH REMOVAL Left 02/03/2015   Procedure: REMOVAL PORT-A-CATH;  Surgeon: [name], MD;   Location: [facility];  Service: General;  Laterality: Left;  . PORTACATH PLACEMENT N/A 10/22/2013   Procedure: INSERTION PORT-A-CATH;  Surgeon: [name], MD;  Location: [facility];  Service: General;  Laterality: N/A;  . SALPINGOOPHORECTOMY Bilateral 12/09/2014   Procedure: SALPINGO OOPHORECTOMY;  Surgeon: Cheri Fowler, MD;  Location: [facility];  Service: Gynecology;  Laterality: Bilateral;  . THYROID LOBECTOMY Left 04/23/2014   Procedure: LEFT THYROID LOBECTOMY;  Surgeon: [name], MD;  Location: [facility];  Service: General;  Laterality: Left;  . TUBAL LIGATION  2001  . WISDOM TOOTH EXTRACTION      Family History  Problem Relation Age of Onset  . Cancer Paternal Aunt     "bone cancer"; deceased 49s  . Cancer Paternal Uncle     stomach cancer; deceased 41s  . Cancer Paternal Aunt     unk. primary; currently 27s  . Asthma Mother   . Prostate cancer Maternal Grandfather 13   Social History:  reports that she has been smoking Cigarettes.  She has a 6.25 pack-year smoking history. She has never used smokeless tobacco. She reports that she does not drink alcohol or use drugs.  Allergies:  Allergies  Allergen Reactions  . Nyquil Multi-Symptom [Pseudoeph-Doxylamine-Dm-Apap] Itching and Other (See Comments)    Reaction:  Skin peeling on hands/feet     No prescriptions prior to admission.    No  results found for this or any previous visit (from the past 68 hour(s)). No results found.  Review of Systems  All other systems reviewed and are negative.   Height 5\' 3"  (1.6 m), weight 78 kg (172 lb), last menstrual period 12/07/2014. Physical Exam  Constitutional: She is oriented to person, place, and time. She appears well-developed and well-nourished. No distress.  HENT:  Head: Normocephalic and atraumatic.  Mouth/Throat: No oropharyngeal exudate.  Eyes: Conjunctivae are normal. Pupils are equal, round, and reactive to light. No scleral icterus.  Neck: Normal range of  motion.  Cardiovascular: Normal rate and regular rhythm.   Respiratory: Effort normal. No respiratory distress.  GI: Soft. She exhibits no distension.  Musculoskeletal: Normal range of motion.  Neurological: She is alert and oriented to person, place, and time.  Skin: Skin is warm and dry. No rash noted. She is not diaphoretic. No erythema. No pallor.  Psychiatric: She has a normal mood and affect. Her behavior is normal. Judgment and thought content normal.     Assessment/Plan Recurrent metastatic breast cancer. Plan port placement Reviewed risks and benefits.    [name], MD 02/23/2016, 9:15 AM

## 2016-02-23 NOTE — Discharge Instructions (Addendum)
[facility] °Office Phone Number [phone] ° ° POST OP INSTRUCTIONS ° °Always review your discharge instruction sheet given to you by the facility where your surgery was performed. ° °IF YOU HAVE DISABILITY OR FAMILY LEAVE FORMS, YOU MUST BRING THEM TO THE OFFICE FOR PROCESSING.  DO NOT GIVE THEM TO YOUR DOCTOR. ° °1. A prescription for pain medication may be given to you upon discharge.  Take your pain medication as prescribed, if needed.  If narcotic pain medicine is not needed, then you may take acetaminophen (Tylenol) or ibuprofen (Advil) as needed. °2. Take your usually prescribed medications unless otherwise directed °3. If you need a refill on your pain medication, please contact your pharmacy.  They will contact our office to request authorization.  Prescriptions will not be filled after 5pm or on week-ends. °4. You should eat very light the first 24 hours after surgery, such as soup, crackers, pudding, etc.  Resume your normal diet the day after surgery °5. It is common to experience some constipation if taking pain medication after surgery.  Increasing fluid intake and taking a stool softener will usually help or prevent this problem from occurring.  A mild laxative (Milk of Magnesia or Miralax) should be taken according to package directions if there are no bowel movements after 48 hours. °6. You may shower in 48 hours.  The surgical glue will flake off in 2-3 weeks.   °7. ACTIVITIES:  No strenuous activity or heavy lifting for 1 week.   °a. You may drive when you no longer are taking prescription pain medication, you can comfortably wear a seatbelt, and you can safely maneuver your car and apply brakes. °b. RETURN TO WORK:  __________n/a_______________ °You should see your doctor in the office for a follow-up appointment approximately three-four weeks after your surgery.   ° °WHEN TO CALL YOUR DOCTOR: °1. Fever over 101.0 °2. Nausea and/or vomiting. °3. Extreme swelling or  bruising. °4. Continued bleeding from incision. °5. Increased pain, redness, or drainage from the incision. ° °The clinic staff is available to answer your questions during regular business hours.  Please don’t hesitate to call and ask to speak to one of the nurses for clinical concerns.  If you have a medical emergency, go to the nearest emergency room or call 911.  A surgeon from [facility] is always on call at the hospital. ° °For further questions, please visit [email or web address]  ° ° °Post Anesthesia Home Care Instructions ° °Activity: °Get plenty of rest for the remainder of the day. A responsible adult should stay with you for 24 hours following the procedure.  °For the next 24 hours, DO NOT: °-Drive a car °-Operate machinery °-Drink alcoholic beverages °-Take any medication unless instructed by your physician °-Make any legal decisions or sign important papers. ° °Meals: °Start with liquid foods such as gelatin or soup. Progress to regular foods as tolerated. Avoid greasy, spicy, heavy foods. If nausea and/or vomiting occur, drink only clear liquids until the nausea and/or vomiting subsides. Call your physician if vomiting continues. ° °Special Instructions/Symptoms: °Your throat may feel dry or sore from the anesthesia or the breathing tube placed in your throat during surgery. If this causes discomfort, gargle with warm salt water. The discomfort should disappear within 24 hours. ° °If you had a scopolamine patch placed behind your ear for the management of post- operative nausea and/or vomiting: ° °1. The medication in the patch is effective for 72 hours, after which it should be   removed.  Wrap patch in a tissue and discard in the trash. Wash hands thoroughly with soap and water. °2. You may remove the patch earlier than 72 hours if you experience unpleasant side effects which may include dry mouth, dizziness or visual disturbances. °3. Avoid touching the patch. Wash your hands with  soap and water after contact with the patch. °  ° °

## 2016-02-23 NOTE — Telephone Encounter (Signed)
This RN spoke with to [facility] per refill today for methadone and 2 concerns.  Prescription written for 150 tabs vs usual 2 week supply of 225 - this can be corrected by sending over a new script with appropriate dispense amount.  Second concern is pt's insurance will only approve for pt to take 8 tabs a day of the 5 mg dose for a total dose of 40mg /day. Pt is currently taking 75 mg/day ( 25 mg TID ) and has been maintained at this dose for several months with good outcome.  This note will be forwarded to [employer] to obtain override for above dosage and dispense amounts requested.

## 2016-02-23 NOTE — Transfer of Care (Signed)
Immediate Anesthesia Transfer of Care Note  Patient: Shelly Hall  Procedure(s) Performed: Procedure(s): INSERTION PORT-A-CATH (N/A)  Patient Location: PACU  Anesthesia Type:General  Level of Consciousness: awake, alert , oriented and patient cooperative  Airway & Oxygen Therapy: Patient Spontanous Breathing and Patient connected to face mask oxygen  Post-op Assessment: Report given to RN and Post -op Vital signs reviewed and stable  Post vital signs: Reviewed and stable  Last Vitals:  Vitals:   02/23/16 1315  BP: 117/78  Pulse: 96  Resp: 20  Temp: 36.6 C    Last Pain:  Vitals:   02/23/16 1315  TempSrc: Oral  PainSc: 4          Complications: No apparent anesthesia complications

## 2016-02-23 NOTE — Op Note (Signed)
PREOPERATIVE DIAGNOSIS:  Recurrent metastatic breast cancer     POSTOPERATIVE DIAGNOSIS:  Same     PROCEDURE: Left subclavian port placement, Bard ClearVue  Power Port, MRI safe, 8-French.      SURGEON:  Almond Lint, MD      ANESTHESIA:  General   FINDINGS:  Good venous return, easy flush, and tip of the catheter and   SVC 19 cm.      SPECIMEN:  None.      ESTIMATED BLOOD LOSS:  Minimal.      COMPLICATIONS:  None known.      PROCEDURE:  Pt was identified in the holding area and taken to   the operating room, where patient was placed supine on the operating room   table.  General anesthesia was induced.  Patient's arms were tucked and the upper   chest and neck were prepped and draped in sterile fashion.  Time-out was   performed according to the surgical safety check list.  When all was   correct, we continued.   Local anesthetic was administered over this   area at the angle of the clavicle.  The vein was accessed easily, but originally, the wire would not pass.  The angle was altered, and the vein was again accessed with 1 stick.  The hand was retracted downward and then the wire was able to be passed.  A few beats of ectopy were seen.    Fluoroscopy was used to confirm that the wire was in the vena cava.      The patient was placed back level and the area for the pocket was anethetized   with local anesthetic.  A 3-cm transverse incision was made with a #15   blade incorporating the wire exit site with the incision.  Cautery was used to divide the subcutaneous tissues down to the   pectoralis muscle.  An [employer] retractor was used to elevate the skin   while a pocket was created on top of the pectoralis fascia.  The port   was placed into the pocket to confirm that it was of adequate size.  The   catheter was preattached to the port.  The port was then secured to the   pectoralis fascia with four 2-0 Prolene sutures.  These were clamped and   not tied down yet.    The  catheter was tunneled through to the wire exit   site.  The catheter was placed along the wire to determine what length it should be to be in the SVC.  The catheter was cut at 19 cm.  The tunneler sheath and dilator were passed over the wire and the dilator and wire were removed.  The catheter was advanced through the tunneler sheath and the tunneler sheath was pulled away.  Care was taken to keep the catheter in the tunneler sheath as this occurred. This was advanced and the tunneler sheath was removed.  There was good venous   return and easy flush of the catheter.  The Prolene sutures were tied   down to the pectoral fascia.  The skin was reapproximated using 3-0   Vicryl interrupted deep dermal sutures.    Fluoroscopy was used to re-confirm good position of the catheter.  The skin   was then closed using 4-0 Monocryl in a subcuticular fashion.  The port was flushed with concentrated heparin flush as well.  The wounds were then cleaned, dried, and dressed with Dermabond.  The patient was awakened from  anesthesia and taken to the PACU in stable condition.  Needle, sponge, and instrument counts were correct.               Shelly Klein, MD

## 2016-02-23 NOTE — Interval H&P Note (Signed)
History and Physical Interval Note:  02/23/2016 1:43 PM  Shelly Hall  has presented today for surgery, with the diagnosis of BREAST CANCER  The various methods of treatment have been discussed with the patient and family. After consideration of risks, benefits and other options for treatment, the patient has consented to  Procedure(s): INSERTION PORT-A-CATH (N/A) as a surgical intervention .  The patient's history has been reviewed, patient examined, no change in status, stable for surgery.  I have reviewed the patient's chart and labs.  Questions were answered to the patient's satisfaction.     [name]

## 2016-02-23 NOTE — Anesthesia Postprocedure Evaluation (Signed)
Anesthesia Post Note  Patient: Shelly Hall  Procedure(s) Performed: Procedure(s) (LRB): INSERTION PORT-A-CATH (N/A)  Patient location during evaluation: PACU Anesthesia Type: General Level of consciousness: awake and alert Pain management: pain level controlled Vital Signs Assessment: post-procedure vital signs reviewed and stable Respiratory status: spontaneous breathing, nonlabored ventilation and respiratory function stable Cardiovascular status: blood pressure returned to baseline and stable Postop Assessment: no signs of nausea or vomiting Anesthetic complications: no    Last Vitals:  Vitals:   02/23/16 1600 02/23/16 1610  BP: (!) 145/95   Pulse: 85 94  Resp: 17 14  Temp:      Last Pain:  Vitals:   02/23/16 1600  TempSrc:   PainSc: 0-No pain                 Nilda Simmer

## 2016-02-24 ENCOUNTER — Encounter (HOSPITAL_BASED_OUTPATIENT_CLINIC_OR_DEPARTMENT_OTHER): Payer: Self-pay | Admitting: General Surgery

## 2016-02-24 ENCOUNTER — Other Ambulatory Visit: Payer: Self-pay | Admitting: *Deleted

## 2016-02-25 ENCOUNTER — Other Ambulatory Visit: Payer: Self-pay | Admitting: *Deleted

## 2016-02-25 DIAGNOSIS — C50812 Malignant neoplasm of overlapping sites of left female breast: Secondary | ICD-10-CM

## 2016-02-27 DIAGNOSIS — C787 Secondary malignant neoplasm of liver and intrahepatic bile duct: Secondary | ICD-10-CM | POA: Insufficient documentation

## 2016-02-27 NOTE — Progress Notes (Signed)
[place]  [phone] [phone]    ID: Shelly Hall OB: [birth year]  MR#: [id number]  YPP#:[id number]  PCP: Shelly Peers, MD GYN:  Shelly Hall SU: Shelly Hall OTHER MD: Shelly Hall  CHIEF COMPLAINT:  Left Breast Cancer, estrogen receptor positive  CURRENT TREATMENT: eribulin, zolendronate  BREAST CANCER HISTORY: From the original intake note:  Shelly Hall palpated a mass in her left breast mid-April 2015 and immediately brought it to Dr. Benjie Hall' attention. She was set up for bilateral screening mammography with tomography at [facility] 09/19/2013. The possible distortion in the left breast and a nearby group of calcifications was felt to warrant further evaluation, and on 09/30/2013 she underwent unilateral left diagnostic mammography and ultrasonography. There was an irregular mass in the outer left breast associated with microcalcifications. 4 cm anterior to this there was another cluster of pleomorphic calcifications spanning 7 mm. He had more anteriorly there was an irregular mass associated with other calcifications. In total the abnormalities in the left breast spent approximately 10 cm, and is segmental distribution. On exam there was a palpable firm mass at the 2:30 o'clock position of the left breast 10 cm from the nipple. There was palpable adenopathy in the inferior left axilla. Ultrasound confirmed an irregular hypoechoic mass in the left breast measuring 4.6 cm. In addition, a 1.4 cm oval slightly irregular mass was noted at the 3:00 position and yet another mass measured 9 mm in the same area. Ultrasound of the left axilla showed a dominant 3.6 cm lymph node.  Biopsy of 2 of the left breast masses and the suspicious left breast lymph node on 10/03/2013 showed ([id number]) an invasive ductal carcinoma, grade 2, estrogen receptor 90% positive with strong staining intensity, progesterone receptor 40% positive but moderate staining  intensity, with an MIB-1 of 20% and no HER-2 amplification. (Because all 3 biopsies were morphologically identical, only one prognostic panel was sent).  On 10/10/2013 the patient underwent bilateral breast MRI. This showed numerous enhancing masses in the left breast, the largest measuring 3.5 cm, and the aggregate measuring 12.7 cm. There were numerous enlarged left axillary lymph nodes, at least 5 of which were enlarged, both at levels 1 and 2. The largest measured 2.3 cm. The right breast was negative.  The patient's subsequent history is as detailed below.  INTERVAL HISTORY: Shelly Hall returns today for follow-up of her stage IV breast cancer accompanied by her good friend and " extra mother" Shelly Hall is  living with Shelly Hall and Shelly Hall's daughter, who went to school with Shelly Hall]. Since her last visit here we obtained an MRI of the abdomen which confirms metastatic disease there. 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  Shelly Hall is scheduled to start eribulin today. This was discussed as noted below.  REVIEW OF SYSTEMS: [name] continues to be extremely fatigued. She spends more than half the day in bed. The pain is currently well-controlled. The methadone does not constipate her. She is having some pain in her right lower quadrant and some soreness in her head, but she did have an MRI of the brain September 16 which did not show any obvious CNS involvement. She uses a walker to get around. Her appetite is poor. Her sense of taste is altered. She denies significant nausea or vomiting. A detailed review of systems today was otherwise stable  PAST MEDICAL HISTORY: Past Medical History:  Diagnosis Date  .  Anemia   . Anxiety    no meds currently  . Breast cancer ([place]) 2015   left upper outer;  had chemo  . Bronchitis 2014  . Complication of anesthesia    makes pt. restless and unable to be still  .  Depression    no meds currently  . GERD (gastroesophageal reflux disease)   . Heart murmur    never had any problems as adult  . High cholesterol   . Hypertension   . Radiation 06/23/14-08/11/14   Invasive ductal ca o left breast  . Sarcoidosis ([place])    no problems per patient   . SVD (spontaneous vaginal delivery)    x 2  . Thyroid cancer ([place]) 2015  . Type II diabetes mellitus ([place])     PAST SURGICAL HISTORY: Past Surgical History:  Procedure Laterality Date  . ABDOMINAL HYSTERECTOMY    . BREAST BIOPSY Left 2015 X 3   biopsy  . ENDOBRONCHIAL ULTRASOUND Bilateral 12/02/2012   Procedure: ENDOBRONCHIAL ULTRASOUND;  Surgeon: Collene Gobble, MD;  Location: [facility];  Service: Cardiopulmonary;  Laterality: Bilateral;  . LAPAROSCOPIC ASSISTED VAGINAL HYSTERECTOMY N/A 12/09/2014   Procedure: LAPAROSCOPIC ASSISTED VAGINAL HYSTERECTOMY;  Surgeon: Cheri Fowler, MD;  Location: [facility];  Service: Gynecology;  Laterality: N/A;  . LEEP    . LUNG BIOPSY  2014  . MASTECTOMY W/ SENTINEL NODE BIOPSY Left 04/23/2014   & axillary LND  . MASTECTOMY W/ SENTINEL NODE BIOPSY Left 04/23/2014   Procedure: LEFT BREAST MASTECTOMY WITH SENTINEL LYMPH NODE BIOPSY AND AXILLARY LYMPH NODE DISECTION;  Surgeon: Shelly Klein, MD;  Location: [facility];  Service: General;  Laterality: Left;  . PORT-A-CATH REMOVAL Left 02/03/2015   Procedure: REMOVAL PORT-A-CATH;  Surgeon: Shelly Klein, MD;  Location: [facility];  Service: General;  Laterality: Left;  . PORTACATH PLACEMENT N/A 10/22/2013   Procedure: INSERTION PORT-A-CATH;  Surgeon: Shelly Klein, MD;  Location: [facility];  Service: General;  Laterality: N/A;  . PORTACATH PLACEMENT N/A 02/23/2016   Procedure: INSERTION PORT-A-CATH;  Surgeon: Shelly Klein, MD;  Location: [facility];  Service: General;  Laterality: N/A;  . SALPINGOOPHORECTOMY Bilateral 12/09/2014   Procedure: SALPINGO OOPHORECTOMY;  Surgeon: Cheri Fowler, MD;  Location: [facility];   Service: Gynecology;  Laterality: Bilateral;  . THYROID LOBECTOMY Left 04/23/2014   Procedure: LEFT THYROID LOBECTOMY;  Surgeon: Shelly Klein, MD;  Location: [facility];  Service: General;  Laterality: Left;  . TUBAL LIGATION  2001  . WISDOM TOOTH EXTRACTION      FAMILY HISTORY Family History  Problem Relation Age of Onset  . Cancer Paternal Aunt     "bone cancer"; deceased 80s  . Cancer Paternal Uncle     stomach cancer; deceased 45s  . Cancer Paternal Aunt     unk. primary; currently 23s  . Asthma Mother   . Prostate cancer Maternal Grandfather 66  The patient's parents are living, in fair mid to late 75s. The patient had one brother, no sisters. There is no history of breast or ovarian cancer in the family to her knowledge.   GYNECOLOGIC HISTORY:   (Reviewed 11/27/2013)  menarche age 48, first live birth at 27. She is GX P2. She was still having regular periods at the time of the start of her chemotherapy in May 2015.  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 She is single and lives alone, although her mother is currently staying with her. Her son  Erlene Quan Crosby-Broski lives in [place] and works at [employer]. Son [name] lives in [place] where he works at a [employer]. The patient has 1 grandchild born in Sept 2015, by her son, [name]. She is a Psychologist, forensic.    ADVANCED DIRECTIVES: Extensively discussed 02/28/2016. I recommended that in case of a terminal event Shelly Hall not have intubation or CPR. She agreed and an out of facility DO NOT RESUSCITATE order was written and given to her caregiver.   HEALTH MAINTENANCE: (Updated 11/27/2013) Social History  Substance Use Topics  . Smoking status: Current Some Day Smoker    Packs/day: 0.25    Years: 25.00    Types: Cigarettes  . Smokeless tobacco: Never Used     Comment: 04/23/2014 "using vapor; down to 3-4 cigarettes/day now"  . Alcohol use No     Colonoscopy: Never  PAP:  Not on file  Bone density: Not on file  Lipid panel:  Not on file  Allergies  Allergen Reactions  . Nyquil Multi-Symptom [Pseudoeph-Doxylamine-Dm-Apap] Itching and Other (See Comments)    Reaction:  Skin peeling on hands/feet   . Allegra-D [Fexofenadine-Pseudoephed Er] Dermatitis    Hands rash/itch/skin peeling, and feet, knees, elbows     Current Outpatient Prescriptions  Medication Sig Dispense Refill  . amphetamine-dextroamphetamine (ADDERALL) 10 MG tablet Take 1 tablet (10 mg total) by mouth 2 (two) times daily with a meal. 60 tablet 0  . carvedilol (COREG) 12.5 MG tablet Take 1 tablet (12.5 mg total) by mouth 2 (two) times daily with a meal. 60 tablet 0  . ferrous sulfate 325 (65 FE) MG tablet Take 325 mg by mouth daily with breakfast.     . FLUoxetine (PROZAC) 20 MG tablet Take 20 mg by mouth daily.     Marland Kitchen gabapentin (NEURONTIN) 300 MG capsule Take 300-600 mg by mouth 3 (three) times daily. Pt takes one capsule in the morning, one in the afternoon, and two at bedtime.    Marland Kitchen HYDROmorphone (DILAUDID) 4 MG tablet Take 1 tablet (4 mg total) by mouth every 2 (two) hours as needed for severe pain. 120 tablet 0  . lidocaine (LIDODERM) 5 % Place 2 patches onto the skin daily. Remove & Discard patch within 12 hours or as directed by MD 30 patch 0  . metFORMIN (GLUCOPHAGE) 1000 MG tablet Take 1,000 mg by mouth 2 (two) times daily with a meal.     . methadone (DOLOPHINE) 5 MG tablet Take 5 tablets (25 mg total) by mouth every 8 (eight) hours. 225 tablet 0  . omeprazole (PRILOSEC) 40 MG capsule Take 1 capsule (40 mg total) by mouth daily. 90 capsule 12  . polyethylene glycol (MIRALAX / GLYCOLAX) packet Take 17 g by mouth daily.    . prochlorperazine (COMPAZINE) 10 MG tablet Take 1 tablet (10 mg total) by mouth every 6 (six) hours as needed for nausea or vomiting. 30 tablet 0  . thiamine 100 MG tablet Take 1 tablet (100 mg total) by mouth daily. 30 tablet 0   No current facility-administered  medications for this visit.     OBJECTIVE: Middle-aged [place] American woman Who appears chronically ill Vitals:   02/28/16 1045  BP: 105/77  Pulse: (!) 106  Resp: 18  Temp: 97.7 F (36.5 C)     Body mass index is 30.17 kg/m.    ECOG FS:3 - Symptomatic, >50% confined to bed   Sclerae unicteric, pupils small, round and equal Oropharynx clear, slightly dry No cervical or supraclavicular adenopathy  Lungs no rales or rhonchi Heart regular rate and rhythm Abd soft, nontender, positive bowel sounds MSK no focal spinal tenderness Neuro: nonfocal, well oriented, appropriate affect Breasts: Deferred    LAB RESULTS:   Lab Results  Component Value Date   WBC 4.8 02/28/2016   NEUTROABS 3.4 02/28/2016   HGB 7.5 (L) 02/28/2016   HCT 23.3 (L) 02/28/2016   MCV 95.5 02/28/2016   PLT 76 (L) 02/28/2016      Chemistry      Component Value Date/Time   NA 139 02/28/2016 1025   K 4.5 02/28/2016 1025   CL 110 02/10/2016 0536   CO2 22 02/28/2016 1025   BUN 14.1 02/28/2016 1025   CREATININE 1.3 (H) 02/28/2016 1025      Component Value Date/Time   CALCIUM 9.0 02/28/2016 1025   ALKPHOS 423 (H) 02/28/2016 1025   AST 230 (HH) 02/28/2016 1025   ALT 58 (H) 02/28/2016 1025   BILITOT 1.12 02/28/2016 1025      STUDIES: Dg Chest 2 View  Result Date: 02/06/2016 CLINICAL DATA:  Productive cough and shortness of breath. History of metastatic breast cancer. EXAM: CHEST  2 VIEW COMPARISON:  PA and lateral chest 01/07/2016.  CT chest 01/17/2016. FINDINGS: Lung volumes are lower than on the prior chest film with associated basilar atelectasis. Peribronchial thickening is noted. No pneumothorax or pleural effusion. Heart size is normal. Surgical clips left axilla are noted. Extensive osseous sclerosis consistent with metastatic disease is seen. Remote fracture distal right clavicle is identified. IMPRESSION: Bronchitic change without focal process. Extensive osseous metastatic disease.  Electronically Signed   By: Inge Rise M.D.   On: 02/06/2016 18:13   Ct Angio Chest Pe W And/or Wo Contrast  Result Date: 02/06/2016 CLINICAL DATA:  45 year old female with history of breast cancer. Rule out PE. EXAM: CT ANGIOGRAPHY CHEST CT ABDOMEN AND PELVIS WITH CONTRAST TECHNIQUE: Multidetector CT imaging of the chest was performed using the standard protocol during bolus administration of intravenous contrast. Multiplanar CT image reconstructions and MIPs were obtained to evaluate the vascular anatomy. Multidetector CT imaging of the abdomen and pelvis was performed using the standard protocol during bolus administration of intravenous contrast. CONTRAST:  100 cc Isovue 370 COMPARISON:  Chest CT dated 01/17/2016, abdominal CT dated 08/19/2015 and bone scan dated 01/10/2016 FINDINGS: CTA CHEST FINDINGS There is shallow inspiration. Minimal bibasilar dependent atelectatic changes. Diffuse airspace ground-glass density, likely related to atelectatic changes and hypovolemia. There is apparent diffuse minimal interlobular septal thickening with Kerley B-lines, likely mild pulmonary edema. Small faint nodular densities along the bronchovascular interstitium predominantly in the right upper and middle lobes noted. Although these may be inflammatory/infectious in etiology, an early pulmonary metastases or lymphangitic spread of the tumor is not entirely excluded. Clinical correlation and follow-up recommended. There is no pleural effusion or pneumothorax. The central airways are patent. The thoracic aorta appears unremarkable. The origins of the great vessels of the aortic arch appear patent. There is no CT evidence of pulmonary embolism. There is mild cardiomegaly. No pericardial effusion. There are bilateral hilar adenopathy. The largest lymph nodes measure up to 2.7 x 1.7 cm (previously 1.9 x 1.9 cm) in the right infrahilar region. Anterior mediastinal adenopathy in the prevascular space noted similar to  prior study. Right posterior mediastinal/subcarinal adenopathy measures 2.3 x 2.3 cm. The esophagus is grossly unremarkable. Left thyroidectomy. Left axillary surgical clips noted. No axillary adenopathy identified. Left-sided mastectomy. The chest wall soft tissues are otherwise unremarkable. There is extensive osseous  metastatic disease. There is nondisplaced fracture of the right posterior fourth and tenth ribs as seen on the prior CT, likely pathologic fracture. No new fracture. T1 and T8 compression fracture deformity similar to prior study. CT ABDOMEN and PELVIS FINDINGS No intra-abdominal free air or free fluid. There are extensive hepatic hypodense lesions compatible with metastatic disease. There is associated irregular appearance of the hepatic contour. There multiple small stones within the gallbladder. There is thickened appearance of the gallbladder wall. Ultrasound may provide better evaluation of the gallbladder if an acute cholecystitis is clinically suspected. The pancreas, spleen, adrenal glands, kidneys, visualized ureters, and urinary bladder appear unremarkable. Hysterectomy. There is moderate stool throughout the colon. No evidence of bowel obstruction or active inflammation. Normal appendix. There is mild aortoiliac atherosclerotic disease. The origins of the celiac axis, SMA, IMA as well as the origins of the renal arteries appear patent. The the IVC, SMV, splenic vein, and main portal vein are patent. No portal venous gas identified. Upper abdominal adenopathy in the gastrohepatic space, and portacaval adenopathy. The abdominal wall soft tissues appear unremarkable. There is extensive osseous metastatic disease. There is pathologic compression fracture of the L5 vertebra with more than 50% loss of vertebral body height. There is retropulsed fracture fragment from the superior posterior corner of the L5 vertebral with moderate focal narrowing of the central canal. There is disc desiccation  with vacuum phenomena at L4-5 and L5-S1. Overall there has been interval progression of osseous metastatic disease compared to the study dated 08/19/2015. Review of the MIP images confirms the above findings. IMPRESSION: No CT evidence of pulmonary embolism. Mild atelectatic changes with probable mild pulmonary edema. Small nodular densities predominantly in the right upper and middle lobes may be inflammatory or infectious in etiology. Metastatic disease or less likely early lymphangitic spread of tumor are not excluded. Clinical correlation and follow-up recommended. Interval progression of hilar and mediastinal adenopathy. Extensive hepatic metastatic disease. Small gallstones with diffuse gallbladder wall thickening. Ultrasound may provide better evaluation of the gallbladder if an acute cholecystitis is clinically suspected. Upper abdominal adenopathy. Extensive osseous metastatic disease with overall progression since the prior study. Multilevel compression deformities. L5 pathologic compression fracture, new from study dated 08/19/2015 with associated retropulsed fragment and focal narrowing of the central canal. Electronically Signed   By: Anner Crete M.D.   On: 02/06/2016 22:34   Mr Jeri Cos ZM Contrast  Result Date: 02/19/2016 CLINICAL DATA:  Breast cancer, bone metastases, pain, confusion, and difficulty walking. Weakness. EXAM: MRI HEAD WITHOUT AND WITH CONTRAST TECHNIQUE: Multiplanar, multiecho pulse sequences of the brain and surrounding structures were obtained without and with intravenous contrast. CONTRAST:  60m MULTIHANCE GADOBENATE DIMEGLUMINE 529 MG/ML IV SOLN COMPARISON:  No prior brain imaging is available. FINDINGS: Brain: No evidence for acute infarction, hemorrhage, mass lesion, hydrocephalus, or extra-axial fluid. Normal cerebral volume. No significant white matter disease. Post infusion, No intra-axial or leptomeningeal enhancement. Diffuse pachymeningeal enhancement is greatest  over the LEFT convexity, associated with asymmetric osseous disease in this region. Vascular: Flow voids are maintained throughout the carotid, basilar, and vertebral arteries. There are no areas of chronic hemorrhage. Skull and upper cervical spine: Extensive osseous metastatic disease throughout the calvarium, skullbase, and upper cervical region. This is greatest in the LEFT frontal bone. A discrete epidural mass is not clearly present, but disruption of the inner table of the skull is noted focally as seen on image 16 series 7 and image 41 series 5. Sinuses/Orbits: Layering fluid in the  RIGHT maxillary and both divisions of the sphenoid sinuses. RIGHT frontal sinus fluid is also evident. No orbital findings. Other: No nasopharyngeal pathology or mastoid fluid. Scalp and other visualized extracranial soft tissues grossly unremarkable. IMPRESSION: Extensive osseous metastatic disease to calvarium, skullbase, and upper cervical region. Pachymeningeal enhancement is greatest in the LEFT frontal bone, reflecting the adjacent osseous disease. No epidural tumor in the upper spine. No intra-axial metastatic deposits or leptomeningeal spread of tumor. Acute sinusitis. Electronically Signed   By: Staci Righter M.D.   On: 02/19/2016 14:25   Nm Hepatobiliary Liver Func  Result Date: 02/08/2016 CLINICAL DATA:  Right upper quadrant pain . EXAM: NUCLEAR MEDICINE HEPATOBILIARY IMAGING TECHNIQUE: Sequential images of the abdomen were obtained out to 60 minutes following intravenous administration of radiopharmaceutical. RADIOPHARMACEUTICALS:  5.1 mCi Tc-28m Choletec IV COMPARISON:  Ultrasound 02/06/2016.  CT 02/06/2016. FINDINGS: Liver visualizes normally. Gallbladder activity is visualized, consistent with patency of cystic duct. Biliary activity passes into small bowel, consistent with patent common bile duct. IMPRESSION: Normal exam.  Gallbladder visualizes normally. Electronically Signed   By: TMarcello Moores Register   On:  02/08/2016 13:24   Ct Abdomen Pelvis W Contrast  Result Date: 02/06/2016 CLINICAL DATA:  45year old female with history of breast cancer. Rule out PE. EXAM: CT ANGIOGRAPHY CHEST CT ABDOMEN AND PELVIS WITH CONTRAST TECHNIQUE: Multidetector CT imaging of the chest was performed using the standard protocol during bolus administration of intravenous contrast. Multiplanar CT image reconstructions and MIPs were obtained to evaluate the vascular anatomy. Multidetector CT imaging of the abdomen and pelvis was performed using the standard protocol during bolus administration of intravenous contrast. CONTRAST:  100 cc Isovue 370 COMPARISON:  Chest CT dated 01/17/2016, abdominal CT dated 08/19/2015 and bone scan dated 01/10/2016 FINDINGS: CTA CHEST FINDINGS There is shallow inspiration. Minimal bibasilar dependent atelectatic changes. Diffuse airspace ground-glass density, likely related to atelectatic changes and hypovolemia. There is apparent diffuse minimal interlobular septal thickening with Kerley B-lines, likely mild pulmonary edema. Small faint nodular densities along the bronchovascular interstitium predominantly in the right upper and middle lobes noted. Although these may be inflammatory/infectious in etiology, an early pulmonary metastases or lymphangitic spread of the tumor is not entirely excluded. Clinical correlation and follow-up recommended. There is no pleural effusion or pneumothorax. The central airways are patent. The thoracic aorta appears unremarkable. The origins of the great vessels of the aortic arch appear patent. There is no CT evidence of pulmonary embolism. There is mild cardiomegaly. No pericardial effusion. There are bilateral hilar adenopathy. The largest lymph nodes measure up to 2.7 x 1.7 cm (previously 1.9 x 1.9 cm) in the right infrahilar region. Anterior mediastinal adenopathy in the prevascular space noted similar to prior study. Right posterior mediastinal/subcarinal adenopathy  measures 2.3 x 2.3 cm. The esophagus is grossly unremarkable. Left thyroidectomy. Left axillary surgical clips noted. No axillary adenopathy identified. Left-sided mastectomy. The chest wall soft tissues are otherwise unremarkable. There is extensive osseous metastatic disease. There is nondisplaced fracture of the right posterior fourth and tenth ribs as seen on the prior CT, likely pathologic fracture. No new fracture. T1 and T8 compression fracture deformity similar to prior study. CT ABDOMEN and PELVIS FINDINGS No intra-abdominal free air or free fluid. There are extensive hepatic hypodense lesions compatible with metastatic disease. There is associated irregular appearance of the hepatic contour. There multiple small stones within the gallbladder. There is thickened appearance of the gallbladder wall. Ultrasound may provide better evaluation of the gallbladder if an acute cholecystitis  is clinically suspected. The pancreas, spleen, adrenal glands, kidneys, visualized ureters, and urinary bladder appear unremarkable. Hysterectomy. There is moderate stool throughout the colon. No evidence of bowel obstruction or active inflammation. Normal appendix. There is mild aortoiliac atherosclerotic disease. The origins of the celiac axis, SMA, IMA as well as the origins of the renal arteries appear patent. The the IVC, SMV, splenic vein, and main portal vein are patent. No portal venous gas identified. Upper abdominal adenopathy in the gastrohepatic space, and portacaval adenopathy. The abdominal wall soft tissues appear unremarkable. There is extensive osseous metastatic disease. There is pathologic compression fracture of the L5 vertebra with more than 50% loss of vertebral body height. There is retropulsed fracture fragment from the superior posterior corner of the L5 vertebral with moderate focal narrowing of the central canal. There is disc desiccation with vacuum phenomena at L4-5 and L5-S1. Overall there has been  interval progression of osseous metastatic disease compared to the study dated 08/19/2015. Review of the MIP images confirms the above findings. IMPRESSION: No CT evidence of pulmonary embolism. Mild atelectatic changes with probable mild pulmonary edema. Small nodular densities predominantly in the right upper and middle lobes may be inflammatory or infectious in etiology. Metastatic disease or less likely early lymphangitic spread of tumor are not excluded. Clinical correlation and follow-up recommended. Interval progression of hilar and mediastinal adenopathy. Extensive hepatic metastatic disease. Small gallstones with diffuse gallbladder wall thickening. Ultrasound may provide better evaluation of the gallbladder if an acute cholecystitis is clinically suspected. Upper abdominal adenopathy. Extensive osseous metastatic disease with overall progression since the prior study. Multilevel compression deformities. L5 pathologic compression fracture, new from study dated 08/19/2015 with associated retropulsed fragment and focal narrowing of the central canal. Electronically Signed   By: Anner Crete M.D.   On: 02/06/2016 22:34   Mr Liver W Wo Contrast  Result Date: 02/19/2016 CLINICAL DATA:  Breast cancer with the liver metastasis. EXAM: MRI ABDOMEN WITHOUT AND WITH CONTRAST TECHNIQUE: Multiplanar multisequence MR imaging of the abdomen was performed both before and after the administration of intravenous contrast. CONTRAST:  15 mL MultiHance COMPARISON:  CT 02/06/2016 FINDINGS: Lower chest:  Lung bases are clear. Hepatobiliary: Multiple round lesions within LEFT and RIGHT hepatic lobe which hyperintense on T2 weighted imaging consistent with liver metastasis. Lesions demonstrate heterogeneous enhancement. Example lesion in the central LEFT hepatic lobe measures 3.4 by 3.3 cm (image 16, series 3). Lesion in the inferior RIGHT hepatic lobe measures 3.9 by 2.6 cm (image 26, series 3). There are approximately 30  lesions within each hepatic lobe. No biliary duct dilatation. Gallbladder is collapsed around sludge or gallstones. Common bile duct normal caliber. Pancreas: Normal pancreatic parenchymal intensity. No ductal dilatation or inflammation. Spleen: Normal spleen. Adrenals/urinary tract: Adrenal glands and kidneys are normal. Stomach/Bowel: Stomach and limited of the small bowel is unremarkable Vascular/Lymphatic: Heterogeneous marrow enhancement and signal abnormality consistent with skeletal metastasis. Musculoskeletal: No aggressive osseous lesion IMPRESSION: 1. Widespread hepatic metastasis with lesions occupying the majority of the liver parenchyma. 2. Ill-defined but diffuse skeletal marrow metastasis. Electronically Signed   By: Suzy Bouchard M.D.   On: 02/19/2016 14:20   Dg Chest Port 1 View  Result Date: 02/23/2016 CLINICAL DATA:  Port-A-Cath insertion history of breast carcinoma and sarcoidosis EXAM: PORTABLE CHEST 1 VIEW COMPARISON:  Chest radiograph and chest CT February 06, 2016 FINDINGS: Port-A-Cath tip is in the superior vena cava. No pneumothorax. There is atelectatic change in the bases. Lungs elsewhere clear. Heart is upper  normal in size with pulmonary vascularity within normal limits. Soft tissue prominence is noted in the right peritracheal and right hilar regions, most likely due to adenopathy. There are surgical clips in the left axilla. There is evidence of prior fracture of the lateral right clavicle there are multiple foci of bony metastatic disease, stable. There are surgical clips in the left cervical-thoracic junction region. There is calcification in the left carotid artery. IMPRESSION: Port-A-Cath tip in superior vena cava. No pneumothorax. Bibasilar atelectasis. Right hilar and paratracheal adenopathy. Question adenopathy due to sarcoidosis versus neoplasm. There are bony metastases. Surgical clips noted in left cervical-thoracic region and left axilla. There is calcification in  the left carotid artery. Electronically Signed   By: Lowella Grip III M.D.   On: 02/23/2016 15:29   Dg Fluoro Guide Cv Line-no Report  Result Date: 02/23/2016 CLINICAL DATA:  FLOURO GUIDE CV LINE Fluoroscopy was utilized by the requesting physician.  No radiographic interpretation.   US Abdomen Limited Ruq  Result Date: 02/06/2016 CLINICAL DATA:  RIGHT upper quadrant pain for 3 days, history hypertension, sarcoidosis, type II diabetes mellitus, thyroid cancer, breast cancer EXAM: US ABDOMEN LIMITED - RIGHT UPPER QUADRANT COMPARISON:  CT abdomen pelvis 02/06/2016 FINDINGS: Gallbladder: Thickened gallbladder wall. Dependent shadowing calculi. Small gallbladder polyps noted. Sonographic Murphy sign present. No definite pericholecystic fluid. Common bile duct: Diameter: 6 mm diameter, upper normal Liver: Markedly heterogeneous appearance of liver parenchyma with multiple rounded areas of hypo echogenicity compatible with widespread hepatic metastases as noted on earlier CT. Hepatopetal portal venous flow. No RIGHT upper quadrant free fluid. IMPRESSION: Thickened gallbladder wall with gallstones, gallbladder polyps, and sonographic Murphy sign suspicious for acute cholecystitis. Widespread hepatic metastatic disease. Electronically Signed   By: Lavonia Dana M.D.   On: 02/06/2016 22:50    ASSESSMENT: 45 y.o. BRCA negative [place] woman with a history of sarcoidosis and breast cancer as follows:  (1)  status post left breast overlapping sites and left axillary lymph node biopsy 10/03/2013, both positive for a clinical T2 N2, stage IIIA invasive ductal carcinoma, grade 2, estrogen and progesterone receptor positive, HER-2 negative, with an MIB-1 of 20%.  (2) Treated with neoadjuvant chemotherapy consisting of doxorubicin and cyclophosphamide in dose dense fashion x4, completed 12/11/2013, followed by paclitaxel weekly x12 (dose reduced by 20% at cycle 5 because of neuropathy concerns) completed  03/19/2014.  (3) status post left mastectomy and left axillary lymph node dissection 04/23/2014 for a pT1c pN2, stage IIIA invasive ductal carcinoma, grade 3, estrogen receptor 99% positive, progesterone receptor 4% positive, with no HER-2 amplification  (3) adjuvant radiation completed 08/11/2014 Left chest wall / 50.4 Gray @ 1.8 Gray per fraction x 28 fractions Left Supraclavicular fossa / 45 Gray '@1'$ .8 Gray per fraction x 25 fractions Left PAB / 45 Gy at 1.8 Gray per fraction x 25 fractions Left scar / 10 Gray at [employer] per fraction x 5 fractions  (4) tamoxifen started 10/10/14, discontinued March 2017 with evidence of metastatic recurrence  (5) current smoker. Attending quitting cessation therapy with the moviation that she must quit before she has a breast reduction.   (6) genetic testing sent 10/16/2013 did not reveal any deleterious mutation in any of these genes. The genes tested were ATM, BARD1, BRCA1, BRCA2, BRIP1, CDH1, CHEK2, MRE11A, MUTYH, NBN, NF1, PALB2, PTEN, RAD50, RAD51C, RAD51D, and TP53.  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, no evidence of malignancy   (8) s/p hysterectomy and bilateral salpingo-oophorectomy 12/09/2014, with  benign pathology  METASTATIC DISEASE: documented February 2017, with bone involvement, no lung or liver lesions (9) biopsy 08/13/2015 confirms metastatic breast cancer, estrogen receptor positive, progesterone receptor and HER-2 negative  (a) CA 27-29 is informative  (10) letrozole and palbociclib started 08/13/2015  (a) palbociclib dropped to 100 mg/day on 09/01/15  (b) letrozole and palbociclib discontinued September 2017 with evidence of progression  (11) palliative radiation 08/23/2015-09/06/2015: Pelvis, right shoulder and left proximal femur were all treated to 30 GY in 10 fractions at 3 Gy per fraction  (12) started zolendronate 09/01/2015  (13) started eribulin 02/28/2016 at reduced doses because of  elevated liver function tests   PLAN: Jamyia I believe is entering a terminal phase of her disease and a told her that in my opinion the chemotherapy was as likely to hurt her as to prolong her life. In fact it could well shorten her life. It certainly would worsen some of the symptoms she has now including fatigue, low counts, nausea, and possibly neuropathy.  I told Gurtha that if she were a family member of mind I would strongly recommend no further life prolonging treatments, but concentrating on making her feel as good as possible and keeping her as fit as possible as long as possible, with the help of hospice. I let her know that I think she only has a few weeks to live.  She tells me that she wants to "fight". After much discussion we decided we would do today's eribulin treatment but I am dropping the dose given her elevated liver function tests. She will see me again next week and we will decide on the day 8 treatment at that time.  In the meantime she was agreeable to a DO NOT RESUSCITATE order and I wrote out facility DO NOT RESUSCITATE order which I gave him to keep at home for use as appropriate.  She is interested in referral to radiation oncology for consideration of a brief treatments to her eyes to prevent possible loss of vision later on. I will be glad to place that for her.  Otherwise she knows to call for any problems that may develop before her next visit here.       Chauncey Cruel, MD 02/28/2016 7:52 PM

## 2016-02-28 ENCOUNTER — Telehealth: Payer: Self-pay | Admitting: *Deleted

## 2016-02-28 ENCOUNTER — Other Ambulatory Visit (HOSPITAL_BASED_OUTPATIENT_CLINIC_OR_DEPARTMENT_OTHER): Payer: Medicare Other

## 2016-02-28 ENCOUNTER — Ambulatory Visit: Payer: Medicare Other

## 2016-02-28 ENCOUNTER — Ambulatory Visit (HOSPITAL_BASED_OUTPATIENT_CLINIC_OR_DEPARTMENT_OTHER): Payer: Medicare Other

## 2016-02-28 ENCOUNTER — Ambulatory Visit (HOSPITAL_BASED_OUTPATIENT_CLINIC_OR_DEPARTMENT_OTHER): Payer: Medicare Other | Admitting: Oncology

## 2016-02-28 VITALS — BP 105/77 | HR 106 | Temp 97.7°F | Resp 18 | Ht 63.0 in | Wt 170.3 lb

## 2016-02-28 DIAGNOSIS — C7951 Secondary malignant neoplasm of bone: Secondary | ICD-10-CM

## 2016-02-28 DIAGNOSIS — C50912 Malignant neoplasm of unspecified site of left female breast: Secondary | ICD-10-CM | POA: Diagnosis not present

## 2016-02-28 DIAGNOSIS — G893 Neoplasm related pain (acute) (chronic): Secondary | ICD-10-CM

## 2016-02-28 DIAGNOSIS — C50412 Malignant neoplasm of upper-outer quadrant of left female breast: Secondary | ICD-10-CM | POA: Diagnosis not present

## 2016-02-28 DIAGNOSIS — Z170 Estrogen receptor positive status [ER+]: Secondary | ICD-10-CM

## 2016-02-28 DIAGNOSIS — C773 Secondary and unspecified malignant neoplasm of axilla and upper limb lymph nodes: Secondary | ICD-10-CM

## 2016-02-28 DIAGNOSIS — E119 Type 2 diabetes mellitus without complications: Secondary | ICD-10-CM

## 2016-02-28 DIAGNOSIS — C50812 Malignant neoplasm of overlapping sites of left female breast: Secondary | ICD-10-CM

## 2016-02-28 DIAGNOSIS — D869 Sarcoidosis, unspecified: Secondary | ICD-10-CM

## 2016-02-28 DIAGNOSIS — C787 Secondary malignant neoplasm of liver and intrahepatic bile duct: Secondary | ICD-10-CM

## 2016-02-28 DIAGNOSIS — Z5111 Encounter for antineoplastic chemotherapy: Secondary | ICD-10-CM | POA: Diagnosis not present

## 2016-02-28 DIAGNOSIS — R530 Neoplastic (malignant) related fatigue: Secondary | ICD-10-CM

## 2016-02-28 LAB — CBC WITH DIFFERENTIAL/PLATELET
BASO%: 0.2 % (ref 0.0–2.0)
BASOS ABS: 0 10*3/uL (ref 0.0–0.1)
EOS%: 0.8 % (ref 0.0–7.0)
Eosinophils Absolute: 0 10*3/uL (ref 0.0–0.5)
HCT: 23.3 % — ABNORMAL LOW (ref 34.8–46.6)
HEMOGLOBIN: 7.5 g/dL — AB (ref 11.6–15.9)
LYMPH#: 0.6 10*3/uL — AB (ref 0.9–3.3)
LYMPH%: 12.3 % — AB (ref 14.0–49.7)
MCH: 30.7 pg (ref 25.1–34.0)
MCHC: 32.2 g/dL (ref 31.5–36.0)
MCV: 95.5 fL (ref 79.5–101.0)
MONO#: 0.8 10*3/uL (ref 0.1–0.9)
MONO%: 15.6 % — AB (ref 0.0–14.0)
NEUT#: 3.4 10*3/uL (ref 1.5–6.5)
NEUT%: 71.1 % (ref 38.4–76.8)
Platelets: 76 10*3/uL — ABNORMAL LOW (ref 145–400)
RBC: 2.44 10*6/uL — ABNORMAL LOW (ref 3.70–5.45)
RDW: 19.8 % — ABNORMAL HIGH (ref 11.2–14.5)
WBC: 4.8 10*3/uL (ref 3.9–10.3)
nRBC: 2 % — ABNORMAL HIGH (ref 0–0)

## 2016-02-28 LAB — COMPREHENSIVE METABOLIC PANEL
ALBUMIN: 2.3 g/dL — AB (ref 3.5–5.0)
ALK PHOS: 423 U/L — AB (ref 40–150)
ALT: 58 U/L — ABNORMAL HIGH (ref 0–55)
AST: 230 U/L (ref 5–34)
Anion Gap: 12 mEq/L — ABNORMAL HIGH (ref 3–11)
BUN: 14.1 mg/dL (ref 7.0–26.0)
CHLORIDE: 105 meq/L (ref 98–109)
CO2: 22 mEq/L (ref 22–29)
Calcium: 9 mg/dL (ref 8.4–10.4)
Creatinine: 1.3 mg/dL — ABNORMAL HIGH (ref 0.6–1.1)
EGFR: 56 mL/min/{1.73_m2} — AB (ref >90)
GLUCOSE: 67 mg/dL — AB (ref 70–140)
POTASSIUM: 4.5 meq/L (ref 3.5–5.1)
SODIUM: 139 meq/L (ref 136–145)
Total Bilirubin: 1.12 mg/dL (ref 0.20–1.20)
Total Protein: 6.9 g/dL (ref 6.4–8.3)

## 2016-02-28 MED ORDER — PROCHLORPERAZINE MALEATE 10 MG PO TABS
10.0000 mg | ORAL_TABLET | Freq: Once | ORAL | Status: AC
Start: 2016-02-28 — End: 2016-02-28
  Administered 2016-02-28: 10 mg via ORAL

## 2016-02-28 MED ORDER — LIDOCAINE 5 % EX PTCH: 2.0000 | MEDICATED_PATCH | CUTANEOUS | 0 refills | Status: AC

## 2016-02-28 MED ORDER — SODIUM CHLORIDE 0.9 % IV SOLN
0.7000 mg/m2 | Freq: Once | INTRAVENOUS | Status: AC
  Administered 2016-02-28: 1.3 mg via INTRAVENOUS
  Filled 2016-02-28: qty 2.6

## 2016-02-28 MED ORDER — PROCHLORPERAZINE MALEATE 10 MG PO TABS
ORAL_TABLET | ORAL | Status: AC
  Filled 2016-02-28: qty 1

## 2016-02-28 MED ORDER — HEPARIN SOD (PORK) LOCK FLUSH 100 UNIT/ML IV SOLN
500.0000 [IU] | Freq: Once | INTRAVENOUS | Status: AC | PRN
  Administered 2016-02-28: 500 [IU]
  Filled 2016-02-28: qty 5

## 2016-02-28 MED ORDER — SODIUM CHLORIDE 0.9 % IV SOLN
Freq: Once | INTRAVENOUS | Status: AC
  Administered 2016-02-28: 12:00:00 via INTRAVENOUS

## 2016-02-28 MED ORDER — SODIUM CHLORIDE 0.9% FLUSH
10.0000 mL | INTRAVENOUS | Status: DC | PRN
  Administered 2016-02-28: 10 mL
  Filled 2016-02-28: qty 10

## 2016-02-28 MED FILL — LIDOCAINE 5% PATCH: 5 | 15 days supply | Qty: 30 | Fill #0

## 2016-02-28 MED FILL — LIDOCAINE-PRILOCAINE CREAM: 2.5-2.5 | 30 days supply | Qty: 30 | Fill #0

## 2016-02-28 NOTE — Patient Instructions (Addendum)
[facility] Discharge Instructions for Patients Receiving Chemotherapy  Today you received the following chemotherapy agents: Haloven. To help prevent nausea and vomiting after your treatment, we encourage you to take your nausea medication as prescribed.  If you develop nausea and vomiting that is not controlled by your nausea medication, call the clinic.   BELOW ARE SYMPTOMS THAT SHOULD BE REPORTED IMMEDIATELY:  *FEVER GREATER THAN 100.5 F  *CHILLS WITH OR WITHOUT FEVER  NAUSEA AND VOMITING THAT IS NOT CONTROLLED WITH YOUR NAUSEA MEDICATION  *UNUSUAL SHORTNESS OF BREATH  *UNUSUAL BRUISING OR BLEEDING  TENDERNESS IN MOUTH AND THROAT WITH OR WITHOUT PRESENCE OF ULCERS  *URINARY PROBLEMS  *BOWEL PROBLEMS  UNUSUAL RASH Items with * indicate a potential emergency and should be followed up as soon as possible.  Feel free to call the clinic you have any questions or concerns. The clinic phone number is [phone].   Eribulin solution for injection What is this medicine? ERIBULIN (er e bu lin) is a chemotherapy drug. It is used to treat breast cancer and liposarcoma. This medicine may be used for other purposes; ask your health care provider or pharmacist if you have questions. What should I tell my health care provider before I take this medicine? They need to know if you have any of these conditions: -heart disease -history of irregular heartbeat -kidney disease -liver disease -low blood counts, like low white cell, platelet, or red cell counts -low levels of potassium or magnesium in the blood -an unusual or allergic reaction to eribulin, other medicines, foods, dyes, or preservatives -pregnant or trying to get pregnant -breast-feeding How should I use this medicine? This medicine is for infusion into a vein. It is given by a health care professional in a hospital or clinic setting. Talk to your pediatrician regarding the use of this medicine in  children. Special care may be needed. Overdosage: If you think you have taken too much of this medicine contact a poison control center or emergency room at once. NOTE: This medicine is only for you. Do not share this medicine with others. What if I miss a dose? It is important not to miss your dose. Call your doctor or health care professional if you are unable to keep an appointment. What may interact with this medicine? Do not take this medicine with any of the following medications: -amiodarone -astemizole -arsenic trioxide -bepridil -bretylium -chloroquine -chlorpromazine -cisapride -clarithromycin -dextromethorphan, quinidine -disopyramide -dofetilide -droperidol -dronedarone -erythromycin -grepafloxacin -halofantrine -haloperidol -ibutilide -levomethadyl -mesoridazine -methadone -pentamidine -procainamide -quinidine -pimozide -posaconazole -probucol -propafenone -saquinavir -sotalol -sparfloxacin -terfenadine -thioridazine -troleandomycin -ziprasidone This list may not describe all possible interactions. Give your health care provider a list of all the medicines, herbs, non-prescription drugs, or dietary supplements you use. Also tell them if you smoke, drink alcohol, or use illegal drugs. Some items may interact with your medicine. What should I watch for while using this medicine? This drug may make you feel generally unwell. This is not uncommon, as chemotherapy can affect healthy cells as well as cancer cells. Report any side effects. Continue your course of treatment even though you feel ill unless your doctor tells you to stop. Call your doctor or health care professional for advice if you get a fever, chills or sore throat, or other symptoms of a cold or flu. Do not treat yourself. This drug decreases your body's ability to fight infections. Try to avoid being around people who are sick. This medicine may increase your risk to bruise  or bleed. Call your  doctor or health care professional if you notice any unusual bleeding. You may need blood work done while you are taking this medicine. Do not become pregnant while taking this medicine or for 2 weeks after stopping it. Women should inform their doctor if they wish to become pregnant or think they might be pregnant. Men should not father a child while taking this medicine and for 3.5 months after stopping it. There is a potential for serious side effects to an unborn child. Talk to your health care professional or pharmacist for more information. Do not breast-feed an infant while taking this medicine or for 2 weeks after stopping it. What side effects may I notice from receiving this medicine? Side effects that you should report to your doctor or health care professional as soon as possible: -allergic reactions like skin rash, itching or hives, swelling of the face, lips, or tongue -low blood counts - this medicine may decrease the number of white blood cells, red blood cells and platelets. You may be at increased risk for infections and bleeding. -signs of infection - fever or chills, cough, sore throat, pain or difficulty passing urine -signs of decreased platelets or bleeding - bruising, pinpoint red spots on the skin, black, tarry stools, blood in the urine -signs of decreased red blood cells - unusually weak or tired, fainting spells, lightheadedness -pain, tingling, numbness in the hands or feet Side effects that usually do not require medical attention (Report these to your doctor or health care professional if they continue or are bothersome.): -constipation -hair loss -headache -loss of appetite -muscle or joint pain -nausea, vomiting -stomach pain This list may not describe all possible side effects. Call your doctor for medical advice about side effects. You may report side effects to FDA at 1-800-FDA-1088. Where should I keep my medicine? This drug is given in a hospital or clinic  and will not be stored at home. NOTE: This sheet is a summary. It may not cover all possible information. If you have questions about this medicine, talk to your doctor, pharmacist, or health care provider.    2016, [employer]. (2014-07-08 17:51:40)

## 2016-02-28 NOTE — Telephone Encounter (Signed)
Per MD review of lab work today including heme, plt and SGOT- order given for ok to treat despite labs.  Pt will be transfused with PRBC's later this week. Chemo dose lowered per SGOT.  This RN called above to charge nurse in treatment room.

## 2016-02-29 LAB — CANCER ANTIGEN 27.29: CA 27.29: 2475.3 U/mL — ABNORMAL HIGH (ref 0.0–38.6)

## 2016-03-01 ENCOUNTER — Encounter: Payer: Self-pay | Admitting: Radiation Oncology

## 2016-03-01 ENCOUNTER — Ambulatory Visit: Payer: Medicare Other

## 2016-03-01 ENCOUNTER — Ambulatory Visit
Admission: RE | Admit: 2016-03-01 | Discharge: 2016-03-01 | Disposition: A | Discharge status: Home / Self Care | Payer: Medicare Other | Source: Ambulatory Visit | Attending: Radiation Oncology | Admitting: Radiation Oncology

## 2016-03-01 ENCOUNTER — Other Ambulatory Visit: Payer: Self-pay | Admitting: *Deleted

## 2016-03-01 VITALS — BP 127/66 | HR 121 | Temp 98.9°F | Ht 63.0 in | Wt 167.2 lb

## 2016-03-01 DIAGNOSIS — R262 Difficulty in walking, not elsewhere classified: Secondary | ICD-10-CM | POA: Insufficient documentation

## 2016-03-01 DIAGNOSIS — R918 Other nonspecific abnormal finding of lung field: Secondary | ICD-10-CM | POA: Insufficient documentation

## 2016-03-01 DIAGNOSIS — E119 Type 2 diabetes mellitus without complications: Secondary | ICD-10-CM | POA: Diagnosis not present

## 2016-03-01 DIAGNOSIS — G893 Neoplasm related pain (acute) (chronic): Secondary | ICD-10-CM | POA: Diagnosis not present

## 2016-03-01 DIAGNOSIS — D649 Anemia, unspecified: Secondary | ICD-10-CM

## 2016-03-01 DIAGNOSIS — Z853 Personal history of malignant neoplasm of breast: Secondary | ICD-10-CM | POA: Insufficient documentation

## 2016-03-01 DIAGNOSIS — K8020 Calculus of gallbladder without cholecystitis without obstruction: Secondary | ICD-10-CM | POA: Diagnosis not present

## 2016-03-01 DIAGNOSIS — R531 Weakness: Secondary | ICD-10-CM | POA: Diagnosis not present

## 2016-03-01 DIAGNOSIS — D869 Sarcoidosis, unspecified: Secondary | ICD-10-CM | POA: Insufficient documentation

## 2016-03-01 DIAGNOSIS — I517 Cardiomegaly: Secondary | ICD-10-CM | POA: Insufficient documentation

## 2016-03-01 DIAGNOSIS — I119 Hypertensive heart disease without heart failure: Secondary | ICD-10-CM | POA: Diagnosis not present

## 2016-03-01 DIAGNOSIS — Z85850 Personal history of malignant neoplasm of thyroid: Secondary | ICD-10-CM | POA: Insufficient documentation

## 2016-03-01 DIAGNOSIS — C787 Secondary malignant neoplasm of liver and intrahepatic bile duct: Secondary | ICD-10-CM | POA: Insufficient documentation

## 2016-03-01 DIAGNOSIS — R590 Localized enlarged lymph nodes: Secondary | ICD-10-CM | POA: Insufficient documentation

## 2016-03-01 DIAGNOSIS — C7951 Secondary malignant neoplasm of bone: Secondary | ICD-10-CM | POA: Diagnosis not present

## 2016-03-01 MED ORDER — LORAZEPAM 1 MG PO TABS: 1.0000 mg | ORAL_TABLET | Freq: Three times a day (TID) | ORAL | 0 refills | Status: AC

## 2016-03-01 NOTE — Progress Notes (Signed)
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 Started eribulin 02/28/2016 at reduced doses because of elevated liver function tests  Pain on a scale of 0-10 is: 5 - she reports generalized pain.  She is taking dilaudid and methadone.  Ambulatory status? Walker? Wheelchair?: walker  SAFETY ISSUES: Prior radiation?palliative radiation 08/23/2015-09/06/2015: Pelvis, right shoulder and left proximal femur were all treated to 30 GY in 10 fractions at 3 Gy per fraction, Left chest wall / 50.4 Gray @ 1.8 [place] [employer] per fraction x 28 fractions Left Supraclavicular fossa / 45 Gray @1 .8 Gray per fraction x 25 fractions, Left PAB / 45 Gy at 1.8 Gray per fraction x 25 fractions, Left scar / 10 Gray at [employer] per fraction x 5 fractions  Pacemaker/ICD? no  Possible current pregnancy? no  Is the patient on methotrexate? no  Additional Complaints / other details:  Patient reports having blurry vision while taking decadron.  She said her vision cleared after she stopped taking it.  She reports seeing floaters for awhile.  She is here with her mother, best friend, Theme park manager, 2 sons and granddaughter.  BP 127/66 (BP Location: Right Arm, Patient Position: Sitting)   Pulse (!) 121   Temp 98.9 F (37.2 C) (Oral)   Ht 5' 3"  (1.6 m)   Wt 167 lb 3.2 oz (75.8 kg)   LMP 12/07/2014 Comment: spotting  SpO2 94%   BMI 29.62 kg/m    Wt Readings from Last 3 Encounters:  03/01/16 167 lb 3.2 oz (75.8 kg)  02/28/16 170 lb 4.8 oz (77.2 kg)  02/23/16 179 lb (81.2 kg)

## 2016-03-01 NOTE — Progress Notes (Signed)
Radiation Oncology         ([phone]) [phone] ________________________________  Name: Shelly Hall MRN: [id number]  Date: 03/01/2016  DOB: Jul 01, 1970  Re-Consulation Visit Note  CC: Shelly Peers, MD  Shelly Hall, Shelly Dad, MD    ICD-9-CM ICD-10-CM   1. 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  06/23/2014-08/11/2014: Left chest wall / 50.4 Gray @ 1.8 Gray per fraction x 28 fractions Left Supraclavicular fossa / 45 Gray @1 .8 Gray per fraction x 25 fractions Left PAB / 45 Gy at 1.8 Gray per fraction x 25 fractions Left scar / 10 Gray at [employer] per fraction x 5 fractions  Narrative:  The patient returns today for a re-consultation. The patient is being treated palliatively by Dr. Jana Hall for metastatic breast cancer. 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 An eye specialist at [facility], Dr. Cordelia Hall was contacted for a referral, but an appointment is not available until 03/24/16. Dr. Jana Hall was then contacted about the patient's metastasis. The patient has been referred today to discuss palliative radiation to prevent possible loss of vision in the future.  MRI of the brain on 02/19/16 showed extensive osseous metastatic disease to the calvarium, skull base, and upper cervical region. Pachymeningeal enhancement is greatest in the left frontal bone reflecting the adjacent osseous disease. No lesions noted in the orbital area  Of note, the patient started palliative Eribulin on 02/28/16 at reduced doses because of elevated liver function tests and Zolendronate for bone health on 09/01/15.  The patient denies numbness of her tongue or  difficulty with controlling her tongue. She reports a poor appetite and severe abdominal/right pelvic pain. She also has generalized pain as a 5/10 controlled with Dilaudid and Methadone. The patient reports having blurry vision while taking decadron. She said her vision cleared after she stopped taking it. She reports seeing floaters for awhile. The patient also reports anxiety. She is here with her mother, best friend, Shelly Hall, 2 sons and granddaughter.  ALLERGIES:  is allergic to nyquil multi-symptom [pseudoeph-doxylamine-dm-apap] and allegra-d [fexofenadine-pseudoephed er].  Meds: Current Outpatient Prescriptions  Medication Sig Dispense Refill  . amphetamine-dextroamphetamine (ADDERALL) 10 MG tablet Take 1 tablet (10 mg total) by mouth 2 (two) times daily with a meal. 60 tablet 0  . carvedilol (COREG) 12.5 MG tablet Take 1 tablet (12.5 mg total) by mouth 2 (two) times daily with a meal. 60 tablet 0  . ferrous sulfate 325 (65 FE) MG tablet Take 325 mg by mouth daily with breakfast.     . FLUoxetine (PROZAC) 20 MG tablet Take 20 mg by mouth daily.     Marland Kitchen gabapentin (NEURONTIN) 300 MG capsule Take 300-600 mg by mouth 3 (three) times daily. Pt takes one capsule in the morning, one in the afternoon, and two at bedtime.    Marland Kitchen HYDROmorphone (DILAUDID) 4 MG tablet Take 1 tablet (4 mg total) by mouth every 2 (two) hours as needed for severe pain. 120 tablet 0  . lidocaine (LIDODERM) 5 % Place 2 patches onto the skin daily. Remove & Discard patch within 12 hours or as directed by MD 30  patch 0  . metFORMIN (GLUCOPHAGE) 1000 MG tablet Take 1,000 mg by mouth 2 (two) times daily with a meal.     . methadone (DOLOPHINE) 5 MG tablet Take 5 tablets (25 mg total) by mouth every 8 (eight) hours. 225 tablet 0  . omeprazole (PRILOSEC) 40 MG capsule Take 1 capsule (40 mg total) by mouth daily. 90 capsule 12  . polyethylene glycol (MIRALAX / GLYCOLAX) packet Take 17 g by mouth daily.    . prochlorperazine (COMPAZINE)  10 MG tablet Take 1 tablet (10 mg total) by mouth every 6 (six) hours as needed for nausea or vomiting. 30 tablet 0  . thiamine 100 MG tablet Take 1 tablet (100 mg total) by mouth daily. 30 tablet 0  . LORazepam (ATIVAN) 1 MG tablet Take 1 tablet (1 mg total) by mouth every 8 (eight) hours. 30 tablet 0   No current facility-administered medications for this encounter.     Physical Findings: The patient is in no acute distress. Patient is alert and oriented.  height is 5\' 3"  (1.6 m) and weight is 167 lb 3.2 oz (75.8 kg). Her oral temperature is 98.9 F (37.2 C). Her blood pressure is 127/66 and her pulse is 121 (abnormal). Her oxygen saturation is 94%.   The patient presents to the clinic with a wheelchair, but uses a walker at home. Neurologic exam is non-focal and cranial nerves II-XII intact. Visual fields appear to be intact on exam.  Lab Findings: Lab Results  Component Value Date   WBC 4.8 02/28/2016   HGB 7.5 (L) 02/28/2016   HCT 23.3 (L) 02/28/2016   MCV 95.5 02/28/2016   PLT 76 (L) 02/28/2016    Radiographic Findings: Dg Chest 2 View  Result Date: 02/06/2016 CLINICAL DATA:  Productive cough and shortness of breath. History of metastatic breast cancer. EXAM: CHEST  2 VIEW COMPARISON:  PA and lateral chest 01/07/2016.  CT chest 01/17/2016. FINDINGS: Lung volumes are lower than on the prior chest film with associated basilar atelectasis. Peribronchial thickening is noted. No pneumothorax or pleural effusion. Heart size is normal. Surgical clips left axilla are noted. Extensive osseous sclerosis consistent with metastatic disease is seen. Remote fracture distal right clavicle is identified. IMPRESSION: Bronchitic change without focal process. Extensive osseous metastatic disease. Electronically Signed   By: Inge Rise M.D.   On: 02/06/2016 18:13   Ct Angio Chest Pe W And/or Wo Contrast  Result Date: 02/06/2016 CLINICAL DATA:  45 year old female with history of breast cancer.  Rule out PE. EXAM: CT ANGIOGRAPHY CHEST CT ABDOMEN AND PELVIS WITH CONTRAST TECHNIQUE: Multidetector CT imaging of the chest was performed using the standard protocol during bolus administration of intravenous contrast. Multiplanar CT image reconstructions and MIPs were obtained to evaluate the vascular anatomy. Multidetector CT imaging of the abdomen and pelvis was performed using the standard protocol during bolus administration of intravenous contrast. CONTRAST:  100 cc Isovue 370 COMPARISON:  Chest CT dated 01/17/2016, abdominal CT dated 08/19/2015 and bone scan dated 01/10/2016 FINDINGS: CTA CHEST FINDINGS There is shallow inspiration. Minimal bibasilar dependent atelectatic changes. Diffuse airspace ground-glass density, likely related to atelectatic changes and hypovolemia. There is apparent diffuse minimal interlobular septal thickening with Kerley B-lines, likely mild pulmonary edema. Small faint nodular densities along the bronchovascular interstitium predominantly in the right upper and middle lobes noted. Although these may be inflammatory/infectious in etiology, an early pulmonary metastases or lymphangitic spread of the tumor is not entirely excluded. Clinical correlation and follow-up recommended. There is  no pleural effusion or pneumothorax. The central airways are patent. The thoracic aorta appears unremarkable. The origins of the great vessels of the aortic arch appear patent. There is no CT evidence of pulmonary embolism. There is mild cardiomegaly. No pericardial effusion. There are bilateral hilar adenopathy. The largest lymph nodes measure up to 2.7 x 1.7 cm (previously 1.9 x 1.9 cm) in the right infrahilar region. Anterior mediastinal adenopathy in the prevascular space noted similar to prior study. Right posterior mediastinal/subcarinal adenopathy measures 2.3 x 2.3 cm. The esophagus is grossly unremarkable. Left thyroidectomy. Left axillary surgical clips noted. No axillary adenopathy  identified. Left-sided mastectomy. The chest wall soft tissues are otherwise unremarkable. There is extensive osseous metastatic disease. There is nondisplaced fracture of the right posterior fourth and tenth ribs as seen on the prior CT, likely pathologic fracture. No new fracture. T1 and T8 compression fracture deformity similar to prior study. CT ABDOMEN and PELVIS FINDINGS No intra-abdominal free air or free fluid. There are extensive hepatic hypodense lesions compatible with metastatic disease. There is associated irregular appearance of the hepatic contour. There multiple small stones within the gallbladder. There is thickened appearance of the gallbladder wall. Ultrasound may provide better evaluation of the gallbladder if an acute cholecystitis is clinically suspected. The pancreas, spleen, adrenal glands, kidneys, visualized ureters, and urinary bladder appear unremarkable. Hysterectomy. There is moderate stool throughout the colon. No evidence of bowel obstruction or active inflammation. Normal appendix. There is mild aortoiliac atherosclerotic disease. The origins of the celiac axis, SMA, IMA as well as the origins of the renal arteries appear patent. The the IVC, SMV, splenic vein, and main portal vein are patent. No portal venous gas identified. Upper abdominal adenopathy in the gastrohepatic space, and portacaval adenopathy. The abdominal wall soft tissues appear unremarkable. There is extensive osseous metastatic disease. There is pathologic compression fracture of the L5 vertebra with more than 50% loss of vertebral body height. There is retropulsed fracture fragment from the superior posterior corner of the L5 vertebral with moderate focal narrowing of the central canal. There is disc desiccation with vacuum phenomena at L4-5 and L5-S1. Overall there has been interval progression of osseous metastatic disease compared to the study dated 08/19/2015. Review of the MIP images confirms the above  findings. IMPRESSION: No CT evidence of pulmonary embolism. Mild atelectatic changes with probable mild pulmonary edema. Small nodular densities predominantly in the right upper and middle lobes may be inflammatory or infectious in etiology. Metastatic disease or less likely early lymphangitic spread of tumor are not excluded. Clinical correlation and follow-up recommended. Interval progression of hilar and mediastinal adenopathy. Extensive hepatic metastatic disease. Small gallstones with diffuse gallbladder wall thickening. Ultrasound may provide better evaluation of the gallbladder if an acute cholecystitis is clinically suspected. Upper abdominal adenopathy. Extensive osseous metastatic disease with overall progression since the prior study. Multilevel compression deformities. L5 pathologic compression fracture, new from study dated 08/19/2015 with associated retropulsed fragment and focal narrowing of the central canal. Electronically Signed   By: Anner Crete M.D.   On: 02/06/2016 22:34   Mr Jeri Cos [id number] Contrast  Result Date: 02/19/2016 CLINICAL DATA:  Breast cancer, bone metastases, pain, confusion, and difficulty walking. Weakness. EXAM: MRI HEAD WITHOUT AND WITH CONTRAST TECHNIQUE: Multiplanar, multiecho pulse sequences of the brain and surrounding structures were obtained without and with intravenous contrast. CONTRAST:  52mL MULTIHANCE GADOBENATE DIMEGLUMINE 529 MG/ML IV SOLN COMPARISON:  No prior brain imaging is available. FINDINGS: Brain: No evidence for acute infarction, hemorrhage,  mass lesion, hydrocephalus, or extra-axial fluid. Normal cerebral volume. No significant white matter disease. Post infusion, No intra-axial or leptomeningeal enhancement. Diffuse pachymeningeal enhancement is greatest over the LEFT convexity, associated with asymmetric osseous disease in this region. Vascular: Flow voids are maintained throughout the carotid, basilar, and vertebral arteries. There are no areas of  chronic hemorrhage. Skull and upper cervical spine: Extensive osseous metastatic disease throughout the calvarium, skullbase, and upper cervical region. This is greatest in the LEFT frontal bone. A discrete epidural mass is not clearly present, but disruption of the inner table of the skull is noted focally as seen on image 16 series 7 and image 41 series 5. Sinuses/Orbits: Layering fluid in the RIGHT maxillary and both divisions of the sphenoid sinuses. RIGHT frontal sinus fluid is also evident. No orbital findings. Other: No nasopharyngeal pathology or mastoid fluid. Scalp and other visualized extracranial soft tissues grossly unremarkable. IMPRESSION: Extensive osseous metastatic disease to calvarium, skullbase, and upper cervical region. Pachymeningeal enhancement is greatest in the LEFT frontal bone, reflecting the adjacent osseous disease. No epidural tumor in the upper spine. No intra-axial metastatic deposits or leptomeningeal spread of tumor. Acute sinusitis. Electronically Signed   By: Staci Righter M.D.   On: 02/19/2016 14:25   Nm Hepatobiliary Liver Func  Result Date: 02/08/2016 CLINICAL DATA:  Right upper quadrant pain . EXAM: NUCLEAR MEDICINE HEPATOBILIARY IMAGING TECHNIQUE: Sequential images of the abdomen were obtained out to 60 minutes following intravenous administration of radiopharmaceutical. RADIOPHARMACEUTICALS:  5.1 mCi Tc-50m  Choletec IV COMPARISON:  Ultrasound 02/06/2016.  CT 02/06/2016. FINDINGS: Liver visualizes normally. Gallbladder activity is visualized, consistent with patency of cystic duct. Biliary activity passes into small bowel, consistent with patent common bile duct. IMPRESSION: Normal exam.  Gallbladder visualizes normally. Electronically Signed   By: Marcello Moores  Register   On: 02/08/2016 13:24   Ct Abdomen Pelvis W Contrast  Result Date: 02/06/2016 CLINICAL DATA:  45 year old female with history of breast cancer. Rule out PE. EXAM: CT ANGIOGRAPHY CHEST CT ABDOMEN AND PELVIS  WITH CONTRAST TECHNIQUE: Multidetector CT imaging of the chest was performed using the standard protocol during bolus administration of intravenous contrast. Multiplanar CT image reconstructions and MIPs were obtained to evaluate the vascular anatomy. Multidetector CT imaging of the abdomen and pelvis was performed using the standard protocol during bolus administration of intravenous contrast. CONTRAST:  100 cc Isovue 370 COMPARISON:  Chest CT dated 01/17/2016, abdominal CT dated 08/19/2015 and bone scan dated 01/10/2016 FINDINGS: CTA CHEST FINDINGS There is shallow inspiration. Minimal bibasilar dependent atelectatic changes. Diffuse airspace ground-glass density, likely related to atelectatic changes and hypovolemia. There is apparent diffuse minimal interlobular septal thickening with Kerley B-lines, likely mild pulmonary edema. Small faint nodular densities along the bronchovascular interstitium predominantly in the right upper and middle lobes noted. Although these may be inflammatory/infectious in etiology, an early pulmonary metastases or lymphangitic spread of the tumor is not entirely excluded. Clinical correlation and follow-up recommended. There is no pleural effusion or pneumothorax. The central airways are patent. The thoracic aorta appears unremarkable. The origins of the great vessels of the aortic arch appear patent. There is no CT evidence of pulmonary embolism. There is mild cardiomegaly. No pericardial effusion. There are bilateral hilar adenopathy. The largest lymph nodes measure up to 2.7 x 1.7 cm (previously 1.9 x 1.9 cm) in the right infrahilar region. Anterior mediastinal adenopathy in the prevascular space noted similar to prior study. Right posterior mediastinal/subcarinal adenopathy measures 2.3 x 2.3 cm. The esophagus is grossly unremarkable.  Left thyroidectomy. Left axillary surgical clips noted. No axillary adenopathy identified. Left-sided mastectomy. The chest wall soft tissues are  otherwise unremarkable. There is extensive osseous metastatic disease. There is nondisplaced fracture of the right posterior fourth and tenth ribs as seen on the prior CT, likely pathologic fracture. No new fracture. T1 and T8 compression fracture deformity similar to prior study. CT ABDOMEN and PELVIS FINDINGS No intra-abdominal free air or free fluid. There are extensive hepatic hypodense lesions compatible with metastatic disease. There is associated irregular appearance of the hepatic contour. There multiple small stones within the gallbladder. There is thickened appearance of the gallbladder wall. Ultrasound may provide better evaluation of the gallbladder if an acute cholecystitis is clinically suspected. The pancreas, spleen, adrenal glands, kidneys, visualized ureters, and urinary bladder appear unremarkable. Hysterectomy. There is moderate stool throughout the colon. No evidence of bowel obstruction or active inflammation. Normal appendix. There is mild aortoiliac atherosclerotic disease. The origins of the celiac axis, SMA, IMA as well as the origins of the renal arteries appear patent. The the IVC, SMV, splenic vein, and main portal vein are patent. No portal venous gas identified. Upper abdominal adenopathy in the gastrohepatic space, and portacaval adenopathy. The abdominal wall soft tissues appear unremarkable. There is extensive osseous metastatic disease. There is pathologic compression fracture of the L5 vertebra with more than 50% loss of vertebral body height. There is retropulsed fracture fragment from the superior posterior corner of the L5 vertebral with moderate focal narrowing of the central canal. There is disc desiccation with vacuum phenomena at L4-5 and L5-S1. Overall there has been interval progression of osseous metastatic disease compared to the study dated 08/19/2015. Review of the MIP images confirms the above findings. IMPRESSION: No CT evidence of pulmonary embolism. Mild  atelectatic changes with probable mild pulmonary edema. Small nodular densities predominantly in the right upper and middle lobes may be inflammatory or infectious in etiology. Metastatic disease or less likely early lymphangitic spread of tumor are not excluded. Clinical correlation and follow-up recommended. Interval progression of hilar and mediastinal adenopathy. Extensive hepatic metastatic disease. Small gallstones with diffuse gallbladder wall thickening. Ultrasound may provide better evaluation of the gallbladder if an acute cholecystitis is clinically suspected. Upper abdominal adenopathy. Extensive osseous metastatic disease with overall progression since the prior study. Multilevel compression deformities. L5 pathologic compression fracture, new from study dated 08/19/2015 with associated retropulsed fragment and focal narrowing of the central canal. Electronically Signed   By: Anner Crete M.D.   On: 02/06/2016 22:34   Mr Liver W Wo Contrast  Result Date: 02/19/2016 CLINICAL DATA:  Breast cancer with the liver metastasis. EXAM: MRI ABDOMEN WITHOUT AND WITH CONTRAST TECHNIQUE: Multiplanar multisequence MR imaging of the abdomen was performed both before and after the administration of intravenous contrast. CONTRAST:  15 mL MultiHance COMPARISON:  CT 02/06/2016 FINDINGS: Lower chest:  Lung bases are clear. Hepatobiliary: Multiple round lesions within LEFT and RIGHT hepatic lobe which hyperintense on T2 weighted imaging consistent with liver metastasis. Lesions demonstrate heterogeneous enhancement. Example lesion in the central LEFT hepatic lobe measures 3.4 by 3.3 cm (image 16, series 3). Lesion in the inferior RIGHT hepatic lobe measures 3.9 by 2.6 cm (image 26, series 3). There are approximately 30 lesions within each hepatic lobe. No biliary duct dilatation. Gallbladder is collapsed around sludge or gallstones. Common bile duct normal caliber. Pancreas: Normal pancreatic parenchymal intensity.  No ductal dilatation or inflammation. Spleen: Normal spleen. Adrenals/urinary tract: Adrenal glands and kidneys are normal. Stomach/Bowel: Stomach  and limited of the small bowel is unremarkable Vascular/Lymphatic: Heterogeneous marrow enhancement and signal abnormality consistent with skeletal metastasis. Musculoskeletal: No aggressive osseous lesion IMPRESSION: 1. Widespread hepatic metastasis with lesions occupying the majority of the liver parenchyma. 2. Ill-defined but diffuse skeletal marrow metastasis. Electronically Signed   By: Suzy Bouchard M.D.   On: 02/19/2016 14:20   Dg Chest Port 1 View  Result Date: 02/23/2016 CLINICAL DATA:  Port-A-Cath insertion history of breast carcinoma and sarcoidosis EXAM: PORTABLE CHEST 1 VIEW COMPARISON:  Chest radiograph and chest CT February 06, 2016 FINDINGS: Port-A-Cath tip is in the superior vena cava. No pneumothorax. There is atelectatic change in the bases. Lungs elsewhere clear. Heart is upper normal in size with pulmonary vascularity within normal limits. Soft tissue prominence is noted in the right peritracheal and right hilar regions, most likely due to adenopathy. There are surgical clips in the left axilla. There is evidence of prior fracture of the lateral right clavicle there are multiple foci of bony metastatic disease, stable. There are surgical clips in the left cervical-thoracic junction region. There is calcification in the left carotid artery. IMPRESSION: Port-A-Cath tip in superior vena cava. No pneumothorax. Bibasilar atelectasis. Right hilar and paratracheal adenopathy. Question adenopathy due to sarcoidosis versus neoplasm. There are bony metastases. Surgical clips noted in left cervical-thoracic region and left axilla. There is calcification in the left carotid artery. Electronically Signed   By: Lowella Grip III M.D.   On: 02/23/2016 15:29   Dg Fluoro Guide Cv Line-no Report  Result Date: 02/23/2016 CLINICAL DATA:  FLOURO GUIDE CV  LINE Fluoroscopy was utilized by the requesting physician.  No radiographic interpretation.   US Abdomen Limited Ruq  Result Date: 02/06/2016 CLINICAL DATA:  RIGHT upper quadrant pain for 3 days, history hypertension, sarcoidosis, type II diabetes mellitus, thyroid cancer, breast cancer EXAM: US ABDOMEN LIMITED - RIGHT UPPER QUADRANT COMPARISON:  CT abdomen pelvis 02/06/2016 FINDINGS: Gallbladder: Thickened gallbladder wall. Dependent shadowing calculi. Small gallbladder polyps noted. Sonographic Murphy sign present. No definite pericholecystic fluid. Common bile duct: Diameter: 6 mm diameter, upper normal Liver: Markedly heterogeneous appearance of liver parenchyma with multiple rounded areas of hypo echogenicity compatible with widespread hepatic metastases as noted on earlier CT. Hepatopetal portal venous flow. No RIGHT upper quadrant free fluid. IMPRESSION: Thickened gallbladder wall with gallstones, gallbladder polyps, and sonographic Murphy sign suspicious for acute cholecystitis. Widespread hepatic metastatic disease. Electronically Signed   By: Lavonia Dana M.D.   On: 02/06/2016 22:50    Impression: Stage IV (TX, NX, M1) left breast cancer  The patient is having some pain in the right groin area that may be related to her osseous metastasis. May need to consider a short course of palliative radiation to this area.  Plan: Will discuss potential treatment to the oribtal areas with Dr. Jana Hall although no lesions were noted on the most recent MRI and the patient is not having any visual changes at this time. We will try to obtain office notes from Dr. Avelino Leeds office documenting this issue.  For the patient's anxiety, I filled 1 mg Ativan (30 tablets) every 8 hours with no refill.  ____________________________________ -----------------------------------  Blair Promise, PhD, MD  This document serves as a record of services personally performed by Gery Pray, MD. It was created on his behalf  by Darcus Austin, a trained medical scribe. The creation of this record is based on the scribe's personal observations and the provider's statements to them. This document has been checked and  approved by the attending provider.

## 2016-03-02 ENCOUNTER — Ambulatory Visit (HOSPITAL_COMMUNITY)
Admission: RE | Admit: 2016-03-02 | Discharge: 2016-03-02 | Disposition: A | Discharge status: Home / Self Care | Payer: Medicare Other | Source: Ambulatory Visit | Attending: Family Medicine | Admitting: Family Medicine

## 2016-03-02 ENCOUNTER — Other Ambulatory Visit: Payer: Self-pay | Admitting: *Deleted

## 2016-03-02 DIAGNOSIS — R530 Neoplastic (malignant) related fatigue: Secondary | ICD-10-CM

## 2016-03-02 DIAGNOSIS — D649 Anemia, unspecified: Secondary | ICD-10-CM

## 2016-03-02 DIAGNOSIS — C7951 Secondary malignant neoplasm of bone: Secondary | ICD-10-CM | POA: Diagnosis not present

## 2016-03-02 DIAGNOSIS — C50812 Malignant neoplasm of overlapping sites of left female breast: Secondary | ICD-10-CM | POA: Diagnosis not present

## 2016-03-02 LAB — PREPARE RBC (CROSSMATCH)

## 2016-03-02 MED ORDER — DIPHENHYDRAMINE HCL 25 MG PO CAPS
25.0000 mg | ORAL_CAPSULE | Freq: Once | ORAL | Status: AC
  Administered 2016-03-02: 25 mg via ORAL
  Filled 2016-03-02: qty 1

## 2016-03-02 MED ORDER — SODIUM CHLORIDE 0.9% FLUSH: 3.0000 mL | INTRAVENOUS | Status: DC | PRN

## 2016-03-02 MED ORDER — HEPARIN SOD (PORK) LOCK FLUSH 100 UNIT/ML IV SOLN
500.0000 [IU] | Freq: Every day | INTRAVENOUS | Status: AC | PRN
  Administered 2016-03-02: 500 [IU]
  Filled 2016-03-02: qty 5

## 2016-03-02 MED ORDER — SODIUM CHLORIDE 0.9 % IV SOLN
250.0000 mL | Freq: Once | INTRAVENOUS | Status: AC
  Administered 2016-03-02: 250 mL via INTRAVENOUS

## 2016-03-02 MED ORDER — HEPARIN SOD (PORK) LOCK FLUSH 100 UNIT/ML IV SOLN: 250.0000 [IU] | INTRAVENOUS | Status: DC | PRN

## 2016-03-02 MED ORDER — ACETAMINOPHEN 325 MG PO TABS
650.0000 mg | ORAL_TABLET | Freq: Once | ORAL | Status: AC
  Administered 2016-03-02: 650 mg via ORAL
  Filled 2016-03-02: qty 2

## 2016-03-02 MED ORDER — SODIUM CHLORIDE 0.9% FLUSH: 10.0000 mL | INTRAVENOUS | Status: DC | PRN

## 2016-03-02 NOTE — Addendum Note (Signed)
Encounter addended by: Jacqulyn Liner, RN on: 03/02/2016  9:54 AM<BR>    Actions taken: Charge Capture section accepted

## 2016-03-02 NOTE — Procedures (Signed)
[facility]  Procedure Note  QUANEISHA APOLLO [name] DOB: [birth year] DOA: 03/02/2016   PCP: Magrin[facility]  Associated Diagnosis: Symptomatic Anemia  Procedure Note: Transfusion of 2 units PRBCs   Condition During Procedure: Pt tolerated well; no complications noted  Condition at Discharge: Pt accompanied by family; pt drowsy, oriented   Nigel Sloop, [facility]

## 2016-03-02 NOTE — Discharge Instructions (Signed)
Blood Transfusion, Care After °Refer to this sheet in the next few weeks. These instructions provide you with information about caring for yourself after your procedure. Your health care provider may also give you more specific instructions. Your treatment has been planned according to current medical practices, but problems sometimes occur. Call your health care provider if you have any problems or questions after your procedure. °WHAT TO EXPECT AFTER THE PROCEDURE °After your procedure, it is common to have: °· Bruising and soreness at the IV site. °· Chills or fever. °· Headache. °HOME CARE INSTRUCTIONS °· Take medicines only as directed by your health care provider. Ask your health care provider if you can take an over-the-counter pain reliever in case you have a fever or headache a day or two after your transfusion. °· Return to your normal activities as directed by your health care provider. °SEEK MEDICAL CARE IF:  °· You develop redness or irritation at your IV site. °· You have persistent fever, chills, or headache. °· Your urine is darker than normal. °· Your urine turns pink, red, or brown.   °· The white part of your eye turns yellow (jaundice).   °· You feel weak after doing your normal activities.   °SEEK IMMEDIATE MEDICAL CARE IF:  °· You have trouble breathing. °· You have fever and chills along with: °¨ Anxiety. °¨ Chest or back pain. °¨ Flushed skin. °¨ Clammy skin. °¨ A rapid heartbeat. °¨ Nausea. °  °This information is not intended to replace advice given to you by your health care provider. Make sure you discuss any questions you have with your health care provider. °  °Document Released: 06/12/2014 Document Reviewed: 06/12/2014 °Elsevier Interactive Patient Education ©2016 Elsevier Inc. ° °

## 2016-03-03 LAB — TYPE AND SCREEN
ABO/RH(D): B POS
ANTIBODY SCREEN: NEGATIVE
UNIT DIVISION: 0
UNIT DIVISION: 0

## 2016-03-05 DIAGNOSIS — C50412 Malignant neoplasm of upper-outer quadrant of left female breast: Secondary | ICD-10-CM | POA: Diagnosis not present

## 2016-03-06 ENCOUNTER — Ambulatory Visit: Payer: Medicare Other

## 2016-03-06 ENCOUNTER — Telehealth: Payer: Self-pay | Admitting: *Deleted

## 2016-03-06 ENCOUNTER — Other Ambulatory Visit: Payer: Medicare Other

## 2016-03-06 ENCOUNTER — Ambulatory Visit: Payer: Medicare Other | Admitting: Oncology

## 2016-03-06 NOTE — Telephone Encounter (Signed)
"  Ms. Gwenlyn Perking with [facility] calling to let you know Ms.Kanouse will not be in today.  Thursday's blood transfusion did not help.  Since Thursday, all she does is sleep.  Can barely make it to the bedside commode.  Has pain to her back and legs.  Weak and does not feel like moving.  Her mother and youngest son will be there.  The son has questions and has never been able to come in.  They want to talk with Dr. Jana Hakim.

## 2016-03-06 NOTE — Telephone Encounter (Signed)
This RN spoke with Shelly Hall - who is Shelly Hall's caregiver as well as mother to Shelly Hall - who also provides care for Shelly Hall.  Shelly Hall has accompanied Shelly Hall to all her appointments and relates her relationship as " her mother ".  Per discussion Shelly Hall states due to Shelly Hall's continuing decline they would like to stay at home and have hospice come in.  This RN discussed above including concern for symptom management that could be assessed it Shelly Hall could be brought in by EMS.  Shelly Hall states " I have asked Shelly Hall what she wanted to do and she does not want to go to the hospital and would prefer to stay at home - and have hospice come in as discussed by Dr Jana Hakim at our visit last week "  Shelly Hall then stated " Shelly Hall is right here and you can speak with her "  This  RN spoke with Shelly Hall per above with Shelly Hall who verbalized agreement per above.  This RN then spoke with Shelly Hall again per note stating mother and son would like to come for for the scheduled appointment to discuss concerns with MD.  Shelly Hall - Shelly Hall's " real mother and her youngest son Shelly Hall will be coming " " they have a lot of questions and Shelly Hall has not met Dr Jana Hakim ".  Plan per call is to review above with MD- and proceed with hospice referral .  Per MD above appropriate as well as he will speak with family when they come in per above.

## 2016-03-10 ENCOUNTER — Other Ambulatory Visit: Payer: Self-pay | Admitting: *Deleted

## 2016-03-10 ENCOUNTER — Telehealth: Payer: Self-pay

## 2016-03-10 MED ORDER — HYDROMORPHONE HCL 4 MG PO TABS: 4.0000 mg | ORAL_TABLET | ORAL | 0 refills | Status: DC | PRN

## 2016-03-10 MED ORDER — METHADONE HCL 5 MG PO TABS: 25.0000 mg | ORAL_TABLET | Freq: Three times a day (TID) | ORAL | 0 refills | Status: DC

## 2016-03-10 NOTE — Telephone Encounter (Signed)
[facility] called for methadone and dilaudid refills.  She asked about lab work that needs to be drawn by visiting nurse. She asked about an IV at home for nourishment. Pt is only drinking coke and water, she is not eating anything. Kenney Houseman is her hospice nurse.

## 2016-03-10 NOTE — Telephone Encounter (Signed)
During first call this morning asking for methadone and dilaudid, the family did request pharmacy change to [facility] in [place]. I forgot to mention this in the message. Will correct this now. S/w Val and she will refax to [facility]. I will call [facility] outpatient pharmacy to let them know.

## 2016-03-10 NOTE — Telephone Encounter (Signed)
This RN also received call from pt's [facility] stating concerns verbalized to her by family. Shelly Hall has not met Shelly Hall and has interacted with Shelly Hall - Shelly Hall's mother who is caring for pt.  Call returned to [facility] and obtained identified VM - detailed message left validating Shelly Hall's concerns as well as goal of care relating to comfort and well being.  Requested return call to this RN but also to discuss with hospice nurse.  This RN then spoke with [place] regarding concerns- includuding maintaining comfort without IV fluids or IV nutrition. Labs are not indicated now that pt is off Ibrance.  Goals verified so when communicating with family continuity of care is maintained.  Refills will be sent to pharmacy per request.

## 2016-03-16 ENCOUNTER — Other Ambulatory Visit: Payer: Self-pay | Admitting: *Deleted

## 2016-03-16 MED ORDER — METHADONE HCL 5 MG PO TABS: 25.0000 mg | ORAL_TABLET | Freq: Three times a day (TID) | ORAL | 0 refills | Status: AC

## 2016-03-16 NOTE — Telephone Encounter (Signed)
This RN was contacted by RN Kenney Houseman with [facility] stating need for refill on pt's methadone.  Noted fill last week written for 225 tablets - only 62 tablets were dispensed.  This RN contacted pt's [facility] per above and was informed they only had 62 tablets available.  Ideally [employer] does not obtain a large quanity of methadone and refills would need to be for lesser amounts.  Prescription for today written for 7 days of use and faxed .

## 2016-03-19 ENCOUNTER — Emergency Department (HOSPITAL_COMMUNITY)
Admission: EM | Admit: 2016-03-19 | Discharge: 2016-03-19 | Disposition: A | Discharge status: Home / Self Care | Attending: Emergency Medicine | Admitting: Emergency Medicine

## 2016-03-19 ENCOUNTER — Encounter (HOSPITAL_COMMUNITY): Payer: Self-pay | Admitting: Emergency Medicine

## 2016-03-19 DIAGNOSIS — W06XXXA Fall from bed, initial encounter: Secondary | ICD-10-CM | POA: Insufficient documentation

## 2016-03-19 DIAGNOSIS — F17210 Nicotine dependence, cigarettes, uncomplicated: Secondary | ICD-10-CM | POA: Diagnosis not present

## 2016-03-19 DIAGNOSIS — I10 Essential (primary) hypertension: Secondary | ICD-10-CM | POA: Insufficient documentation

## 2016-03-19 DIAGNOSIS — Y939 Activity, unspecified: Secondary | ICD-10-CM | POA: Insufficient documentation

## 2016-03-19 DIAGNOSIS — C419 Malignant neoplasm of bone and articular cartilage, unspecified: Secondary | ICD-10-CM | POA: Insufficient documentation

## 2016-03-19 DIAGNOSIS — C73 Malignant neoplasm of thyroid gland: Secondary | ICD-10-CM | POA: Insufficient documentation

## 2016-03-19 DIAGNOSIS — C799 Secondary malignant neoplasm of unspecified site: Secondary | ICD-10-CM

## 2016-03-19 DIAGNOSIS — G4489 Other headache syndrome: Secondary | ICD-10-CM | POA: Diagnosis not present

## 2016-03-19 DIAGNOSIS — R531 Weakness: Secondary | ICD-10-CM | POA: Diagnosis not present

## 2016-03-19 DIAGNOSIS — E1140 Type 2 diabetes mellitus with diabetic neuropathy, unspecified: Secondary | ICD-10-CM | POA: Diagnosis not present

## 2016-03-19 DIAGNOSIS — Z043 Encounter for examination and observation following other accident: Secondary | ICD-10-CM | POA: Diagnosis not present

## 2016-03-19 DIAGNOSIS — Y999 Unspecified external cause status: Secondary | ICD-10-CM | POA: Insufficient documentation

## 2016-03-19 DIAGNOSIS — C7981 Secondary malignant neoplasm of breast: Secondary | ICD-10-CM | POA: Diagnosis not present

## 2016-03-19 DIAGNOSIS — Y929 Unspecified place or not applicable: Secondary | ICD-10-CM | POA: Diagnosis not present

## 2016-03-19 DIAGNOSIS — C50912 Malignant neoplasm of unspecified site of left female breast: Secondary | ICD-10-CM | POA: Diagnosis not present

## 2016-03-19 DIAGNOSIS — W19XXXA Unspecified fall, initial encounter: Secondary | ICD-10-CM

## 2016-03-19 DIAGNOSIS — Z79899 Other long term (current) drug therapy: Secondary | ICD-10-CM | POA: Insufficient documentation

## 2016-03-19 DIAGNOSIS — R404 Transient alteration of awareness: Secondary | ICD-10-CM | POA: Diagnosis not present

## 2016-03-19 DIAGNOSIS — C228 Malignant neoplasm of liver, primary, unspecified as to type: Secondary | ICD-10-CM | POA: Diagnosis not present

## 2016-03-19 MED ORDER — HYDROMORPHONE HCL 1 MG/ML IJ SOLN
1.0000 mg | Freq: Once | INTRAMUSCULAR | Status: AC
  Administered 2016-03-19: 1 mg via INTRAVENOUS
  Filled 2016-03-19: qty 1

## 2016-03-19 NOTE — ED Notes (Signed)
Dr Ralene Bathe advises that hospice is en route to eval pt

## 2016-03-19 NOTE — ED Provider Notes (Signed)
[facility] DEPT Provider Note   CSN: [id number] Arrival date & time: 03/19/16  [id number]     History   Chief Complaint Chief Complaint  Patient presents with  . Fall    HPI Shelly Hall is a 45 y.o. female.  The history is provided by the patient and the EMS personnel. No language interpreter was used.  Fall     Shelly Hall is a 45 y.o. female who presents to the Emergency Department complaining of fall. Level V caveat due to confusion.  Per report she was at home and had a fall, striking her head. She has metastatic breast cancer. Patient denies any complaints in the emergency department.  Per family she's had progressive generalized weakness and difficulty with getting up out of bed to the bedside commode. Today she became weak in her knees and fell to the ground, striking her head. They report a gradual decline over the last several weeks.  Past Medical History:  Diagnosis Date  . Anemia   . Anxiety    no meds currently  . Breast cancer ([place]) 2015   left upper outer;  had chemo  . Bronchitis 2014  . Complication of anesthesia    makes pt. restless and unable to be still  . Depression    no meds currently  . GERD (gastroesophageal reflux disease)   . Heart murmur    never had any problems as adult  . High cholesterol   . Hypertension   . Radiation 06/23/14-08/11/14   Invasive ductal ca o left breast  . Sarcoidosis ([place])    no problems per patient   . SVD (spontaneous vaginal delivery)    x 2  . Thyroid cancer ([place]) 2015  . Type II diabetes mellitus [facility])     Patient Active Problem List   Diagnosis Date Noted  . Metastases to the liver ([place]) 02/27/2016  . Sepsis ([place]) 02/07/2016  . Acute cholecystitis 02/07/2016  . Leukopenia 02/07/2016  . Thrombocytopenia ([place]) 02/07/2016  . AKI (acute kidney injury) ([place]) 02/07/2016  . Lactic acidosis   . HCAP (healthcare-associated pneumonia)   . Cancer of overlapping sites of left breast ([place]) 01/23/2016  .  Bone metastases ([place]) 01/23/2016  . Diaphoresis   . Tachycardia   . Elevated d-dimer   . Abdominal distension   . Palliative care by specialist   . Neoplastic malignant related fatigue   . Arthralgia   . Uncontrolled pain 01/07/2016  . Intractable pain   . Palliative care encounter   . Diverticula of colon 09/28/2015  . Pain 09/28/2015  . Encounter for palliative care   . Cancer associated pain 09/18/2015  . Pain from bone metastases ([place]) 08/27/2015  . Pain due to malignant neoplasm metastatic to bone ([place]) 08/06/2015  . Genetic testing 04/09/2015  . Lymphedema of arm 02/18/2015  . S/P hysterectomy 12/09/2014  . Lymphedema 11/17/2014  . Gum symptoms 03/18/2014  . 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 Hot flashes 01/15/2014  . 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 Esophageal reflux 11/27/2013  . Rib pain 11/27/2013  . Recurrent genital herpes 11/06/2013  . Chemotherapy induced neutropenia ([place]) 11/06/2013  . Anemia, unspecified 11/06/2013  . Diabetes mellitus type II, non insulin dependent ([place]) 11/06/2013  . Left thyroid nodule 11/06/2013  . Sarcoidosis ([place]) 10/15/2013  . Mediastinal lymphadenopathy 11/21/2012  . Tobacco use disorder 11/21/2012    Past Surgical History:  Procedure Laterality Date  . ABDOMINAL HYSTERECTOMY    .  BREAST BIOPSY Left 2015 X 3   biopsy  . ENDOBRONCHIAL ULTRASOUND Bilateral 12/02/2012   Procedure: ENDOBRONCHIAL ULTRASOUND;  Surgeon: Collene Gobble, MD;  Location: [facility];  Service: Cardiopulmonary;  Laterality: Bilateral;  . LAPAROSCOPIC ASSISTED VAGINAL HYSTERECTOMY N/A 12/09/2014   Procedure: LAPAROSCOPIC ASSISTED VAGINAL HYSTERECTOMY;  Surgeon: Cheri Fowler, MD;  Location: [facility];  Service: Gynecology;  Laterality: N/A;  . LEEP    . LUNG BIOPSY  2014  . MASTECTOMY W/ SENTINEL NODE BIOPSY Left 04/23/2014   & axillary LND  . MASTECTOMY W/ SENTINEL NODE BIOPSY Left 04/23/2014   Procedure: LEFT  BREAST MASTECTOMY WITH SENTINEL LYMPH NODE BIOPSY AND AXILLARY LYMPH NODE DISECTION;  Surgeon: [name], MD;  Location: [facility];  Service: General;  Laterality: Left;  . PORT-A-CATH REMOVAL Left 02/03/2015   Procedure: REMOVAL PORT-A-CATH;  Surgeon: [name], MD;  Location: [facility];  Service: General;  Laterality: Left;  . PORTACATH PLACEMENT N/A 10/22/2013   Procedure: INSERTION PORT-A-CATH;  Surgeon: [name], MD;  Location: [facility];  Service: General;  Laterality: N/A;  . PORTACATH PLACEMENT N/A 02/23/2016   Procedure: INSERTION PORT-A-CATH;  Surgeon: [name], MD;  Location: [facility];  Service: General;  Laterality: N/A;  . SALPINGOOPHORECTOMY Bilateral 12/09/2014   Procedure: SALPINGO OOPHORECTOMY;  Surgeon: Cheri Fowler, MD;  Location: [facility];  Service: Gynecology;  Laterality: Bilateral;  . THYROID LOBECTOMY Left 04/23/2014   Procedure: LEFT THYROID LOBECTOMY;  Surgeon: [name], MD;  Location: [facility];  Service: General;  Laterality: Left;  . TUBAL LIGATION  2001  . WISDOM TOOTH EXTRACTION      OB History    No data available       Home Medications    Prior to Admission medications   Medication Sig Start Date End Date Taking? Authorizing Provider  HYDROmorphone (DILAUDID) 4 MG tablet Take 4-8 mg by mouth every 2 (two) hours as needed for severe pain.   Yes Historical Provider, MD  methadone (DOLOPHINE) 5 MG tablet Take 5 tablets (25 mg total) by mouth every 8 (eight) hours. 03/16/16  Yes Chauncey Cruel, MD  amphetamine-dextroamphetamine (ADDERALL) 10 MG tablet Take 1 tablet (10 mg total) by mouth 2 (two) times daily with a meal. Patient not taking: Reported on 03/19/2016 02/15/16   Chauncey Cruel, MD  carvedilol (COREG) 12.5 MG tablet Take 1 tablet (12.5 mg total) by mouth 2 (two) times daily with a meal. Patient not taking: Reported on 03/19/2016 01/18/16   Mariel Aloe, MD  lidocaine (LIDODERM) 5 % Place 2 patches onto the  skin daily. Remove & Discard patch within 12 hours or as directed by MD Patient not taking: Reported on 03/19/2016 02/28/16   Chauncey Cruel, MD  LORazepam (ATIVAN) 1 MG tablet Take 1 tablet (1 mg total) by mouth every 8 (eight) hours. Patient not taking: Reported on 03/19/2016 03/01/16   Gery Pray, MD  omeprazole (PRILOSEC) 40 MG capsule Take 1 capsule (40 mg total) by mouth daily. Patient not taking: Reported on 03/19/2016 10/08/15   Chauncey Cruel, MD  prochlorperazine (COMPAZINE) 10 MG tablet Take 1 tablet (10 mg total) by mouth every 6 (six) hours as needed for nausea or vomiting. Patient not taking: Reported on 03/19/2016 02/15/16   Chauncey Cruel, MD  thiamine 100 MG tablet Take 1 tablet (100 mg total) by mouth daily. Patient not taking: Reported on 03/19/2016 01/18/16   Mariel Aloe, MD    Family History  Family History  Problem Relation Age of Onset  . Cancer Paternal Aunt     "bone cancer"; deceased 68s  . Cancer Paternal Uncle     stomach cancer; deceased 55s  . Cancer Paternal Aunt     unk. primary; currently 37s  . Asthma Mother   . Prostate cancer Maternal Grandfather 40    Social History Social History  Substance Use Topics  . Smoking status: Current Some Day Smoker    Packs/day: 0.25    Years: 25.00    Types: Cigarettes  . Smokeless tobacco: Never Used     Comment: 04/23/2014 "using vapor; down to 3-4 cigarettes/day now"  . Alcohol use No     Allergies   Nyquil multi-symptom [pseudoeph-doxylamine-dm-apap] and Allegra-d [fexofenadine-pseudoephed er]   Review of Systems Review of Systems  Unable to perform ROS: Mental status change     Physical Exam Updated Vital Signs BP 97/67 (BP Location: Right Arm)   Pulse 98   Temp 98.3 F (36.8 C) (Oral)   Resp 16   LMP 12/07/2014 Comment: spotting  SpO2 100%   Physical Exam  Constitutional:  Chronically ill-appearing  HENT:  Head: Normocephalic and atraumatic.  Eyes: Pupils are equal, round,  and reactive to light.  Cardiovascular: Regular rhythm.   Faint systolic ejection murmur, tachycardic  Pulmonary/Chest: Effort normal and breath sounds normal. No respiratory distress.  Abdominal:  Mild diffuse abdominal tenderness, hepatomegaly.  Musculoskeletal: She exhibits no edema or tenderness.  Neurological:  Lethargic. Slow to answer questions. Confused. Profound generalized weakness.  Skin: Skin is warm and dry. Capillary refill takes less than 2 seconds.  Psychiatric:  Unable to assess  Nursing note and vitals reviewed.    ED Treatments / Results  Labs (all labs ordered are listed, but only abnormal results are displayed) Labs Reviewed - No data to display  EKG  EKG Interpretation None       Radiology No results found.  Procedures Procedures (including critical care time)  Medications Ordered in ED Medications  HYDROmorphone (DILAUDID) injection 1 mg (1 mg Intravenous Given 03/19/16 1550)     Initial Impression / Assessment and Plan / ED Course  I have reviewed the triage vital signs and the nursing notes.  Pertinent labs & imaging results that were available during my care of the patient were reviewed by me and considered in my medical decision making (see chart for details).  Clinical Course    Patient with metastatic cancer here for evaluation following a fall. She is chronically ill appearing on examination with lethargy and profound weakness. Discussed with patient, family, hospice team patient goals of care. It appears that she has progressive illness at this time and will need total care due to profound weakness and mental status change. Plan to discharge her to [facility] for ongoing care for her metastatic disease.  Final Clinical Impressions(s) / ED Diagnoses   Final diagnoses:  Fall, initial encounter  Metastatic cancer [facility])    New Prescriptions Discharge Medication List as of 03/19/2016  2:01 PM       Quintella Reichert, MD 03/19/16  1807

## 2016-03-19 NOTE — ED Notes (Signed)
Called [facility] to transport pt to [facility]. Pt will go to bed 7.  Note: [facility] contract sts that all [facility] pts must be transported by [employer], not [facility]

## 2016-03-19 NOTE — ED Notes (Signed)
Pt mother was called per pt request. Mother sts that she is en route

## 2016-03-19 NOTE — ED Notes (Signed)
Dr. Ralene Bathe at bedside talking to family

## 2016-03-19 NOTE — Progress Notes (Addendum)
[facility] Visit at 1025am  Met with patient, her mother, and son to discuss patient's recent fall, increased pain, and increased abdominal ascites.  Educated that patient is nearly EOL and is likely at a new functional level.  Family considering returning home with hospice and increased family support vs. transfer to [facility] for ongoing monitoring and symptom management.  Additional family on the way to make that decision.  Please call with any questions/needs.  Thank you, Moss Mc, RN, BSN, [facility] Director of [place] [phone] Cell [phone]

## 2016-03-19 NOTE — ED Notes (Signed)
Bed: [id number] Expected date: 03/19/16 Expected time: 8:26 AM Means of arrival: Ambulance Comments: Fall, Ca pt

## 2016-03-19 NOTE — ED Notes (Signed)
Hospice RN has arrived and is with pt and family at bedside

## 2016-03-19 NOTE — ED Notes (Signed)
Pt family requested that pt receive pain meds prior to transport so that she will be comfortable.

## 2016-03-19 NOTE — ED Notes (Signed)
[place] and gave report to [place], Therapist, sports. Helene Kelp sts that she wishes for [place] to leave the port accessed for their use. Dr Ralene Bathe sts that this is appropriate.

## 2016-03-19 NOTE — Discharge Instructions (Signed)
Go directly to [facility] for further care

## 2016-03-19 NOTE — ED Triage Notes (Signed)
Pt from home via home via EMS- Pt has stage 4 bone caner. Pt was getting out of bed this am when she tripped and fell. Pt c/o hitting her head on floor. Pt sts that she has HA but has no other c/o. Pt stopped chem the end of September. Pt is A&O and in NAD

## 2016-03-20 ENCOUNTER — Other Ambulatory Visit: Payer: Medicare Other

## 2016-03-20 ENCOUNTER — Ambulatory Visit: Payer: Medicare Other

## 2016-03-21 ENCOUNTER — Telehealth: Payer: Self-pay | Admitting: *Deleted

## 2016-03-21 NOTE — Telephone Encounter (Signed)
"  This is Presenter, broadcasting with [employer] calling to notify Dr. Jana Hakim this patient was here but has transferred home at her request with Hospice care in the home."

## 2016-03-22 ENCOUNTER — Telehealth: Payer: Self-pay

## 2016-03-22 ENCOUNTER — Telehealth: Payer: Self-pay | Admitting: *Deleted

## 2016-03-22 MED ORDER — HYDROMORPHONE HCL 4 MG PO TABS: 4.0000 mg | ORAL_TABLET | Freq: Two times a day (BID) | ORAL | 0 refills | Status: AC | PRN

## 2016-03-22 NOTE — Telephone Encounter (Signed)
Nurse from hospice called requesting dilaudid refill for pt. She has gone home from [facility]. S/w Dr Jana Hakim. Rx prepared and faxed to [facility].

## 2016-03-22 NOTE — Telephone Encounter (Signed)
Caregiver called to check on Rx for Dilaudid.  Hospice told her that [facility] in [place] did not have it in stock and Rx has been sent to [facility] in [place], but unsure which [facility] in [place].    Found out from [place] phone number to [facility] [phone] .  Called and s/w pharmacy staff ([facility] on [address]) who says they only have 120 Dilaudid pills in stock and Rx is for #180.  They just got off the phone w/ Melody who told them ok to fill the #120 pills this time.   I called Melody back and confirmed she is ok w/ this and going to pick Rx up today.

## 2016-03-22 NOTE — Telephone Encounter (Signed)
[facility] [place] stated they did not have [facility]audid but [facility] [place] did. They gave phone number, fax number obtained and dilaudid rx faxed to [facility] in [place].

## 2016-03-22 NOTE — Telephone Encounter (Signed)
S/w mother that rx was sent.

## 2016-03-27 ENCOUNTER — Other Ambulatory Visit: Payer: Medicare Other

## 2016-03-27 ENCOUNTER — Ambulatory Visit: Payer: Medicare Other

## 2016-04-05 DEATH — deceased

## 2016-05-13 ENCOUNTER — Other Ambulatory Visit: Payer: Self-pay | Admitting: Nurse Practitioner

## 2018-07-17 IMAGING — CT CT ANGIO CHEST
3 of 9 series · 17 of 36 positions shown · IV contrast (isovue)
Comparison: Chest radiograph performed 01/07/2016, and CT of the
chest performed 08/03/2015

CLINICAL DATA: Acute onset of shortness of breath. Tachycardia and
diaphoresis. Lethargy, with right shoulder pain. Elevated D-dimer.
Initial encounter.

EXAM:
CT ANGIOGRAPHY CHEST WITH CONTRAST
TECHNIQUE: Multidetector CT imaging of the chest was performed using the
standard protocol during bolus administration of intravenous
contrast. Multiplanar CT image reconstructions and MIPs were
obtained to evaluate the vascular anatomy.
CONTRAST:  100 mL of Isovue 370 IV contrast

[Series 7: pe thins @ 1mm · axial · 0.67mm/px · z∈[-238,-34]mm · 14 of 236 slices shown (1 of 2)]
[im 16/236  lung]
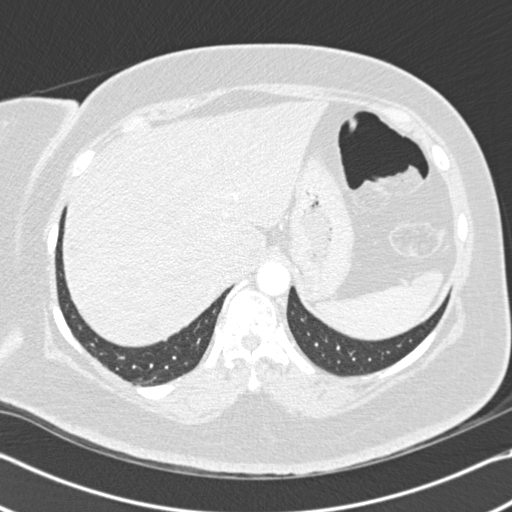
[im 32/236  mediastinal]
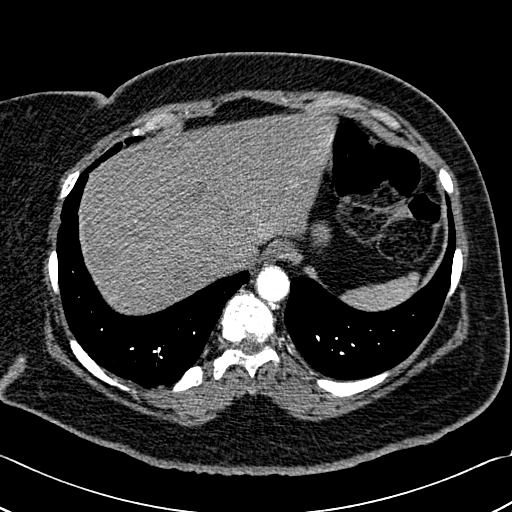
[im 48/236  lung]
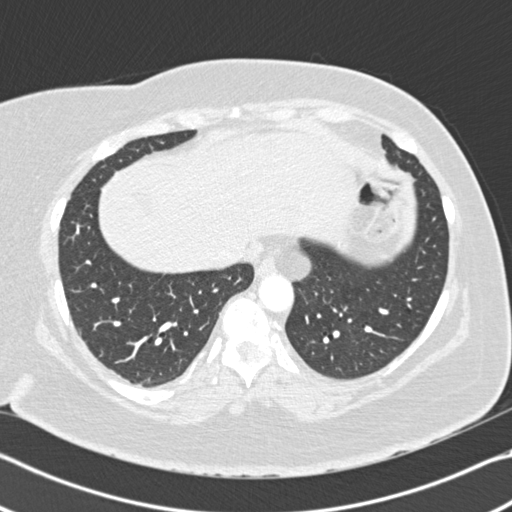
[im 63/236  mediastinal]
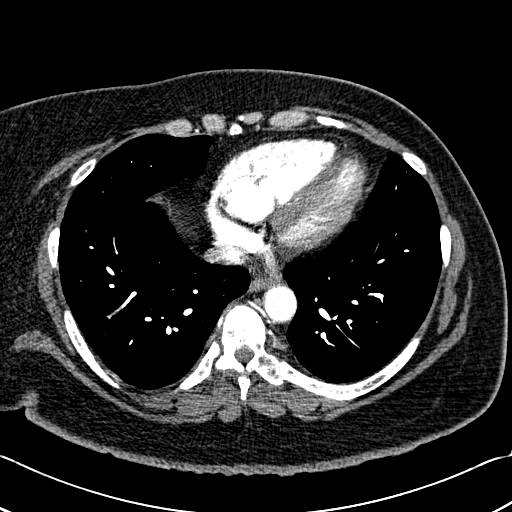
[im 79/236  lung]
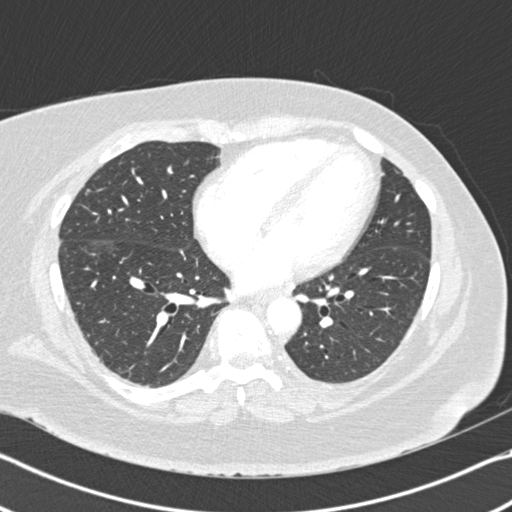
[im 95/236  mediastinal]
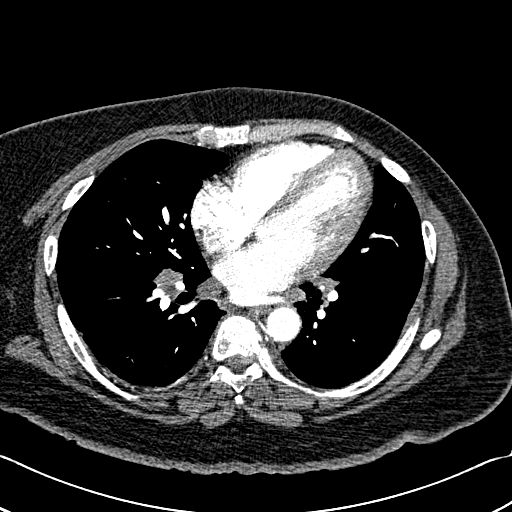
[im 110/236  lung]
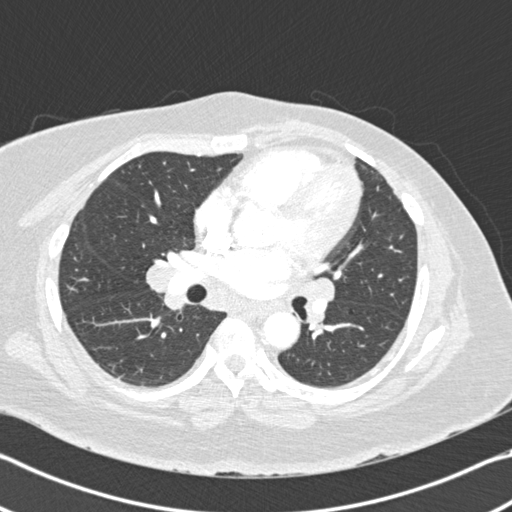
[im 126/236  mediastinal]
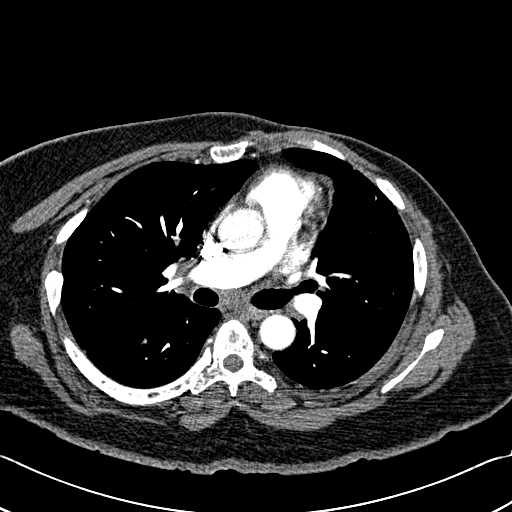
[im 142/236  lung]
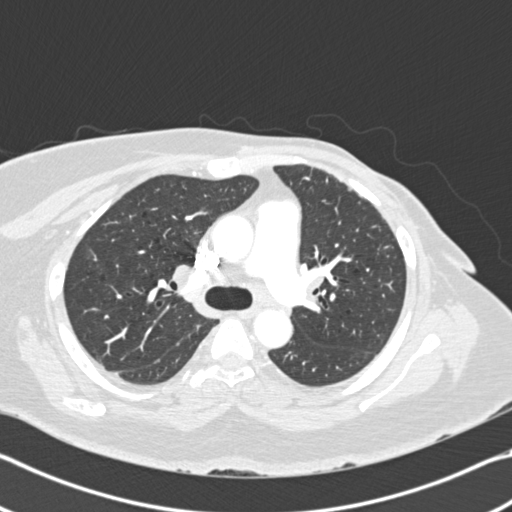
[im 157/236  mediastinal]
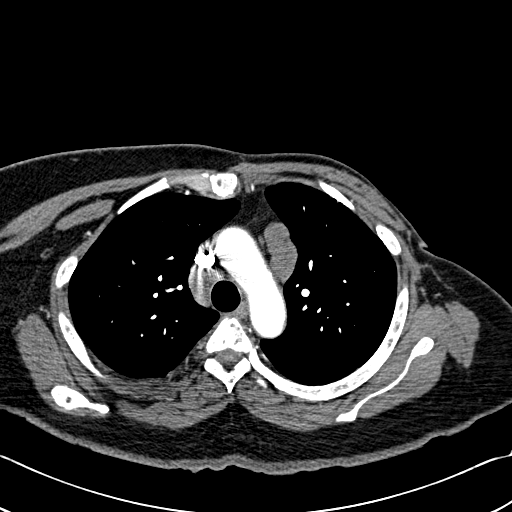
[im 173/236  lung]
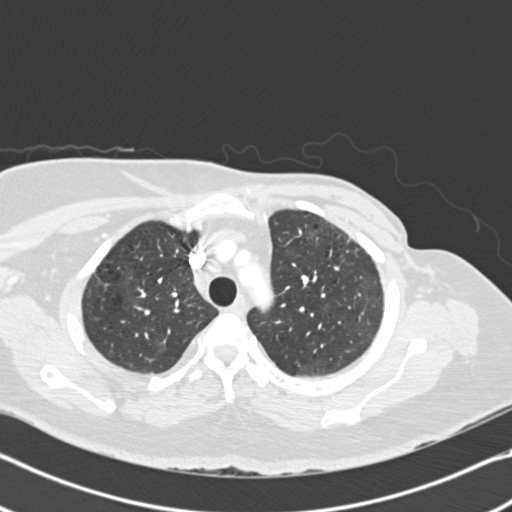
[im 189/236  mediastinal]
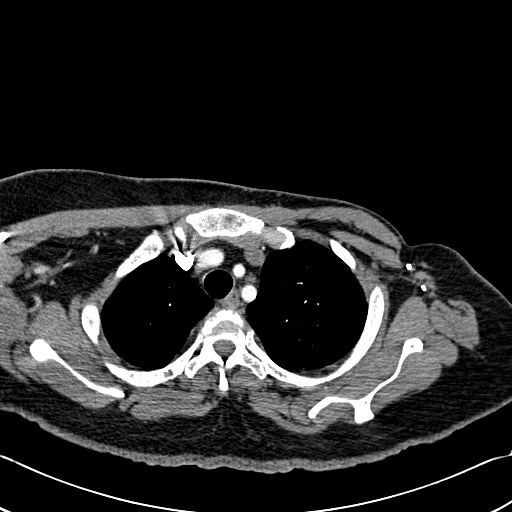
[im 204/236  lung]
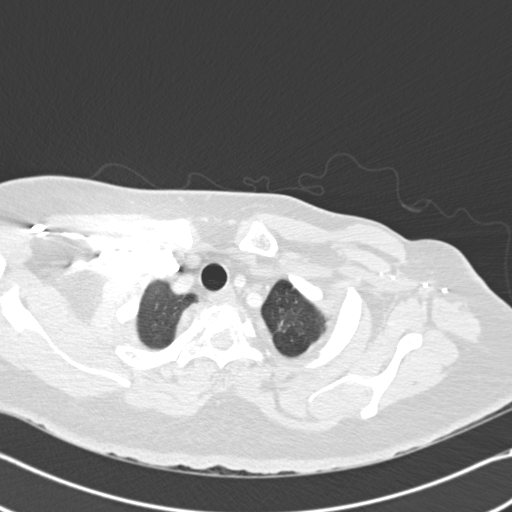
[im 220/236  mediastinal]
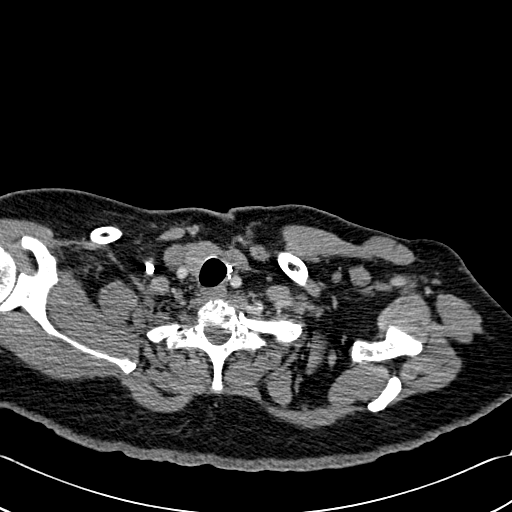

[Series 11: pe thins @ 1mm · axial · 0.67mm/px · z∈[-274,-254]mm · 2 of 58 slices shown (2 of 2)]
[im 20/58  lung]
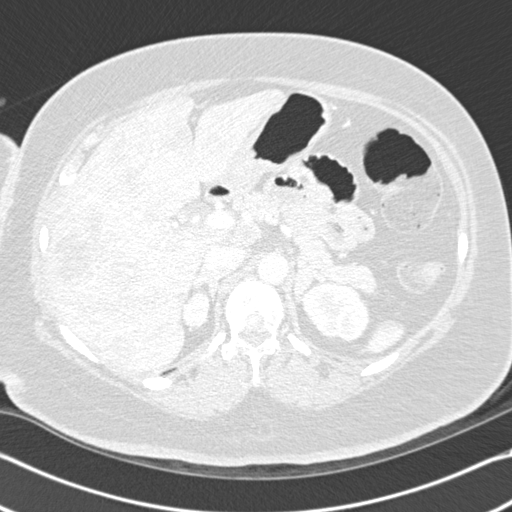
[im 39/58  lung]
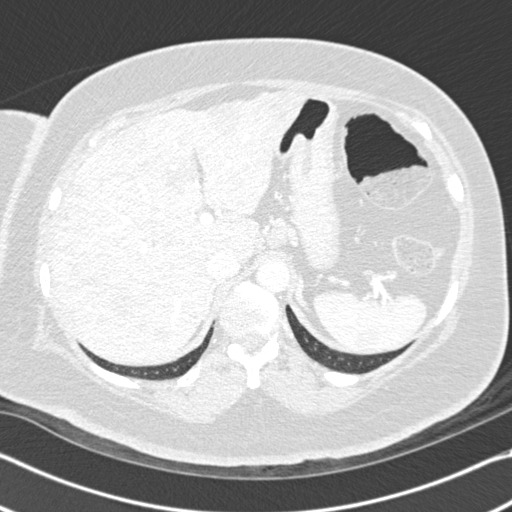

[Series 602: mpr cor · coronal · 0.67mm/px · 1 of 128 slices shown]
[im 64/128  mediastinal]
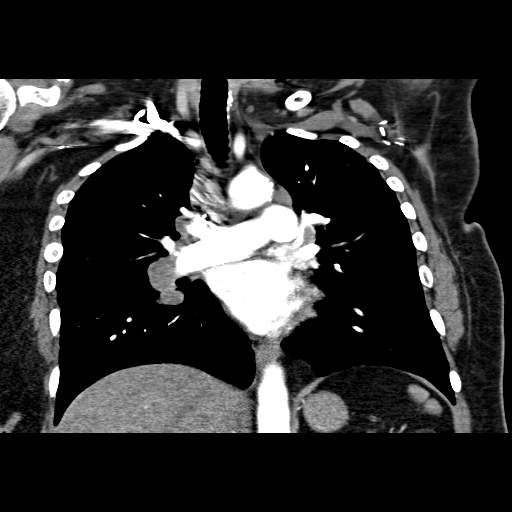

[17 of 36 positions shown; findings below may reference images not displayed]

FINDINGS: There is no evidence of pulmonary embolus.

Minimal bilateral atelectasis is noted. A 7 mm nodule at the left
lung apex is somewhat more prominent than on the prior study.
Metastatic disease cannot be entirely excluded. Scattered blebs are
noted at the lung apices. There is no evidence of significant focal
consolidation, pleural effusion or pneumothorax.

Note is made of enlarged peribronchial, hilar and mediastinal nodes.
The largest peribronchial node is noted extending to the right lower
lobe, measuring 1.9 cm in short axis. Enlarged bilateral hilar nodes
measure up to 1.2 cm in short axis. There is a 1.8 cm
azygoesophageal recess node, confluent periaortic nodes measuring up
to 1.8 cm in short axis, and a right paratracheal node measuring
cm. Per correlation with prior studies, this most likely reflects
sarcoidosis.

No pericardial effusion is identified. The great vessels are grossly
unremarkable in appearance. No axillary lymphadenopathy is seen. The
visualized portions of the thyroid gland are unremarkable in
appearance. Postoperative change is noted at the left thyroid bed.

Numerous vague hypodensities are seen within the liver, raising
concern for metastatic disease, mostly new from the prior study. A
small hypodense focus within the spleen is of uncertain
significance.

No acute osseous abnormalities are seen. Diffuse mixed sclerotic and
lytic lesions are noted throughout the visualized osseous
structures, compatible with metastatic disease. This appears
somewhat worsened from the prior study.

Review of the MIP images confirms the above findings.
IMPRESSION: 1. No evidence of pulmonary embolus.
2. Minimal bilateral atelectasis noted. 7 mm nodule at the left lung
apex is somewhat more prominent than in [REDACTED]. Metastatic disease
cannot be entirely excluded. Scattered blebs noted at the lung
apices.
3. Numerous vague hypodensities within the liver, mostly new from
the prior study and raising concern for new metastatic disease.
Small hypodense focus within the spleen is of uncertain
significance.
4. Enlarged peribronchial, hilar and mediastinal nodes are
relatively stable and likely reflect sarcoidosis.
5. Diffuse metastatic disease to the bone again noted, with mixed
sclerotic and lytic lesions throughout the visualized osseous
structures. This appears somewhat worsened from the prior study.

## 2019-01-08 ENCOUNTER — Encounter: Payer: Self-pay | Admitting: Genetic Counselor

## 2019-01-08 NOTE — Progress Notes (Signed)
UPDATE: NBN p.T452P (c.1354A>C) VUS was amended to Variant, Likely Benign due to internal data.
